# Patient Record
Sex: Female | Born: 1965
Health system: Southern US, Community
[De-identification: ages and names within clinical notes are randomized; demographics above are authoritative.]

## PROBLEM LIST (undated history)

## (undated) DIAGNOSIS — Z91013 Allergy to seafood: Secondary | ICD-10-CM

## (undated) DIAGNOSIS — K859 Acute pancreatitis without necrosis or infection, unspecified: Secondary | ICD-10-CM

## (undated) DIAGNOSIS — IMO0001 Reserved for inherently not codable concepts without codable children: Secondary | ICD-10-CM

## (undated) DIAGNOSIS — N879 Dysplasia of cervix uteri, unspecified: Secondary | ICD-10-CM

## (undated) DIAGNOSIS — G43109 Migraine with aura, not intractable, without status migrainosus: Secondary | ICD-10-CM

## (undated) DIAGNOSIS — R002 Palpitations: Secondary | ICD-10-CM

## (undated) DIAGNOSIS — Z8489 Family history of other specified conditions: Secondary | ICD-10-CM

## (undated) DIAGNOSIS — M839 Adult osteomalacia, unspecified: Secondary | ICD-10-CM

## (undated) DIAGNOSIS — C801 Malignant (primary) neoplasm, unspecified: Secondary | ICD-10-CM

## (undated) DIAGNOSIS — J309 Allergic rhinitis, unspecified: Secondary | ICD-10-CM

## (undated) HISTORY — DX: Palpitations: R00.2

## (undated) HISTORY — DX: Acute pancreatitis without necrosis or infection, unspecified: K85.90

## (undated) HISTORY — DX: Dysplasia of cervix uteri, unspecified: N87.9

## (undated) HISTORY — DX: Allergy to seafood: Z91.013

## (undated) HISTORY — DX: Allergic rhinitis, unspecified: J30.9

## (undated) HISTORY — DX: Migraine with aura, not intractable, without status migrainosus: G43.109

## (undated) HISTORY — PX: FOOT SURGERY: SHX648

## (undated) HISTORY — DX: Malignant (primary) neoplasm, unspecified: C80.1

## (undated) HISTORY — DX: Adult osteomalacia, unspecified: M83.9

## (undated) HISTORY — DX: Reserved for inherently not codable concepts without codable children: IMO0001

## (undated) HISTORY — PX: OTHER SURGICAL HISTORY: SHX169

---

## 1999-03-29 ENCOUNTER — Other Ambulatory Visit: Admission: RE | Admit: 1999-03-29 | Discharge: 1999-03-29 | Payer: Self-pay | Admitting: Gynecology

## 1999-04-04 ENCOUNTER — Encounter: Admission: RE | Admit: 1999-04-04 | Discharge: 1999-07-03 | Payer: Self-pay | Admitting: Gynecology

## 1999-08-09 ENCOUNTER — Encounter: Admission: RE | Admit: 1999-08-09 | Discharge: 1999-11-07 | Payer: Self-pay | Admitting: Gynecology

## 1999-08-30 ENCOUNTER — Inpatient Hospital Stay (HOSPITAL_COMMUNITY): Admission: AD | Admit: 1999-08-30 | Discharge: 1999-09-01 | Payer: Self-pay | Admitting: Gynecology

## 1999-08-31 ENCOUNTER — Encounter: Payer: Self-pay | Admitting: Obstetrics and Gynecology

## 1999-10-24 ENCOUNTER — Inpatient Hospital Stay (HOSPITAL_COMMUNITY): Admission: AD | Admit: 1999-10-24 | Discharge: 1999-10-26 | Payer: Self-pay | Admitting: Gynecology

## 1999-12-13 ENCOUNTER — Other Ambulatory Visit: Admission: RE | Admit: 1999-12-13 | Discharge: 1999-12-13 | Payer: Self-pay | Admitting: Gynecology

## 2000-07-22 ENCOUNTER — Ambulatory Visit (HOSPITAL_BASED_OUTPATIENT_CLINIC_OR_DEPARTMENT_OTHER): Admission: RE | Admit: 2000-07-22 | Discharge: 2000-07-22 | Payer: Self-pay | Admitting: Orthopedic Surgery

## 2000-07-22 ENCOUNTER — Encounter (INDEPENDENT_AMBULATORY_CARE_PROVIDER_SITE_OTHER): Payer: Self-pay | Admitting: *Deleted

## 2000-12-30 ENCOUNTER — Other Ambulatory Visit: Admission: RE | Admit: 2000-12-30 | Discharge: 2000-12-30 | Payer: Self-pay | Admitting: Gynecology

## 2001-11-03 ENCOUNTER — Encounter: Payer: Self-pay | Admitting: Family Medicine

## 2001-11-03 ENCOUNTER — Encounter: Admission: RE | Admit: 2001-11-03 | Discharge: 2001-11-03 | Payer: Self-pay | Admitting: Family Medicine

## 2002-01-12 ENCOUNTER — Other Ambulatory Visit: Admission: RE | Admit: 2002-01-12 | Discharge: 2002-01-12 | Payer: Self-pay | Admitting: Gynecology

## 2002-07-07 ENCOUNTER — Encounter: Admission: RE | Admit: 2002-07-07 | Discharge: 2002-07-07 | Payer: Self-pay | Admitting: Family Medicine

## 2002-07-07 ENCOUNTER — Encounter: Payer: Self-pay | Admitting: Family Medicine

## 2002-11-12 ENCOUNTER — Encounter: Admission: RE | Admit: 2002-11-12 | Discharge: 2002-11-12 | Payer: Self-pay | Admitting: Family Medicine

## 2002-11-12 ENCOUNTER — Encounter: Payer: Self-pay | Admitting: Family Medicine

## 2003-02-03 ENCOUNTER — Other Ambulatory Visit: Admission: RE | Admit: 2003-02-03 | Discharge: 2003-02-03 | Payer: Self-pay | Admitting: Gynecology

## 2004-02-16 ENCOUNTER — Other Ambulatory Visit: Admission: RE | Admit: 2004-02-16 | Discharge: 2004-02-16 | Payer: Self-pay | Admitting: Gynecology

## 2005-02-27 ENCOUNTER — Other Ambulatory Visit: Admission: RE | Admit: 2005-02-27 | Discharge: 2005-02-27 | Payer: Self-pay | Admitting: Gynecology

## 2005-03-29 ENCOUNTER — Encounter: Admission: RE | Admit: 2005-03-29 | Discharge: 2005-03-29 | Payer: Self-pay | Admitting: Gynecology

## 2005-08-26 ENCOUNTER — Other Ambulatory Visit: Admission: RE | Admit: 2005-08-26 | Discharge: 2005-08-26 | Payer: Self-pay | Admitting: Gynecology

## 2005-09-23 DIAGNOSIS — N879 Dysplasia of cervix uteri, unspecified: Secondary | ICD-10-CM

## 2005-09-23 HISTORY — DX: Dysplasia of cervix uteri, unspecified: N87.9

## 2006-02-28 ENCOUNTER — Other Ambulatory Visit: Admission: RE | Admit: 2006-02-28 | Discharge: 2006-02-28 | Payer: Self-pay | Admitting: Gynecology

## 2006-07-07 ENCOUNTER — Ambulatory Visit (HOSPITAL_COMMUNITY): Admission: RE | Admit: 2006-07-07 | Discharge: 2006-07-07 | Payer: Self-pay | Admitting: Family Medicine

## 2006-07-18 ENCOUNTER — Ambulatory Visit (HOSPITAL_COMMUNITY): Admission: RE | Admit: 2006-07-18 | Discharge: 2006-07-18 | Payer: Self-pay | Admitting: Family Medicine

## 2007-03-02 ENCOUNTER — Other Ambulatory Visit: Admission: RE | Admit: 2007-03-02 | Discharge: 2007-03-02 | Payer: Self-pay | Admitting: Gynecology

## 2007-10-02 ENCOUNTER — Other Ambulatory Visit: Admission: RE | Admit: 2007-10-02 | Discharge: 2007-10-02 | Payer: Self-pay | Admitting: Gynecology

## 2007-10-09 ENCOUNTER — Ambulatory Visit (HOSPITAL_COMMUNITY): Admission: RE | Admit: 2007-10-09 | Discharge: 2007-10-09 | Payer: Self-pay | Admitting: Gynecology

## 2007-10-27 ENCOUNTER — Emergency Department (HOSPITAL_COMMUNITY): Admission: EM | Admit: 2007-10-27 | Discharge: 2007-10-27 | Payer: Self-pay | Admitting: Emergency Medicine

## 2008-03-29 ENCOUNTER — Other Ambulatory Visit: Admission: RE | Admit: 2008-03-29 | Discharge: 2008-03-29 | Payer: Self-pay | Admitting: Gynecology

## 2008-11-07 ENCOUNTER — Encounter
Admission: RE | Admit: 2008-11-07 | Discharge: 2008-11-24 | Payer: Self-pay | Admitting: Physical Medicine & Rehabilitation

## 2008-11-08 ENCOUNTER — Ambulatory Visit: Payer: Self-pay | Admitting: Physical Medicine & Rehabilitation

## 2008-11-18 ENCOUNTER — Encounter: Admission: RE | Admit: 2008-11-18 | Discharge: 2008-11-18 | Payer: Self-pay | Admitting: Gynecology

## 2008-11-24 ENCOUNTER — Ambulatory Visit: Payer: Self-pay | Admitting: Physical Medicine & Rehabilitation

## 2009-02-14 ENCOUNTER — Encounter
Admission: RE | Admit: 2009-02-14 | Discharge: 2009-02-16 | Payer: Self-pay | Admitting: Physical Medicine & Rehabilitation

## 2009-02-16 ENCOUNTER — Ambulatory Visit: Payer: Self-pay | Admitting: Physical Medicine & Rehabilitation

## 2009-03-30 ENCOUNTER — Ambulatory Visit: Payer: Self-pay | Admitting: Gynecology

## 2009-03-30 ENCOUNTER — Encounter: Payer: Self-pay | Admitting: Gynecology

## 2009-03-30 ENCOUNTER — Other Ambulatory Visit: Admission: RE | Admit: 2009-03-30 | Discharge: 2009-03-30 | Payer: Self-pay | Admitting: Gynecology

## 2009-05-25 ENCOUNTER — Encounter
Admission: RE | Admit: 2009-05-25 | Discharge: 2009-05-30 | Payer: Self-pay | Admitting: Physical Medicine & Rehabilitation

## 2009-05-30 ENCOUNTER — Ambulatory Visit: Payer: Self-pay | Admitting: Physical Medicine & Rehabilitation

## 2009-06-16 ENCOUNTER — Encounter (INDEPENDENT_AMBULATORY_CARE_PROVIDER_SITE_OTHER): Payer: Self-pay | Admitting: Orthopedic Surgery

## 2009-06-16 ENCOUNTER — Ambulatory Visit (HOSPITAL_BASED_OUTPATIENT_CLINIC_OR_DEPARTMENT_OTHER): Admission: RE | Admit: 2009-06-16 | Discharge: 2009-06-16 | Payer: Self-pay | Admitting: Orthopedic Surgery

## 2009-06-19 ENCOUNTER — Ambulatory Visit (HOSPITAL_COMMUNITY): Admission: RE | Admit: 2009-06-19 | Discharge: 2009-06-19 | Payer: Self-pay | Admitting: Family Medicine

## 2009-11-17 ENCOUNTER — Encounter
Admission: RE | Admit: 2009-11-17 | Discharge: 2009-11-21 | Payer: Self-pay | Admitting: Physical Medicine & Rehabilitation

## 2009-11-20 ENCOUNTER — Ambulatory Visit (HOSPITAL_COMMUNITY): Admission: RE | Admit: 2009-11-20 | Discharge: 2009-11-20 | Payer: Self-pay | Admitting: Gynecology

## 2009-11-21 ENCOUNTER — Ambulatory Visit: Payer: Self-pay | Admitting: Physical Medicine & Rehabilitation

## 2010-04-03 ENCOUNTER — Other Ambulatory Visit: Admission: RE | Admit: 2010-04-03 | Discharge: 2010-04-03 | Payer: Self-pay | Admitting: Gynecology

## 2010-04-03 ENCOUNTER — Ambulatory Visit: Payer: Self-pay | Admitting: Gynecology

## 2010-04-09 ENCOUNTER — Ambulatory Visit: Payer: Self-pay | Admitting: Gynecology

## 2010-10-14 ENCOUNTER — Encounter: Payer: Self-pay | Admitting: Family Medicine

## 2010-10-18 ENCOUNTER — Ambulatory Visit
Admission: RE | Admit: 2010-10-18 | Discharge: 2010-10-18 | Payer: Self-pay | Source: Home / Self Care | Attending: Gynecology | Admitting: Gynecology

## 2010-11-29 ENCOUNTER — Other Ambulatory Visit: Payer: Self-pay | Admitting: Gynecology

## 2010-11-29 DIAGNOSIS — Z1231 Encounter for screening mammogram for malignant neoplasm of breast: Secondary | ICD-10-CM

## 2010-12-12 ENCOUNTER — Ambulatory Visit (HOSPITAL_COMMUNITY)
Admission: RE | Admit: 2010-12-12 | Discharge: 2010-12-12 | Disposition: A | Discharge status: Home / Self Care | Payer: Managed Care, Other (non HMO) | Source: Ambulatory Visit | Attending: Gynecology | Admitting: Gynecology

## 2010-12-12 DIAGNOSIS — Z1231 Encounter for screening mammogram for malignant neoplasm of breast: Secondary | ICD-10-CM | POA: Insufficient documentation

## 2011-02-05 NOTE — Consult Note (Signed)
CONSULT REQUESTED:  Tinnie Gens A. Petrinitz, DPM   REASON FOR CONSULTATION:  Consult requested for the evaluation of left  foot pain.   HISTORY:  A 45 year old female who was walking down steps on June 03, 2008, when she inverted her left ankle and had immediate onset of  pain.  She had what sounds like a left fifth metatarsal spiral fracture,  was seen by Orthopedics, placed in a Cam Walker boot for 6-8 weeks and  then a cast shoe for 2 more months.  She still has some swelling when  she is up on it for a long time.  Her main question is why she continues  to have some burning pain as well as constant aching, which is on the  lateral border of her left foot extending into her little toe.  Her pain  is worse toward the end of the day when she has been up on a lot.  Pain  is worse with walking, standing.  She denies any weakness in lower  extremity or any back pain.  She is employed 40 hours a week and has 4  children.   REVIEW OF SYSTEMS:  Positive for numbness and trouble walking.  She has  also seen Podiatry for this.   PAST MEDICAL HISTORY:  Significant for melanoma.   SOCIAL HISTORY:  She is married.  No alcohol or drug abuse.   FAMILY HISTORY:  Unremarkable.   PHYSICAL EXAMINATION:  VITAL SIGNS:  Blood pressure is 104/56, pulse 66,  respirations 18, O2 sat 100% on room air.  GENERAL:  No acute stress.  Orientation x3.  Affect alert.  Gait is  normal.  EXTREMITIES:  She is able heel walk and toe walk.  Extremities show no  evidence edema.  She has some mild tenderness to palpation but only with  more firm pressure on the fifth metatarsal only.  She has no signs of  vasomotor changes.  No nailbed changes.  Her pulses are normal in the  dorsalis pedis and posterior tibialis.  She has mildly reduced sensation  to pinprick over the lateral border of the left foot as well as left  toe.  She has also some mild reduction of sensation on the dorsum of the  foot on the left side  versus the right side.  She has no signs of  intrinsic atrophy.  She has pain with passive inversion of the left  ankle.   IMPRESSION:  Inversion injury with left ankle with paresthesias and  dysesthesias.  I suspect that she has had a stretch injury of a sural  nerve and possibly a superficial peroneal, does not appear to be in the  deep peroneal.  We will need to check EMG and CV to further assess.  If  the nerve conduction studies are normal, we would look into a  tendinopathy or strain as cause for continuing discomfort.   We discussed medication management including anticonvulsants and topical  agents.  She really feels like she could be with pain.  She really wants  to just see what it is and what else can be done for it.   I discussed her treatment plan and she is in agreement.      Erick Colace, M.D.  Electronically Signed     AEK/MedQ  D:11/08/2008 17:44:28  T:11/09/2008 02:11:04  Job #:  [id number]   cc:   Tinnie Gens A. Petrinitz, D.P.M.  Fax: [phone]   Marlowe Kays, M.D.  Fax: [phone]

## 2011-02-05 NOTE — Assessment & Plan Note (Signed)
[name] is a 45 year old female walking down steps on June 03, 2008.  She injured her left ankle with immediate onset of pain.  Fifth metatarsal spiral fracture placed in a cam walker boot 6-8 weeks,  cast shoe for 2 more months.  She continues to have some pain and  aching, lateral border of the foot and to the low toe.  She had an  EMG/NCV, which showed a low amplitude on the left sural compared to  right sural, approximately 50% decreased, even though it fell within the  lower limit of normal.  No peroneal motor effect and no superficial  peroneal effect abnormalities.   She has been doing relatively well on a Lidoderm. 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  She did not fill the Neurontin, really did not want  anything back that could cloud her mind.  She feels that she needs a  broader coverage area for her Lidoderm.  Her average pain is of 7, but  only 4 right now, interferes with activity at a moderate level.  Improves with ice and TENS.  Relief from meds is good.   PHYSICAL EXAMINATION:  VITAL SIGNS:  Her blood pressure is 117/72, pulse  79, respirations 80, and O2 sat 98% on room air.  Her Oswestry score is  26%.  GENERAL:  Well-developed and well-nourished female in no acute stress.  Orientation x3.  Affect is bright and alert.  Gait is normal.  EXTREMITIES:  She is able toe walk and heel walk.  She has no tenderness  to palpation over the foot.  Good pulses.  No skin breakdown.  Good  strength in the foot evertors and invertors, ankle dorsiflexors, and  plantar flexors.  She has mild deficits to pinprick in the lateral  border of the left foot compared to the right foot, not extending into  little toe.  No significant difference on the plantar surface of the  foot.  No deficits in the first dorsal web space.   IMPRESSION:  Sterile neuropathy, due to inversion injury overall except  recovery over  time.  She is still in the time frame where spontaneous  recovery may occur.   PLAN:  1. We will increase her Lidoderm to 2 patches to provide adequate      coverage.  2. I will see her back at the 1-year mark in September 2010 and      consider repeat EMG/NCV, if no significant improvement clinically.      Erick Colace, M.D.  Electronically Signed     AEK/MedQ  D:  02/16/2009 09:50:36  T:  02/16/2009 23:38:59  Job #:  [id number]   cc:   Tinnie Gens A. Petrinitz, D.P.M.  Fax: [phone]

## 2011-02-08 NOTE — Op Note (Signed)
[facility]  Patient:    Laurie Terry, Laurie Terry                  MRN: [id number] Proc. Date: 07/22/00 Adm. Date:  [id number] Attending:  Ronne Binning CC:         Nicki Reaper, M.D.   Operative Report  PREOPERATIVE DIAGNOSIS:  Pigmented area, left index finger.  POSTOPERATIVE DIAGNOSIS:  Pigmented area, left index finger.  OPERATION PERFORMED:  Biopsy nail matrix, left index finger.  SURGEON:  Nicki Reaper, M.D.  ASSISTANTCarolyne Fiscal, RN.  ANESTHESIA:  Metacarpal block.  INDICATIONS FOR PROCEDURE:  The patient is a 45 year old female who was seen with a pigmented area in her left index finger which has not grown out. It is the full length of the nail plate. She has a prior history of having had a similar lesion on her toe which was biopsied and found to be premelanoma.  She is extremely concerned that this may be a similar process.  DESCRIPTION OF PROCEDURE:  The patient was brought to the operating room where a metacarpal block was given with 1% Xylocaine without epinephrine.  She was prepped and draped using Betadine scrub and solution with the left arm free. A Penrose drain was used for a tourniquet control at the base of the finger. The nail plate was removed.  Minimal pigmentation was noted.  An area of the pigmentation in the nail plate was then replaced onto the nail matrix and a biopsy taken longitudinally of the entire nail matrix.  The nail fold was thoroughly inspected.  No significant pigmented areas were identified.  The wound was then closed with interrupted 6-0 chromic sutures.  The nail plate was reapproximated.  The specimen was sent to pathology.  A sterile compressive dressing was applied. The patient tolerated the procedure well adn was discharged home to return to the [facility] [place] in one week on Advil. DD:  07/22/00 TD:  07/22/00 Job: [id number]

## 2011-02-08 NOTE — Assessment & Plan Note (Signed)
Date of last visit Feb 16, 2009.   Laurie Terry returns today.  She is a 45 year old female, who had a  left fifth metatarsal spiral fracture, placed in a cam walker boot,  continues to have burning pain.  She had an EMG/NCV demonstrating  reduced amplitude of the sural response on the left side when compared  to the right side.  She had a greater than 50% decrement.  Remainder of  nerve testing was normal.   She has had no other new medical problems.  She remains functional with  Lidoderm patches, used during the day; however, when she has increased  activity such as when she is traveling, she takes some ibuprofen and ice  for her foot as well.  Her relief from meds is fair to good.   Her blood pressure is 112/71, pulse 75, respirations 18, O2 sat 97% on  room air.  General, no acute stress.  Orientation x3.  Affect alert.  Gait is normal.  She has mild reduction of sensation to light touch as  well as pinprick in the left lateral foot including the left little toe  and to lesser extent the third and fourth toes.  No sensory deficits on  the dorsum of the foot.  She has normal strength in the lower  extremities.  Normal deep tendon reflexes in the lower extremities.  No  evidence of intrinsic atrophy.   IMPRESSION:  Sural neuropathy, chronic.  She is about 1 year post  injury.  I explained that she may have some spontaneous recovery over  the next 6 months and I will see her then should she continue to have  similar symptoms as she does today, we will recheck sural nerve  conduction to see if there is any electrodiagnostic evidence of  improvement.  Also, consider addition of oral agent such as Neurontin on  the days when she has increased activity, although she is concerned that  sedation side effects may interfere with her job.      Erick Colace, M.D.  Electronically Signed     AEK/MedQ  D:  05/30/2009 09:05:57  T:  05/30/2009 22:51:11  Job #:  [id number]   cc:    Tinnie Gens A. Petrinitz, D.P.M.  Fax: [phone]

## 2011-02-08 NOTE — Discharge Summary (Signed)
[facility]  Patient:    Laurie Terry                   MRN: [id number] Adm. Date:  [id number] Disc. Date: [id number] Attending:  Merrily Pew                           Discharge Summary  DISCHARGE DIAGNOSES:          1. Normal spontaneous vaginal delivery at 39 weeks of                                  female infant.                               2. History of precipitous labor and deliveries.  HISTORY OF PRESENT ILLNESS:   Patient is a 45 year old gravida 4, para 3-0-0-0,  with an EDC of October 29, 1999.  She has a history of precipitous labor and deliveries.  LABORATORY DATA:              She is A-positive.  Antibody screen was negative.  She is not immune to rubella.  MSAFP was within normal limits and she was positive for group B strep.  HOSPITAL COURSE AND TREATMENT:  Patient was admitted at 39 weeks in advanced labor. She delivered an Apgar 8/9 female infant weighing 8 pounds, with a second degree midline laceration.  The placenta was spontaneous and intact.  Postpartum course was uncomplicated.  She remained afebrile and had no difficulty voiding.  She was given rubella vaccine prior to discharge and was discharged on her second postpartum day.  DISPOSITION AND FOLLOWUP:     She is to follow up in the office in four to six weeks, continue with prenatal vitamins and Motrin p.r.n. as needed for pain. DD:  12/03/99 TD:  12/03/99 Job: [id number]

## 2011-04-09 ENCOUNTER — Other Ambulatory Visit (HOSPITAL_COMMUNITY)
Admission: RE | Admit: 2011-04-09 | Discharge: 2011-04-09 | Disposition: A | Discharge status: Home / Self Care | Payer: 59 | Source: Ambulatory Visit | Attending: Gynecology | Admitting: Gynecology

## 2011-04-09 ENCOUNTER — Other Ambulatory Visit: Payer: Self-pay | Admitting: Gynecology

## 2011-04-09 ENCOUNTER — Encounter (INDEPENDENT_AMBULATORY_CARE_PROVIDER_SITE_OTHER): Payer: 59 | Admitting: Gynecology

## 2011-04-09 DIAGNOSIS — Z01419 Encounter for gynecological examination (general) (routine) without abnormal findings: Secondary | ICD-10-CM

## 2011-04-09 DIAGNOSIS — Z833 Family history of diabetes mellitus: Secondary | ICD-10-CM

## 2011-04-09 DIAGNOSIS — Z13220 Encounter for screening for lipoid disorders: Secondary | ICD-10-CM

## 2011-04-09 DIAGNOSIS — Z124 Encounter for screening for malignant neoplasm of cervix: Secondary | ICD-10-CM | POA: Insufficient documentation

## 2011-06-14 LAB — I-STAT 8, (EC8 V) (CONVERTED LAB)
BUN: 10
Bicarbonate: 23.6
Chloride: 106
HCT: 44
Hemoglobin: 15
Sodium: 137
pCO2, Ven: 33.7 — ABNORMAL LOW

## 2011-06-14 LAB — POCT CARDIAC MARKERS
CKMB, poc: <1 — ABNORMAL LOW
Operator id: 285491

## 2011-06-14 LAB — POCT I-STAT CREATININE: Creatinine, Ser: 0.8

## 2011-06-14 LAB — B-NATRIURETIC PEPTIDE (CONVERTED LAB): Pro B Natriuretic peptide (BNP): 33

## 2011-11-18 ENCOUNTER — Other Ambulatory Visit: Payer: Self-pay | Admitting: Gynecology

## 2011-11-18 DIAGNOSIS — Z1231 Encounter for screening mammogram for malignant neoplasm of breast: Secondary | ICD-10-CM

## 2011-12-13 ENCOUNTER — Ambulatory Visit (HOSPITAL_COMMUNITY)
Admission: RE | Admit: 2011-12-13 | Discharge: 2011-12-13 | Disposition: A | Discharge status: Home / Self Care | Payer: 59 | Source: Ambulatory Visit | Attending: Gynecology | Admitting: Gynecology

## 2011-12-13 DIAGNOSIS — Z1231 Encounter for screening mammogram for malignant neoplasm of breast: Secondary | ICD-10-CM | POA: Insufficient documentation

## 2011-12-18 ENCOUNTER — Other Ambulatory Visit: Payer: Self-pay | Admitting: *Deleted

## 2011-12-18 DIAGNOSIS — N63 Unspecified lump in unspecified breast: Secondary | ICD-10-CM

## 2011-12-20 ENCOUNTER — Other Ambulatory Visit: Payer: Self-pay | Admitting: Gynecology

## 2011-12-20 ENCOUNTER — Ambulatory Visit
Admission: RE | Admit: 2011-12-20 | Discharge: 2011-12-20 | Disposition: A | Discharge status: Home / Self Care | Payer: 59 | Source: Ambulatory Visit | Attending: Gynecology | Admitting: Gynecology

## 2011-12-20 DIAGNOSIS — N63 Unspecified lump in unspecified breast: Secondary | ICD-10-CM

## 2012-04-13 ENCOUNTER — Ambulatory Visit (INDEPENDENT_AMBULATORY_CARE_PROVIDER_SITE_OTHER): Payer: 59 | Admitting: Gynecology

## 2012-04-13 ENCOUNTER — Encounter: Payer: Self-pay | Admitting: Gynecology

## 2012-04-13 VITALS — BP 116/74 | Ht 65.0 in | Wt 156.0 lb

## 2012-04-13 DIAGNOSIS — C801 Malignant (primary) neoplasm, unspecified: Secondary | ICD-10-CM | POA: Insufficient documentation

## 2012-04-13 DIAGNOSIS — Z01419 Encounter for gynecological examination (general) (routine) without abnormal findings: Secondary | ICD-10-CM

## 2012-04-13 DIAGNOSIS — M8580 Other specified disorders of bone density and structure, unspecified site: Secondary | ICD-10-CM | POA: Insufficient documentation

## 2012-04-13 NOTE — Progress Notes (Signed)
Laurie Terry Settle 12-05-1965 [id number]        46 y.o.  [id number] for annual exam.  Doing well without complaints.  Past medical history,surgical history, medications, allergies, family history and social history were all reviewed and documented in the EPIC chart. ROS:  Was performed and pertinent positives and negatives are included in the history.  Exam: Kim assistant Filed Vitals:   04/13/12 1156  BP: 116/74  Height: 5\' 5"  (1.651 m)  Weight: 156 lb (70.761 kg)   General appearance  Normal Skin grossly normal Head/Neck normal with no cervical or supraclavicular adenopathy thyroid normal Lungs  clear Cardiac RR, without RMG Abdominal  soft, nontender, without masses, organomegaly or hernia Breasts  examined lying and sitting without masses, retractions, discharge or axillary adenopathy. Pelvic  Ext/BUS/vagina  normal   Cervix  normal IUD string visualized  Uterus  anteverted, normal size, shape and contour, midline and mobile nontender   Adnexa  Without masses or tenderness    Anus and perineum  normal   Rectovaginal  normal sphincter tone without palpated masses or tenderness.    Assessment/Plan:  46 y.o. [id number] female for annual exam, amenorrheic with IUD.   1. Mirena IUD. Patient is at the 5 year mark now and needs to have it replaced. Patient knows this and will schedule removal and replacement. Patient understands pregnancy risk if she goes past the time of replacement.  She is amenorrheic with her IUD as she always has been. 2. Patient had mammography March 2013. She'll continue with annual mammography. SBE monthly reviewed. 3. Pap smear. Last Pap smear 2012. No Pap smear done today. Patient has history of low-grade SIL 2007 no treatment rendered and all subsequent Pap smears have been normal. We'll plan less frequent screening every 3-5 years per current screening guidelines. 4. Health maintenance. No blood work was done today. She had a normal lipid profile, glucose and  CBC last year 2012. 5.     Dara Lords MD, 12:46 PM 04/13/2012

## 2012-04-13 NOTE — Patient Instructions (Signed)
Follow up to have Mirena IUD replaced. Otherwise follow up in one year for annual gynecologic exam.

## 2012-04-14 ENCOUNTER — Other Ambulatory Visit: Payer: Self-pay | Admitting: Gynecology

## 2012-04-14 ENCOUNTER — Telehealth: Payer: Self-pay | Admitting: Gynecology

## 2012-04-14 DIAGNOSIS — Z3049 Encounter for surveillance of other contraceptives: Secondary | ICD-10-CM

## 2012-04-14 LAB — URINALYSIS W MICROSCOPIC + REFLEX CULTURE
Bacteria, UA: NONE SEEN
Crystals: NONE SEEN
Glucose, UA: NEGATIVE mg/dL
Ketones, ur: NEGATIVE mg/dL
Leukocytes, UA: NEGATIVE
Nitrite: NEGATIVE
Specific Gravity, Urine: 1.01 (ref 1.005–1.030)
Urobilinogen, UA: 0.2 mg/dL (ref 0.0–1.0)
pH: 7 (ref 5.0–8.0)

## 2012-04-14 MED ORDER — LEVONORGESTREL 20 MCG/24HR IU IUD: INTRAUTERINE_SYSTEM | Freq: Once | INTRAUTERINE | Status: DC

## 2012-04-14 NOTE — Telephone Encounter (Signed)
Patient informed that Mirena IUD, removal and insertion covered with a $25 copayment. She said she will call back to schedule appointment as she was on a call at the time I spoke with her.

## 2012-05-27 ENCOUNTER — Ambulatory Visit: Payer: 59 | Admitting: Gynecology

## 2012-06-03 ENCOUNTER — Ambulatory Visit (INDEPENDENT_AMBULATORY_CARE_PROVIDER_SITE_OTHER): Payer: 59 | Admitting: Gynecology

## 2012-06-03 ENCOUNTER — Encounter: Payer: Self-pay | Admitting: Gynecology

## 2012-06-03 VITALS — BP 104/68

## 2012-06-03 DIAGNOSIS — Z30433 Encounter for removal and reinsertion of intrauterine contraceptive device: Secondary | ICD-10-CM

## 2012-06-03 NOTE — Progress Notes (Signed)
Patient presents for removal and replacement of Mirena IUD. She has read through the booklet, has no contraindications and signed the consent form.  I reviewed the insertional process with her as well as the risks to include infection either immediate or long-term, uterine perforation or migration requiring surgery to remove, other complications such as pain, hormonal side effects and possibilities of failure with subsequent pregnancy.   Exam with [facility] assistant Pelvic: External BUS vagina normal. Cervix normal IUD string visualized. Uterus anteverted normal size shape contour midline mobile nontender. Adnexa without masses or tenderness.  Procedure: The cervix was cleansed with Hibiclens, the IUD string was grasped with a [place] forcep and the old Mirena IUD was removed shown to the patient and discarded.  The anterior lip of the cervix was grasped with a single-tooth tenaculum, the uterus was sounded and a Mirena IUD was placed according to manufacturer's recommendations without difficulty. The strings were trimmed. The patient tolerated well and will follow up in one month for a postinsertional check.  Lot number: [id number]

## 2012-06-03 NOTE — Patient Instructions (Signed)
Intrauterine Device Insertion Most often, an intrauterine device (IUD) is inserted into the uterus to prevent pregnancy. There are 2 types of IUDs available:  Copper IUD. This type of IUD creates an environment that is not favorable to sperm survival. The mechanism of action of the copper IUD is not known for certain. It can stay in place for 10 years.   Hormone IUD. This type of IUD contains the hormone progestin (synthetic progesterone). The progestin thickens the cervical mucus and prevents sperm from entering the uterus, and it also thins the uterine lining. There is no evidence that the hormone IUD prevents implantation. The hormone IUD can stay in place for up to 5 years.  An IUD is the most cost-effective birth control if left in place for the full duration. It may be removed at any time. LET YOUR CAREGIVER KNOW ABOUT:  Sensitivity to metals.   Medicines taken including herbs, eyedrops, over-the-counter medicines, and creams.   Use of steroids (by mouth or creams).   Previous problems with anesthetics or numbing medicine.   Previous gynecological surgery.   History of blood clots or clotting disorders.   Possibility of pregnancy.   Menstrual irregularities.   Concerns regarding unusual vaginal discharge or odors.   Previous experience with an IUD.   Other health problems.  RISKS AND COMPLICATIONS  Accidental puncture (perforation) of the uterus.   Accidental placement of the IUD either in the muscle layer of the uterus (myometrium) or outside the uterus. If this happen, the IUD can be found essentially floating around the bowels. When this happens, the IUD must be taken out surgically.   The IUD may fall out of the uterus (expulsion). This is more common in women who have recently had a child.    Pregnancy in the fallopian tube (ectopic).  BEFORE THE PROCEDURE  Schedule the IUD insertion for when you will have your menstrual period or right after, to make sure you  are not pregnant. Placement of the IUD is better tolerated shortly after a menstrual cycle.   You may need to take tests or be examined to make sure you are not pregnant.   You may be required to take a pregnancy test.   You may be required to get checked for sexually transmitted infections (STIs) prior to placement. Placing an IUD in someone who has an infection can make an infection worse.   You may be given a pain reliever to take 1 or 2 hours before the procedure.   An exam will be performed to determine the size and position of your uterus.   Ask your caregiver about changing or stopping your regular medicines.  PROCEDURE   A tool (speculum) is placed in the vagina. This allows your caregiver to see the lower part of the uterus (cervix).   The cervix is prepped with a medicine that lowers the risk of infection.   You may be given a medicine to numb each side of the cervix (intracervical or paracervical block). This is used to block and control any discomfort with insertion.   A tool (uterine sound) is inserted into the uterus to determine the length of the uterine cavity and the direction the uterus may be tilted.   A slim instrument (IUD inserter) is inserted through the cervical canal and into your uterus.   The IUD is placed in the uterine cavity and the insertion device is removed.   The nylon string that is attached to the IUD, and used for   eventual IUD removal, is trimmed. It is trimmed so that it lays high in the vagina, just outside the cervix.  AFTER THE PROCEDURE  You may have bleeding after the procedure. This is normal. It varies from light spotting for a few days to menstrual-like bleeding.   You may have mild cramping.   Practice checking the string coming out of the cervix to make sure the IUD remains in the uterus. If you cannot feel the string, you should schedule a "string check" with your caregiver.   If you had a hormone IUD inserted, expect that your  period may be lighter or nonexistent within a year's time (though this is not always the case). There may be delayed fertility with the hormone IUD as a result of its progesterone effect. When you are ready to become pregnant, it is suggested to have the IUD removed up to 1 year in advance.   Yearly exams are advised.  Document Released: 05/08/2011 Document Revised: 08/29/2011 Document Reviewed: 05/08/2011 Exit[employer] Patient Information 2012 [employer]. 

## 2012-06-04 ENCOUNTER — Other Ambulatory Visit: Payer: Self-pay | Admitting: Gynecology

## 2012-06-04 DIAGNOSIS — Z3049 Encounter for surveillance of other contraceptives: Secondary | ICD-10-CM

## 2012-07-01 ENCOUNTER — Ambulatory Visit (INDEPENDENT_AMBULATORY_CARE_PROVIDER_SITE_OTHER): Payer: 59 | Admitting: Gynecology

## 2012-07-01 ENCOUNTER — Encounter: Payer: Self-pay | Admitting: Gynecology

## 2012-07-01 DIAGNOSIS — Z30431 Encounter for routine checking of intrauterine contraceptive device: Secondary | ICD-10-CM

## 2012-07-01 NOTE — Patient Instructions (Signed)
Follow up in July 2014 for annual exam

## 2012-07-01 NOTE — Progress Notes (Signed)
Patient presents for IUD insertion will follow up exam. Doing well without complaints.  Exam with [employer] assistant External BUS vagina normal. Cervix normal with IUD string visualized. Uterus normal size midline mobile nontender. Adnexa without masses or tenderness.  Assessment and plan: Normal postinsertional IUD check. Follow up July 2014 for annual exam, sooner as needed.

## 2012-12-07 ENCOUNTER — Other Ambulatory Visit: Payer: Self-pay | Admitting: Gynecology

## 2012-12-16 ENCOUNTER — Ambulatory Visit (HOSPITAL_COMMUNITY)
Admission: RE | Admit: 2012-12-16 | Discharge: 2012-12-16 | Disposition: A | Discharge status: Home / Self Care | Payer: 59 | Source: Ambulatory Visit | Attending: Gynecology | Admitting: Gynecology

## 2012-12-16 DIAGNOSIS — Z1231 Encounter for screening mammogram for malignant neoplasm of breast: Secondary | ICD-10-CM | POA: Insufficient documentation

## 2013-04-14 ENCOUNTER — Ambulatory Visit (INDEPENDENT_AMBULATORY_CARE_PROVIDER_SITE_OTHER): Payer: 59 | Admitting: Gynecology

## 2013-04-14 ENCOUNTER — Encounter: Payer: Self-pay | Admitting: Gynecology

## 2013-04-14 VITALS — BP 112/70 | Ht 65.0 in | Wt 145.0 lb

## 2013-04-14 DIAGNOSIS — Z01419 Encounter for gynecological examination (general) (routine) without abnormal findings: Secondary | ICD-10-CM

## 2013-04-14 DIAGNOSIS — Z13220 Encounter for screening for lipoid disorders: Secondary | ICD-10-CM

## 2013-04-14 DIAGNOSIS — Z30431 Encounter for routine checking of intrauterine contraceptive device: Secondary | ICD-10-CM

## 2013-04-14 LAB — COMPREHENSIVE METABOLIC PANEL
AST: 10 U/L (ref 0–37)
Alkaline Phosphatase: 45 U/L (ref 39–117)
BUN: 12 mg/dL (ref 6–23)
Glucose, Bld: 80 mg/dL (ref 70–99)
Potassium: 4.4 mEq/L (ref 3.5–5.3)
Sodium: 140 mEq/L (ref 135–145)
Total Bilirubin: 0.5 mg/dL (ref 0.3–1.2)
Total Protein: 6.1 g/dL (ref 6.0–8.3)

## 2013-04-14 LAB — CBC WITH DIFFERENTIAL/PLATELET
Basophils Absolute: 0.1 10*3/uL (ref 0.0–0.1)
Basophils Relative: 1 % (ref 0–1)
Eosinophils Absolute: 0.4 10*3/uL (ref 0.0–0.7)
Hemoglobin: 13.5 g/dL (ref 12.0–15.0)
MCH: 29.5 pg (ref 26.0–34.0)
MCHC: 33.3 g/dL (ref 30.0–36.0)
Monocytes Relative: 10 % (ref 3–12)
Neutro Abs: 3.8 10*3/uL (ref 1.7–7.7)
Neutrophils Relative %: 58 % (ref 43–77)
RDW: 14.4 % (ref 11.5–15.5)

## 2013-04-14 LAB — LIPID PANEL
HDL: 41 mg/dL (ref >39)
LDL Cholesterol: 87 mg/dL (ref 0–99)
Triglycerides: 95 mg/dL (ref <150)
VLDL: 19 mg/dL (ref 0–40)

## 2013-04-14 NOTE — Patient Instructions (Signed)
Followup for a vitamin D level. If low then you need to be on 09-1998 units daily. Followup in one year for annual exam.

## 2013-04-14 NOTE — Progress Notes (Signed)
Laurie Terry [birth year] [id number]        47 y.o.  [id number] for annual exam.  Doing well without complaints.  Past medical history,surgical history, medications, allergies, family history and social history were all reviewed and documented in the EPIC chart.  ROS:  Performed and pertinent positives and negatives are included in the history, assessment and plan .  Exam: Kim assistant Filed Vitals:   04/14/13 0854  BP: 112/70  Height: 5\' 5"  (1.651 m)  Weight: 145 lb (65.772 kg)   General appearance  Normal Skin grossly normal Head/Neck normal with no cervical or supraclavicular adenopathy thyroid normal Lungs  clear Cardiac RR, without RMG Abdominal  soft, nontender, without masses, organomegaly or hernia Breasts  examined lying and sitting without masses, retractions, discharge or axillary adenopathy. Pelvic  Ext/BUS/vagina  normal   Cervix  normal with IUD string visualized with the colposcope and endocervical speculum within cervical canal  Uterus  anteverted, normal size, shape and contour, midline and mobile nontender   Adnexa  Without masses or tenderness    Anus and perineum  normal   Rectovaginal  normal sphincter tone without palpated masses or tenderness.    Assessment/Plan:  47 y.o. [name] female for annual exam.   1. Mirena IUD 05/2012. String visualized within the endocervical canal. Amenorrheic, doing well. 2. Was diagnosed with osteopenia by other M.D. after DEXA done because of amenorrhea with IUD. T score showed -1 to -2 range but Z score normal consistent with normal bone mass for age. No further evaluation or followup needed. 3. History of vitamin D deficiency. Check vitamin D today. Patient does admit to not taking vitamin D routinely I recommended one to 2000 units daily supplement if her vitamin D level is low. She followup for these results. 4. Mammography 11/2012. Continue with annual mammography. SBE monthly reviewed. 5. Pap smear 2012. No Pap smear  done today. History of LGSIL 2007 expectantly followed with normal Pap smears afterwards. Plan repeat Pap smear next year a 3 year interval. 6. Health maintenance. Baseline CBC comprehensive metabolic panel lipid profile urinalysis ordered along with vitamin D. Followup one year, sooner as needed.  Note: This document was prepared with digital dictation and possible smart phrase technology. Any transcriptional errors that result from this process are unintentional.   Dara Lords MD, 9:17 AM 04/14/2013

## 2013-04-15 LAB — URINALYSIS W MICROSCOPIC + REFLEX CULTURE
Bacteria, UA: NONE SEEN
Hgb urine dipstick: NEGATIVE
Ketones, ur: NEGATIVE mg/dL
Nitrite: NEGATIVE
Urobilinogen, UA: 0.2 mg/dL (ref 0.0–1.0)

## 2013-04-15 LAB — VITAMIN D 25 HYDROXY (VIT D DEFICIENCY, FRACTURES): Vit D, 25-Hydroxy: 28 ng/mL — ABNORMAL LOW (ref 30–89)

## 2013-04-16 LAB — URINE CULTURE: Colony Count: >=100000

## 2013-07-29 ENCOUNTER — Other Ambulatory Visit: Payer: Self-pay

## 2013-10-17 ENCOUNTER — Encounter: Payer: Self-pay | Admitting: Cardiology

## 2013-10-17 DIAGNOSIS — R002 Palpitations: Secondary | ICD-10-CM | POA: Insufficient documentation

## 2013-10-25 ENCOUNTER — Encounter: Payer: Self-pay | Admitting: *Deleted

## 2013-10-25 ENCOUNTER — Encounter (INDEPENDENT_AMBULATORY_CARE_PROVIDER_SITE_OTHER): Payer: 59

## 2013-10-25 ENCOUNTER — Encounter: Payer: Self-pay | Admitting: Cardiology

## 2013-10-25 ENCOUNTER — Ambulatory Visit (INDEPENDENT_AMBULATORY_CARE_PROVIDER_SITE_OTHER): Payer: 59 | Admitting: Cardiology

## 2013-10-25 VITALS — BP 116/67 | HR 73 | Ht 65.0 in | Wt 133.0 lb

## 2013-10-25 DIAGNOSIS — R002 Palpitations: Secondary | ICD-10-CM

## 2013-10-25 NOTE — Patient Instructions (Addendum)
Your physician recommends that you continue on your current medications as directed. Please refer to the Current Medication list given to you today.  Your physician has recommended that you wear a 24 holter monitor. Holter monitors are medical devices that record the heart's electrical activity. Doctors most often use these monitors to diagnose arrhythmias. Arrhythmias are problems with the speed or rhythm of the heartbeat. The monitor is a small, portable device. You can wear one while you do your normal daily activities. This is usually used to diagnose what is causing palpitations/syncope (passing out).  Your physician recommends that you follow-up as needed.

## 2013-10-25 NOTE — Progress Notes (Signed)
Patient ID: Laurie Terry, female   DOB: [birth year], 48 y.o.   MRN: [id number] EVO 24 hour holter monitor applied to patient.

## 2013-10-25 NOTE — Progress Notes (Signed)
[facility]. [address] [place],   [address] Phone: [phone] Fax:  [phone]  Date:  10/25/2013   ID:  Laurie Terry, DOB [birth year], MRN [id number]  PCP:  [name], MD   History of Present Illness: Laurie Terry is a 48 y.o. female here for the evaluation of palpitations progress to Dr. Addison Lank. In the past she's had an echocardiogram in her 20's which showed normal function as well as normal thyroid function. She had been experiencing what she describes as "hard "palpitations which seemed to improve with metoprolol 25 mg extended release. Now, mild palpitations occur during. She is actually taking half of the metoprolol tablet. Several years ago diagnosed with PAC's. Over past 8 months, some palps stop her in her tracks. Few episodes feel like they shake her whole body. ?6 seconds. Once had a real bad day of palps and reduced/no caffeine, no Sudafed. No snores. Can feel a little dizzy sometimes.  Father had few strokes in his 77's. Notices more during day when active. Had anxiety in the past. No early family history of heart disease. Former smoker. Hemoglobin 13.3, creatinine 0.6, potassium 4.0.   Wt Readings from Last 3 Encounters:  10/25/13 133 lb (60.328 kg)  04/14/13 145 lb (65.772 kg)  04/13/12 156 lb (70.761 kg)     Past Medical History  Diagnosis Date  . Cervical dysplasia 2007    low-grade SIL, normal Pap smears since then  . IUD     inserted 03/2002.04/06/2007, 06/03/2012  . Cancer     Melanoma  . Osteopenia   . Osteomalacia   . Seafood allergy   . Intermittent palpitations   . Ocular migraine   . Allergic rhinitis   . Anxiety   . Panic attack   . Gestational diabetes     Past Surgical History  Procedure Laterality Date  . Foot surgery      toenail  . Removal of melanoma    . Colposcopy    . Intrauterine device insertion  06/03/2012    Mirena    Current Outpatient Prescriptions  Medication Sig Dispense Refill  .  Adapalene 0.3 % gel As needed      . ALREX 0.2 % SUSP As needed      . fluticasone (FLONASE) 50 MCG/ACT nasal spray As needed      . levonorgestrel (MIRENA) 20 MCG/24HR IUD 1 each by Intrauterine route once. Inserted 04/06/07.       Marland Kitchen metoprolol succinate (TOPROL-XL) 25 MG 24 hr tablet 1 tab daily       Current Facility-Administered Medications  Medication Dose Route Frequency Provider Last Rate Last Dose  . levonorgestrel (MIRENA) 20 MCG/24HR IUD   Intrauterine Once [name], MD        Allergies:    Allergies  Allergen Reactions  . Ampicillin   . Iodine   . Levofloxacin   . Other Hives and Swelling    Seafood  . Septra [Bactrim]     Social History:  The patient  reports that she has quit smoking. She does not have any smokeless tobacco history on file. She reports that she drinks alcohol. She reports that she does not use illicit drugs.   Family History  Problem Relation Age of Onset  . Breast cancer Sister 76  . Arthritis Sister     Psoriatic  . Stroke Father   . Autoimmune disease Father   . CVA Father   . Breast cancer  Maternal Grandmother     Age 42  . Tremor Mother     ROS:  Please see the history of present illness.   Denies any syncope, stroke like symptoms, orthopnea, PND, rash, joint abnormalities, chest pain.   All other systems reviewed and negative.   PHYSICAL EXAM: VS:  BP 116/67  Pulse 73  Ht 5\' 5"  (1.651 m)  Wt 133 lb (60.328 kg)  BMI 22.13 kg/m2 Well nourished, well developed, in no acute distress HEENT: normal, [place]/AT, EOMI Neck: no JVD, normal carotid upstroke, no bruit Cardiac:  normal S1, S2; RRR; no murmur Lungs:  clear to auscultation bilaterally, no wheezing, rhonchi or rales Abd: soft, nontender, no hepatomegaly, no bruits Ext: no edema, 2+ distal pulses Skin: warm and dry GU: deferred Neuro: no focal abnormalities noted, AAO x 3  EKG:  Sinus rhythm rate 73 with no other abnormalities. Mildly reduced voltage on EKG.       ASSESSMENT AND PLAN:  Palpitations-improved in the past with metoprolol. No high-risk symptoms such as syncope. Her father had atrial fibrillation undiagnosed and had a stroke in his 32s. I will check a 48 hour holter monitor to ensure that she does not have any evidence of atrial fibrillation. In the meantime, continue with metoprolol. Electrolytes normal. Thyroid normal. No murmur.   Signed, Candee Furbish, MD [facility]  10/25/2013 8:48 AM

## 2013-11-12 ENCOUNTER — Telehealth: Payer: Self-pay | Admitting: Cardiology

## 2013-11-12 NOTE — Telephone Encounter (Signed)
New message     Want monitor results 

## 2013-11-15 ENCOUNTER — Telehealth: Payer: Self-pay | Admitting: Cardiology

## 2013-11-15 NOTE — Telephone Encounter (Signed)
Monitor Results 

## 2013-11-15 NOTE — Telephone Encounter (Signed)
Left message on patients voicemail to call the office.

## 2013-11-17 NOTE — Telephone Encounter (Signed)
Follow up  ° ° ° °Returning call back to nurse  °

## 2013-11-19 ENCOUNTER — Other Ambulatory Visit: Payer: Self-pay | Admitting: Gynecology

## 2013-11-19 DIAGNOSIS — Z1231 Encounter for screening mammogram for malignant neoplasm of breast: Secondary | ICD-10-CM

## 2013-11-19 NOTE — Telephone Encounter (Signed)
Spoke with patient advised that there was no signs of Atrial Fib. With 0 pauses. Occassional PVC's and PAC's. Continue metoprolol. Patient verbalized understanding.

## 2013-12-17 ENCOUNTER — Ambulatory Visit (HOSPITAL_COMMUNITY)
Admission: RE | Admit: 2013-12-17 | Discharge: 2013-12-17 | Disposition: A | Discharge status: Home / Self Care | Payer: 59 | Source: Ambulatory Visit | Attending: Gynecology | Admitting: Gynecology

## 2013-12-17 DIAGNOSIS — Z1231 Encounter for screening mammogram for malignant neoplasm of breast: Secondary | ICD-10-CM | POA: Insufficient documentation

## 2014-04-28 ENCOUNTER — Ambulatory Visit (INDEPENDENT_AMBULATORY_CARE_PROVIDER_SITE_OTHER): Payer: 59 | Admitting: Gynecology

## 2014-04-28 ENCOUNTER — Encounter: Payer: Self-pay | Admitting: Gynecology

## 2014-04-28 ENCOUNTER — Other Ambulatory Visit (HOSPITAL_COMMUNITY)
Admission: RE | Admit: 2014-04-28 | Discharge: 2014-04-28 | Disposition: A | Discharge status: Home / Self Care | Payer: 59 | Source: Ambulatory Visit | Attending: Gynecology | Admitting: Gynecology

## 2014-04-28 VITALS — BP 116/74 | Ht 65.0 in | Wt 138.0 lb

## 2014-04-28 DIAGNOSIS — E559 Vitamin D deficiency, unspecified: Secondary | ICD-10-CM

## 2014-04-28 DIAGNOSIS — Z30431 Encounter for routine checking of intrauterine contraceptive device: Secondary | ICD-10-CM

## 2014-04-28 DIAGNOSIS — Z01419 Encounter for gynecological examination (general) (routine) without abnormal findings: Secondary | ICD-10-CM | POA: Insufficient documentation

## 2014-04-28 DIAGNOSIS — Z1151 Encounter for screening for human papillomavirus (HPV): Secondary | ICD-10-CM | POA: Diagnosis present

## 2014-04-28 LAB — CBC WITH DIFFERENTIAL/PLATELET
Basophils Absolute: 0.1 10*3/uL (ref 0.0–0.1)
Basophils Relative: 1 % (ref 0–1)
EOS PCT: 6 % — AB (ref 0–5)
Eosinophils Absolute: 0.4 10*3/uL (ref 0.0–0.7)
HEMATOCRIT: 38.5 % (ref 36.0–46.0)
HEMOGLOBIN: 13.1 g/dL (ref 12.0–15.0)
LYMPHS ABS: 1.6 10*3/uL (ref 0.7–4.0)
LYMPHS PCT: 22 % (ref 12–46)
MCH: 29.6 pg (ref 26.0–34.0)
MCHC: 34 g/dL (ref 30.0–36.0)
MCV: 86.9 fL (ref 78.0–100.0)
MONO ABS: 0.7 10*3/uL (ref 0.1–1.0)
Monocytes Relative: 10 % (ref 3–12)
NEUTROS ABS: 4.4 10*3/uL (ref 1.7–7.7)
Neutrophils Relative %: 61 % (ref 43–77)
Platelets: 280 10*3/uL (ref 150–400)
RBC: 4.43 MIL/uL (ref 3.87–5.11)
RDW: 13.8 % (ref 11.5–15.5)
WBC: 7.2 10*3/uL (ref 4.0–10.5)

## 2014-04-28 LAB — COMPREHENSIVE METABOLIC PANEL
ALBUMIN: 3.9 g/dL (ref 3.5–5.2)
ALT: 11 U/L (ref 0–35)
AST: 10 U/L (ref 0–37)
Alkaline Phosphatase: 38 U/L — ABNORMAL LOW (ref 39–117)
BUN: 13 mg/dL (ref 6–23)
CALCIUM: 9.6 mg/dL (ref 8.4–10.5)
CHLORIDE: 101 meq/L (ref 96–112)
CO2: 31 meq/L (ref 19–32)
CREATININE: 0.64 mg/dL (ref 0.50–1.10)
Glucose, Bld: 72 mg/dL (ref 70–99)
POTASSIUM: 4 meq/L (ref 3.5–5.3)
Sodium: 139 mEq/L (ref 135–145)
Total Bilirubin: 0.5 mg/dL (ref 0.2–1.2)
Total Protein: 6.4 g/dL (ref 6.0–8.3)

## 2014-04-28 LAB — LIPID PANEL
CHOL/HDL RATIO: 2.5 ratio
CHOLESTEROL: 147 mg/dL (ref 0–200)
HDL: 59 mg/dL (ref >39)
LDL Cholesterol: 71 mg/dL (ref 0–99)
Triglycerides: 85 mg/dL (ref <150)
VLDL: 17 mg/dL (ref 0–40)

## 2014-04-28 NOTE — Progress Notes (Signed)
Laurie Terry January 05, 1966 [id number]        48 y.o.  [id number] for annual exam.  Several issues noted below.  Past medical history,surgical history, problem list, medications, allergies, family history and social history were all reviewed and documented as reviewed in the EPIC chart.  ROS:  12 system ROS performed with pertinent positives and negatives included in the history, assessment and plan.   Additional significant findings :  None   Exam: Kim Counsellor Vitals:   04/28/14 0825  BP: 116/74  Height: 5\' 5"  (1.651 m)  Weight: 138 lb (62.596 kg)   General appearance:  Normal affect, orientation and appearance. Skin: Grossly normal HEENT: Without gross lesions.  No cervical or supraclavicular adenopathy. Thyroid normal.  Lungs:  Clear without wheezing, rales or rhonchi Cardiac: RR, without RMG Abdominal:  Soft, nontender, without masses, guarding, rebound, organomegaly or hernia Breasts:  Examined lying and sitting without masses, retractions, discharge or axillary adenopathy. Pelvic:  Ext/BUS/vagina normal  Cervix normal. IUD string visualized. Pap/HPV  Uterus anteverted, normal size, shape and contour, midline and mobile nontender   Adnexa  Without masses or tenderness    Anus and perineum  Normal   Rectovaginal  Normal sphincter tone without palpated masses or tenderness.    Assessment/Plan:  48 y.o. [id number] female for annual exam without menses, Mirena IUD.   1. Mirena IUD 2013. Doing well without menses. IUD strings visualized. Continue to monitor. 2. DEXA 2010 reportedly showed osteopenia with T score in the -1 to -2 range. Her Z score was normal consistent with normal bone mass for age. Will follow and plan baseline DEXA into the menopause. Is known to have marginal vitamin D levels. Check vitamin D level today. Vitamin D supplementation again encouraged. 3. Pap smear 2012. Pap/HPV today. History of LGSIL 2007 with normal Pap smears since. Plan repeat Pap smear  in 3-5 your interval assuming normal today per current screening guidelines. 4. Mammography 11/2013. Continue with annual mammography. SBE monthly reviewed. 5. Health maintenance. Baseline CBC comprehensive metabolic panel lipid profile urinalysis ordered. Followup in one year, sooner as needed.   Note: This document was prepared with digital dictation and possible smart phrase technology. Any transcriptional errors that result from this process are unintentional.   [name] MD, 8:55 AM 04/28/2014     np

## 2014-04-28 NOTE — Patient Instructions (Signed)
Followup in one year for annual exam, sooner if any issues.  You may obtain a copy of any labs that were done today by logging onto MyChart as outlined in the instructions provided with your AVS (after visit summary). The office will not call with normal lab results but certainly if there are any significant abnormalities then we will contact you.   Health Maintenance, Female A healthy lifestyle and preventative care can promote health and wellness.  Maintain regular health, dental, and eye exams.  Eat a healthy diet. Foods like vegetables, fruits, whole grains, low-fat dairy products, and lean protein foods contain the nutrients you need without too many calories. Decrease your intake of foods high in solid fats, added sugars, and salt. Get information about a proper diet from your caregiver, if necessary.  Regular physical exercise is one of the most important things you can do for your health. Most adults should get at least 150 minutes of moderate-intensity exercise (any activity that increases your heart rate and causes you to sweat) each week. In addition, most adults need muscle-strengthening exercises on 2 or more days a week.   Maintain a healthy weight. The body mass index (BMI) is a screening tool to identify possible weight problems. It provides an estimate of body fat based on height and weight. Your caregiver can help determine your BMI, and can help you achieve or maintain a healthy weight. For adults 20 years and older:  A BMI below 18.5 is considered underweight.  A BMI of 18.5 to 24.9 is normal.  A BMI of 25 to 29.9 is considered overweight.  A BMI of 30 and above is considered obese.  Maintain normal blood lipids and cholesterol by exercising and minimizing your intake of saturated fat. Eat a balanced diet with plenty of fruits and vegetables. Blood tests for lipids and cholesterol should begin at age 20 and be repeated every 5 years. If your lipid or cholesterol levels are  high, you are over 50, or you are a high risk for heart disease, you may need your cholesterol levels checked more frequently.Ongoing high lipid and cholesterol levels should be treated with medicines if diet and exercise are not effective.  If you smoke, find out from your caregiver how to quit. If you do not use tobacco, do not start.  Lung cancer screening is recommended for adults aged 55 80 years who are at high risk for developing lung cancer because of a history of smoking. Yearly low-dose computed tomography (CT) is recommended for people who have at least a 30-pack-year history of smoking and are a current smoker or have quit within the past 15 years. A pack year of smoking is smoking an average of 1 pack of cigarettes a day for 1 year (for example: 1 pack a day for 30 years or 2 packs a day for 15 years). Yearly screening should continue until the smoker has stopped smoking for at least 15 years. Yearly screening should also be stopped for people who develop a health problem that would prevent them from having lung cancer treatment.  If you are pregnant, do not drink alcohol. If you are breastfeeding, be very cautious about drinking alcohol. If you are not pregnant and choose to drink alcohol, do not exceed 1 drink per day. One drink is considered to be 12 ounces (355 mL) of beer, 5 ounces (148 mL) of wine, or 1.5 ounces (44 mL) of liquor.  Avoid use of street drugs. Do not share needles with   anyone. Ask for help if you need support or instructions about stopping the use of drugs.  High blood pressure causes heart disease and increases the risk of stroke. Blood pressure should be checked at least every 1 to 2 years. Ongoing high blood pressure should be treated with medicines, if weight loss and exercise are not effective.  If you are 55 to 49 years old, ask your caregiver if you should take aspirin to prevent strokes.  Diabetes screening involves taking a blood sample to check your fasting  blood sugar level. This should be done once every 3 years, after age 45, if you are within normal weight and without risk factors for diabetes. Testing should be considered at a younger age or be carried out more frequently if you are overweight and have at least 1 risk factor for diabetes.  Breast cancer screening is essential preventative care for women. You should practice "breast self-awareness." This means understanding the normal appearance and feel of your breasts and may include breast self-examination. Any changes detected, no matter how small, should be reported to a caregiver. Women in their 20s and 30s should have a clinical breast exam (CBE) by a caregiver as part of a regular health exam every 1 to 3 years. After age 40, women should have a CBE every year. Starting at age 40, women should consider having a mammogram (breast X-ray) every year. Women who have a family history of breast cancer should talk to their caregiver about genetic screening. Women at a high risk of breast cancer should talk to their caregiver about having an MRI and a mammogram every year.  Breast cancer gene (BRCA)-related cancer risk assessment is recommended for women who have family members with BRCA-related cancers. BRCA-related cancers include breast, ovarian, tubal, and peritoneal cancers. Having family members with these cancers may be associated with an increased risk for harmful changes (mutations) in the breast cancer genes BRCA1 and BRCA2. Results of the assessment will determine the need for genetic counseling and BRCA1 and BRCA2 testing.  The Pap test is a screening test for cervical cancer. Women should have a Pap test starting at age 21. Between ages 21 and 29, Pap tests should be repeated every 2 years. Beginning at age 30, you should have a Pap test every 3 years as long as the past 3 Pap tests have been normal. If you had a hysterectomy for a problem that was not cancer or a condition that could lead to  cancer, then you no longer need Pap tests. If you are between ages 65 and 70, and you have had normal Pap tests going back 10 years, you no longer need Pap tests. If you have had past treatment for cervical cancer or a condition that could lead to cancer, you need Pap tests and screening for cancer for at least 20 years after your treatment. If Pap tests have been discontinued, risk factors (such as a new sexual partner) need to be reassessed to determine if screening should be resumed. Some women have medical problems that increase the chance of getting cervical cancer. In these cases, your caregiver may recommend more frequent screening and Pap tests.  The human papillomavirus (HPV) test is an additional test that may be used for cervical cancer screening. The HPV test looks for the virus that can cause the cell changes on the cervix. The cells collected during the Pap test can be tested for HPV. The HPV test could be used to screen women aged 30   years and older, and should be used in women of any age who have unclear Pap test results. After the age of 30, women should have HPV testing at the same frequency as a Pap test.  Colorectal cancer can be detected and often prevented. Most routine colorectal cancer screening begins at the age of 50 and continues through age 75. However, your caregiver may recommend screening at an earlier age if you have risk factors for colon cancer. On a yearly basis, your caregiver may provide home test kits to check for hidden blood in the stool. Use of a small camera at the end of a tube, to directly examine the colon (sigmoidoscopy or colonoscopy), can detect the earliest forms of colorectal cancer. Talk to your caregiver about this at age 50, when routine screening begins. Direct examination of the colon should be repeated every 5 to 10 years through age 75, unless early forms of pre-cancerous polyps or small growths are found.  Hepatitis C blood testing is recommended for  all people born from 1945 through 1965 and any individual with known risks for hepatitis C.  Practice safe sex. Use condoms and avoid high-risk sexual practices to reduce the spread of sexually transmitted infections (STIs). Sexually active women aged 25 and younger should be checked for Chlamydia, which is a common sexually transmitted infection. Older women with new or multiple partners should also be tested for Chlamydia. Testing for other STIs is recommended if you are sexually active and at increased risk.  Osteoporosis is a disease in which the bones lose minerals and strength with aging. This can result in serious bone fractures. The risk of osteoporosis can be identified using a bone density scan. Women ages 65 and over and women at risk for fractures or osteoporosis should discuss screening with their caregivers. Ask your caregiver whether you should be taking a calcium supplement or vitamin D to reduce the rate of osteoporosis.  Menopause can be associated with physical symptoms and risks. Hormone replacement therapy is available to decrease symptoms and risks. You should talk to your caregiver about whether hormone replacement therapy is right for you.  Use sunscreen. Apply sunscreen liberally and repeatedly throughout the day. You should seek shade when your shadow is shorter than you. Protect yourself by wearing long sleeves, pants, a wide-brimmed hat, and sunglasses year round, whenever you are outdoors.  Notify your caregiver of new moles or changes in moles, especially if there is a change in shape or color. Also notify your caregiver if a mole is larger than the size of a pencil eraser.  Stay current with your immunizations. Document Released: 03/25/2011 Document Revised: 01/04/2013 Document Reviewed: 03/25/2011 [employer] Patient Information 2014 [employer].   

## 2014-04-28 NOTE — Addendum Note (Signed)
Addended by: Nelva Nay on: 04/28/2014 09:02 AM   Modules accepted: Orders

## 2014-04-29 LAB — URINALYSIS W MICROSCOPIC + REFLEX CULTURE
BILIRUBIN URINE: NEGATIVE
Bacteria, UA: NONE SEEN
CASTS: NONE SEEN
CRYSTALS: NONE SEEN
GLUCOSE, UA: NEGATIVE mg/dL
Hgb urine dipstick: NEGATIVE
Ketones, ur: NEGATIVE mg/dL
LEUKOCYTES UA: NEGATIVE
Nitrite: NEGATIVE
PH: 6.5 (ref 5.0–8.0)
PROTEIN: NEGATIVE mg/dL
SQUAMOUS EPITHELIAL / LPF: NONE SEEN
Specific Gravity, Urine: 1.007 (ref 1.005–1.030)
Urobilinogen, UA: 0.2 mg/dL (ref 0.0–1.0)

## 2014-05-02 LAB — CYTOLOGY - PAP

## 2014-07-25 ENCOUNTER — Encounter: Payer: Self-pay | Admitting: Gynecology

## 2014-11-14 ENCOUNTER — Other Ambulatory Visit: Payer: Self-pay | Admitting: Gynecology

## 2014-11-14 DIAGNOSIS — Z1231 Encounter for screening mammogram for malignant neoplasm of breast: Secondary | ICD-10-CM

## 2014-11-16 ENCOUNTER — Telehealth: Payer: Self-pay | Admitting: *Deleted

## 2014-11-16 NOTE — Telephone Encounter (Signed)
3-D mammography claims less recalls for shadows and possibly an earlier pickup rate for early cancers.  I think given her sister's history it would be prudent to do the 3-D. Usually it cost $50 more to do this.

## 2014-11-16 NOTE — Telephone Encounter (Signed)
Left the below on pt voicemail, per DPR access okay to leave message.

## 2014-11-16 NOTE — Telephone Encounter (Signed)
Pt is due to have yearly mammogram asked if 3D mammogram should be scheduled? Pt said her past breast imaging results have been fine, no breast problems. Sister has history of breast cancer, called her insurance company and they said 3D is not covered. Pt would like your recommendation if screening or 3D should be scheduled? Please advise

## 2014-12-28 ENCOUNTER — Other Ambulatory Visit: Payer: Self-pay | Admitting: Gynecology

## 2014-12-28 ENCOUNTER — Ambulatory Visit (HOSPITAL_COMMUNITY)
Admission: RE | Admit: 2014-12-28 | Discharge: 2014-12-28 | Disposition: A | Discharge status: Home / Self Care | Payer: 59 | Source: Ambulatory Visit | Attending: Gynecology | Admitting: Gynecology

## 2014-12-28 DIAGNOSIS — Z1231 Encounter for screening mammogram for malignant neoplasm of breast: Secondary | ICD-10-CM | POA: Diagnosis present

## 2015-03-29 ENCOUNTER — Other Ambulatory Visit: Payer: Self-pay | Admitting: Family Medicine

## 2015-03-29 ENCOUNTER — Ambulatory Visit
Admission: RE | Admit: 2015-03-29 | Discharge: 2015-03-29 | Disposition: A | Discharge status: Home / Self Care | Payer: 59 | Source: Ambulatory Visit | Attending: Family Medicine | Admitting: Family Medicine

## 2015-03-29 DIAGNOSIS — R059 Cough, unspecified: Secondary | ICD-10-CM

## 2015-03-29 DIAGNOSIS — R05 Cough: Secondary | ICD-10-CM

## 2015-03-30 ENCOUNTER — Encounter: Payer: Self-pay | Admitting: Women's Health

## 2015-03-30 ENCOUNTER — Ambulatory Visit (INDEPENDENT_AMBULATORY_CARE_PROVIDER_SITE_OTHER): Payer: 59 | Admitting: Women's Health

## 2015-03-30 ENCOUNTER — Ambulatory Visit: Payer: Self-pay | Admitting: Gynecology

## 2015-03-30 VITALS — BP 124/80 | Ht 65.0 in | Wt 147.0 lb

## 2015-03-30 DIAGNOSIS — R350 Frequency of micturition: Secondary | ICD-10-CM

## 2015-03-30 DIAGNOSIS — N898 Other specified noninflammatory disorders of vagina: Secondary | ICD-10-CM | POA: Diagnosis not present

## 2015-03-30 DIAGNOSIS — N3000 Acute cystitis without hematuria: Secondary | ICD-10-CM

## 2015-03-30 LAB — URINALYSIS W MICROSCOPIC + REFLEX CULTURE
Bilirubin Urine: NEGATIVE
Casts: NONE SEEN
Crystals: NONE SEEN
GLUCOSE, UA: NEGATIVE mg/dL
Ketones, ur: NEGATIVE mg/dL
NITRITE: NEGATIVE
PROTEIN: NEGATIVE mg/dL
Specific Gravity, Urine: <1.005 — ABNORMAL LOW (ref 1.005–1.030)
UROBILINOGEN UA: 0.2 mg/dL (ref 0.0–1.0)
pH: 5 (ref 5.0–8.0)

## 2015-03-30 LAB — WET PREP FOR TRICH, YEAST, CLUE
CLUE CELLS WET PREP: NONE SEEN
Trich, Wet Prep: NONE SEEN
Yeast Wet Prep HPF POC: NONE SEEN

## 2015-03-30 MED ORDER — NITROFURANTOIN MONOHYD MACRO 100 MG PO CAPS: 100.0000 mg | ORAL_CAPSULE | Freq: Two times a day (BID) | ORAL | Status: AC

## 2015-03-30 NOTE — Patient Instructions (Signed)

## 2015-03-30 NOTE — Progress Notes (Signed)
Patient ID: Laurie Terry, female   DOB: 04-30-1966, 49 y.o.   MRN: [id number] Presents with complaint of increased urinary frequency with urgency and slight vaginal burning. Denies pain at end of stream or fever. No visible discharge, itching or odor. Mild low abdominal discomfort. Mirena IUD, amenorrheic  Exam: Appears well. Abdomen soft no rebound or radiation. External genitalia within normal limits, speculum exam scant discharge, no visible erythema or odor noted. Wet prep negative. Bimanual no CMT or adnexal tenderness. Mild suprapubic tenderness. UA: Small blood, small leukocytes, 21-50 WBCs, 3-6 RBCs.  UTI  Plan: Macrobid 100 by mouth twice a day for 7 days call if no relief of symptoms. UTI prevention discussed. Urine culture pending.

## 2015-03-31 ENCOUNTER — Encounter: Payer: Self-pay | Admitting: Women's Health

## 2015-03-31 LAB — URINE CULTURE
COLONY COUNT: NO GROWTH
Organism ID, Bacteria: NO GROWTH

## 2015-06-01 ENCOUNTER — Encounter: Payer: Self-pay | Admitting: Gynecology

## 2015-06-01 ENCOUNTER — Ambulatory Visit (INDEPENDENT_AMBULATORY_CARE_PROVIDER_SITE_OTHER): Payer: 59 | Admitting: Gynecology

## 2015-06-01 VITALS — BP 118/80 | Ht 65.0 in | Wt 145.0 lb

## 2015-06-01 DIAGNOSIS — Z30431 Encounter for routine checking of intrauterine contraceptive device: Secondary | ICD-10-CM

## 2015-06-01 DIAGNOSIS — Z01419 Encounter for gynecological examination (general) (routine) without abnormal findings: Secondary | ICD-10-CM

## 2015-06-01 NOTE — Patient Instructions (Signed)

## 2015-06-01 NOTE — Progress Notes (Signed)
Laurie Terry [birth year] [id number]        49 y.o.  [id number] for annual exam.  Doing well without complaints  Past medical history,surgical history, problem list, medications, allergies, family history and social history were all reviewed and documented as reviewed in the EPIC chart.  ROS:  Performed with pertinent positives and negatives included in the history, assessment and plan.   Additional significant findings :  none   Exam: [name] Vitals:   06/01/15 0837  BP: 118/80  Height: 5\' 5"  (1.651 m)  Weight: 145 lb (65.772 kg)   General appearance:  Normal affect, orientation and appearance. Skin: Grossly normal HEENT: Without gross lesions.  No cervical or supraclavicular adenopathy. Thyroid normal.  Lungs:  Clear without wheezing, rales or rhonchi Cardiac: RR, without RMG Abdominal:  Soft, nontender, without masses, guarding, rebound, organomegaly or hernia Breasts:  Examined lying and sitting without masses, retractions, discharge or axillary adenopathy. Pelvic:  Ext/BUS/vagina normal  Cervix normal with IUD string visualized  Uterus anteverted, normal size, shape and contour, midline and mobile nontender   Adnexa  Without masses or tenderness    Anus and perineum  Normal   Rectovaginal  Normal sphincter tone without palpated masses or tenderness.    Assessment/Plan:  49 y.o. [id number] female for annual exam without regular menses, Mirena IUD.   1. Mirena IUD 05/2012. Doing well with occasional spotting. IUD strings visualized.  No symptoms to suggest menopause. 2. Pap smear/HPV negative 2015. No Pap smear done today. History of LGSIL 2007 with normal Pap smears since then. Plan repeat Pap smear in 3-5 year interval. 3. Mammography 12/2014. Continue with annual mammography. SBE monthly reviewed. 4. DEXA 2010. T score was in the minus 1-2 range but Z score was normal which was consistent with normal for her age. Had history of low vitamin D and is  supplementing. Reports recent vitamin D checked at her primary 60 which was normal. Plan repeat DEXA further into the menopause. 5. Health maintenance. No routine lab work done as she reports is done at her primary physician's office. Follow up in one year, sooner as needed.   [name] MD, 9:05 AM 06/01/2015

## 2015-11-22 ENCOUNTER — Other Ambulatory Visit: Payer: Self-pay

## 2015-11-22 DIAGNOSIS — Z1231 Encounter for screening mammogram for malignant neoplasm of breast: Secondary | ICD-10-CM

## 2015-11-24 ENCOUNTER — Ambulatory Visit (INDEPENDENT_AMBULATORY_CARE_PROVIDER_SITE_OTHER): Payer: 59 | Admitting: Internal Medicine

## 2015-11-24 DIAGNOSIS — Z7189 Other specified counseling: Secondary | ICD-10-CM

## 2015-11-24 DIAGNOSIS — Z789 Other specified health status: Secondary | ICD-10-CM

## 2015-11-24 DIAGNOSIS — Z23 Encounter for immunization: Secondary | ICD-10-CM | POA: Diagnosis not present

## 2015-11-24 DIAGNOSIS — Z7184 Encounter for health counseling related to travel: Secondary | ICD-10-CM

## 2015-11-24 MED ORDER — AZITHROMYCIN 500 MG PO TABS: 1000.0000 mg | ORAL_TABLET | Freq: Once | ORAL | Status: DC

## 2015-11-24 NOTE — Patient Instructions (Signed)
[facility]                [address], [place]                   [place], [place] [address]                      Phone: [phone]                        Fax: [phone]   Planned departure date: March 2017          Planned return date: 4 days Countries of travel: [place]    Guidelines for the Prevention & Treatment of Traveler's Diarrhea  Prevention: "Boil it, Peel it, Lacinda Axon it, or Forget it"   the fewer chances -> lower risk: try to stick to food & water precautions as much as possible"   If it's "piping hot"; it is probably okay, if not, it may not be   Treatment   1) You should always take care to drink lots of fluids in order to avoid dehydration   2) You should bring medications with you in case you come down with a case of diarrhea   3) OTC = bring pepto-bismol - can take with initial abdominal symptoms;                    Imodium - can help slow down your intestinal tract, can help relief cramps                    and diarrhea, can take if no bloody diarrhea  Use azithromycin if needed for traveler's diarrhea  Guidelines for the Prevention of Malaria  Avoidance:  -fewer mosquito bites = lower risk. Mosquitos can bite at night as well as daytime  -cover up (long sleeve clothing), mosquito nets, screens  -Insect repellent for your skin ( DEET containing lotion > 20%): for clothes ( permethrin spray)    Immunizations received today: Hepatitis A series, Td and Typhoid (parenteral)  Future immunizations, if indicated Hepatitis A series around September 2017   Prior to travel:  1) Be sure to pick up appropriate prescriptions, including medicine you take daily. Do not expect to be able to fill your prescriptions abroad.  2) Strongly consider obtaining traveler's insurance, including emergency evacuation insurance. Most plans in the [place] do not cover participants abroad. (see below for resources)  3) Register at the  appropriate [place] embassy or consulate with travel dates so they are aware of your presence in-country and for helpful advice during travel using the [employer] (STEP, GreenNylon.com.cy).  4) Leave contact information with a relative or friend.  5) Keep a Research officer, political party, credit cards in case they become lost or stolen  6) Inform your credit card company that you will be travelling abroad   During travel:  1) If you become ill and need medical advice, the [place] [employer]	[place] of the country you are traveling in general provides a list of [place] speaking doctors.  We are also available on MyChart for remote consultation if you register prior to travel. 2) Avoid motorcycles or scooters when at all possible. Traffic laws in many countries are lax and accidents occur frequently.  3) Do not take any unnecessary risks that you wouldn't do at home.   Resources:  -Country specific information:  BlindResource.ca or GreenNylon.com.cy  -Travel Supplies (DEET, mosquito nets): [employer], [employer] store, [employer], [place] insurance options: [place].com; http://clayton-rivera.info/; travelguard.com or [employer], gninsurance.com or info@gninsurance .com, H1235423.   Post Travel:  If you return from your trip ill, call your primary care doctor or our travel clinic @ [phone].   Enjoy your trip and know that with proper pre-travel preparation, most people have an enjoyable and uninterrupted trip!

## 2015-11-24 NOTE — Progress Notes (Signed)
Subjective:   Laurie Terry is a 50 y.o. female who presents to the Infectious Disease clinic for travel consultation. Planned departure date: 3 weeks          Planned return date: 4 days Countries of travel: [place]  Areas in country: urban   Accommodations: private home Purpose of travel: vacation Prior travel out of [place]: yes     Objective:   Medications: reviewed    Assessment:    No contraindications to travel. none     Plan:    Issues discussed: environmental concerns, freshwater swimming, future shots, insect-borne illnesses, Japanese encephalitis, malaria, MVA safety, rabies, safe food/water, traveler's diarrhea, website/handouts for more information, what to do if ill upon return, what to do if ill while there and Yellow Fever. Immunizations recommended: Hepatitis A series, Td and Typhoid (parenteral). Malaria prophylaxis: not indicated Traveler's diarrhea prophylaxis: azithromycin. Total duration of visit: 1 Hour. Total time spent on education, counseling, coordination of care: 30 Minutes.

## 2016-01-25 ENCOUNTER — Ambulatory Visit: Payer: 59

## 2016-02-20 ENCOUNTER — Ambulatory Visit
Admission: RE | Admit: 2016-02-20 | Discharge: 2016-02-20 | Disposition: A | Discharge status: Home / Self Care | Payer: 59 | Source: Ambulatory Visit

## 2016-02-20 DIAGNOSIS — Z1231 Encounter for screening mammogram for malignant neoplasm of breast: Secondary | ICD-10-CM

## 2016-03-29 ENCOUNTER — Other Ambulatory Visit: Payer: Self-pay | Admitting: Gastroenterology

## 2016-04-06 ENCOUNTER — Emergency Department (HOSPITAL_COMMUNITY): Payer: 59

## 2016-04-06 ENCOUNTER — Inpatient Hospital Stay (HOSPITAL_COMMUNITY)
Admission: EM | Admit: 2016-04-06 | Discharge: 2016-04-11 | DRG: 417 | Disposition: A | Discharge status: Home / Self Care | Payer: 59 | Attending: Internal Medicine | Admitting: Internal Medicine

## 2016-04-06 ENCOUNTER — Encounter (HOSPITAL_COMMUNITY): Payer: Self-pay | Admitting: Emergency Medicine

## 2016-04-06 DIAGNOSIS — R7989 Other specified abnormal findings of blood chemistry: Secondary | ICD-10-CM

## 2016-04-06 DIAGNOSIS — K8063 Calculus of gallbladder and bile duct with acute cholecystitis with obstruction: Principal | ICD-10-CM | POA: Diagnosis present

## 2016-04-06 DIAGNOSIS — Z9049 Acquired absence of other specified parts of digestive tract: Secondary | ICD-10-CM | POA: Insufficient documentation

## 2016-04-06 DIAGNOSIS — Z91013 Allergy to seafood: Secondary | ICD-10-CM

## 2016-04-06 DIAGNOSIS — Z79899 Other long term (current) drug therapy: Secondary | ICD-10-CM

## 2016-04-06 DIAGNOSIS — Z85820 Personal history of malignant melanoma of skin: Secondary | ICD-10-CM

## 2016-04-06 DIAGNOSIS — K851 Biliary acute pancreatitis without necrosis or infection: Secondary | ICD-10-CM | POA: Diagnosis present

## 2016-04-06 DIAGNOSIS — Z87891 Personal history of nicotine dependence: Secondary | ICD-10-CM

## 2016-04-06 DIAGNOSIS — Z91048 Other nonmedicinal substance allergy status: Secondary | ICD-10-CM

## 2016-04-06 DIAGNOSIS — Z87410 Personal history of cervical dysplasia: Secondary | ICD-10-CM

## 2016-04-06 DIAGNOSIS — Z881 Allergy status to other antibiotic agents status: Secondary | ICD-10-CM

## 2016-04-06 DIAGNOSIS — Z91041 Radiographic dye allergy status: Secondary | ICD-10-CM

## 2016-04-06 DIAGNOSIS — R945 Abnormal results of liver function studies: Secondary | ICD-10-CM

## 2016-04-06 DIAGNOSIS — K8020 Calculus of gallbladder without cholecystitis without obstruction: Secondary | ICD-10-CM | POA: Diagnosis present

## 2016-04-06 DIAGNOSIS — I959 Hypotension, unspecified: Secondary | ICD-10-CM | POA: Diagnosis not present

## 2016-04-06 DIAGNOSIS — I471 Supraventricular tachycardia: Secondary | ICD-10-CM | POA: Diagnosis not present

## 2016-04-06 DIAGNOSIS — J309 Allergic rhinitis, unspecified: Secondary | ICD-10-CM | POA: Diagnosis present

## 2016-04-06 DIAGNOSIS — Z888 Allergy status to other drugs, medicaments and biological substances status: Secondary | ICD-10-CM

## 2016-04-06 DIAGNOSIS — K831 Obstruction of bile duct: Secondary | ICD-10-CM | POA: Diagnosis present

## 2016-04-06 DIAGNOSIS — K859 Acute pancreatitis without necrosis or infection, unspecified: Secondary | ICD-10-CM

## 2016-04-06 LAB — COMPREHENSIVE METABOLIC PANEL
ALT: 59 U/L — AB (ref 14–54)
AST: 148 U/L — AB (ref 15–41)
Albumin: 3.5 g/dL (ref 3.5–5.0)
Alkaline Phosphatase: 55 U/L (ref 38–126)
Anion gap: 8 (ref 5–15)
BUN: 12 mg/dL (ref 6–20)
CHLORIDE: 105 mmol/L (ref 101–111)
CO2: 25 mmol/L (ref 22–32)
CREATININE: 0.71 mg/dL (ref 0.44–1.00)
Calcium: 9 mg/dL (ref 8.9–10.3)
GFR calc Af Amer: >60 mL/min (ref >60)
GFR calc non Af Amer: >60 mL/min (ref >60)
GLUCOSE: 109 mg/dL — AB (ref 65–99)
Potassium: 3.8 mmol/L (ref 3.5–5.1)
SODIUM: 138 mmol/L (ref 135–145)
Total Bilirubin: 0.6 mg/dL (ref 0.3–1.2)
Total Protein: 6.2 g/dL — ABNORMAL LOW (ref 6.5–8.1)

## 2016-04-06 LAB — CBC
HCT: 38.4 % (ref 36.0–46.0)
HEMOGLOBIN: 12.7 g/dL (ref 12.0–15.0)
MCH: 29.8 pg (ref 26.0–34.0)
MCHC: 33.1 g/dL (ref 30.0–36.0)
MCV: 90.1 fL (ref 78.0–100.0)
PLATELETS: 226 10*3/uL (ref 150–400)
RBC: 4.26 MIL/uL (ref 3.87–5.11)
RDW: 13.7 % (ref 11.5–15.5)
WBC: 8.6 10*3/uL (ref 4.0–10.5)

## 2016-04-06 LAB — TROPONIN I: Troponin I: <0.03 ng/mL (ref <0.03)

## 2016-04-06 NOTE — ED Provider Notes (Addendum)
CSN: [id number]     Arrival date & time 04/06/16  2106 History   First MD Initiated Contact with Patient 04/06/16 2115     Chief Complaint  Patient presents with  . Chest Pain     (Consider location/radiation/quality/duration/timing/severity/associated sxs/prior Treatment) Patient is a 50 y.o. female presenting with chest pain. The history is provided by the patient and a relative.  Chest Pain Associated symptoms: nausea   Associated symptoms: no abdominal pain, no back pain, no cough, no fever, no headache, no shortness of breath and not vomiting   Patient c/o pain to mid back onset last pm, and again today. Occurred at rest. No relation to activity or exertion. Was like 'a pinch on the inside'.  Also felt pain in epigastric area.  Pain mod-severe. Nausea. No vomiting. No sob. Felt sweaty. No fevers. No pleuritic pain. No leg pain or swelling. No fam hx premature cad. +family hx gallstones/mother.       Past Medical History  Diagnosis Date  . Cervical dysplasia 2007    low-grade SIL, normal Pap smears since then  . IUD     inserted 03/2002.04/06/2007, 06/03/2012  . Cancer ([place])     Melanoma  . Osteomalacia   . Seafood allergy   . Intermittent palpitations   . Ocular migraine   . Allergic rhinitis    Past Surgical History  Procedure Laterality Date  . Foot surgery      toenail  . Removal of melanoma    . Intrauterine device insertion  06/03/2012    Mirena   Family History  Problem Relation Age of Onset  . Breast cancer Sister 37  . Arthritis Sister     Psoriatic  . Thyroid disease Sister   . Stroke Father   . Autoimmune disease Father   . CVA Father   . Breast cancer Maternal Grandmother     Age 51  . Tremor Mother   . Thyroid disease Daughter     Hypo   Social History  Substance Use Topics  . Smoking status: Former Research scientist (life sciences)  . Smokeless tobacco: None  . Alcohol Use: 0.0 oz/week    0 Standard drinks or equivalent per week     Comment: Rare   OB History     Gravida Para Term Preterm AB TAB SAB Ectopic Multiple Living   4 4 4       4      Review of Systems  Constitutional: Negative for fever.  HENT: Negative for sore throat.   Eyes: Negative for redness.  Respiratory: Negative for cough and shortness of breath.   Cardiovascular: Positive for chest pain.  Gastrointestinal: Positive for nausea. Negative for vomiting and abdominal pain.  Genitourinary: Negative for dysuria and flank pain.  Musculoskeletal: Negative for back pain and neck pain.  Skin: Negative for rash.  Neurological: Negative for headaches.  Hematological: Does not bruise/bleed easily.  Psychiatric/Behavioral: Negative for confusion.      Allergies  Iodine; Other; Ampicillin; Levofloxacin; and Septra  Home Medications   Prior to Admission medications   Medication Sig Start Date End Date Taking? Authorizing Provider  levonorgestrel (MIRENA) 20 MCG/24HR IUD 1 each by Intrauterine route once. Inserted 04/06/07.    Yes Historical Provider, MD  azithromycin (ZITHROMAX) 500 MG tablet Take 2 tablets (1,000 mg total) by mouth once. Take 2 tabs once for Traveler's diarrhea Patient not taking: Reported on 04/06/2016 11/24/15   Thayer Headings, MD   BP 105/51 mmHg  Pulse 73  Temp(Src)  98.5 F (36.9 C) (Oral)  Resp 16  Ht 5\' 6"  (1.676 m)  Wt 67.132 kg  BMI 23.90 kg/m2  SpO2 99% Physical Exam  Constitutional: She appears well-developed and well-nourished. No distress.  HENT:  Mouth/Throat: Oropharynx is clear and moist.  Eyes: Conjunctivae are normal. No scleral icterus.  Neck: Neck supple. No tracheal deviation present.  Cardiovascular: Normal rate, regular rhythm, normal heart sounds and intact distal pulses.  Exam reveals no gallop and no friction rub.   No murmur heard. Pulmonary/Chest: Effort normal and breath sounds normal. No respiratory distress. She exhibits no tenderness.  Abdominal: Soft. Normal appearance and bowel sounds are normal. She exhibits no distension.  There is no tenderness.  Genitourinary:  No cva tenderness  Musculoskeletal: She exhibits no edema or tenderness.  TL spine non tender, aligned. No focal sts or tenderness noted on back exam.   Neurological: She is alert.  Skin: Skin is warm and dry. No rash noted. She is not diaphoretic.  Psychiatric: She has a normal mood and affect.  Nursing note and vitals reviewed.   ED Course  Procedures (including critical care time) Labs Review   Results for orders placed or performed during the hospital encounter of 04/06/16  CBC  Result Value Ref Range   WBC 8.6 4.0 - 10.5 K/uL   RBC 4.26 3.87 - 5.11 MIL/uL   Hemoglobin 12.7 12.0 - 15.0 g/dL   HCT 38.4 36.0 - 46.0 %   MCV 90.1 78.0 - 100.0 fL   MCH 29.8 26.0 - 34.0 pg   MCHC 33.1 30.0 - 36.0 g/dL   RDW 13.7 11.5 - 15.5 %   Platelets 226 150 - 400 K/uL  Comprehensive metabolic panel  Result Value Ref Range   Sodium 138 135 - 145 mmol/L   Potassium 3.8 3.5 - 5.1 mmol/L   Chloride 105 101 - 111 mmol/L   CO2 25 22 - 32 mmol/L   Glucose, Bld 109 (H) 65 - 99 mg/dL   BUN 12 6 - 20 mg/dL   Creatinine, Ser 0.71 0.44 - 1.00 mg/dL   Calcium 9.0 8.9 - 10.3 mg/dL   Total Protein 6.2 (L) 6.5 - 8.1 g/dL   Albumin 3.5 3.5 - 5.0 g/dL   AST 148 (H) 15 - 41 U/L   ALT 59 (H) 14 - 54 U/L   Alkaline Phosphatase 55 38 - 126 U/L   Total Bilirubin 0.6 0.3 - 1.2 mg/dL   GFR calc non Af Amer >60 >60 mL/min   GFR calc Af Amer >60 >60 mL/min   Anion gap 8 5 - 15  Troponin I  Result Value Ref Range   Troponin I <0.03 <0.03 ng/mL   Dg Chest 2 View  04/06/2016  CLINICAL DATA:  50 year old female with chest pain EXAM: CHEST  2 VIEW COMPARISON:  Chest radiograph dated 03/29/2015 FINDINGS: The heart size and mediastinal contours are within normal limits. Both lungs are clear. The visualized skeletal structures are unremarkable. IMPRESSION: No active cardiopulmonary disease. Electronically Signed   By: Anner Crete M.D.   On: 04/06/2016 22:16   US  Abdomen Complete  04/06/2016  CLINICAL DATA:  Right back pain and epigastric pain with nausea for 8 months. History of melanoma. EXAM: ABDOMEN ULTRASOUND COMPLETE COMPARISON:  None. FINDINGS: Gallbladder: Several stones in the gallbladder, largest measuring 1.9 cm diameter. Small amount of sludge layering in the gallbladder. No gallbladder wall thickening. Murphy's sign is negative. Common bile duct: Diameter: 8 mm.  Upper limits  of normal range. Liver: No focal lesion identified. Within normal limits in parenchymal echogenicity. IVC: No abnormality visualized. Pancreas: Visualized portion unremarkable. Spleen: Size and appearance within normal limits. Right Kidney: Length: 10.9 cm. Echogenicity within normal limits. No mass or hydronephrosis visualized. Left Kidney: Length: 10.8 cm. Echogenicity within normal limits. No mass or hydronephrosis visualized. Abdominal aorta: No aneurysm visualized. Other findings: None. IMPRESSION: Cholelithiasis and gallbladder sludge. No additional changes to suggest cholecystitis. Extrahepatic bile duct measures 8 mm diameter, representing upper limits of normal range. Electronically Signed   By: Lucienne Capers M.D.   On: 04/06/2016 23:35       I have personally reviewed and evaluated these images and lab results as part of my medical decision-making.   EKG Interpretation   Date/Time:  Saturday April 06 2016 21:16:27 EDT Ventricular Rate:  67 PR Interval:    QRS Duration: 98 QT Interval:  387 QTC Calculation: 409 R Axis:   75 Text Interpretation:  Sinus rhythm Nonspecific T wave abnormality  Confirmed by Ashok Cordia  MD, Lennette Bihari ([id number]) on 04/06/2016 9:25:19 PM      MDM   Iv ns. Labs. Imaging studies.  Reviewed nursing notes and prior charts for additional history.   Patient declines any pain meds or other meds currently.  Called lab re delay in lipase - they indicate is high - still diluting.   Recheck pt, mild epigastric tenderness. No peritoneal signs.    Patient declines pain med.   Discussed labs, u/s w pt.  Patient indicates in retrospect now - probably 5-6 prior episodes of similar pain, but not as severe.  hospitalists paged for admission.    Given elev lfts, lipase, will also consult gi.  Patient will also need gen surgery eval/consult/referral, although feel can be done in AM.   0002 discussed w Dr Roel Cluck - will admit. Agrees w gi consult.  Gi consulted - call back pending.      Lajean Saver, MD 04/07/16 [phone]

## 2016-04-06 NOTE — ED Notes (Signed)
Pt arrives from [facility] with sudden onset mid sternal chest discomfort, diaphoresis, "woozy." Back pain onset yesterday. Took tums which was effective for pain. 324MG  ASA on board. 2/10 pain. 20G L hand

## 2016-04-07 ENCOUNTER — Encounter (HOSPITAL_COMMUNITY): Payer: Self-pay | Admitting: Internal Medicine

## 2016-04-07 DIAGNOSIS — Z91041 Radiographic dye allergy status: Secondary | ICD-10-CM | POA: Diagnosis not present

## 2016-04-07 DIAGNOSIS — I959 Hypotension, unspecified: Secondary | ICD-10-CM | POA: Diagnosis not present

## 2016-04-07 DIAGNOSIS — K8021 Calculus of gallbladder without cholecystitis with obstruction: Secondary | ICD-10-CM

## 2016-04-07 DIAGNOSIS — Z881 Allergy status to other antibiotic agents status: Secondary | ICD-10-CM | POA: Diagnosis not present

## 2016-04-07 DIAGNOSIS — K859 Acute pancreatitis without necrosis or infection, unspecified: Secondary | ICD-10-CM

## 2016-04-07 DIAGNOSIS — Z91013 Allergy to seafood: Secondary | ICD-10-CM | POA: Diagnosis not present

## 2016-04-07 DIAGNOSIS — Z91048 Other nonmedicinal substance allergy status: Secondary | ICD-10-CM | POA: Diagnosis not present

## 2016-04-07 DIAGNOSIS — R945 Abnormal results of liver function studies: Secondary | ICD-10-CM

## 2016-04-07 DIAGNOSIS — K851 Biliary acute pancreatitis without necrosis or infection: Secondary | ICD-10-CM | POA: Diagnosis present

## 2016-04-07 DIAGNOSIS — R7989 Other specified abnormal findings of blood chemistry: Secondary | ICD-10-CM

## 2016-04-07 DIAGNOSIS — Z87410 Personal history of cervical dysplasia: Secondary | ICD-10-CM | POA: Diagnosis not present

## 2016-04-07 DIAGNOSIS — J309 Allergic rhinitis, unspecified: Secondary | ICD-10-CM | POA: Diagnosis present

## 2016-04-07 DIAGNOSIS — K8020 Calculus of gallbladder without cholecystitis without obstruction: Secondary | ICD-10-CM | POA: Diagnosis present

## 2016-04-07 DIAGNOSIS — K8063 Calculus of gallbladder and bile duct with acute cholecystitis with obstruction: Secondary | ICD-10-CM | POA: Diagnosis present

## 2016-04-07 DIAGNOSIS — K831 Obstruction of bile duct: Secondary | ICD-10-CM | POA: Diagnosis present

## 2016-04-07 DIAGNOSIS — K8000 Calculus of gallbladder with acute cholecystitis without obstruction: Secondary | ICD-10-CM | POA: Diagnosis not present

## 2016-04-07 DIAGNOSIS — Z85820 Personal history of malignant melanoma of skin: Secondary | ICD-10-CM | POA: Diagnosis not present

## 2016-04-07 DIAGNOSIS — I4892 Unspecified atrial flutter: Secondary | ICD-10-CM | POA: Diagnosis not present

## 2016-04-07 DIAGNOSIS — Z888 Allergy status to other drugs, medicaments and biological substances status: Secondary | ICD-10-CM | POA: Diagnosis not present

## 2016-04-07 DIAGNOSIS — Z79899 Other long term (current) drug therapy: Secondary | ICD-10-CM | POA: Diagnosis not present

## 2016-04-07 DIAGNOSIS — I471 Supraventricular tachycardia: Secondary | ICD-10-CM | POA: Diagnosis not present

## 2016-04-07 DIAGNOSIS — Z87891 Personal history of nicotine dependence: Secondary | ICD-10-CM | POA: Diagnosis not present

## 2016-04-07 LAB — COMPREHENSIVE METABOLIC PANEL
ALT: 230 U/L — ABNORMAL HIGH (ref 14–54)
AST: 427 U/L — ABNORMAL HIGH (ref 15–41)
Albumin: 3.3 g/dL — ABNORMAL LOW (ref 3.5–5.0)
Alkaline Phosphatase: 58 U/L (ref 38–126)
Anion gap: 7 (ref 5–15)
BUN: 10 mg/dL (ref 6–20)
CO2: 24 mmol/L (ref 22–32)
Calcium: 8.1 mg/dL — ABNORMAL LOW (ref 8.9–10.3)
Chloride: 111 mmol/L (ref 101–111)
Creatinine, Ser: 0.58 mg/dL (ref 0.44–1.00)
GFR calc Af Amer: >60 mL/min (ref >60)
GFR calc non Af Amer: >60 mL/min (ref >60)
Glucose, Bld: 108 mg/dL — ABNORMAL HIGH (ref 65–99)
Potassium: 3.4 mmol/L — ABNORMAL LOW (ref 3.5–5.1)
Sodium: 142 mmol/L (ref 135–145)
Total Bilirubin: 1.4 mg/dL — ABNORMAL HIGH (ref 0.3–1.2)
Total Protein: 5.3 g/dL — ABNORMAL LOW (ref 6.5–8.1)

## 2016-04-07 LAB — CBC
HEMATOCRIT: 37.2 % (ref 36.0–46.0)
HEMOGLOBIN: 12.1 g/dL (ref 12.0–15.0)
MCH: 29.2 pg (ref 26.0–34.0)
MCHC: 32.5 g/dL (ref 30.0–36.0)
MCV: 89.9 fL (ref 78.0–100.0)
Platelets: 215 10*3/uL (ref 150–400)
RBC: 4.14 MIL/uL (ref 3.87–5.11)
RDW: 13.8 % (ref 11.5–15.5)
WBC: 9.4 10*3/uL (ref 4.0–10.5)

## 2016-04-07 LAB — LIPID PANEL
CHOLESTEROL: 147 mg/dL (ref 0–200)
HDL: 48 mg/dL (ref >40)
LDL CALC: 88 mg/dL (ref 0–99)
TRIGLYCERIDES: 57 mg/dL (ref <150)
Total CHOL/HDL Ratio: 3.1 RATIO
VLDL: 11 mg/dL (ref 0–40)

## 2016-04-07 LAB — MAGNESIUM: MAGNESIUM: 2 mg/dL (ref 1.7–2.4)

## 2016-04-07 LAB — PHOSPHORUS: Phosphorus: 3.4 mg/dL (ref 2.5–4.6)

## 2016-04-07 LAB — TSH: TSH: 0.735 u[IU]/mL (ref 0.350–4.500)

## 2016-04-07 LAB — TROPONIN I
Troponin I: <0.03 ng/mL (ref <0.03)
Troponin I: <0.03 ng/mL (ref <0.03)

## 2016-04-07 LAB — LIPASE, BLOOD
LIPASE: 5793 U/L — AB (ref 11–51)
Lipase: 1341 U/L — ABNORMAL HIGH (ref 11–51)

## 2016-04-07 MED ORDER — SODIUM CHLORIDE 0.9 % IV SOLN
INTRAVENOUS | Status: AC
  Administered 2016-04-07: 02:00:00 via INTRAVENOUS

## 2016-04-07 MED ORDER — SODIUM CHLORIDE 0.9 % IV SOLN
INTRAVENOUS | Status: AC
  Administered 2016-04-07: 18:00:00 via INTRAVENOUS

## 2016-04-07 MED ORDER — CETYLPYRIDINIUM CHLORIDE 0.05 % MT LIQD: 7.0000 mL | Freq: Two times a day (BID) | OROMUCOSAL | Status: DC

## 2016-04-07 MED ORDER — SODIUM CHLORIDE 0.9 % IV BOLUS (SEPSIS)
1000.0000 mL | Freq: Once | INTRAVENOUS | Status: AC
  Administered 2016-04-07: 1000 mL via INTRAVENOUS

## 2016-04-07 MED ORDER — HYDROCODONE-ACETAMINOPHEN 5-325 MG PO TABS: 1.0000 | ORAL_TABLET | ORAL | Status: DC | PRN

## 2016-04-07 MED ORDER — ONDANSETRON HCL 4 MG/2ML IJ SOLN: 4.0000 mg | Freq: Four times a day (QID) | INTRAMUSCULAR | Status: DC | PRN

## 2016-04-07 MED ORDER — SODIUM CHLORIDE 0.9% FLUSH
3.0000 mL | Freq: Two times a day (BID) | INTRAVENOUS | Status: DC
  Administered 2016-04-08: 3 mL via INTRAVENOUS

## 2016-04-07 MED ORDER — MORPHINE SULFATE (PF) 2 MG/ML IV SOLN: 2.0000 mg | INTRAVENOUS | Status: DC | PRN

## 2016-04-07 MED ORDER — ACETAMINOPHEN 325 MG PO TABS
650.0000 mg | ORAL_TABLET | Freq: Four times a day (QID) | ORAL | Status: DC | PRN
  Administered 2016-04-09 – 2016-04-11 (×7): 650 mg via ORAL
  Filled 2016-04-07 (×8): qty 2

## 2016-04-07 MED ORDER — ONDANSETRON HCL 4 MG PO TABS: 4.0000 mg | ORAL_TABLET | Freq: Four times a day (QID) | ORAL | Status: DC | PRN

## 2016-04-07 MED ORDER — ACETAMINOPHEN 650 MG RE SUPP: 650.0000 mg | Freq: Four times a day (QID) | RECTAL | Status: DC | PRN

## 2016-04-07 MED ORDER — CHLORHEXIDINE GLUCONATE 0.12 % MT SOLN
15.0000 mL | Freq: Two times a day (BID) | OROMUCOSAL | Status: DC
  Filled 2016-04-07: qty 15

## 2016-04-07 NOTE — H&P (Signed)
Laurie Terry [id number] DOB: 1966/03/02 DOA: 04/06/2016     PCP: Antony Blackbird, MD   Outpatient Specialists: Emmit Alexanders, cardiology [facility]  Patient coming from:  home Lives   With family    Chief Complaint: epigastric pain  HPI: Laurie Terry is a 50 y.o. female with medical history significant of history of melanoma 15 years ago in remission,    Presented with upper abdominal/chest pain said onset of radiating back associated diaphoresis and lightheaded. She reports [place] food for dinner. She have had some recurrent pain fo r the past few days. First episode woke her up at 2 in AM pain have subsided. Today pain was severe. She felt like she needed to be very still. Pain felt drilling to the back.  She took Tums patient's first helped somewhat patient called EMS was given aspirin 324 mg This is associated nausea no fever cough no shortness of breath on this is nonexertional pain was severe    IN ER: Afebrile heart rate 75 respirations 18 blood pressure 102/47 WBC 8.6 hemoglobin 12.7 sodium 138 potassium 3.8 creatinine 0.71 troponin less than 0.03 glucose 109 chest  CXR unremarkable Ultrasound showed cholelithiasis and gallbladder sludge but no cholecystitis extrahepatic bile duct measuring 8 mm which is upper limits of normal  Hospitalist was called for admission for gallbladder pancreatitis  Review of Systems:    Pertinent positives include: abdominal pain, nausea, back pain.   Constitutional:  No weight loss, night sweats, Fevers, chills, fatigue, weight loss  HEENT:  No headaches, Difficulty swallowing,Tooth/dental problems,Sore throat,  No sneezing, itching, ear ache, nasal congestion, post nasal drip,  Cardio-vascular:  No chest pain, Orthopnea, PND, anasarca, dizziness, palpitations.no Bilateral lower extremity swelling  GI:  No heartburn, indigestion, vomiting, diarrhea, change in bowel habits, loss of appetite, melena, blood in stool, hematemesis Resp:   no shortness of breath at rest. No dyspnea on exertion, No excess mucus, no productive cough, No non-productive cough, No coughing up of blood.No change in color of mucus.No wheezing. Skin:  no rash or lesions. No jaundice GU:  no dysuria, change in color of urine, no urgency or frequency. No straining to urinate.  No flank pain.  Musculoskeletal:  No joint pain or no joint swelling. No decreased range of motion. No  Psych:  No change in mood or affect. No depression or anxiety. No memory loss.  Neuro: no localizing neurological complaints, no tingling, no weakness, no double vision, no gait abnormality, no slurred speech, no confusion  As per HPI otherwise 10 point review of systems negative.   Past Medical History: Past Medical History  Diagnosis Date  . Cervical dysplasia 2007    low-grade SIL, normal Pap smears since then  . IUD     inserted 03/2002.04/06/2007, 06/03/2012  . Cancer ([place])     Melanoma  . Osteomalacia   . Seafood allergy   . Intermittent palpitations   . Ocular migraine   . Allergic rhinitis    Past Surgical History  Procedure Laterality Date  . Foot surgery      toenail  . Removal of melanoma    . Intrauterine device insertion  06/03/2012    Mirena     Social History:  Ambulatory   Independently     reports that she has quit smoking. She does not have any smokeless tobacco history on file. She reports that she drinks alcohol. She reports that she does not use illicit drugs.  Allergies:   Allergies  Allergen  Reactions  . Iodine Swelling  . Other Hives and Swelling    Seafood  . Ampicillin   . Levofloxacin   . Septra [Bactrim]       Family History:    Family History  Problem Relation Age of Onset  . Breast cancer Sister 21  . Arthritis Sister     Psoriatic  . Thyroid disease Sister   . Stroke Father   . Autoimmune disease Father   . CVA Father   . Breast cancer Maternal Grandmother     Age 63  . Tremor Mother   . Thyroid disease  Daughter     Hypo    Medications: Prior to Admission medications   Medication Sig Start Date End Date Taking? Authorizing Provider  levonorgestrel (MIRENA) 20 MCG/24HR IUD 1 each by Intrauterine route once. Inserted 04/06/07.    Yes Historical Provider, MD  azithromycin (ZITHROMAX) 500 MG tablet Take 2 tablets (1,000 mg total) by mouth once. Take 2 tabs once for Traveler's diarrhea Patient not taking: Reported on 04/06/2016 11/24/15   Thayer Headings, MD    Physical Exam: Patient Vitals for the past 24 hrs:  BP Temp Temp src Pulse Resp SpO2 Height Weight  04/06/16 2130 (!) 102/47 mmHg - - 75 18 99 % - -  04/06/16 2115 (!) 105/51 mmHg 98.5 F (36.9 C) Oral 73 16 99 % 5\' 6"  (1.676 m) 67.132 kg (148 lb)  04/06/16 2106 - - - - - 98 % - -    1. General:  in No Acute distress 2. Psychological: Alert and  Oriented 3. Head/ENT:     Dry Mucous Membranes                          Head Non traumatic, neck supple                            normal Dentition 4. SKIN:    decreased Skin turgor,  Skin clean Dry and intact no rash 5. Heart: Regular rate and rhythm no Murmur, Rub or gallop 6. Lungs: Clear to auscultation bilaterally, no wheezes or crackles   7. Abdomen: Soft, epigastric tenderness, Non distended 8. Lower extremities: no clubbing, cyanosis, or edema 9. Neurologically Grossly intact, moving all 4 extremities equally 10. MSK: Normal range of motion   body mass index is 23.9 kg/(m^2).  Labs on Admission:   Labs on Admission: I have personally reviewed following labs and imaging studies  CBC:  Recent Labs Lab 04/06/16 2148  WBC 8.6  HGB 12.7  HCT 38.4  MCV 90.1  PLT [id number]   Basic Metabolic Panel:  Recent Labs Lab 04/06/16 2148  NA 138  K 3.8  CL 105  CO2 25  GLUCOSE 109*  BUN 12  CREATININE 0.71  CALCIUM 9.0   GFR: Estimated Creatinine Clearance: 77.9 mL/min (by C-G formula based on Cr of 0.71). Liver Function Tests:  Recent Labs Lab 04/06/16 2148  AST 148*    ALT 59*  ALKPHOS 55  BILITOT 0.6  PROT 6.2*  ALBUMIN 3.5   No results for input(s): LIPASE, AMYLASE in the last 168 hours. No results for input(s): AMMONIA in the last 168 hours. Coagulation Profile: No results for input(s): INR, PROTIME in the last 168 hours. Cardiac Enzymes:  Recent Labs Lab 04/06/16 2148  TROPONINI <0.03   BNP (last 3 results) No results for input(s): PROBNP in the last  8760 hours. HbA1C: No results for input(s): HGBA1C in the last 72 hours. CBG: No results for input(s): GLUCAP in the last 168 hours. Lipid Profile: No results for input(s): CHOL, HDL, LDLCALC, TRIG, CHOLHDL, LDLDIRECT in the last 72 hours. Thyroid Function Tests: No results for input(s): TSH, T4TOTAL, FREET4, T3FREE, THYROIDAB in the last 72 hours. Anemia Panel: No results for input(s): VITAMINB12, FOLATE, FERRITIN, TIBC, IRON, RETICCTPCT in the last 72 hours. Urine analysis:    Component Value Date/Time   COLORURINE YELLOW 03/30/2015 0852   APPEARANCEUR CLOUDY* 03/30/2015 0852   LABSPEC <1.005* 03/30/2015 0852   PHURINE 5.0 03/30/2015 0852   GLUCOSEU NEG 03/30/2015 0852   HGBUR SMALL* 03/30/2015 0852   BILIRUBINUR NEG 03/30/2015 0852   KETONESUR NEG 03/30/2015 0852   PROTEINUR NEG 03/30/2015 0852   UROBILINOGEN 0.2 03/30/2015 0852   NITRITE NEG 03/30/2015 0852   LEUKOCYTESUR SMALL* 03/30/2015 0852   Sepsis Labs: @LABRCNTIP (procalcitonin:4,lacticidven:4) )No results found for this or any previous visit (from the past 240 hour(s)).     UA not obtained  No results found for: HGBA1C  Estimated Creatinine Clearance: 77.9 mL/min (by C-G formula based on Cr of 0.71).  BNP (last 3 results) No results for input(s): PROBNP in the last 8760 hours.   ECG REPORT  Independently reviewed Rate:67  Rhythm: Sinus rhythm ST&T Change: No acute ischemic changes   QTC 407  Filed Weights   04/06/16 2115  Weight: 67.132 kg (148 lb)     Cultures: No results found for: SDES,  SPECREQUEST, CULT, REPTSTATUS   Radiological Exams on Admission: Dg Chest 2 View  04/06/2016  CLINICAL DATA:  50 year old female with chest pain EXAM: CHEST  2 VIEW COMPARISON:  Chest radiograph dated 03/29/2015 FINDINGS: The heart size and mediastinal contours are within normal limits. Both lungs are clear. The visualized skeletal structures are unremarkable. IMPRESSION: No active cardiopulmonary disease. Electronically Signed   By: Anner Crete M.D.   On: 04/06/2016 22:16   US Abdomen Complete  04/06/2016  CLINICAL DATA:  Right back pain and epigastric pain with nausea for 8 months. History of melanoma. EXAM: ABDOMEN ULTRASOUND COMPLETE COMPARISON:  None. FINDINGS: Gallbladder: Several stones in the gallbladder, largest measuring 1.9 cm diameter. Small amount of sludge layering in the gallbladder. No gallbladder wall thickening. Murphy's sign is negative. Common bile duct: Diameter: 8 mm.  Upper limits of normal range. Liver: No focal lesion identified. Within normal limits in parenchymal echogenicity. IVC: No abnormality visualized. Pancreas: Visualized portion unremarkable. Spleen: Size and appearance within normal limits. Right Kidney: Length: 10.9 cm. Echogenicity within normal limits. No mass or hydronephrosis visualized. Left Kidney: Length: 10.8 cm. Echogenicity within normal limits. No mass or hydronephrosis visualized. Abdominal aorta: No aneurysm visualized. Other findings: None. IMPRESSION: Cholelithiasis and gallbladder sludge. No additional changes to suggest cholecystitis. Extrahepatic bile duct measures 8 mm diameter, representing upper limits of normal range. Electronically Signed   By: Lucienne Capers M.D.   On: 04/06/2016 23:35    Chart has been reviewed    Assessment/Plan  50 y.o. female with medical history significant of history of melanoma, here with what appears to be   gallstone pancreatitis  Present on Admission:  . Elevated LFTs - likely secondary to biliary colic  versus gall stone pancreatitis continued to trend  . Gallstone pancreatitis/ Pancreatitis due to biliary obstruction - appreciated GI consult the patient in the morning. For now we'll admitted for rehydration. Follow LFTs Given that abdominal pain had subsided to hold off on  further imaging for now. Obtain lipid panel  . Cholelithiasis patient will likely benefit from an elective cholecystectomy. Please consult surgery to schedule    Other plan as per orders.  DVT prophylaxis:  SCD      Code Status:  FULL CODE  as per patient    Family Communication:   Family not  at  Bedside    Disposition Plan:     To home once workup is complete and patient is stable   Consults called: GI  DR. Penelope Coop with [facility]  Admission status:    inpatient        Level of care     tele            I have spent a total of 57 min on this admission   Shanicqua Coldren 04/07/2016, 1:13 AM    [facility]  Pager [phone]   after 2 AM please page floor coverage PA If 7AM-7PM, please contact the day team taking care of the patient  [email or web address]  Password [email or web address]

## 2016-04-07 NOTE — Progress Notes (Signed)
Patient arrived on unit via stretcher. Patient alert and oriented x4. Patient oriented to unit,staff and room. Patient placed on telemetry, [place] notified. Patient denies pain. Skin assessment completed with Brett Fairy D., RN, no skin issue noted. Patient's IV clean, dry and infusing. Safety Fall Prevention Plan discussed with patient. Orders have been reviewed and implemented. Will continue to monitor the patient. Call light has been placed within reach.  Nena Polio BSN, RN  Phone Number: [phone]

## 2016-04-07 NOTE — Progress Notes (Addendum)
[facility] Brief Note  I have reviewed Dr. Rueben Bash note and Dr. Estell Harpin note with GI.   I have evaluated Ms. Kalmbach twice.  She is doing well.  Has limited pain in the abdomen at this time.  She is thirsty and would like a trial of clear liquids tonight.    Updated plan  Acute gallstone pancreatitis - Lipase very elevated - Renal function stable - AST/ALT 400/230 with a normal alk phos, trend with AML - Consult Gen Surgery in the morning - TG low (she is a vegetarian, so this is not surprising) - WBC normal - Continue IVF with NS at 150cc/hr overnight, reassess in the morning - TnI X 3 negative  Mild low potassium - Replete orally with KCL 87mq  Low BP - She is normal weight, likely has low baseline pressure - Recently evaluated and mentating well.  Evaluate for any change in mental status  She has limited PMH.  No further issues noted today.   MGilles Chiquito MD 5:39 PM

## 2016-04-07 NOTE — ED Notes (Signed)
Hospitalist MD at bedside. 

## 2016-04-07 NOTE — Consult Note (Signed)
Subjective:   HPI  The patient is a 50 year old female who was admitted to the hospital with abdominal pain. Her pain was located in the epigastrium with radiation to the right shoulder blade area. She states that she has been having similar attacks of this pain although not as bad for several months. In the emergency room she had an abdominal ultrasound which showed gallstones. Her CBD was 8 mm. Her lipase was 1341 yesterday but today has jumped up to 5793. Her liver enzymes are also somewhat elevated. Denies excessive alcohol. She feels much better this morning. Not really having any abdominal pain at this time.  Review of Systems No chest pain or shortness of breath  Past Medical History  Diagnosis Date  . Cervical dysplasia 2007    low-grade SIL, normal Pap smears since then  . IUD     inserted 03/2002.04/06/2007, 06/03/2012  . Cancer ([place])     Melanoma  . Osteomalacia   . Seafood allergy   . Intermittent palpitations   . Ocular migraine   . Allergic rhinitis    Past Surgical History  Procedure Laterality Date  . Foot surgery      toenail  . Removal of melanoma    . Intrauterine device insertion  06/03/2012    Mirena   Social History   Social History  . Marital Status: Married    Spouse Name: N/A  . Number of Children: N/A  . Years of Education: N/A   Occupational History  . Not on file.   Social History Main Topics  . Smoking status: Former Research scientist (life sciences)  . Smokeless tobacco: Not on file  . Alcohol Use: 0.0 oz/week    0 Standard drinks or equivalent per week     Comment: Rare  . Drug Use: No  . Sexual Activity: Yes    Birth Control/ Protection: IUD     Comment: Mirena 06/03/2012, declined insurance questions   Other Topics Concern  . Not on file   Social History Narrative   family history includes Arthritis in her sister; Autoimmune disease in her father; Breast cancer in her maternal grandmother; Breast cancer (age of onset: 81) in her sister; CVA in her father;  Stroke in her father; Thyroid disease in her daughter and sister; Tremor in her mother.  Current facility-administered medications:  .  0.9 %  sodium chloride infusion, , Intravenous, Continuous, Toy Baker, MD, Last Rate: 150 mL/hr at 04/07/16 0150 .  acetaminophen (TYLENOL) tablet 650 mg, 650 mg, Oral, Q6H PRN **OR** acetaminophen (TYLENOL) suppository 650 mg, 650 mg, Rectal, Q6H PRN, Toy Baker, MD .  antiseptic oral rinse (CPC / CETYLPYRIDINIUM CHLORIDE 0.05%) solution 7 mL, 7 mL, Mouth Rinse, q12n4p, Toy Baker, MD .  chlorhexidine (PERIDEX) 0.12 % solution 15 mL, 15 mL, Mouth Rinse, BID, Toy Baker, MD .  HYDROcodone-acetaminophen (NORCO/VICODIN) 5-325 MG per tablet 1-2 tablet, 1-2 tablet, Oral, Q4H PRN, Toy Baker, MD .  morphine 2 MG/ML injection 2 mg, 2 mg, Intravenous, Q4H PRN, Toy Baker, MD .  ondansetron (ZOFRAN) tablet 4 mg, 4 mg, Oral, Q6H PRN **OR** ondansetron (ZOFRAN) injection 4 mg, 4 mg, Intravenous, Q6H PRN, Toy Baker, MD .  sodium chloride flush (NS) 0.9 % injection 3 mL, 3 mL, Intravenous, Q12H, Toy Baker, MD, 3 mL at 04/07/16 0130 Allergies  Allergen Reactions  . Iodine Swelling  . Other Hives and Swelling    Seafood  . Ampicillin   . Levofloxacin   . Septra [Bactrim]  Objective:     BP 90/54 mmHg  Pulse 51  Temp(Src) 98.3 F (36.8 C) (Oral)  Resp 17  Ht 5\' 6"  (1.676 m)  Wt 65.772 kg (145 lb)  BMI 23.41 kg/m2  SpO2 97%  She is in no distress  Nonicteric  Heart regular rhythm no murmurs  Lungs clear  Abdomen: Bowel sounds present, soft, mild tenderness in the epigastrium but no rebound or guarding  Laboratory No components found for: D1    Assessment:     Gallstone pancreatitis        Plan:     Conservative medical management at this time for her gallstone pancreatitis. Nothing by mouth for now. IV fluids. She states that she is allergic to contrast material and  therefore CT scan has not been done. I would recheck her lipase and LFTs tomorrow. She may need an MRCP. If she has a CBD stone she will need an ERCP but will need to be premedicated for contrast allergy. She will need a surgical consult. Further decisions will be made on a daily basis.

## 2016-04-07 NOTE — ED Provider Notes (Signed)
I spoke with Dr. Penelope Coop with [name] who will put pt on the consult list for the am.  Malvin Johns, MD 04/07/16 [phone]

## 2016-04-08 ENCOUNTER — Inpatient Hospital Stay (HOSPITAL_COMMUNITY): Payer: 59

## 2016-04-08 DIAGNOSIS — Z9049 Acquired absence of other specified parts of digestive tract: Secondary | ICD-10-CM | POA: Insufficient documentation

## 2016-04-08 DIAGNOSIS — K8020 Calculus of gallbladder without cholecystitis without obstruction: Secondary | ICD-10-CM

## 2016-04-08 LAB — CBC
HCT: 36.8 % (ref 36.0–46.0)
Hemoglobin: 11.5 g/dL — ABNORMAL LOW (ref 12.0–15.0)
MCH: 28.5 pg (ref 26.0–34.0)
MCHC: 31.3 g/dL (ref 30.0–36.0)
MCV: 91.3 fL (ref 78.0–100.0)
PLATELETS: 167 10*3/uL (ref 150–400)
RBC: 4.03 MIL/uL (ref 3.87–5.11)
RDW: 14.3 % (ref 11.5–15.5)
WBC: 5.6 10*3/uL (ref 4.0–10.5)

## 2016-04-08 LAB — COMPREHENSIVE METABOLIC PANEL
ALBUMIN: 2.9 g/dL — AB (ref 3.5–5.0)
ALT: 174 U/L — AB (ref 14–54)
AST: 98 U/L — AB (ref 15–41)
Alkaline Phosphatase: 84 U/L (ref 38–126)
Anion gap: 3 — ABNORMAL LOW (ref 5–15)
BUN: 7 mg/dL (ref 6–20)
CHLORIDE: 114 mmol/L — AB (ref 101–111)
CO2: 22 mmol/L (ref 22–32)
CREATININE: 0.58 mg/dL (ref 0.44–1.00)
Calcium: 7.8 mg/dL — ABNORMAL LOW (ref 8.9–10.3)
GFR calc Af Amer: >60 mL/min (ref >60)
GFR calc non Af Amer: >60 mL/min (ref >60)
GLUCOSE: 80 mg/dL (ref 65–99)
Potassium: 3.6 mmol/L (ref 3.5–5.1)
SODIUM: 139 mmol/L (ref 135–145)
Total Bilirubin: 1.7 mg/dL — ABNORMAL HIGH (ref 0.3–1.2)
Total Protein: 4.8 g/dL — ABNORMAL LOW (ref 6.5–8.1)

## 2016-04-08 LAB — LIPASE, BLOOD: LIPASE: 363 U/L — AB (ref 11–51)

## 2016-04-08 LAB — HEMOGLOBIN A1C
Hgb A1c MFr Bld: 5 % (ref 4.8–5.6)
MEAN PLASMA GLUCOSE: 97 mg/dL

## 2016-04-08 MED ORDER — POTASSIUM CHLORIDE IN NACL 20-0.9 MEQ/L-% IV SOLN
INTRAVENOUS | Status: DC
  Administered 2016-04-08 – 2016-04-09 (×3): via INTRAVENOUS
  Filled 2016-04-08 (×7): qty 1000

## 2016-04-08 MED ORDER — ENOXAPARIN SODIUM 40 MG/0.4ML ~~LOC~~ SOLN: 40.0000 mg | SUBCUTANEOUS | Status: DC

## 2016-04-08 NOTE — Progress Notes (Signed)
Laurie Terry 12:01 PM  Subjective: Patient seen  And examined and discussed with her mother and my partner Dr. Penelope Coop as well and her hospital computer chart was reviewed and she has had back pain radiating to her midepigastric area for some time periodically but she asymptomatic and we discussed her dye allergy but due to her business phone call we could not fully discuss ERCP  Objective: vital signs stable afebrile abdomen is soft nontender good sounds white count and lipase significantly decreased transaminases decreased Assessment: gallstone pancreatitis improved  Plan: agree with MRCP and proceed with either ERCP or laparoscopic cholecystectomy with IOC and would use nonionic dye and pretreat with Benadryl and prednsone and please call us sooner if any question or problem otherwise clear liquids okay for now  [facility] E  Pager [phone] After 5PM or if no answer call [phone]

## 2016-04-08 NOTE — Progress Notes (Signed)
[facility] PROGRESS NOTE  Laurie Terry [id number] DOB: 22-Jun-1966 DOA: 04/06/2016   PCP: Antony Blackbird, MD     Assessment/Plan: Active Problems:   Elevated LFTs   Gallstone pancreatitis   Cholelithiasis   Pancreatitis due to biliary obstruction   Cholelithiasis without cholecystitis   50 year old femalemedical history significant of history of melanoma 15 years ago in remission  who was admitted to the hospital with abdominal pain. Her pain was located in the epigastrium with radiation to the right shoulder blade area. She states that she has been having similar attacks of this pain although not as bad for several months. In the emergency room she had an abdominal ultrasound which showed gallstones. Her CBD was 8 mm. Her lipase was 1341 yesterday but today has jumped up to 5793. Her liver enzymes are also somewhat elevat   Assessment and plan Acute gallstone pancreatitis-ultrasound shows cholelithiasis and gallbladder sludge, common bile duct is 8 mm Appreciate gastroenterology consultation, bilirubin and alkaline phosphatase trending up, AST/ALT improving MRCP ordered,npo We will obtain surgical consultation as well - TG low (she is a vegetarian, so this is not surprising) - WBC normal , lipase has improved from 5793> 363  - Continue IVF with NS at 150cc/hr overnight, reassess in the morning - TnI X 3 negative  Mild low potassium - Replete orally with KCL 34meq  Hypotension - She is normal weight, likely has low baseline pressure - Recently evaluated and mentating well. Evaluate for any change in mental status    DVT prophylaxsis  Lovenox  Code Status:  Full code     Family Communication: Discussed in detail with the patient/mother, all imaging results, lab results explained to the patient   Disposition Plan:  MRCP, GI to make further recommendations, surgery consult      Consultants:  GI/general  surgery    Procedures:  MRCP    Antibiotics: Anti-infectives    None         HPI/Subjective: Patient denies any significant amount of abdominal pain this morning  Objective: Filed Vitals:   04/07/16 1622 04/07/16 2010 04/08/16 0053 04/08/16 0435  BP: 99/57 94/56 94/51  92/50  Pulse: 49 52  45  Temp: 98.5 F (36.9 C)  98.3 F (36.8 C) 98.4 F (36.9 C)  TempSrc: Oral  Oral Oral  Resp: 17 16 16 16   Height:      Weight:      SpO2: 99% 100% 96% 97%    Intake/Output Summary (Last 24 hours) at 04/08/16 0830 Last data filed at 04/08/16 0600  Gross per 24 hour  Intake 1027.5 ml  Output      0 ml  Net 1027.5 ml    Exam:  Examination:  General exam: Appears calm and comfortable  Respiratory system: Clear to auscultation. Respiratory effort normal. Cardiovascular system: S1 & S2 heard, RRR. No JVD, murmurs, rubs, gallops or clicks. No pedal edema. Gastrointestinal system: Abdomen is nondistended, soft and nontender. No organomegaly or masses felt. Normal bowel sounds heard. Central nervous system: Alert and oriented. No focal neurological deficits. Extremities: Symmetric 5 x 5 power. Skin: No rashes, lesions or ulcers Psychiatry: Judgement and insight appear normal. Mood & affect appropriate.     Data Reviewed: I have personally reviewed following labs and imaging studies  Micro Results No results found for this or any previous visit (from the past 240 hour(s)).  Radiology Reports Dg Chest 2 View  04/06/2016  CLINICAL DATA:  50 year old female with  chest pain EXAM: CHEST  2 VIEW COMPARISON:  Chest radiograph dated 03/29/2015 FINDINGS: The heart size and mediastinal contours are within normal limits. Both lungs are clear. The visualized skeletal structures are unremarkable. IMPRESSION: No active cardiopulmonary disease. Electronically Signed   By: Anner Crete M.D.   On: 04/06/2016 22:16   US Abdomen Complete  04/06/2016  CLINICAL DATA:  Right back pain  and epigastric pain with nausea for 8 months. History of melanoma. EXAM: ABDOMEN ULTRASOUND COMPLETE COMPARISON:  None. FINDINGS: Gallbladder: Several stones in the gallbladder, largest measuring 1.9 cm diameter. Small amount of sludge layering in the gallbladder. No gallbladder wall thickening. Murphy's sign is negative. Common bile duct: Diameter: 8 mm.  Upper limits of normal range. Liver: No focal lesion identified. Within normal limits in parenchymal echogenicity. IVC: No abnormality visualized. Pancreas: Visualized portion unremarkable. Spleen: Size and appearance within normal limits. Right Kidney: Length: 10.9 cm. Echogenicity within normal limits. No mass or hydronephrosis visualized. Left Kidney: Length: 10.8 cm. Echogenicity within normal limits. No mass or hydronephrosis visualized. Abdominal aorta: No aneurysm visualized. Other findings: None. IMPRESSION: Cholelithiasis and gallbladder sludge. No additional changes to suggest cholecystitis. Extrahepatic bile duct measures 8 mm diameter, representing upper limits of normal range. Electronically Signed   By: Lucienne Capers M.D.   On: 04/06/2016 23:35     CBC  Recent Labs Lab 04/06/16 2148 04/07/16 0331 04/08/16 0521  WBC 8.6 9.4 5.6  HGB 12.7 12.1 11.5*  HCT 38.4 37.2 36.8  PLT 226 215 167  MCV 90.1 89.9 91.3  MCH 29.8 29.2 28.5  MCHC 33.1 32.5 31.3  RDW 13.7 13.8 14.3    Chemistries   Recent Labs Lab 04/06/16 2148 04/07/16 0331 04/08/16 0521  NA 138 142 139  K 3.8 3.4* 3.6  CL 105 111 114*  CO2 25 24 22   GLUCOSE 109* 108* 80  BUN 12 10 7   CREATININE 0.71 0.58 0.58  CALCIUM 9.0 8.1* 7.8*  MG  --  2.0  --   AST 148* 427* 98*  ALT 59* 230* 174*  ALKPHOS 55 58 84  BILITOT 0.6 1.4* 1.7*   ------------------------------------------------------------------------------------------------------------------ estimated creatinine clearance is 77.9 mL/min (by C-G formula based on Cr of  0.58). ------------------------------------------------------------------------------------------------------------------  Recent Labs  04/07/16 0331  HGBA1C 5.0   ------------------------------------------------------------------------------------------------------------------  Recent Labs  04/07/16 0331  CHOL 147  HDL 48  LDLCALC 88  TRIG 57  CHOLHDL 3.1   ------------------------------------------------------------------------------------------------------------------  Recent Labs  04/07/16 0318  TSH 0.735   ------------------------------------------------------------------------------------------------------------------ No results for input(s): VITAMINB12, FOLATE, FERRITIN, TIBC, IRON, RETICCTPCT in the last 72 hours.  Coagulation profile No results for input(s): INR, PROTIME in the last 168 hours.  No results for input(s): DDIMER in the last 72 hours.  Cardiac Enzymes  Recent Labs Lab 04/07/16 0331 04/07/16 1030 04/07/16 1511  TROPONINI <0.03 <0.03 <0.03   ------------------------------------------------------------------------------------------------------------------ Invalid input(s): POCBNP   CBG: No results for input(s): GLUCAP in the last 168 hours.     Studies: Dg Chest 2 View  04/06/2016  CLINICAL DATA:  50 year old female with chest pain EXAM: CHEST  2 VIEW COMPARISON:  Chest radiograph dated 03/29/2015 FINDINGS: The heart size and mediastinal contours are within normal limits. Both lungs are clear. The visualized skeletal structures are unremarkable. IMPRESSION: No active cardiopulmonary disease. Electronically Signed   By: Anner Crete M.D.   On: 04/06/2016 22:16   US Abdomen Complete  04/06/2016  CLINICAL DATA:  Right back pain and epigastric pain with nausea for  8 months. History of melanoma. EXAM: ABDOMEN ULTRASOUND COMPLETE COMPARISON:  None. FINDINGS: Gallbladder: Several stones in the gallbladder, largest measuring 1.9 cm diameter.  Small amount of sludge layering in the gallbladder. No gallbladder wall thickening. Murphy's sign is negative. Common bile duct: Diameter: 8 mm.  Upper limits of normal range. Liver: No focal lesion identified. Within normal limits in parenchymal echogenicity. IVC: No abnormality visualized. Pancreas: Visualized portion unremarkable. Spleen: Size and appearance within normal limits. Right Kidney: Length: 10.9 cm. Echogenicity within normal limits. No mass or hydronephrosis visualized. Left Kidney: Length: 10.8 cm. Echogenicity within normal limits. No mass or hydronephrosis visualized. Abdominal aorta: No aneurysm visualized. Other findings: None. IMPRESSION: Cholelithiasis and gallbladder sludge. No additional changes to suggest cholecystitis. Extrahepatic bile duct measures 8 mm diameter, representing upper limits of normal range. Electronically Signed   By: Lucienne Capers M.D.   On: 04/06/2016 23:35      Lab Results  Component Value Date   HGBA1C 5.0 04/07/2016   Lab Results  Component Value Date   LDLCALC 88 04/07/2016   CREATININE 0.58 04/08/2016       Scheduled Meds: . sodium chloride flush  3 mL Intravenous Q12H   Continuous Infusions:    LOS: 1 day    Time spent: >30 MINS    [facility] Pager [phone]. If 7PM-7AM, please contact night-coverage at [email or web address], password [facility] 04/08/2016, 8:30 AM  LOS: 1 day

## 2016-04-08 NOTE — Consult Note (Signed)
[facility] Surgery Consult Note  Akaila Rambo [facility] 10-28-1965  [id number].    Requesting MD: Dr. Gwenlyn Fudge Chief Complaint/Reason for Consult: gallstone pancreatitis  HPI:  50 y/o female with a PMH of melanoma 15 years ago admitted on 04/07/16 with epigastric abdominal pain that radiated to her right shoulder. She has experienced similar, but less severe episodes of pain in the past. RUQ U/S revealed gallstones and an 8 mm CBD. Labs on admission as follows: Lipase 1341, AST 148, ALT 59, T.bili 0.6.  Today she denies abdominal pain. Tolerated clear liquids today/yesterday. + flatus. Urinating without symptoms. No BM since admission. Ambulating. Denies EtOH abuse. Denies complications under general anesthesia. Takes ASA 81 mg occasionally, no other blood thinning medications.   ROS: All systems reviewed and otherwise negative except for as above  Family History  Problem Relation Age of Onset  . Breast cancer Sister 49  . Arthritis Sister     Psoriatic  . Thyroid disease Sister   . Stroke Father   . Autoimmune disease Father   . CVA Father   . Breast cancer Maternal Grandmother     Age 75  . Tremor Mother   . Thyroid disease Daughter     Hypo    Past Medical History  Diagnosis Date  . Cervical dysplasia 2007    low-grade SIL, normal Pap smears since then  . IUD     inserted 03/2002.04/06/2007, 06/03/2012  . Cancer ([place])     Melanoma  . Osteomalacia   . Seafood allergy   . Intermittent palpitations   . Ocular migraine   . Allergic rhinitis     Past Surgical History  Procedure Laterality Date  . Foot surgery      toenail  . Removal of melanoma    . Intrauterine device insertion  06/03/2012    Mirena    Social History:  reports that she has quit smoking. She does not have any smokeless tobacco history on file. She reports that she drinks alcohol. She reports that she does not use illicit drugs.  Allergies:  Allergies  Allergen Reactions  . Iodine Swelling   . Other Hives and Swelling    Seafood  . Ampicillin   . Levofloxacin   . Septra [Bactrim]     Medications Prior to Admission  Medication Sig Dispense Refill  . levonorgestrel (MIRENA) 20 MCG/24HR IUD 1 each by Intrauterine route once. Inserted 04/06/07.     Marland Kitchen azithromycin (ZITHROMAX) 500 MG tablet Take 2 tablets (1,000 mg total) by mouth once. Take 2 tabs once for Traveler's diarrhea (Patient not taking: Reported on 04/06/2016) 2 tablet 0    Blood pressure 96/57, pulse 51, temperature 98.5 F (36.9 C), temperature source Oral, resp. rate 16, height _0  (1.676 m), weight 65.772 kg (145 lb), SpO2 98 %. Physical Exam: General: pleasant, WD/WN white female who is laying in bed in NAD HEENT: head is normocephalic, atraumatic.  Sclera are noninjected.   Ears and nose without any masses or lesions.  Mouth is pink and moist Heart: regular, rate, and rhythm.  No obvious murmurs, gallops, or rubs noted.  Palpable pedal pulses bilaterally Lungs: CTAB, no wheezes, rhonchi, or rales noted.  Respiratory effort nonlabored Abd: soft, mild epigastric tenderness, ND, +BS, no masses, hernias, or organomegaly MS: all 4 extremities are symmetrical with no cyanosis, clubbing, or edema. Skin: warm and dry with no masses, lesions, or rashes Psych: A&Ox3 with an appropriate affect. Neuro: normal speech  Results for orders placed  or performed during the hospital encounter of 04/06/16 (from the past 48 hour(s))  CBC     Status: None   Collection Time: 04/06/16  9:48 PM  Result Value Ref Range   WBC 8.6 4.0 - 10.5 K/uL   RBC 4.26 3.87 - 5.11 MIL/uL   Hemoglobin 12.7 12.0 - 15.0 g/dL   HCT 38.4 36.0 - 46.0 %   MCV 90.1 78.0 - 100.0 fL   MCH 29.8 26.0 - 34.0 pg   MCHC 33.1 30.0 - 36.0 g/dL   RDW 13.7 11.5 - 15.5 %   Platelets 226 150 - 400 K/uL  Comprehensive metabolic panel     Status: Abnormal   Collection Time: 04/06/16  9:48 PM  Result Value Ref Range   Sodium 138 135 - 145 mmol/L   Potassium 3.8  3.5 - 5.1 mmol/L   Chloride 105 101 - 111 mmol/L   CO2 25 22 - 32 mmol/L   Glucose, Bld 109 (H) 65 - 99 mg/dL   BUN 12 6 - 20 mg/dL   Creatinine, Ser 0.71 0.44 - 1.00 mg/dL   Calcium 9.0 8.9 - 10.3 mg/dL   Total Protein 6.2 (L) 6.5 - 8.1 g/dL   Albumin 3.5 3.5 - 5.0 g/dL   AST 148 (H) 15 - 41 U/L   ALT 59 (H) 14 - 54 U/L   Alkaline Phosphatase 55 38 - 126 U/L   Total Bilirubin 0.6 0.3 - 1.2 mg/dL   GFR calc non Af Amer >60 >60 mL/min   GFR calc Af Amer >60 >60 mL/min    Comment: (NOTE) The eGFR has been calculated using the CKD EPI equation. This calculation has not been validated in all clinical situations. eGFR's persistently <60 mL/min signify possible Chronic Kidney Disease.    Anion gap 8 5 - 15  Lipase, blood     Status: Abnormal   Collection Time: 04/06/16  9:48 PM  Result Value Ref Range   Lipase 1341 (H) 11 - 51 U/L    Comment: RESULTS CONFIRMED BY MANUAL DILUTION  Troponin I     Status: None   Collection Time: 04/06/16  9:48 PM  Result Value Ref Range   Troponin I <0.03 <0.03 ng/mL  TSH     Status: None   Collection Time: 04/07/16  3:18 AM  Result Value Ref Range   TSH 0.735 0.350 - 4.500 uIU/mL  Lipase, blood     Status: Abnormal   Collection Time: 04/07/16  3:31 AM  Result Value Ref Range   Lipase 5793 (H) 11 - 51 U/L    Comment: RESULTS CONFIRMED BY MANUAL DILUTION  Magnesium     Status: None   Collection Time: 04/07/16  3:31 AM  Result Value Ref Range   Magnesium 2.0 1.7 - 2.4 mg/dL  Phosphorus     Status: None   Collection Time: 04/07/16  3:31 AM  Result Value Ref Range   Phosphorus 3.4 2.5 - 4.6 mg/dL  Comprehensive metabolic panel     Status: Abnormal   Collection Time: 04/07/16  3:31 AM  Result Value Ref Range   Sodium 142 135 - 145 mmol/L   Potassium 3.4 (L) 3.5 - 5.1 mmol/L   Chloride 111 101 - 111 mmol/L   CO2 24 22 - 32 mmol/L   Glucose, Bld 108 (H) 65 - 99 mg/dL   BUN 10 6 - 20 mg/dL   Creatinine, Ser 0.58 0.44 - 1.00 mg/dL   Calcium  8.1 (L) 8.9 -  10.3 mg/dL   Total Protein 5.3 (L) 6.5 - 8.1 g/dL   Albumin 3.3 (L) 3.5 - 5.0 g/dL   AST 427 (H) 15 - 41 U/L   ALT 230 (H) 14 - 54 U/L   Alkaline Phosphatase 58 38 - 126 U/L   Total Bilirubin 1.4 (H) 0.3 - 1.2 mg/dL   GFR calc non Af Amer >60 >60 mL/min   GFR calc Af Amer >60 >60 mL/min    Comment: (NOTE) The eGFR has been calculated using the CKD EPI equation. This calculation has not been validated in all clinical situations. eGFR's persistently <60 mL/min signify possible Chronic Kidney Disease.    Anion gap 7 5 - 15  CBC     Status: None   Collection Time: 04/07/16  3:31 AM  Result Value Ref Range   WBC 9.4 4.0 - 10.5 K/uL   RBC 4.14 3.87 - 5.11 MIL/uL   Hemoglobin 12.1 12.0 - 15.0 g/dL   HCT 37.2 36.0 - 46.0 %   MCV 89.9 78.0 - 100.0 fL   MCH 29.2 26.0 - 34.0 pg   MCHC 32.5 30.0 - 36.0 g/dL   RDW 13.8 11.5 - 15.5 %   Platelets 215 150 - 400 K/uL  Troponin I     Status: None   Collection Time: 04/07/16  3:31 AM  Result Value Ref Range   Troponin I <0.03 <0.03 ng/mL  Hemoglobin A1c     Status: None   Collection Time: 04/07/16  3:31 AM  Result Value Ref Range   Hgb A1c MFr Bld 5.0 4.8 - 5.6 %    Comment: (NOTE)         Pre-diabetes: 5.7 - 6.4         Diabetes: >6.4         Glycemic control for adults with diabetes: <7.0    Mean Plasma Glucose 97 mg/dL    Comment: (NOTE) Performed At: [facility] [place], [place] [id number] Lindon Romp MD [id number]   Lipid panel     Status: None   Collection Time: 04/07/16  3:31 AM  Result Value Ref Range   Cholesterol 147 0 - 200 mg/dL   Triglycerides 57 <150 mg/dL   HDL 48 >40 mg/dL   Total CHOL/HDL Ratio 3.1 RATIO   VLDL 11 0 - 40 mg/dL   LDL Cholesterol 88 0 - 99 mg/dL    Comment:        Total Cholesterol/HDL:CHD Risk Coronary Heart Disease Risk Table                     Men   Women  1/2 Average Risk   3.4   3.3  Average Risk       5.0   4.4  2 X Average Risk   9.6    7.1  3 X Average Risk  23.4   11.0        Use the calculated Patient Ratio above and the CHD Risk Table to determine the patient's CHD Risk.        ATP III CLASSIFICATION (LDL):  <100     mg/dL   Optimal  100-129  mg/dL   Near or Above                    Optimal  130-159  mg/dL   Borderline  160-189  mg/dL   High  >190     mg/dL   Very High  Troponin I     Status: None   Collection Time: 04/07/16 10:30 AM  Result Value Ref Range   Troponin I <0.03 <0.03 ng/mL  Troponin I     Status: None   Collection Time: 04/07/16  3:11 PM  Result Value Ref Range   Troponin I <0.03 <0.03 ng/mL  Comprehensive metabolic panel     Status: Abnormal   Collection Time: 04/08/16  5:21 AM  Result Value Ref Range   Sodium 139 135 - 145 mmol/L   Potassium 3.6 3.5 - 5.1 mmol/L   Chloride 114 (H) 101 - 111 mmol/L   CO2 22 22 - 32 mmol/L   Glucose, Bld 80 65 - 99 mg/dL   BUN 7 6 - 20 mg/dL   Creatinine, Ser 0.58 0.44 - 1.00 mg/dL   Calcium 7.8 (L) 8.9 - 10.3 mg/dL   Total Protein 4.8 (L) 6.5 - 8.1 g/dL   Albumin 2.9 (L) 3.5 - 5.0 g/dL   AST 98 (H) 15 - 41 U/L   ALT 174 (H) 14 - 54 U/L   Alkaline Phosphatase 84 38 - 126 U/L   Total Bilirubin 1.7 (H) 0.3 - 1.2 mg/dL   GFR calc non Af Amer >60 >60 mL/min   GFR calc Af Amer >60 >60 mL/min    Comment: (NOTE) The eGFR has been calculated using the CKD EPI equation. This calculation has not been validated in all clinical situations. eGFR's persistently <60 mL/min signify possible Chronic Kidney Disease.    Anion gap 3 (L) 5 - 15  CBC     Status: Abnormal   Collection Time: 04/08/16  5:21 AM  Result Value Ref Range   WBC 5.6 4.0 - 10.5 K/uL   RBC 4.03 3.87 - 5.11 MIL/uL   Hemoglobin 11.5 (L) 12.0 - 15.0 g/dL   HCT 36.8 36.0 - 46.0 %   MCV 91.3 78.0 - 100.0 fL   MCH 28.5 26.0 - 34.0 pg   MCHC 31.3 30.0 - 36.0 g/dL   RDW 14.3 11.5 - 15.5 %   Platelets 167 150 - 400 K/uL  Lipase, blood     Status: Abnormal   Collection Time: 04/08/16  5:21 AM   Result Value Ref Range   Lipase 363 (H) 11 - 51 U/L   Dg Chest 2 View  04/06/2016  CLINICAL DATA:  50 year old female with chest pain EXAM: CHEST  2 VIEW COMPARISON:  Chest radiograph dated 03/29/2015 FINDINGS: The heart size and mediastinal contours are within normal limits. Both lungs are clear. The visualized skeletal structures are unremarkable. IMPRESSION: No active cardiopulmonary disease. Electronically Signed   By: Anner Crete M.D.   On: 04/06/2016 22:16   US Abdomen Complete  04/06/2016  CLINICAL DATA:  Right back pain and epigastric pain with nausea for 8 months. History of melanoma. EXAM: ABDOMEN ULTRASOUND COMPLETE COMPARISON:  None. FINDINGS: Gallbladder: Several stones in the gallbladder, largest measuring 1.9 cm diameter. Small amount of sludge layering in the gallbladder. No gallbladder wall thickening. Murphy's sign is negative. Common bile duct: Diameter: 8 mm.  Upper limits of normal range. Liver: No focal lesion identified. Within normal limits in parenchymal echogenicity. IVC: No abnormality visualized. Pancreas: Visualized portion unremarkable. Spleen: Size and appearance within normal limits. Right Kidney: Length: 10.9 cm. Echogenicity within normal limits. No mass or hydronephrosis visualized. Left Kidney: Length: 10.8 cm. Echogenicity within normal limits. No mass or hydronephrosis visualized. Abdominal aorta: No aneurysm visualized. Other findings: None. IMPRESSION: Cholelithiasis and gallbladder sludge. No  additional changes to suggest cholecystitis. Extrahepatic bile duct measures 8 mm diameter, representing upper limits of normal range. Electronically Signed   By: Lucienne Capers M.D.   On: 04/06/2016 23:35   Assessment/Plan Gallstone pancreatitis Cholelithiasis  - GI ordered an MRCP today, possible ERCP for CBD stone  - ALT 174,   AST 98,   total bili 1.7,   alk phos 84,   Lipase 363  FEN: IVF @ 100 cc/hr, NPO ID: none  DVT Proph: Lovenox Plan: follow along for  laparoscopic cholecystectomy to follow MRCP/ERCP with removal of CBDstone.   Jill Alexanders, [facility] Surgery 04/08/2016, 11:41 AM Pager: [phone] Consults: [phone] Mon-Fri 7:00 am-4:30 pm Sat-Sun 7:00 am-11:30 am

## 2016-04-09 ENCOUNTER — Inpatient Hospital Stay (HOSPITAL_COMMUNITY): Payer: 59 | Admitting: Anesthesiology

## 2016-04-09 ENCOUNTER — Encounter (HOSPITAL_COMMUNITY)
Admission: EM | Disposition: A | Discharge status: Home / Self Care | Payer: Self-pay | Source: Home / Self Care | Attending: Internal Medicine

## 2016-04-09 ENCOUNTER — Encounter (HOSPITAL_COMMUNITY): Payer: Self-pay | Admitting: Certified Registered Nurse Anesthetist

## 2016-04-09 DIAGNOSIS — R7989 Other specified abnormal findings of blood chemistry: Secondary | ICD-10-CM | POA: Insufficient documentation

## 2016-04-09 DIAGNOSIS — R945 Abnormal results of liver function studies: Secondary | ICD-10-CM

## 2016-04-09 DIAGNOSIS — K8000 Calculus of gallbladder with acute cholecystitis without obstruction: Secondary | ICD-10-CM

## 2016-04-09 HISTORY — PX: CHOLECYSTECTOMY: SHX55

## 2016-04-09 LAB — CBC
HEMATOCRIT: 35.5 % — AB (ref 36.0–46.0)
HEMOGLOBIN: 11.3 g/dL — AB (ref 12.0–15.0)
MCH: 28.9 pg (ref 26.0–34.0)
MCHC: 31.8 g/dL (ref 30.0–36.0)
MCV: 90.8 fL (ref 78.0–100.0)
Platelets: 174 10*3/uL (ref 150–400)
RBC: 3.91 MIL/uL (ref 3.87–5.11)
RDW: 14 % (ref 11.5–15.5)
WBC: 6.2 10*3/uL (ref 4.0–10.5)

## 2016-04-09 LAB — COMPREHENSIVE METABOLIC PANEL
ALBUMIN: 2.9 g/dL — AB (ref 3.5–5.0)
ALK PHOS: 84 U/L (ref 38–126)
ALT: 130 U/L — ABNORMAL HIGH (ref 14–54)
ANION GAP: 6 (ref 5–15)
AST: 42 U/L — ABNORMAL HIGH (ref 15–41)
BUN: <5 mg/dL — ABNORMAL LOW (ref 6–20)
CALCIUM: 8 mg/dL — AB (ref 8.9–10.3)
CHLORIDE: 112 mmol/L — AB (ref 101–111)
CO2: 22 mmol/L (ref 22–32)
Creatinine, Ser: 0.62 mg/dL (ref 0.44–1.00)
GFR calc non Af Amer: >60 mL/min (ref >60)
GLUCOSE: 75 mg/dL (ref 65–99)
POTASSIUM: 3.9 mmol/L (ref 3.5–5.1)
SODIUM: 140 mmol/L (ref 135–145)
Total Bilirubin: 1.1 mg/dL (ref 0.3–1.2)
Total Protein: 5 g/dL — ABNORMAL LOW (ref 6.5–8.1)

## 2016-04-09 LAB — LIPASE, BLOOD: LIPASE: 65 U/L — AB (ref 11–51)

## 2016-04-09 SURGERY — LAPAROSCOPIC CHOLECYSTECTOMY
Anesthesia: General

## 2016-04-09 SURGERY — LAPAROSCOPIC CHOLECYSTECTOMY WITH INTRAOPERATIVE CHOLANGIOGRAM
Anesthesia: General | Site: Abdomen

## 2016-04-09 MED ORDER — EPHEDRINE 5 MG/ML INJ
INTRAVENOUS | Status: AC
  Filled 2016-04-09: qty 20

## 2016-04-09 MED ORDER — GLYCOPYRROLATE 0.2 MG/ML IV SOSY
PREFILLED_SYRINGE | INTRAVENOUS | Status: AC
  Filled 2016-04-09: qty 3

## 2016-04-09 MED ORDER — MIDAZOLAM HCL 2 MG/2ML IJ SOLN
INTRAMUSCULAR | Status: AC
  Filled 2016-04-09: qty 2

## 2016-04-09 MED ORDER — FENTANYL CITRATE (PF) 250 MCG/5ML IJ SOLN
INTRAMUSCULAR | Status: AC
  Filled 2016-04-09: qty 5

## 2016-04-09 MED ORDER — PHENYLEPHRINE 40 MCG/ML (10ML) SYRINGE FOR IV PUSH (FOR BLOOD PRESSURE SUPPORT)
PREFILLED_SYRINGE | INTRAVENOUS | Status: AC
  Filled 2016-04-09: qty 10

## 2016-04-09 MED ORDER — OXYCODONE HCL 5 MG/5ML PO SOLN: 5.0000 mg | Freq: Once | ORAL | Status: DC | PRN

## 2016-04-09 MED ORDER — SUGAMMADEX SODIUM 200 MG/2ML IV SOLN
INTRAVENOUS | Status: DC | PRN
  Administered 2016-04-09 (×2): 50 mg via INTRAVENOUS

## 2016-04-09 MED ORDER — DEXMEDETOMIDINE HCL IN NACL 200 MCG/50ML IV SOLN
INTRAVENOUS | Status: AC
  Filled 2016-04-09: qty 50

## 2016-04-09 MED ORDER — LIDOCAINE 2% (20 MG/ML) 5 ML SYRINGE
INTRAMUSCULAR | Status: DC | PRN
  Administered 2016-04-09: 60 mg via INTRAVENOUS

## 2016-04-09 MED ORDER — 0.9 % SODIUM CHLORIDE (POUR BTL) OPTIME
TOPICAL | Status: DC | PRN
Start: 2016-04-09 — End: 2016-04-09
  Administered 2016-04-09: 1000 mL

## 2016-04-09 MED ORDER — SODIUM CHLORIDE 0.9 % IV SOLN
INTRAVENOUS | Status: DC
  Administered 2016-04-09 – 2016-04-10 (×2): via INTRAVENOUS

## 2016-04-09 MED ORDER — DEXAMETHASONE SODIUM PHOSPHATE 10 MG/ML IJ SOLN
INTRAMUSCULAR | Status: DC | PRN
  Administered 2016-04-09: 5 mg via INTRAVENOUS

## 2016-04-09 MED ORDER — SUGAMMADEX SODIUM 200 MG/2ML IV SOLN
INTRAVENOUS | Status: AC
  Filled 2016-04-09: qty 2

## 2016-04-09 MED ORDER — ONDANSETRON HCL 4 MG/2ML IJ SOLN
INTRAMUSCULAR | Status: DC | PRN
  Administered 2016-04-09: 4 mg via INTRAVENOUS

## 2016-04-09 MED ORDER — MIDAZOLAM HCL 5 MG/5ML IJ SOLN
INTRAMUSCULAR | Status: DC | PRN
  Administered 2016-04-09 (×2): 1 mg via INTRAVENOUS

## 2016-04-09 MED ORDER — LIDOCAINE 2% (20 MG/ML) 5 ML SYRINGE
INTRAMUSCULAR | Status: AC
  Filled 2016-04-09: qty 15

## 2016-04-09 MED ORDER — CLINDAMYCIN PHOSPHATE 600 MG/50ML IV SOLN
600.0000 mg | Freq: Once | INTRAVENOUS | Status: AC
  Administered 2016-04-09: 600 mg via INTRAVENOUS
  Filled 2016-04-09: qty 50

## 2016-04-09 MED ORDER — BUPIVACAINE-EPINEPHRINE 0.25% -1:200000 IJ SOLN
INTRAMUSCULAR | Status: DC | PRN
  Administered 2016-04-09: 20 mL

## 2016-04-09 MED ORDER — OXYCODONE HCL 5 MG PO TABS: 5.0000 mg | ORAL_TABLET | Freq: Once | ORAL | Status: DC | PRN

## 2016-04-09 MED ORDER — KETOROLAC TROMETHAMINE 15 MG/ML IJ SOLN
INTRAMUSCULAR | Status: AC
  Filled 2016-04-09: qty 1

## 2016-04-09 MED ORDER — SUCCINYLCHOLINE CHLORIDE 200 MG/10ML IV SOSY
PREFILLED_SYRINGE | INTRAVENOUS | Status: AC
  Filled 2016-04-09: qty 10

## 2016-04-09 MED ORDER — BUPIVACAINE-EPINEPHRINE (PF) 0.25% -1:200000 IJ SOLN
INTRAMUSCULAR | Status: AC
  Filled 2016-04-09: qty 30

## 2016-04-09 MED ORDER — ROCURONIUM BROMIDE 10 MG/ML (PF) SYRINGE
PREFILLED_SYRINGE | INTRAVENOUS | Status: DC | PRN
  Administered 2016-04-09: 50 mg via INTRAVENOUS

## 2016-04-09 MED ORDER — KETOROLAC TROMETHAMINE 15 MG/ML IJ SOLN
15.0000 mg | Freq: Three times a day (TID) | INTRAMUSCULAR | Status: DC | PRN
  Administered 2016-04-09: 15 mg via INTRAVENOUS
  Filled 2016-04-09: qty 1

## 2016-04-09 MED ORDER — IOPAMIDOL (ISOVUE-300) INJECTION 61%
INTRAVENOUS | Status: AC
  Filled 2016-04-09: qty 50

## 2016-04-09 MED ORDER — FENTANYL CITRATE (PF) 100 MCG/2ML IJ SOLN
INTRAMUSCULAR | Status: DC | PRN
  Administered 2016-04-09 (×7): 50 ug via INTRAVENOUS
  Administered 2016-04-09: 100 ug via INTRAVENOUS
  Administered 2016-04-09: 50 ug via INTRAVENOUS

## 2016-04-09 MED ORDER — HYDROMORPHONE HCL 1 MG/ML IJ SOLN
INTRAMUSCULAR | Status: AC
  Filled 2016-04-09: qty 1

## 2016-04-09 MED ORDER — EPHEDRINE SULFATE-NACL 50-0.9 MG/10ML-% IV SOSY
PREFILLED_SYRINGE | INTRAVENOUS | Status: DC | PRN
  Administered 2016-04-09 (×2): 10 mg via INTRAVENOUS

## 2016-04-09 MED ORDER — NEOSTIGMINE METHYLSULFATE 5 MG/5ML IV SOSY
PREFILLED_SYRINGE | INTRAVENOUS | Status: AC
  Filled 2016-04-09: qty 5

## 2016-04-09 MED ORDER — PROPOFOL 10 MG/ML IV BOLUS
INTRAVENOUS | Status: DC | PRN
  Administered 2016-04-09: 150 mg via INTRAVENOUS
  Administered 2016-04-09: 20 mg via INTRAVENOUS

## 2016-04-09 MED ORDER — GLYCOPYRROLATE 0.2 MG/ML IV SOSY
PREFILLED_SYRINGE | INTRAVENOUS | Status: DC | PRN
  Administered 2016-04-09: 0.4 mg via INTRAVENOUS

## 2016-04-09 MED ORDER — NEOSTIGMINE METHYLSULFATE 5 MG/5ML IV SOSY
PREFILLED_SYRINGE | INTRAVENOUS | Status: DC | PRN
  Administered 2016-04-09: 3 mg via INTRAVENOUS

## 2016-04-09 MED ORDER — PROPOFOL 10 MG/ML IV BOLUS
INTRAVENOUS | Status: AC
  Filled 2016-04-09: qty 20

## 2016-04-09 MED ORDER — LACTATED RINGERS IV SOLN
INTRAVENOUS | Status: DC
Start: 2016-04-09 — End: 2016-04-09
  Administered 2016-04-09 (×2): via INTRAVENOUS

## 2016-04-09 MED ORDER — ENOXAPARIN SODIUM 40 MG/0.4ML ~~LOC~~ SOLN
40.0000 mg | SUBCUTANEOUS | Status: DC
  Administered 2016-04-10 – 2016-04-11 (×2): 40 mg via SUBCUTANEOUS
  Filled 2016-04-09 (×2): qty 0.4

## 2016-04-09 MED ORDER — SODIUM CHLORIDE 0.9 % IR SOLN
Status: DC | PRN
  Administered 2016-04-09: 1000 mL

## 2016-04-09 MED ORDER — ONDANSETRON HCL 4 MG/2ML IJ SOLN
INTRAMUSCULAR | Status: AC
  Filled 2016-04-09: qty 4

## 2016-04-09 MED ORDER — HYDROMORPHONE HCL 1 MG/ML IJ SOLN
0.2500 mg | INTRAMUSCULAR | Status: DC | PRN
Start: 2016-04-09 — End: 2016-04-09
  Administered 2016-04-09 (×2): 0.5 mg via INTRAVENOUS

## 2016-04-09 MED ORDER — ROCURONIUM BROMIDE 50 MG/5ML IV SOLN
INTRAVENOUS | Status: AC
  Filled 2016-04-09: qty 2

## 2016-04-09 MED ORDER — ONDANSETRON HCL 4 MG/2ML IJ SOLN: 4.0000 mg | Freq: Four times a day (QID) | INTRAMUSCULAR | Status: DC | PRN

## 2016-04-09 SURGICAL SUPPLY — 44 items
APPLIER CLIP 5 13 M/L LIGAMAX5 (MISCELLANEOUS) ×3
APR CLP MED LRG 5 ANG JAW (MISCELLANEOUS) ×1
BAG SPEC RTRVL 10 TROC 200 (ENDOMECHANICALS) ×1
BLADE SURG ROTATE 9660 (MISCELLANEOUS) IMPLANT
CANISTER SUCTION 2500CC (MISCELLANEOUS) ×3 IMPLANT
CHLORAPREP W/TINT 26ML (MISCELLANEOUS) ×3 IMPLANT
CLIP APPLIE 5 13 M/L LIGAMAX5 (MISCELLANEOUS) ×1 IMPLANT
CLOSURE WOUND 1/2 X4 (GAUZE/BANDAGES/DRESSINGS) ×1
COVER MAYO STAND STRL (DRAPES) ×3 IMPLANT
COVER SURGICAL LIGHT HANDLE (MISCELLANEOUS) ×3 IMPLANT
DEVICE TROCAR PUNCTURE CLOSURE (ENDOMECHANICALS) ×3 IMPLANT
DRAPE C-ARM 42X72 X-RAY (DRAPES) ×3 IMPLANT
ELECT REM PT RETURN 9FT ADLT (ELECTROSURGICAL) ×3
ELECTRODE REM PT RTRN 9FT ADLT (ELECTROSURGICAL) ×1 IMPLANT
GLOVE BIO SURGEON STRL SZ7 (GLOVE) ×3 IMPLANT
GLOVE BIOGEL PI IND STRL 7.5 (GLOVE) ×1 IMPLANT
GLOVE BIOGEL PI INDICATOR 7.5 (GLOVE) ×2
GOWN STRL REUS W/ TWL LRG LVL3 (GOWN DISPOSABLE) ×3 IMPLANT
GOWN STRL REUS W/TWL LRG LVL3 (GOWN DISPOSABLE) ×9
KIT BASIN OR (CUSTOM PROCEDURE TRAY) ×3 IMPLANT
KIT ROOM TURNOVER OR (KITS) ×3 IMPLANT
L-HOOK LAP DISP 36CM (ELECTROSURGICAL) ×3
LHOOK LAP DISP 36CM (ELECTROSURGICAL) IMPLANT
LIQUID BAND (GAUZE/BANDAGES/DRESSINGS) ×3 IMPLANT
NS IRRIG 1000ML POUR BTL (IV SOLUTION) ×3 IMPLANT
PAD ARMBOARD 7.5X6 YLW CONV (MISCELLANEOUS) ×3 IMPLANT
POUCH RETRIEVAL ECOSAC 10 (ENDOMECHANICALS) ×1 IMPLANT
POUCH RETRIEVAL ECOSAC 10MM (ENDOMECHANICALS) ×2
SCISSORS LAP 5X35 DISP (ENDOMECHANICALS) ×3 IMPLANT
SET CHOLANGIOGRAPH 5 50 .035 (SET/KITS/TRAYS/PACK) ×3 IMPLANT
SET IRRIG TUBING LAPAROSCOPIC (IRRIGATION / IRRIGATOR) ×3 IMPLANT
SLEEVE ENDOPATH XCEL 5M (ENDOMECHANICALS) ×8 IMPLANT
SPECIMEN JAR SMALL (MISCELLANEOUS) ×3 IMPLANT
STRIP CLOSURE SKIN 1/2X4 (GAUZE/BANDAGES/DRESSINGS) ×2 IMPLANT
SUT ETHILON 2 0 FS 18 (SUTURE) ×2 IMPLANT
SUT MNCRL AB 4-0 PS2 18 (SUTURE) ×3 IMPLANT
SUT VICRYL 0 UR6 27IN ABS (SUTURE) ×3 IMPLANT
TAPE STRIPS DRAPE STRL (GAUZE/BANDAGES/DRESSINGS) ×2 IMPLANT
TOWEL OR 17X24 6PK STRL BLUE (TOWEL DISPOSABLE) ×3 IMPLANT
TOWEL OR 17X26 10 PK STRL BLUE (TOWEL DISPOSABLE) ×3 IMPLANT
TRAY LAPAROSCOPIC MC (CUSTOM PROCEDURE TRAY) ×3 IMPLANT
TROCAR XCEL BLUNT TIP 100MML (ENDOMECHANICALS) ×3 IMPLANT
TROCAR XCEL NON-BLD 5MMX100MML (ENDOMECHANICALS) ×3 IMPLANT
TUBING INSUFFLATION (TUBING) ×3 IMPLANT

## 2016-04-09 NOTE — Anesthesia Procedure Notes (Signed)
Procedure Name: Intubation Date/Time: 04/09/2016 4:06 PM Performed by: Merdis Delay Pre-anesthesia Checklist: Patient identified, Suction available, Patient being monitored, Timeout performed and Emergency Drugs available Patient Re-evaluated:Patient Re-evaluated prior to inductionOxygen Delivery Method: Circle system utilized Preoxygenation: Pre-oxygenation with 100% oxygen Intubation Type: IV induction Ventilation: Mask ventilation without difficulty Laryngoscope Size: Mac and 3 Grade View: Grade III Tube type: Oral Tube size: 7.5 mm Number of attempts: 1 Placement Confirmation: ETT inserted through vocal cords under direct vision,  positive ETCO2,  breath sounds checked- equal and bilateral and CO2 detector Secured at: 22 cm Tube secured with: Tape Dental Injury: Teeth and Oropharynx as per pre-operative assessment  Difficulty Due To: Difficulty was unanticipated Comments: DL x1 with MAC 3 by CRNA- epiglottis view only. DL again with MAC 3 by Hoderierne- arytenoids barely visible. Would recommend glidescope next intubation.

## 2016-04-09 NOTE — Progress Notes (Signed)
[place]: Pt has JP drain to right abdomen post procedure. JP drained, 90 mls of sanguineous fluid output noted. JP drain activated again. Pt reports discomfort, Tylenol given per pt request. Right abdomen site small leakage. Stitches are intact. Gauze applied. No other complaints per pt at this time. Will continue to monitor pt. Rosana Fret RN

## 2016-04-09 NOTE — Anesthesia Preprocedure Evaluation (Signed)
Anesthesia Evaluation  Patient identified by MRN, date of birth, ID band Patient awake    Reviewed: Allergy & Precautions, H&P , NPO status , Patient's Chart, lab work & pertinent test results  Airway Mallampati: II   Neck ROM: full    Dental   Pulmonary former smoker,    breath sounds clear to auscultation       Cardiovascular negative cardio ROS   Rhythm:regular Rate:Normal     Neuro/Psych  Headaches,    GI/Hepatic   Endo/Other    Renal/GU      Musculoskeletal   Abdominal   Peds  Hematology   Anesthesia Other Findings   Reproductive/Obstetrics                             Anesthesia Physical Anesthesia Plan  ASA: II  Anesthesia Plan: General   Post-op Pain Management:    Induction: Intravenous  Airway Management Planned: Oral ETT  Additional Equipment:   Intra-op Plan:   Post-operative Plan: Extubation in OR  Informed Consent: I have reviewed the patients History and Physical, chart, labs and discussed the procedure including the risks, benefits and alternatives for the proposed anesthesia with the patient or authorized representative who has indicated his/her understanding and acceptance.     Plan Discussed with: CRNA, Anesthesiologist and Surgeon  Anesthesia Plan Comments:         Anesthesia Quick Evaluation

## 2016-04-09 NOTE — Transfer of Care (Signed)
Immediate Anesthesia Transfer of Care Note  Patient: Laurie Terry  Procedure(s) Performed: Procedure(s): LAPAROSCOPIC CHOLECYSTECTOMY  (N/A)  Patient Location: PACU  Anesthesia Type:General  Level of Consciousness: awake, alert  and oriented  Airway & Oxygen Therapy: Patient Spontanous Breathing and Patient connected to nasal cannula oxygen  Post-op Assessment: Report given to RN and Post -op Vital signs reviewed and stable  Post vital signs: Reviewed and stable  Last Vitals:  Filed Vitals:   04/09/16 0833 04/09/16 1715  BP: 96/54   Pulse: 49   Temp: 37 C 36.4 C  Resp: 16     Last Pain:  Filed Vitals:   04/09/16 1720  PainSc: Asleep         Complications: No apparent anesthesia complications

## 2016-04-09 NOTE — Progress Notes (Signed)
Laurie Terry 8:47 AM  Subjective: Patient doing well getting ready for surgery and brushing her teeth and no new complaints and we rediscussed her gallstone pancreatitis and her MR CP and her case was discussed with her surgeon as well  Objective: Vital signs stable afebrile no acute distress patient not examined today looks well labs improved  Assessment: Gallstone pancreatitis  Plan: I have recommended that she have and Intra-Op cholangiogram with premedication just to be sure no residual CBD stones present and will be on standby to help postop if needed  [facility]  Pager [phone] After 5PM or if no answer call [phone]

## 2016-04-09 NOTE — Op Note (Signed)
Preoperative diagnosis: gallstone pancreatitis, biliary colic Postoperative diagnosis: same as above Procedure: laparoscopic cholecystectomy Surgeon: Dr Serita Grammes Anesthesia: general EBL: minimal Drains none Specimen gb and contents to pathology Complications: none Sponge count correct at completion Disposition to recovery stable  Indications: This is a 82 yof who has resolved gallstone pancreatitis.  She has also had biliary colic for a number of months.  Her pancreatitis has resolved and we discussed a laparoscopic cholecystectomy.  Procedure: After informed consent was obtained the patient was taken to the operating room. She was given antibiotics. Sequential compression devices were on her legs. She was placed under general anesthesia without complication. Her abdomen was prepped and draped in the standard sterile surgical fashion. A surgical timeout was then performed.  I infiltrated marcaine below the umbilicus.  I made a vertical incision and grasped the fascia. I then incised the fascia and entered the peritoneum bluntly.  I placed a 0 vicryl pursestring suture and inserted a hasson trocar.  I then insufflated the abdomen to 15 mm Hg pressure.I then placed 3 additional 5 mm trocars in the epigastrium and the right upper quadrant.  I was able to grasp the gallbladder and retract it cephalad and lateral.she had some bile staining on the gallbladder and in right upper quadrant.  She had a lot of inflammation in the triangle and clearly had acute cholecystitis as well.   I then obtained the critical view of safety. I clipped and divided the cystic artery but did so with two sets of clips treating the anterior and posterior branches of the artery. .I treated the cystic duct in a similar fashion. The duct was diminutive and friable. I was able to visualize the cbd as well.  I did not do a cholangiogram due to CVS as well as the diminutive duct. I then removed the gallbladder from the  liver bed and placed it in a bag.  I then obtained hemostasis and irrigated. I did place a 19 Fr Blake drain in the right upper quadrant in case she needs ercp postop for retained stones.  I removed the hasson trocar and tied the pursestring down. I placed an additional 0 vicryl stitch at the umbilical incision with the endoclose device.  I then desufflated the abdomen and removed all my remaining trocars. I then closed these with 4-0 Monocryl and Dermabond. She tolerated this well was extubated and transferred to the recovery room in stable condition

## 2016-04-09 NOTE — Progress Notes (Signed)
[facility] PROGRESS NOTE  Laurie Terry [id number] DOB: 17-Sep-1966 DOA: 04/06/2016   PCP: Antony Blackbird, MD     Assessment/Plan: Active Problems:   Elevated LFTs   Gallstone pancreatitis   Cholelithiasis   Pancreatitis due to biliary obstruction   Cholelithiasis without cholecystitis   Elevated liver function tests   50 year old femalemedical history significant of history of melanoma 15 years ago in remission  who was admitted to the hospital with abdominal pain. Her pain was located in the epigastrium with radiation to the right shoulder blade area. She states that she has been having similar attacks of this pain although not as bad for several months. In the emergency room she had an abdominal ultrasound which showed gallstones. Her CBD was 8 mm. Her lipase was 1341 on admission, yesterday but today has jumped up to 5793. Now   64   Assessment and plan Acute gallstone pancreatitis-ultrasound shows cholelithiasis and gallbladder sludge, common bile duct is 8 mm Appreciate gastroenterology consultation,  AST/ALT improving MRCP  distal arch show any evidence of biliary obstruction or choledocholithiasis.3. Cholelithiasis with gallbladder wall thickening. Surgery consulted and they recommend laparoscopic cholecystectomy today, GI recommends Intra-Op cholangiogram with premedication  - TG low (she is a vegetarian, so this is not surprising) - WBC normal , lipase has improved from 5793> 363 >65 - Continue IVF with NS at 150cc/hr overnight, reassess in the morning - TnI X 3 negative  Mild low potassium - Replete orally with KCL 107meq  Hypotension - She is normal weight, likely has low baseline pressure - Recently evaluated and mentating well. Evaluate for any change in mental status    DVT prophylaxsis  Lovenox  Code Status:  Full code     Family Communication: Discussed in detail with the patient/mother, all imaging results, lab results explained to the  patient   Disposition Plan:  Laparoscopic cholecystectomy today      Consultants:  GI/general surgery    Procedures:  MRCP    Antibiotics: Anti-infectives    Start     Dose/Rate Route Frequency Ordered Stop   04/09/16 0800  clindamycin (CLEOCIN) IVPB 600 mg     600 mg 100 mL/hr over 30 Minutes Intravenous  Once 04/09/16 0755           HPI/Subjective: Patient denies any significant Abdominal pain, no nausea no vomiting,NPO  Objective: Filed Vitals:   04/08/16 1735 04/08/16 2128 04/08/16 2136 04/09/16 0611  BP: 103/50 86/37 87/47  91/46  Pulse: 45 54  56  Temp: 98.4 F (36.9 C) 98.1 F (36.7 C)  97.6 F (36.4 C)  TempSrc: Oral Oral  Oral  Resp: 16 18  16   Height:      Weight:    67.2 kg (148 lb 2.4 oz)  SpO2: 100% 100%  97%    Intake/Output Summary (Last 24 hours) at 04/09/16 0807 Last data filed at 04/09/16 0615  Gross per 24 hour  Intake 2807.92 ml  Output      0 ml  Net 2807.92 ml    Exam:  Examination:  General exam: Appears calm and comfortable  Respiratory system: Clear to auscultation. Respiratory effort normal. Cardiovascular system: S1 & S2 heard, RRR. No JVD, murmurs, rubs, gallops or clicks. No pedal edema. Gastrointestinal system: Abdomen is nondistended, soft and nontender. No organomegaly or masses felt. Normal bowel sounds heard. Central nervous system: Alert and oriented. No focal neurological deficits. Extremities: Symmetric 5 x 5 power. Skin: No rashes, lesions  or ulcers Psychiatry: Judgement and insight appear normal. Mood & affect appropriate.     Data Reviewed: I have personally reviewed following labs and imaging studies  Micro Results No results found for this or any previous visit (from the past 240 hour(s)).  Radiology Reports Dg Chest 2 View  04/06/2016  CLINICAL DATA:  50 year old female with chest pain EXAM: CHEST  2 VIEW COMPARISON:  Chest radiograph dated 03/29/2015 FINDINGS: The heart size and mediastinal  contours are within normal limits. Both lungs are clear. The visualized skeletal structures are unremarkable. IMPRESSION: No active cardiopulmonary disease. Electronically Signed   By: Anner Crete M.D.   On: 04/06/2016 22:16   US Abdomen Complete  04/06/2016  CLINICAL DATA:  Right back pain and epigastric pain with nausea for 8 months. History of melanoma. EXAM: ABDOMEN ULTRASOUND COMPLETE COMPARISON:  None. FINDINGS: Gallbladder: Several stones in the gallbladder, largest measuring 1.9 cm diameter. Small amount of sludge layering in the gallbladder. No gallbladder wall thickening. Murphy's sign is negative. Common bile duct: Diameter: 8 mm.  Upper limits of normal range. Liver: No focal lesion identified. Within normal limits in parenchymal echogenicity. IVC: No abnormality visualized. Pancreas: Visualized portion unremarkable. Spleen: Size and appearance within normal limits. Right Kidney: Length: 10.9 cm. Echogenicity within normal limits. No mass or hydronephrosis visualized. Left Kidney: Length: 10.8 cm. Echogenicity within normal limits. No mass or hydronephrosis visualized. Abdominal aorta: No aneurysm visualized. Other findings: None. IMPRESSION: Cholelithiasis and gallbladder sludge. No additional changes to suggest cholecystitis. Extrahepatic bile duct measures 8 mm diameter, representing upper limits of normal range. Electronically Signed   By: Lucienne Capers M.D.   On: 04/06/2016 23:35   Mr Abdomen Mrcp Wo Cm  04/08/2016  CLINICAL DATA:  Gallstone pancreatitis. Past medical history of melanoma 15 years ago. Epigastric abdominal pain. Elevated lipase and liver function tests. EXAM: MRI ABDOMEN WITHOUT CONTRAST  (INCLUDING MRCP) TECHNIQUE: Multiplanar multisequence MR imaging of the abdomen was performed. Heavily T2-weighted images of the biliary and pancreatic ducts were obtained, and three-dimensional MRCP images were rendered by post processing. COMPARISON:  04/06/2016 abdominal ultrasound.  FINDINGS: Mild degradation secondary to motion and less so lack of IV contrast. Lower chest: Trace right larger than left pleural effusions. Normal heart size. Hepatobiliary: No focal liver lesion. No intrahepatic ductal dilatation. Gallstones including at 16 mm. Moderate-to-marked gallbladder wall thickening, including at up to 12 mm on image 22/series 4. No common duct dilatation.  No choledocholithiasis. Pancreas: No pancreatic duct dilatation or dominant mass. There is nonspecific edema within the anterior pararenal space, including adjacent the tail on image 19/series 8. No peripancreatic fluid collection. Spleen: Normal in size, without focal abnormality. Adrenals/Urinary Tract: Normal adrenal glands. Normal kidneys, without hydronephrosis. Stomach/Bowel: Normal stomach and abdominal bowel loops. Vascular/Lymphatic: Normal caliber of the aorta and branch vessels. No retroperitoneal or retrocrural adenopathy. Other: Small volume perihepatic and perisplenic ascites. Musculoskeletal: No acute osseous abnormality. IMPRESSION: 1. Mild degradation secondary to motion and lack of IV contrast. 2. No evidence of biliary duct dilatation or choledocholithiasis. 3. Cholelithiasis with gallbladder wall thickening. This wall thickening is new since 04/06/2016 ultrasound. If acute cholecystitis is a concern, consider repeat ultrasound or nuclear medicine hepatobiliary study. 4. No specific findings of pancreatitis or its complications. There is minimal ascites with nonspecific edema throughout the anterior pararenal space. 5. Small bilateral pleural effusions. Electronically Signed   By: Abigail Miyamoto M.D.   On: 04/08/2016 15:46     CBC  Recent Labs Lab 04/06/16 2148  04/07/16 0331 04/08/16 0521 04/09/16 0505  WBC 8.6 9.4 5.6 6.2  HGB 12.7 12.1 11.5* 11.3*  HCT 38.4 37.2 36.8 35.5*  PLT 226 215 167 174  MCV 90.1 89.9 91.3 90.8  MCH 29.8 29.2 28.5 28.9  MCHC 33.1 32.5 31.3 31.8  RDW 13.7 13.8 14.3 14.0     Chemistries   Recent Labs Lab 04/06/16 2148 04/07/16 0331 04/08/16 0521 04/09/16 0505  NA 138 142 139 140  K 3.8 3.4* 3.6 3.9  CL 105 111 114* 112*  CO2 25 24 22 22   GLUCOSE 109* 108* 80 75  BUN 12 10 7  <5*  CREATININE 0.71 0.58 0.58 0.62  CALCIUM 9.0 8.1* 7.8* 8.0*  MG  --  2.0  --   --   AST 148* 427* 98* 42*  ALT 59* 230* 174* 130*  ALKPHOS 55 58 84 84  BILITOT 0.6 1.4* 1.7* 1.1   ------------------------------------------------------------------------------------------------------------------ estimated creatinine clearance is 77.9 mL/min (by C-G formula based on Cr of 0.62). ------------------------------------------------------------------------------------------------------------------  Recent Labs  04/07/16 0331  HGBA1C 5.0   ------------------------------------------------------------------------------------------------------------------  Recent Labs  04/07/16 0331  CHOL 147  HDL 48  LDLCALC 88  TRIG 57  CHOLHDL 3.1   ------------------------------------------------------------------------------------------------------------------  Recent Labs  04/07/16 0318  TSH 0.735   ------------------------------------------------------------------------------------------------------------------ No results for input(s): VITAMINB12, FOLATE, FERRITIN, TIBC, IRON, RETICCTPCT in the last 72 hours.  Coagulation profile No results for input(s): INR, PROTIME in the last 168 hours.  No results for input(s): DDIMER in the last 72 hours.  Cardiac Enzymes  Recent Labs Lab 04/07/16 0331 04/07/16 1030 04/07/16 1511  TROPONINI <0.03 <0.03 <0.03   ------------------------------------------------------------------------------------------------------------------ Invalid input(s): POCBNP   CBG: No results for input(s): GLUCAP in the last 168 hours.     Studies: Mr Abdomen Mrcp Wo Cm  04/08/2016  CLINICAL DATA:  Gallstone pancreatitis. Past medical history  of melanoma 15 years ago. Epigastric abdominal pain. Elevated lipase and liver function tests. EXAM: MRI ABDOMEN WITHOUT CONTRAST  (INCLUDING MRCP) TECHNIQUE: Multiplanar multisequence MR imaging of the abdomen was performed. Heavily T2-weighted images of the biliary and pancreatic ducts were obtained, and three-dimensional MRCP images were rendered by post processing. COMPARISON:  04/06/2016 abdominal ultrasound. FINDINGS: Mild degradation secondary to motion and less so lack of IV contrast. Lower chest: Trace right larger than left pleural effusions. Normal heart size. Hepatobiliary: No focal liver lesion. No intrahepatic ductal dilatation. Gallstones including at 16 mm. Moderate-to-marked gallbladder wall thickening, including at up to 12 mm on image 22/series 4. No common duct dilatation.  No choledocholithiasis. Pancreas: No pancreatic duct dilatation or dominant mass. There is nonspecific edema within the anterior pararenal space, including adjacent the tail on image 19/series 8. No peripancreatic fluid collection. Spleen: Normal in size, without focal abnormality. Adrenals/Urinary Tract: Normal adrenal glands. Normal kidneys, without hydronephrosis. Stomach/Bowel: Normal stomach and abdominal bowel loops. Vascular/Lymphatic: Normal caliber of the aorta and branch vessels. No retroperitoneal or retrocrural adenopathy. Other: Small volume perihepatic and perisplenic ascites. Musculoskeletal: No acute osseous abnormality. IMPRESSION: 1. Mild degradation secondary to motion and lack of IV contrast. 2. No evidence of biliary duct dilatation or choledocholithiasis. 3. Cholelithiasis with gallbladder wall thickening. This wall thickening is new since 04/06/2016 ultrasound. If acute cholecystitis is a concern, consider repeat ultrasound or nuclear medicine hepatobiliary study. 4. No specific findings of pancreatitis or its complications. There is minimal ascites with nonspecific edema throughout the anterior  pararenal space. 5. Small bilateral pleural effusions. Electronically Signed   By: Abigail Miyamoto  M.D.   On: 04/08/2016 15:46      Lab Results  Component Value Date   HGBA1C 5.0 04/07/2016   Lab Results  Component Value Date   LDLCALC 88 04/07/2016   CREATININE 0.62 04/09/2016       Scheduled Meds: . clindamycin (CLEOCIN) IV  600 mg Intravenous Once  . enoxaparin (LOVENOX) injection  40 mg Subcutaneous Q24H  . sodium chloride flush  3 mL Intravenous Q12H   Continuous Infusions: . 0.9 % NaCl with KCl 20 mEq / L 125 mL/hr at 04/08/16 2340     LOS: 2 days    Time spent: >30 MINS    [facility] Pager [phone]. If 7PM-7AM, please contact night-coverage at [email or web address], password [facility] 04/09/2016, 8:07 AM  LOS: 2 days

## 2016-04-09 NOTE — Progress Notes (Signed)
Subjective: No abd pain, no complaints  Objective: Vital signs in last 24 hours: Temp:  [97.6 F (36.4 C)-98.5 F (36.9 C)] 97.6 F (36.4 C) (07/18 0611) Pulse Rate:  [45-56] 56 (07/18 0611) Resp:  [16-18] 16 (07/18 0611) BP: (86-103)/(37-57) 91/46 mmHg (07/18 0611) SpO2:  [97 %-100 %] 97 % (07/18 0611) Weight:  [67.2 kg (148 lb 2.4 oz)] 67.2 kg (148 lb 2.4 oz) (07/18 0611) Last BM Date: 04/08/16  Intake/Output from previous day: 07/17 0701 - 07/18 0700 In: 2807.9 [P.O.:360; I.V.:2447.9] Out: 0  Intake/Output this shift:    GI: soft nt/nd  Lab Results:   Recent Labs  04/08/16 0521 04/09/16 0505  WBC 5.6 6.2  HGB 11.5* 11.3*  HCT 36.8 35.5*  PLT 167 174   BMET  Recent Labs  04/08/16 0521 04/09/16 0505  NA 139 140  K 3.6 3.9  CL 114* 112*  CO2 22 22  GLUCOSE 80 75  BUN 7 <5*  CREATININE 0.58 0.62  CALCIUM 7.8* 8.0*   PT/INR No results for input(s): LABPROT, INR in the last 72 hours. ABG No results for input(s): PHART, HCO3 in the last 72 hours.  Invalid input(s): PCO2, PO2  Studies/Results: Mr Abdomen Mrcp Wo Cm  04/08/2016  CLINICAL DATA:  Gallstone pancreatitis. Past medical history of melanoma 15 years ago. Epigastric abdominal pain. Elevated lipase and liver function tests. EXAM: MRI ABDOMEN WITHOUT CONTRAST  (INCLUDING MRCP) TECHNIQUE: Multiplanar multisequence MR imaging of the abdomen was performed. Heavily T2-weighted images of the biliary and pancreatic ducts were obtained, and three-dimensional MRCP images were rendered by post processing. COMPARISON:  04/06/2016 abdominal ultrasound. FINDINGS: Mild degradation secondary to motion and less so lack of IV contrast. Lower chest: Trace right larger than left pleural effusions. Normal heart size. Hepatobiliary: No focal liver lesion. No intrahepatic ductal dilatation. Gallstones including at 16 mm. Moderate-to-marked gallbladder wall thickening, including at up to 12 mm on image 22/series 4. No  common duct dilatation.  No choledocholithiasis. Pancreas: No pancreatic duct dilatation or dominant mass. There is nonspecific edema within the anterior pararenal space, including adjacent the tail on image 19/series 8. No peripancreatic fluid collection. Spleen: Normal in size, without focal abnormality. Adrenals/Urinary Tract: Normal adrenal glands. Normal kidneys, without hydronephrosis. Stomach/Bowel: Normal stomach and abdominal bowel loops. Vascular/Lymphatic: Normal caliber of the aorta and branch vessels. No retroperitoneal or retrocrural adenopathy. Other: Small volume perihepatic and perisplenic ascites. Musculoskeletal: No acute osseous abnormality. IMPRESSION: 1. Mild degradation secondary to motion and lack of IV contrast. 2. No evidence of biliary duct dilatation or choledocholithiasis. 3. Cholelithiasis with gallbladder wall thickening. This wall thickening is new since 04/06/2016 ultrasound. If acute cholecystitis is a concern, consider repeat ultrasound or nuclear medicine hepatobiliary study. 4. No specific findings of pancreatitis or its complications. There is minimal ascites with nonspecific edema throughout the anterior pararenal space. 5. Small bilateral pleural effusions. Electronically Signed   By: Abigail Miyamoto M.D.   On: 04/08/2016 15:46    Anti-infectives: Anti-infectives    Start     Dose/Rate Route Frequency Ordered Stop   04/09/16 0800  clindamycin (CLEOCIN) IVPB 600 mg     600 mg 100 mL/hr over 30 Minutes Intravenous  Once 04/09/16 [id number]        Assessment/Plan: Resolved pancreatitis Biliary colic  Plan for lap chole today (without cholangiogram). I discussed the procedure in detail.    We discussed the risks and benefits of a laparoscopic cholecystectomy and possible cholangiogram including, but not limited to bleeding,  infection, injury to surrounding structures such as the intestine or liver, bile leak, retained gallstones, need to convert to an open procedure,  prolonged diarrhea, blood clots such as  DVT, common bile duct injury, anesthesia risks, and possible need for additional procedures.  The likelihood of improvement in symptoms and return to the patient's normal status is good. We discussed the typical post-operative recovery course.   [facility] 04/09/2016

## 2016-04-10 ENCOUNTER — Encounter (HOSPITAL_COMMUNITY): Payer: Self-pay | Admitting: General Surgery

## 2016-04-10 LAB — COMPREHENSIVE METABOLIC PANEL
ALT: 101 U/L — AB (ref 14–54)
ANION GAP: 8 (ref 5–15)
AST: 27 U/L (ref 15–41)
Albumin: 3.1 g/dL — ABNORMAL LOW (ref 3.5–5.0)
Alkaline Phosphatase: 84 U/L (ref 38–126)
BUN: <5 mg/dL — ABNORMAL LOW (ref 6–20)
CHLORIDE: 106 mmol/L (ref 101–111)
CO2: 23 mmol/L (ref 22–32)
Calcium: 8.3 mg/dL — ABNORMAL LOW (ref 8.9–10.3)
Creatinine, Ser: 0.54 mg/dL (ref 0.44–1.00)
GFR calc non Af Amer: >60 mL/min (ref >60)
Glucose, Bld: 104 mg/dL — ABNORMAL HIGH (ref 65–99)
POTASSIUM: 4 mmol/L (ref 3.5–5.1)
SODIUM: 137 mmol/L (ref 135–145)
Total Bilirubin: 1.2 mg/dL (ref 0.3–1.2)
Total Protein: 5.2 g/dL — ABNORMAL LOW (ref 6.5–8.1)

## 2016-04-10 LAB — CBC
HEMATOCRIT: 35.3 % — AB (ref 36.0–46.0)
HEMOGLOBIN: 11.6 g/dL — AB (ref 12.0–15.0)
MCH: 29.4 pg (ref 26.0–34.0)
MCHC: 32.9 g/dL (ref 30.0–36.0)
MCV: 89.4 fL (ref 78.0–100.0)
Platelets: 206 10*3/uL (ref 150–400)
RBC: 3.95 MIL/uL (ref 3.87–5.11)
RDW: 13.8 % (ref 11.5–15.5)
WBC: 9 10*3/uL (ref 4.0–10.5)

## 2016-04-10 LAB — TSH: TSH: 0.457 u[IU]/mL (ref 0.350–4.500)

## 2016-04-10 LAB — T4, FREE: Free T4: 0.97 ng/dL (ref 0.61–1.12)

## 2016-04-10 MED ORDER — PHENOL 1.4 % MT LIQD: 1.0000 | OROMUCOSAL | Status: DC | PRN

## 2016-04-10 NOTE — Progress Notes (Addendum)
Laurie Terry 11:22 AM  Subjective: Patient walking in the hall looks well surgical findings noted no new complaints  Objective: Vital signs stable afebrile patient looks well drain with moderate fluid labs improved not examined today in the hallway  Assessment: Status post laparoscopic cholecystectomy for gallstone pancreatitis  Plan: Please let me know if I could be of any further assistance with this hospital stay and happy to see back when necessary otherwise we'll arrange recheck outpatient labs just to make sure all levels back to normal and we discussed the rare risk of residual CBD stones in the face of negative MRCP  [facility]  Pager [phone] After 5PM or if no answer call [phone]

## 2016-04-10 NOTE — Progress Notes (Signed)
[facility] Progress Note  1 Day Post-Op  Subjective: Reports abdominal soreness, only taking PO tylenol for pain. Reports shaking chills overnight but denies fevers or night sweats. Reports a lot of output from her JP drain which is concerning to her. Denies green drainage from site. Tolerating PO. Urinating. No flatus or BM since surgery. Also reports palpitations overnight.  Objective: Vital signs in last 24 hours: Temp:  [97.3 F (36.3 C)-98.5 F (36.9 C)] 98.3 F (36.8 C) (07/19 0435) Pulse Rate:  [51-65] 51 (07/19 0435) Resp:  [8-15] 15 (07/19 0435) BP: (90-113)/(40-80) 90/40 mmHg (07/19 0435) SpO2:  [93 %-99 %] 96 % (07/19 0435) Last BM Date: 04/08/16  Intake/Output from previous day: 07/18 0701 - 07/19 0700 In: 2052.1 [I.V.:2002.1; IV Piggyback:50] Out: 235 [Drains:220; Blood:15] Intake/Output this shift:    PE: Gen:  Alert, NAD, pleasant Card:  RRR, no M/G/R heard Pulm:  CTA, no W/R/R Abd: Soft, appropriately tender, mildly distended, +BS, steri strips over laparoscopic port sites without drainage. JP drain in RUQ with serosanguinous output, no bilious drainage appreciated.  Lab Results:   Recent Labs  04/09/16 0505 04/10/16 0555  WBC 6.2 9.0  HGB 11.3* 11.6*  HCT 35.5* 35.3*  PLT 174 206   BMET  Recent Labs  04/09/16 0505 04/10/16 0555  NA 140 137  K 3.9 4.0  CL 112* 106  CO2 22 23  GLUCOSE 75 104*  BUN <5* <5*  CREATININE 0.62 0.54  CALCIUM 8.0* 8.3*   PT/INR No results for input(s): LABPROT, INR in the last 72 hours. CMP     Component Value Date/Time   NA 137 04/10/2016 0555   K 4.0 04/10/2016 0555   CL 106 04/10/2016 0555   CO2 23 04/10/2016 0555   GLUCOSE 104* 04/10/2016 0555   BUN <5* 04/10/2016 0555   CREATININE 0.54 04/10/2016 0555   CREATININE 0.64 04/28/2014 0900   CALCIUM 8.3* 04/10/2016 0555   PROT 5.2* 04/10/2016 0555   ALBUMIN 3.1* 04/10/2016 0555   AST 27 04/10/2016 0555   ALT 101* 04/10/2016 0555    ALKPHOS 84 04/10/2016 0555   BILITOT 1.2 04/10/2016 0555   GFRNONAA >60 04/10/2016 0555   GFRAA >60 04/10/2016 0555   Lipase     Component Value Date/Time   LIPASE 65* 04/09/2016 0505       Studies/Results: Mr Abdomen Mrcp Wo Cm  04/08/2016  CLINICAL DATA:  Gallstone pancreatitis. Past medical history of melanoma 15 years ago. Epigastric abdominal pain. Elevated lipase and liver function tests. EXAM: MRI ABDOMEN WITHOUT CONTRAST  (INCLUDING MRCP) TECHNIQUE: Multiplanar multisequence MR imaging of the abdomen was performed. Heavily T2-weighted images of the biliary and pancreatic ducts were obtained, and three-dimensional MRCP images were rendered by post processing. COMPARISON:  04/06/2016 abdominal ultrasound. FINDINGS: Mild degradation secondary to motion and less so lack of IV contrast. Lower chest: Trace right larger than left pleural effusions. Normal heart size. Hepatobiliary: No focal liver lesion. No intrahepatic ductal dilatation. Gallstones including at 16 mm. Moderate-to-marked gallbladder wall thickening, including at up to 12 mm on image 22/series 4. No common duct dilatation.  No choledocholithiasis. Pancreas: No pancreatic duct dilatation or dominant mass. There is nonspecific edema within the anterior pararenal space, including adjacent the tail on image 19/series 8. No peripancreatic fluid collection. Spleen: Normal in size, without focal abnormality. Adrenals/Urinary Tract: Normal adrenal glands. Normal kidneys, without hydronephrosis. Stomach/Bowel: Normal stomach and abdominal bowel loops. Vascular/Lymphatic: Normal caliber of the aorta and branch vessels. No retroperitoneal  or retrocrural adenopathy. Other: Small volume perihepatic and perisplenic ascites. Musculoskeletal: No acute osseous abnormality. IMPRESSION: 1. Mild degradation secondary to motion and lack of IV contrast. 2. No evidence of biliary duct dilatation or choledocholithiasis. 3. Cholelithiasis with gallbladder  wall thickening. This wall thickening is new since 04/06/2016 ultrasound. If acute cholecystitis is a concern, consider repeat ultrasound or nuclear medicine hepatobiliary study. 4. No specific findings of pancreatitis or its complications. There is minimal ascites with nonspecific edema throughout the anterior pararenal space. 5. Small bilateral pleural effusions. Electronically Signed   By: Abigail Miyamoto M.D.   On: 04/08/2016 15:46    Anti-infectives: Anti-infectives    Start     Dose/Rate Route Frequency Ordered Stop   04/09/16 0800  clindamycin (CLEOCIN) IVPB 600 mg     600 mg 100 mL/hr over 30 Minutes Intravenous  Once 04/09/16 0755 04/09/16 1140     Assessment/Plan Gallstone pancreatitis Cholelithiasis  POD#1 laparoscopic cholecystectomy - 04/09/16 Dr. Serita Grammes - LFTs trending down: AST 27      ALT 101  Plan: from a surgical standpoint, the patient is ready for discharge. Her JP drain will remain in place until she sees Dr. Donne Hazel on Friday 7/21. She can continue tylenol for pain and will follow up at our office in 2 weeks. I will place her follow-up instructions in her discharge paperwork.     LOS: 3 days    [facility] 04/10/2016, 9:37 AM Pager: [phone] Consults: [phone] Mon-Fri 7:00 am-4:30 pm Sat-Sun 7:00 am-11:30 am

## 2016-04-10 NOTE — Progress Notes (Signed)
[facility] PROGRESS NOTE  Laurie Terry [id number] DOB: 1966-03-08 DOA: 04/06/2016   PCP: Antony Blackbird, MD     Assessment/Plan: Active Problems:   Elevated LFTs   Gallstone pancreatitis   Cholelithiasis   Pancreatitis due to biliary obstruction   Cholelithiasis without cholecystitis   Elevated liver function tests   50 year old female medical history significant of history of melanoma 15 years ago in remission  who was admitted to the hospital with abdominal pain. Her pain was located in the epigastrium with radiation to the right shoulder blade area. She states that she has been having similar attacks of this pain although not as bad for several months. In the emergency room she had an abdominal ultrasound which showed gallstones. Her CBD was 8 mm. Her lipase was 1341 on admission, yesterday but today has jumped up to 5793. Now   41. Patient status post cholecystectomy on 7/18   Assessment and plan Acute gallstone pancreatitis-ultrasound shows cholelithiasis and gallbladder sludge, common bile duct is 8 mm MRCP  distal arch show any evidence of biliary obstruction or choledocholithiasis.3. Cholelithiasis with gallbladder wall thickening. Surgery consulted POD#1 laparoscopic cholecystectomy - 04/09/16 Dr. Serita Grammes, JP drain in place. Patient was told this will be removed on Friday. Patient not comfortable going home today because she still has some pain, nausea, has not been advanced to a regular diet - TG low (she is a vegetarian, so this is not surprising) - WBC normal , lipase has improved from 5793> 363 >65 - Continue IVF with NS at 150cc/hr overnight, reassess in the morning - TnI X 3 negative Advance patient to a soft diet   SVT Will check TSH and free T4 and check 2-D echo to rule out wall motion abnormalities Troponins have been negative this admission  Mild low potassium - Replete orally with KCL 6meq  Hypotension - She is normal weight,  likely has low baseline pressure - Recently evaluated and mentating well. Evaluate for any change in mental status    DVT prophylaxsis  Lovenox  Code Status:  Full code     Family Communication: Discussed in detail with the patient/mother, all imaging results, lab results explained to the patient   Disposition Plan:  Anticipate discharge tomorrow      Consultants:  GI/general surgery    Procedures:  MRCP    Antibiotics: Anti-infectives    Start     Dose/Rate Route Frequency Ordered Stop   04/09/16 0800  clindamycin (CLEOCIN) IVPB 600 mg     600 mg 100 mL/hr over 30 Minutes Intravenous  Once 04/09/16 0755 04/09/16 1140         HPI/Subjective: Has not had a BM, afebrile,  Objective: Filed Vitals:   04/09/16 1836 04/09/16 2109 04/10/16 0317 04/10/16 0435  BP: 104/59 100/57  90/40  Pulse: 65 64  51  Temp: 97.8 F (36.6 C) 98 F (36.7 C) 98.5 F (36.9 C) 98.3 F (36.8 C)  TempSrc: Oral Oral Oral Oral  Resp: 15 15  15   Height:      Weight:      SpO2: 93% 97%  96%    Intake/Output Summary (Last 24 hours) at 04/10/16 1040 Last data filed at 04/10/16 0344  Gross per 24 hour  Intake 2052.08 ml  Output    235 ml  Net 1817.08 ml    Exam:  Examination:  General exam: Appears calm and comfortable  Respiratory system: Clear to auscultation. Respiratory effort normal. Cardiovascular  system: S1 & S2 heard, RRR. No JVD, murmurs, rubs, gallops or clicks. No pedal edema. Gastrointestinal system: Abdomen is nondistended, soft and nontender. No organomegaly or masses felt. Normal bowel sounds heard. Central nervous system: Alert and oriented. No focal neurological deficits. Extremities: Symmetric 5 x 5 power. Skin: No rashes, lesions or ulcers Psychiatry: Judgement and insight appear normal. Mood & affect appropriate.     Data Reviewed: I have personally reviewed following labs and imaging studies  Micro Results No results found for this or any  previous visit (from the past 240 hour(s)).  Radiology Reports Dg Chest 2 View  04/06/2016  CLINICAL DATA:  50 year old female with chest pain EXAM: CHEST  2 VIEW COMPARISON:  Chest radiograph dated 03/29/2015 FINDINGS: The heart size and mediastinal contours are within normal limits. Both lungs are clear. The visualized skeletal structures are unremarkable. IMPRESSION: No active cardiopulmonary disease. Electronically Signed   By: Anner Crete M.D.   On: 04/06/2016 22:16   US Abdomen Complete  04/06/2016  CLINICAL DATA:  Right back pain and epigastric pain with nausea for 8 months. History of melanoma. EXAM: ABDOMEN ULTRASOUND COMPLETE COMPARISON:  None. FINDINGS: Gallbladder: Several stones in the gallbladder, largest measuring 1.9 cm diameter. Small amount of sludge layering in the gallbladder. No gallbladder wall thickening. Murphy's sign is negative. Common bile duct: Diameter: 8 mm.  Upper limits of normal range. Liver: No focal lesion identified. Within normal limits in parenchymal echogenicity. IVC: No abnormality visualized. Pancreas: Visualized portion unremarkable. Spleen: Size and appearance within normal limits. Right Kidney: Length: 10.9 cm. Echogenicity within normal limits. No mass or hydronephrosis visualized. Left Kidney: Length: 10.8 cm. Echogenicity within normal limits. No mass or hydronephrosis visualized. Abdominal aorta: No aneurysm visualized. Other findings: None. IMPRESSION: Cholelithiasis and gallbladder sludge. No additional changes to suggest cholecystitis. Extrahepatic bile duct measures 8 mm diameter, representing upper limits of normal range. Electronically Signed   By: Lucienne Capers M.D.   On: 04/06/2016 23:35   Mr Abdomen Mrcp Wo Cm  04/08/2016  CLINICAL DATA:  Gallstone pancreatitis. Past medical history of melanoma 15 years ago. Epigastric abdominal pain. Elevated lipase and liver function tests. EXAM: MRI ABDOMEN WITHOUT CONTRAST  (INCLUDING MRCP) TECHNIQUE:  Multiplanar multisequence MR imaging of the abdomen was performed. Heavily T2-weighted images of the biliary and pancreatic ducts were obtained, and three-dimensional MRCP images were rendered by post processing. COMPARISON:  04/06/2016 abdominal ultrasound. FINDINGS: Mild degradation secondary to motion and less so lack of IV contrast. Lower chest: Trace right larger than left pleural effusions. Normal heart size. Hepatobiliary: No focal liver lesion. No intrahepatic ductal dilatation. Gallstones including at 16 mm. Moderate-to-marked gallbladder wall thickening, including at up to 12 mm on image 22/series 4. No common duct dilatation.  No choledocholithiasis. Pancreas: No pancreatic duct dilatation or dominant mass. There is nonspecific edema within the anterior pararenal space, including adjacent the tail on image 19/series 8. No peripancreatic fluid collection. Spleen: Normal in size, without focal abnormality. Adrenals/Urinary Tract: Normal adrenal glands. Normal kidneys, without hydronephrosis. Stomach/Bowel: Normal stomach and abdominal bowel loops. Vascular/Lymphatic: Normal caliber of the aorta and branch vessels. No retroperitoneal or retrocrural adenopathy. Other: Small volume perihepatic and perisplenic ascites. Musculoskeletal: No acute osseous abnormality. IMPRESSION: 1. Mild degradation secondary to motion and lack of IV contrast. 2. No evidence of biliary duct dilatation or choledocholithiasis. 3. Cholelithiasis with gallbladder wall thickening. This wall thickening is new since 04/06/2016 ultrasound. If acute cholecystitis is a concern, consider repeat ultrasound or nuclear medicine  hepatobiliary study. 4. No specific findings of pancreatitis or its complications. There is minimal ascites with nonspecific edema throughout the anterior pararenal space. 5. Small bilateral pleural effusions. Electronically Signed   By: Abigail Miyamoto M.D.   On: 04/08/2016 15:46     CBC  Recent Labs Lab  04/06/16 2148 04/07/16 0331 04/08/16 0521 04/09/16 0505 04/10/16 0555  WBC 8.6 9.4 5.6 6.2 9.0  HGB 12.7 12.1 11.5* 11.3* 11.6*  HCT 38.4 37.2 36.8 35.5* 35.3*  PLT 226 215 167 174 206  MCV 90.1 89.9 91.3 90.8 89.4  MCH 29.8 29.2 28.5 28.9 29.4  MCHC 33.1 32.5 31.3 31.8 32.9  RDW 13.7 13.8 14.3 14.0 13.8    Chemistries   Recent Labs Lab 04/06/16 2148 04/07/16 0331 04/08/16 0521 04/09/16 0505 04/10/16 0555  NA 138 142 139 140 137  K 3.8 3.4* 3.6 3.9 4.0  CL 105 111 114* 112* 106  CO2 25 24 22 22 23   GLUCOSE 109* 108* 80 75 104*  BUN 12 10 7  <5* <5*  CREATININE 0.71 0.58 0.58 0.62 0.54  CALCIUM 9.0 8.1* 7.8* 8.0* 8.3*  MG  --  2.0  --   --   --   AST 148* 427* 98* 42* 27  ALT 59* 230* 174* 130* 101*  ALKPHOS 55 58 84 84 84  BILITOT 0.6 1.4* 1.7* 1.1 1.2   ------------------------------------------------------------------------------------------------------------------ estimated creatinine clearance is 77.9 mL/min (by C-G formula based on Cr of 0.54). ------------------------------------------------------------------------------------------------------------------ No results for input(s): HGBA1C in the last 72 hours. ------------------------------------------------------------------------------------------------------------------ No results for input(s): CHOL, HDL, LDLCALC, TRIG, CHOLHDL, LDLDIRECT in the last 72 hours. ------------------------------------------------------------------------------------------------------------------ No results for input(s): TSH, T4TOTAL, T3FREE, THYROIDAB in the last 72 hours.  Invalid input(s): FREET3 ------------------------------------------------------------------------------------------------------------------ No results for input(s): VITAMINB12, FOLATE, FERRITIN, TIBC, IRON, RETICCTPCT in the last 72 hours.  Coagulation profile No results for input(s): INR, PROTIME in the last 168 hours.  No results for input(s): DDIMER in  the last 72 hours.  Cardiac Enzymes  Recent Labs Lab 04/07/16 0331 04/07/16 1030 04/07/16 1511  TROPONINI <0.03 <0.03 <0.03   ------------------------------------------------------------------------------------------------------------------ Invalid input(s): POCBNP   CBG: No results for input(s): GLUCAP in the last 168 hours.     Studies: Mr Abdomen Mrcp Wo Cm  04/08/2016  CLINICAL DATA:  Gallstone pancreatitis. Past medical history of melanoma 15 years ago. Epigastric abdominal pain. Elevated lipase and liver function tests. EXAM: MRI ABDOMEN WITHOUT CONTRAST  (INCLUDING MRCP) TECHNIQUE: Multiplanar multisequence MR imaging of the abdomen was performed. Heavily T2-weighted images of the biliary and pancreatic ducts were obtained, and three-dimensional MRCP images were rendered by post processing. COMPARISON:  04/06/2016 abdominal ultrasound. FINDINGS: Mild degradation secondary to motion and less so lack of IV contrast. Lower chest: Trace right larger than left pleural effusions. Normal heart size. Hepatobiliary: No focal liver lesion. No intrahepatic ductal dilatation. Gallstones including at 16 mm. Moderate-to-marked gallbladder wall thickening, including at up to 12 mm on image 22/series 4. No common duct dilatation.  No choledocholithiasis. Pancreas: No pancreatic duct dilatation or dominant mass. There is nonspecific edema within the anterior pararenal space, including adjacent the tail on image 19/series 8. No peripancreatic fluid collection. Spleen: Normal in size, without focal abnormality. Adrenals/Urinary Tract: Normal adrenal glands. Normal kidneys, without hydronephrosis. Stomach/Bowel: Normal stomach and abdominal bowel loops. Vascular/Lymphatic: Normal caliber of the aorta and branch vessels. No retroperitoneal or retrocrural adenopathy. Other: Small volume perihepatic and perisplenic ascites. Musculoskeletal: No acute osseous abnormality. IMPRESSION: 1. Mild degradation  secondary to  motion and lack of IV contrast. 2. No evidence of biliary duct dilatation or choledocholithiasis. 3. Cholelithiasis with gallbladder wall thickening. This wall thickening is new since 04/06/2016 ultrasound. If acute cholecystitis is a concern, consider repeat ultrasound or nuclear medicine hepatobiliary study. 4. No specific findings of pancreatitis or its complications. There is minimal ascites with nonspecific edema throughout the anterior pararenal space. 5. Small bilateral pleural effusions. Electronically Signed   By: Abigail Miyamoto M.D.   On: 04/08/2016 15:46      Lab Results  Component Value Date   HGBA1C 5.0 04/07/2016   Lab Results  Component Value Date   LDLCALC 88 04/07/2016   CREATININE 0.54 04/10/2016       Scheduled Meds: . enoxaparin (LOVENOX) injection  40 mg Subcutaneous Q24H  . sodium chloride flush  3 mL Intravenous Q12H   Continuous Infusions: . sodium chloride 75 mL/hr at 04/09/16 2101     LOS: 3 days    Time spent: >30 MINS    [facility] Pager [phone]. If 7PM-7AM, please contact night-coverage at [email or web address], password [facility] 04/10/2016, 10:40 AM  LOS: 3 days

## 2016-04-11 ENCOUNTER — Inpatient Hospital Stay (HOSPITAL_COMMUNITY): Payer: 59

## 2016-04-11 ENCOUNTER — Encounter (HOSPITAL_COMMUNITY): Payer: Self-pay | Admitting: Emergency Medicine

## 2016-04-11 ENCOUNTER — Other Ambulatory Visit (HOSPITAL_COMMUNITY): Payer: 59

## 2016-04-11 DIAGNOSIS — K9189 Other postprocedural complications and disorders of digestive system: Secondary | ICD-10-CM | POA: Diagnosis not present

## 2016-04-11 DIAGNOSIS — Z539 Procedure and treatment not carried out, unspecified reason: Secondary | ICD-10-CM | POA: Diagnosis present

## 2016-04-11 DIAGNOSIS — Z87410 Personal history of cervical dysplasia: Secondary | ICD-10-CM

## 2016-04-11 DIAGNOSIS — Y832 Surgical operation with anastomosis, bypass or graft as the cause of abnormal reaction of the patient, or of later complication, without mention of misadventure at the time of the procedure: Secondary | ICD-10-CM | POA: Diagnosis present

## 2016-04-11 DIAGNOSIS — Z87891 Personal history of nicotine dependence: Secondary | ICD-10-CM

## 2016-04-11 DIAGNOSIS — I4892 Unspecified atrial flutter: Secondary | ICD-10-CM

## 2016-04-11 DIAGNOSIS — K5900 Constipation, unspecified: Secondary | ICD-10-CM | POA: Diagnosis present

## 2016-04-11 DIAGNOSIS — R109 Unspecified abdominal pain: Secondary | ICD-10-CM | POA: Diagnosis not present

## 2016-04-11 DIAGNOSIS — Z85820 Personal history of malignant melanoma of skin: Secondary | ICD-10-CM

## 2016-04-11 DIAGNOSIS — E876 Hypokalemia: Secondary | ICD-10-CM | POA: Diagnosis present

## 2016-04-11 DIAGNOSIS — I471 Supraventricular tachycardia: Secondary | ICD-10-CM | POA: Diagnosis present

## 2016-04-11 DIAGNOSIS — D649 Anemia, unspecified: Secondary | ICD-10-CM | POA: Diagnosis not present

## 2016-04-11 DIAGNOSIS — E860 Dehydration: Secondary | ICD-10-CM | POA: Diagnosis present

## 2016-04-11 HISTORY — PX: ERCP: SHX60

## 2016-04-11 LAB — COMPREHENSIVE METABOLIC PANEL
ALBUMIN: 3.4 g/dL — AB (ref 3.5–5.0)
ALK PHOS: 76 U/L (ref 38–126)
ALT: 69 U/L — ABNORMAL HIGH (ref 14–54)
ANION GAP: 5 (ref 5–15)
AST: 19 U/L (ref 15–41)
BILIRUBIN TOTAL: 0.5 mg/dL (ref 0.3–1.2)
CALCIUM: 8.8 mg/dL — AB (ref 8.9–10.3)
CO2: 24 mmol/L (ref 22–32)
Chloride: 108 mmol/L (ref 101–111)
Creatinine, Ser: 0.65 mg/dL (ref 0.44–1.00)
GFR calc Af Amer: >60 mL/min (ref >60)
GLUCOSE: 113 mg/dL — AB (ref 65–99)
Potassium: 3.1 mmol/L — ABNORMAL LOW (ref 3.5–5.1)
Sodium: 137 mmol/L (ref 135–145)
TOTAL PROTEIN: 5.7 g/dL — AB (ref 6.5–8.1)

## 2016-04-11 LAB — URINALYSIS, ROUTINE W REFLEX MICROSCOPIC
BILIRUBIN URINE: NEGATIVE
Glucose, UA: NEGATIVE mg/dL
KETONES UR: 15 mg/dL — AB
Leukocytes, UA: NEGATIVE
NITRITE: NEGATIVE
PROTEIN: NEGATIVE mg/dL
Specific Gravity, Urine: 1.013 (ref 1.005–1.030)
pH: 5.5 (ref 5.0–8.0)

## 2016-04-11 LAB — ECHOCARDIOGRAM COMPLETE
HEIGHTINCHES: 66 in
Weight: 2368 oz

## 2016-04-11 LAB — LIPASE, BLOOD: Lipase: 33 U/L (ref 11–51)

## 2016-04-11 LAB — CBC
HCT: 36.9 % (ref 36.0–46.0)
Hemoglobin: 12.1 g/dL (ref 12.0–15.0)
MCH: 29 pg (ref 26.0–34.0)
MCHC: 32.8 g/dL (ref 30.0–36.0)
MCV: 88.5 fL (ref 78.0–100.0)
Platelets: 249 10*3/uL (ref 150–400)
RBC: 4.17 MIL/uL (ref 3.87–5.11)
RDW: 13.9 % (ref 11.5–15.5)
WBC: 7.9 10*3/uL (ref 4.0–10.5)

## 2016-04-11 LAB — URINE MICROSCOPIC-ADD ON

## 2016-04-11 MED ORDER — HYDROCODONE-ACETAMINOPHEN 10-500 MG PO TABS: 1.0000 | ORAL_TABLET | Freq: Four times a day (QID) | ORAL | Status: DC | PRN

## 2016-04-11 NOTE — ED Notes (Signed)
The patient advised she had pancreatitis,had a cholecystectomy tuesday and was discharged today.  She is back today because she started having severe abdominal pain after eating and it woke her out of her sleep.  She rates her pain 5/10.  The patient is taking tylenol for pain.  She doe still has a JP drain that has serosanguineous fluid.  Her incisions look good.  Cannot visual drain site but gauze is clean.

## 2016-04-11 NOTE — Discharge Summary (Signed)
Physician Discharge Summary  FARA WORTHY MRN: [id number] DOB/AGE: Apr 26, 1966 50 y.o.  PCP: Antony Blackbird, MD   Admit date: 04/06/2016 Discharge date: 04/11/2016  Discharge Diagnoses:     Active Problems:   Elevated LFTs   Gallstone pancreatitis   Cholelithiasis   Pancreatitis due to biliary obstruction   Cholelithiasis without cholecystitis   Elevated liver function tests    Follow-up recommendations Follow-up with PCP in 3-5 days , including all  additional recommended appointments as below Follow-up CBC, CMP in 3-5 days Patient is a Dr. Donne Hazel on 7/21-Her JP drain will remain in place until she sees Dr. Donne Hazel       Current Discharge Medication List    CONTINUE these medications which have NOT CHANGED   Details  levonorgestrel (MIRENA) 20 MCG/24HR IUD 1 each by Intrauterine route once. Inserted 04/06/07.       STOP taking these medications     azithromycin (ZITHROMAX) 500 MG tablet          Discharge Condition: Stable   Discharge Instructions Get Medicines reviewed and adjusted: Please take all your medications with you for your next visit with your Primary MD  Please request your Primary MD to go over all hospital tests and procedure/radiological results at the follow up, please ask your Primary MD to get all Hospital records sent to his/her office.  If you experience worsening of your admission symptoms, develop shortness of breath, life threatening emergency, suicidal or homicidal thoughts you must seek medical attention immediately by calling 911 or calling your MD immediately if symptoms less severe.  You must read complete instructions/literature along with all the possible adverse reactions/side effects for all the Medicines you take and that have been prescribed to you. Take any new Medicines after you have completely understood and accpet all the possible adverse reactions/side effects.   Do not drive when taking Pain medications.   Do  not take more than prescribed Pain, Sleep and Anxiety Medications  Special Instructions: If you have smoked or chewed Tobacco in the last 2 yrs please stop smoking, stop any regular Alcohol and or any Recreational drug use.  Wear Seat belts while driving.  Please note  You were cared for by a hospitalist during your hospital stay. Once you are discharged, your primary care physician will handle any further medical issues. Please note that NO REFILLS for any discharge medications will be authorized once you are discharged, as it is imperative that you return to your primary care physician (or establish a relationship with a primary care physician if you do not have one) for your aftercare needs so that they can reassess your need for medications and monitor your lab values.  Discharge Instructions    Diet - low sodium heart healthy    Complete by:  As directed      Increase activity slowly    Complete by:  As directed             Allergies  Allergen Reactions  . Iodine Swelling  . Other Hives and Swelling    Seafood  . Ampicillin   . Levofloxacin   . Septra [Bactrim]       Disposition:    Consults:  GI Gen. surgery     Significant Diagnostic Studies:  Dg Chest 2 View  04/06/2016  CLINICAL DATA:  50 year old female with chest pain EXAM: CHEST  2 VIEW COMPARISON:  Chest radiograph dated 03/29/2015 FINDINGS: The heart size and mediastinal contours are within normal limits.  Both lungs are clear. The visualized skeletal structures are unremarkable. IMPRESSION: No active cardiopulmonary disease. Electronically Signed   By: Anner Crete M.D.   On: 04/06/2016 22:16   US Abdomen Complete  04/06/2016  CLINICAL DATA:  Right back pain and epigastric pain with nausea for 8 months. History of melanoma. EXAM: ABDOMEN ULTRASOUND COMPLETE COMPARISON:  None. FINDINGS: Gallbladder: Several stones in the gallbladder, largest measuring 1.9 cm diameter. Small amount of sludge layering  in the gallbladder. No gallbladder wall thickening. Murphy's sign is negative. Common bile duct: Diameter: 8 mm.  Upper limits of normal range. Liver: No focal lesion identified. Within normal limits in parenchymal echogenicity. IVC: No abnormality visualized. Pancreas: Visualized portion unremarkable. Spleen: Size and appearance within normal limits. Right Kidney: Length: 10.9 cm. Echogenicity within normal limits. No mass or hydronephrosis visualized. Left Kidney: Length: 10.8 cm. Echogenicity within normal limits. No mass or hydronephrosis visualized. Abdominal aorta: No aneurysm visualized. Other findings: None. IMPRESSION: Cholelithiasis and gallbladder sludge. No additional changes to suggest cholecystitis. Extrahepatic bile duct measures 8 mm diameter, representing upper limits of normal range. Electronically Signed   By: Lucienne Capers M.D.   On: 04/06/2016 23:35   Mr Abdomen Mrcp Wo Cm  04/08/2016  CLINICAL DATA:  Gallstone pancreatitis. Past medical history of melanoma 15 years ago. Epigastric abdominal pain. Elevated lipase and liver function tests. EXAM: MRI ABDOMEN WITHOUT CONTRAST  (INCLUDING MRCP) TECHNIQUE: Multiplanar multisequence MR imaging of the abdomen was performed. Heavily T2-weighted images of the biliary and pancreatic ducts were obtained, and three-dimensional MRCP images were rendered by post processing. COMPARISON:  04/06/2016 abdominal ultrasound. FINDINGS: Mild degradation secondary to motion and less so lack of IV contrast. Lower chest: Trace right larger than left pleural effusions. Normal heart size. Hepatobiliary: No focal liver lesion. No intrahepatic ductal dilatation. Gallstones including at 16 mm. Moderate-to-marked gallbladder wall thickening, including at up to 12 mm on image 22/series 4. No common duct dilatation.  No choledocholithiasis. Pancreas: No pancreatic duct dilatation or dominant mass. There is nonspecific edema within the anterior pararenal space, including  adjacent the tail on image 19/series 8. No peripancreatic fluid collection. Spleen: Normal in size, without focal abnormality. Adrenals/Urinary Tract: Normal adrenal glands. Normal kidneys, without hydronephrosis. Stomach/Bowel: Normal stomach and abdominal bowel loops. Vascular/Lymphatic: Normal caliber of the aorta and branch vessels. No retroperitoneal or retrocrural adenopathy. Other: Small volume perihepatic and perisplenic ascites. Musculoskeletal: No acute osseous abnormality. IMPRESSION: 1. Mild degradation secondary to motion and lack of IV contrast. 2. No evidence of biliary duct dilatation or choledocholithiasis. 3. Cholelithiasis with gallbladder wall thickening. This wall thickening is new since 04/06/2016 ultrasound. If acute cholecystitis is a concern, consider repeat ultrasound or nuclear medicine hepatobiliary study. 4. No specific findings of pancreatitis or its complications. There is minimal ascites with nonspecific edema throughout the anterior pararenal space. 5. Small bilateral pleural effusions. Electronically Signed   By: Abigail Miyamoto M.D.   On: 04/08/2016 15:46    2-D echo-pending   Filed Weights   04/07/16 0120 04/09/16 0611 04/10/16 2126  Weight: 65.772 kg (145 lb) 67.2 kg (148 lb 2.4 oz) 67.132 kg (148 lb)     Microbiology: No results found for this or any previous visit (from the past 240 hour(s)).     Blood Culture No results found for: SDES, [place], CULT, REPTSTATUS    Labs: Results for orders placed or performed during the hospital encounter of 04/06/16 (from the past 48 hour(s))  Comprehensive metabolic panel  Status: Abnormal   Collection Time: 04/10/16  5:55 AM  Result Value Ref Range   Sodium 137 135 - 145 mmol/L   Potassium 4.0 3.5 - 5.1 mmol/L   Chloride 106 101 - 111 mmol/L   CO2 23 22 - 32 mmol/L   Glucose, Bld 104 (H) 65 - 99 mg/dL   BUN <5 (L) 6 - 20 mg/dL   Creatinine, Ser 0.54 0.44 - 1.00 mg/dL   Calcium 8.3 (L) 8.9 - 10.3 mg/dL    Total Protein 5.2 (L) 6.5 - 8.1 g/dL   Albumin 3.1 (L) 3.5 - 5.0 g/dL   AST 27 15 - 41 U/L   ALT 101 (H) 14 - 54 U/L   Alkaline Phosphatase 84 38 - 126 U/L   Total Bilirubin 1.2 0.3 - 1.2 mg/dL   GFR calc non Af Amer >60 >60 mL/min   GFR calc Af Amer >60 >60 mL/min    Comment: (NOTE) The eGFR has been calculated using the CKD EPI equation. This calculation has not been validated in all clinical situations. eGFR's persistently <60 mL/min signify possible Chronic Kidney Disease.    Anion gap 8 5 - 15  CBC     Status: Abnormal   Collection Time: 04/10/16  5:55 AM  Result Value Ref Range   WBC 9.0 4.0 - 10.5 K/uL   RBC 3.95 3.87 - 5.11 MIL/uL   Hemoglobin 11.6 (L) 12.0 - 15.0 g/dL   HCT 35.3 (L) 36.0 - 46.0 %   MCV 89.4 78.0 - 100.0 fL   MCH 29.4 26.0 - 34.0 pg   MCHC 32.9 30.0 - 36.0 g/dL   RDW 13.8 11.5 - 15.5 %   Platelets 206 150 - 400 K/uL  TSH     Status: None   Collection Time: 04/10/16 11:04 AM  Result Value Ref Range   TSH 0.457 0.350 - 4.500 uIU/mL  T4, free     Status: None   Collection Time: 04/10/16 11:04 AM  Result Value Ref Range   Free T4 0.97 0.61 - 1.12 ng/dL    Comment: (NOTE) Biotin ingestion may interfere with free T4 tests. If the results are inconsistent with the TSH level, previous test results, or the clinical presentation, then consider biotin interference. If needed, order repeat testing after stopping biotin.      Lipid Panel     Component Value Date/Time   CHOL 147 04/07/2016 0331   TRIG 57 04/07/2016 0331   HDL 48 04/07/2016 0331   CHOLHDL 3.1 04/07/2016 0331   VLDL 11 04/07/2016 0331   LDLCALC 88 04/07/2016 0331     Lab Results  Component Value Date   HGBA1C 5.0 04/07/2016     Lab Results  Component Value Date   LDLCALC 88 04/07/2016   CREATININE 0.54 04/10/2016     HPI :  50 y/o female with a PMH of melanoma 15 years ago admitted on 04/07/16 with epigastric abdominal pain that radiated to her right shoulder. She has  experienced similar, but less severe episodes of pain in the past. RUQ U/S revealed gallstones and an 8 mm CBD. Labs on admission as follows: Lipase 1341, AST 148, ALT 59, T.bili 0.6. In the emergency room she had an abdominal ultrasound which showed gallstones. Her CBD was 8 mm. Her lipase was 1341 on admission, yesterday but today has jumped up to 5793. Now 77. Patient status post cholecystectomy on 7/18  HOSPITAL COURSE:   Acute gallstone pancreatitis-ultrasound shows cholelithiasis and gallbladder sludge, common  bile duct is 8 mm MRCP distal arch show any evidence of biliary obstruction or choledocholithiasis.3. Cholelithiasis with gallbladder wall thickening. Surgery consulted POD# 2 laparoscopic cholecystectomy - 04/09/16 Dr. Serita Grammes, JP drain in place. Patient was told this will be removed on Friday. Patient not comfortable going home today because she still has some pain, nausea, has not been advanced to a regular diet - TG low   - WBC normal , lipase has improved from 5793> 363 >65 - Continue IVF with NS at 150cc/hr overnight, reassess in the morning - TnI X 3 negative Advance patient to a soft diet   SVT TSH and free T4 are normal, 2-D echo to rule out wall motion abnormalities, valvular issues, pending, Troponins have been negative this admission Will complete echo and discharge, requests PCP to follow-up on the results of the 2-D echo  Mild low potassium - Replete orally with KCL 28mq  Hypotension - She is normal weight, likely has low baseline pressure - Recently evaluated and mentating well. Evaluate for any change in mental status  Discharge Exam:    Blood pressure 97/46, pulse 60, temperature 98.8 F (37.1 C), temperature source Oral, resp. rate 19, height 5' 6" (1.676 m), weight 67.132 kg (148 lb), SpO2 96 %.  General exam: Appears calm and comfortable  Respiratory system: Clear to auscultation. Respiratory effort normal. Cardiovascular system: S1 & S2  heard, RRR. No JVD, murmurs, rubs, gallops or clicks. No pedal edema. Gastrointestinal system: Abdomen is nondistended, soft and nontender. No organomegaly or masses felt. Normal bowel sounds heard. Central nervous system: Alert and oriented. No focal neurological deficits. Extremities: Symmetric 5 x 5 power. Skin: No rashes, lesions or ulcers Psychiatry: Judgement and insight appear normal. Mood & affect appropriate.     Follow-up Information    [facility] Go in 2 days.   Specialty:  General Surgery   Why:  For post-operative follow up.   Contact information:   [address] [place] [phone]      Follow up with FULP, CAMMIE, MD. Schedule an appointment as soon as possible for a visit in 3 days.   Specialty:  Family Medicine   Why:  Hospital follow-up   Contact information:   [id number] [id number]      Follow up with WRolm Bookbinder MD. Schedule an appointment as soon as possible for a visit on 04/12/2016.   Specialty:  General Surgery   Contact information:   [address] [place] [phone]      Signed: AReyne Dumas7/20/2017, 8:12 AM        Time spent >45 mins

## 2016-04-11 NOTE — Discharge Instructions (Signed)
Your appointment with Dr. Donne Hazel is this Friday 04/12/16, our office will call you and give you your appointment time. Please arrive at least 30 min before your appointment to complete your check in paperwork.  If you are unable to arrive 30 min prior to your appointment time we may have to cancel or reschedule you.  LAPAROSCOPIC SURGERY: POST OP INSTRUCTIONS  1. DIET: Follow a light bland diet the first 24 hours after arrival home, such as soup, liquids, crackers, etc. Be sure to include lots of fluids daily. Avoid fast food or heavy meals as your are more likely to get nauseated. Eat a low fat the next few days after surgery.  2. Take your usually prescribed home medications unless otherwise directed. 3. PAIN CONTROL:  1. Pain is best controlled by a usual combination of three different methods TOGETHER:  1. Ice/Heat 2. Over the counter pain medication 3. Prescription pain medication 2. Most patients will experience some swelling and bruising around the incisions. Ice packs or heating pads (30-60 minutes up to 6 times a day) will help. Use ice for the first few days to help decrease swelling and bruising, then switch to heat to help relax tight/sore spots and speed recovery. Some people prefer to use ice alone, heat alone, alternating between ice & heat. Experiment to what works for you. Swelling and bruising can take several weeks to resolve.  3. It is helpful to take an over-the-counter pain medication regularly for the first few weeks. Choose one of the following that works best for you:  1. Naproxen (Aleve, etc) Two 220mg  tabs twice a day 2. Ibuprofen (Advil, etc) Three 200mg  tabs four times a day (every meal & bedtime) 3. Acetaminophen (Tylenol, etc) 500-650mg  four times a day (every meal & bedtime) 4. A prescription for pain medication (such as oxycodone, hydrocodone, etc) should be given to you upon discharge. Take your pain medication as prescribed.  1. If you are having problems/concerns  with the prescription medicine (does not control pain, nausea, vomiting, rash, itching, etc), please call us [phone] to see if we need to switch you to a different pain medicine that will work better for you and/or control your side effect better. 2. If you need a refill on your pain medication, please contact your pharmacy. They will contact our office to request authorization. Prescriptions will not be filled after 5 pm or on week-ends. 4. Avoid getting constipated. Between the surgery and the pain medications, it is common to experience some constipation. Increasing fluid intake and taking a fiber supplement (such as Metamucil, Citrucel, FiberCon, MiraLax, etc) 1-2 times a day regularly will usually help prevent this problem from occurring. A mild laxative (prune juice, Milk of Magnesia, MiraLax, etc) should be taken according to package directions if there are no bowel movements after 48 hours.  5. Watch out for diarrhea. If you have many loose bowel movements, simplify your diet to bland foods & liquids for a few days. Stop any stool softeners and decrease your fiber supplement. Switching to mild anti-diarrheal medications (Kayopectate, Pepto Bismol) can help. If this worsens or does not improve, please call us. 6. Wash / shower every day. You may shower over the dressings as they are waterproof. Continue to shower over incision(s) after the dressing is off. 7. Remove your waterproof bandages 5 days after surgery. You may leave the incision open to air. You may replace a dressing/Band-Aid to cover the incision for comfort if you wish.  8. ACTIVITIES as  tolerated:  1. You may resume regular (light) daily activities beginning the next day--such as daily self-care, walking, climbing stairs--gradually increasing activities as tolerated. If you can walk 30 minutes without difficulty, it is safe to try more intense activity such as jogging, treadmill, bicycling, low-impact aerobics, swimming,  etc. 2. Save the most intensive and strenuous activity for last such as sit-ups, heavy lifting, contact sports, etc Refrain from any heavy lifting or straining until you are off narcotics for pain control.  3. DO NOT PUSH THROUGH PAIN. Let pain be your guide: If it hurts to do something, don't do it. Pain is your body warning you to avoid that activity for another week until the pain goes down. 4. You may drive when you are no longer taking prescription pain medication, you can comfortably wear a seatbelt, and you can safely maneuver your car and apply brakes. 5. You may have sexual intercourse when it is comfortable.  9. FOLLOW UP in our office  1. Please call [facility] at [phone] to set up an appointment to see your surgeon in the office for a follow-up appointment approximately 2-3 weeks after your surgery. 2. Make sure that you call for this appointment the day you arrive home to insure a convenient appointment time.      10. IF YOU HAVE DISABILITY OR FAMILY LEAVE FORMS, BRING THEM TO THE               OFFICE FOR PROCESSING.   WHEN TO CALL us [phone]:  1. Poor pain control 2. Reactions / problems with new medications (rash/itching, nausea, etc)  3. Fever over 101.5 F (38.5 C) 4. Inability to urinate 5. Nausea and/or vomiting 6. Worsening swelling or bruising 7. Continued bleeding from incision. 8. Increased pain, redness, or drainage from the incision  The clinic staff is available to answer your questions during regular business hours (8:30am-5pm). Please dont hesitate to call and ask to speak to one of our nurses for clinical concerns.  If you have a medical emergency, go to the nearest emergency room or call 911.  A surgeon from [facility] is always on call at the [facility], [place], [place], [place], [place] [address] ?  MAIN: [phone] ? TOLL FREE: [phone] ?  FAX [phone]   [email or web address]

## 2016-04-11 NOTE — Progress Notes (Signed)
  Echocardiogram 2D Echocardiogram has been performed.  Darlina Sicilian M 04/11/2016, 10:00 AM

## 2016-04-11 NOTE — Progress Notes (Signed)
[facility] Progress Note  2 Days Post-Op  Subjective: Abdominal soreness and some nausea with eating, both of which are improving. Tolerating PO. +flatus. No BM  Objective: Vital signs in last 24 hours: Temp:  [97.5 F (36.4 C)-98.9 F (37.2 C)] 98.8 F (37.1 C) (07/20 0459) Pulse Rate:  [60-64] 60 (07/20 0459) Resp:  [16-19] 19 (07/20 0459) BP: (95-105)/(46-49) 97/46 mmHg (07/20 0459) SpO2:  [96 %-98 %] 96 % (07/20 0459) Weight:  [67.132 kg (148 lb)] 67.132 kg (148 lb) (07/19 2126) Last BM Date: 04/08/16  Intake/Output from previous day: 07/19 0701 - 07/20 0700 In: 2325 [P.O.:600; I.V.:1725] Out: 439 [Urine:304; Drains:135] Intake/Output this shift:    PE: Gen:  Alert, NAD, pleasant Card:  RRR, no M/G/R heard Pulm:  CTA, no W/R/R Abd: Soft, appropriately tender,ND, +BS, RUQ in place with serosanguinous drainage.   Lab Results:   Recent Labs  04/09/16 0505 04/10/16 0555  WBC 6.2 9.0  HGB 11.3* 11.6*  HCT 35.5* 35.3*  PLT 174 206   BMET  Recent Labs  04/09/16 0505 04/10/16 0555  NA 140 137  K 3.9 4.0  CL 112* 106  CO2 22 23  GLUCOSE 75 104*  BUN <5* <5*  CREATININE 0.62 0.54  CALCIUM 8.0* 8.3*   PT/INR No results for input(s): LABPROT, INR in the last 72 hours. CMP     Component Value Date/Time   NA 137 04/10/2016 0555   K 4.0 04/10/2016 0555   CL 106 04/10/2016 0555   CO2 23 04/10/2016 0555   GLUCOSE 104* 04/10/2016 0555   BUN <5* 04/10/2016 0555   CREATININE 0.54 04/10/2016 0555   CREATININE 0.64 04/28/2014 0900   CALCIUM 8.3* 04/10/2016 0555   PROT 5.2* 04/10/2016 0555   ALBUMIN 3.1* 04/10/2016 0555   AST 27 04/10/2016 0555   ALT 101* 04/10/2016 0555   ALKPHOS 84 04/10/2016 0555   BILITOT 1.2 04/10/2016 0555   GFRNONAA >60 04/10/2016 0555   GFRAA >60 04/10/2016 0555   Lipase     Component Value Date/Time   LIPASE 65* 04/09/2016 0505   Studies/Results: No results found.  Anti-infectives: Anti-infectives    Start     Dose/Rate Route Frequency Ordered Stop   04/09/16 0800  clindamycin (CLEOCIN) IVPB 600 mg     600 mg 100 mL/hr over 30 Minutes Intravenous  Once 04/09/16 0755 04/09/16 1140     Assessment/Plan Gallstone pancreatitis Cholelithiasis  POD#2 laparoscopic cholecystectomy - 04/09/16 Dr. Serita Grammes  Plan: from a surgical standpoint, the patient is ready for discharge. Her JP drain will remain in place until she sees Dr. Donne Hazel on Friday (tomorrow) 7/21. She can continue tylenol for pain and will follow up at our office again in 2 weeks. I will place her follow-up instructions in her discharge paperwork. Lifting and bathing restrictions discussed with the patient.   LOS: 4 days    Jill Alexanders , [facility] 04/11/2016, 9:20 AM Pager: [phone] Consults: [phone] Mon-Fri 7:00 am-4:30 pm Sat-Sun 7:00 am-11:30 am

## 2016-04-11 NOTE — Progress Notes (Signed)
Patient discharged to home. Patients mother here to take patient home via car. IV removed. Telemetry discontinued. Dishcharge instructions reviewed. Follow-up appts made and reviewed. Script for pain with patient. All patients belongings in tow. Patient left floor in stable condition.   Sheliah Plane RN

## 2016-04-12 ENCOUNTER — Inpatient Hospital Stay (HOSPITAL_COMMUNITY)
Admission: EM | Admit: 2016-04-12 | Discharge: 2016-04-18 | DRG: 394 | Disposition: A | Discharge status: Home / Self Care | Payer: 59 | Attending: Internal Medicine | Admitting: Internal Medicine

## 2016-04-12 ENCOUNTER — Observation Stay (HOSPITAL_COMMUNITY): Payer: 59 | Admitting: Anesthesiology

## 2016-04-12 ENCOUNTER — Emergency Department (HOSPITAL_COMMUNITY): Payer: 59

## 2016-04-12 ENCOUNTER — Encounter (HOSPITAL_COMMUNITY)
Admission: EM | Disposition: A | Discharge status: Home / Self Care | Payer: Self-pay | Source: Home / Self Care | Attending: Internal Medicine

## 2016-04-12 ENCOUNTER — Encounter (HOSPITAL_COMMUNITY): Payer: Self-pay | Admitting: *Deleted

## 2016-04-12 ENCOUNTER — Observation Stay (HOSPITAL_COMMUNITY): Payer: 59

## 2016-04-12 DIAGNOSIS — Z9049 Acquired absence of other specified parts of digestive tract: Secondary | ICD-10-CM

## 2016-04-12 DIAGNOSIS — K929 Disease of digestive system, unspecified: Secondary | ICD-10-CM | POA: Diagnosis not present

## 2016-04-12 DIAGNOSIS — K839 Disease of biliary tract, unspecified: Secondary | ICD-10-CM

## 2016-04-12 DIAGNOSIS — E860 Dehydration: Secondary | ICD-10-CM | POA: Diagnosis not present

## 2016-04-12 DIAGNOSIS — R140 Abdominal distension (gaseous): Secondary | ICD-10-CM

## 2016-04-12 DIAGNOSIS — I471 Supraventricular tachycardia, unspecified: Secondary | ICD-10-CM | POA: Diagnosis present

## 2016-04-12 DIAGNOSIS — K651 Peritoneal abscess: Secondary | ICD-10-CM

## 2016-04-12 DIAGNOSIS — G8918 Other acute postprocedural pain: Secondary | ICD-10-CM

## 2016-04-12 DIAGNOSIS — K838 Other specified diseases of biliary tract: Secondary | ICD-10-CM | POA: Diagnosis present

## 2016-04-12 DIAGNOSIS — K9189 Other postprocedural complications and disorders of digestive system: Secondary | ICD-10-CM

## 2016-04-12 DIAGNOSIS — K851 Biliary acute pancreatitis without necrosis or infection: Secondary | ICD-10-CM | POA: Diagnosis present

## 2016-04-12 DIAGNOSIS — E876 Hypokalemia: Secondary | ICD-10-CM | POA: Diagnosis present

## 2016-04-12 HISTORY — PX: ERCP: SHX5425

## 2016-04-12 HISTORY — DX: Family history of other specified conditions: Z84.89

## 2016-04-12 LAB — CBC WITH DIFFERENTIAL/PLATELET
Basophils Absolute: 0 10*3/uL (ref 0.0–0.1)
Basophils Relative: 0 %
EOS ABS: 0.1 10*3/uL (ref 0.0–0.7)
Eosinophils Relative: 1 %
HEMATOCRIT: 37.8 % (ref 36.0–46.0)
HEMOGLOBIN: 12.3 g/dL (ref 12.0–15.0)
LYMPHS ABS: 0.6 10*3/uL — AB (ref 0.7–4.0)
LYMPHS PCT: 7 %
MCH: 29.2 pg (ref 26.0–34.0)
MCHC: 32.5 g/dL (ref 30.0–36.0)
MCV: 89.8 fL (ref 78.0–100.0)
Monocytes Absolute: 0.2 10*3/uL (ref 0.1–1.0)
Monocytes Relative: 2 %
NEUTROS ABS: 7.1 10*3/uL (ref 1.7–7.7)
NEUTROS PCT: 90 %
Platelets: 228 10*3/uL (ref 150–400)
RBC: 4.21 MIL/uL (ref 3.87–5.11)
RDW: 13.9 % (ref 11.5–15.5)
WBC: 8 10*3/uL (ref 4.0–10.5)

## 2016-04-12 LAB — MAGNESIUM: MAGNESIUM: 1.8 mg/dL (ref 1.7–2.4)

## 2016-04-12 LAB — PHOSPHORUS: Phosphorus: 3.2 mg/dL (ref 2.5–4.6)

## 2016-04-12 LAB — TROPONIN I: Troponin I: <0.03 ng/mL (ref <0.03)

## 2016-04-12 SURGERY — ERCP, WITH INTERVENTION IF INDICATED
Anesthesia: General

## 2016-04-12 MED ORDER — PHENOL 1.4 % MT LIQD: 1.0000 | OROMUCOSAL | Status: DC | PRN

## 2016-04-12 MED ORDER — PROMETHAZINE HCL 25 MG/ML IJ SOLN: 6.2500 mg | INTRAMUSCULAR | Status: DC | PRN

## 2016-04-12 MED ORDER — ONDANSETRON HCL 4 MG/2ML IJ SOLN
4.0000 mg | Freq: Three times a day (TID) | INTRAMUSCULAR | Status: DC | PRN
  Administered 2016-04-12: 4 mg via INTRAVENOUS
  Filled 2016-04-12: qty 2

## 2016-04-12 MED ORDER — ONDANSETRON HCL 4 MG/2ML IJ SOLN: 4.0000 mg | Freq: Four times a day (QID) | INTRAMUSCULAR | Status: DC | PRN

## 2016-04-12 MED ORDER — KETOROLAC TROMETHAMINE 15 MG/ML IJ SOLN
15.0000 mg | Freq: Four times a day (QID) | INTRAMUSCULAR | Status: DC | PRN
  Administered 2016-04-12: 15 mg via INTRAVENOUS
  Filled 2016-04-12: qty 1

## 2016-04-12 MED ORDER — EPHEDRINE SULFATE 50 MG/ML IJ SOLN
INTRAMUSCULAR | Status: DC | PRN
  Administered 2016-04-12: 10 mg via INTRAVENOUS

## 2016-04-12 MED ORDER — IOPAMIDOL (ISOVUE-300) INJECTION 61%
INTRAVENOUS | Status: DC | PRN
Start: 2016-04-12 — End: 2016-04-12
  Administered 2016-04-12: 25 mL via INTRAVENOUS

## 2016-04-12 MED ORDER — ONDANSETRON HCL 4 MG/2ML IJ SOLN
4.0000 mg | Freq: Four times a day (QID) | INTRAMUSCULAR | Status: DC | PRN
  Administered 2016-04-12 – 2016-04-15 (×3): 4 mg via INTRAVENOUS
  Filled 2016-04-12 (×2): qty 2

## 2016-04-12 MED ORDER — FENTANYL CITRATE (PF) 100 MCG/2ML IJ SOLN: 25.0000 ug | INTRAMUSCULAR | Status: DC | PRN

## 2016-04-12 MED ORDER — MIDAZOLAM HCL 2 MG/2ML IJ SOLN: 0.5000 mg | Freq: Once | INTRAMUSCULAR | Status: DC | PRN

## 2016-04-12 MED ORDER — PROMETHAZINE HCL 25 MG/ML IJ SOLN
12.5000 mg | Freq: Four times a day (QID) | INTRAMUSCULAR | Status: DC | PRN
  Administered 2016-04-15: 12.5 mg via INTRAVENOUS
  Filled 2016-04-12 (×2): qty 1

## 2016-04-12 MED ORDER — METHYLPREDNISOLONE SODIUM SUCC 125 MG IJ SOLR
60.0000 mg | Freq: Once | INTRAMUSCULAR | Status: AC
  Administered 2016-04-12: 60 mg via INTRAVENOUS

## 2016-04-12 MED ORDER — SUCCINYLCHOLINE CHLORIDE 20 MG/ML IJ SOLN
INTRAMUSCULAR | Status: DC | PRN
  Administered 2016-04-12: 100 mg via INTRAVENOUS

## 2016-04-12 MED ORDER — MEPERIDINE HCL 100 MG/ML IJ SOLN: 6.2500 mg | INTRAMUSCULAR | Status: DC | PRN

## 2016-04-12 MED ORDER — ACETAMINOPHEN 325 MG PO TABS
650.0000 mg | ORAL_TABLET | ORAL | Status: DC | PRN
  Administered 2016-04-13 – 2016-04-18 (×12): 650 mg via ORAL
  Filled 2016-04-12 (×11): qty 2

## 2016-04-12 MED ORDER — DIPHENHYDRAMINE HCL 50 MG/ML IJ SOLN
50.0000 mg | Freq: Once | INTRAMUSCULAR | Status: AC
  Administered 2016-04-12: 50 mg via INTRAVENOUS

## 2016-04-12 MED ORDER — MENTHOL 3 MG MT LOZG: 1.0000 | LOZENGE | OROMUCOSAL | Status: DC | PRN

## 2016-04-12 MED ORDER — BARIUM SULFATE 2.1 % PO SUSP
ORAL | Status: AC
  Filled 2016-04-12: qty 1

## 2016-04-12 MED ORDER — KETOROLAC TROMETHAMINE 30 MG/ML IJ SOLN
30.0000 mg | Freq: Once | INTRAMUSCULAR | Status: AC
  Administered 2016-04-12: 30 mg via INTRAVENOUS
  Filled 2016-04-12: qty 1

## 2016-04-12 MED ORDER — METHYLPREDNISOLONE SODIUM SUCC 125 MG IJ SOLR
INTRAMUSCULAR | Status: AC
  Filled 2016-04-12: qty 2

## 2016-04-12 MED ORDER — PANTOPRAZOLE SODIUM 40 MG PO TBEC
40.0000 mg | DELAYED_RELEASE_TABLET | Freq: Once | ORAL | Status: AC
  Administered 2016-04-12: 40 mg via ORAL
  Filled 2016-04-12: qty 1

## 2016-04-12 MED ORDER — LACTATED RINGERS IV SOLN
INTRAVENOUS | Status: DC
  Administered 2016-04-12 (×2): via INTRAVENOUS

## 2016-04-12 MED ORDER — KCL IN DEXTROSE-NACL 20-5-0.9 MEQ/L-%-% IV SOLN
INTRAVENOUS | Status: DC
  Administered 2016-04-12 – 2016-04-14 (×4): via INTRAVENOUS
  Filled 2016-04-12 (×15): qty 1000

## 2016-04-12 MED ORDER — LIDOCAINE HCL (CARDIAC) 20 MG/ML IV SOLN
INTRAVENOUS | Status: DC | PRN
  Administered 2016-04-12: 100 mg via INTRATRACHEAL
  Administered 2016-04-12: 100 mg via INTRAVENOUS

## 2016-04-12 MED ORDER — GI COCKTAIL ~~LOC~~
30.0000 mL | Freq: Once | ORAL | Status: AC
  Administered 2016-04-12: 30 mL via ORAL
  Filled 2016-04-12: qty 30

## 2016-04-12 MED ORDER — ENOXAPARIN SODIUM 40 MG/0.4ML ~~LOC~~ SOLN: 40.0000 mg | SUBCUTANEOUS | Status: DC

## 2016-04-12 MED ORDER — DIPHENHYDRAMINE HCL 50 MG/ML IJ SOLN
INTRAMUSCULAR | Status: AC
  Filled 2016-04-12: qty 1

## 2016-04-12 MED ORDER — MORPHINE SULFATE (PF) 2 MG/ML IV SOLN
2.0000 mg | INTRAVENOUS | Status: DC | PRN
  Administered 2016-04-12 – 2016-04-16 (×8): 2 mg via INTRAVENOUS
  Filled 2016-04-12 (×8): qty 1

## 2016-04-12 MED ORDER — IOPAMIDOL (ISOVUE-300) INJECTION 61%
INTRAVENOUS | Status: AC
  Filled 2016-04-12: qty 50

## 2016-04-12 MED ORDER — PROPOFOL 10 MG/ML IV BOLUS
INTRAVENOUS | Status: DC | PRN
  Administered 2016-04-12: 150 mg via INTRAVENOUS

## 2016-04-12 MED ORDER — MIDAZOLAM HCL 5 MG/5ML IJ SOLN
INTRAMUSCULAR | Status: DC | PRN
  Administered 2016-04-12: 2 mg via INTRAVENOUS

## 2016-04-12 MED ORDER — FAMOTIDINE IN NACL 20-0.9 MG/50ML-% IV SOLN
20.0000 mg | Freq: Two times a day (BID) | INTRAVENOUS | Status: DC
  Administered 2016-04-12 – 2016-04-16 (×8): 20 mg via INTRAVENOUS
  Filled 2016-04-12 (×12): qty 50

## 2016-04-12 MED ORDER — SODIUM CHLORIDE 0.9 % IV SOLN: INTRAVENOUS | Status: DC

## 2016-04-12 MED ORDER — SODIUM CHLORIDE 0.9 % IV BOLUS (SEPSIS)
1000.0000 mL | Freq: Once | INTRAVENOUS | Status: AC
  Administered 2016-04-12: 1000 mL via INTRAVENOUS

## 2016-04-12 MED ORDER — ONDANSETRON HCL 4 MG/2ML IJ SOLN
4.0000 mg | Freq: Once | INTRAMUSCULAR | Status: AC
  Administered 2016-04-12: 4 mg via INTRAVENOUS
  Filled 2016-04-12: qty 2

## 2016-04-12 NOTE — Progress Notes (Signed)
Rosamae Mccarten Hillmann 10:02 AM  Subjective: Patient familiar to me from recent hospital stay and case discussed with my partner Dr. Michail Sermon who discussed the case with the patient clear physician in the surgeon last night  Objective: Vital signs stable afebrile no acute distress exam please see preassessment evaluation bile draining in drain labs repeat CT and ultrasound reviewed  Assessment: Almost certainly a bile leak  Plan: The risks benefits methods and success rate of ERCP with minimal dye injected and stent placement and sphincterotomy discussed with both the patient and her husband and will proceed this morning with further workup and plans pending those findings and we answered all of their questions  [facility]  Pager [phone] After 5PM or if no answer call [phone]

## 2016-04-12 NOTE — H&P (Signed)
History and Physical    Laurie Terry [id number] DOB: 1965/10/29 DOA: 04/12/2016   PCP: Antony Blackbird, MD   Patient coming from/Resides with: Private residence/lives with spouse  Chief Complaint: Acute post prandial abdominal pain with nausea  HPI: Laurie Terry is a 51 y.o. female with medical history significant for remote melanoma 15 years ago. Patient was recently discharged yesterday 7/20 after an admission for biliary pancreatitis. She underwent laparoscopic cholecystectomy, JP drain was left in place, lipase had normalized and patient was tolerating soft diet therefore was discharged home. After arriving home patient began developing increasing epigastric abdominal pain after eating associated with significant nausea. After eating her second meal the pain became continuous so patient presented to the ER for evaluation. She has had chills but no fever. She also noticed that the drainage from her JP drain had changed in color to a dark green and had increased in volume. The patient has been evaluated by general surgery in the ER and a hepatobiliary scan is planned due to concerns of possible postoperative bile leak.  ED Course:  Vital signs: 98.6-101/65-68-16-room air saturations 96% CT abdomen and pelvis without contrast: Demonstrated appropriate placement of surgical drain in the subhepatic space, no biliary distention, no visible choledocholithiasis therefore no unexpected finding after recent cholecystectomy; trace pleural effusions with mild basilar atelectasis Limited right upper quadrant abdominal ultrasound: Status post cholecystectomy without fluid collection in the gallbladder fossa, common bile duct diameter 2 mm, normal parenchymal echogenicity Lab data: Sodium 137, potassium 3.1, BUN less than 5, creatinine 0.65, glucose 113, albumin 3.4, total protein 5.7, white count 7900 differential not obtained, hemoglobin 12.1, platelets 249,000, urinalysis unremarkable except  for a few bacteria, trace hemoglobin, 15 ketones, 6-30 squamous epithelials, 0-5 wbc's Medications and treatments: Zofran 4 mg IV 1, Toradol 30 mg IV 1, GI cocktail 30 mL 1, Protonix 40 mg by mouth 1, normal saline bolus 1 L  Review of Systems:  In addition to the HPI above,  No Fever, myalgias or other constitutional symptoms No Headache, changes with Vision or hearing, new weakness, tingling, numbness in any extremity, No problems swallowing food or Liquids, indigestion/reflux No Chest pain, Cough or Shortness of Breath, palpitations, orthopnea or DOE-reports subtle swelling left foot No masses, melena or hematochezia, no dark tarry stools No dysuria, hematuria or flank pain No new skin rashes, lesions, masses or bruises, No new joints pains-aches No recent weight gain or loss No polyuria, polydypsia or polyphagia,   Past Medical History  Diagnosis Date  . Cervical dysplasia 2007    low-grade SIL, normal Pap smears since then  . IUD     inserted 03/2002.04/06/2007, 06/03/2012  . Cancer ([place])     Melanoma  . Osteomalacia   . Seafood allergy   . Intermittent palpitations   . Ocular migraine   . Allergic rhinitis     Past Surgical History  Procedure Laterality Date  . Foot surgery      toenail  . Removal of melanoma    . Intrauterine device insertion  06/03/2012    Mirena  . Cholecystectomy N/A 04/09/2016    Procedure: LAPAROSCOPIC CHOLECYSTECTOMY ;  Surgeon: Rolm Bookbinder, MD;  Location: [facility] OR;  Service: General;  Laterality: N/A;    Social History   Social History  . Marital Status: Married    Spouse Name: N/A  . Number of Children: N/A  . Years of Education: N/A   Occupational History  . Not on file.   Social History Main  Topics  . Smoking status: Former Research scientist (life sciences)  . Smokeless tobacco: Not on file  . Alcohol Use: 0.0 oz/week    0 Standard drinks or equivalent per week     Comment: Rare  . Drug Use: No  . Sexual Activity: Yes    Birth Control/  Protection: IUD     Comment: Mirena 06/03/2012, declined insurance questions   Other Topics Concern  . Not on file   Social History Narrative    Mobility: Without assistive devices Work history: Not obtained   Allergies  Allergen Reactions  . Iodine Shortness Of Breath and Swelling  . Other Hives and Swelling    Seafood  . Ampicillin Other (See Comments)    unknown  . Levofloxacin Hives  . Septra [Bactrim] Hives    Family History  Problem Relation Age of Onset  . Breast cancer Sister 10  . Arthritis Sister     Psoriatic  . Thyroid disease Sister   . Stroke Father   . Autoimmune disease Father   . CVA Father   . Breast cancer Maternal Grandmother     Age 52  . Tremor Mother   . Thyroid disease Daughter     Hypo     Prior to Admission medications   Medication Sig Start Date End Date Taking? Authorizing Provider  HYDROcodone-acetaminophen (LORTAB 10) 10-500 MG tablet Take 1 tablet by mouth every 6 (six) hours as needed for pain. Patient not taking: Reported on 04/12/2016 04/11/16   Reyne Dumas, MD    Physical Exam: Filed Vitals:   04/12/16 0545 04/12/16 0600 04/12/16 0615 04/12/16 0630  BP: 95/57 102/63 95/59 97/55   Pulse: 58 63 57 61  Temp:      TempSrc:      Resp: 15 14 13 13   SpO2: 97% 98% 97% 96%      Constitutional: NAD, calm, comfortable Eyes: PERRL, lids and conjunctivae normal ENMT: Mucous membranes are moist. Posterior pharynx clear of any exudate or lesions.Normal dentition.  Neck: normal, supple, no masses, no thyromegaly Respiratory: clear to auscultation bilaterally, no wheezing, no crackles. Normal respiratory effort. No accessory muscle use.  Cardiovascular: Regular rate and rhythm, no murmurs / rubs / gallops. No extremity edema. 2+ pedal pulses. No carotid bruits.  Abdomen: Minimally tender over epigastrium and right upper quadrant, no masses palpated. JP drain almost half full with dark bilious returns-patient reports has been emptied  once since arriving to the ER, No hepatosplenomegaly. Bowel sounds positive.  Musculoskeletal: no clubbing / cyanosis. No joint deformity upper and lower extremities. Good ROM, no contractures. Normal muscle tone.  Skin: no rashes, lesions, ulcers. No induration Neurologic: CN 2-12 grossly intact. Sensation intact, DTR normal. Strength 5/5 x all 4 extremities.  Psychiatric: Normal judgment and insight. Alert and oriented x 3. Normal mood.    Labs on Admission: I have personally reviewed following labs and imaging studies  CBC:  Recent Labs Lab 04/07/16 0331 04/08/16 0521 04/09/16 0505 04/10/16 0555 04/11/16 2241  WBC 9.4 5.6 6.2 9.0 7.9  HGB 12.1 11.5* 11.3* 11.6* 12.1  HCT 37.2 36.8 35.5* 35.3* 36.9  MCV 89.9 91.3 90.8 89.4 88.5  PLT 215 167 174 206 0000000   Basic Metabolic Panel:  Recent Labs Lab 04/07/16 0331 04/08/16 0521 04/09/16 0505 04/10/16 0555 04/11/16 2241  NA 142 139 140 137 137  K 3.4* 3.6 3.9 4.0 3.1*  CL 111 114* 112* 106 108  CO2 24 22 22 23 24   GLUCOSE 108* 80 75 104* 113*  BUN 10 7 <5* <5* <5*  CREATININE 0.58 0.58 0.62 0.54 0.65  CALCIUM 8.1* 7.8* 8.0* 8.3* 8.8*  MG 2.0  --   --   --   --   PHOS 3.4  --   --   --   --    GFR: Estimated Creatinine Clearance: 77.9 mL/min (by C-G formula based on Cr of 0.65). Liver Function Tests:  Recent Labs Lab 04/07/16 0331 04/08/16 0521 04/09/16 0505 04/10/16 0555 04/11/16 2241  AST 427* 98* 42* 27 19  ALT 230* 174* 130* 101* 69*  ALKPHOS 58 84 84 84 76  BILITOT 1.4* 1.7* 1.1 1.2 0.5  PROT 5.3* 4.8* 5.0* 5.2* 5.7*  ALBUMIN 3.3* 2.9* 2.9* 3.1* 3.4*    Recent Labs Lab 04/06/16 2148 04/07/16 0331 04/08/16 0521 04/09/16 0505 04/11/16 2241  LIPASE 1341* 5793* 363* 65* 33   No results for input(s): AMMONIA in the last 168 hours. Coagulation Profile: No results for input(s): INR, PROTIME in the last 168 hours. Cardiac Enzymes:  Recent Labs Lab 04/06/16 2148 04/07/16 0331 04/07/16 1030  04/07/16 1511 04/12/16 0458  TROPONINI <0.03 <0.03 <0.03 <0.03 <0.03   BNP (last 3 results) No results for input(s): PROBNP in the last 8760 hours. HbA1C: No results for input(s): HGBA1C in the last 72 hours. CBG: No results for input(s): GLUCAP in the last 168 hours. Lipid Profile: No results for input(s): CHOL, HDL, LDLCALC, TRIG, CHOLHDL, LDLDIRECT in the last 72 hours. Thyroid Function Tests:  Recent Labs  04/10/16 1104  TSH 0.457  FREET4 0.97   Anemia Panel: No results for input(s): VITAMINB12, FOLATE, FERRITIN, TIBC, IRON, RETICCTPCT in the last 72 hours. Urine analysis:    Component Value Date/Time   COLORURINE YELLOW 04/11/2016 2307   APPEARANCEUR CLEAR 04/11/2016 2307   LABSPEC 1.013 04/11/2016 2307   PHURINE 5.5 04/11/2016 2307   GLUCOSEU NEGATIVE 04/11/2016 2307   HGBUR TRACE* 04/11/2016 2307   BILIRUBINUR NEGATIVE 04/11/2016 2307   KETONESUR 15* 04/11/2016 2307   PROTEINUR NEGATIVE 04/11/2016 2307   UROBILINOGEN 0.2 03/30/2015 0852   NITRITE NEGATIVE 04/11/2016 2307   LEUKOCYTESUR NEGATIVE 04/11/2016 2307   Sepsis Labs: @LABRCNTIP (procalcitonin:4,lacticidven:4) )No results found for this or any previous visit (from the past 240 hour(s)).   Radiological Exams on Admission: Ct Abdomen Pelvis Wo Contrast  04/12/2016  CLINICAL DATA:  Right-sided abdominal pain.  Recent cholecystectomy. EXAM: CT ABDOMEN AND PELVIS WITHOUT CONTRAST TECHNIQUE: Multidetector CT imaging of the abdomen and pelvis was performed following the standard protocol without IV contrast. COMPARISON:  04/08/2016 MRI FINDINGS: Lower chest and abdominal wall: Changes of recent laparoscopic surgery. No signs of abscess. Trace pleural effusions, greater on the right, with minimal atelectasis. Hepatobiliary: Recent cholecystectomy with expected fluid and stranding in the operative bed. There is a surgical drain in the subhepatic space. No concerning ascites. No visible choledocholithiasis. No biliary  distention. Pancreas: Unremarkable. Spleen: Unremarkable. Adrenals/Urinary Tract: Negative adrenals. No hydronephrosis or stone. Unremarkable bladder. Stomach/Bowel:  No obstruction. No appendicitis. Reproductive:IUD in place. Vascular/Lymphatic: No acute vascular abnormality. No mass or adenopathy. Other: Trace pelvic fluid. No perihepatic ascites. Small volume residual pneumoperitoneum. Musculoskeletal: Expansile sacral Tarlov cysts on the left at S2, incidental in this setting. IMPRESSION: 1. No unexpected finding after recent cholecystectomy. 2. Trace pleural effusions and mild basilar atelectasis. Electronically Signed   By: Monte Fantasia M.D.   On: 04/12/2016 04:00   US Abdomen Limited Ruq  04/12/2016  CLINICAL DATA:  Postoperative pain. Diagnosed with cholelithiasis and pancreatitis 4  days ago, now with severe abdominal pain. EXAM: US ABDOMEN LIMITED - RIGHT UPPER QUADRANT COMPARISON:  CT abdomen and pelvis April 12, 2016 at 0345 hours FINDINGS: Gallbladder: Status post cholecystectomy without fluid collection in the gallbladder fossa. Common bile duct: Diameter: 2 mm Liver: No focal lesion identified. Within normal limits in parenchymal echogenicity. Hepatopetal portal vein. Small RIGHT pleural effusion as seen on today CT. IMPRESSION: Status post cholecystectomy.  No acute RIGHT upper quadrant process. Electronically Signed   By: Elon Alas M.D.   On: 04/12/2016 04:53    EKG: (Independently reviewed) sinus rhythm with bradycardic rate of 53 bpm, QTC 402 ms, no ischemic changes and unchanged from previous EKG  Assessment/Plan Principal Problem:   Postoperative bile leak/ S/P laparoscopic cholecystectomy -Patient presents with acute abdominal pain similar to her preoperative pain that began after arriving home associated with eating with concurrent change in volume and appearance of JP effluent consistent with likely bile leak -During operative procedure it was documented patient clearly  had signs consistent with acute cholecystitis with significant inflammation of the biliary triangle-it was also documented that the cystic duct was diminutive and friable  -Evaluated by surgeon- HIDA scan pending -GI consultation pending -If bile leak confirmed likely will require placement of stent -No fever or leukocytosis therefore no indication for empiric antibiotics at this juncture and no need to culture JP effluent -NPO with IV fluids -IV Toradol prn -IV Zofran prn -Repeat labs in a.m.  Active Problems:   History of Gallstone pancreatitis -Lipase today is normal at 33; LFTs are also within normal limits -CT scan as well as right upper quadrant ultrasound confirmed normal common bile duct size 2 mm therefore no indication to suspect retained stone as etiology to patient's abdominal pain (IOC was not accomplished during operative procedure-see surgeon's note) -Continue to follow labs and symptoms -Formal GI consultation pending    Acute hypokalemia -Utilize potassium containing maintenance IV fluids -Check magnesium and phosphorus -Follow labs -When allowed POs if potassium remains low prefer oral repletion    Paroxysmal SVT (supraventricular tachycardia)  -History of same previous admission currently maintaining sinus rhythm is bradycardic rates -TSH normal last admission -Try to keep potassium greater than or equal to 4.0    Dehydration, mild -Received normal saline bolus 1 L in the ER -Continue IV fluids at 75 mL per hour until tolerating diet      DVT prophylaxis: SCDs until cleared by GI and surgery to begin pharmacological DVT prophylaxis Code Status: Full Code Family Communication: No family at bedside at time of admission Disposition Plan: Anticipate discharge back to preadmission however, once medically stable Consults called: Surgery/[place]; Gastroenterology/[facility] on call physician Admission status: Observation/surgical floor     Raydel Hosick L.  ANP-BC [facility] Pager [phone]   If 7PM-7AM, please contact night-coverage [email or web address] Password [facility]  04/12/2016, 7:46 AM

## 2016-04-12 NOTE — Progress Notes (Signed)
Pt admitted to 6N15 via stretcher from ED.  Pt AAO X 4.  Pt on RA. Pt has 22G to Rt FA SL.  Pt has abd lap sites X 2 with steri-strips.  Pt has JP to RLQ with dry dressing draining dark brown drainage.  Pt c/o headache.  Will check pt's orders to see what she can get for headache.  Pt has belongings to bedside.  Will continue to monitor.

## 2016-04-12 NOTE — Progress Notes (Signed)
Patient ID: Laurie Terry, female   DOB: 02-13-1966, 50 y.o.   MRN: [id number] Sphincterotomy done with ercp, no stent, discussed with Dr Watt Climes, will monitor drain output and hopefully wont need another procedure.  Has cbc pending right now

## 2016-04-12 NOTE — Anesthesia Postprocedure Evaluation (Signed)
Anesthesia Post Note  Patient: Laurie Terry  Procedure(s) Performed: Procedure(s) (LRB): ENDOSCOPIC RETROGRADE CHOLANGIOPANCREATOGRAPHY (ERCP) (N/A)  Patient location during evaluation: Endoscopy Anesthesia Type: General Level of consciousness: awake and alert, oriented and patient cooperative Pain management: pain level controlled Vital Signs Assessment: post-procedure vital signs reviewed and stable Respiratory status: spontaneous breathing, nonlabored ventilation and respiratory function stable Cardiovascular status: blood pressure returned to baseline and stable Postop Assessment: no signs of nausea or vomiting Anesthetic complications: no    Last Vitals:  Filed Vitals:   04/12/16 1330 04/12/16 1340  BP: 112/64 114/59  Pulse: 53 56  Temp:    Resp: 11 9    Last Pain:  Filed Vitals:   04/12/16 1343  PainSc: 4                  Khristie Sak,E. Roxy Mastandrea

## 2016-04-12 NOTE — Progress Notes (Signed)
PROCEDURE NOTE  04/12/2016  12:37 PM  PATIENT:  Laurie Terry  50 y.o. female  PRE-OPERATIVE DIAGNOSIS:  Bile leak  POST-OPERATIVE DIAGNOSIS: Same  PROCEDURE: ERCP with pancreatic stent and failed CBD stent after needle knife and conventional sphincterotomy  SURGEON:  Surgeon(s): Clarene Essex, MD  ASSESSMENT/FINDINGS:  The patient had a normal ampulla but tiny ostia and unfortunately in trying to cannulate multiple times the wire went towards the pancreas a few times and we elected to place a [place] 3 cm single pigtail stent into the pancreas but we were unable to cannulate with both the regular sphincterotome and the smaller sphincterotome and we proceeded with a needle knife sphincterotomy in the customary fashion until we saw a little bit of bile and we were able to switch to the smaller sphincterotome and obtained deep selective cannulation which was confirmed by minimal injection initially the wire was advanced towards the cystic duct but we were able to reposition the sphincterotome and we enlarged the sphincterotomy a little and removed the sphincterotome in an effort to try to place 8.5 cm because she had a small duct but were unsuccessful in advancing up the bile duct and unfortunately in our attempts we lost access so the stent and introducer was removed and when we readvanced the sphincterotome and tried to recannulate we went into a false channel we were unable to advance the sphincterotome or the wire and we did inject a little bit of dye to confirm we were not in the duct and in removing the sphincterotome and the wire we stirred up a little bit of bleeding which did stop on its own with washing but after our prolonged effort and creating a false channel and proceeding with a sphincterotomy we elected to stop the procedure at this point and the patient tolerated the procedure well there was some minimal bleeding as above and a false channel was created but no other obvious  complication PLAN OF CARE: Customary post-ERCP observation and watch her drain output for any signs of decrease and if doing well and still needs a stent early next week would be happy to proceed then and will last my partner Dr. Paulita Fujita to proceed if needed urgently this weekend otherwise could proceed with a HIDA scan if a question of ongoing leaking and would cover with antibiotics if signs of infection but be aware of her allergies which is why we did not premedicate her not wanting to use clindamycin or gentamicin but possibly you could call ID for their assistance or pharmacology and will repeat a CBC later today just to be sure and we will allow sips of clear liquids in a few hours if doing okay

## 2016-04-12 NOTE — ED Notes (Signed)
Pt having concern about her pain, she states she is having exactly same pain as before she had surgery, pt don't believe it has nothing to be with her surgery, MD to be notified.

## 2016-04-12 NOTE — Anesthesia Preprocedure Evaluation (Addendum)
Anesthesia Evaluation  Patient identified by MRN, date of birth, ID band Patient awake    Reviewed: Allergy & Precautions, NPO status , Patient's Chart, lab work & pertinent test results  History of Anesthesia Complications Negative for: history of anesthetic complications  Airway Mallampati: III  TM Distance: >3 FB Neck ROM: Full    Dental  (+) Dental Advisory Given   Pulmonary neg pulmonary ROS, former smoker,    breath sounds clear to auscultation       Cardiovascular + dysrhythmias (not documented, no symptoms other than can feel palpitations) Supra Ventricular Tachycardia  Rhythm:Regular Rate:Normal  04/11/16 ECHO: EF 55-60%, valves OK   Neuro/Psych    GI/Hepatic Neg liver ROS, H/o pancreatitis   Endo/Other  negative endocrine ROS  Renal/GU negative Renal ROS     Musculoskeletal   Abdominal   Peds  Hematology negative hematology ROS (+)   Anesthesia Other Findings melanoma  Reproductive/Obstetrics IUD                           Anesthesia Physical Anesthesia Plan  ASA: II  Anesthesia Plan: General   Post-op Pain Management:    Induction: Intravenous  Airway Management Planned: Oral ETT and Video Laryngoscope Planned  Additional Equipment:   Intra-op Plan:   Post-operative Plan: Extubation in OR  Informed Consent: I have reviewed the patients History and Physical, chart, labs and discussed the procedure including the risks, benefits and alternatives for the proposed anesthesia with the patient or authorized representative who has indicated his/her understanding and acceptance.   Dental advisory given  Plan Discussed with: CRNA and Surgeon  Anesthesia Plan Comments: (Plan routine monitors, GETA )        Anesthesia Quick Evaluation

## 2016-04-12 NOTE — Progress Notes (Signed)
Received signout from Dr. Leonides Schanz [name] is a 50 year old female who is was hospitalized 7/15- 7/21 for gallstone pancreatitis. She underwent laparoscopic cholecystectomy on 7/18 by Dr. Donne Hazel. Patient had been discharged home yesterday, but still noted some pain reluctant to take pain medication due to multiple allergies. Last night had acute onset of sharp pain in her abdomen. Initial workup including lipase, LFTs, and ultrasound in no acute cause for patient's symptoms. General surgery and GI consulted and will see the patient. Admit for observation to a MedSurg bed for IV fluids and bowel rest.

## 2016-04-12 NOTE — ED Provider Notes (Signed)
TIME SEEN: 2:04 AM  CHIEF COMPLAINT: Abdominal Pain  HPI: Laurie Terry is a 50 y.o. female who presents to the Emergency Department complaining of sudden onset, constant, severe, abdominal pain beginning last night. Pt reports that she was recently in the hospital for several days for a cholecystectomy and gallstone pancreatitis; admitted 7/16, laparoscopic cholecystectomy on 7/18 by Dr. Donne Hazel, discharged yesterday 7/20. Pt notes intermittent abdominal pain s/p eating throughout the day yesterday, but states the pain became constant and severe s/p eating late last night. She states when her abdominal pain presents it feels like she cannot "catch her breath." She also reports intermittent nausea, chills over the last couple of days. Pt reports that she has a JP drain in place and states her output has been darker tonight. She notes an irregular heartbeat tonight; she states she had an irregular heartbeat several days ago while in the hospital and was told it was due to low potassium. Pt states her last bowel movement was prior to her cholecystectomy. She states she has been passing gas normally. She states that she has been taking tylenol and motrin since her cholecystectomy and notes that she has allergies to various pain medications. Pt also reports that she is allergic to iodine and contrast media. She denies h/o PE or DVT. States she did have some swelling in her left foot today which has resolved. No current calf tenderness. She further denies chest pain, vomiting, diarrhea, bloody stool, melena, SOB, or any other associated symptoms.  PCP - [facility]   ROS: See HPI Constitutional: chills, no fever  Eyes: no drainage  ENT: no runny nose   Cardiovascular:  no chest pain  Resp: no SOB GI: abdominal pain, nausea, no vomiting GU: no dysuria Integumentary: no rash  Allergy: no hives  Musculoskeletal: no leg swelling  Neurological: tremors, no slurred speech ROS otherwise  negative  PAST MEDICAL HISTORY/PAST SURGICAL HISTORY:  Past Medical History  Diagnosis Date  . Cervical dysplasia 2007    low-grade SIL, normal Pap smears since then  . IUD     inserted 03/2002.04/06/2007, 06/03/2012  . Cancer ([place])     Melanoma  . Osteomalacia   . Seafood allergy   . Intermittent palpitations   . Ocular migraine   . Allergic rhinitis     MEDICATIONS:  Prior to Admission medications   Medication Sig Start Date End Date Taking? Authorizing Provider  HYDROcodone-acetaminophen (LORTAB 10) 10-500 MG tablet Take 1 tablet by mouth every 6 (six) hours as needed for pain. Patient not taking: Reported on 04/12/2016 04/11/16   Reyne Dumas, MD    ALLERGIES:  Allergies  Allergen Reactions  . Iodine Shortness Of Breath and Swelling  . Other Hives and Swelling    Seafood  . Ampicillin Other (See Comments)    unknown  . Levofloxacin Hives  . Septra [Bactrim] Hives    SOCIAL HISTORY:  Social History  Substance Use Topics  . Smoking status: Former Research scientist (life sciences)  . Smokeless tobacco: Not on file  . Alcohol Use: 0.0 oz/week    0 Standard drinks or equivalent per week     Comment: Rare    FAMILY HISTORY: Family History  Problem Relation Age of Onset  . Breast cancer Sister 71  . Arthritis Sister     Psoriatic  . Thyroid disease Sister   . Stroke Father   . Autoimmune disease Father   . CVA Father   . Breast cancer Maternal Grandmother  Age 20  . Tremor Mother   . Thyroid disease Daughter     Hypo    EXAM: BP 116/72 mmHg  Pulse 50  Temp(Src) 98.6 F (37 C) (Oral)  Resp 16  SpO2 99% CONSTITUTIONAL: Alert and oriented and responds appropriately to questions. Well-appearing; well-nourished HEAD: Normocephalic EYES: Conjunctivae clear, PERRL ENT: normal nose; no rhinorrhea; moist mucous membranes NECK: Supple, no meningismus, no LAD  CARD: RRR; S1 and S2 appreciated; no murmurs, no clicks, no rubs, no gallops RESP: Normal chest excursion without splinting  or tachypnea; breath sounds clear and equal bilaterally; no wheezes, no rhonchi, no rales, no hypoxia or respiratory distress, speaking full sentences ABD/GI: Normal bowel sounds; non-distended; soft, tender to palpation throughout epigastric region, right upper and right mid abdomen; voluntary guarding, no rebound, no peritoneal signs, JP drain in right-mid abdomen with serosanguineous drainage in the bulb, no purulent drainage BACK:  The back appears normal and is non-tender to palpation, there is no CVA tenderness EXT: Normal ROM in all joints; non-tender to palpation; no edema; normal capillary refill; no cyanosis, no calf tenderness or swelling    SKIN: Normal color for age and race; warm; no rash NEURO: Moves all extremities equally, sensation to light touch intact diffusely, cranial nerves II through XII intact PSYCH: The patient's mood and manner are appropriate. Grooming and personal hygiene are appropriate.  MEDICAL DECISION MAKING: Patient here with upper abdominal pain worse after eating after a left scalp cholecystectomy and gallstone pancreatitis. Called her gastroenterologist on call Dr. Michail Sermon who instructed her to come to the emergency department. Patient is tender throughout her right abdomen. Seems to be more tender than I would expect postoperatively but she is only taking Tylenol and Motrin at home. States she is worried about taking narcotics for pain. She is requesting Toradol in the emergency department. We'll also give Zofran. Labs show no leukocytosis. Normal lipase. Her ALT is still mildly elevated but this has improved. We'll start with a CT scan of her abdomen and pelvis. She states she thinks she has an allergy with an anaphylactoid reaction to both oral and IV contrast. This will need to be a noncontrast CT scan.  ED PROGRESS: 4:05 AM Patient's CT scan shows no unexpected findings after recent cholecystectomy. There is expected fluid and stranding in operative bed and a  surgical drain in the subhepatic space. No ascites. No visible choledocholithiasis or biliary distention. Pancreas appears unremarkable. No signs of appendicitis. Given she is still having significant pain, will obtain the right upper quadrant ultrasound which may be more sensitive for evaluating for choledocholithiasis, biliary distention.  Patient reports her pain had improved after Toradol but started again with movement during CT scan. She reports she is concerned because her pain seems to be more epigastric. Will add on EKG, troponin. We'll give Protonix, GI cocktail and reassess. No history of previous endoscopy, peptic ulcer. No sign of perforation on CT scan.  6:00 AM  Pt's ultrasound confirms no choledocholithiasis or biliary dilatation. Common bile duct is 2 mm.  EKG shows no ischemic abnormality.  Troponin is negative. Patient still having pain and reports she is "incapacitated" by it. She still refuses to take any narcotic pain medication. She does not for comfortable at this time going home. Discussed with her that I would talk to GI on call and possibly the hospitalist. She states she feels that this is still her pancreatitis and thinks she may need admission for bowel rest, IV hydration.  6:15 AM  D/w Dr. Michail Sermon with gastroenterology. He feels it would be reasonable to admit patient for bowel rest and continued hydration and pain control. He recommends medicine admission. Also recommends that I consult general surgery as she was scheduled to have her drain removed today. GI will see patient in consult. Appreciate their help.   6:30 AM  D/w Dr. Hulen Skains with surgery. Surgery team will see the patient in consult today non emergently.   6:35 AM  Discussed patient's case with hospitalist, Dr. Tamala Julian.  Recommend admission to medical, observation bed.  I will place holding orders per their request. Patient and family (if present) updated with plan. Care transferred to hospitalist service.  Patient  will be seen by oncoming daytime hospitalist team which I feels appropriate as she is stable.  I reviewed all nursing notes, vitals, pertinent old records, EKGs, labs, imaging (as available).   EKG Interpretation  Date/Time:  Friday April 12 2016 04:55:09 EDT Ventricular Rate:  53 PR Interval:    QRS Duration: 96 QT Interval:  428 QTC Calculation: 402 R Axis:   74 Text Interpretation:  Sinus rhythm Low voltage, precordial leads No significant change since last tracing Confirmed by Mariena Meares,  DO, Broly Hatfield [id number]) on 04/12/2016 5:01:20 AM        I personally performed the services described in this documentation, which was scribed in my presence. The recorded information has been reviewed and is accurate.   [place], DO 04/12/16 [phone]

## 2016-04-12 NOTE — Transfer of Care (Signed)
Immediate Anesthesia Transfer of Care Note  Patient: Laurie Terry  Procedure(s) Performed: Procedure(s): ENDOSCOPIC RETROGRADE CHOLANGIOPANCREATOGRAPHY (ERCP) (N/A)  Patient Location: Endoscopy Unit  Anesthesia Type:General  Level of Consciousness: awake and patient cooperative  Airway & Oxygen Therapy: Patient Spontanous Breathing  Post-op Assessment: Report given to RN and Post -op Vital signs reviewed and stable  Post vital signs: Reviewed and stable  Last Vitals:  Filed Vitals:   04/12/16 0800 04/12/16 0938  BP: 108/56 109/59  Pulse: 55 63  Temp: 36.8 C   Resp: 14 11    Last Pain:  Filed Vitals:   04/12/16 0950  PainSc: 4       Patients Stated Pain Goal: 4 ([id number])  Complications: No apparent anesthesia complications

## 2016-04-12 NOTE — Anesthesia Procedure Notes (Signed)
Procedure Name: Intubation Date/Time: 04/12/2016 10:48 AM Performed by: Lance Coon Pre-anesthesia Checklist: Patient identified, Emergency Drugs available, Suction available, Timeout performed and Patient being monitored Patient Re-evaluated:Patient Re-evaluated prior to inductionOxygen Delivery Method: Circle system utilized Preoxygenation: Pre-oxygenation with 100% oxygen Intubation Type: IV induction Ventilation: Mask ventilation without difficulty Laryngoscope Size: McGraph and 3 Grade View: Grade I Tube type: Oral Tube size: 7.0 mm Number of attempts: 1 Airway Equipment and Method: Stylet and LTA kit utilized Placement Confirmation: ETT inserted through vocal cords under direct vision,  positive ETCO2 and breath sounds checked- equal and bilateral Secured at: 22 cm Tube secured with: Tape Dental Injury: Teeth and Oropharynx as per pre-operative assessment

## 2016-04-12 NOTE — Progress Notes (Signed)
Patient ID: Laurie Terry, female   DOB: [birth year], 50 y.o.   MRN: [id number] 50 yof s/p lap chole for gsp with very inflamed gb who was sent home yesterday. I left a drain and this was serous yesterday. Overnight she had new pain and drain character changed.  lfts are better, lipase nl, ruq [place] without fluid, drain is bilious in nature however.  Discussed with patient likely bile leak. Will get hida scan today and may very well need ercp to stent if leak if present as suspected

## 2016-04-12 NOTE — ED Notes (Signed)
Patient transported to Ultrasound 

## 2016-04-13 ENCOUNTER — Encounter (HOSPITAL_COMMUNITY): Payer: Self-pay | Admitting: Gastroenterology

## 2016-04-13 DIAGNOSIS — D649 Anemia, unspecified: Secondary | ICD-10-CM | POA: Diagnosis not present

## 2016-04-13 DIAGNOSIS — Z85820 Personal history of malignant melanoma of skin: Secondary | ICD-10-CM | POA: Diagnosis not present

## 2016-04-13 DIAGNOSIS — Z539 Procedure and treatment not carried out, unspecified reason: Secondary | ICD-10-CM | POA: Diagnosis present

## 2016-04-13 DIAGNOSIS — Z87410 Personal history of cervical dysplasia: Secondary | ICD-10-CM | POA: Diagnosis not present

## 2016-04-13 DIAGNOSIS — R109 Unspecified abdominal pain: Secondary | ICD-10-CM | POA: Diagnosis present

## 2016-04-13 DIAGNOSIS — K838 Other specified diseases of biliary tract: Secondary | ICD-10-CM | POA: Diagnosis not present

## 2016-04-13 DIAGNOSIS — E876 Hypokalemia: Secondary | ICD-10-CM | POA: Diagnosis not present

## 2016-04-13 DIAGNOSIS — K851 Biliary acute pancreatitis without necrosis or infection: Secondary | ICD-10-CM | POA: Diagnosis not present

## 2016-04-13 DIAGNOSIS — K929 Disease of digestive system, unspecified: Secondary | ICD-10-CM | POA: Diagnosis not present

## 2016-04-13 DIAGNOSIS — K5900 Constipation, unspecified: Secondary | ICD-10-CM | POA: Diagnosis present

## 2016-04-13 DIAGNOSIS — E860 Dehydration: Secondary | ICD-10-CM | POA: Diagnosis present

## 2016-04-13 DIAGNOSIS — I471 Supraventricular tachycardia: Secondary | ICD-10-CM | POA: Diagnosis not present

## 2016-04-13 DIAGNOSIS — K9189 Other postprocedural complications and disorders of digestive system: Secondary | ICD-10-CM | POA: Diagnosis present

## 2016-04-13 DIAGNOSIS — Z87891 Personal history of nicotine dependence: Secondary | ICD-10-CM | POA: Diagnosis not present

## 2016-04-13 DIAGNOSIS — Y832 Surgical operation with anastomosis, bypass or graft as the cause of abnormal reaction of the patient, or of later complication, without mention of misadventure at the time of the procedure: Secondary | ICD-10-CM | POA: Diagnosis present

## 2016-04-13 LAB — MRSA PCR SCREENING: MRSA BY PCR: NEGATIVE

## 2016-04-13 LAB — URINALYSIS, ROUTINE W REFLEX MICROSCOPIC
Bilirubin Urine: NEGATIVE
Glucose, UA: NEGATIVE mg/dL
Hgb urine dipstick: NEGATIVE
KETONES UR: 15 mg/dL — AB
LEUKOCYTES UA: NEGATIVE
NITRITE: NEGATIVE
PROTEIN: NEGATIVE mg/dL
Specific Gravity, Urine: 1.01 (ref 1.005–1.030)
pH: 7.5 (ref 5.0–8.0)

## 2016-04-13 LAB — CBC
HCT: 33.5 % — ABNORMAL LOW (ref 36.0–46.0)
HEMOGLOBIN: 11.1 g/dL — AB (ref 12.0–15.0)
MCH: 29.5 pg (ref 26.0–34.0)
MCHC: 33.1 g/dL (ref 30.0–36.0)
MCV: 89.1 fL (ref 78.0–100.0)
PLATELETS: 224 10*3/uL (ref 150–400)
RBC: 3.76 MIL/uL — AB (ref 3.87–5.11)
RDW: 14 % (ref 11.5–15.5)
WBC: 9.2 10*3/uL (ref 4.0–10.5)

## 2016-04-13 LAB — COMPREHENSIVE METABOLIC PANEL
ALK PHOS: 63 U/L (ref 38–126)
ALT: 57 U/L — ABNORMAL HIGH (ref 14–54)
ANION GAP: 6 (ref 5–15)
AST: 36 U/L (ref 15–41)
Albumin: 2.8 g/dL — ABNORMAL LOW (ref 3.5–5.0)
BUN: <5 mg/dL — ABNORMAL LOW (ref 6–20)
CALCIUM: 8.2 mg/dL — AB (ref 8.9–10.3)
CO2: 27 mmol/L (ref 22–32)
CREATININE: 0.63 mg/dL (ref 0.44–1.00)
Chloride: 109 mmol/L (ref 101–111)
Glucose, Bld: 97 mg/dL (ref 65–99)
Potassium: 3.1 mmol/L — ABNORMAL LOW (ref 3.5–5.1)
SODIUM: 142 mmol/L (ref 135–145)
TOTAL PROTEIN: 4.9 g/dL — AB (ref 6.5–8.1)
Total Bilirubin: 0.7 mg/dL (ref 0.3–1.2)

## 2016-04-13 LAB — LIPASE, BLOOD: LIPASE: 45 U/L (ref 11–51)

## 2016-04-13 MED ORDER — POTASSIUM CHLORIDE 10 MEQ/100ML IV SOLN
10.0000 meq | INTRAVENOUS | Status: AC
  Administered 2016-04-13 (×4): 10 meq via INTRAVENOUS
  Filled 2016-04-13 (×4): qty 100

## 2016-04-13 MED ORDER — SODIUM CHLORIDE 0.9 % IV SOLN
500.0000 mg | Freq: Four times a day (QID) | INTRAVENOUS | Status: DC
  Administered 2016-04-13 – 2016-04-17 (×16): 500 mg via INTRAVENOUS
  Filled 2016-04-13 (×19): qty 500

## 2016-04-13 MED ORDER — SIMETHICONE 80 MG PO CHEW
80.0000 mg | CHEWABLE_TABLET | Freq: Four times a day (QID) | ORAL | Status: DC | PRN
  Administered 2016-04-13 – 2016-04-14 (×5): 80 mg via ORAL
  Filled 2016-04-13 (×5): qty 1

## 2016-04-13 MED ORDER — ENOXAPARIN SODIUM 40 MG/0.4ML ~~LOC~~ SOLN
40.0000 mg | SUBCUTANEOUS | Status: DC
  Administered 2016-04-13 – 2016-04-15 (×3): 40 mg via SUBCUTANEOUS
  Filled 2016-04-13 (×4): qty 0.4

## 2016-04-13 MED ORDER — TRAMADOL HCL 50 MG PO TABS
100.0000 mg | ORAL_TABLET | Freq: Two times a day (BID) | ORAL | Status: DC | PRN
  Administered 2016-04-13 – 2016-04-17 (×9): 100 mg via ORAL
  Filled 2016-04-13 (×8): qty 2

## 2016-04-13 NOTE — Progress Notes (Signed)
[facility]:  [phone] General Surgery Progress Note    POD -  1 Day Post-Op  Assessment/Plan: 1.  Lap chole - 04/09/2016 Donne Hazel  For gall stone pancreatitis  Has JP in place - 505 cc recorded yesterday  Still with RUQ pain  On clear liquids - but not ready to advance  2.  ENDOSCOPIC RETROGRADE CHOLANGIOPANCREATOGRAPHY (ERCP) - 04/12/2016 - [facility]  Did sphincterotomy, but could not place stent  3.  Hypokalemic  K+ - 3.1 - 04/13/2016  Getting runs of K+  (she could take po K+) 4.  DVT prophylaxis - to start lovenox   Principal Problem:   Postoperative bile leak Active Problems:   History of Gallstone pancreatitis   S/P laparoscopic cholecystectomy   Acute hypokalemia   Dehydration, mild   Paroxysmal SVT (supraventricular tachycardia) (HCC)   Bile leak, postoperative  Subjective:  Sore RUQ.  Does not have much of an appetite.  She said she felt better before she started having procedures.  Mother, [name] Patient, and son, Merrilee Seashore, in room.  Objective:   Filed Vitals:   04/12/16 2212 04/13/16 0537  BP: 99/60 101/59  Pulse: 69 72  Temp: 98.7 F (37.1 C) 98.6 F (37 C)  Resp: 12 12     Intake/Output from previous day:  07/21 0701 - 07/22 0700 In: 2171.3 [P.O.:240; I.V.:1881.3; IV Piggyback:50] Out: 2825 [Urine:2300; Drains:505; Blood:20]  Intake/Output this shift:      Physical Exam:   General: WN WF who is alert and oriented.    HEENT: Normal. Pupils equal. .   Lungs: Clear.   Abdomen: Sore upper abdomen.  Decreased BS.  No peritoneal signs.   Wound: Drain in RUQ - 505 cc recorded last 24 hours.   Lab Results:    Recent Labs  04/12/16 1429 04/13/16 0326  WBC 8.0 9.2  HGB 12.3 11.1*  HCT 37.8 33.5*  PLT 228 224    BMET   Recent Labs  04/11/16 2241 04/13/16 0326  NA 137 142  K 3.1* 3.1*  CL 108 109  CO2 24 27  GLUCOSE 113* 97  BUN <5* <5*  CREATININE 0.65 0.63  CALCIUM 8.8* 8.2*    PT/INR  No results for input(s):  LABPROT, INR in the last 72 hours.  ABG  No results for input(s): PHART, HCO3 in the last 72 hours.  Invalid input(s): PCO2, PO2   Studies/Results:  Ct Abdomen Pelvis Wo Contrast  04/12/2016  CLINICAL DATA:  Right-sided abdominal pain.  Recent cholecystectomy. EXAM: CT ABDOMEN AND PELVIS WITHOUT CONTRAST TECHNIQUE: Multidetector CT imaging of the abdomen and pelvis was performed following the standard protocol without IV contrast. COMPARISON:  04/08/2016 MRI FINDINGS: Lower chest and abdominal wall: Changes of recent laparoscopic surgery. No signs of abscess. Trace pleural effusions, greater on the right, with minimal atelectasis. Hepatobiliary: Recent cholecystectomy with expected fluid and stranding in the operative bed. There is a surgical drain in the subhepatic space. No concerning ascites. No visible choledocholithiasis. No biliary distention. Pancreas: Unremarkable. Spleen: Unremarkable. Adrenals/Urinary Tract: Negative adrenals. No hydronephrosis or stone. Unremarkable bladder. Stomach/Bowel:  No obstruction. No appendicitis. Reproductive:IUD in place. Vascular/Lymphatic: No acute vascular abnormality. No mass or adenopathy. Other: Trace pelvic fluid. No perihepatic ascites. Small volume residual pneumoperitoneum. Musculoskeletal: Expansile sacral Tarlov cysts on the left at S2, incidental in this setting. IMPRESSION: 1. No unexpected finding after recent cholecystectomy. 2. Trace pleural effusions and mild basilar atelectasis. Electronically Signed   By: Monte Fantasia M.D.   On:  04/12/2016 04:00   Dg Ercp Biliary & Pancreatic Ducts  04/12/2016  CLINICAL DATA:  50 year old female undergoing ERCP. History of recent cholecystectomy. EXAM: ERCP TECHNIQUE: Multiple spot images obtained with the fluoroscopic device and submitted for interpretation post-procedure. FLUOROSCOPY TIME:  If the device does not provide the exposure index: Fluoroscopy Time:  9 minutes 40 seconds reported COMPARISON:  CT  abdomen/pelvis 04/12/2016; abdominal ultrasound 04/12/2016 FINDINGS: Two intraoperative spot images demonstrate a flexible endoscope in the descending duodenum with cannulation of the common bile duct. Partial opacification of the biliary tree is also noted. A short pigtail drain is present in the pancreatic duct. IMPRESSION: ERCP and pancreatic duct placement. These images were submitted for radiologic interpretation only. Please see the procedural report for the amount of contrast and the fluoroscopy time utilized. Electronically Signed   By: Jacqulynn Cadet M.D.   On: 04/12/2016 13:42   US Abdomen Limited Ruq  04/12/2016  CLINICAL DATA:  Postoperative pain. Diagnosed with cholelithiasis and pancreatitis 4 days ago, now with severe abdominal pain. EXAM: US ABDOMEN LIMITED - RIGHT UPPER QUADRANT COMPARISON:  CT abdomen and pelvis April 12, 2016 at 0345 hours FINDINGS: Gallbladder: Status post cholecystectomy without fluid collection in the gallbladder fossa. Common bile duct: Diameter: 2 mm Liver: No focal lesion identified. Within normal limits in parenchymal echogenicity. Hepatopetal portal vein. Small RIGHT pleural effusion as seen on today CT. IMPRESSION: Status post cholecystectomy.  No acute RIGHT upper quadrant process. Electronically Signed   By: Elon Alas M.D.   On: 04/12/2016 04:53     Anti-infectives:   Anti-infectives    None      [name] Overall, MD, FACS Pager: [facility] Office: [phone] 04/13/2016

## 2016-04-13 NOTE — Progress Notes (Signed)
Subjective: Right-sided abdominal discomfort, about same now cf pre-procedure. Feels bloated.  Objective: Vital signs in last 24 hours: Temp:  [97.6 F (36.4 C)-98.7 F (37.1 C)] 98.6 F (37 C) (07/22 0537) Pulse Rate:  [53-86] 72 (07/22 0537) Resp:  [9-12] 12 (07/22 0537) BP: (99-128)/(46-102) 101/59 mmHg (07/22 0537) SpO2:  [96 %-99 %] 97 % (07/22 0537) Weight change:  Last BM Date: 04/08/16  PE: GEN:  Somewhat uncomfortable-appearing but non-toxic ABD:  Right-sided tenderness, hypoactive but present bowel sounds; JP drain ~ 500 cc yesterday and about 150 cc today.  Lab Results: CBC    Component Value Date/Time   WBC 9.2 04/13/2016 0326   RBC 3.76* 04/13/2016 0326   HGB 11.1* 04/13/2016 0326   HCT 33.5* 04/13/2016 0326   PLT 224 04/13/2016 0326   MCV 89.1 04/13/2016 0326   MCH 29.5 04/13/2016 0326   MCHC 33.1 04/13/2016 0326   RDW 14.0 04/13/2016 0326   LYMPHSABS 0.6* 04/12/2016 1429   MONOABS 0.2 04/12/2016 1429   EOSABS 0.1 04/12/2016 1429   BASOSABS 0.0 04/12/2016 1429   CMP     Component Value Date/Time   NA 142 04/13/2016 0326   K 3.1* 04/13/2016 0326   CL 109 04/13/2016 0326   CO2 27 04/13/2016 0326   GLUCOSE 97 04/13/2016 0326   BUN <5* 04/13/2016 0326   CREATININE 0.63 04/13/2016 0326   CREATININE 0.64 04/28/2014 0900   CALCIUM 8.2* 04/13/2016 0326   PROT 4.9* 04/13/2016 0326   ALBUMIN 2.8* 04/13/2016 0326   AST 36 04/13/2016 0326   ALT 57* 04/13/2016 0326   ALKPHOS 63 04/13/2016 0326   BILITOT 0.7 04/13/2016 0326   GFRNONAA >60 04/13/2016 0326   GFRAA >60 04/13/2016 0326   Assessment:  1.  Gallstone pancreatitis. 2.  Post-operative (cholecystectomy) bile leak. 3.  Abdominal pain, likely multifactorial.  Plan:  1.  Follow clinically. 2.  Follow JP drain output; if doesn't significantly decrease over the next few days, might have to consider repeat ERCP with retry biliary stent placement. 3.  Clear liquid diet only. 4.  Simethicone per  patient request for bloating. 5.  Case discussed with Dr. [name] Overall. 6.  [facility] will follow.   Landry Dyke 04/13/2016, 11:25 AM   Pager [phone] If no answer or after 5 PM call [phone]

## 2016-04-13 NOTE — Progress Notes (Signed)
RN reported temp 100.8, hemodynamically stable  Blood cultures ordered Started imipenem  DTat

## 2016-04-13 NOTE — Progress Notes (Signed)
PROGRESS NOTE  Laurie Terry [id number] DOB: 31-Jan-1966 DOA: 04/12/2016 PCP: Antony Blackbird, MD  Brief History:  50 year old female with a remote history of melanoma and cervical dysplasia presented with increasing abdominal pain associated with increased drainage and change of character of drainage from her JP drain.  The patient was recently hospitalized from 04/06/2016 through 04/11/2016 for biliary pancreatitis for which she had a laparoscopic cholecystectomy on 04/09/2016. She was discharged in stable condition after she tolerated a soft diet. Unfortunately, she began developing increasing epigastric pain after eating at home. She subsequently ate the second half of her meal with increasing pain. Because of the changes in her JP drainage and abdominal pain she presented to the emergency department. CT of the abdomen and pelvis on 04/12/2016 revealed no unexpected findings status post cholecystectomy. There was expected fluid in the operative bed, and the surgical drain was in the subhepatic space. GI and general surgery were consulted.  04/12/16 ERCP with sphincterotomy was performed by Dr. Watt Climes, but a stent was unable to be placed. The patient was admitted for continued observation post ERCP and concerns for biliary leak.  Previous records reviewed and summarized  Assessment/Plan: Postoperative biliary leak status post laparoscopic cholecystectomy -Appreciate GI and general surgery followup -control pain -monitor JP drainage--505cc in past 24 hours -continue IVF -presently afebrile without leukocytosis -04/12/2016 CT abdomen and pelvis no unexpected finding status post laparoscopic cholecystectomy. Expect fluid seen in the operative bed with a surgical drain in the subhepatic space -04/12/2016 ERCP with sphincterotomy --Dr. Watt Climes  Hypokalemia  -Replete  -Check magnesium   Paroxysmal supraventricular tachycardia  -Presently in sinus rhythm  -TSH 0.457, free T4 0.97    -04/11/2016 echo EF 55-60%, no WMA   Disposition Plan:   Home in 2-3 days  Family Communication:   Mother updated at bedside  Consultants:  GI--[facility]             General Surgery  Code Status:  FULL  DVT Prophylaxis:  SCDs   Procedures: As Listed in Progress Note Above  Antibiotics: None    Subjective: Patient complains of slight increase in abdominal pain with sitting up this morning. Mostly in the right upper quadrant and epigastric area that is sharp in nature. Denies any nausea, vomiting, diarrhea. No bowel movement in approximately one week but passing flatus. Denies any fevers, chills, chest pain, shortness breath, headache, neck pain.  Objective: Filed Vitals:   04/12/16 1330 04/12/16 1340 04/12/16 2212 04/13/16 0537  BP: 112/64 114/59 99/60 101/59  Pulse: 53 56 69 72  Temp:   98.7 F (37.1 C) 98.6 F (37 C)  TempSrc:   Oral Oral  Resp: 11 9 12 12   SpO2: 96% 97% 98% 97%    Intake/Output Summary (Last 24 hours) at 04/13/16 0731 Last data filed at 04/13/16 0539  Gross per 24 hour  Intake 2171.25 ml  Output   2825 ml  Net -653.75 ml   Weight change:  Exam:   General:  Pt is alert, follows commands appropriately, not in acute distress  HEENT: No icterus, No thrush, No neck mass, Green/AT  Cardiovascular: RRR, S1/S2, no rubs, no gallops  Respiratory: CTA bilaterally, no wheezing, no crackles, no rhonchi  Abdomen: Soft/+BS, RUQ and epigastric tenderness, non distended, no rebound  Extremities: No edema, No lymphangitis, No petechiae, No rashes, no synovitis   Data Reviewed: I have personally reviewed following labs and imaging studies Basic Metabolic Panel:  Recent  Labs Lab 04/07/16 0331 04/08/16 0521 04/09/16 0505 04/10/16 0555 04/11/16 2241 04/12/16 0458 04/13/16 0326  NA 142 139 140 137 137  --  142  K 3.4* 3.6 3.9 4.0 3.1*  --  3.1*  CL 111 114* 112* 106 108  --  109  CO2 24 22 22 23 24   --  27  GLUCOSE 108* 80 75 104* 113*  --  97   BUN 10 7 <5* <5* <5*  --  <5*  CREATININE 0.58 0.58 0.62 0.54 0.65  --  0.63  CALCIUM 8.1* 7.8* 8.0* 8.3* 8.8*  --  8.2*  MG 2.0  --   --   --   --  1.8  --   PHOS 3.4  --   --   --   --  3.2  --    Liver Function Tests:  Recent Labs Lab 04/08/16 0521 04/09/16 0505 04/10/16 0555 04/11/16 2241 04/13/16 0326  AST 98* 42* 27 19 36  ALT 174* 130* 101* 69* 57*  ALKPHOS 84 84 84 76 63  BILITOT 1.7* 1.1 1.2 0.5 0.7  PROT 4.8* 5.0* 5.2* 5.7* 4.9*  ALBUMIN 2.9* 2.9* 3.1* 3.4* 2.8*    Recent Labs Lab 04/07/16 0331 04/08/16 0521 04/09/16 0505 04/11/16 2241 04/13/16 0326  LIPASE 5793* 363* 65* 33 45   No results for input(s): AMMONIA in the last 168 hours. Coagulation Profile: No results for input(s): INR, PROTIME in the last 168 hours. CBC:  Recent Labs Lab 04/09/16 0505 04/10/16 0555 04/11/16 2241 04/12/16 1429 04/13/16 0326  WBC 6.2 9.0 7.9 8.0 9.2  NEUTROABS  --   --   --  7.1  --   HGB 11.3* 11.6* 12.1 12.3 11.1*  HCT 35.5* 35.3* 36.9 37.8 33.5*  MCV 90.8 89.4 88.5 89.8 89.1  PLT 174 206 249 228 224   Cardiac Enzymes:  Recent Labs Lab 04/06/16 2148 04/07/16 0331 04/07/16 1030 04/07/16 1511 04/12/16 0458  TROPONINI <0.03 <0.03 <0.03 <0.03 <0.03   BNP: Invalid input(s): POCBNP CBG: No results for input(s): GLUCAP in the last 168 hours. HbA1C: No results for input(s): HGBA1C in the last 72 hours. Urine analysis:    Component Value Date/Time   COLORURINE YELLOW 04/11/2016 2307   APPEARANCEUR CLEAR 04/11/2016 2307   LABSPEC 1.013 04/11/2016 2307   PHURINE 5.5 04/11/2016 2307   GLUCOSEU NEGATIVE 04/11/2016 2307   HGBUR TRACE* 04/11/2016 2307   BILIRUBINUR NEGATIVE 04/11/2016 2307   KETONESUR 15* 04/11/2016 2307   PROTEINUR NEGATIVE 04/11/2016 2307   UROBILINOGEN 0.2 03/30/2015 0852   NITRITE NEGATIVE 04/11/2016 2307   LEUKOCYTESUR NEGATIVE 04/11/2016 2307   Sepsis Labs: @LABRCNTIP (procalcitonin:4,lacticidven:4) )No results found for this or  any previous visit (from the past 240 hour(s)).   Scheduled Meds: . famotidine (PEPCID) IV  20 mg Intravenous Q12H   Continuous Infusions: . dextrose 5 % and 0.9 % NaCl with KCl 20 mEq/L 75 mL/hr at 04/12/16 1935  . lactated ringers Stopped (04/12/16 1231)    Procedures/Studies: Ct Abdomen Pelvis Wo Contrast  04/12/2016  CLINICAL DATA:  Right-sided abdominal pain.  Recent cholecystectomy. EXAM: CT ABDOMEN AND PELVIS WITHOUT CONTRAST TECHNIQUE: Multidetector CT imaging of the abdomen and pelvis was performed following the standard protocol without IV contrast. COMPARISON:  04/08/2016 MRI FINDINGS: Lower chest and abdominal wall: Changes of recent laparoscopic surgery. No signs of abscess. Trace pleural effusions, greater on the right, with minimal atelectasis. Hepatobiliary: Recent cholecystectomy with expected fluid and stranding in the operative bed. There is  a surgical drain in the subhepatic space. No concerning ascites. No visible choledocholithiasis. No biliary distention. Pancreas: Unremarkable. Spleen: Unremarkable. Adrenals/Urinary Tract: Negative adrenals. No hydronephrosis or stone. Unremarkable bladder. Stomach/Bowel:  No obstruction. No appendicitis. Reproductive:IUD in place. Vascular/Lymphatic: No acute vascular abnormality. No mass or adenopathy. Other: Trace pelvic fluid. No perihepatic ascites. Small volume residual pneumoperitoneum. Musculoskeletal: Expansile sacral Tarlov cysts on the left at S2, incidental in this setting. IMPRESSION: 1. No unexpected finding after recent cholecystectomy. 2. Trace pleural effusions and mild basilar atelectasis. Electronically Signed   By: Monte Fantasia M.D.   On: 04/12/2016 04:00   Dg Chest 2 View  04/06/2016  CLINICAL DATA:  50 year old female with chest pain EXAM: CHEST  2 VIEW COMPARISON:  Chest radiograph dated 03/29/2015 FINDINGS: The heart size and mediastinal contours are within normal limits. Both lungs are clear. The visualized skeletal  structures are unremarkable. IMPRESSION: No active cardiopulmonary disease. Electronically Signed   By: Anner Crete M.D.   On: 04/06/2016 22:16   US Abdomen Complete  04/06/2016  CLINICAL DATA:  Right back pain and epigastric pain with nausea for 8 months. History of melanoma. EXAM: ABDOMEN ULTRASOUND COMPLETE COMPARISON:  None. FINDINGS: Gallbladder: Several stones in the gallbladder, largest measuring 1.9 cm diameter. Small amount of sludge layering in the gallbladder. No gallbladder wall thickening. Murphy's sign is negative. Common bile duct: Diameter: 8 mm.  Upper limits of normal range. Liver: No focal lesion identified. Within normal limits in parenchymal echogenicity. IVC: No abnormality visualized. Pancreas: Visualized portion unremarkable. Spleen: Size and appearance within normal limits. Right Kidney: Length: 10.9 cm. Echogenicity within normal limits. No mass or hydronephrosis visualized. Left Kidney: Length: 10.8 cm. Echogenicity within normal limits. No mass or hydronephrosis visualized. Abdominal aorta: No aneurysm visualized. Other findings: None. IMPRESSION: Cholelithiasis and gallbladder sludge. No additional changes to suggest cholecystitis. Extrahepatic bile duct measures 8 mm diameter, representing upper limits of normal range. Electronically Signed   By: Lucienne Capers M.D.   On: 04/06/2016 23:35   Mr Abdomen Mrcp Wo Cm  04/08/2016  CLINICAL DATA:  Gallstone pancreatitis. Past medical history of melanoma 15 years ago. Epigastric abdominal pain. Elevated lipase and liver function tests. EXAM: MRI ABDOMEN WITHOUT CONTRAST  (INCLUDING MRCP) TECHNIQUE: Multiplanar multisequence MR imaging of the abdomen was performed. Heavily T2-weighted images of the biliary and pancreatic ducts were obtained, and three-dimensional MRCP images were rendered by post processing. COMPARISON:  04/06/2016 abdominal ultrasound. FINDINGS: Mild degradation secondary to motion and less so lack of IV contrast.  Lower chest: Trace right larger than left pleural effusions. Normal heart size. Hepatobiliary: No focal liver lesion. No intrahepatic ductal dilatation. Gallstones including at 16 mm. Moderate-to-marked gallbladder wall thickening, including at up to 12 mm on image 22/series 4. No common duct dilatation.  No choledocholithiasis. Pancreas: No pancreatic duct dilatation or dominant mass. There is nonspecific edema within the anterior pararenal space, including adjacent the tail on image 19/series 8. No peripancreatic fluid collection. Spleen: Normal in size, without focal abnormality. Adrenals/Urinary Tract: Normal adrenal glands. Normal kidneys, without hydronephrosis. Stomach/Bowel: Normal stomach and abdominal bowel loops. Vascular/Lymphatic: Normal caliber of the aorta and branch vessels. No retroperitoneal or retrocrural adenopathy. Other: Small volume perihepatic and perisplenic ascites. Musculoskeletal: No acute osseous abnormality. IMPRESSION: 1. Mild degradation secondary to motion and lack of IV contrast. 2. No evidence of biliary duct dilatation or choledocholithiasis. 3. Cholelithiasis with gallbladder wall thickening. This wall thickening is new since 04/06/2016 ultrasound. If acute cholecystitis is a concern, consider  repeat ultrasound or nuclear medicine hepatobiliary study. 4. No specific findings of pancreatitis or its complications. There is minimal ascites with nonspecific edema throughout the anterior pararenal space. 5. Small bilateral pleural effusions. Electronically Signed   By: Abigail Miyamoto M.D.   On: 04/08/2016 15:46   Dg Ercp Biliary & Pancreatic Ducts  04/12/2016  CLINICAL DATA:  50 year old female undergoing ERCP. History of recent cholecystectomy. EXAM: ERCP TECHNIQUE: Multiple spot images obtained with the fluoroscopic device and submitted for interpretation post-procedure. FLUOROSCOPY TIME:  If the device does not provide the exposure index: Fluoroscopy Time:  9 minutes 40 seconds  reported COMPARISON:  CT abdomen/pelvis 04/12/2016; abdominal ultrasound 04/12/2016 FINDINGS: Two intraoperative spot images demonstrate a flexible endoscope in the descending duodenum with cannulation of the common bile duct. Partial opacification of the biliary tree is also noted. A short pigtail drain is present in the pancreatic duct. IMPRESSION: ERCP and pancreatic duct placement. These images were submitted for radiologic interpretation only. Please see the procedural report for the amount of contrast and the fluoroscopy time utilized. Electronically Signed   By: Jacqulynn Cadet M.D.   On: 04/12/2016 13:42   US Abdomen Limited Ruq  04/12/2016  CLINICAL DATA:  Postoperative pain. Diagnosed with cholelithiasis and pancreatitis 4 days ago, now with severe abdominal pain. EXAM: US ABDOMEN LIMITED - RIGHT UPPER QUADRANT COMPARISON:  CT abdomen and pelvis April 12, 2016 at 0345 hours FINDINGS: Gallbladder: Status post cholecystectomy without fluid collection in the gallbladder fossa. Common bile duct: Diameter: 2 mm Liver: No focal lesion identified. Within normal limits in parenchymal echogenicity. Hepatopetal portal vein. Small RIGHT pleural effusion as seen on today CT. IMPRESSION: Status post cholecystectomy.  No acute RIGHT upper quadrant process. Electronically Signed   By: Elon Alas M.D.   On: 04/12/2016 04:53    Janziel Hockett, DO  [facility] Pager [phone]  If 7PM-7AM, please contact night-coverage [email or web address] Password [facility] 04/13/2016, 7:31 AM

## 2016-04-14 ENCOUNTER — Inpatient Hospital Stay (HOSPITAL_COMMUNITY): Payer: 59

## 2016-04-14 LAB — URINE CULTURE

## 2016-04-14 LAB — COMPREHENSIVE METABOLIC PANEL
ALBUMIN: 2.7 g/dL — AB (ref 3.5–5.0)
ALK PHOS: 70 U/L (ref 38–126)
ALT: 77 U/L — ABNORMAL HIGH (ref 14–54)
ANION GAP: 5 (ref 5–15)
AST: 53 U/L — ABNORMAL HIGH (ref 15–41)
BUN: <5 mg/dL — ABNORMAL LOW (ref 6–20)
CALCIUM: 8.1 mg/dL — AB (ref 8.9–10.3)
CO2: 26 mmol/L (ref 22–32)
Chloride: 105 mmol/L (ref 101–111)
Creatinine, Ser: 0.54 mg/dL (ref 0.44–1.00)
GFR calc non Af Amer: >60 mL/min (ref >60)
GLUCOSE: 116 mg/dL — AB (ref 65–99)
POTASSIUM: 3.4 mmol/L — AB (ref 3.5–5.1)
SODIUM: 136 mmol/L (ref 135–145)
TOTAL PROTEIN: 5.2 g/dL — AB (ref 6.5–8.1)
Total Bilirubin: 1.5 mg/dL — ABNORMAL HIGH (ref 0.3–1.2)

## 2016-04-14 LAB — MAGNESIUM: Magnesium: 1.8 mg/dL (ref 1.7–2.4)

## 2016-04-14 LAB — CBC
HEMATOCRIT: 36.6 % (ref 36.0–46.0)
HEMOGLOBIN: 11.6 g/dL — AB (ref 12.0–15.0)
MCH: 28.6 pg (ref 26.0–34.0)
MCHC: 31.7 g/dL (ref 30.0–36.0)
MCV: 90.4 fL (ref 78.0–100.0)
Platelets: 256 10*3/uL (ref 150–400)
RBC: 4.05 MIL/uL (ref 3.87–5.11)
RDW: 13.9 % (ref 11.5–15.5)
WBC: 11.5 10*3/uL — ABNORMAL HIGH (ref 4.0–10.5)

## 2016-04-14 MED ORDER — POTASSIUM CITRATE ER 10 MEQ (1080 MG) PO TBCR
10.0000 meq | EXTENDED_RELEASE_TABLET | Freq: Three times a day (TID) | ORAL | Status: DC
  Administered 2016-04-14 – 2016-04-18 (×11): 10 meq via ORAL
  Filled 2016-04-14 (×14): qty 1

## 2016-04-14 NOTE — Progress Notes (Signed)
Subjective: New type of right-sided abdominal pain yesterday and today, worse with movement. Fever last night 100.8 F.  Objective: Vital signs in last 24 hours: Temp:  [99.2 F (37.3 C)-100 F (37.8 C)] 99.2 F (37.3 C) (07/23 0457) Pulse Rate:  [84-85] 85 (07/23 0457) Resp:  [16-17] 17 (07/23 0457) BP: (108)/(47-62) 108/62 (07/23 0457) SpO2:  [95 %-97 %] 95 % (07/23 0457) Weight change:  Last BM Date: 04/08/16  PE: GEN:  NAD ABD:  Soft, RUQ drain in place (draining about 140 cc in last 24 hours, still bilious), mild right-sided tenderness without peritonitis  Lab Results: CBC    Component Value Date/Time   WBC 11.5 (H) 04/14/2016 0219   RBC 4.05 04/14/2016 0219   HGB 11.6 (L) 04/14/2016 0219   HCT 36.6 04/14/2016 0219   PLT 256 04/14/2016 0219   MCV 90.4 04/14/2016 0219   MCH 28.6 04/14/2016 0219   MCHC 31.7 04/14/2016 0219   RDW 13.9 04/14/2016 0219   LYMPHSABS 0.6 (L) 04/12/2016 1429   MONOABS 0.2 04/12/2016 1429   EOSABS 0.1 04/12/2016 1429   BASOSABS 0.0 04/12/2016 1429   CMP     Component Value Date/Time   NA 136 04/14/2016 0219   K 3.4 (L) 04/14/2016 0219   CL 105 04/14/2016 0219   CO2 26 04/14/2016 0219   GLUCOSE 116 (H) 04/14/2016 0219   BUN <5 (L) 04/14/2016 0219   CREATININE 0.54 04/14/2016 0219   CREATININE 0.64 04/28/2014 0900   CALCIUM 8.1 (L) 04/14/2016 0219   PROT 5.2 (L) 04/14/2016 0219   ALBUMIN 2.7 (L) 04/14/2016 0219   AST 53 (H) 04/14/2016 0219   ALT 77 (H) 04/14/2016 0219   ALKPHOS 70 04/14/2016 0219   BILITOT 1.5 (H) 04/14/2016 0219   GFRNONAA >60 04/14/2016 0219   GFRAA >60 04/14/2016 [id number]   Assessment:  1.  Gallstone pancreatitis. 2.  Post-operative (cholecystectomy) bile leak. 3.  Abdominal pain, likely multifactorial. 4.  Fever and slight leukocytosis, new over past 24 hours. 5.  Elevated LFTs, slight interval increase over the past 24 hours.  Plan:  1.  Continue clear liquids per surgical team. 2.  Continue  antibiotics. 3.  If patient doesn't turn the corner, might need to repeat CT scan early next week, might need retry ERCP. 4.  Case discussed with Dr. Lucia Gaskins of [facility].   Landry Dyke 04/14/2016, 12:28 PM   Pager [phone] If no answer or after 5 PM call [phone]

## 2016-04-14 NOTE — Progress Notes (Signed)
PROGRESS NOTE  Laurie Terry [id number] DOB: Feb 15, 1966 DOA: 04/12/2016 PCP: Antony Blackbird, MD Brief History:  49 year old female with a remote history of melanoma and cervical dysplasia presented with increasing abdominal pain associated with increased drainage and change of character of drainage from her JP drain.  The patient was recently hospitalized from 04/06/2016 through 04/11/2016 for biliary pancreatitis for which she had a laparoscopic cholecystectomy on 04/09/2016. She was discharged in stable condition after she tolerated a soft diet. Unfortunately, she began developing increasing epigastric pain after eating at home. She subsequently ate the second half of her meal with increasing pain. Because of the changes in her JP drainage and abdominal pain she presented to the emergency department. CT of the abdomen and pelvis on 04/12/2016 revealed no unexpected findings status post cholecystectomy. There was expected fluid in the operative bed, and the surgical drain was in the subhepatic space. GI and general surgery were consulted.  04/12/16 ERCP with sphincterotomy was performed by Dr. Watt Climes, but a stent was unable to be placed. The patient was admitted for continued observation post ERCP and concerns for biliary leak.  Previous records reviewed and summarized  Assessment/Plan: Postoperative biliary leak status post laparoscopic cholecystectomy -Appreciate GI and general surgery followup -control pain -monitor JP drainage--140cc in past 24 hours -continue IVF -04/12/2016 CT abdomen and pelvis no unexpected finding status post laparoscopic cholecystectomy. Expect fluid seen in the operative bed with a surgical drain in the subhepatic space -04/12/2016 ERCP with sphincterotomy --Dr. Watt Climes -04/14/16 overall pain is better, but has new "sharp" midline pain  Fever -UA unrevealing -continue imipenem D#2 -follow blood culture -hemodynamically stable  Hypokalemia  -Repleted   -Check magnesium--1.8  Paroxysmal supraventricular tachycardia  -Presently in sinus rhythm  -TSH 0.457, free T4 0.97  -04/11/2016 echo EF 55-60%, no WMA    Disposition Plan:   Home in 2-3 days  Family Communication:   Son updated at bedside  Consultants:  GI--[facility]/Outlaw                        General Surgery  Code Status:  FULL  DVT Prophylaxis:  SCDs   Procedures: As Listed in Progress Note Above  Antibiotics: Imipenem 04/13/16>>>   Subjective: Patient complains of a new sharp pain sensation in the midline that woke her up from sleep last night. Overall, she says that her pain is better than yesterday. Denies any fevers, chills, nausea, vomiting, diarrhea. No bowel movement of frequent flatus. Denies any headache, neck pain, chest pain, shortness breath.  Objective: Vitals:   04/13/16 1506 04/13/16 1950 04/13/16 2059 04/14/16 0457  BP:   (!) 108/47 108/62  Pulse:   84 85  Resp:   16 17  Temp: 100 F (37.8 C) 99.4 F (37.4 C) 99.4 F (37.4 C) 99.2 F (37.3 C)  TempSrc: Oral Oral Oral Oral  SpO2:   97% 95%    Intake/Output Summary (Last 24 hours) at 04/14/16 1239 Last data filed at 04/14/16 0500  Gross per 24 hour  Intake          2193.75 ml  Output             1060 ml  Net          1133.75 ml   Weight change:  Exam:   General:  Pt is alert, follows commands appropriately, not in acute distress  HEENT: No icterus, No thrush, No neck mass,  [place]/AT  Cardiovascular: RRR, S1/S2, no rubs, no gallops  Respiratory: CTA bilaterally, no wheezing, no crackles, no rhonchi  Abdomen: Soft/+BS, epigastric, RUQ pain without rebound non distended, no guarding  Extremities: No edema, No lymphangitis, No petechiae, No rashes, no synovitis   Data Reviewed: I have personally reviewed following labs and imaging studies Basic Metabolic Panel:  Recent Labs Lab 04/09/16 0505 04/10/16 0555 04/11/16 2241 04/12/16 0458 04/13/16 0326 04/14/16 0219  NA  140 137 137  --  142 136  K 3.9 4.0 3.1*  --  3.1* 3.4*  CL 112* 106 108  --  109 105  CO2 22 23 24   --  27 26  GLUCOSE 75 104* 113*  --  97 116*  BUN <5* <5* <5*  --  <5* <5*  CREATININE 0.62 0.54 0.65  --  0.63 0.54  CALCIUM 8.0* 8.3* 8.8*  --  8.2* 8.1*  MG  --   --   --  1.8  --  1.8  PHOS  --   --   --  3.2  --   --    Liver Function Tests:  Recent Labs Lab 04/09/16 0505 04/10/16 0555 04/11/16 2241 04/13/16 0326 04/14/16 0219  AST 42* 27 19 36 53*  ALT 130* 101* 69* 57* 77*  ALKPHOS 84 84 76 63 70  BILITOT 1.1 1.2 0.5 0.7 1.5*  PROT 5.0* 5.2* 5.7* 4.9* 5.2*  ALBUMIN 2.9* 3.1* 3.4* 2.8* 2.7*    Recent Labs Lab 04/08/16 0521 04/09/16 0505 04/11/16 2241 04/13/16 0326  LIPASE 363* 65* 33 45   No results for input(s): AMMONIA in the last 168 hours. Coagulation Profile: No results for input(s): INR, PROTIME in the last 168 hours. CBC:  Recent Labs Lab 04/10/16 0555 04/11/16 2241 04/12/16 1429 04/13/16 0326 04/14/16 0219  WBC 9.0 7.9 8.0 9.2 11.5*  NEUTROABS  --   --  7.1  --   --   HGB 11.6* 12.1 12.3 11.1* 11.6*  HCT 35.3* 36.9 37.8 33.5* 36.6  MCV 89.4 88.5 89.8 89.1 90.4  PLT 206 249 228 224 256   Cardiac Enzymes:  Recent Labs Lab 04/07/16 1511 04/12/16 0458  TROPONINI <0.03 <0.03   BNP: Invalid input(s): POCBNP CBG: No results for input(s): GLUCAP in the last 168 hours. HbA1C: No results for input(s): HGBA1C in the last 72 hours. Urine analysis:    Component Value Date/Time   COLORURINE YELLOW 04/13/2016 [place] 04/13/2016 1252   LABSPEC 1.010 04/13/2016 1252   PHURINE 7.5 04/13/2016 1252   GLUCOSEU NEGATIVE 04/13/2016 1252   HGBUR NEGATIVE 04/13/2016 1252   BILIRUBINUR NEGATIVE 04/13/2016 1252   KETONESUR 15 (A) 04/13/2016 1252   PROTEINUR NEGATIVE 04/13/2016 1252   UROBILINOGEN 0.2 03/30/2015 0852   NITRITE NEGATIVE 04/13/2016 1252   LEUKOCYTESUR NEGATIVE 04/13/2016 1252   Sepsis  Labs: @LABRCNTIP (procalcitonin:4,lacticidven:4) ) Recent Results (from the past 240 hour(s))  Urine culture     Status: Abnormal   Collection Time: 04/13/16 12:52 PM  Result Value Ref Range Status   Specimen Description URINE, CLEAN CATCH  Final   Special Requests NONE  Final   Culture MULTIPLE SPECIES PRESENT, SUGGEST RECOLLECTION (A)  Final   Report Status 04/14/2016 FINAL  Final  MRSA PCR Screening     Status: None   Collection Time: 04/13/16  1:15 PM  Result Value Ref Range Status   MRSA by PCR NEGATIVE NEGATIVE Final    Comment:  The GeneXpert MRSA Assay (FDA approved for NASAL specimens only), is one component of a comprehensive MRSA colonization surveillance program. It is not intended to diagnose MRSA infection nor to guide or monitor treatment for MRSA infections.      Scheduled Meds: . enoxaparin (LOVENOX) injection  40 mg Subcutaneous Q24H  . famotidine (PEPCID) IV  20 mg Intravenous Q12H  . imipenem-cilastatin  500 mg Intravenous Q6H  . potassium citrate  10 mEq Oral TID WC   Continuous Infusions: . dextrose 5 % and 0.9 % NaCl with KCl 20 mEq/L 75 mL/hr at 04/14/16 0616  . lactated ringers Stopped (04/12/16 1231)    Procedures/Studies: Ct Abdomen Pelvis Wo Contrast  Result Date: 04/12/2016 CLINICAL DATA:  Right-sided abdominal pain.  Recent cholecystectomy. EXAM: CT ABDOMEN AND PELVIS WITHOUT CONTRAST TECHNIQUE: Multidetector CT imaging of the abdomen and pelvis was performed following the standard protocol without IV contrast. COMPARISON:  04/08/2016 MRI FINDINGS: Lower chest and abdominal wall: Changes of recent laparoscopic surgery. No signs of abscess. Trace pleural effusions, greater on the right, with minimal atelectasis. Hepatobiliary: Recent cholecystectomy with expected fluid and stranding in the operative bed. There is a surgical drain in the subhepatic space. No concerning ascites. No visible choledocholithiasis. No biliary distention. Pancreas:  Unremarkable. Spleen: Unremarkable. Adrenals/Urinary Tract: Negative adrenals. No hydronephrosis or stone. Unremarkable bladder. Stomach/Bowel:  No obstruction. No appendicitis. Reproductive:IUD in place. Vascular/Lymphatic: No acute vascular abnormality. No mass or adenopathy. Other: Trace pelvic fluid. No perihepatic ascites. Small volume residual pneumoperitoneum. Musculoskeletal: Expansile sacral Tarlov cysts on the left at S2, incidental in this setting. IMPRESSION: 1. No unexpected finding after recent cholecystectomy. 2. Trace pleural effusions and mild basilar atelectasis. Electronically Signed   By: Monte Fantasia M.D.   On: 04/12/2016 04:00   Dg Chest 2 View  Result Date: 04/06/2016 CLINICAL DATA:  50 year old female with chest pain EXAM: CHEST  2 VIEW COMPARISON:  Chest radiograph dated 03/29/2015 FINDINGS: The heart size and mediastinal contours are within normal limits. Both lungs are clear. The visualized skeletal structures are unremarkable. IMPRESSION: No active cardiopulmonary disease. Electronically Signed   By: Anner Crete M.D.   On: 04/06/2016 22:16   US Abdomen Complete  Result Date: 04/06/2016 CLINICAL DATA:  Right back pain and epigastric pain with nausea for 8 months. History of melanoma. EXAM: ABDOMEN ULTRASOUND COMPLETE COMPARISON:  None. FINDINGS: Gallbladder: Several stones in the gallbladder, largest measuring 1.9 cm diameter. Small amount of sludge layering in the gallbladder. No gallbladder wall thickening. Murphy's sign is negative. Common bile duct: Diameter: 8 mm.  Upper limits of normal range. Liver: No focal lesion identified. Within normal limits in parenchymal echogenicity. IVC: No abnormality visualized. Pancreas: Visualized portion unremarkable. Spleen: Size and appearance within normal limits. Right Kidney: Length: 10.9 cm. Echogenicity within normal limits. No mass or hydronephrosis visualized. Left Kidney: Length: 10.8 cm. Echogenicity within normal limits. No  mass or hydronephrosis visualized. Abdominal aorta: No aneurysm visualized. Other findings: None. IMPRESSION: Cholelithiasis and gallbladder sludge. No additional changes to suggest cholecystitis. Extrahepatic bile duct measures 8 mm diameter, representing upper limits of normal range. Electronically Signed   By: Lucienne Capers M.D.   On: 04/06/2016 23:35   Mr Abdomen Mrcp Wo Cm  Result Date: 04/08/2016 CLINICAL DATA:  Gallstone pancreatitis. Past medical history of melanoma 15 years ago. Epigastric abdominal pain. Elevated lipase and liver function tests. EXAM: MRI ABDOMEN WITHOUT CONTRAST  (INCLUDING MRCP) TECHNIQUE: Multiplanar multisequence MR imaging of the abdomen was performed. Heavily T2-weighted images  of the biliary and pancreatic ducts were obtained, and three-dimensional MRCP images were rendered by post processing. COMPARISON:  04/06/2016 abdominal ultrasound. FINDINGS: Mild degradation secondary to motion and less so lack of IV contrast. Lower chest: Trace right larger than left pleural effusions. Normal heart size. Hepatobiliary: No focal liver lesion. No intrahepatic ductal dilatation. Gallstones including at 16 mm. Moderate-to-marked gallbladder wall thickening, including at up to 12 mm on image 22/series 4. No common duct dilatation.  No choledocholithiasis. Pancreas: No pancreatic duct dilatation or dominant mass. There is nonspecific edema within the anterior pararenal space, including adjacent the tail on image 19/series 8. No peripancreatic fluid collection. Spleen: Normal in size, without focal abnormality. Adrenals/Urinary Tract: Normal adrenal glands. Normal kidneys, without hydronephrosis. Stomach/Bowel: Normal stomach and abdominal bowel loops. Vascular/Lymphatic: Normal caliber of the aorta and branch vessels. No retroperitoneal or retrocrural adenopathy. Other: Small volume perihepatic and perisplenic ascites. Musculoskeletal: No acute osseous abnormality. IMPRESSION: 1. Mild  degradation secondary to motion and lack of IV contrast. 2. No evidence of biliary duct dilatation or choledocholithiasis. 3. Cholelithiasis with gallbladder wall thickening. This wall thickening is new since 04/06/2016 ultrasound. If acute cholecystitis is a concern, consider repeat ultrasound or nuclear medicine hepatobiliary study. 4. No specific findings of pancreatitis or its complications. There is minimal ascites with nonspecific edema throughout the anterior pararenal space. 5. Small bilateral pleural effusions. Electronically Signed   By: Abigail Miyamoto M.D.   On: 04/08/2016 15:46   Dg Ercp Biliary & Pancreatic Ducts  Result Date: 04/12/2016 CLINICAL DATA:  50 year old female undergoing ERCP. History of recent cholecystectomy. EXAM: ERCP TECHNIQUE: Multiple spot images obtained with the fluoroscopic device and submitted for interpretation post-procedure. FLUOROSCOPY TIME:  If the device does not provide the exposure index: Fluoroscopy Time:  9 minutes 40 seconds reported COMPARISON:  CT abdomen/pelvis 04/12/2016; abdominal ultrasound 04/12/2016 FINDINGS: Two intraoperative spot images demonstrate a flexible endoscope in the descending duodenum with cannulation of the common bile duct. Partial opacification of the biliary tree is also noted. A short pigtail drain is present in the pancreatic duct. IMPRESSION: ERCP and pancreatic duct placement. These images were submitted for radiologic interpretation only. Please see the procedural report for the amount of contrast and the fluoroscopy time utilized. Electronically Signed   By: Jacqulynn Cadet M.D.   On: 04/12/2016 13:42   US Abdomen Limited Ruq  Result Date: 04/12/2016 CLINICAL DATA:  Postoperative pain. Diagnosed with cholelithiasis and pancreatitis 4 days ago, now with severe abdominal pain. EXAM: US ABDOMEN LIMITED - RIGHT UPPER QUADRANT COMPARISON:  CT abdomen and pelvis April 12, 2016 at 0345 hours FINDINGS: Gallbladder: Status post  cholecystectomy without fluid collection in the gallbladder fossa. Common bile duct: Diameter: 2 mm Liver: No focal lesion identified. Within normal limits in parenchymal echogenicity. Hepatopetal portal vein. Small RIGHT pleural effusion as seen on today CT. IMPRESSION: Status post cholecystectomy.  No acute RIGHT upper quadrant process. Electronically Signed   By: Elon Alas M.D.   On: 04/12/2016 04:53    Lunden Mcleish, DO  [facility] Pager [phone]  If 7PM-7AM, please contact night-coverage [email or web address] Password [email or web address] 04/14/2016, 12:39 PM   LOS: 1 day

## 2016-04-14 NOTE — Progress Notes (Signed)
[name]:  [phone] General Surgery Progress Note   LOS: 1 day  POD -  2 Days Post-Op  Assessment/Plan: 1.  Lap chole - 04/09/2016 Laurie Terry  For gall stone pancreatitis  Has JP in place - 140 cc recorded last 24 hours, T Bili - 1.5  WBC - 11,600 - 04/14/2016  Primaxin - 7/22 >>>  Abdominal pain slightly worse  Will try to advance to full liquids (she is vegetarian)  2.  ENDOSCOPIC RETROGRADE CHOLANGIOPANCREATOGRAPHY (ERCP) - 04/12/2016 - [facility]  Did sphincterotomy, but could not place stent 3.  Hypokalemic  K+ - 3.4 - 04/14/2016  she could take po K+ 4.  DVT prophylaxis - to start lovenox   Principal Problem:   Postoperative bile leak Active Problems:   History of Gallstone pancreatitis   S/P laparoscopic cholecystectomy   Acute hypokalemia   Dehydration, mild   Paroxysmal SVT (supraventricular tachycardia) (HCC)   Bile leak, postoperative  Subjective:  Tender upper abdomen.  She says it hurts to take a deep breath.  Mother, Laurie Terry Patient, in room.  Objective:   Vitals:   04/13/16 2059 04/14/16 0457  BP: (!) 108/47 108/62  Pulse: 84 85  Resp: 16 17  Temp: 99.4 F (37.4 C) 99.2 F (37.3 C)     Intake/Output from previous day:  07/22 0701 - 07/23 0700 In: 2313.8 [P.O.:420; I.V.:1893.8] Out: 1540 [Urine:1400; Drains:140]  Intake/Output this shift:  No intake/output data recorded.   Physical Exam:   General: WN WF who is alert and oriented.    HEENT: Normal. Pupils equal. .   Lungs: Clear. IS only 750 cc.   Abdomen: Tender upper abdomen.  Decreased BS.     Wound: Drain in RUQ - 140 cc recorded last 24 hours.   Lab Results:     Recent Labs  04/13/16 0326 04/14/16 0219  WBC 9.2 11.5*  HGB 11.1* 11.6*  HCT 33.5* 36.6  PLT 224 256    BMET    Recent Labs  04/13/16 0326 04/14/16 0219  NA 142 136  K 3.1* 3.4*  CL 109 105  CO2 27 26  GLUCOSE 97 116*  BUN <5* <5*  CREATININE 0.63 0.54  CALCIUM 8.2* 8.1*    PT/INR  No  results for input(s): LABPROT, INR in the last 72 hours.  ABG  No results for input(s): PHART, HCO3 in the last 72 hours.  Invalid input(s): PCO2, PO2   Studies/Results:  Dg Ercp Biliary & Pancreatic Ducts  Result Date: 04/12/2016 CLINICAL DATA:  50 year old female undergoing ERCP. History of recent cholecystectomy. EXAM: ERCP TECHNIQUE: Multiple spot images obtained with the fluoroscopic device and submitted for interpretation post-procedure. FLUOROSCOPY TIME:  If the device does not provide the exposure index: Fluoroscopy Time:  9 minutes 40 seconds reported COMPARISON:  CT abdomen/pelvis 04/12/2016; abdominal ultrasound 04/12/2016 FINDINGS: Two intraoperative spot images demonstrate a flexible endoscope in the descending duodenum with cannulation of the common bile duct. Partial opacification of the biliary tree is also noted. A short pigtail drain is present in the pancreatic duct. IMPRESSION: ERCP and pancreatic duct placement. These images were submitted for radiologic interpretation only. Please see the procedural report for the amount of contrast and the fluoroscopy time utilized. Electronically Signed   By: Jacqulynn Cadet M.D.   On: 04/12/2016 13:42     Anti-infectives:   Anti-infectives    Start     Dose/Rate Route Frequency Ordered Stop   04/13/16 1315  imipenem-cilastatin (PRIMAXIN) 500 mg in sodium  chloride 0.9 % 100 mL IVPB     500 mg 200 mL/hr over 30 Minutes Intravenous Every 6 hours 04/13/16 1308        [name] Overall, MD, FACS Pager: [place] [facility] Office: [phone] 04/14/2016

## 2016-04-15 ENCOUNTER — Inpatient Hospital Stay (HOSPITAL_COMMUNITY): Payer: 59

## 2016-04-15 LAB — CBC WITH DIFFERENTIAL/PLATELET
BASOS PCT: 0 %
Basophils Absolute: 0 10*3/uL (ref 0.0–0.1)
EOS ABS: 0.3 10*3/uL (ref 0.0–0.7)
EOS PCT: 3 %
HCT: 35 % — ABNORMAL LOW (ref 36.0–46.0)
HEMOGLOBIN: 11.2 g/dL — AB (ref 12.0–15.0)
LYMPHS ABS: 0.9 10*3/uL (ref 0.7–4.0)
Lymphocytes Relative: 8 %
MCH: 28.7 pg (ref 26.0–34.0)
MCHC: 32 g/dL (ref 30.0–36.0)
MCV: 89.7 fL (ref 78.0–100.0)
MONOS PCT: 7 %
Monocytes Absolute: 0.9 10*3/uL (ref 0.1–1.0)
NEUTROS PCT: 82 %
Neutro Abs: 9.8 10*3/uL — ABNORMAL HIGH (ref 1.7–7.7)
PLATELETS: 249 10*3/uL (ref 150–400)
RBC: 3.9 MIL/uL (ref 3.87–5.11)
RDW: 13.9 % (ref 11.5–15.5)
WBC: 11.9 10*3/uL — ABNORMAL HIGH (ref 4.0–10.5)

## 2016-04-15 LAB — COMPREHENSIVE METABOLIC PANEL
ALBUMIN: 2.5 g/dL — AB (ref 3.5–5.0)
ALK PHOS: 72 U/L (ref 38–126)
ALT: 59 U/L — ABNORMAL HIGH (ref 14–54)
ANION GAP: 4 — AB (ref 5–15)
AST: 26 U/L (ref 15–41)
BUN: <5 mg/dL — ABNORMAL LOW (ref 6–20)
CALCIUM: 8 mg/dL — AB (ref 8.9–10.3)
CHLORIDE: 107 mmol/L (ref 101–111)
CO2: 27 mmol/L (ref 22–32)
Creatinine, Ser: 0.59 mg/dL (ref 0.44–1.00)
GFR calc non Af Amer: >60 mL/min (ref >60)
GLUCOSE: 116 mg/dL — AB (ref 65–99)
POTASSIUM: 3.5 mmol/L (ref 3.5–5.1)
SODIUM: 138 mmol/L (ref 135–145)
Total Bilirubin: 1 mg/dL (ref 0.3–1.2)
Total Protein: 5.1 g/dL — ABNORMAL LOW (ref 6.5–8.1)

## 2016-04-15 MED ORDER — DOCUSATE SODIUM 100 MG PO CAPS
100.0000 mg | ORAL_CAPSULE | Freq: Two times a day (BID) | ORAL | Status: DC
  Administered 2016-04-16 (×2): 100 mg via ORAL
  Filled 2016-04-15 (×4): qty 1

## 2016-04-15 MED ORDER — POLYETHYLENE GLYCOL 3350 17 G PO PACK
17.0000 g | PACK | Freq: Every day | ORAL | Status: DC | PRN
  Administered 2016-04-15: 17 g via ORAL
  Filled 2016-04-15: qty 1

## 2016-04-15 MED ORDER — SODIUM CHLORIDE 0.9 % IV SOLN
INTRAVENOUS | Status: DC
  Administered 2016-04-15: 15:00:00 via INTRAVENOUS
  Administered 2016-04-18: 1 mL via INTRAVENOUS

## 2016-04-15 MED ORDER — TECHNETIUM TC 99M MEBROFENIN IV KIT
5.2000 | PACK | Freq: Once | INTRAVENOUS | Status: AC | PRN
  Administered 2016-04-15: 5 via INTRAVENOUS

## 2016-04-15 MED ORDER — SENNA 8.6 MG PO TABS
2.0000 | ORAL_TABLET | Freq: Every day | ORAL | Status: DC
  Administered 2016-04-16: 17.2 mg via ORAL
  Filled 2016-04-15 (×2): qty 2

## 2016-04-15 MED ORDER — DOCUSATE SODIUM 100 MG PO CAPS
100.0000 mg | ORAL_CAPSULE | Freq: Every day | ORAL | Status: DC
  Administered 2016-04-15: 100 mg via ORAL
  Filled 2016-04-15: qty 1

## 2016-04-15 NOTE — Progress Notes (Signed)
Chief Complaint: Patient was seen in consultation today for  Chief Complaint  Patient presents with  . Post-op Problem    The patient advised she had pancreatitis,had a cholecystectomy tuesday and was discharged today.  She is back today because she started having severe abdominal pain after eating and it woke her out of her sleep.  She rates her pain 5/10.  The patient is taking tylenol for pain.  She doe still has a JP drain that has serosanguineous fluid.     at the request of Dr. Donnie Mesa  Referring Physician(s): Dr. Donnie Mesa  Supervising Physician: Markus Daft  Patient Status: Inpatient  History of Present Illness: Laurie Terry is a 50 y.o. female who underwent lap chole for gallbladder pancreatitis and biliary colic. She was readmitted with abd pain and output from her surgical drain highly suspicious for bile leak. She underwent ERCS with sphincterotomy but they were unable to place a stent. She has had continued output from her drain, though it is less bilious and seems to be trending down. It was recorded yesterday that she had 915m, but she does not feel there was nearly that much and may be recorded wrong. She had a HIDA scan today, which suggested evidence of a continued leak. IR was asked to evaluate for consideration of PTC and biliary drain. Chart, PMHx, imaging reviewed with Dr. HAnselm PancoastPt seen with Dr. HAnselm Pancoast Past Medical History:  Diagnosis Date  . Allergic rhinitis   . [place]    Melanoma  . Cervical dysplasia 2007   low-grade SIL, normal Pap smears since then  . Family history of adverse reaction to anesthesia    mom has h/o post op nausea/vomiting  . Intermittent palpitations   . IUD    inserted 03/2002.04/06/2007, 06/03/2012  . Ocular migraine   . Osteomalacia   . Seafood allergy     Past Surgical History:  Procedure Laterality Date  . CHOLECYSTECTOMY N/A 04/09/2016   Procedure: LAPAROSCOPIC CHOLECYSTECTOMY ;  Surgeon: MRolm Bookbinder MD;  [facility]  Service: General;  Laterality: N/A;  . ERCP  04/11/2016   ERCP with pancreatic stent and failed CBD stent after needle knife and conventional sphincterotomy  . ERCP N/A 04/12/2016   Procedure: ENDOSCOPIC RETROGRADE CHOLANGIOPANCREATOGRAPHY (ERCP);  Surgeon: MClarene Essex MD;  [facility];  Service: Endoscopy;  Laterality: N/A;  . FOOT SURGERY     toenail  . INTRAUTERINE DEVICE INSERTION  06/03/2012   Mirena  . removal of melanoma      Allergies: Iodine; Other; Ampicillin; Contrast media [iodinated diagnostic agents]; Levofloxacin; and Septra [bactrim]  Medications:  Current Facility-Administered Medications:  .  0.9 %  sodium chloride infusion, , Intravenous, Continuous, MDonnie Mesa MD, Last Rate: 100 mL/hr at 04/15/16 1449 .  acetaminophen (TYLENOL) tablet 650 mg, 650 mg, Oral, Q4H PRN, ASamella Parr NP, 650 mg at 04/15/16 1448 .  dextrose 5 % and 0.9 % NaCl with KCl 20 mEq/L infusion, , Intravenous, Continuous, ASamella Parr NP, Last Rate: 75 mL/hr at 04/14/16 2145 .  [START ON 04/16/2016] docusate sodium (COLACE) capsule 100 mg, 100 mg, Oral, BID, David Tat, MD .  enoxaparin (LOVENOX) injection 40 mg, 40 mg, Subcutaneous, Q24H, DAlphonsa Overall MD, 40 mg at 04/15/16 0931 .  famotidine (PEPCID) IVPB 20 mg premix, 20 mg, Intravenous, Q12H, ASamella Parr NP, 20 mg at 04/15/16 0931 .  imipenem-cilastatin (PRIMAXIN) 500 mg in sodium chloride 0.9 % 100 mL IVPB,  500 mg, Intravenous, Q6H, Orson Eva, MD, 500 mg at 04/15/16 1146 .  lactated ringers infusion, , Intravenous, Continuous, Clarene Essex, MD, Stopped at 04/12/16 1231 .  menthol-cetylpyridinium (CEPACOL) lozenge 3 mg, 1 lozenge, Oral, PRN, Lily Kocher, MD .  morphine 2 MG/ML injection 2 mg, 2 mg, Intravenous, Q3H PRN, Lily Kocher, MD, 2 mg at 04/15/16 0210 .  ondansetron (ZOFRAN) injection 4 mg, 4 mg, Intravenous, Q6H PRN, Lily Kocher, MD, 4 mg at 04/15/16 0834 .  phenol (CHLORASEPTIC)  mouth spray 1 spray, 1 spray, Mouth/Throat, PRN, Lily Kocher, MD .  polyethylene glycol (MIRALAX / GLYCOLAX) packet 17 g, 17 g, Oral, Daily PRN, Darci Current Simaan, PA-C, 17 g at 04/15/16 1612 .  potassium citrate (UROCIT-K) SR tablet 10 mEq, 10 mEq, Oral, TID WC, [name] Overall, MD, 10 mEq at 04/15/16 0737 .  promethazine (PHENERGAN) injection 12.5 mg, 12.5 mg, Intravenous, Q6H PRN, Rolm Bookbinder, MD, 12.5 mg at 04/15/16 0210 .  senna (SENOKOT) tablet 17.2 mg, 2 tablet, Oral, Daily, Orson Eva, MD .  simethicone [facility]) chewable tablet 80 mg, 80 mg, Oral, Q6H PRN, [name] Overall, MD, 80 mg at 04/14/16 1858 .  traMADol (ULTRAM) tablet 100 mg, 100 mg, Oral, Q12H PRN, [name] Overall, MD, 100 mg at 04/15/16 1740    Family History  Problem Relation Age of Onset  . Breast cancer Sister 47  . Arthritis Sister     Psoriatic  . Thyroid disease Sister   . Stroke Father   . Autoimmune disease Father   . CVA Father   . Breast cancer Maternal Grandmother     Age 41  . Tremor Mother   . Thyroid disease Daughter     Hypo    Social History   Social History  . Marital status: Married    Spouse name: N/A  . Number of children: N/A  . Years of education: N/A   Social History Main Topics  . Smoking status: Former Research scientist (life sciences)  . Smokeless tobacco: Never Used  . Alcohol use 0.0 oz/week     Comment: Rare  . Drug use: No  . Sexual activity: Yes    Birth control/ protection: IUD     Comment: Mirena 06/03/2012, declined insurance questions   Other Topics Concern  . None   Social History Narrative  . None     Review of Systems: A 12 point ROS discussed and pertinent positives are indicated in the HPI above.  All other systems are negative.  Review of Systems  Vital Signs: BP (!) 98/43 (BP Location: Left Arm)   Pulse 88   Temp 99.7 F (37.6 C) (Oral)   Resp 16   SpO2 97%   Physical Exam Pt comfortable, sitting up in chair. Says mild pains, but still has intermittent spells of increased  pain RUQ drain intact, site clean. Output appears mostly serosanguinous. Only 36m recorded so far today, about 272min bulb now.  Mallampati Score:  MD Evaluation Airway: WNL Heart: WNL Abdomen: WNL Chest/ Lungs: WNL ASA  Classification: 1 Mallampati/Airway Score: One  Imaging: Ct Abdomen Pelvis Wo Contrast  Result Date: 04/12/2016 CLINICAL DATA:  Right-sided abdominal pain.  Recent cholecystectomy. EXAM: CT ABDOMEN AND PELVIS WITHOUT CONTRAST TECHNIQUE: Multidetector CT imaging of the abdomen and pelvis was performed following the standard protocol without IV contrast. COMPARISON:  04/08/2016 MRI FINDINGS: Lower chest and abdominal wall: Changes of recent laparoscopic surgery. No signs of abscess. Trace pleural effusions, greater on the right, with minimal atelectasis. Hepatobiliary: Recent  cholecystectomy with expected fluid and stranding in the operative bed. There is a surgical drain in the subhepatic space. No concerning ascites. No visible choledocholithiasis. No biliary distention. Pancreas: Unremarkable. Spleen: Unremarkable. Adrenals/Urinary Tract: Negative adrenals. No hydronephrosis or stone. Unremarkable bladder. Stomach/Bowel:  No obstruction. No appendicitis. Reproductive:IUD in place. Vascular/Lymphatic: No acute vascular abnormality. No mass or adenopathy. Other: Trace pelvic fluid. No perihepatic ascites. Small volume residual pneumoperitoneum. Musculoskeletal: Expansile sacral Tarlov cysts on the left at S2, incidental in this setting. IMPRESSION: 1. No unexpected finding after recent cholecystectomy. 2. Trace pleural effusions and mild basilar atelectasis. Electronically Signed   By: Monte Fantasia M.D.   On: 04/12/2016 04:00   Dg Chest 2 View  Result Date: 04/06/2016 CLINICAL DATA:  50 year old female with chest pain EXAM: CHEST  2 VIEW COMPARISON:  Chest radiograph dated 03/29/2015 FINDINGS: The heart size and mediastinal contours are within normal limits. Both lungs are  clear. The visualized skeletal structures are unremarkable. IMPRESSION: No active cardiopulmonary disease. Electronically Signed   By: Anner Crete M.D.   On: 04/06/2016 22:16   Nm Hepatobiliary Liver Func  Result Date: 04/15/2016 CLINICAL DATA:  Postoperative bile leak. EXAM: NUCLEAR MEDICINE HEPATOBILIARY IMAGING TECHNIQUE: Sequential images of the abdomen were obtained out to 60 minutes following intravenous administration of radiopharmaceutical. RADIOPHARMACEUTICALS:  5.2 mCi Tc-74m Choletec IV COMPARISON:  Abdominal CT scan dated 04/12/2016 and abdominal MRI dated 04/08/2016 FINDINGS: Prompt uptake and biliary excretion of activity by the liver is seen. There is immediate leak each of the activity along the inferior aspect of the left lobe of the liver just above the distal stomach. Majority of the activity is excreted into the bowel. IMPRESSION: Bile leak extending along the undersurface of the left lobe of the liver. Electronically Signed   By: JLorriane ShireM.D.   On: 04/15/2016 14:31  UKoreaAbdomen Complete  Result Date: 04/06/2016 CLINICAL DATA:  Right back pain and epigastric pain with nausea for 8 months. History of melanoma. EXAM: ABDOMEN ULTRASOUND COMPLETE COMPARISON:  None. FINDINGS: Gallbladder: Several stones in the gallbladder, largest measuring 1.9 cm diameter. Small amount of sludge layering in the gallbladder. No gallbladder wall thickening. Murphy's sign is negative. Common bile duct: Diameter: 8 mm.  Upper limits of normal range. Liver: No focal lesion identified. Within normal limits in parenchymal echogenicity. IVC: No abnormality visualized. Pancreas: Visualized portion unremarkable. Spleen: Size and appearance within normal limits. Right Kidney: Length: 10.9 cm. Echogenicity within normal limits. No mass or hydronephrosis visualized. Left Kidney: Length: 10.8 cm. Echogenicity within normal limits. No mass or hydronephrosis visualized. Abdominal aorta: No aneurysm visualized.  Other findings: None. IMPRESSION: Cholelithiasis and gallbladder sludge. No additional changes to suggest cholecystitis. Extrahepatic bile duct measures 8 mm diameter, representing upper limits of normal range. Electronically Signed   By: WLucienne CapersM.D.   On: 04/06/2016 23:35   Mr Abdomen Mrcp Wo Cm  Result Date: 04/08/2016 CLINICAL DATA:  Gallstone pancreatitis. Past medical history of melanoma 15 years ago. Epigastric abdominal pain. Elevated lipase and liver function tests. EXAM: MRI ABDOMEN WITHOUT CONTRAST  (INCLUDING MRCP) TECHNIQUE: Multiplanar multisequence MR imaging of the abdomen was performed. Heavily T2-weighted images of the biliary and pancreatic ducts were obtained, and three-dimensional MRCP images were rendered by post processing. COMPARISON:  04/06/2016 abdominal ultrasound. FINDINGS: Mild degradation secondary to motion and less so lack of IV contrast. Lower chest: Trace right larger than left pleural effusions. Normal heart size. Hepatobiliary: No focal liver lesion. No intrahepatic ductal dilatation.  Gallstones including at 16 mm. Moderate-to-marked gallbladder wall thickening, including at up to 12 mm on image 22/series 4. No common duct dilatation.  No choledocholithiasis. Pancreas: No pancreatic duct dilatation or dominant mass. There is nonspecific edema within the anterior pararenal space, including adjacent the tail on image 19/series 8. No peripancreatic fluid collection. Spleen: Normal in size, without focal abnormality. Adrenals/Urinary Tract: Normal adrenal glands. Normal kidneys, without hydronephrosis. Stomach/Bowel: Normal stomach and abdominal bowel loops. Vascular/Lymphatic: Normal caliber of the aorta and branch vessels. No retroperitoneal or retrocrural adenopathy. Other: Small volume perihepatic and perisplenic ascites. Musculoskeletal: No acute osseous abnormality. IMPRESSION: 1. Mild degradation secondary to motion and lack of IV contrast. 2. No evidence of  biliary duct dilatation or choledocholithiasis. 3. Cholelithiasis with gallbladder wall thickening. This wall thickening is new since 04/06/2016 ultrasound. If acute cholecystitis is a concern, consider repeat ultrasound or nuclear medicine hepatobiliary study. 4. No specific findings of pancreatitis or its complications. There is minimal ascites with nonspecific edema throughout the anterior pararenal space. 5. Small bilateral pleural effusions. Electronically Signed   By: Abigail Miyamoto M.D.   On: 04/08/2016 15:46   Dg Ercp Biliary & Pancreatic Ducts  Result Date: 04/12/2016 CLINICAL DATA:  50 year old female undergoing ERCP. History of recent cholecystectomy. EXAM: ERCP TECHNIQUE: Multiple spot images obtained with the fluoroscopic device and submitted for interpretation post-procedure. FLUOROSCOPY TIME:  If the device does not provide the exposure index: Fluoroscopy Time:  9 minutes 40 seconds reported COMPARISON:  CT abdomen/pelvis 04/12/2016; abdominal ultrasound 04/12/2016 FINDINGS: Two intraoperative spot images demonstrate a flexible endoscope in the descending duodenum with cannulation of the common bile duct. Partial opacification of the biliary tree is also noted. A short pigtail drain is present in the pancreatic duct. IMPRESSION: ERCP and pancreatic duct placement. These images were submitted for radiologic interpretation only. Please see the procedural report for the amount of contrast and the fluoroscopy time utilized. Electronically Signed   By: Jacqulynn Cadet M.D.   On: 04/12/2016 13:42   Dg Abd Portable 1v  Result Date: 04/14/2016 CLINICAL DATA:  50 year old female with bloating and abdominal pain. EXAM: PORTABLE ABDOMEN - 1 VIEW COMPARISON:  CT dated 04/12/2016 FINDINGS: Right upper quadrant cholecystectomy clips and a drainage catheter noted. A foreign object with rounded tip in the right upper quadrant at the level of the L2-L3 disc space may represent a stent or a foreign object  overlying the patient. This was not seen on the prior CT. Clinical correlation is recommended. There is air throughout the colon. No small bowel dilatation or evidence of obstruction. No free air identified. No radiopaque calculi. Mild degenerative changes of spine. No acute fracture. IMPRESSION: No evidence of obstruction. Electronically Signed   By: Anner Crete M.D.   On: 04/14/2016 22:25  US Abdomen Limited Ruq  Result Date: 04/12/2016 CLINICAL DATA:  Postoperative pain. Diagnosed with cholelithiasis and pancreatitis 4 days ago, now with severe abdominal pain. EXAM: US ABDOMEN LIMITED - RIGHT UPPER QUADRANT COMPARISON:  CT abdomen and pelvis April 12, 2016 at 0345 hours FINDINGS: Gallbladder: Status post cholecystectomy without fluid collection in the gallbladder fossa. Common bile duct: Diameter: 2 mm Liver: No focal lesion identified. Within normal limits in parenchymal echogenicity. Hepatopetal portal vein. Small RIGHT pleural effusion as seen on today CT. IMPRESSION: Status post cholecystectomy.  No acute RIGHT upper quadrant process. Electronically Signed   By: Elon Alas M.D.   On: 04/12/2016 04:53    Labs:  CBC:  Recent Labs  04/12/16  1429 04/13/16 0326 04/14/16 0219 04/15/16 0425  WBC 8.0 9.2 11.5* 11.9*  HGB 12.3 11.1* 11.6* 11.2*  HCT 37.8 33.5* 36.6 35.0*  PLT 228 224 256 249    COAGS: No results for input(s): INR, APTT in the last 8760 hours.  BMP:  Recent Labs  04/11/16 2241 04/13/16 0326 04/14/16 0219 04/15/16 0425  NA 137 142 136 138  K 3.1* 3.1* 3.4* 3.5  CL 108 109 105 107  CO2 '24 27 26 27  ' GLUCOSE 113* 97 116* 116*  BUN <5* <5* <5* <5*  CALCIUM 8.8* 8.2* 8.1* 8.0*  CREATININE 0.65 0.63 0.54 0.59  GFRNONAA >60 >60 >60 >60  GFRAA >60 >60 >60 >60    LIVER FUNCTION TESTS:  Recent Labs  04/11/16 2241 04/13/16 0326 04/14/16 0219 04/15/16 0425  BILITOT 0.5 0.7 1.5* 1.0  AST 19 36 53* 26  ALT 69* 57* 77* 59*  ALKPHOS 76 63 70 72  PROT  5.7* 4.9* 5.2* 5.1*  ALBUMIN 3.4* 2.8* 2.7* 2.5*    TUMOR MARKERS: No results for input(s): AFPTM, CEA, CA199, CHROMGRNA in the last 8760 hours.  Assessment and Plan: S/p lap chole S/p ERCP/sphincterotomy Not convinced she still has a leak, would expect to see activity on HIDA along the course of her drain. Output seems to be trending down and not bilious in appearance. Will send fluid for Bili level. Will d/w surgery as would favor repeat CT or MRCP to see if there is fluid collection/biloma in the area suggested on HIDA.  Thank you for this interesting consult.  I greatly enjoyed meeting Laurie Terry and look forward to participating in their care.  A copy of this report was sent to the requesting provider on this date.  Electronically Signed: Ascencion Dike 04/15/2016, 5:08 PM   I spent a total of 20 minutes in face to face in clinical consultation, greater than 50% of which was counseling/coordinating care for bile leak

## 2016-04-15 NOTE — Anesthesia Postprocedure Evaluation (Signed)
Anesthesia Post Note  Patient: Laurie Terry  Procedure(s) Performed: Procedure(s) (LRB): LAPAROSCOPIC CHOLECYSTECTOMY (N/A)  Patient location during evaluation: PACU Anesthesia Type: General Level of consciousness: awake and alert and patient cooperative Pain management: pain level controlled Vital Signs Assessment: post-procedure vital signs reviewed and stable Respiratory status: spontaneous breathing and respiratory function stable Cardiovascular status: stable Anesthetic complications: no    Last Vitals:  Vitals:   04/11/16 0459 04/11/16 1033  BP: (!) 97/46 (!) 98/57  Pulse: 60 64  Resp: 19 16  Temp: 37.1 C 37.1 C    Last Pain:  Vitals:   04/11/16 1033  TempSrc: Oral  PainSc:                  [name]

## 2016-04-15 NOTE — Progress Notes (Signed)
PROGRESS NOTE  Laurie Terry [id number] DOB: [birth year] DOA: 04/12/2016 PCP: Antony Blackbird, MD  Brief History: 50 year old female with a remote history of melanoma and cervical dysplasia presented with increasing abdominal pain associated with increased drainage and change of character of drainage from her JP drain. The patient was recently hospitalized from 04/06/2016 through 04/11/2016 for biliary pancreatitis for which she had a laparoscopic cholecystectomy on 04/09/2016. She was discharged in stable condition after she tolerated a soft diet. Unfortunately, she began developing increasing epigastric pain after eating at home. She subsequently ate the second half of her meal with increasing pain. Because of the changes in her JP drainage and abdominal pain she presented to the emergency department. CT of the abdomen and pelvis on 04/12/2016 revealed no unexpected findings status post cholecystectomy. There was expected fluid in the operative bed, and the surgical drain was in the subhepatic space. GI and general surgery were consulted. 04/12/16 ERCP with sphincterotomy was performed by Dr. Watt Climes, but a stent was unable to be placed. The patient was admitted for continued observation post ERCP and concerns for biliary leak. The patient continued to have intermittent abdominal pain with continued significant JP output.  04/15/2016 HIDA scan confirmed bile leak.  Assessment/Plan: Postoperative biliary leak status post laparoscopic cholecystectomy -Appreciate GI and general surgery followup -control pain -monitor JP drainage--140cc in past 24 hours -continue IVF -04/12/2016 CT abdomen and pelvis no unexpected finding status post laparoscopic cholecystectomy. Expect fluid seen in the operative bed with a surgical drain in the subhepatic space -04/12/2016 ERCP with sphincterotomy --Dr. Watt Climes -04/15/16 --HIDA--continued bile leak-->consult IR for percutaneous drain  Fever -UA  unrevealing -continue imipenem D#3 -follow blood culture--neg to date -hemodynamically stable  Hypokalemia  -Repleted  -Check magnesium--1.8  Paroxysmal supraventricular tachycardia  -Presently in sinus rhythm  -TSH 0.457, free T4 0.97  -04/11/2016 echo EF 55-60%, no WMA  Constipation -colace and senna    Disposition Plan: Home in 2-3 days  Family Communication: Husband updated at bedside 7/24  Consultants: GI--[facility]  Code Status: FULL  DVT Prophylaxis: SCDs   Procedures: As Listed in Progress Note Above  Antibiotics: Imipenem 04/13/16>>>     Subjective: Patient continues to complain of intermittent right upper quadrant epigastric pain. Has some nausea without emesis. Denies any fevers, chills, chest pain, shortness breath, coughing, hemoptysis, diarrhea, hematochezia, melena. No dysuria or hematuria.  Objective: Vitals:   04/14/16 1350 04/14/16 2157 04/15/16 0552 04/15/16 1441  BP: 107/72 (!) 108/50 (!) 98/56 (!) 98/43  Pulse: 89 97 93 88  Resp:  17 16 16   Temp: 99.5 F (37.5 C) 99.5 F (37.5 C) 99.7 F (37.6 C)   TempSrc: Oral Oral Oral Oral  SpO2: 96% 96% 94% 97%    Intake/Output Summary (Last 24 hours) at 04/15/16 1623 Last data filed at 04/15/16 1431  Gross per 24 hour  Intake             1590 ml  Output             3070 ml  Net            -1480 ml   Weight change:  Exam:   General:  Pt is alert, follows commands appropriately, not in acute distress  HEENT: No icterus, No thrush, No neck mass, Walker/AT  Cardiovascular: RRR, S1/S2, no rubs, no gallops  Respiratory: CTA bilaterally, no wheezing, no crackles, no rhonchi  Abdomen: Soft/+BS, RUQ and epigastric  tender without rebound, non distended, no guarding  Extremities: No edema, No lymphangitis, No petechiae, No rashes, no synovitis   Data Reviewed: I have personally reviewed following labs and imaging studies Basic  Metabolic Panel:  Recent Labs Lab 04/10/16 0555 04/11/16 2241 04/12/16 0458 04/13/16 0326 04/14/16 0219 04/15/16 0425  NA 137 137  --  142 136 138  K 4.0 3.1*  --  3.1* 3.4* 3.5  CL 106 108  --  109 105 107  CO2 23 24  --  27 26 27   GLUCOSE 104* 113*  --  97 116* 116*  BUN <5* <5*  --  <5* <5* <5*  CREATININE 0.54 0.65  --  0.63 0.54 0.59  CALCIUM 8.3* 8.8*  --  8.2* 8.1* 8.0*  MG  --   --  1.8  --  1.8  --   PHOS  --   --  3.2  --   --   --    Liver Function Tests:  Recent Labs Lab 04/10/16 0555 04/11/16 2241 04/13/16 0326 04/14/16 0219 04/15/16 0425  AST 27 19 36 53* 26  ALT 101* 69* 57* 77* 59*  ALKPHOS 84 76 63 70 72  BILITOT 1.2 0.5 0.7 1.5* 1.0  PROT 5.2* 5.7* 4.9* 5.2* 5.1*  ALBUMIN 3.1* 3.4* 2.8* 2.7* 2.5*    Recent Labs Lab 04/09/16 0505 04/11/16 2241 04/13/16 0326  LIPASE 65* 33 45   No results for input(s): AMMONIA in the last 168 hours. Coagulation Profile: No results for input(s): INR, PROTIME in the last 168 hours. CBC:  Recent Labs Lab 04/11/16 2241 04/12/16 1429 04/13/16 0326 04/14/16 0219 04/15/16 0425  WBC 7.9 8.0 9.2 11.5* 11.9*  NEUTROABS  --  7.1  --   --  9.8*  HGB 12.1 12.3 11.1* 11.6* 11.2*  HCT 36.9 37.8 33.5* 36.6 35.0*  MCV 88.5 89.8 89.1 90.4 89.7  PLT 249 228 224 256 249   Cardiac Enzymes:  Recent Labs Lab 04/12/16 0458  TROPONINI <0.03   BNP: Invalid input(s): POCBNP CBG: No results for input(s): GLUCAP in the last 168 hours. HbA1C: No results for input(s): HGBA1C in the last 72 hours. Urine analysis:    Component Value Date/Time   COLORURINE YELLOW 04/13/2016 [place] 04/13/2016 1252   LABSPEC 1.010 04/13/2016 1252   PHURINE 7.5 04/13/2016 1252   GLUCOSEU NEGATIVE 04/13/2016 1252   HGBUR NEGATIVE 04/13/2016 1252   BILIRUBINUR NEGATIVE 04/13/2016 1252   KETONESUR 15 (A) 04/13/2016 1252   PROTEINUR NEGATIVE 04/13/2016 1252   UROBILINOGEN 0.2 03/30/2015 0852   NITRITE NEGATIVE  04/13/2016 1252   LEUKOCYTESUR NEGATIVE 04/13/2016 1252   Sepsis Labs: @LABRCNTIP ([id number]) ) Recent Results (from the past 240 hour(s))  Urine culture     Status: Abnormal   Collection Time: 04/13/16 12:52 PM  Result Value Ref Range Status   Specimen Description URINE, CLEAN CATCH  Final   Special Requests NONE  Final   Culture MULTIPLE SPECIES PRESENT, SUGGEST RECOLLECTION (A)  Final   Report Status 04/14/2016 FINAL  Final  MRSA PCR Screening     Status: None   Collection Time: 04/13/16  1:15 PM  Result Value Ref Range Status   MRSA by PCR NEGATIVE NEGATIVE Final    Comment:        The GeneXpert MRSA Assay (FDA approved for NASAL specimens only), is one component of a comprehensive MRSA colonization surveillance program. It is not intended to diagnose MRSA infection nor to guide or  monitor treatment for MRSA infections.   Culture, blood (Routine X 2) w Reflex to ID Panel     Status: None (Preliminary result)   Collection Time: 04/13/16  2:05 PM  Result Value Ref Range Status   Specimen Description BLOOD LEFT ASSIST CONTROL  Final   Special Requests BOTTLES DRAWN AEROBIC AND ANAEROBIC 5CC  Final   Culture NO GROWTH 2 DAYS  Final   Report Status PENDING  Incomplete  Culture, blood (Routine X 2) w Reflex to ID Panel     Status: None (Preliminary result)   Collection Time: 04/13/16  2:11 PM  Result Value Ref Range Status   Specimen Description BLOOD LEFT ANTECUBITAL  Final   Special Requests BOTTLES DRAWN AEROBIC AND ANAEROBIC 5CC  Final   Culture NO GROWTH 2 DAYS  Final   Report Status PENDING  Incomplete     Scheduled Meds: . docusate sodium  100 mg Oral Daily  . enoxaparin (LOVENOX) injection  40 mg Subcutaneous Q24H  . famotidine (PEPCID) IV  20 mg Intravenous Q12H  . imipenem-cilastatin  500 mg Intravenous Q6H  . potassium citrate  10 mEq Oral TID WC   Continuous Infusions: . sodium chloride 100 mL/hr at 04/15/16 1449  . dextrose 5 % and  0.9 % NaCl with KCl 20 mEq/L 75 mL/hr at 04/14/16 2145  . lactated ringers Stopped (04/12/16 1231)    Procedures/Studies: Ct Abdomen Pelvis Wo Contrast  Result Date: 04/12/2016 CLINICAL DATA:  Right-sided abdominal pain.  Recent cholecystectomy. EXAM: CT ABDOMEN AND PELVIS WITHOUT CONTRAST TECHNIQUE: Multidetector CT imaging of the abdomen and pelvis was performed following the standard protocol without IV contrast. COMPARISON:  04/08/2016 MRI FINDINGS: Lower chest and abdominal wall: Changes of recent laparoscopic surgery. No signs of abscess. Trace pleural effusions, greater on the right, with minimal atelectasis. Hepatobiliary: Recent cholecystectomy with expected fluid and stranding in the operative bed. There is a surgical drain in the subhepatic space. No concerning ascites. No visible choledocholithiasis. No biliary distention. Pancreas: Unremarkable. Spleen: Unremarkable. Adrenals/Urinary Tract: Negative adrenals. No hydronephrosis or stone. Unremarkable bladder. Stomach/Bowel:  No obstruction. No appendicitis. Reproductive:IUD in place. Vascular/Lymphatic: No acute vascular abnormality. No mass or adenopathy. Other: Trace pelvic fluid. No perihepatic ascites. Small volume residual pneumoperitoneum. Musculoskeletal: Expansile sacral Tarlov cysts on the left at S2, incidental in this setting. IMPRESSION: 1. No unexpected finding after recent cholecystectomy. 2. Trace pleural effusions and mild basilar atelectasis. Electronically Signed   By: Monte Fantasia M.D.   On: 04/12/2016 04:00   Dg Chest 2 View  Result Date: 04/06/2016 CLINICAL DATA:  50 year old female with chest pain EXAM: CHEST  2 VIEW COMPARISON:  Chest radiograph dated 03/29/2015 FINDINGS: The heart size and mediastinal contours are within normal limits. Both lungs are clear. The visualized skeletal structures are unremarkable. IMPRESSION: No active cardiopulmonary disease. Electronically Signed   By: Anner Crete M.D.   On:  04/06/2016 22:16   Nm Hepatobiliary Liver Func  Result Date: 04/15/2016 CLINICAL DATA:  Postoperative bile leak. EXAM: NUCLEAR MEDICINE HEPATOBILIARY IMAGING TECHNIQUE: Sequential images of the abdomen were obtained out to 60 minutes following intravenous administration of radiopharmaceutical. RADIOPHARMACEUTICALS:  5.2 mCi Tc-36m  Choletec IV COMPARISON:  Abdominal CT scan dated 04/12/2016 and abdominal MRI dated 04/08/2016 FINDINGS: Prompt uptake and biliary excretion of activity by the liver is seen. There is immediate leak each of the activity along the inferior aspect of the left lobe of the liver just above the distal stomach. Majority of the activity is  excreted into the bowel. IMPRESSION: Bile leak extending along the undersurface of the left lobe of the liver. Electronically Signed   By: Lorriane Shire M.D.   On: 04/15/2016 14:31  US Abdomen Complete  Result Date: 04/06/2016 CLINICAL DATA:  Right back pain and epigastric pain with nausea for 8 months. History of melanoma. EXAM: ABDOMEN ULTRASOUND COMPLETE COMPARISON:  None. FINDINGS: Gallbladder: Several stones in the gallbladder, largest measuring 1.9 cm diameter. Small amount of sludge layering in the gallbladder. No gallbladder wall thickening. Murphy's sign is negative. Common bile duct: Diameter: 8 mm.  Upper limits of normal range. Liver: No focal lesion identified. Within normal limits in parenchymal echogenicity. IVC: No abnormality visualized. Pancreas: Visualized portion unremarkable. Spleen: Size and appearance within normal limits. Right Kidney: Length: 10.9 cm. Echogenicity within normal limits. No mass or hydronephrosis visualized. Left Kidney: Length: 10.8 cm. Echogenicity within normal limits. No mass or hydronephrosis visualized. Abdominal aorta: No aneurysm visualized. Other findings: None. IMPRESSION: Cholelithiasis and gallbladder sludge. No additional changes to suggest cholecystitis. Extrahepatic bile duct measures 8 mm  diameter, representing upper limits of normal range. Electronically Signed   By: Lucienne Capers M.D.   On: 04/06/2016 23:35   Mr Abdomen Mrcp Wo Cm  Result Date: 04/08/2016 CLINICAL DATA:  Gallstone pancreatitis. Past medical history of melanoma 15 years ago. Epigastric abdominal pain. Elevated lipase and liver function tests. EXAM: MRI ABDOMEN WITHOUT CONTRAST  (INCLUDING MRCP) TECHNIQUE: Multiplanar multisequence MR imaging of the abdomen was performed. Heavily T2-weighted images of the biliary and pancreatic ducts were obtained, and three-dimensional MRCP images were rendered by post processing. COMPARISON:  04/06/2016 abdominal ultrasound. FINDINGS: Mild degradation secondary to motion and less so lack of IV contrast. Lower chest: Trace right larger than left pleural effusions. Normal heart size. Hepatobiliary: No focal liver lesion. No intrahepatic ductal dilatation. Gallstones including at 16 mm. Moderate-to-marked gallbladder wall thickening, including at up to 12 mm on image 22/series 4. No common duct dilatation.  No choledocholithiasis. Pancreas: No pancreatic duct dilatation or dominant mass. There is nonspecific edema within the anterior pararenal space, including adjacent the tail on image 19/series 8. No peripancreatic fluid collection. Spleen: Normal in size, without focal abnormality. Adrenals/Urinary Tract: Normal adrenal glands. Normal kidneys, without hydronephrosis. Stomach/Bowel: Normal stomach and abdominal bowel loops. Vascular/Lymphatic: Normal caliber of the aorta and branch vessels. No retroperitoneal or retrocrural adenopathy. Other: Small volume perihepatic and perisplenic ascites. Musculoskeletal: No acute osseous abnormality. IMPRESSION: 1. Mild degradation secondary to motion and lack of IV contrast. 2. No evidence of biliary duct dilatation or choledocholithiasis. 3. Cholelithiasis with gallbladder wall thickening. This wall thickening is new since 04/06/2016 ultrasound. If  acute cholecystitis is a concern, consider repeat ultrasound or nuclear medicine hepatobiliary study. 4. No specific findings of pancreatitis or its complications. There is minimal ascites with nonspecific edema throughout the anterior pararenal space. 5. Small bilateral pleural effusions. Electronically Signed   By: Abigail Miyamoto M.D.   On: 04/08/2016 15:46   Dg Ercp Biliary & Pancreatic Ducts  Result Date: 04/12/2016 CLINICAL DATA:  50 year old female undergoing ERCP. History of recent cholecystectomy. EXAM: ERCP TECHNIQUE: Multiple spot images obtained with the fluoroscopic device and submitted for interpretation post-procedure. FLUOROSCOPY TIME:  If the device does not provide the exposure index: Fluoroscopy Time:  9 minutes 40 seconds reported COMPARISON:  CT abdomen/pelvis 04/12/2016; abdominal ultrasound 04/12/2016 FINDINGS: Two intraoperative spot images demonstrate a flexible endoscope in the descending duodenum with cannulation of the common bile duct. Partial opacification of the  biliary tree is also noted. A short pigtail drain is present in the pancreatic duct. IMPRESSION: ERCP and pancreatic duct placement. These images were submitted for radiologic interpretation only. Please see the procedural report for the amount of contrast and the fluoroscopy time utilized. Electronically Signed   By: Jacqulynn Cadet M.D.   On: 04/12/2016 13:42   Dg Abd Portable 1v  Result Date: 04/14/2016 CLINICAL DATA:  50 year old female with bloating and abdominal pain. EXAM: PORTABLE ABDOMEN - 1 VIEW COMPARISON:  CT dated 04/12/2016 FINDINGS: Right upper quadrant cholecystectomy clips and a drainage catheter noted. A foreign object with rounded tip in the right upper quadrant at the level of the L2-L3 disc space may represent a stent or a foreign object overlying the patient. This was not seen on the prior CT. Clinical correlation is recommended. There is air throughout the colon. No small bowel dilatation or  evidence of obstruction. No free air identified. No radiopaque calculi. Mild degenerative changes of spine. No acute fracture. IMPRESSION: No evidence of obstruction. Electronically Signed   By: Anner Crete M.D.   On: 04/14/2016 22:25  US Abdomen Limited Ruq  Result Date: 04/12/2016 CLINICAL DATA:  Postoperative pain. Diagnosed with cholelithiasis and pancreatitis 4 days ago, now with severe abdominal pain. EXAM: US ABDOMEN LIMITED - RIGHT UPPER QUADRANT COMPARISON:  CT abdomen and pelvis April 12, 2016 at 0345 hours FINDINGS: Gallbladder: Status post cholecystectomy without fluid collection in the gallbladder fossa. Common bile duct: Diameter: 2 mm Liver: No focal lesion identified. Within normal limits in parenchymal echogenicity. Hepatopetal portal vein. Small RIGHT pleural effusion as seen on today CT. IMPRESSION: Status post cholecystectomy.  No acute RIGHT upper quadrant process. Electronically Signed   By: Elon Alas M.D.   On: 04/12/2016 04:53    Nyelah Emmerich, DO  [facility] Pager [phone]  If 7PM-7AM, please contact night-coverage [email or web address] Password [email or web address] 04/15/2016, 4:23 PM   LOS: 2 days

## 2016-04-15 NOTE — Progress Notes (Signed)
Patient ID: Laurie Terry, female   DOB: 1966/09/22, 50 y.o.   MRN: [id number]  Discussed her biliary anatomy further with Dr. Watt Climes. Hold off on IR consult for stent placement since CBD not dilated. Will readdress timing of a repeat ERCP with patient tomorrow. Dr. Carles Collet aware of recs. Will review HIDA scan with radiology. NPO p MN in case ERCP done Tuesday.

## 2016-04-15 NOTE — Progress Notes (Signed)
3 Days Post-Op  Subjective: Patient has new colicky RUQ pain that was 10/10 last night. Also has separate substernal pain that has occurred since surgery. Nauseated after eating. No flatus. No BM. VSS. WBC 11.9. JP output 940 mL. Currently afebrile.   Objective: Vital signs in last 24 hours: Temp:  [99.5 F (37.5 C)-99.7 F (37.6 C)] 99.7 F (37.6 C) (07/24 0552) Pulse Rate:  [89-97] 93 (07/24 0552) Resp:  [16-17] 16 (07/24 0552) BP: (98-108)/(50-72) 98/56 (07/24 0552) SpO2:  [94 %-96 %] 94 % (07/24 0552) Last BM Date: 04/06/16  Intake/Output from previous day: 07/23 0701 - 07/24 0700 In: 1230 [P.O.:180; I.V.:900; IV Piggyback:150] Out: 2290 [Urine:1350; Drains:940] Intake/Output this shift: No intake/output data recorded.  General appearance  Supine. Awake and talkative.  Head: Normocephalic/atraumatic. PERRL bilaterally. Extraocular eye muscles in tact.  Neck: FROM Cardiovascular: Regular rate and rhythm without gallops, murmurs or rubs Respiratory: Lung sounds vesicular. No wheezes, crackles or rhonchi auscultated Abdomen: Soft and non tender without guarding, masses, or rigidity. +BS  Lab Results:   Recent Labs  04/14/16 0219 04/15/16 0425  WBC 11.5* 11.9*  HGB 11.6* 11.2*  HCT 36.6 35.0*  PLT 256 249   BMET  Recent Labs  04/14/16 0219 04/15/16 0425  NA 136 138  K 3.4* 3.5  CL 105 107  CO2 26 27  GLUCOSE 116* 116*  BUN <5* <5*  CREATININE 0.54 0.59  CALCIUM 8.1* 8.0*   PT/INR No results for input(s): LABPROT, INR in the last 72 hours. ABG No results for input(s): PHART, HCO3 in the last 72 hours.  Invalid input(s): PCO2, PO2  Studies/Results: Dg Abd Portable 1v  Result Date: 04/14/2016 CLINICAL DATA:  50 year old female with bloating and abdominal pain. EXAM: PORTABLE ABDOMEN - 1 VIEW COMPARISON:  CT dated 04/12/2016 FINDINGS: Right upper quadrant cholecystectomy clips and a drainage catheter noted. A foreign object with rounded tip in the right  upper quadrant at the level of the L2-L3 disc space may represent a stent or a foreign object overlying the patient. This was not seen on the prior CT. Clinical correlation is recommended. There is air throughout the colon. No small bowel dilatation or evidence of obstruction. No free air identified. No radiopaque calculi. Mild degenerative changes of spine. No acute fracture. IMPRESSION: No evidence of obstruction. Electronically Signed   By: Anner Crete M.D.   On: 04/14/2016 22:25   Anti-infectives: Anti-infectives    Start     Dose/Rate Route Frequency Ordered Stop   04/13/16 1315  imipenem-cilastatin (PRIMAXIN) 500 mg in sodium chloride 0.9 % 100 mL IVPB     500 mg 200 mL/hr over 30 Minutes Intravenous Every 6 hours 04/13/16 1308        Assessment/Plan: Gallstone pancreatitis  S/p lap chole - 04/09/2016 -  By Dr. Donne Hazel Common bile duct leak  s/p Has JP in place - 940 cc recorded last 24 hours, serosanguinous  WBC - 11.9 - 04/15/2016 New 10/10 RUQ abdominal pain since last night  Potentially repeat imaging pending discussion with Dr. Georgette Dover  Hypokalemia - Resolved   FEN: Full liquids  VTE: Lovenox  ID: [email or web address]  Dispo: Repeat imaging vs. ERCP         Ellison Hughs Marvel Plan PASII 04/15/2016

## 2016-04-15 NOTE — Progress Notes (Signed)
Patient ID: Laurie Terry, female   DOB: 16-Sep-1966, 50 y.o.   MRN: [id number] [facility] Gastroenterology Progress Note  Laurie Terry 50 y.o. 06/09/1966   Subjective: Sitting in bedside chair. Reports having a lot of intermittent abdominal pain last night but denies any now. Serous fluid noted in drain.  Objective: Vital signs in last 24 hours: Vitals:   04/14/16 2157 04/15/16 0552  BP: (!) 108/50 (!) 98/56  Pulse: 97 93  Resp: 17 16  Temp: 99.5 F (37.5 C) 99.7 F (37.6 C)    Physical Exam: Gen: lethargic, no acute distress, thin HEENT: anicteric sclera CV: RRR Chest: CTA B Abd: diffuse tenderness with guarding, soft, mild distention, +BS, JP drain with small amount of serous fluid  Lab Results:  Recent Labs  04/14/16 0219 04/15/16 0425  NA 136 138  K 3.4* 3.5  CL 105 107  CO2 26 27  GLUCOSE 116* 116*  BUN <5* <5*  CREATININE 0.54 0.59  CALCIUM 8.1* 8.0*  MG 1.8  --     Recent Labs  04/14/16 0219 04/15/16 0425  AST 53* 26  ALT 77* 59*  ALKPHOS 70 72  BILITOT 1.5* 1.0  PROT 5.2* 5.1*  ALBUMIN 2.7* 2.5*    Recent Labs  04/12/16 1429  04/14/16 0219 04/15/16 0425  WBC 8.0  < > 11.5* 11.9*  NEUTROABS 7.1  --   --  9.8*  HGB 12.3  < > 11.6* 11.2*  HCT 37.8  < > 36.6 35.0*  MCV 89.8  < > 90.4 89.7  PLT 228  < > 256 249  < > = values in this interval not displayed. No results for input(s): LABPROT, INR in the last 72 hours.    Assessment/Plan: 50 yo with postop bile leak s/p ERCP with sphincterotomy but failed attempt at placing biliary stent. LFTs improving. Currently, serous drainage from JP drain. Hemodynamically stable. Low grade temps on broad-spectrum Abx. Continue supportive care. Hold off on repeat imaging at this time. If continues to do better, then may need drain capped off and see if pain recurs. Continue liquid diet. [facility] will follow.   Aurelia C. 04/15/2016, 10:38 AM  Pager [phone]  If no answer or after 5  PM call [phone]

## 2016-04-16 ENCOUNTER — Inpatient Hospital Stay (HOSPITAL_COMMUNITY): Payer: 59

## 2016-04-16 DIAGNOSIS — K851 Biliary acute pancreatitis without necrosis or infection: Secondary | ICD-10-CM

## 2016-04-16 LAB — COMPREHENSIVE METABOLIC PANEL
ALBUMIN: 2.4 g/dL — AB (ref 3.5–5.0)
ALT: 39 U/L (ref 14–54)
AST: 12 U/L — AB (ref 15–41)
Alkaline Phosphatase: 89 U/L (ref 38–126)
Anion gap: 6 (ref 5–15)
BUN: 6 mg/dL (ref 6–20)
CHLORIDE: 104 mmol/L (ref 101–111)
CO2: 27 mmol/L (ref 22–32)
CREATININE: 0.63 mg/dL (ref 0.44–1.00)
Calcium: 8 mg/dL — ABNORMAL LOW (ref 8.9–10.3)
GFR calc non Af Amer: >60 mL/min (ref >60)
Glucose, Bld: 86 mg/dL (ref 65–99)
Potassium: 3.7 mmol/L (ref 3.5–5.1)
SODIUM: 137 mmol/L (ref 135–145)
Total Bilirubin: 0.8 mg/dL (ref 0.3–1.2)
Total Protein: 4.9 g/dL — ABNORMAL LOW (ref 6.5–8.1)

## 2016-04-16 LAB — CBC
HCT: 34.1 % — ABNORMAL LOW (ref 36.0–46.0)
Hemoglobin: 10.8 g/dL — ABNORMAL LOW (ref 12.0–15.0)
MCH: 28.6 pg (ref 26.0–34.0)
MCHC: 31.7 g/dL (ref 30.0–36.0)
MCV: 90.5 fL (ref 78.0–100.0)
PLATELETS: 258 10*3/uL (ref 150–400)
RBC: 3.77 MIL/uL — AB (ref 3.87–5.11)
RDW: 14 % (ref 11.5–15.5)
WBC: 8.7 10*3/uL (ref 4.0–10.5)

## 2016-04-16 MED ORDER — DIPHENHYDRAMINE HCL 50 MG PO CAPS
ORAL_CAPSULE | ORAL | Status: AC
  Filled 2016-04-16: qty 1

## 2016-04-16 MED ORDER — GADOBENATE DIMEGLUMINE 529 MG/ML IV SOLN
15.0000 mL | Freq: Once | INTRAVENOUS | Status: AC | PRN
  Administered 2016-04-16: 15 mL via INTRAVENOUS

## 2016-04-16 MED ORDER — BISACODYL 10 MG RE SUPP
10.0000 mg | Freq: Once | RECTAL | Status: AC
  Administered 2016-04-16: 10 mg via RECTAL
  Filled 2016-04-16: qty 1

## 2016-04-16 NOTE — Progress Notes (Signed)
Patient ID: Laurie Terry, female   DOB: [birth year], 50 y.o.   MRN: [id number] [facility] Gastroenterology Progress Note  Laurie Terry 50 y.o. [birth year]   Subjective: Sitting in bedside chair. Nausea without vomiting. Focal abdominal pain in RUQ (separate area from percutaneous drain exit). Mother and sister in room.  Objective: Vital signs in last 24 hours: Vitals:   04/15/16 2137 04/16/16 0538  BP: (!) 89/50 (!) 97/46  Pulse: 71 67  Resp: 16   Temp: 98.7 F (37.1 C) 99.5 F (37.5 C)  50 cc from drain in last 24 hours  Physical Exam: Gen: alert, no acute distress HEENT: anicteric sclera CV: RRR Chest: CTA anteriorly Abd: focal tenderness in RUQ with guarding, epigastric tenderness with guarding, mild distention, small amount of serosanginous fluid in drain  Lab Results:  Recent Labs  04/14/16 0219 04/15/16 0425 04/16/16 0402  NA 136 138 137  K 3.4* 3.5 3.7  CL 105 107 104  CO2 26 27 27   GLUCOSE 116* 116* 86  BUN <5* <5* 6  CREATININE 0.54 0.59 0.63  CALCIUM 8.1* 8.0* 8.0*  MG 1.8  --   --     Recent Labs  04/15/16 0425 04/16/16 0402  AST 26 12*  ALT 59* 39  ALKPHOS 72 89  BILITOT 1.0 0.8  PROT 5.1* 4.9*  ALBUMIN 2.5* 2.4*    Recent Labs  04/15/16 0425 04/16/16 0402  WBC 11.9* 8.7  NEUTROABS 9.8*  --   HGB 11.2* 10.8*  HCT 35.0* 34.1*  MCV 89.7 90.5  PLT 249 258   No results for input(s): LABPROT, INR in the last 72 hours.    Assessment/Plan: Postop bile leak with HIDA NEGATIVE for active bile leak and serosanginous fluid in drain (TB from drain pending) for last 2 days. Continue IV Abx, supportive care. MRCP planned. Clear liquid diet ok after MRCP. Hold off on repeat ERCP. Will follow.   [place] C. 04/16/2016, 10:18 AM  Pager [phone]  If no answer or after 5 PM call [phone]

## 2016-04-16 NOTE — Progress Notes (Signed)
[facility] Progress Note  4 Days Post-Op  Subjective: NAD. Nausea decreased and tolerated full liquids yesterday. Persistent, localized RUQ pain, worse with movement and deep inspiration. Described as sharp, and "different from pain over surgical incision sites". Patient unsure if pain is better or worse compared to yesterday. Reports no bile in drain overnight, just "yellow and red fluid".   Objective: Vital signs in last 24 hours: Temp:  [98.7 F (37.1 C)-99.5 F (37.5 C)] 99.5 F (37.5 C) (07/25 0538) Pulse Rate:  [67-88] 67 (07/25 0538) Resp:  [16] 16 (07/24 2137) BP: (89-98)/(43-50) 97/46 (07/25 0538) SpO2:  [96 %-97 %] 96 % (07/25 0538) Last BM Date: 04/06/16  Intake/Output from previous day: 07/24 0701 - 07/25 0700 In: 600 [P.O.:600] Out: 1500 [Urine:1450; Drains:50] Intake/Output this shift: No intake/output data recorded.  PE: Gen:  Alert, NAD, pleasant Card:  RRR, no M/G/R appreciated Pulm:  CTA, no W/R/R Abd: Soft, TTP RUQ at costal margin, ND, +BS, no HSM, incisions C/D/I, drain with minimal sero-sanguinous drainage Ext:  No erythema, edema, or tenderness   Lab Results:   Recent Labs  04/15/16 0425 04/16/16 0402  WBC 11.9* 8.7  HGB 11.2* 10.8*  HCT 35.0* 34.1*  PLT 249 258   BMET  Recent Labs  04/15/16 0425 04/16/16 0402  NA 138 137  K 3.5 3.7  CL 107 104  CO2 27 27  GLUCOSE 116* 86  BUN <5* 6  CREATININE 0.59 0.63  CALCIUM 8.0* 8.0*   Hepatic Function Latest Ref Rng & Units 04/16/2016 04/15/2016 04/14/2016  Total Protein 6.5 - 8.1 g/dL 4.9(L) 5.1(L) 5.2(L)  Albumin 3.5 - 5.0 g/dL 2.4(L) 2.5(L) 2.7(L)  AST 15 - 41 U/L 12(L) 26 53(H)  ALT 14 - 54 U/L 39 59(H) 77(H)  Alk Phosphatase 38 - 126 U/L 89 72 70  Total Bilirubin 0.3 - 1.2 mg/dL 0.8 1.0 1.5(H)   Lipase     Component Value Date/Time   LIPASE 45 04/13/2016 0326   Studies/Results: Nm Hepatobiliary Liver Func  Addendum Date: 04/15/2016   ADDENDUM REPORT: 04/15/2016  17:27 ADDENDUM: Upon further analysis of the hepatobiliary scan, the activity seen along the inferior aspect of the left lobe of the liver may well represent reflux into the duodenal bulb and distal antrum of the stomach rather than a true bile leak. The activity slowly diminishes over the course of the exam while the activity in the C-loop of the duodenum increases. No activity is seen entering the drain in the gallbladder fossa. IMPRESSION: Indeterminate hepatobiliary scan. The activity along the inferior aspect of the left lobe of the liver most likely represents reflux into the stomach. Electronically Signed   By: Lorriane Shire M.D.   On: 04/15/2016 17:27  Result Date: 04/15/2016 CLINICAL DATA:  Postoperative bile leak. EXAM: NUCLEAR MEDICINE HEPATOBILIARY IMAGING TECHNIQUE: Sequential images of the abdomen were obtained out to 60 minutes following intravenous administration of radiopharmaceutical. RADIOPHARMACEUTICALS:  5.2 mCi Tc-59m Choletec IV COMPARISON:  Abdominal CT scan dated 04/12/2016 and abdominal MRI dated 04/08/2016 FINDINGS: Prompt uptake and biliary excretion of activity by the liver is seen. There is immediate leak each of the activity along the inferior aspect of the left lobe of the liver just above the distal stomach. Majority of the activity is excreted into the bowel. IMPRESSION: Bile leak extending along the undersurface of the left lobe of the liver. Electronically Signed: By: JLorriane ShireM.D. On: 04/15/2016 14:31  Dg Abd Portable 1v  Result Date:  04/14/2016 CLINICAL DATA:  50 year old female with bloating and abdominal pain. EXAM: PORTABLE ABDOMEN - 1 VIEW COMPARISON:  CT dated 04/12/2016 FINDINGS: Right upper quadrant cholecystectomy clips and a drainage catheter noted. A foreign object with rounded tip in the right upper quadrant at the level of the L2-L3 disc space may represent a stent or a foreign object overlying the patient. This was not seen on the prior CT. Clinical  correlation is recommended. There is air throughout the colon. No small bowel dilatation or evidence of obstruction. No free air identified. No radiopaque calculi. Mild degenerative changes of spine. No acute fracture. IMPRESSION: No evidence of obstruction. Electronically Signed   By: Anner Crete M.D.   On: 04/14/2016 22:25   Anti-infectives: Anti-infectives    Start     Dose/Rate Route Frequency Ordered Stop   04/13/16 1315  imipenem-cilastatin (PRIMAXIN) 500 mg in sodium chloride 0.9 % 100 mL IVPB     500 mg 200 mL/hr over 30 Minutes Intravenous Every 6 hours 04/13/16 1308       Assessment/Plan Gallstone pancreatitis  S/p lap chole - 04/09/2016 -  By Dr. Donne Hazel Postoperative cystic duct leak  Has RUQ JP in place - 1500cc/24 hours, serosanguinous  New 10/10 RUQ abdominal pain since 7/23 - HIDA negative for continued bile leak - MRCP or CT scan to r/o biloma/obstruction of CBD vs. Watch and wait with pain management; patient has an allergy to iodine causing respiratory distress. Will confirm that contrast for MRI is non-iodine containing. - CMP ordered for tomorrow  Hypokalemia - Resolved   FEN: resume full liquids VTE: Lovenox  ID: Primaxin   Plan: MRCP to r/o biloma/CBD obstruction   LOS: 3 days    Jill Alexanders , [facility] 04/16/2016, 7:42 AM Pager: [phone] Consults: [phone] Mon-Fri 7:00 am-4:30 pm Sat-Sun 7:00 am-11:30 am

## 2016-04-16 NOTE — Progress Notes (Signed)
PROGRESS NOTE  Laurie Terry [id number] DOB: [birth year] DOA: 04/12/2016 PCP: Antony Blackbird, MD  Brief History: 50 year old female with a remote history of melanoma and cervical dysplasia presented with increasing abdominal pain associated with increased drainage and change of character of drainage from her JP drain. The patient was recently hospitalized from 04/06/2016 through 04/11/2016 for biliary pancreatitis for which she had a laparoscopic cholecystectomy on 04/09/2016. She was discharged in stable condition after she tolerated a soft diet. Unfortunately, she began developing increasing epigastric pain after eating at home. She subsequently ate the second half of her meal with increasing pain. Because of the changes in her JP drainage and abdominal pain she presented to the emergency department. CT of the abdomen and pelvis on 04/12/2016 revealed no unexpected findings status post cholecystectomy. There was expected fluid in the operative bed, and the surgical drain was in the subhepatic space. GI and general surgery were consulted. 04/12/16 ERCP with sphincterotomy was performed by Dr. Watt Climes, but a stent was unable to be placed. The patient was admitted for continued observation post ERCP and concerns for biliary leak. The patient continued to have intermittent abdominal pain with continued significant JP output.  04/15/2016 HIDA scan confirmed bile leak.  Assessment/Plan: Postoperative biliary leak status post laparoscopic cholecystectomy -Appreciate GI and general surgery followup -control pain -monitor JP drainage--140cc in past 24 hours -continue IVF -04/12/2016 CT abdomen and pelvis no unexpected finding status post laparoscopic cholecystectomy. Expect fluid seen in the operative bed with a surgical drain in the subhepatic space -04/12/2016 ERCP with sphincterotomy --Dr. Watt Climes -04/15/16 --HIDA--NEG  bile leak upon second review-->MRCP ordered r/o biloma  Fever -UA  unrevealing -resolved after starting imipenem -continue imipenem D#4 -follow blood culture--neg to date -hemodynamically stable  Hypokalemia  -Repleted -Check magnesium--1.8  Paroxysmal supraventricular tachycardia  -Presently in sinus rhythm  -TSH 0.457, free T4 0.97  -04/11/2016 echo EF 55-60%, no WMA  Constipation -colace and senna -add bisacodyl    Disposition Plan: Home in 2-3 days  Family Communication: Motherupdated at bedside 7/25  Consultants: GI--[facility]  Code Status: FULL  DVT Prophylaxis: SCDs   Procedures: As Listed in Progress Note Above  Antibiotics: Imipenem 04/13/16>>>   Subjective: Patient complains of pinpoint tenderness in the right upper quadrant. She feels hungry today. Denies any fevers, chills, headache, chest pain, shortness breath, nausea, vomiting, diarrhea, dysuria, hematuria, hematochezia, melena.  Objective: Vitals:   04/15/16 1441 04/15/16 2137 04/16/16 0538 04/16/16 1415  BP: (!) 98/43 (!) 89/50 (!) 97/46 (!) 94/45  Pulse: 88 71 67 73  Resp: 16 16  18   Temp:  98.7 F (37.1 C) 99.5 F (37.5 C) 98.4 F (36.9 C)  TempSrc: Oral Oral Oral Oral  SpO2: 97% 96% 96% 100%    Intake/Output Summary (Last 24 hours) at 04/16/16 1757 Last data filed at 04/16/16 1719  Gross per 24 hour  Intake              240 ml  Output             3045 ml  Net            -2805 ml   Weight change:  Exam:   General:  Pt is alert, follows commands appropriately, not in acute distress  HEENT: No icterus, No thrush, No neck mass, [place]/AT  Cardiovascular: RRR, S1/S2, no rubs, no gallops  Respiratory: CTA bilaterally, no wheezing, no crackles, no rhonchi  Abdomen:  Soft/+BS, RUQ tender without rebound, non distended, no guarding  Extremities: No edema, No lymphangitis, No petechiae, No rashes, no synovitis   Data Reviewed: I have personally reviewed following labs and imaging  studies Basic Metabolic Panel:  Recent Labs Lab 04/11/16 2241 04/12/16 0458 04/13/16 0326 04/14/16 0219 04/15/16 0425 04/16/16 0402  NA 137  --  142 136 138 137  K 3.1*  --  3.1* 3.4* 3.5 3.7  CL 108  --  109 105 107 104  CO2 24  --  27 26 27 27   GLUCOSE 113*  --  97 116* 116* 86  BUN <5*  --  <5* <5* <5* 6  CREATININE 0.65  --  0.63 0.54 0.59 0.63  CALCIUM 8.8*  --  8.2* 8.1* 8.0* 8.0*  MG  --  1.8  --  1.8  --   --   PHOS  --  3.2  --   --   --   --    Liver Function Tests:  Recent Labs Lab 04/11/16 2241 04/13/16 0326 04/14/16 0219 04/15/16 0425 04/16/16 0402  AST 19 36 53* 26 12*  ALT 69* 57* 77* 59* 39  ALKPHOS 76 63 70 72 89  BILITOT 0.5 0.7 1.5* 1.0 0.8  PROT 5.7* 4.9* 5.2* 5.1* 4.9*  ALBUMIN 3.4* 2.8* 2.7* 2.5* 2.4*    Recent Labs Lab 04/11/16 2241 04/13/16 0326  LIPASE 33 45   No results for input(s): AMMONIA in the last 168 hours. Coagulation Profile: No results for input(s): INR, PROTIME in the last 168 hours. CBC:  Recent Labs Lab 04/12/16 1429 04/13/16 0326 04/14/16 0219 04/15/16 0425 04/16/16 0402  WBC 8.0 9.2 11.5* 11.9* 8.7  NEUTROABS 7.1  --   --  9.8*  --   HGB 12.3 11.1* 11.6* 11.2* 10.8*  HCT 37.8 33.5* 36.6 35.0* 34.1*  MCV 89.8 89.1 90.4 89.7 90.5  PLT 228 224 256 249 258   Cardiac Enzymes:  Recent Labs Lab 04/12/16 0458  TROPONINI <0.03   BNP: Invalid input(s): POCBNP CBG: No results for input(s): GLUCAP in the last 168 hours. HbA1C: No results for input(s): HGBA1C in the last 72 hours. Urine analysis:    Component Value Date/Time   COLORURINE YELLOW 04/13/2016 [place] 04/13/2016 1252   LABSPEC 1.010 04/13/2016 1252   PHURINE 7.5 04/13/2016 1252   GLUCOSEU NEGATIVE 04/13/2016 1252   HGBUR NEGATIVE 04/13/2016 1252   BILIRUBINUR NEGATIVE 04/13/2016 1252   KETONESUR 15 (A) 04/13/2016 1252   PROTEINUR NEGATIVE 04/13/2016 1252   UROBILINOGEN 0.2 03/30/2015 0852   NITRITE NEGATIVE 04/13/2016  1252   LEUKOCYTESUR NEGATIVE 04/13/2016 1252   Sepsis Labs: @LABRCNTIP (procalcitonin:4,lacticidven:4) ) Recent Results (from the past 240 hour(s))  Urine culture     Status: Abnormal   Collection Time: 04/13/16 12:52 PM  Result Value Ref Range Status   Specimen Description URINE, CLEAN CATCH  Final   Special Requests NONE  Final   Culture MULTIPLE SPECIES PRESENT, SUGGEST RECOLLECTION (A)  Final   Report Status 04/14/2016 FINAL  Final  MRSA PCR Screening     Status: None   Collection Time: 04/13/16  1:15 PM  Result Value Ref Range Status   MRSA by PCR NEGATIVE NEGATIVE Final    Comment:        The GeneXpert MRSA Assay (FDA approved for NASAL specimens only), is one component of a comprehensive MRSA colonization surveillance program. It is not intended to diagnose MRSA infection nor to guide or monitor  treatment for MRSA infections.   Culture, blood (Routine X 2) w Reflex to ID Panel     Status: None (Preliminary result)   Collection Time: 04/13/16  2:05 PM  Result Value Ref Range Status   Specimen Description BLOOD LEFT ASSIST CONTROL  Final   Special Requests BOTTLES DRAWN AEROBIC AND ANAEROBIC 5CC  Final   Culture NO GROWTH 3 DAYS  Final   Report Status PENDING  Incomplete  Culture, blood (Routine X 2) w Reflex to ID Panel     Status: None (Preliminary result)   Collection Time: 04/13/16  2:11 PM  Result Value Ref Range Status   Specimen Description BLOOD LEFT ANTECUBITAL  Final   Special Requests BOTTLES DRAWN AEROBIC AND ANAEROBIC 5CC  Final   Culture NO GROWTH 3 DAYS  Final   Report Status PENDING  Incomplete     Scheduled Meds: . docusate sodium  100 mg Oral BID  . enoxaparin (LOVENOX) injection  40 mg Subcutaneous Q24H  . famotidine (PEPCID) IV  20 mg Intravenous Q12H  . imipenem-cilastatin  500 mg Intravenous Q6H  . potassium citrate  10 mEq Oral TID WC  . senna  2 tablet Oral Daily   Continuous Infusions: . sodium chloride 100 mL/hr at 04/15/16 1449  .  dextrose 5 % and 0.9 % NaCl with KCl 20 mEq/L 75 mL/hr at 04/14/16 2145  . lactated ringers Stopped (04/12/16 1231)    Procedures/Studies: Ct Abdomen Pelvis Wo Contrast  Result Date: 04/12/2016 CLINICAL DATA:  Right-sided abdominal pain.  Recent cholecystectomy. EXAM: CT ABDOMEN AND PELVIS WITHOUT CONTRAST TECHNIQUE: Multidetector CT imaging of the abdomen and pelvis was performed following the standard protocol without IV contrast. COMPARISON:  04/08/2016 MRI FINDINGS: Lower chest and abdominal wall: Changes of recent laparoscopic surgery. No signs of abscess. Trace pleural effusions, greater on the right, with minimal atelectasis. Hepatobiliary: Recent cholecystectomy with expected fluid and stranding in the operative bed. There is a surgical drain in the subhepatic space. No concerning ascites. No visible choledocholithiasis. No biliary distention. Pancreas: Unremarkable. Spleen: Unremarkable. Adrenals/Urinary Tract: Negative adrenals. No hydronephrosis or stone. Unremarkable bladder. Stomach/Bowel:  No obstruction. No appendicitis. Reproductive:IUD in place. Vascular/Lymphatic: No acute vascular abnormality. No mass or adenopathy. Other: Trace pelvic fluid. No perihepatic ascites. Small volume residual pneumoperitoneum. Musculoskeletal: Expansile sacral Tarlov cysts on the left at S2, incidental in this setting. IMPRESSION: 1. No unexpected finding after recent cholecystectomy. 2. Trace pleural effusions and mild basilar atelectasis. Electronically Signed   By: Monte Fantasia M.D.   On: 04/12/2016 04:00   Dg Chest 2 View  Result Date: 04/06/2016 CLINICAL DATA:  50 year old female with chest pain EXAM: CHEST  2 VIEW COMPARISON:  Chest radiograph dated 03/29/2015 FINDINGS: The heart size and mediastinal contours are within normal limits. Both lungs are clear. The visualized skeletal structures are unremarkable. IMPRESSION: No active cardiopulmonary disease. Electronically Signed   By: Anner Crete  M.D.   On: 04/06/2016 22:16   Nm Hepatobiliary Liver Func  Addendum Date: 04/15/2016   ADDENDUM REPORT: 04/15/2016 17:27 ADDENDUM: Upon further analysis of the hepatobiliary scan, the activity seen along the inferior aspect of the left lobe of the liver may well represent reflux into the duodenal bulb and distal antrum of the stomach rather than a true bile leak. The activity slowly diminishes over the course of the exam while the activity in the C-loop of the duodenum increases. No activity is seen entering the drain in the gallbladder fossa. IMPRESSION: Indeterminate hepatobiliary scan.  The activity along the inferior aspect of the left lobe of the liver most likely represents reflux into the stomach. Electronically Signed   By: Lorriane Shire M.D.   On: 04/15/2016 17:27  Result Date: 04/15/2016 CLINICAL DATA:  Postoperative bile leak. EXAM: NUCLEAR MEDICINE HEPATOBILIARY IMAGING TECHNIQUE: Sequential images of the abdomen were obtained out to 60 minutes following intravenous administration of radiopharmaceutical. RADIOPHARMACEUTICALS:  5.2 mCi Tc-79m  Choletec IV COMPARISON:  Abdominal CT scan dated 04/12/2016 and abdominal MRI dated 04/08/2016 FINDINGS: Prompt uptake and biliary excretion of activity by the liver is seen. There is immediate leak each of the activity along the inferior aspect of the left lobe of the liver just above the distal stomach. Majority of the activity is excreted into the bowel. IMPRESSION: Bile leak extending along the undersurface of the left lobe of the liver. Electronically Signed: By: Lorriane Shire M.D. On: 04/15/2016 14:31  US Abdomen Complete  Result Date: 04/06/2016 CLINICAL DATA:  Right back pain and epigastric pain with nausea for 8 months. History of melanoma. EXAM: ABDOMEN ULTRASOUND COMPLETE COMPARISON:  None. FINDINGS: Gallbladder: Several stones in the gallbladder, largest measuring 1.9 cm diameter. Small amount of sludge layering in the gallbladder. No  gallbladder wall thickening. Murphy's sign is negative. Common bile duct: Diameter: 8 mm.  Upper limits of normal range. Liver: No focal lesion identified. Within normal limits in parenchymal echogenicity. IVC: No abnormality visualized. Pancreas: Visualized portion unremarkable. Spleen: Size and appearance within normal limits. Right Kidney: Length: 10.9 cm. Echogenicity within normal limits. No mass or hydronephrosis visualized. Left Kidney: Length: 10.8 cm. Echogenicity within normal limits. No mass or hydronephrosis visualized. Abdominal aorta: No aneurysm visualized. Other findings: None. IMPRESSION: Cholelithiasis and gallbladder sludge. No additional changes to suggest cholecystitis. Extrahepatic bile duct measures 8 mm diameter, representing upper limits of normal range. Electronically Signed   By: Lucienne Capers M.D.   On: 04/06/2016 23:35   Mr Abdomen Mrcp Wo Cm  Result Date: 04/08/2016 CLINICAL DATA:  Gallstone pancreatitis. Past medical history of melanoma 15 years ago. Epigastric abdominal pain. Elevated lipase and liver function tests. EXAM: MRI ABDOMEN WITHOUT CONTRAST  (INCLUDING MRCP) TECHNIQUE: Multiplanar multisequence MR imaging of the abdomen was performed. Heavily T2-weighted images of the biliary and pancreatic ducts were obtained, and three-dimensional MRCP images were rendered by post processing. COMPARISON:  04/06/2016 abdominal ultrasound. FINDINGS: Mild degradation secondary to motion and less so lack of IV contrast. Lower chest: Trace right larger than left pleural effusions. Normal heart size. Hepatobiliary: No focal liver lesion. No intrahepatic ductal dilatation. Gallstones including at 16 mm. Moderate-to-marked gallbladder wall thickening, including at up to 12 mm on image 22/series 4. No common duct dilatation.  No choledocholithiasis. Pancreas: No pancreatic duct dilatation or dominant mass. There is nonspecific edema within the anterior pararenal space, including adjacent  the tail on image 19/series 8. No peripancreatic fluid collection. Spleen: Normal in size, without focal abnormality. Adrenals/Urinary Tract: Normal adrenal glands. Normal kidneys, without hydronephrosis. Stomach/Bowel: Normal stomach and abdominal bowel loops. Vascular/Lymphatic: Normal caliber of the aorta and branch vessels. No retroperitoneal or retrocrural adenopathy. Other: Small volume perihepatic and perisplenic ascites. Musculoskeletal: No acute osseous abnormality. IMPRESSION: 1. Mild degradation secondary to motion and lack of IV contrast. 2. No evidence of biliary duct dilatation or choledocholithiasis. 3. Cholelithiasis with gallbladder wall thickening. This wall thickening is new since 04/06/2016 ultrasound. If acute cholecystitis is a concern, consider repeat ultrasound or nuclear medicine hepatobiliary study. 4. No specific findings of pancreatitis or  its complications. There is minimal ascites with nonspecific edema throughout the anterior pararenal space. 5. Small bilateral pleural effusions. Electronically Signed   By: Abigail Miyamoto M.D.   On: 04/08/2016 15:46   Dg Ercp Biliary & Pancreatic Ducts  Result Date: 04/12/2016 CLINICAL DATA:  50 year old female undergoing ERCP. History of recent cholecystectomy. EXAM: ERCP TECHNIQUE: Multiple spot images obtained with the fluoroscopic device and submitted for interpretation post-procedure. FLUOROSCOPY TIME:  If the device does not provide the exposure index: Fluoroscopy Time:  9 minutes 40 seconds reported COMPARISON:  CT abdomen/pelvis 04/12/2016; abdominal ultrasound 04/12/2016 FINDINGS: Two intraoperative spot images demonstrate a flexible endoscope in the descending duodenum with cannulation of the common bile duct. Partial opacification of the biliary tree is also noted. A short pigtail drain is present in the pancreatic duct. IMPRESSION: ERCP and pancreatic duct placement. These images were submitted for radiologic interpretation only. Please  see the procedural report for the amount of contrast and the fluoroscopy time utilized. Electronically Signed   By: Jacqulynn Cadet M.D.   On: 04/12/2016 13:42   Dg Abd Portable 1v  Result Date: 04/14/2016 CLINICAL DATA:  50 year old female with bloating and abdominal pain. EXAM: PORTABLE ABDOMEN - 1 VIEW COMPARISON:  CT dated 04/12/2016 FINDINGS: Right upper quadrant cholecystectomy clips and a drainage catheter noted. A foreign object with rounded tip in the right upper quadrant at the level of the L2-L3 disc space may represent a stent or a foreign object overlying the patient. This was not seen on the prior CT. Clinical correlation is recommended. There is air throughout the colon. No small bowel dilatation or evidence of obstruction. No free air identified. No radiopaque calculi. Mild degenerative changes of spine. No acute fracture. IMPRESSION: No evidence of obstruction. Electronically Signed   By: Anner Crete M.D.   On: 04/14/2016 22:25  US Abdomen Limited Ruq  Result Date: 04/12/2016 CLINICAL DATA:  Postoperative pain. Diagnosed with cholelithiasis and pancreatitis 4 days ago, now with severe abdominal pain. EXAM: US ABDOMEN LIMITED - RIGHT UPPER QUADRANT COMPARISON:  CT abdomen and pelvis April 12, 2016 at 0345 hours FINDINGS: Gallbladder: Status post cholecystectomy without fluid collection in the gallbladder fossa. Common bile duct: Diameter: 2 mm Liver: No focal lesion identified. Within normal limits in parenchymal echogenicity. Hepatopetal portal vein. Small RIGHT pleural effusion as seen on today CT. IMPRESSION: Status post cholecystectomy.  No acute RIGHT upper quadrant process. Electronically Signed   By: Elon Alas M.D.   On: 04/12/2016 04:53    Adna Nofziger, DO  [facility] Pager [phone]  If 7PM-7AM, please contact night-coverage [email or web address] Password [email or web address] 04/16/2016, 5:57 PM   LOS: 3 days

## 2016-04-17 ENCOUNTER — Inpatient Hospital Stay (HOSPITAL_COMMUNITY): Payer: 59

## 2016-04-17 ENCOUNTER — Encounter (HOSPITAL_COMMUNITY): Payer: Self-pay | Admitting: General Surgery

## 2016-04-17 DIAGNOSIS — I471 Supraventricular tachycardia: Secondary | ICD-10-CM

## 2016-04-17 DIAGNOSIS — E876 Hypokalemia: Secondary | ICD-10-CM

## 2016-04-17 DIAGNOSIS — K838 Other specified diseases of biliary tract: Secondary | ICD-10-CM

## 2016-04-17 DIAGNOSIS — K929 Disease of digestive system, unspecified: Secondary | ICD-10-CM

## 2016-04-17 LAB — PROTIME-INR
INR: 1.1
Prothrombin Time: 14.3 seconds (ref 11.4–15.2)

## 2016-04-17 LAB — COMPREHENSIVE METABOLIC PANEL
ALBUMIN: 2.5 g/dL — AB (ref 3.5–5.0)
ALK PHOS: 76 U/L (ref 38–126)
ALT: 35 U/L (ref 14–54)
AST: 15 U/L (ref 15–41)
Anion gap: 9 (ref 5–15)
BILIRUBIN TOTAL: 1.1 mg/dL (ref 0.3–1.2)
BUN: <5 mg/dL — ABNORMAL LOW (ref 6–20)
CALCIUM: 8.2 mg/dL — AB (ref 8.9–10.3)
CO2: 26 mmol/L (ref 22–32)
CREATININE: 0.67 mg/dL (ref 0.44–1.00)
Chloride: 101 mmol/L (ref 101–111)
GFR calc Af Amer: >60 mL/min (ref >60)
GFR calc non Af Amer: >60 mL/min (ref >60)
GLUCOSE: 74 mg/dL (ref 65–99)
Potassium: 3.5 mmol/L (ref 3.5–5.1)
SODIUM: 136 mmol/L (ref 135–145)
Total Protein: 5.2 g/dL — ABNORMAL LOW (ref 6.5–8.1)

## 2016-04-17 MED ORDER — MIDAZOLAM HCL 2 MG/2ML IJ SOLN
INTRAMUSCULAR | Status: AC
  Filled 2016-04-17: qty 2

## 2016-04-17 MED ORDER — ENOXAPARIN SODIUM 40 MG/0.4ML ~~LOC~~ SOLN
40.0000 mg | SUBCUTANEOUS | Status: DC
  Administered 2016-04-18: 40 mg via SUBCUTANEOUS
  Filled 2016-04-17: qty 0.4

## 2016-04-17 MED ORDER — LIDOCAINE HCL 1 % IJ SOLN
INTRAMUSCULAR | Status: AC
  Filled 2016-04-17: qty 20

## 2016-04-17 MED ORDER — FAMOTIDINE 20 MG PO TABS
20.0000 mg | ORAL_TABLET | Freq: Two times a day (BID) | ORAL | Status: DC
  Administered 2016-04-17 – 2016-04-18 (×2): 20 mg via ORAL
  Filled 2016-04-17 (×3): qty 1

## 2016-04-17 MED ORDER — FENTANYL CITRATE (PF) 100 MCG/2ML IJ SOLN
INTRAMUSCULAR | Status: DC
Start: 2016-04-17 — End: 2016-04-17
  Filled 2016-04-17: qty 2

## 2016-04-17 MED ORDER — MORPHINE SULFATE (PF) 2 MG/ML IV SOLN: 1.0000 mg | INTRAVENOUS | Status: DC | PRN

## 2016-04-17 MED ORDER — TRAMADOL HCL 50 MG PO TABS
100.0000 mg | ORAL_TABLET | Freq: Four times a day (QID) | ORAL | Status: DC
  Administered 2016-04-17 – 2016-04-18 (×4): 100 mg via ORAL
  Filled 2016-04-17 (×4): qty 2

## 2016-04-17 MED ORDER — SODIUM CHLORIDE 0.9 % IV SOLN
500.0000 mg | Freq: Four times a day (QID) | INTRAVENOUS | Status: DC
  Administered 2016-04-17 – 2016-04-18 (×4): 500 mg via INTRAVENOUS
  Filled 2016-04-17 (×6): qty 500

## 2016-04-17 NOTE — Sedation Documentation (Signed)
Procedure started- no sedation given. Pt doing well.

## 2016-04-17 NOTE — Progress Notes (Signed)
Subjective: Abdominal pain improved from yesterday. However, she does not feel ready to go home due to pain. The pain is still localized to RUQ, worse with movement and inspiration. Denies nausea and vomiting. Last BM early this morning. Passing flatus. Ambulating. Patient requests Tramadol frequency adjusted to q6. Accompanied by mother. VSS.   Objective: Vital signs in last 24 hours: Temp:  [98.4 F (36.9 C)-99 F (37.2 C)] 99 F (37.2 C) (07/26 0610) Pulse Rate:  [73-79] 78 (07/26 0610) Resp:  [18] 18 (07/25 1415) BP: (94-105)/(45-54) 105/54 (07/26 0610) SpO2:  [96 %-100 %] 96 % (07/26 0610) Weight:  [148 lb (67.1 kg)] 148 lb (67.1 kg) (07/25 2203) Last BM Date: 04/06/16  Intake/Output from previous day: 07/25 0701 - 07/26 0700 In: 800 [I.V.:800] Out: 3495 [Urine:3400; Drains:95] Intake/Output this shift: No intake/output data recorded.  General appearance  Supine. Awake and talkative.  Head: Normocephalic/atraumatic. PERRL bilaterally. Extraocular eye muscles in tact.  Neck: FROM Cardiovascular: Regular rate and rhythm without gallops, murmurs or rubs Respiratory: Lung sounds vesicular. No wheezes, rales or crackles auscultated Abdomen: Soft. Tender in the RUQ. No guarding, masses, or rigidity. +BS. Laparoscopy sites visualized without erythema or exudate.    Lab Results:   Recent Labs (last 2 labs)    Recent Labs  04/15/16 0425 04/16/16 0402  WBC 11.9* 8.7  HGB 11.2* 10.8*  HCT 35.0* 34.1*  PLT 249 258     BMET  Recent Labs (last 2 labs)    Recent Labs  04/16/16 0402 04/17/16 0517  NA 137 136  K 3.7 3.5  CL 104 101  CO2 27 26  GLUCOSE 86 74  BUN 6 <5*  CREATININE 0.63 0.67  CALCIUM 8.0* 8.2*     PT/INR Recent Labs (last 2 labs)   No results for input(s): LABPROT, INR in the last 72 hours.   ABG  Recent Labs (last 2 labs)   No results for input(s): PHART, HCO3 in the last 72 hours.  Invalid input(s): PCO2, PO2     Studies/Results:  Imaging Results (Last 48 hours)  Nm Hepatobiliary Liver Func  Addendum Date: 04/15/2016   ADDENDUM REPORT: 04/15/2016 17:27 ADDENDUM: Upon further analysis of the hepatobiliary scan, the activity seen along the inferior aspect of the left lobe of the liver may well represent reflux into the duodenal bulb and distal antrum of the stomach rather than a true bile leak. The activity slowly diminishes over the course of the exam while the activity in the C-loop of the duodenum increases. No activity is seen entering the drain in the gallbladder fossa. IMPRESSION: Indeterminate hepatobiliary scan. The activity along the inferior aspect of the left lobe of the liver most likely represents reflux into the stomach. Electronically Signed   By: Lorriane Shire M.D.   On: 04/15/2016 17:27  Result Date: 04/15/2016 CLINICAL DATA:  Postoperative bile leak. EXAM: NUCLEAR MEDICINE HEPATOBILIARY IMAGING TECHNIQUE: Sequential images of the abdomen were obtained out to 60 minutes following intravenous administration of radiopharmaceutical. RADIOPHARMACEUTICALS:  5.2 mCi Tc-52m  Choletec IV COMPARISON:  Abdominal CT scan dated 04/12/2016 and abdominal MRI dated 04/08/2016 FINDINGS: Prompt uptake and biliary excretion of activity by the liver is seen. There is immediate leak each of the activity along the inferior aspect of the left lobe of the liver just above the distal stomach. Majority of the activity is excreted into the bowel. IMPRESSION: Bile leak extending along the undersurface of the left lobe of the liver. Electronically Signed: By: Lorriane Shire  M.D. On: 04/15/2016 14:31    Anti-infectives:           Anti-infectives    Start     Dose/Rate Route Frequency Ordered Stop   04/13/16 1315  imipenem-cilastatin (PRIMAXIN) 500 mg in sodium chloride 0.9 % 100 mL IVPB     500 mg 200 mL/hr over 30 Minutes Intravenous Every 6 hours 04/13/16 1308        Assessment/Plan: Gallstone  pancreatitis S/p lap chole - 04/09/2016 - By Dr. Donne Hazel Postoperative cystic duct leak  Has RUQ JP in place - 95cc/24 hours, serosanguinous  New 10/10 RUQ abdominal pain since 7/23  - MRCP 04/16/2016 - no active bile leak or CBD stone, 3.8 x 0.9 x 4.3 cm fluid collection at GB fossa; spoke with Dr. Anselm Pancoast in IR regarding possible drainage and he will see the patient - appreciate his recommendations.  FEN: will make NPO for possibility of drain by IR, otherwise can advance as tolerated VTE: Lovenox  [id number] Primaxin  Dispo: perc drainage vs non-op mgmt

## 2016-04-17 NOTE — Progress Notes (Deleted)
5 Days Post-Op  Subjective: Abdominal pain improved from yesterday. However, she does not feel ready to go home due to pain. The pain is still localized to RUQ, worse with movement and inspiration. Denies nausea and vomiting. Last BM early this morning. Passing flatus. Ambulating. Patient requests Tramadol frequency adjusted to q6. Accompanied by mother. VSS.   Objective: Vital signs in last 24 hours: Temp:  [98.4 F (36.9 C)-99 F (37.2 C)] 99 F (37.2 C) (07/26 0610) Pulse Rate:  [73-79] 78 (07/26 0610) Resp:  [18] 18 (07/25 1415) BP: (94-105)/(45-54) 105/54 (07/26 0610) SpO2:  [96 %-100 %] 96 % (07/26 0610) Weight:  [148 lb (67.1 kg)] 148 lb (67.1 kg) (07/25 2203) Last BM Date: 04/06/16  Intake/Output from previous day: 07/25 0701 - 07/26 0700 In: 800 [I.V.:800] Out: 3495 [Urine:3400; Drains:95] Intake/Output this shift: No intake/output data recorded.  General appearance  Supine. Awake and talkative.  Head: Normocephalic/atraumatic. PERRL bilaterally. Extraocular eye muscles in tact.  Neck: FROM Cardiovascular: Regular rate and rhythm without gallops, murmurs or rubs Respiratory: Lung sounds vesicular. No wheezes, rales or crackles auscultated Abdomen: Soft. Tender in the RUQ. No guarding, masses, or rigidity. +BS. Laparoscopy sites visualized without erythema or exudate.    Lab Results:   Recent Labs  04/15/16 0425 04/16/16 0402  WBC 11.9* 8.7  HGB 11.2* 10.8*  HCT 35.0* 34.1*  PLT 249 258   BMET  Recent Labs  04/16/16 0402 04/17/16 0517  NA 137 136  K 3.7 3.5  CL 104 101  CO2 27 26  GLUCOSE 86 74  BUN 6 <5*  CREATININE 0.63 0.67  CALCIUM 8.0* 8.2*   PT/INR No results for input(s): LABPROT, INR in the last 72 hours. ABG No results for input(s): PHART, HCO3 in the last 72 hours.  Invalid input(s): PCO2, PO2  Studies/Results: Nm Hepatobiliary Liver Func  Addendum Date: 04/15/2016   ADDENDUM REPORT: 04/15/2016 17:27 ADDENDUM: Upon further analysis  of the hepatobiliary scan, the activity seen along the inferior aspect of the left lobe of the liver may well represent reflux into the duodenal bulb and distal antrum of the stomach rather than a true bile leak. The activity slowly diminishes over the course of the exam while the activity in the C-loop of the duodenum increases. No activity is seen entering the drain in the gallbladder fossa. IMPRESSION: Indeterminate hepatobiliary scan. The activity along the inferior aspect of the left lobe of the liver most likely represents reflux into the stomach. Electronically Signed   By: Lorriane Shire M.D.   On: 04/15/2016 17:27  Result Date: 04/15/2016 CLINICAL DATA:  Postoperative bile leak. EXAM: NUCLEAR MEDICINE HEPATOBILIARY IMAGING TECHNIQUE: Sequential images of the abdomen were obtained out to 60 minutes following intravenous administration of radiopharmaceutical. RADIOPHARMACEUTICALS:  5.2 mCi Tc-75m  Choletec IV COMPARISON:  Abdominal CT scan dated 04/12/2016 and abdominal MRI dated 04/08/2016 FINDINGS: Prompt uptake and biliary excretion of activity by the liver is seen. There is immediate leak each of the activity along the inferior aspect of the left lobe of the liver just above the distal stomach. Majority of the activity is excreted into the bowel. IMPRESSION: Bile leak extending along the undersurface of the left lobe of the liver. Electronically Signed: By: Lorriane Shire M.D. On: 04/15/2016 14:31   Anti-infectives: Anti-infectives    Start     Dose/Rate Route Frequency Ordered Stop   04/13/16 1315  imipenem-cilastatin (PRIMAXIN) 500 mg in sodium chloride 0.9 % 100 mL IVPB  500 mg 200 mL/hr over 30 Minutes Intravenous Every 6 hours 04/13/16 1308        Assessment/Plan: Gallstone pancreatitis S/p lap chole - 04/09/2016 - By Dr. Donne Hazel Postoperative cystic duct leak  Has RUQ JP in place - 95cc/24 hours, serosanguinous  New 10/10 RUQ abdominal pain since 7/23 - [facility] Scan:  Indeterminate results: Activity at inferior aspect of left lobe, likely reflux into the stomach vs. bile leak. - MRCP 04/16/2016 - Awaiting results  Plan to decrease dose of morphine at patient's request and increase frequency of tramadol to q6.Waiting for MRCP impression and body fluid TB from drain. If abdominal pain improves today, home tomorrow.   FEN: Advanced to as tolerated VTE: Lovenox  [id number] Primaxin  Dispo: MRCP results, body fluid TB from drain results, and RUQ pain management  Lannie Fields [facility] [place]  04/17/2016

## 2016-04-17 NOTE — Progress Notes (Signed)
Patient ID: Laurie Terry, female   DOB: 03-20-1966, 50 y.o.   MRN: [id number] [facility]enterology Progress Note  Laurie Terry 50 y.o. 02-14-1966   Subjective: Increased abdominal pain last night but was NPO for MRI so did not get her usual PO pain meds. Pain better controlled today. Mother at bedside.  Objective: Vital signs in last 24 hours: Vitals:   04/16/16 2109 04/17/16 0610  BP: (!) 105/49 (!) 105/54  Pulse: 79 78  Resp:    Temp: 98.7 F (37.1 C) 99 F (37.2 C)    Physical Exam: Gen: alert, no acute distress HEENT: anicteric sclera CV: RRR Chest: CTA B Abd: focal RUQ tenderness with guarding, mild epigastric tenderness with guarding, soft, nondistended, +BS, small amount of serous fluid in JP drain Ext: no edema  Lab Results:  Recent Labs  04/16/16 0402 04/17/16 0517  NA 137 136  K 3.7 3.5  CL 104 101  CO2 27 26  GLUCOSE 86 74  BUN 6 <5*  CREATININE 0.63 0.67  CALCIUM 8.0* 8.2*    Recent Labs  04/16/16 0402 04/17/16 0517  AST 12* 15  ALT 39 35  ALKPHOS 89 76  BILITOT 0.8 1.1  PROT 4.9* 5.2*  ALBUMIN 2.4* 2.5*    Recent Labs  04/15/16 0425 04/16/16 0402  WBC 11.9* 8.7  NEUTROABS 9.8*  --   HGB 11.2* 10.8*  HCT 35.0* 34.1*  MCV 89.7 90.5  PLT 249 258   No results for input(s): LABPROT, INR in the last 72 hours.    Assessment/Plan: Postop bile leak that has resolved on HIDA with ongoing RUQ pain and MRCP (result not back during my evaluation) showing a small abscess, which would be consistent with the focal RUQ pain she is having above the site where the drain comes out. No fluid collection seen at drain catheter on MRCP. May need IR to place another drain for this abscess vs conservative management with slow advancing of diet if surgery ok with that. Will defer to surgery recs. Continue Abx and supportive care.   Canute C. 04/17/2016, 8:30 AM  Pager [phone]  If no answer or after 5 PM call [phone]

## 2016-04-17 NOTE — Progress Notes (Signed)
PROGRESS NOTE  Laurie Terry [id number] DOB: 1966-04-29 DOA: 04/12/2016 PCP: Antony Blackbird, MD  Brief History: 50 year old female with a remote history of melanoma and cervical dysplasia presented with increasing abdominal pain associated with increased drainage and change of character of drainage from her JP drain. The patient was recently hospitalized from 04/06/2016 through 04/11/2016 for biliary pancreatitis for which she had a laparoscopic cholecystectomy on 04/09/2016. She was discharged in stable condition after she tolerated a soft diet. Unfortunately, she began developing increasing epigastric pain after eating at home. Because of the changes in her JP drainage and abdominal pain she presented to the emergency department. CT of the abdomen and pelvis on 04/12/2016 revealed no unexpected findings status post cholecystectomy. There was expected fluid in the operative bed, and the surgical drain was in the subhepatic space. GI and general surgery were consulted. 04/12/16 ERCP with sphincterotomy was performed by Dr. Watt Climes, but a stent was unable to be placed. The patient was admitted for continued observation post ERCP and concerns for biliary leak. The patient continued to have intermittent abdominal pain with continued significant JP output.  04/15/2016 HIDA scan confirmed bile leak.  Assessment/Plan: Postoperative biliary leak status post laparoscopic cholecystectomy - Status post laparoscopic cholecystectomy 04/09/16 by Dr. Donne Hazel - Current admission for suspected postoperative cystic duct leak. - Has RUQ JP drain in place: 95 ML in the last 24 hour, serosanguineous drainage. -04/12/2016 CT abdomen and pelvis no unexpected finding status post laparoscopic cholecystectomy. Expect fluid seen in the operative bed with a surgical drain in the subhepatic space -04/12/2016 ERCP with sphincterotomy --Dr. Watt Climes -04/15/16 --HIDA--NEG  bile leak upon second review-->MRCP ordered  r/o biloma - New severe RUQ abdominal pain since 7/23. - MRCP 04/16/16: No active bile leak or CBD stone. 3.8 x 0.9 x 4.3 cm fluid collection in gallbladder fossa. General surgery consultation and follow-up appreciated >requested IR for possible drainage. - GI follow-up appreciated: Postop bile leak has resolved on HIDA and small abscess in RUQ. Continue antibiotics and supportive care.  Fever -UA unrevealing -resolved after starting imipenem -continue imipenem started on 7/22 -follow blood culture 7/22--neg to date -hemodynamically stable  Hypokalemia  -Repleted -Check magnesium--1.8  Paroxysmal supraventricular tachycardia  -Presently in sinus rhythm  -TSH 0.457, free T4 0.97  -04/11/2016 echo EF 55-60%, no WMA  Constipation -colace and senna -add bisacodyl. Patient had BM 7/26  Anemia - Stable.   DVT prophylaxis: Lovenox CODE STATUS: Full Family communication: Discussed with patient. No family at bedside. Disposition: DC home when clinically improved, possibly in the next 2-3 days.   Consultants:  GI--[facility] Interventional radiology    Procedures: RUQ JP drain.  Antibiotics: Imipenem 04/13/16>>>   Subjective: Patient states that this is the best she has felt for some time. RUQ abdominal pain was significant yesterday and last night but improved this morning. Had BM 7/26. No nausea or vomiting reported. Made NPO by [facility] for possible drainage procedure by IR.  Objective: Vitals:   04/16/16 2109 04/16/16 2203 04/17/16 0610 04/17/16 1421  BP: (!) 105/49  (!) 105/54 (!) 109/56  Pulse: 79  78 76  Resp:    18  Temp: 98.7 F (37.1 C)  99 F (37.2 C) 98.1 F (36.7 C)  TempSrc: Oral  Oral Oral  SpO2: 99%  96% 98%  Weight:  67.1 kg (148 lb)    Height:  5\' 6"  (1.676 m)      Intake/Output Summary (  Last 24 hours) at 04/17/16 1429 Last data filed at 04/17/16 1422  Gross per 24 hour  Intake              800 ml  Output              3125 ml  Net            -2325 ml   Weight change:  Exam:   General:  Pleasant young female sitting up comfortably in chair this morning. Did not appear septic or toxic.  HEENT: No icterus, No thrush, No neck mass, [place]/AT  Cardiovascular: RRR, S1/S2, no rubs, no gallops  Respiratory: CTA bilaterally, no wheezing, no crackles, no rhonchi  Abdomen: Soft/+BS, RUQ mildly tender without rebound, non distended, no guarding. No peritoneal signs. RUQ JP drain with mild serosanguineous fluid.  Extremities: No edema, No lymphangitis, No petechiae, No rashes, no synovitis   Data Reviewed: I have personally reviewed following labs and imaging studies Basic Metabolic Panel:  Recent Labs Lab 04/12/16 0458 04/13/16 0326 04/14/16 0219 04/15/16 0425 04/16/16 0402 04/17/16 0517  NA  --  142 136 138 137 136  K  --  3.1* 3.4* 3.5 3.7 3.5  CL  --  109 105 107 104 101  CO2  --  27 26 27 27 26   GLUCOSE  --  97 116* 116* 86 74  BUN  --  <5* <5* <5* 6 <5*  CREATININE  --  0.63 0.54 0.59 0.63 0.67  CALCIUM  --  8.2* 8.1* 8.0* 8.0* 8.2*  MG 1.8  --  1.8  --   --   --   PHOS 3.2  --   --   --   --   --    Liver Function Tests:  Recent Labs Lab 04/13/16 0326 04/14/16 0219 04/15/16 0425 04/16/16 0402 04/17/16 0517  AST 36 53* 26 12* 15  ALT 57* 77* 59* 39 35  ALKPHOS 63 70 72 89 76  BILITOT 0.7 1.5* 1.0 0.8 1.1  PROT 4.9* 5.2* 5.1* 4.9* 5.2*  ALBUMIN 2.8* 2.7* 2.5* 2.4* 2.5*    Recent Labs Lab 04/11/16 2241 04/13/16 0326  LIPASE 33 45   No results for input(s): AMMONIA in the last 168 hours. Coagulation Profile:  Recent Labs Lab 04/17/16 1152  INR 1.10   CBC:  Recent Labs Lab 04/12/16 1429 04/13/16 0326 04/14/16 0219 04/15/16 0425 04/16/16 0402  WBC 8.0 9.2 11.5* 11.9* 8.7  NEUTROABS 7.1  --   --  9.8*  --   HGB 12.3 11.1* 11.6* 11.2* 10.8*  HCT 37.8 33.5* 36.6 35.0* 34.1*  MCV 89.8 89.1 90.4 89.7 90.5  PLT 228 224 256 249 258   Cardiac Enzymes:  Recent  Labs Lab 04/12/16 0458  TROPONINI <0.03   BNP: Invalid input(s): POCBNP CBG: No results for input(s): GLUCAP in the last 168 hours. HbA1C: No results for input(s): HGBA1C in the last 72 hours. Urine analysis:    Component Value Date/Time   COLORURINE YELLOW 04/13/2016 [place] 04/13/2016 1252   LABSPEC 1.010 04/13/2016 1252   PHURINE 7.5 04/13/2016 1252   GLUCOSEU NEGATIVE 04/13/2016 1252   HGBUR NEGATIVE 04/13/2016 1252   BILIRUBINUR NEGATIVE 04/13/2016 1252   KETONESUR 15 (A) 04/13/2016 1252   PROTEINUR NEGATIVE 04/13/2016 1252   UROBILINOGEN 0.2 03/30/2015 0852   NITRITE NEGATIVE 04/13/2016 1252   LEUKOCYTESUR NEGATIVE 04/13/2016 1252   Sepsis Labs: @LABRCNTIP (procalcitonin:4,lacticidven:4) ) Recent Results (from the past 240 hour(s))  Urine culture     Status: Abnormal   Collection Time: 04/13/16 12:52 PM  Result Value Ref Range Status   Specimen Description URINE, CLEAN CATCH  Final   Special Requests NONE  Final   Culture MULTIPLE SPECIES PRESENT, SUGGEST RECOLLECTION (A)  Final   Report Status 04/14/2016 FINAL  Final  MRSA PCR Screening     Status: None   Collection Time: 04/13/16  1:15 PM  Result Value Ref Range Status   MRSA by PCR NEGATIVE NEGATIVE Final    Comment:        The GeneXpert MRSA Assay (FDA approved for NASAL specimens only), is one component of a comprehensive MRSA colonization surveillance program. It is not intended to diagnose MRSA infection nor to guide or monitor treatment for MRSA infections.   Culture, blood (Routine X 2) w Reflex to ID Panel     Status: None (Preliminary result)   Collection Time: 04/13/16  2:05 PM  Result Value Ref Range Status   Specimen Description BLOOD LEFT ASSIST CONTROL  Final   Special Requests BOTTLES DRAWN AEROBIC AND ANAEROBIC 5CC  Final   Culture NO GROWTH 3 DAYS  Final   Report Status PENDING  Incomplete  Culture, blood (Routine X 2) w Reflex to ID Panel     Status: None  (Preliminary result)   Collection Time: 04/13/16  2:11 PM  Result Value Ref Range Status   Specimen Description BLOOD LEFT ANTECUBITAL  Final   Special Requests BOTTLES DRAWN AEROBIC AND ANAEROBIC 5CC  Final   Culture NO GROWTH 3 DAYS  Final   Report Status PENDING  Incomplete     Scheduled Meds: . docusate sodium  100 mg Oral BID  . enoxaparin (LOVENOX) injection  40 mg Subcutaneous Q24H  . famotidine  20 mg Oral BID  . imipenem-cilastatin  500 mg Intravenous Q6H  . potassium citrate  10 mEq Oral TID WC  . senna  2 tablet Oral Daily  . traMADol  100 mg Oral Q6H   Continuous Infusions: . sodium chloride 100 mL/hr at 04/17/16 0525  . dextrose 5 % and 0.9 % NaCl with KCl 20 mEq/L 75 mL/hr at 04/14/16 2145  . lactated ringers Stopped (04/12/16 1231)    Procedures/Studies: Ct Abdomen Pelvis Wo Contrast  Result Date: 04/12/2016 CLINICAL DATA:  Right-sided abdominal pain.  Recent cholecystectomy. EXAM: CT ABDOMEN AND PELVIS WITHOUT CONTRAST TECHNIQUE: Multidetector CT imaging of the abdomen and pelvis was performed following the standard protocol without IV contrast. COMPARISON:  04/08/2016 MRI FINDINGS: Lower chest and abdominal wall: Changes of recent laparoscopic surgery. No signs of abscess. Trace pleural effusions, greater on the right, with minimal atelectasis. Hepatobiliary: Recent cholecystectomy with expected fluid and stranding in the operative bed. There is a surgical drain in the subhepatic space. No concerning ascites. No visible choledocholithiasis. No biliary distention. Pancreas: Unremarkable. Spleen: Unremarkable. Adrenals/Urinary Tract: Negative adrenals. No hydronephrosis or stone. Unremarkable bladder. Stomach/Bowel:  No obstruction. No appendicitis. Reproductive:IUD in place. Vascular/Lymphatic: No acute vascular abnormality. No mass or adenopathy. Other: Trace pelvic fluid. No perihepatic ascites. Small volume residual pneumoperitoneum. Musculoskeletal: Expansile sacral  Tarlov cysts on the left at S2, incidental in this setting. IMPRESSION: 1. No unexpected finding after recent cholecystectomy. 2. Trace pleural effusions and mild basilar atelectasis. Electronically Signed   By: Monte Fantasia M.D.   On: 04/12/2016 04:00   Dg Chest 2 View  Result Date: 04/06/2016 CLINICAL DATA:  50 year old female with chest pain EXAM: CHEST  2 VIEW COMPARISON:  Chest radiograph dated 03/29/2015 FINDINGS: The heart size and mediastinal contours are within normal limits. Both lungs are clear. The visualized skeletal structures are unremarkable. IMPRESSION: No active cardiopulmonary disease. Electronically Signed   By: Anner Crete M.D.   On: 04/06/2016 22:16   Nm Hepatobiliary Liver Func  Addendum Date: 04/15/2016   ADDENDUM REPORT: 04/15/2016 17:27 ADDENDUM: Upon further analysis of the hepatobiliary scan, the activity seen along the inferior aspect of the left lobe of the liver may well represent reflux into the duodenal bulb and distal antrum of the stomach rather than a true bile leak. The activity slowly diminishes over the course of the exam while the activity in the C-loop of the duodenum increases. No activity is seen entering the drain in the gallbladder fossa. IMPRESSION: Indeterminate hepatobiliary scan. The activity along the inferior aspect of the left lobe of the liver most likely represents reflux into the stomach. Electronically Signed   By: Lorriane Shire M.D.   On: 04/15/2016 17:27  Result Date: 04/15/2016 CLINICAL DATA:  Postoperative bile leak. EXAM: NUCLEAR MEDICINE HEPATOBILIARY IMAGING TECHNIQUE: Sequential images of the abdomen were obtained out to 60 minutes following intravenous administration of radiopharmaceutical. RADIOPHARMACEUTICALS:  5.2 mCi Tc-30m  Choletec IV COMPARISON:  Abdominal CT scan dated 04/12/2016 and abdominal MRI dated 04/08/2016 FINDINGS: Prompt uptake and biliary excretion of activity by the liver is seen. There is immediate leak each of  the activity along the inferior aspect of the left lobe of the liver just above the distal stomach. Majority of the activity is excreted into the bowel. IMPRESSION: Bile leak extending along the undersurface of the left lobe of the liver. Electronically Signed: By: Lorriane Shire M.D. On: 04/15/2016 14:31  US Abdomen Complete  Result Date: 04/06/2016 CLINICAL DATA:  Right back pain and epigastric pain with nausea for 8 months. History of melanoma. EXAM: ABDOMEN ULTRASOUND COMPLETE COMPARISON:  None. FINDINGS: Gallbladder: Several stones in the gallbladder, largest measuring 1.9 cm diameter. Small amount of sludge layering in the gallbladder. No gallbladder wall thickening. Murphy's sign is negative. Common bile duct: Diameter: 8 mm.  Upper limits of normal range. Liver: No focal lesion identified. Within normal limits in parenchymal echogenicity. IVC: No abnormality visualized. Pancreas: Visualized portion unremarkable. Spleen: Size and appearance within normal limits. Right Kidney: Length: 10.9 cm. Echogenicity within normal limits. No mass or hydronephrosis visualized. Left Kidney: Length: 10.8 cm. Echogenicity within normal limits. No mass or hydronephrosis visualized. Abdominal aorta: No aneurysm visualized. Other findings: None. IMPRESSION: Cholelithiasis and gallbladder sludge. No additional changes to suggest cholecystitis. Extrahepatic bile duct measures 8 mm diameter, representing upper limits of normal range. Electronically Signed   By: Lucienne Capers M.D.   On: 04/06/2016 23:35   Mr Abdomen Mrcp Wo Cm  Result Date: 04/08/2016 CLINICAL DATA:  Gallstone pancreatitis. Past medical history of melanoma 15 years ago. Epigastric abdominal pain. Elevated lipase and liver function tests. EXAM: MRI ABDOMEN WITHOUT CONTRAST  (INCLUDING MRCP) TECHNIQUE: Multiplanar multisequence MR imaging of the abdomen was performed. Heavily T2-weighted images of the biliary and pancreatic ducts were obtained, and  three-dimensional MRCP images were rendered by post processing. COMPARISON:  04/06/2016 abdominal ultrasound. FINDINGS: Mild degradation secondary to motion and less so lack of IV contrast. Lower chest: Trace right larger than left pleural effusions. Normal heart size. Hepatobiliary: No focal liver lesion. No intrahepatic ductal dilatation. Gallstones including at 16 mm. Moderate-to-marked gallbladder wall thickening, including at up to 12 mm on image 22/series 4. No common duct dilatation.  No choledocholithiasis. Pancreas: No pancreatic duct dilatation or dominant mass. There is nonspecific edema within the anterior pararenal space, including adjacent the tail on image 19/series 8. No peripancreatic fluid collection. Spleen: Normal in size, without focal abnormality. Adrenals/Urinary Tract: Normal adrenal glands. Normal kidneys, without hydronephrosis. Stomach/Bowel: Normal stomach and abdominal bowel loops. Vascular/Lymphatic: Normal caliber of the aorta and branch vessels. No retroperitoneal or retrocrural adenopathy. Other: Small volume perihepatic and perisplenic ascites. Musculoskeletal: No acute osseous abnormality. IMPRESSION: 1. Mild degradation secondary to motion and lack of IV contrast. 2. No evidence of biliary duct dilatation or choledocholithiasis. 3. Cholelithiasis with gallbladder wall thickening. This wall thickening is new since 04/06/2016 ultrasound. If acute cholecystitis is a concern, consider repeat ultrasound or nuclear medicine hepatobiliary study. 4. No specific findings of pancreatitis or its complications. There is minimal ascites with nonspecific edema throughout the anterior pararenal space. 5. Small bilateral pleural effusions. Electronically Signed   By: Abigail Miyamoto M.D.   On: 04/08/2016 15:46   Dg Ercp Biliary & Pancreatic Ducts  Result Date: 04/12/2016 CLINICAL DATA:  50 year old female undergoing ERCP. History of recent cholecystectomy. EXAM: ERCP TECHNIQUE: Multiple spot  images obtained with the fluoroscopic device and submitted for interpretation post-procedure. FLUOROSCOPY TIME:  If the device does not provide the exposure index: Fluoroscopy Time:  9 minutes 40 seconds reported COMPARISON:  CT abdomen/pelvis 04/12/2016; abdominal ultrasound 04/12/2016 FINDINGS: Two intraoperative spot images demonstrate a flexible endoscope in the descending duodenum with cannulation of the common bile duct. Partial opacification of the biliary tree is also noted. A short pigtail drain is present in the pancreatic duct. IMPRESSION: ERCP and pancreatic duct placement. These images were submitted for radiologic interpretation only. Please see the procedural report for the amount of contrast and the fluoroscopy time utilized. Electronically Signed   By: Jacqulynn Cadet M.D.   On: 04/12/2016 13:42   Dg Abd Portable 1v  Result Date: 04/14/2016 CLINICAL DATA:  50 year old female with bloating and abdominal pain. EXAM: PORTABLE ABDOMEN - 1 VIEW COMPARISON:  CT dated 04/12/2016 FINDINGS: Right upper quadrant cholecystectomy clips and a drainage catheter noted. A foreign object with rounded tip in the right upper quadrant at the level of the L2-L3 disc space may represent a stent or a foreign object overlying the patient. This was not seen on the prior CT. Clinical correlation is recommended. There is air throughout the colon. No small bowel dilatation or evidence of obstruction. No free air identified. No radiopaque calculi. Mild degenerative changes of spine. No acute fracture. IMPRESSION: No evidence of obstruction. Electronically Signed   By: Anner Crete M.D.   On: 04/14/2016 22:25  Mr Jeananne Rama W/wo Cm/mrcp  Result Date: 04/17/2016 CLINICAL DATA:  50 year old female status post laparoscopic cholecystectomy. Following discharge from hospital, patient experienced pressure/pain in the abdomen with drainage through the surgical drain. Evaluate for potential bile leak or other postoperative  complication. EXAM: MRI ABDOMEN WITHOUT AND WITH CONTRAST (INCLUDING MRCP) TECHNIQUE: Multiplanar multisequence MR imaging of the abdomen was performed both before and after the administration of intravenous contrast. Heavily T2-weighted images of the biliary and pancreatic ducts were obtained, and three-dimensional MRCP images were rendered by post processing. CONTRAST:  36mL MULTIHANCE GADOBENATE DIMEGLUMINE 529 MG/ML IV SOLN COMPARISON:  MRI of the abdomen 04/08/2016. CT the abdomen 04/12/2016. Nuclear medicine hepatobiliary scan 04/15/2016. FINDINGS: Lower chest: Small right and trace left pleural effusions lying dependently. Increased signal intensity in the dependent portions of the lower lobes of the lungs bilaterally, likely related to passive  subsegmental atelectasis. Hepatobiliary: No cystic or solid hepatic lesions. No intra or extrahepatic biliary ductal dilatation. Common bile duct measures 5 mm in the porta hepatis. Status post cholecystectomy. Minimal T2 signal intensity in the gallbladder fossa, compatible with resolving postoperative inflammation. However, along the anterior aspect of the liver adjacent to the gallbladder fossa there is a small component of fluid which does appear to be organized with a thick rim of enhancement, which measures approximately 3.8 x 0.9 x 4.3 cm (axial image 52 of series 1304 and coronal image 56 of series 14), suspicious for a small developing abscess. MRCP images are suboptimal secondary to respiratory motion and small volume of ascites. With these limitations in mind the distal common bile duct is poorly demonstrated, but no definite choledocholithiasis is identified. Pancreas: No pancreatic mass. No pancreatic ductal dilatation. No pancreatic or peripancreatic fluid or inflammatory changes. Spleen: Unremarkable. Adrenals/Urinary Tract: Sub cm lesions in the kidneys bilaterally are T1 hypointense, T2 hyperintense, and do not enhance, compatible with tiny simple  cysts. No hydroureteronephrosis in the visualized abdomen. Bilateral adrenal glands are normal in appearance. Stomach/Bowel: Visualized portions are unremarkable. Vascular/Lymphatic: No aneurysm identified in the visualized abdominal vasculature. No lymphadenopathy noted in the abdomen. Other: Surgical drain in the right side of the abdomen extending into the subhepatic recess beneath segments 6 and 7, with tip terminating in close proximity to the right adrenal gland. No surrounding fluid collection adjacent to the drainage catheter. There is a small amount of perihepatic ascites in the subdiaphragmatic region adjacent to the right lobe of the liver, which does not appear to be organized. There is a small organized fluid collection along the anterior aspect of the liver adjacent to the gallbladder fossa (as discussed above), which is suspicious for a small developing abscess. Musculoskeletal: No aggressive appearing osseous lesions are noted in the visualized portions of the skeleton. IMPRESSION: 1. Postoperative changes related to recent cholecystectomy, with resolving postoperative inflammation in the region of the gallbladder fossa. Immediately anterior to the liver adjacent to the gallbladder fossa there is a small organized rim enhancing fluid collection, concerning for a developing abscess, which currently measures 3.8 x 0.9 x 4.3 cm (as detailed above). 2. Small volume of perihepatic ascites in the right subdiaphragmatic region. 3. Small right and trace left pleural effusions with mild dependent subsegmental passive atelectasis. 4. Additional findings, as above. These results will be called to the ordering clinician or representative by the Radiologist Assistant, and communication documented in the PACS or zVision Dashboard. Electronically Signed   By: Vinnie Langton M.D.   On: 04/17/2016 08:26  US Abdomen Limited Ruq  Result Date: 04/12/2016 CLINICAL DATA:  Postoperative pain. Diagnosed with  cholelithiasis and pancreatitis 4 days ago, now with severe abdominal pain. EXAM: US ABDOMEN LIMITED - RIGHT UPPER QUADRANT COMPARISON:  CT abdomen and pelvis April 12, 2016 at 0345 hours FINDINGS: Gallbladder: Status post cholecystectomy without fluid collection in the gallbladder fossa. Common bile duct: Diameter: 2 mm Liver: No focal lesion identified. Within normal limits in parenchymal echogenicity. Hepatopetal portal vein. Small RIGHT pleural effusion as seen on today CT. IMPRESSION: Status post cholecystectomy.  No acute RIGHT upper quadrant process. Electronically Signed   By: Elon Alas M.D.   On: 04/12/2016 04:53    Jan Walters, MD, FACP, FHM. [facility] Pager [phone]  If 7PM-7AM, please contact night-coverage [email or web address] Password [email or web address] 04/17/2016, 2:29 PM   LOS: 4 days

## 2016-04-17 NOTE — Sedation Documentation (Addendum)
Pt tolerated numbing with Lidocaine to abd well

## 2016-04-17 NOTE — Sedation Documentation (Addendum)
MD aspirating yellowish fluid, pt tolerating well- approx 4cc

## 2016-04-17 NOTE — Procedures (Signed)
Small perihepatic fluid seen on MRI.  CT guided aspiration of the perihepatic fluid collection.  4 ml of yellow-green fluid was aspirated.  Will send fluid for culture.

## 2016-04-17 NOTE — Progress Notes (Signed)
Patient ID: Laurie Terry, female   DOB: 1966-09-08, 50 y.o.   MRN: [id number]    Referring Physician(s): Dr. Donnie Mesa  Supervising Physician: Markus Daft  Patient Status: inpt  Chief Complaint: Perihepatic fluid collection  Subjective: Patient known to [place] for recent evaluation for possible drain placement s/p cholecystectomy.  No drain was placed 2 days ago.  She does have a surgical drain.  She underwent an MRCP yesterday due to persistent RUQ abdominal pain.  She was found to have a perihepatic fluid collection.  We have been asked to aspirate vs drain this collection.  Allergies: Iodine; Other; Ampicillin; Contrast media [iodinated diagnostic agents]; Levofloxacin; and Septra [bactrim]  Medications: Prior to Admission medications   Medication Sig Start Date End Date Taking? Authorizing Provider  HYDROcodone-acetaminophen (LORTAB 10) 10-500 MG tablet Take 1 tablet by mouth every 6 (six) hours as needed for pain. Patient not taking: Reported on 04/12/2016 04/11/16   Reyne Dumas, MD    Vital Signs: BP (!) 105/54 (BP Location: Left Arm)   Pulse 78   Temp 99 F (37.2 C) (Oral)   Resp 18   Ht 5\' 6"  (1.676 m)   Wt 148 lb (67.1 kg)   SpO2 96%   BMI 23.89 kg/m   Physical Exam: Heart: regular Lungs: CTAB And: soft, tender in RUQ, incisions are c/d/i, JP drain with bilious output in place.  Imaging: Nm Hepatobiliary Liver Func  Addendum Date: 04/15/2016   ADDENDUM REPORT: 04/15/2016 17:27 ADDENDUM: Upon further analysis of the hepatobiliary scan, the activity seen along the inferior aspect of the left lobe of the liver may well represent reflux into the duodenal bulb and distal antrum of the stomach rather than a true bile leak. The activity slowly diminishes over the course of the exam while the activity in the C-loop of the duodenum increases. No activity is seen entering the drain in the gallbladder fossa. IMPRESSION: Indeterminate hepatobiliary scan. The activity  along the inferior aspect of the left lobe of the liver most likely represents reflux into the stomach. Electronically Signed   By: Lorriane Shire M.D.   On: 04/15/2016 17:27  Result Date: 04/15/2016 CLINICAL DATA:  Postoperative bile leak. EXAM: NUCLEAR MEDICINE HEPATOBILIARY IMAGING TECHNIQUE: Sequential images of the abdomen were obtained out to 60 minutes following intravenous administration of radiopharmaceutical. RADIOPHARMACEUTICALS:  5.2 mCi Tc-12m  Choletec IV COMPARISON:  Abdominal CT scan dated 04/12/2016 and abdominal MRI dated 04/08/2016 FINDINGS: Prompt uptake and biliary excretion of activity by the liver is seen. There is immediate leak each of the activity along the inferior aspect of the left lobe of the liver just above the distal stomach. Majority of the activity is excreted into the bowel. IMPRESSION: Bile leak extending along the undersurface of the left lobe of the liver. Electronically Signed: By: Lorriane Shire M.D. On: 04/15/2016 14:31  Dg Abd Portable 1v  Result Date: 04/14/2016 CLINICAL DATA:  50 year old female with bloating and abdominal pain. EXAM: PORTABLE ABDOMEN - 1 VIEW COMPARISON:  CT dated 04/12/2016 FINDINGS: Right upper quadrant cholecystectomy clips and a drainage catheter noted. A foreign object with rounded tip in the right upper quadrant at the level of the L2-L3 disc space may represent a stent or a foreign object overlying the patient. This was not seen on the prior CT. Clinical correlation is recommended. There is air throughout the colon. No small bowel dilatation or evidence of obstruction. No free air identified. No radiopaque calculi. Mild degenerative changes of spine. No acute  fracture. IMPRESSION: No evidence of obstruction. Electronically Signed   By: Anner Crete M.D.   On: 04/14/2016 22:25  Mr Jeananne Rama W/wo Cm/mrcp  Result Date: 04/17/2016 CLINICAL DATA:  50 year old female status post laparoscopic cholecystectomy. Following discharge from hospital,  patient experienced pressure/pain in the abdomen with drainage through the surgical drain. Evaluate for potential bile leak or other postoperative complication. EXAM: MRI ABDOMEN WITHOUT AND WITH CONTRAST (INCLUDING MRCP) TECHNIQUE: Multiplanar multisequence MR imaging of the abdomen was performed both before and after the administration of intravenous contrast. Heavily T2-weighted images of the biliary and pancreatic ducts were obtained, and three-dimensional MRCP images were rendered by post processing. CONTRAST:  33mL MULTIHANCE GADOBENATE DIMEGLUMINE 529 MG/ML IV SOLN COMPARISON:  MRI of the abdomen 04/08/2016. CT the abdomen 04/12/2016. Nuclear medicine hepatobiliary scan 04/15/2016. FINDINGS: Lower chest: Small right and trace left pleural effusions lying dependently. Increased signal intensity in the dependent portions of the lower lobes of the lungs bilaterally, likely related to passive subsegmental atelectasis. Hepatobiliary: No cystic or solid hepatic lesions. No intra or extrahepatic biliary ductal dilatation. Common bile duct measures 5 mm in the porta hepatis. Status post cholecystectomy. Minimal T2 signal intensity in the gallbladder fossa, compatible with resolving postoperative inflammation. However, along the anterior aspect of the liver adjacent to the gallbladder fossa there is a small component of fluid which does appear to be organized with a thick rim of enhancement, which measures approximately 3.8 x 0.9 x 4.3 cm (axial image 52 of series 1304 and coronal image 56 of series 14), suspicious for a small developing abscess. MRCP images are suboptimal secondary to respiratory motion and small volume of ascites. With these limitations in mind the distal common bile duct is poorly demonstrated, but no definite choledocholithiasis is identified. Pancreas: No pancreatic mass. No pancreatic ductal dilatation. No pancreatic or peripancreatic fluid or inflammatory changes. Spleen: Unremarkable.  Adrenals/Urinary Tract: Sub cm lesions in the kidneys bilaterally are T1 hypointense, T2 hyperintense, and do not enhance, compatible with tiny simple cysts. No hydroureteronephrosis in the visualized abdomen. Bilateral adrenal glands are normal in appearance. Stomach/Bowel: Visualized portions are unremarkable. Vascular/Lymphatic: No aneurysm identified in the visualized abdominal vasculature. No lymphadenopathy noted in the abdomen. Other: Surgical drain in the right side of the abdomen extending into the subhepatic recess beneath segments 6 and 7, with tip terminating in close proximity to the right adrenal gland. No surrounding fluid collection adjacent to the drainage catheter. There is a small amount of perihepatic ascites in the subdiaphragmatic region adjacent to the right lobe of the liver, which does not appear to be organized. There is a small organized fluid collection along the anterior aspect of the liver adjacent to the gallbladder fossa (as discussed above), which is suspicious for a small developing abscess. Musculoskeletal: No aggressive appearing osseous lesions are noted in the visualized portions of the skeleton. IMPRESSION: 1. Postoperative changes related to recent cholecystectomy, with resolving postoperative inflammation in the region of the gallbladder fossa. Immediately anterior to the liver adjacent to the gallbladder fossa there is a small organized rim enhancing fluid collection, concerning for a developing abscess, which currently measures 3.8 x 0.9 x 4.3 cm (as detailed above). 2. Small volume of perihepatic ascites in the right subdiaphragmatic region. 3. Small right and trace left pleural effusions with mild dependent subsegmental passive atelectasis. 4. Additional findings, as above. These results will be called to the ordering clinician or representative by the Radiologist Assistant, and communication documented in the PACS or zVision Dashboard. Electronically  Signed   By: Vinnie Langton M.D.   On: 04/17/2016 08:26   Labs:  CBC:  Recent Labs  04/13/16 0326 04/14/16 0219 04/15/16 0425 04/16/16 0402  WBC 9.2 11.5* 11.9* 8.7  HGB 11.1* 11.6* 11.2* 10.8*  HCT 33.5* 36.6 35.0* 34.1*  PLT 224 256 249 258    COAGS: No results for input(s): INR, APTT in the last 8760 hours.  BMP:  Recent Labs  04/14/16 0219 04/15/16 0425 04/16/16 0402 04/17/16 0517  NA 136 138 137 136  K 3.4* 3.5 3.7 3.5  CL 105 107 104 101  CO2 26 27 27 26   GLUCOSE 116* 116* 86 74  BUN <5* <5* 6 <5*  CALCIUM 8.1* 8.0* 8.0* 8.2*  CREATININE 0.54 0.59 0.63 0.67  GFRNONAA >60 >60 >60 >60  GFRAA >60 >60 >60 >60    LIVER FUNCTION TESTS:  Recent Labs  04/14/16 0219 04/15/16 0425 04/16/16 0402 04/17/16 0517  BILITOT 1.5* 1.0 0.8 1.1  AST 53* 26 12* 15  ALT 77* 59* 39 35  ALKPHOS 70 72 89 76  PROT 5.2* 5.1* 4.9* 5.2*  ALBUMIN 2.7* 2.5* 2.4* 2.5*    Assessment and Plan: 1. S/p cholecystectomy with bile leak and perihepatic fluid collection -we will plan on aspiration vs drain placement for this perihepatic fluid collection. -labs and vitals have been reviewed -Pt/INR pending -Risks and Benefits discussed with the patient including bleeding, infection, damage to adjacent structures, bowel perforation/fistula connection, and sepsis. All of the patient's questions were answered, patient is agreeable to proceed. Consent signed and in chart.   Electronically Signed: Henreitta Cea 04/17/2016, 11:25 AM   I spent a total of 25 Minutes at the the patient's bedside AND on the patient's hospital floor or unit, greater than 50% of which was counseling/coordinating care for perihepatic fluid collection

## 2016-04-17 NOTE — Progress Notes (Signed)
Pt. Feeling fine after procedure. Biopsy site covered with a bandaid. No complaints of increasing pain from pt. Will continue to monitor.

## 2016-04-17 NOTE — Sedation Documentation (Signed)
Pt spoke with MD - she prefers to not have sedation unless she asks for it- MD aware.

## 2016-04-18 LAB — CULTURE, BLOOD (ROUTINE X 2)
CULTURE: NO GROWTH
CULTURE: NO GROWTH

## 2016-04-18 MED ORDER — ACETAMINOPHEN 325 MG PO TABS: 650.0000 mg | ORAL_TABLET | Freq: Four times a day (QID) | ORAL | Status: DC | PRN

## 2016-04-18 MED ORDER — TRAMADOL HCL 50 MG PO TABS: 50.0000 mg | ORAL_TABLET | Freq: Four times a day (QID) | ORAL | 0 refills | Status: DC | PRN

## 2016-04-18 NOTE — Progress Notes (Signed)
Patient ID: Laurie Terry, female   DOB: Feb 12, 1966, 50 y.o.   MRN: [id number] [facility] Gastroenterology Progress Note  Laurie Terry 50 y.o. [birth year]   Subjective: Feels ok. Had a bout of diarrhea today. Tolerating solid food. Having mild abdominal pain in RUQ.  Objective: Vital signs in last 24 hours: Vitals:   04/17/16 2156 04/18/16 0557  BP: (!) 103/55 (!) 99/55  Pulse: 70 75  Resp: 18 17  Temp: 99 F (37.2 C) 99.1 F (37.3 C)    Physical Exam: Gen: alert, no acute distress HEENT: anicteric sclera CV: RRR Chest: CTA B Abd: RUQ focal tenderness with guarding, soft, nondistended, +BS  Lab Results:  Recent Labs  04/16/16 0402 04/17/16 0517  NA 137 136  K 3.7 3.5  CL 104 101  CO2 27 26  GLUCOSE 86 74  BUN 6 <5*  CREATININE 0.63 0.67  CALCIUM 8.0* 8.2*    Recent Labs  04/16/16 0402 04/17/16 0517  AST 12* 15  ALT 39 35  ALKPHOS 89 76  BILITOT 0.8 1.1  PROT 4.9* 5.2*  ALBUMIN 2.4* 2.5*    Recent Labs  04/16/16 0402  WBC 8.7  HGB 10.8*  HCT 34.1*  MCV 90.5  PLT 258    Recent Labs  04/17/16 1152  LABPROT 14.3  INR 1.10      Assessment/Plan: S/P postop bile leak that has resolved following sphincterotomy. Minimal serous drainage in JP drain. S/P CT guided drain of biloma yesterday and culture pending. Patient has a pancreatic duct stent that is present on KUB from yesterday and will need to be removed endoscopically in 4-5 weeks if it does not fall out on its own. Needs a KUB in 4 weeks to see if pancreatic duct pigtail stent is still present and if it is will need an EGD to have it removed (our office will arrange KUB if necessary). Stable to go home today and surgery planning to d/c drain prior to discharge.   Pablo Pena C. 04/18/2016, 1:59 PM  Pager [phone]  If no answer or after 5 PM call [phone]

## 2016-04-18 NOTE — Progress Notes (Signed)
6 Days Post-Op  Subjective: No complaints. Feels better. Wants to go home  Objective: Vital signs in last 24 hours: Temp:  [98.1 F (36.7 C)-99.1 F (37.3 C)] 99.1 F (37.3 C) (07/27 0557) Pulse Rate:  [70-76] 75 (07/27 0557) Resp:  [16-18] 17 (07/27 0557) BP: (99-119)/(55-67) 99/55 (07/27 0557) SpO2:  [95 %-100 %] 95 % (07/27 0557) Last BM Date: 04/16/16  Intake/Output from previous day: 07/26 0701 - 07/27 0700 In: 1560 [P.O.:360; I.V.:1100; IV Piggyback:100] Out: 5265 [Urine:5200; Drains:65] Intake/Output this shift: Total I/O In: 558.7 [P.O.:200; I.V.:358.7] Out: 510 [Urine:500; Drains:10]  Resp: clear to auscultation bilaterally Cardio: regular rate and rhythm GI: soft, mild tenderness. incisions look good. drain output low and serous  Lab Results:   Recent Labs  04/16/16 0402  WBC 8.7  HGB 10.8*  HCT 34.1*  PLT 258   BMET  Recent Labs  04/16/16 0402 04/17/16 0517  NA 137 136  K 3.7 3.5  CL 104 101  CO2 27 26  GLUCOSE 86 74  BUN 6 <5*  CREATININE 0.63 0.67  CALCIUM 8.0* 8.2*   PT/INR  Recent Labs  04/17/16 1152  LABPROT 14.3  INR 1.10   ABG No results for input(s): PHART, HCO3 in the last 72 hours.  Invalid input(s): PCO2, PO2  Studies/Results: Ct Aspiration  Result Date: 04/17/2016 INDICATION: 50 year old with history of cholecystectomy and concern for bile leak. Recent MRI demonstrated a small amount of perihepatic fluid. Request for a CT-guided aspiration or drainage to exclude an abscess. EXAM: CT-GUIDED ASPIRATION OF PERIHEPATIC FLUID COLLECTION MEDICATIONS: None ANESTHESIA/SEDATION: None COMPLICATIONS: None immediate. TECHNIQUE: Informed written consent was obtained from the patient after a thorough discussion of the procedural risks, benefits and alternatives. All questions were addressed. A timeout was performed prior to the initiation of the procedure. PROCEDURE: Patient was placed supine on the CT scanner. Images through the upper  abdomen obtained. The anterior right upper abdomen was prepped and draped in sterile fashion. Skin was anesthetized with 1% lidocaine. Using CT guidance, an 18 gauge needle was directed into the anterior perihepatic fluid collection. Approximately 4 mL of yellowish green clear fluid was aspirated. Needle was removed. Fluid was sent for culture. Bandage placed over the puncture site. FINDINGS: Small anterior perihepatic fluid collection. Needle was placed within this collection and yellowish green fluid was removed. IMPRESSION: Successful CT-guided aspiration of the small anterior perihepatic fluid. Fluid could represent a small amount of bile or serous fluid. This fluid was sent for culture. Electronically Signed   By: Markus Daft M.D.   On: 04/17/2016 19:55  Mr Jeananne Rama W/wo Cm/mrcp  Result Date: 04/17/2016 CLINICAL DATA:  50 year old female status post laparoscopic cholecystectomy. Following discharge from hospital, patient experienced pressure/pain in the abdomen with drainage through the surgical drain. Evaluate for potential bile leak or other postoperative complication. EXAM: MRI ABDOMEN WITHOUT AND WITH CONTRAST (INCLUDING MRCP) TECHNIQUE: Multiplanar multisequence MR imaging of the abdomen was performed both before and after the administration of intravenous contrast. Heavily T2-weighted images of the biliary and pancreatic ducts were obtained, and three-dimensional MRCP images were rendered by post processing. CONTRAST:  48mL MULTIHANCE GADOBENATE DIMEGLUMINE 529 MG/ML IV SOLN COMPARISON:  MRI of the abdomen 04/08/2016. CT the abdomen 04/12/2016. Nuclear medicine hepatobiliary scan 04/15/2016. FINDINGS: Lower chest: Small right and trace left pleural effusions lying dependently. Increased signal intensity in the dependent portions of the lower lobes of the lungs bilaterally, likely related to passive subsegmental atelectasis. Hepatobiliary: No cystic or solid hepatic lesions. No  intra or extrahepatic biliary  ductal dilatation. Common bile duct measures 5 mm in the porta hepatis. Status post cholecystectomy. Minimal T2 signal intensity in the gallbladder fossa, compatible with resolving postoperative inflammation. However, along the anterior aspect of the liver adjacent to the gallbladder fossa there is a small component of fluid which does appear to be organized with a thick rim of enhancement, which measures approximately 3.8 x 0.9 x 4.3 cm (axial image 52 of series 1304 and coronal image 56 of series 14), suspicious for a small developing abscess. MRCP images are suboptimal secondary to respiratory motion and small volume of ascites. With these limitations in mind the distal common bile duct is poorly demonstrated, but no definite choledocholithiasis is identified. Pancreas: No pancreatic mass. No pancreatic ductal dilatation. No pancreatic or peripancreatic fluid or inflammatory changes. Spleen: Unremarkable. Adrenals/Urinary Tract: Sub cm lesions in the kidneys bilaterally are T1 hypointense, T2 hyperintense, and do not enhance, compatible with tiny simple cysts. No hydroureteronephrosis in the visualized abdomen. Bilateral adrenal glands are normal in appearance. Stomach/Bowel: Visualized portions are unremarkable. Vascular/Lymphatic: No aneurysm identified in the visualized abdominal vasculature. No lymphadenopathy noted in the abdomen. Other: Surgical drain in the right side of the abdomen extending into the subhepatic recess beneath segments 6 and 7, with tip terminating in close proximity to the right adrenal gland. No surrounding fluid collection adjacent to the drainage catheter. There is a small amount of perihepatic ascites in the subdiaphragmatic region adjacent to the right lobe of the liver, which does not appear to be organized. There is a small organized fluid collection along the anterior aspect of the liver adjacent to the gallbladder fossa (as discussed above), which is suspicious for a small  developing abscess. Musculoskeletal: No aggressive appearing osseous lesions are noted in the visualized portions of the skeleton. IMPRESSION: 1. Postoperative changes related to recent cholecystectomy, with resolving postoperative inflammation in the region of the gallbladder fossa. Immediately anterior to the liver adjacent to the gallbladder fossa there is a small organized rim enhancing fluid collection, concerning for a developing abscess, which currently measures 3.8 x 0.9 x 4.3 cm (as detailed above). 2. Small volume of perihepatic ascites in the right subdiaphragmatic region. 3. Small right and trace left pleural effusions with mild dependent subsegmental passive atelectasis. 4. Additional findings, as above. These results will be called to the ordering clinician or representative by the Radiologist Assistant, and communication documented in the PACS or zVision Dashboard. Electronically Signed   By: Vinnie Langton M.D.   On: 04/17/2016 08:26   Anti-infectives: Anti-infectives    Start     Dose/Rate Route Frequency Ordered Stop   04/17/16 1200  imipenem-cilastatin (PRIMAXIN) 500 mg in sodium chloride 0.9 % 100 mL IVPB  Status:  Discontinued     500 mg 200 mL/hr over 30 Minutes Intravenous Every 6 hours 04/17/16 0940 04/18/16 0822   04/13/16 1315  imipenem-cilastatin (PRIMAXIN) 500 mg in sodium chloride 0.9 % 100 mL IVPB  Status:  Discontinued     500 mg 200 mL/hr over 30 Minutes Intravenous Every 6 hours 04/13/16 1308 04/17/16 0826      Assessment/Plan: s/p Procedure(s): ENDOSCOPIC RETROGRADE CHOLANGIOPANCREATOGRAPHY (ERCP) (N/A) Advance diet Discharge  D/c drain  LOS: 5 days    TOTH III,PAUL S 04/18/2016

## 2016-04-18 NOTE — Discharge Summary (Signed)
Physician Discharge Summary  Laurie Terry [id number] DOB: 05-24-1966  PCP: Antony Blackbird, MD  Admit date: 04/12/2016 Discharge date: 04/18/2016  Admitted From: Home Disposition:  Home  Recommendations for Outpatient Follow-up:  1. Dr. Antony Blackbird, PCP in 5 days with repeat labs (CBC + CMP). Please follow final results of RUQ fluid collection that was aspirated by CT guidance on 7/26. 2. Dr. Rolm Bookbinder, General Surgery on 05/03/16 at 10:20 AM for postop follow-up.  Home Health: None Equipment/Devices: None    Discharge Condition: Improved and stable.  CODE STATUS: Full  Diet recommendation: Heart healthy diet.  Discharge Diagnoses:  Principal Problem:   Postoperative bile leak Active Problems:   History of Gallstone pancreatitis   S/P laparoscopic cholecystectomy   Acute hypokalemia   Dehydration, mild   Paroxysmal SVT (supraventricular tachycardia) (HCC)   Bile leak, postoperative   Brief/Interim Summary: 50 year old female with a remote history of melanoma and cervical dysplasia presented with increasing abdominal pain associated with increased drainage and change of character of drainage from her JP drain. The patient was recently hospitalized from 04/06/2016 through 04/11/2016 for biliary pancreatitis for which she had a laparoscopic cholecystectomy on 04/09/2016. She was discharged in stable condition after she tolerated a soft diet. Unfortunately, she began developing increasing epigastric pain after eating at home. Because of the changes in her JP drainage and abdominal pain she presented to the emergency department. CT of the abdomen and pelvis on 04/12/2016 revealed no unexpected findings status post cholecystectomy. There was expected fluid in the operative bed, and the surgical drain was in the subhepatic space. GI and general surgery were consulted. 04/12/16 ERCP with sphincterotomy was performed by Dr. Watt Climes, but a stent was unable to be placed. The patient  was admitted for continued observation post ERCP and concerns for biliary leak. The patient continued to have intermittent abdominal pain with continued significant JP output. 04/15/2016 HIDA scan was not indicative of biliary leak.  Assessment/Plan: Postoperative biliary leak status post laparoscopic cholecystectomy - Status post laparoscopic cholecystectomy 04/09/16 by Dr. Donne Hazel - Current admission for suspected postoperative cystic duct leak. - Had RUQ JP drain in place which should gradually decreased drainage of serosanguineous fluid. General surgery followed patient today and discontinue JP drain prior to discharge. -04/12/2016 CT abdomen and pelvis no unexpected finding status post laparoscopic cholecystectomy. Expect fluid seen in the operative bed with a surgical drain in the subhepatic space -04/12/2016 ERCP with sphincterotomy --Dr. Watt Climes -04/15/16 --HIDA--NEG  bile leak upon second review-->MRCP ordered r/o biloma - New severe RUQ abdominal pain since 7/23. - MRCP 04/16/16: No active bile leak or CBD stone. 3.8 x 0.9 x 4.3 cm fluid collection in gallbladder fossa. General surgery consulted IR who performed CT-guided aspiration of 4 mL of yellow-green fluid which was sent for culture-follow final results as outpatient. - Gen. surgery evaluated the patient today. I discussed with Dr. Molli Posey & Obie Dredge, PA. Antibiotics were discontinued by surgery this morning. They have cleared her for discharge home after removing JP drain and have arranged for outpatient follow-up. Prior to discharge, patient was seen ambulating comfortably in the hall, tolerated diet with minimal RUQ abdominal pain controlled with Tylenol alone. She had one loose BM this morning but none since.  Fever -UA unrevealing -resolved after starting imipenem - Blood cultures 2: Negative. Urine culture suggestive of contamination. RUQ collection that was drained on 7/26: Results pending and to be followed as  outpatient.  Hypokalemia  -Repleted -Check magnesium--1.8  Paroxysmal supraventricular tachycardia  -  Presently in sinus rhythm  -TSH 0.457, free T4 0.97 . Clinically euthyroid. -04/11/2016 echo EF 55-60%, no WMA  Constipation -Seems to have resolved. Had one loose stool this morning and none since. DC laxatives at discharge.  Anemia - Stable. Outpatient follow-up.  Status post gallstone pancreatitis - resolved    Consultants:  GI--[facility] General Surgery Interventional radiology    Procedures: RUQ JP drain. CT-guided drainage of RUQ collection by IR on 7/26  Antibiotics: Imipenem 04/13/16>>> 7/27  Discharge Instructions  Discharge Instructions    Call MD for:  extreme fatigue    Complete by:  As directed   Call MD for:  persistant dizziness or light-headedness    Complete by:  As directed   Call MD for:  persistant nausea and vomiting    Complete by:  As directed   Call MD for:  redness, tenderness, or signs of infection (pain, swelling, redness, odor or green/yellow discharge around incision site)    Complete by:  As directed   Call MD for:  severe uncontrolled pain    Complete by:  As directed   Call MD for:  temperature >100.4    Complete by:  As directed   Diet general    Complete by:  As directed   Increase activity slowly    Complete by:  As directed       Medication List    STOP taking these medications   HYDROcodone-acetaminophen 10-500 MG tablet Commonly known as:  LORTAB 10     TAKE these medications   acetaminophen 325 MG tablet Commonly known as:  TYLENOL Take 2 tablets (650 mg total) by mouth every 6 (six) hours as needed for mild pain, moderate pain, fever or headache.   traMADol 50 MG tablet Commonly known as:  ULTRAM Take 1-2 tablets (50-100 mg total) by mouth every 6 (six) hours as needed for severe pain.      Follow-up Information    FULP, CAMMIE, MD. Schedule an appointment as soon as possible for a visit in 5  day(s).   Specialty:  Family Medicine Why:  To be seen with repeat labs (CBC & CMP). Contact information: [id number] [place] [address] [id number]        Rolm Bookbinder, MD. Daphane Shepherd on 05/03/2016.   Specialty:  General Surgery Why:  your appointment is at 10:20 AM for post operative follow-up. please arrive 30 minutes early to get checked in and fill out any necessary paperwork. Contact information: [address] [place] [address] [phone]          Allergies  Allergen Reactions  . Iodine Shortness Of Breath and Swelling  . Other Hives and Swelling    Seafood  . Ampicillin Other (See Comments)    unknown  . Contrast Media [Iodinated Diagnostic Agents]     Shortness of breath  . Levofloxacin Hives  . Septra [Bactrim] Hives     Procedures/Studies: Ct Abdomen Pelvis Wo Contrast  Result Date: 04/12/2016 CLINICAL DATA:  Right-sided abdominal pain.  Recent cholecystectomy. EXAM: CT ABDOMEN AND PELVIS WITHOUT CONTRAST TECHNIQUE: Multidetector CT imaging of the abdomen and pelvis was performed following the standard protocol without IV contrast. COMPARISON:  04/08/2016 MRI FINDINGS: Lower chest and abdominal wall: Changes of recent laparoscopic surgery. No signs of abscess. Trace pleural effusions, greater on the right, with minimal atelectasis. Hepatobiliary: Recent cholecystectomy with expected fluid and stranding in the operative bed. There is a surgical drain in the subhepatic space. No concerning ascites.  No visible choledocholithiasis. No biliary distention. Pancreas: Unremarkable. Spleen: Unremarkable. Adrenals/Urinary Tract: Negative adrenals. No hydronephrosis or stone. Unremarkable bladder. Stomach/Bowel:  No obstruction. No appendicitis. Reproductive:IUD in place. Vascular/Lymphatic: No acute vascular abnormality. No mass or adenopathy. Other: Trace pelvic fluid. No perihepatic ascites. Small volume residual pneumoperitoneum. Musculoskeletal:  Expansile sacral Tarlov cysts on the left at S2, incidental in this setting. IMPRESSION: 1. No unexpected finding after recent cholecystectomy. 2. Trace pleural effusions and mild basilar atelectasis. Electronically Signed   By: Monte Fantasia M.D.   On: 04/12/2016 04:00   Dg Chest 2 View  Result Date: 04/06/2016 CLINICAL DATA:  50 year old female with chest pain EXAM: CHEST  2 VIEW COMPARISON:  Chest radiograph dated 03/29/2015 FINDINGS: The heart size and mediastinal contours are within normal limits. Both lungs are clear. The visualized skeletal structures are unremarkable. IMPRESSION: No active cardiopulmonary disease. Electronically Signed   By: Anner Crete M.D.   On: 04/06/2016 22:16   Nm Hepatobiliary Liver Func  Addendum Date: 04/15/2016   ADDENDUM REPORT: 04/15/2016 17:27 ADDENDUM: Upon further analysis of the hepatobiliary scan, the activity seen along the inferior aspect of the left lobe of the liver may well represent reflux into the duodenal bulb and distal antrum of the stomach rather than a true bile leak. The activity slowly diminishes over the course of the exam while the activity in the C-loop of the duodenum increases. No activity is seen entering the drain in the gallbladder fossa. IMPRESSION: Indeterminate hepatobiliary scan. The activity along the inferior aspect of the left lobe of the liver most likely represents reflux into the stomach. Electronically Signed   By: Lorriane Shire M.D.   On: 04/15/2016 17:27  Result Date: 04/15/2016 CLINICAL DATA:  Postoperative bile leak. EXAM: NUCLEAR MEDICINE HEPATOBILIARY IMAGING TECHNIQUE: Sequential images of the abdomen were obtained out to 60 minutes following intravenous administration of radiopharmaceutical. RADIOPHARMACEUTICALS:  5.2 mCi Tc-64m  Choletec IV COMPARISON:  Abdominal CT scan dated 04/12/2016 and abdominal MRI dated 04/08/2016 FINDINGS: Prompt uptake and biliary excretion of activity by the liver is seen. There is  immediate leak each of the activity along the inferior aspect of the left lobe of the liver just above the distal stomach. Majority of the activity is excreted into the bowel. IMPRESSION: Bile leak extending along the undersurface of the left lobe of the liver. Electronically Signed: By: Lorriane Shire M.D. On: 04/15/2016 14:31  US Abdomen Complete  Result Date: 04/06/2016 CLINICAL DATA:  Right back pain and epigastric pain with nausea for 8 months. History of melanoma. EXAM: ABDOMEN ULTRASOUND COMPLETE COMPARISON:  None. FINDINGS: Gallbladder: Several stones in the gallbladder, largest measuring 1.9 cm diameter. Small amount of sludge layering in the gallbladder. No gallbladder wall thickening. Murphy's sign is negative. Common bile duct: Diameter: 8 mm.  Upper limits of normal range. Liver: No focal lesion identified. Within normal limits in parenchymal echogenicity. IVC: No abnormality visualized. Pancreas: Visualized portion unremarkable. Spleen: Size and appearance within normal limits. Right Kidney: Length: 10.9 cm. Echogenicity within normal limits. No mass or hydronephrosis visualized. Left Kidney: Length: 10.8 cm. Echogenicity within normal limits. No mass or hydronephrosis visualized. Abdominal aorta: No aneurysm visualized. Other findings: None. IMPRESSION: Cholelithiasis and gallbladder sludge. No additional changes to suggest cholecystitis. Extrahepatic bile duct measures 8 mm diameter, representing upper limits of normal range. Electronically Signed   By: Lucienne Capers M.D.   On: 04/06/2016 23:35   Mr Abdomen Mrcp Wo Cm  Result Date: 04/08/2016 CLINICAL DATA:  Gallstone  pancreatitis. Past medical history of melanoma 15 years ago. Epigastric abdominal pain. Elevated lipase and liver function tests. EXAM: MRI ABDOMEN WITHOUT CONTRAST  (INCLUDING MRCP) TECHNIQUE: Multiplanar multisequence MR imaging of the abdomen was performed. Heavily T2-weighted images of the biliary and pancreatic ducts  were obtained, and three-dimensional MRCP images were rendered by post processing. COMPARISON:  04/06/2016 abdominal ultrasound. FINDINGS: Mild degradation secondary to motion and less so lack of IV contrast. Lower chest: Trace right larger than left pleural effusions. Normal heart size. Hepatobiliary: No focal liver lesion. No intrahepatic ductal dilatation. Gallstones including at 16 mm. Moderate-to-marked gallbladder wall thickening, including at up to 12 mm on image 22/series 4. No common duct dilatation.  No choledocholithiasis. Pancreas: No pancreatic duct dilatation or dominant mass. There is nonspecific edema within the anterior pararenal space, including adjacent the tail on image 19/series 8. No peripancreatic fluid collection. Spleen: Normal in size, without focal abnormality. Adrenals/Urinary Tract: Normal adrenal glands. Normal kidneys, without hydronephrosis. Stomach/Bowel: Normal stomach and abdominal bowel loops. Vascular/Lymphatic: Normal caliber of the aorta and branch vessels. No retroperitoneal or retrocrural adenopathy. Other: Small volume perihepatic and perisplenic ascites. Musculoskeletal: No acute osseous abnormality. IMPRESSION: 1. Mild degradation secondary to motion and lack of IV contrast. 2. No evidence of biliary duct dilatation or choledocholithiasis. 3. Cholelithiasis with gallbladder wall thickening. This wall thickening is new since 04/06/2016 ultrasound. If acute cholecystitis is a concern, consider repeat ultrasound or nuclear medicine hepatobiliary study. 4. No specific findings of pancreatitis or its complications. There is minimal ascites with nonspecific edema throughout the anterior pararenal space. 5. Small bilateral pleural effusions. Electronically Signed   By: Abigail Miyamoto M.D.   On: 04/08/2016 15:46   Ct Aspiration  Result Date: 04/17/2016 INDICATION: 50 year old with history of cholecystectomy and concern for bile leak. Recent MRI demonstrated a small amount of  perihepatic fluid. Request for a CT-guided aspiration or drainage to exclude an abscess. EXAM: CT-GUIDED ASPIRATION OF PERIHEPATIC FLUID COLLECTION MEDICATIONS: None ANESTHESIA/SEDATION: None COMPLICATIONS: None immediate. TECHNIQUE: Informed written consent was obtained from the patient after a thorough discussion of the procedural risks, benefits and alternatives. All questions were addressed. A timeout was performed prior to the initiation of the procedure. PROCEDURE: Patient was placed supine on the CT scanner. Images through the upper abdomen obtained. The anterior right upper abdomen was prepped and draped in sterile fashion. Skin was anesthetized with 1% lidocaine. Using CT guidance, an 18 gauge needle was directed into the anterior perihepatic fluid collection. Approximately 4 mL of yellowish green clear fluid was aspirated. Needle was removed. Fluid was sent for culture. Bandage placed over the puncture site. FINDINGS: Small anterior perihepatic fluid collection. Needle was placed within this collection and yellowish green fluid was removed. IMPRESSION: Successful CT-guided aspiration of the small anterior perihepatic fluid. Fluid could represent a small amount of bile or serous fluid. This fluid was sent for culture. Electronically Signed   By: Markus Daft M.D.   On: 04/17/2016 19:55  Dg Ercp Biliary & Pancreatic Ducts  Result Date: 04/12/2016 CLINICAL DATA:  50 year old female undergoing ERCP. History of recent cholecystectomy. EXAM: ERCP TECHNIQUE: Multiple spot images obtained with the fluoroscopic device and submitted for interpretation post-procedure. FLUOROSCOPY TIME:  If the device does not provide the exposure index: Fluoroscopy Time:  9 minutes 40 seconds reported COMPARISON:  CT abdomen/pelvis 04/12/2016; abdominal ultrasound 04/12/2016 FINDINGS: Two intraoperative spot images demonstrate a flexible endoscope in the descending duodenum with cannulation of the common bile duct. Partial  opacification of the  biliary tree is also noted. A short pigtail drain is present in the pancreatic duct. IMPRESSION: ERCP and pancreatic duct placement. These images were submitted for radiologic interpretation only. Please see the procedural report for the amount of contrast and the fluoroscopy time utilized. Electronically Signed   By: Jacqulynn Cadet M.D.   On: 04/12/2016 13:42   Dg Abd Portable 1v  Result Date: 04/14/2016 CLINICAL DATA:  50 year old female with bloating and abdominal pain. EXAM: PORTABLE ABDOMEN - 1 VIEW COMPARISON:  CT dated 04/12/2016 FINDINGS: Right upper quadrant cholecystectomy clips and a drainage catheter noted. A foreign object with rounded tip in the right upper quadrant at the level of the L2-L3 disc space may represent a stent or a foreign object overlying the patient. This was not seen on the prior CT. Clinical correlation is recommended. There is air throughout the colon. No small bowel dilatation or evidence of obstruction. No free air identified. No radiopaque calculi. Mild degenerative changes of spine. No acute fracture. IMPRESSION: No evidence of obstruction. Electronically Signed   By: Anner Crete M.D.   On: 04/14/2016 22:25  Mr Jeananne Rama W/wo Cm/mrcp  Result Date: 04/17/2016 CLINICAL DATA:  50 year old female status post laparoscopic cholecystectomy. Following discharge from hospital, patient experienced pressure/pain in the abdomen with drainage through the surgical drain. Evaluate for potential bile leak or other postoperative complication. EXAM: MRI ABDOMEN WITHOUT AND WITH CONTRAST (INCLUDING MRCP) TECHNIQUE: Multiplanar multisequence MR imaging of the abdomen was performed both before and after the administration of intravenous contrast. Heavily T2-weighted images of the biliary and pancreatic ducts were obtained, and three-dimensional MRCP images were rendered by post processing. CONTRAST:  58mL MULTIHANCE GADOBENATE DIMEGLUMINE 529 MG/ML IV SOLN COMPARISON:   MRI of the abdomen 04/08/2016. CT the abdomen 04/12/2016. Nuclear medicine hepatobiliary scan 04/15/2016. FINDINGS: Lower chest: Small right and trace left pleural effusions lying dependently. Increased signal intensity in the dependent portions of the lower lobes of the lungs bilaterally, likely related to passive subsegmental atelectasis. Hepatobiliary: No cystic or solid hepatic lesions. No intra or extrahepatic biliary ductal dilatation. Common bile duct measures 5 mm in the porta hepatis. Status post cholecystectomy. Minimal T2 signal intensity in the gallbladder fossa, compatible with resolving postoperative inflammation. However, along the anterior aspect of the liver adjacent to the gallbladder fossa there is a small component of fluid which does appear to be organized with a thick rim of enhancement, which measures approximately 3.8 x 0.9 x 4.3 cm (axial image 52 of series 1304 and coronal image 56 of series 14), suspicious for a small developing abscess. MRCP images are suboptimal secondary to respiratory motion and small volume of ascites. With these limitations in mind the distal common bile duct is poorly demonstrated, but no definite choledocholithiasis is identified. Pancreas: No pancreatic mass. No pancreatic ductal dilatation. No pancreatic or peripancreatic fluid or inflammatory changes. Spleen: Unremarkable. Adrenals/Urinary Tract: Sub cm lesions in the kidneys bilaterally are T1 hypointense, T2 hyperintense, and do not enhance, compatible with tiny simple cysts. No hydroureteronephrosis in the visualized abdomen. Bilateral adrenal glands are normal in appearance. Stomach/Bowel: Visualized portions are unremarkable. Vascular/Lymphatic: No aneurysm identified in the visualized abdominal vasculature. No lymphadenopathy noted in the abdomen. Other: Surgical drain in the right side of the abdomen extending into the subhepatic recess beneath segments 6 and 7, with tip terminating in close proximity to  the right adrenal gland. No surrounding fluid collection adjacent to the drainage catheter. There is a small amount of perihepatic ascites in the subdiaphragmatic region  adjacent to the right lobe of the liver, which does not appear to be organized. There is a small organized fluid collection along the anterior aspect of the liver adjacent to the gallbladder fossa (as discussed above), which is suspicious for a small developing abscess. Musculoskeletal: No aggressive appearing osseous lesions are noted in the visualized portions of the skeleton. IMPRESSION: 1. Postoperative changes related to recent cholecystectomy, with resolving postoperative inflammation in the region of the gallbladder fossa. Immediately anterior to the liver adjacent to the gallbladder fossa there is a small organized rim enhancing fluid collection, concerning for a developing abscess, which currently measures 3.8 x 0.9 x 4.3 cm (as detailed above). 2. Small volume of perihepatic ascites in the right subdiaphragmatic region. 3. Small right and trace left pleural effusions with mild dependent subsegmental passive atelectasis. 4. Additional findings, as above. These results will be called to the ordering clinician or representative by the Radiologist Assistant, and communication documented in the PACS or zVision Dashboard. Electronically Signed   By: Vinnie Langton M.D.   On: 04/17/2016 08:26  US Abdomen Limited Ruq  Result Date: 04/12/2016 CLINICAL DATA:  Postoperative pain. Diagnosed with cholelithiasis and pancreatitis 4 days ago, now with severe abdominal pain. EXAM: US ABDOMEN LIMITED - RIGHT UPPER QUADRANT COMPARISON:  CT abdomen and pelvis April 12, 2016 at 0345 hours FINDINGS: Gallbladder: Status post cholecystectomy without fluid collection in the gallbladder fossa. Common bile duct: Diameter: 2 mm Liver: No focal lesion identified. Within normal limits in parenchymal echogenicity. Hepatopetal portal vein. Small RIGHT pleural  effusion as seen on today CT. IMPRESSION: Status post cholecystectomy.  No acute RIGHT upper quadrant process. Electronically Signed   By: Elon Alas M.D.   On: 04/12/2016 04:53      Subjective: Patient seen this morning. Ambulating comfortably in the room. Tolerated breakfast. Had a loose stool one hour after breakfast. Minimal intermittent RUQ pain. No nausea or vomiting. Subsequently tolerated lunch with mild pain relieved by Tylenol. No further BM. As per RN, JP drain removed.  Discharge Exam:  Vitals:   04/17/16 1750 04/17/16 2156 04/18/16 0557 04/18/16 1417  BP: (!) 108/56 (!) 103/55 (!) 99/55 (!) 90/53  Pulse: 71 70 75 93  Resp: 18 18 17 18   Temp: 98.4 F (36.9 C) 99 F (37.2 C) 99.1 F (37.3 C) 98.4 F (36.9 C)  TempSrc: Oral Oral  Oral  SpO2: 100% 96% 95% 97%  Weight:      Height:         General:  Pleasant young female seen ambulating comfortably in the hall. Did not appear septic or toxic.  HEENT: No icterus, No thrush, No neck mass, [place]/AT  Cardiovascular: RRR, S1/S2, no rubs, no gallops  Respiratory: CTA bilaterally, no wheezing, no crackles, no rhonchi  Abdomen:  nondistended, soft and nontender. Normal bowel sounds heard.   CNS: Alert and oriented. No focal neurological deficits.  Extremities: No edema, No lymphangitis, No petechiae, No rashes, no synovitis     The results of significant diagnostics from this hospitalization (including imaging, microbiology, ancillary and laboratory) are listed below for reference.     Microbiology: Recent Results (from the past 240 hour(s))  Urine culture     Status: Abnormal   Collection Time: 04/13/16 12:52 PM  Result Value Ref Range Status   Specimen Description URINE, CLEAN CATCH  Final   Special Requests NONE  Final   Culture MULTIPLE SPECIES PRESENT, SUGGEST RECOLLECTION (A)  Final   Report Status 04/14/2016 FINAL  Final  MRSA PCR Screening     Status: None   Collection Time: 04/13/16  1:15 PM   Result Value Ref Range Status   MRSA by PCR NEGATIVE NEGATIVE Final    Comment:        The GeneXpert MRSA Assay (FDA approved for NASAL specimens only), is one component of a comprehensive MRSA colonization surveillance program. It is not intended to diagnose MRSA infection nor to guide or monitor treatment for MRSA infections.   Culture, blood (Routine X 2) w Reflex to ID Panel     Status: None   Collection Time: 04/13/16  2:05 PM  Result Value Ref Range Status   Specimen Description BLOOD LEFT ASSIST CONTROL  Final   Special Requests BOTTLES DRAWN AEROBIC AND ANAEROBIC 5CC  Final   Culture NO GROWTH 5 DAYS  Final   Report Status 04/18/2016 FINAL  Final  Culture, blood (Routine X 2) w Reflex to ID Panel     Status: None   Collection Time: 04/13/16  2:11 PM  Result Value Ref Range Status   Specimen Description BLOOD LEFT ANTECUBITAL  Final   Special Requests BOTTLES DRAWN AEROBIC AND ANAEROBIC 5CC  Final   Culture NO GROWTH 5 DAYS  Final   Report Status 04/18/2016 FINAL  Final     Labs: BNP (last 3 results) No results for input(s): BNP in the last 8760 hours. Basic Metabolic Panel:  Recent Labs Lab 04/12/16 0458 04/13/16 0326 04/14/16 0219 04/15/16 0425 04/16/16 0402 04/17/16 0517  NA  --  142 136 138 137 136  K  --  3.1* 3.4* 3.5 3.7 3.5  CL  --  109 105 107 104 101  CO2  --  27 26 27 27 26   GLUCOSE  --  97 116* 116* 86 74  BUN  --  <5* <5* <5* 6 <5*  CREATININE  --  0.63 0.54 0.59 0.63 0.67  CALCIUM  --  8.2* 8.1* 8.0* 8.0* 8.2*  MG 1.8  --  1.8  --   --   --   PHOS 3.2  --   --   --   --   --    Liver Function Tests:  Recent Labs Lab 04/13/16 0326 04/14/16 0219 04/15/16 0425 04/16/16 0402 04/17/16 0517  AST 36 53* 26 12* 15  ALT 57* 77* 59* 39 35  ALKPHOS 63 70 72 89 76  BILITOT 0.7 1.5* 1.0 0.8 1.1  PROT 4.9* 5.2* 5.1* 4.9* 5.2*  ALBUMIN 2.8* 2.7* 2.5* 2.4* 2.5*    Recent Labs Lab 04/11/16 2241 04/13/16 0326  LIPASE 33 45   No results  for input(s): AMMONIA in the last 168 hours. CBC:  Recent Labs Lab 04/12/16 1429 04/13/16 0326 04/14/16 0219 04/15/16 0425 04/16/16 0402  WBC 8.0 9.2 11.5* 11.9* 8.7  NEUTROABS 7.1  --   --  9.8*  --   HGB 12.3 11.1* 11.6* 11.2* 10.8*  HCT 37.8 33.5* 36.6 35.0* 34.1*  MCV 89.8 89.1 90.4 89.7 90.5  PLT 228 224 256 249 258   Cardiac Enzymes:  Recent Labs Lab 04/12/16 0458  TROPONINI <0.03   BNP: Invalid input(s): POCBNP CBG: No results for input(s): GLUCAP in the last 168 hours. D-Dimer No results for input(s): DDIMER in the last 72 hours. Hgb A1c No results for input(s): HGBA1C in the last 72 hours. Lipid Profile No results for input(s): CHOL, HDL, LDLCALC, TRIG, CHOLHDL, LDLDIRECT in the last 72 hours. Thyroid function studies No results  for input(s): TSH, T4TOTAL, T3FREE, THYROIDAB in the last 72 hours.  Invalid input(s): FREET3 Anemia work up No results for input(s): VITAMINB12, FOLATE, FERRITIN, TIBC, IRON, RETICCTPCT in the last 72 hours. Urinalysis    Component Value Date/Time   COLORURINE YELLOW 04/13/2016 [place] 04/13/2016 1252   LABSPEC 1.010 04/13/2016 1252   PHURINE 7.5 04/13/2016 1252   GLUCOSEU NEGATIVE 04/13/2016 1252   HGBUR NEGATIVE 04/13/2016 1252   BILIRUBINUR NEGATIVE 04/13/2016 1252   KETONESUR 15 (A) 04/13/2016 1252   PROTEINUR NEGATIVE 04/13/2016 1252   UROBILINOGEN 0.2 03/30/2015 0852   NITRITE NEGATIVE 04/13/2016 1252   LEUKOCYTESUR NEGATIVE 04/13/2016 1252   Sepsis Labs Invalid input(s): PROCALCITONIN,  WBC,  LACTICIDVEN    Time coordinating discharge: Over 30 minutes  SIGNED:  Vernell Leep, MD, FACP, FHM. [facility] Pager [phone]  If 7PM-7AM, please contact night-coverage [email or web address] Password [facility] 04/18/2016, 3:56 PM

## 2016-04-18 NOTE — Progress Notes (Addendum)
Patient tolerated lunch. Reports some pain in RUQ and back managed with prn tylenol. Patient denied need for scheduled tramadol at noon. Patient did have an episode of a loose BM following breakfast. Dr. Algis Liming paged to make aware of updates.   MD aware. Surgery paged to clarify orders to d/c drain. Orders placed.

## 2016-04-18 NOTE — Progress Notes (Signed)
Laurie Terry to be D/C'd Home per MD order.  Discussed with the patient and all questions fully answered.  VSS. Dressing clean dry and intact where JP drain was discontinued. Lap sites dry and intact with steri strips in place. IV catheter discontinued intact. Site without signs and symptoms of complications. Dressing and pressure applied.  An After Visit Summary was printed and given to the patient. Patient received prescription.  D/c education completed with patient/family including follow up instructions, medication list, d/c activities limitations if indicated, with other d/c instructions as indicated by MD - patient able to verbalize understanding, all questions fully answered.   Patient instructed to return to ED, call 911, or call MD for any changes in condition.   Patient to be escorted via [place], and D/C home via private auto.  L'ESPERANCE, Sheena Donegan C 04/18/2016 4:16 PM

## 2016-04-21 LAB — TOTAL BILIRUBIN, BODY FLUID: TOTBILIFLUID: 2.6 mg/dL

## 2016-04-23 LAB — AEROBIC/ANAEROBIC CULTURE W GRAM STAIN (SURGICAL/DEEP WOUND)

## 2016-04-23 LAB — AEROBIC/ANAEROBIC CULTURE (SURGICAL/DEEP WOUND): CULTURE: NO GROWTH

## 2016-05-03 ENCOUNTER — Ambulatory Visit
Admission: RE | Admit: 2016-05-03 | Discharge: 2016-05-03 | Disposition: A | Discharge status: Home / Self Care | Payer: 59 | Source: Ambulatory Visit | Attending: General Surgery | Admitting: General Surgery

## 2016-05-03 ENCOUNTER — Ambulatory Visit
Admission: RE | Admit: 2016-05-03 | Discharge: 2016-05-03 | Disposition: A | Discharge status: Home / Self Care | Payer: 59 | Source: Ambulatory Visit | Attending: Gastroenterology | Admitting: Gastroenterology

## 2016-05-03 ENCOUNTER — Other Ambulatory Visit: Payer: Self-pay | Admitting: Gastroenterology

## 2016-05-03 ENCOUNTER — Other Ambulatory Visit: Payer: Self-pay | Admitting: General Surgery

## 2016-05-03 DIAGNOSIS — K851 Biliary acute pancreatitis without necrosis or infection: Secondary | ICD-10-CM

## 2016-05-03 DIAGNOSIS — R0602 Shortness of breath: Secondary | ICD-10-CM

## 2016-05-29 ENCOUNTER — Ambulatory Visit (INDEPENDENT_AMBULATORY_CARE_PROVIDER_SITE_OTHER): Payer: 59 | Admitting: Podiatry

## 2016-05-29 ENCOUNTER — Ambulatory Visit (INDEPENDENT_AMBULATORY_CARE_PROVIDER_SITE_OTHER): Payer: 59

## 2016-05-29 ENCOUNTER — Encounter: Payer: Self-pay | Admitting: Podiatry

## 2016-05-29 DIAGNOSIS — M79671 Pain in right foot: Secondary | ICD-10-CM

## 2016-05-29 DIAGNOSIS — M79672 Pain in left foot: Secondary | ICD-10-CM

## 2016-05-29 DIAGNOSIS — M21619 Bunion of unspecified foot: Secondary | ICD-10-CM | POA: Diagnosis not present

## 2016-05-30 NOTE — Progress Notes (Signed)
Subjective:     Patient ID: Laurie Terry, female   DOB: 1966/08/08, 50 y.o.   MRN: [id number]  HPI patient presents with painful bunions of both feet and states she's tried wider shoes she's tried soaks and other modalities and needs to get them fixed due to long-term duration and pain   Review of Systems  All other systems reviewed and are negative.      Objective:   Physical Exam  Constitutional: She is oriented to person, place, and time.  Cardiovascular: Intact distal pulses.   Musculoskeletal: Normal range of motion.  Neurological: She is oriented to person, place, and time.  Skin: Skin is warm.  Nursing note and vitals reviewed.  Neurovascular status intact muscle strength adequate range of motion within normal limits with patient found to have redness and hyperostosis medial aspect first metatarsal head bilateral with deviation the hallux against second toe and pain when palpated. Patient's found to have good digital perfusion is well oriented 3 with long-term family history of condition and failure to respond to conservative care     Assessment:     Chronic structural bunion deformity bilateral    Plan:     H&P conditions reviewed and discussed alternatives. Patient's opted for surgical intervention and we've recommended distal osteotomies first metatarsal left with fixation as the best solution. She wants both done at the same time and is can be accomplished but it does increase risk slightly which she understands. Will reappoint for consult in his tenably scheduled for surgery  X-ray report indicates elevation of the intermetatarsal angle of approximate 15 with no indications of other pathology

## 2016-06-03 ENCOUNTER — Encounter: Payer: Self-pay | Admitting: Gynecology

## 2016-06-03 ENCOUNTER — Ambulatory Visit (INDEPENDENT_AMBULATORY_CARE_PROVIDER_SITE_OTHER): Payer: 59 | Admitting: Gynecology

## 2016-06-03 VITALS — BP 120/74 | Ht 64.0 in | Wt 143.0 lb

## 2016-06-03 DIAGNOSIS — Z30431 Encounter for routine checking of intrauterine contraceptive device: Secondary | ICD-10-CM

## 2016-06-03 DIAGNOSIS — Z01419 Encounter for gynecological examination (general) (routine) without abnormal findings: Secondary | ICD-10-CM

## 2016-06-03 LAB — URINALYSIS W MICROSCOPIC + REFLEX CULTURE
BILIRUBIN URINE: NEGATIVE
Bacteria, UA: NONE SEEN [HPF]
Casts: NONE SEEN [LPF]
Crystals: NONE SEEN [HPF]
Glucose, UA: NEGATIVE
Hgb urine dipstick: NEGATIVE
KETONES UR: NEGATIVE
Leukocytes, UA: NEGATIVE
NITRITE: NEGATIVE
PH: 6 (ref 5.0–8.0)
Protein, ur: NEGATIVE
RBC / HPF: NONE SEEN RBC/HPF (ref <=2)
SPECIFIC GRAVITY, URINE: 1.003 (ref 1.001–1.035)
Squamous Epithelial / LPF: NONE SEEN [HPF] (ref <=5)
WBC UA: NONE SEEN WBC/HPF (ref <=5)
Yeast: NONE SEEN [HPF]

## 2016-06-03 NOTE — Patient Instructions (Signed)

## 2016-06-03 NOTE — Progress Notes (Signed)
    Laurie Terry 08-30-1966 [id number]        50 y.o.  [id number]  for annual exam.  Doing well without complaints  Past medical history,surgical history, problem list, medications, allergies, family history and social history were all reviewed and documented as reviewed in the EPIC chart.  ROS:  Performed with pertinent positives and negatives included in the history, assessment and plan.   Additional significant findings :  None   Exam: [name] assistant Vitals:   06/03/16 0832  BP: 120/74  Weight: 143 lb (64.9 kg)  Height: 5\' 4"  (1.626 m)   Body mass index is 24.55 kg/m.  General appearance:  Normal affect, orientation and appearance. Skin: Grossly normal HEENT: Without gross lesions.  No cervical or supraclavicular adenopathy. Thyroid normal.  Lungs:  Clear without wheezing, rales or rhonchi Cardiac: RR, without RMG Abdominal:  Soft, nontender, without masses, guarding, rebound, organomegaly or hernia Breasts:  Examined lying and sitting without masses, retractions, discharge or axillary adenopathy. Pelvic:  Ext/BUS/Vagina normal  Cervix normal. IUD string visualized  Uterus anteverted, normal size, shape and contour, midline and mobile nontender   Adnexa without masses or tenderness    Anus and perineum normal   Rectovaginal normal sphincter tone without palpated masses or tenderness.    Assessment/Plan:  50 y.o. [id number] female for annual exam without menses, Mirena IUD.   1. Mirena IUD 05/2012. Doing well without bleeding. IUD string visualized. Discussed need to remove or replace next year. No symptoms to suggest menopause. 2. Pap smear/HPV 2015 negative. No Pap smear done today. History of LGSIL 2007 with normal Pap smears afterwards. Plan repeat Pap smear approaching 5 year interval per current screening guidelines. 3. Mammography 01/2016. Continue with annual mammography when due. SBE monthly reviewed. 4. Colonoscopy 2017. Repeat at their recommended  interval. 5. Health maintenance. Patient reports recent routine blood work done. Follow up 1 year, sooner as needed.   [name] MD, 9:03 AM 06/03/2016

## 2016-09-02 ENCOUNTER — Ambulatory Visit (INDEPENDENT_AMBULATORY_CARE_PROVIDER_SITE_OTHER): Payer: 59 | Admitting: Podiatry

## 2016-09-02 DIAGNOSIS — M21619 Bunion of unspecified foot: Secondary | ICD-10-CM | POA: Diagnosis not present

## 2016-09-02 DIAGNOSIS — M21621 Bunionette of right foot: Secondary | ICD-10-CM

## 2016-09-02 NOTE — Patient Instructions (Signed)

## 2016-09-03 NOTE — Progress Notes (Signed)
Subjective:     Patient ID: Laurie Terry, female   DOB: [birth year], 50 y.o.   MRN: [id number]  HPI patient presents to get the bunion fixed on both feet and states a lot of pain in the outside of the right foot. She presents for consultation for surgery scheduled in approximately 2 weeks   Review of Systems     Objective:   Physical Exam Neurovascular status intact muscle strength adequate range of motion within normal limits with patient found to have hyperostosis with redness first metatarsal bilateral and patient's noted to have redness and pain fifth metatarsal right with patient noted to have redness around this joint surface also. 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 I've recommended osteotomies distal first metatarsal bilateral and fifth metatarsal osteotomy with screw. I allowed patient to read consent form reviewing alternative treatments complications and the fact there is no guarantee as far success of surgery and all possible complications can occur. Patient wants surgery understanding everything as outlined signs consent form after extensive review and is scheduled for surgery. Patient will be seen back with any questions prior to procedure and I did dispense bilateral air fracture walker to utilize at this time to keep stress off her feet postoperatively and she does no sharp to be very careful with them for the first couple weeks and she is completely understanding of this. Also understands total recovery take approximate 6 months

## 2016-09-17 ENCOUNTER — Encounter: Payer: Self-pay | Admitting: Podiatry

## 2016-09-17 DIAGNOSIS — M2011 Hallux valgus (acquired), right foot: Secondary | ICD-10-CM | POA: Diagnosis not present

## 2016-09-17 DIAGNOSIS — M2012 Hallux valgus (acquired), left foot: Secondary | ICD-10-CM | POA: Diagnosis not present

## 2016-09-17 DIAGNOSIS — M21541 Acquired clubfoot, right foot: Secondary | ICD-10-CM | POA: Diagnosis not present

## 2016-09-20 ENCOUNTER — Telehealth: Payer: Self-pay | Admitting: *Deleted

## 2016-09-20 NOTE — Telephone Encounter (Signed)
I attempted to call patient to see how she was doing.  I left a message to elevate as much as possible.  Take pain medication as needed  Call the on-call pager if you have any questions or concerns.  Our office will be closed on Monday.

## 2016-09-20 NOTE — Telephone Encounter (Signed)
Pt states had bunion surgery 09/17/2016 both feet and one is popping. I informed pt that I had spoken with Dr. Paulla Dolly and he stated that was not unusual. I also instructed pt not to weight bear, dangle or have her feet below her heart more than 15 mins/hour, remain in the boot at all times and to keep dressings in place until seen for 1st POV. Pt states understanding.

## 2016-09-23 HISTORY — PX: BUNIONECTOMY: SHX129

## 2016-09-27 ENCOUNTER — Encounter: Payer: Self-pay | Admitting: Podiatry

## 2016-09-27 ENCOUNTER — Ambulatory Visit (INDEPENDENT_AMBULATORY_CARE_PROVIDER_SITE_OTHER): Payer: 59

## 2016-09-27 ENCOUNTER — Ambulatory Visit (INDEPENDENT_AMBULATORY_CARE_PROVIDER_SITE_OTHER): Payer: 59 | Admitting: Podiatry

## 2016-09-27 VITALS — Temp 98.1°F

## 2016-09-27 DIAGNOSIS — M2011 Hallux valgus (acquired), right foot: Secondary | ICD-10-CM

## 2016-09-27 DIAGNOSIS — M21621 Bunionette of right foot: Secondary | ICD-10-CM

## 2016-09-30 NOTE — Progress Notes (Signed)
Subjective:     Patient ID: Laurie Terry, female   DOB: [birth year], 51 y.o.   MRN: [id number]  HPI patient states she's doing very well with both feet and is walking with minimal discomfort in boots bilateral   Review of Systems     Objective:   Physical Exam Neurovascular status intact negative Homan sign was noted bilateral with patient having excellent healing incision site first fifth metatarsal right and first metatarsal left with wound edges well coapted and no drainage noted with good alignment noted and range of motion    Assessment:     Doing well postoperatively structural bunion correction and metatarsal osteotomy fifth right with good alignment and fixation    Plan:     H&P conditions reviewed and recommended into need elevation compression immobilization at this time. Patient will be seen back in 2 weeks to recheck or earlier if needed and is encouraged to continue to keep the feet elevated and always wear the boot when walking  X-ray indicated pins screws in place with slight movement but should be fine from where it is with no indications of significant movement

## 2016-10-03 NOTE — Progress Notes (Signed)
DOS 12.26.2017 [place] Bunionectomy (cutting and moving bone) with Pin Fixation Both Feet; 5th Metatarsal Osteotomy with Screw Right

## 2016-10-15 ENCOUNTER — Telehealth: Payer: Self-pay | Admitting: *Deleted

## 2016-10-15 NOTE — Telephone Encounter (Signed)
Pt states she started wearing the surgical shoes yesterday and now has more pain than the whole surgery and new bruising. I asked the pt if she felt she had over done walking in the surgical shoe. Pt states she had only been in the surgical shoes for 1 hour at work and 1 hour at home and not even walking the whole time. Pt states she has the ace wrapped as Dr. Paulla Dolly showed her and I told her to cut it in half and that may help with the bulkiness of the ace and also not have the foot position so heel down and forefoot up which may cause discomfort and may help, but I felt she would benefit from going back in to the surgical boot until seen 10/18/2016 and pt agreed. I told her I would inform Dr. Paulla Dolly and call again.

## 2016-10-18 ENCOUNTER — Ambulatory Visit (INDEPENDENT_AMBULATORY_CARE_PROVIDER_SITE_OTHER): Payer: 59

## 2016-10-18 ENCOUNTER — Ambulatory Visit (INDEPENDENT_AMBULATORY_CARE_PROVIDER_SITE_OTHER): Payer: 59 | Admitting: Podiatry

## 2016-10-18 DIAGNOSIS — M2011 Hallux valgus (acquired), right foot: Secondary | ICD-10-CM | POA: Diagnosis not present

## 2016-10-18 DIAGNOSIS — M21621 Bunionette of right foot: Secondary | ICD-10-CM | POA: Diagnosis not present

## 2016-10-19 NOTE — Progress Notes (Signed)
Subjective:     Patient ID: Laurie Terry, female   DOB: 07-17-1966, 51 y.o.   MRN: [id number]  HPI patient states I'm doing well but still having some discomfort and I was concerned because my left foot had one night and shooting pain   Review of Systems     Objective:   Physical Exam Neurovascular status intact with patient's surgical sites healing well with wound edges well coapted good alignment and no indications of significant pathology    Assessment:     Recovering well from structural bunion correction bilateral tailor's bunion right    Plan:     Reviewed condition and x-rays and encouraged increased activity but continued immobilization compression  X-rays indicated that the osteotomies are healing well pins are in place good alignment noted

## 2016-10-22 ENCOUNTER — Encounter: Payer: Self-pay | Admitting: Podiatry

## 2016-11-15 ENCOUNTER — Ambulatory Visit (INDEPENDENT_AMBULATORY_CARE_PROVIDER_SITE_OTHER): Payer: Self-pay | Admitting: Podiatry

## 2016-11-15 ENCOUNTER — Ambulatory Visit (INDEPENDENT_AMBULATORY_CARE_PROVIDER_SITE_OTHER): Payer: 59

## 2016-11-15 ENCOUNTER — Encounter: Payer: Self-pay | Admitting: Podiatry

## 2016-11-15 ENCOUNTER — Ambulatory Visit: Payer: 59

## 2016-11-15 DIAGNOSIS — M21621 Bunionette of right foot: Secondary | ICD-10-CM | POA: Diagnosis not present

## 2016-11-15 DIAGNOSIS — M21622 Bunionette of left foot: Secondary | ICD-10-CM

## 2016-11-15 DIAGNOSIS — M79673 Pain in unspecified foot: Secondary | ICD-10-CM

## 2016-11-15 DIAGNOSIS — Z9889 Other specified postprocedural states: Secondary | ICD-10-CM

## 2016-11-16 NOTE — Progress Notes (Signed)
Subjective:     Patient ID: Laurie Terry, female   DOB: Dec 03, 1965, 50 y.o.   MRN: [id number]  HPI patient states she's doing well with her feet with good alignment noted and minimal edema. States that she's back and his shoes but is not doing any kind of intense activity   Review of Systems     Objective:   Physical Exam Neurovascular status intact negative Homan sign was noted with well coapted incision site right and left first metatarsal and fifth metatarsal right with no significant edema and no drainage    Assessment:     Doing well post forefoot reconstruction bilateral    Plan:     H&P conditions reviewed and recommended gradual increase in activity levels over the next approximate 4 weeks with continued reduced activity and not doing anything traumatic  X-rays indicate osteotomies are healing well with no indications of movement and pins screw in place

## 2016-11-27 DIAGNOSIS — Z95810 Presence of automatic (implantable) cardiac defibrillator: Secondary | ICD-10-CM | POA: Diagnosis not present

## 2016-11-27 DIAGNOSIS — I255 Ischemic cardiomyopathy: Secondary | ICD-10-CM | POA: Diagnosis not present

## 2016-11-27 DIAGNOSIS — I472 Ventricular tachycardia: Secondary | ICD-10-CM | POA: Diagnosis not present

## 2016-11-27 DIAGNOSIS — I5022 Chronic systolic (congestive) heart failure: Secondary | ICD-10-CM | POA: Diagnosis not present

## 2016-12-13 DIAGNOSIS — N183 Chronic kidney disease, stage 3 (moderate): Secondary | ICD-10-CM | POA: Diagnosis not present

## 2016-12-13 DIAGNOSIS — Z794 Long term (current) use of insulin: Secondary | ICD-10-CM | POA: Diagnosis not present

## 2016-12-13 DIAGNOSIS — E1122 Type 2 diabetes mellitus with diabetic chronic kidney disease: Secondary | ICD-10-CM | POA: Diagnosis not present

## 2017-01-03 ENCOUNTER — Ambulatory Visit (INDEPENDENT_AMBULATORY_CARE_PROVIDER_SITE_OTHER): Payer: 59

## 2017-01-03 ENCOUNTER — Ambulatory Visit (INDEPENDENT_AMBULATORY_CARE_PROVIDER_SITE_OTHER): Payer: Self-pay | Admitting: Podiatry

## 2017-01-03 DIAGNOSIS — Z9889 Other specified postprocedural states: Secondary | ICD-10-CM | POA: Diagnosis not present

## 2017-01-03 DIAGNOSIS — M2011 Hallux valgus (acquired), right foot: Secondary | ICD-10-CM

## 2017-01-03 DIAGNOSIS — M21619 Bunion of unspecified foot: Secondary | ICD-10-CM | POA: Diagnosis not present

## 2017-01-03 DIAGNOSIS — M21621 Bunionette of right foot: Secondary | ICD-10-CM

## 2017-01-05 NOTE — Progress Notes (Signed)
Subjective:     Patient ID: Laurie Terry, female   DOB: [birth year], 51 y.o.   MRN: [id number]  HPI patient states she continues to improve and is wearing shoe gear relatively normally and doing activities   Review of Systems     Objective:   Physical Exam Neurovascular status intact negative Homan sign was noted with patient's first MPJ healing well bilateral fifth MPJ right healing well with mild edema but no discomfort when palpated. Range of motion within normal limits    Assessment:     Patient's doing well with recovery from surgery on    Plan:     H&P conditions reviewed and I explained that the incisions could should continue to lighten over time. I then went ahead reviewed her x-rays and return to normal activity as best as possible at this time. Reappoint 8 weeks or earlier if needed  X-ray indicated the osteotomies are healing well with some healing still to go around the fifth metatarsal right but relatively stable

## 2017-01-14 DIAGNOSIS — I5022 Chronic systolic (congestive) heart failure: Secondary | ICD-10-CM | POA: Diagnosis not present

## 2017-01-14 DIAGNOSIS — Z95810 Presence of automatic (implantable) cardiac defibrillator: Secondary | ICD-10-CM | POA: Diagnosis not present

## 2017-01-21 ENCOUNTER — Other Ambulatory Visit: Payer: Self-pay | Admitting: Gynecology

## 2017-01-21 DIAGNOSIS — Z1231 Encounter for screening mammogram for malignant neoplasm of breast: Secondary | ICD-10-CM

## 2017-02-07 DIAGNOSIS — E1122 Type 2 diabetes mellitus with diabetic chronic kidney disease: Secondary | ICD-10-CM | POA: Diagnosis not present

## 2017-02-07 DIAGNOSIS — N183 Chronic kidney disease, stage 3 (moderate): Secondary | ICD-10-CM | POA: Diagnosis not present

## 2017-02-07 DIAGNOSIS — Z794 Long term (current) use of insulin: Secondary | ICD-10-CM | POA: Diagnosis not present

## 2017-02-20 ENCOUNTER — Ambulatory Visit
Admission: RE | Admit: 2017-02-20 | Discharge: 2017-02-20 | Disposition: A | Discharge status: Home / Self Care | Payer: 59 | Source: Ambulatory Visit | Attending: Gynecology | Admitting: Gynecology

## 2017-02-20 DIAGNOSIS — Z1231 Encounter for screening mammogram for malignant neoplasm of breast: Secondary | ICD-10-CM | POA: Diagnosis not present

## 2017-02-21 DIAGNOSIS — I5022 Chronic systolic (congestive) heart failure: Secondary | ICD-10-CM | POA: Diagnosis not present

## 2017-02-26 DIAGNOSIS — I5022 Chronic systolic (congestive) heart failure: Secondary | ICD-10-CM | POA: Diagnosis not present

## 2017-02-26 DIAGNOSIS — Z95810 Presence of automatic (implantable) cardiac defibrillator: Secondary | ICD-10-CM | POA: Diagnosis not present

## 2017-02-26 DIAGNOSIS — I255 Ischemic cardiomyopathy: Secondary | ICD-10-CM | POA: Diagnosis not present

## 2017-03-21 DIAGNOSIS — Z131 Encounter for screening for diabetes mellitus: Secondary | ICD-10-CM | POA: Diagnosis not present

## 2017-03-21 DIAGNOSIS — Z136 Encounter for screening for cardiovascular disorders: Secondary | ICD-10-CM | POA: Diagnosis not present

## 2017-04-01 DIAGNOSIS — I5022 Chronic systolic (congestive) heart failure: Secondary | ICD-10-CM | POA: Diagnosis not present

## 2017-04-01 DIAGNOSIS — Z95810 Presence of automatic (implantable) cardiac defibrillator: Secondary | ICD-10-CM | POA: Diagnosis not present

## 2017-04-03 DIAGNOSIS — L659 Nonscarring hair loss, unspecified: Secondary | ICD-10-CM | POA: Diagnosis not present

## 2017-04-03 DIAGNOSIS — Z0000 Encounter for general adult medical examination without abnormal findings: Secondary | ICD-10-CM | POA: Diagnosis not present

## 2017-04-30 IMAGING — MR MR ABDOMEN WO/W CM MRCP
11 of 20 series · 19 of 48 positions shown · IV contrast (multihance)
Comparison: MRI of the abdomen 04/08/2016. CT the abdomen
04/12/2016. Nuclear medicine hepatobiliary scan 04/15/2016.

CLINICAL DATA: 50-year-old female status post laparoscopic
cholecystectomy. Following discharge from hospital, patient
experienced pressure/pain in the abdomen with drainage through the
surgical drain. Evaluate for potential bile leak or other
postoperative complication.

EXAM:
MRI ABDOMEN WITHOUT AND WITH CONTRAST (INCLUDING MRCP)
TECHNIQUE: Multiplanar multisequence MR imaging of the abdomen was performed
both before and after the administration of intravenous contrast.
Heavily T2-weighted images of the biliary and pancreatic ducts were
obtained, and three-dimensional MRCP images were rendered by post
processing.
CONTRAST:  15mL MULTIHANCE GADOBENATE DIMEGLUMINE 529 MG/ML IV SOLN

[Series 3: T2 · axial · 5.0mm · 0.70mm/px · z∈[-100,+145]mm · 3 of 50 slices shown (1 of 3)]
[im 1/50]
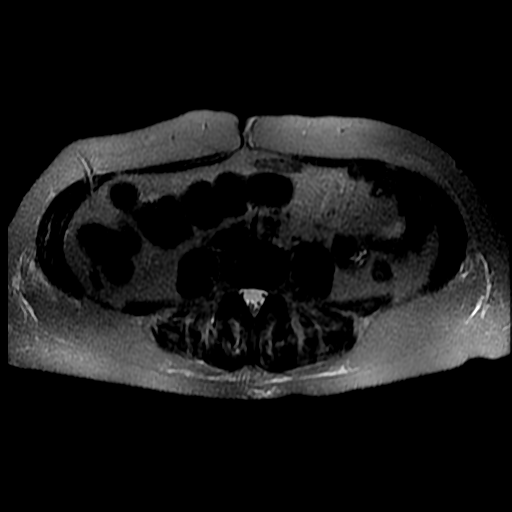
[im 25/50]
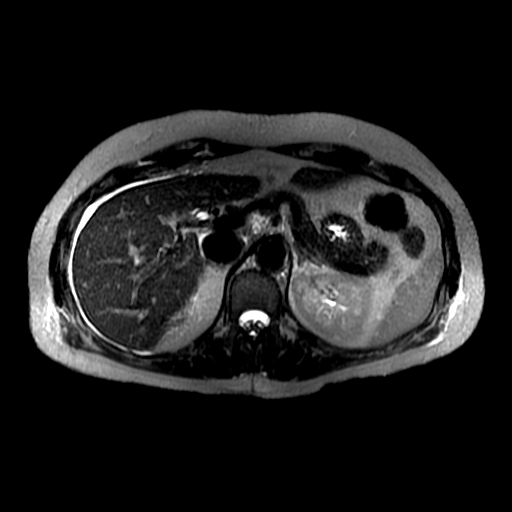
[im 50/50]
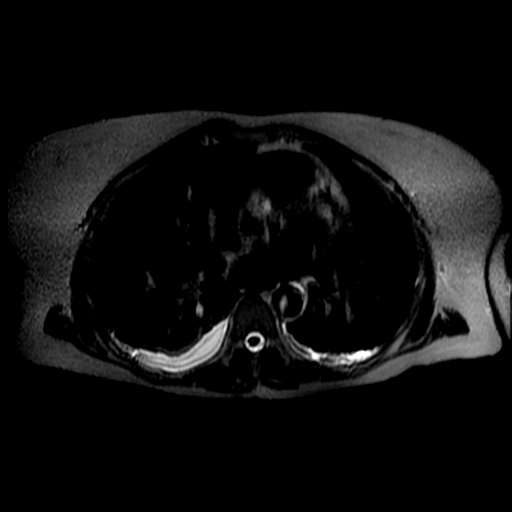

[Series 4: T2 · coronal · 5.0mm · 0.80mm/px · 1 of 30 slices shown (2 of 3)]
[im 1/30]
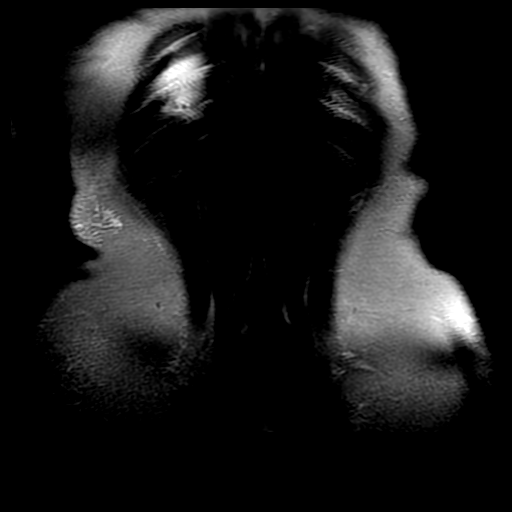

[Series 6: DWI b500 · axial · 6.0mm · 1.56mm/px · z∈[-83,+159]mm · 2 of 64 slices shown]
[im 1/64]
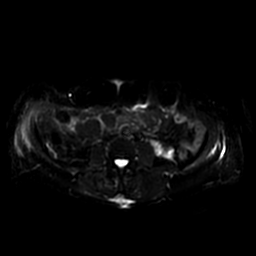
[im 64/64]
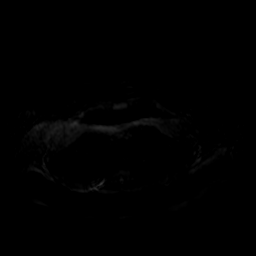

[Series 7: MRCP · coronal · 2.0mm · 0.70mm/px · 1 of 25 slices shown (1 of 2)]
[im 1/25]
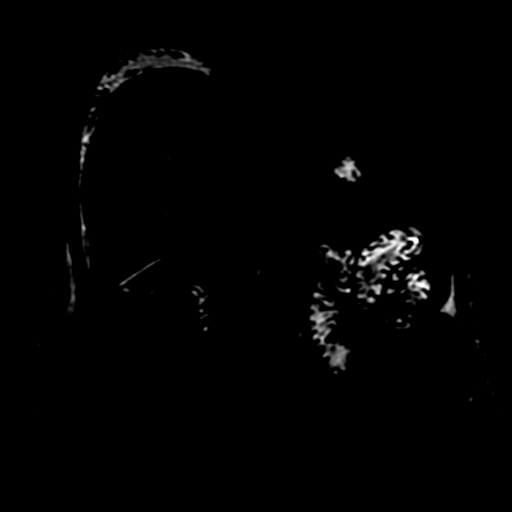

[Series 8: T2 · axial · 5.0mm · 0.64mm/px · z∈[-81,+144]mm · 2 of 46 slices shown (3 of 3)]
[im 1/46]
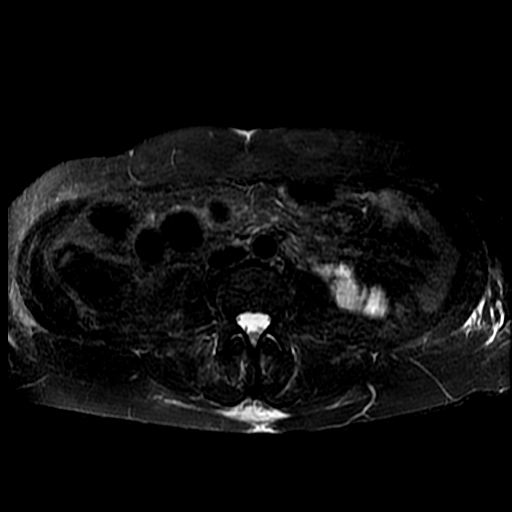
[im 46/46]
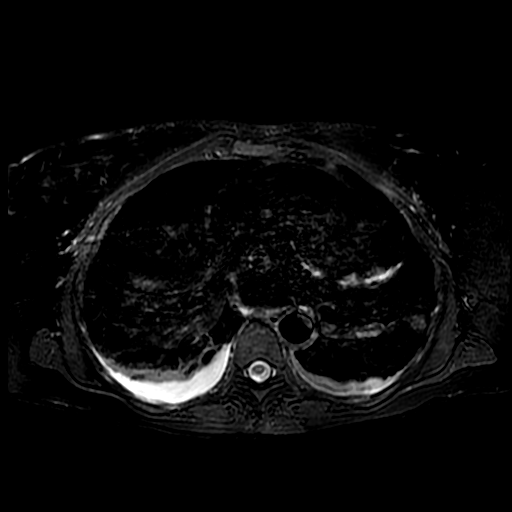

[Series 11: ax dualecho · axial · 5.0mm · 0.68mm/px · z∈[-104,+131]mm · 4 of 96 slices shown]
[im 1/96]
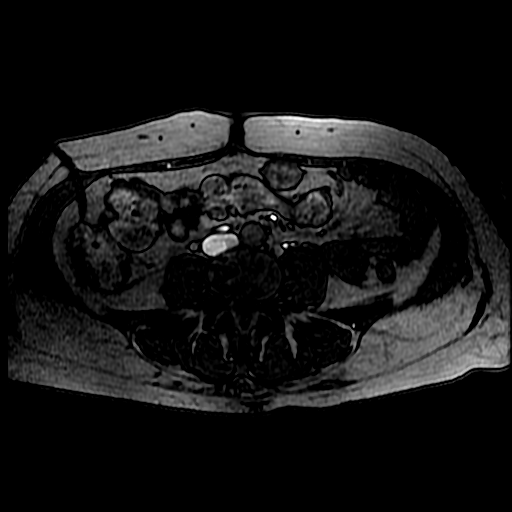
[im 32/96]
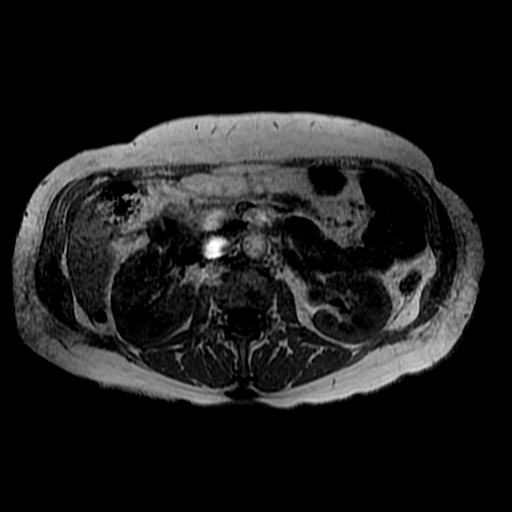
[im 64/96]
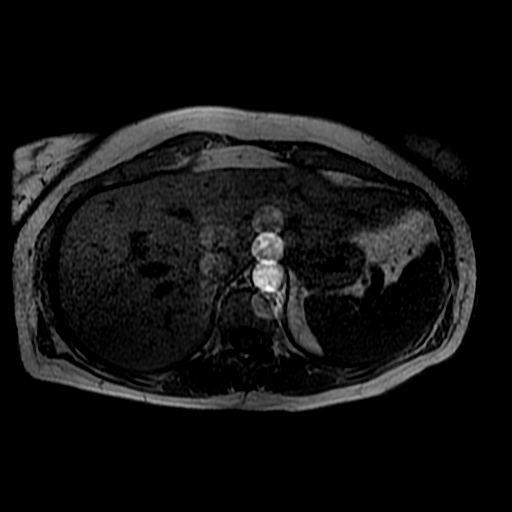
[im 96/96]
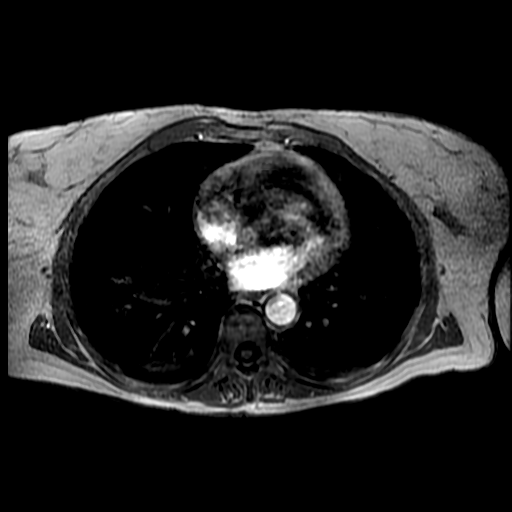

[Series 12: MRCP · coronal · 50.0mm · 0.70mm/px · 1 of 6 slices shown (2 of 2)]
[im 1/6]
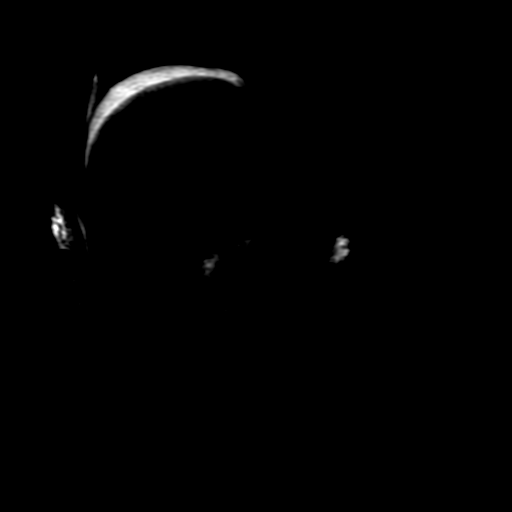

[Series 14: T1 dynamic post-contrast · coronal · 5.0mm · 0.78mm/px · 2 of 64 slices shown]
[im 1/64]
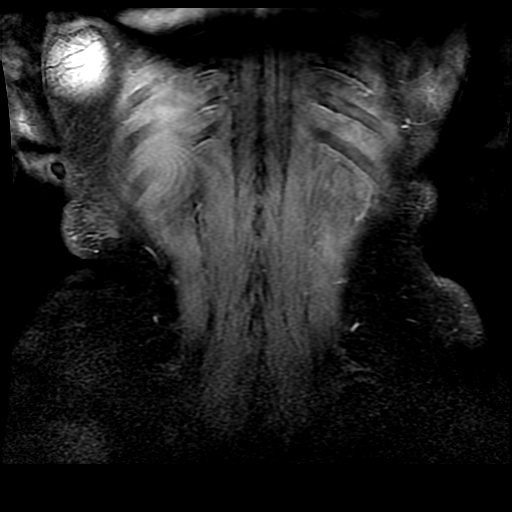
[im 64/64]
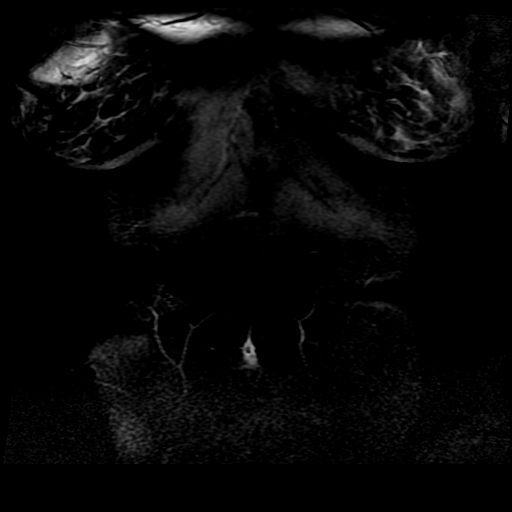

[Series 502: processed images · axial · 1.6mm · 0.39mm/px · 1 of 6 slices shown]
[im 1/6]
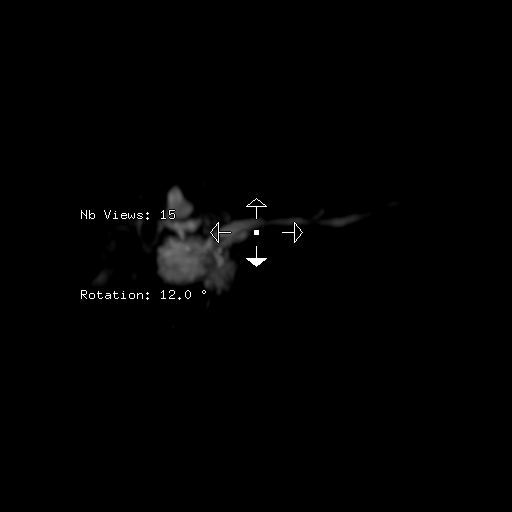

[Series 600: DWI · axial · 6.0mm · 1.56mm/px · 1 of 31 slices shown]
[im 1/31]
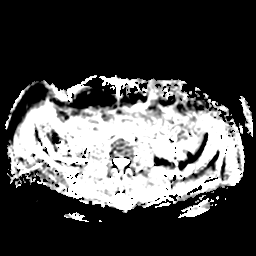

[Series 1300: T1 dynamic · axial · 5.0mm · 0.74mm/px · 1 of 88 slices shown]
[im 1/88]
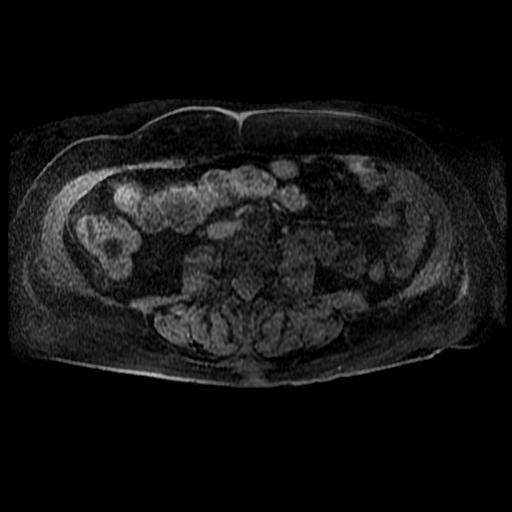

[19 of 48 positions shown; findings below may reference images not displayed]

FINDINGS: Lower chest: Small right and trace left pleural effusions lying
dependently. Increased signal intensity in the dependent portions of
the lower lobes of the lungs bilaterally, likely related to passive
subsegmental atelectasis.

Hepatobiliary: No cystic or solid hepatic lesions. No intra or
extrahepatic biliary ductal dilatation. Common bile duct measures 5
mm in the porta hepatis. Status post cholecystectomy. Minimal T2
signal intensity in the gallbladder fossa, compatible with resolving
postoperative inflammation. However, along the anterior aspect of
the liver adjacent to the gallbladder fossa there is a small
component of fluid which does appear to be organized with a thick
rim of enhancement, which measures approximately 3.8 x 0.9 x 4.3 cm
(axial image 52 of series 1997 and coronal image 56 of series 14),
suspicious for a small developing abscess. MRCP images are
suboptimal secondary to respiratory motion and small volume of
ascites. With these limitations in mind the distal common bile duct
is poorly demonstrated, but no definite choledocholithiasis is
identified.

Pancreas: No pancreatic mass. No pancreatic ductal dilatation. No
pancreatic or peripancreatic fluid or inflammatory changes.

Spleen: Unremarkable.

Adrenals/Urinary Tract: Sub cm lesions in the kidneys bilaterally
are T1 hypointense, T2 hyperintense, and do not enhance, compatible
with tiny simple cysts. No hydroureteronephrosis in the visualized
abdomen. Bilateral adrenal glands are normal in appearance.

Stomach/Bowel: Visualized portions are unremarkable.

Vascular/Lymphatic: No aneurysm identified in the visualized
abdominal vasculature. No lymphadenopathy noted in the abdomen.

Other: Surgical drain in the right side of the abdomen extending
into the subhepatic recess beneath segments 6 and 7, with tip
terminating in close proximity to the right adrenal gland. No
surrounding fluid collection adjacent to the drainage catheter.
There is a small amount of perihepatic ascites in the
subdiaphragmatic region adjacent to the right lobe of the liver,
which does not appear to be organized. There is a small organized
fluid collection along the anterior aspect of the liver adjacent to
the gallbladder fossa (as discussed above), which is suspicious for
a small developing abscess.

Musculoskeletal: No aggressive appearing osseous lesions are noted
in the visualized portions of the skeleton.
IMPRESSION: 1. Postoperative changes related to recent cholecystectomy, with
resolving postoperative inflammation in the region of the
gallbladder fossa. Immediately anterior to the liver adjacent to the
gallbladder fossa there is a small organized rim enhancing fluid
collection, concerning for a developing abscess, which currently
measures 3.8 x 0.9 x 4.3 cm (as detailed above).
2. Small volume of perihepatic ascites in the right subdiaphragmatic
region.
3. Small right and trace left pleural effusions with mild dependent
subsegmental passive atelectasis.
4. Additional findings, as above.
These results will be called to the ordering clinician or
representative by the Radiologist Assistant, and communication
documented in the PACS or zVision Dashboard.

## 2017-05-01 DIAGNOSIS — H9390 Unspecified disorder of ear, unspecified ear: Secondary | ICD-10-CM | POA: Diagnosis not present

## 2017-05-01 DIAGNOSIS — N182 Chronic kidney disease, stage 2 (mild): Secondary | ICD-10-CM | POA: Diagnosis not present

## 2017-05-01 DIAGNOSIS — H9193 Unspecified hearing loss, bilateral: Secondary | ICD-10-CM | POA: Diagnosis not present

## 2017-05-01 IMAGING — CT CT GUIDANCE NEEDLE PLACEMENT
1 of 3 series · 15 of 32 positions shown, 19 images · non-contrast
Comparison: none

INDICATION: 50-year-old with history of cholecystectomy and concern for bile
leak. Recent MRI demonstrated a small amount of perihepatic fluid.
Request for a CT-guided aspiration or drainage to exclude an
abscess.

EXAM:
CT-GUIDED ASPIRATION OF PERIHEPATIC FLUID COLLECTION
MEDICATIONS:
None
ANESTHESIA/SEDATION:
COMPLICATIONS:
None immediate.
TECHNIQUE: Informed written consent was obtained from the patient after a
thorough discussion of the procedural risks, benefits and
alternatives. All questions were addressed. A timeout was performed
prior to the initiation of the procedure.

[Series 2: i-spiral 5.0 b40f · axial · 0.70mm/px · z∈[-263,-32]mm · 15 of 73 slices shown, 19 images]
[im 4/73  soft-tissue]
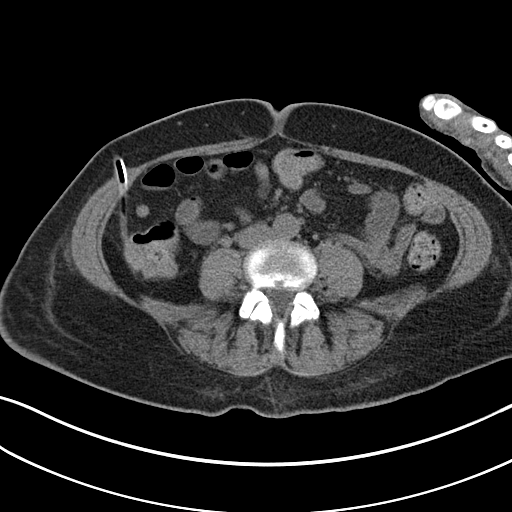
[im 4/73  bone]
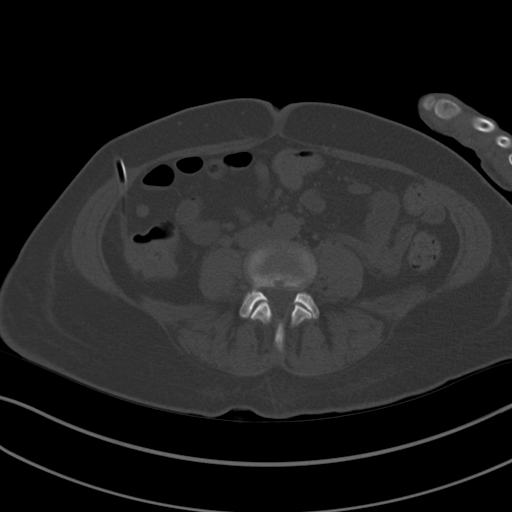
[im 10/73  soft-tissue]
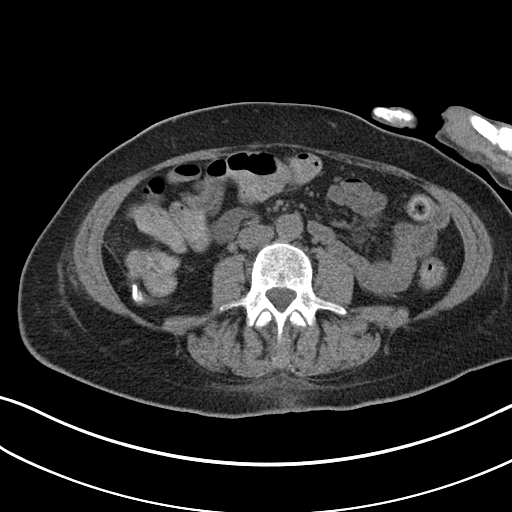
[im 16/73  soft-tissue]
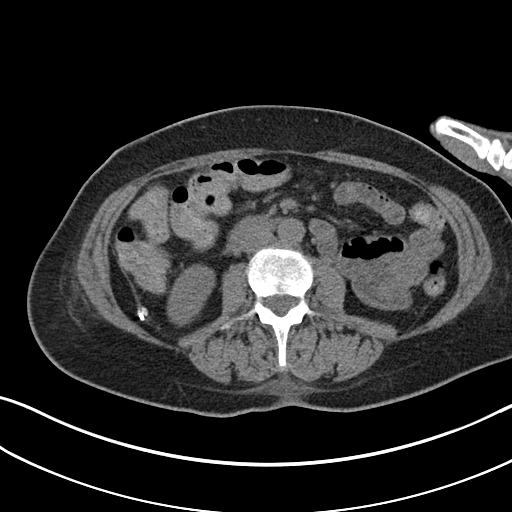
[im 22/73  soft-tissue]
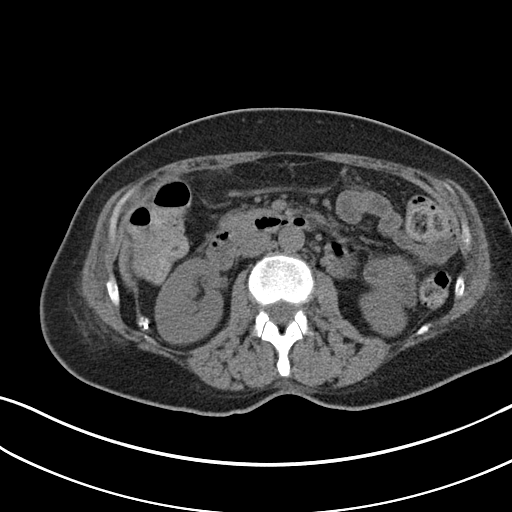
[im 25/73  soft-tissue]
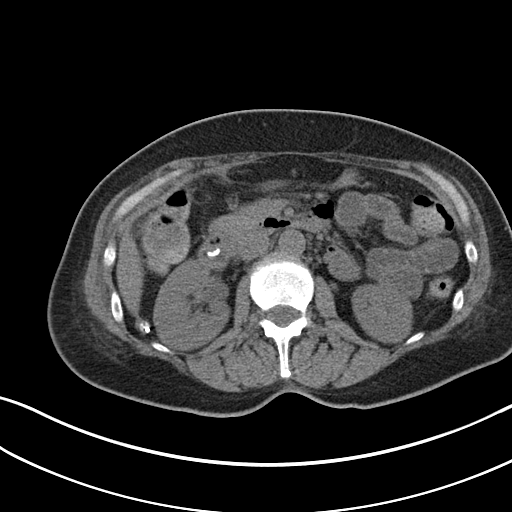
[im 31/73  soft-tissue]
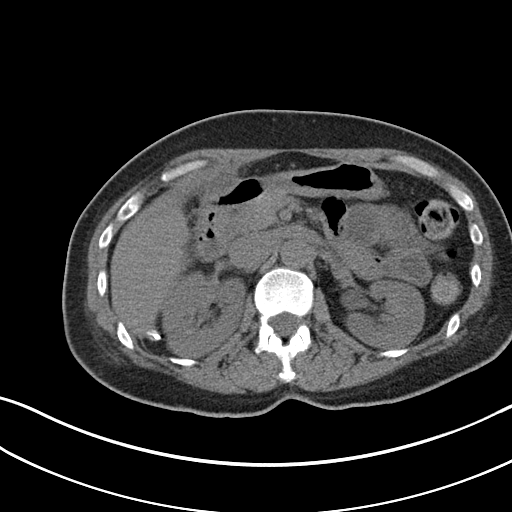
[im 37/73  soft-tissue]
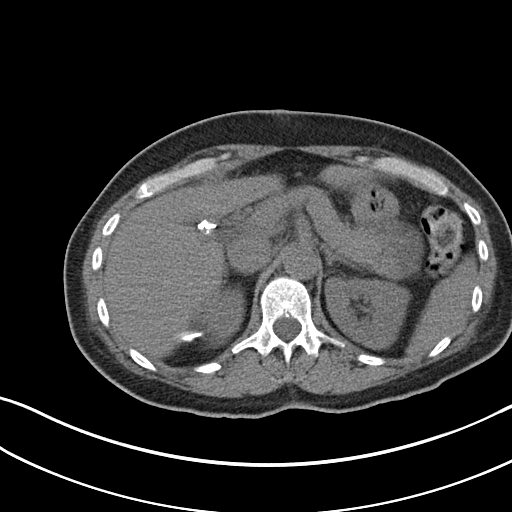
[im 43/73  soft-tissue]
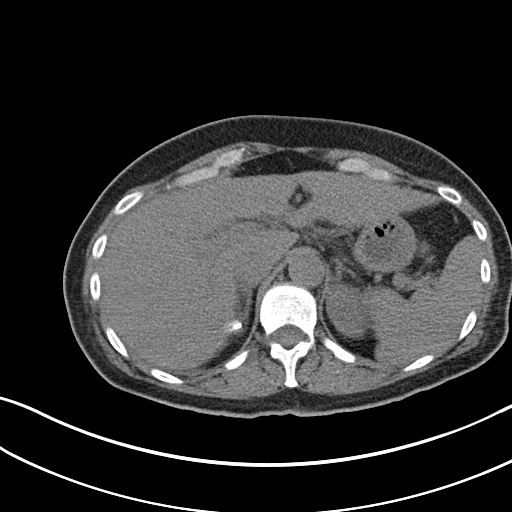
[im 49/73  soft-tissue]
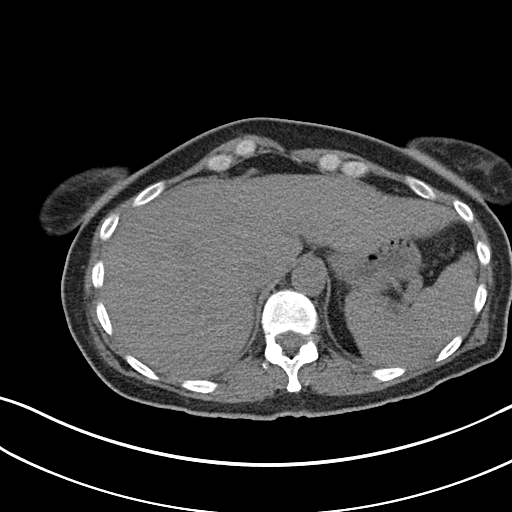
[im 49/73  bone]
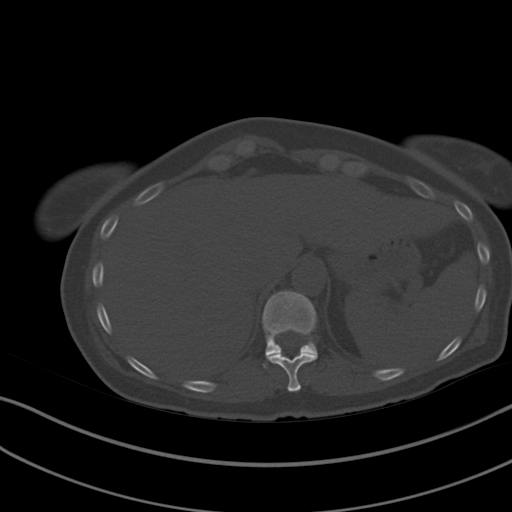
[im 52/73  soft-tissue]
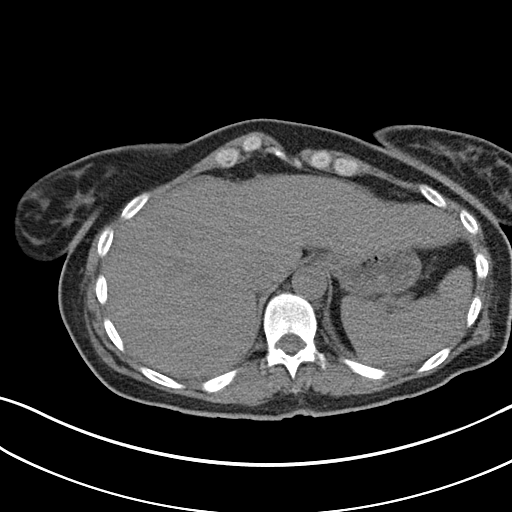
[im 58/73  soft-tissue]
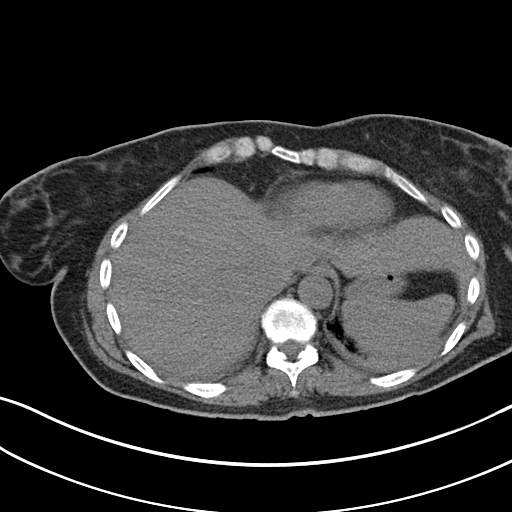
[im 61/73  lung]
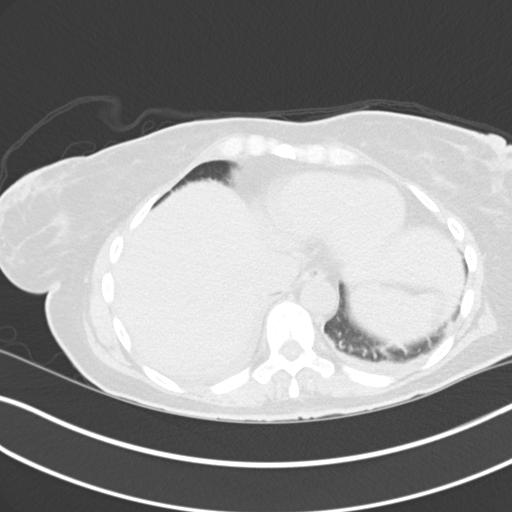
[im 64/73  soft-tissue]
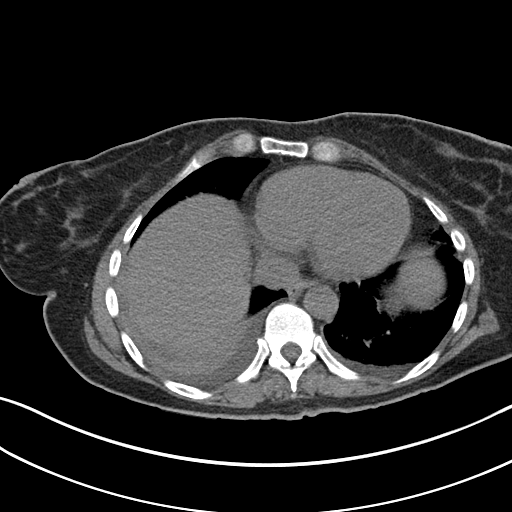
[im 64/73  lung]
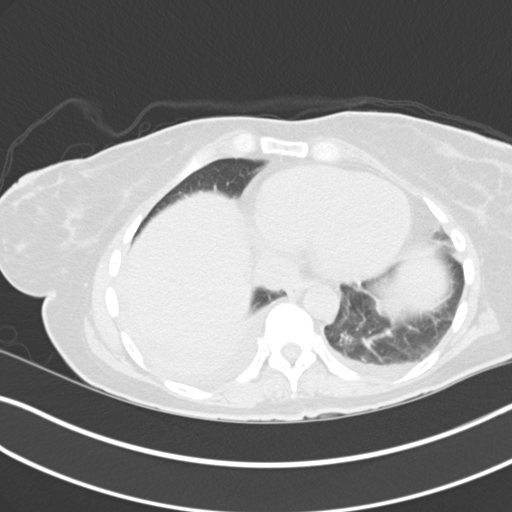
[im 67/73  lung]
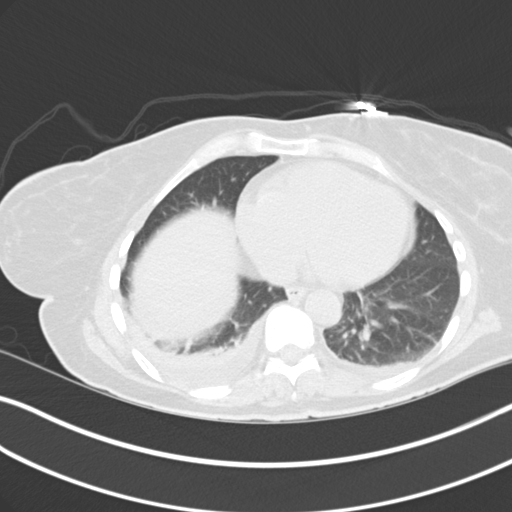
[im 70/73  soft-tissue]
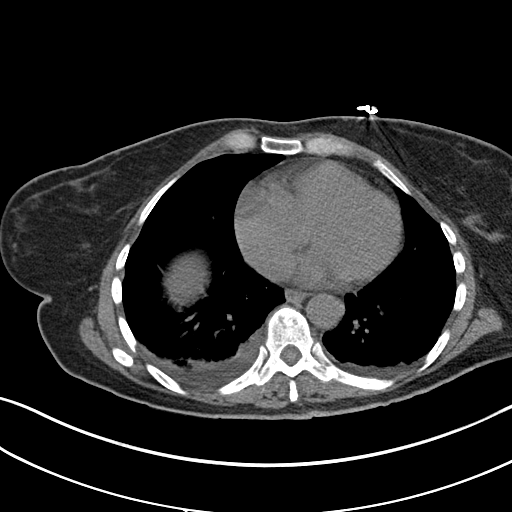
[im 70/73  lung]
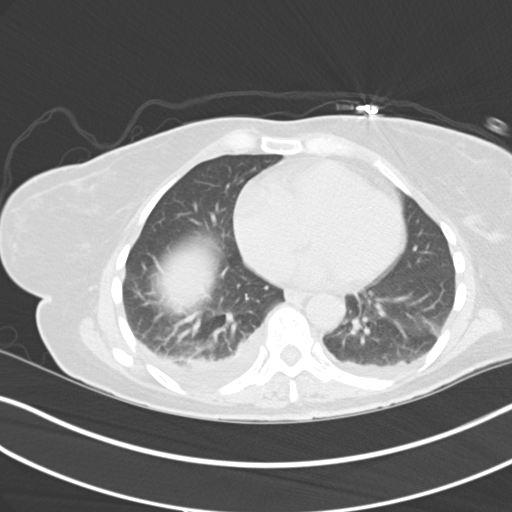

[15 of 32 positions shown; findings below may reference images not displayed]

PROCEDURE:
Patient was placed supine on the CT scanner. Images through the
upper abdomen obtained. The anterior right upper abdomen was prepped
and draped in sterile fashion. Skin was anesthetized with 1%
lidocaine. Using CT guidance, an 18 gauge needle was directed into
the anterior perihepatic fluid collection. Approximately 4 mL of
yellowish green clear fluid was aspirated. Needle was removed. Fluid
was sent for culture. Bandage placed over the puncture site.
FINDINGS: Small anterior perihepatic fluid collection. Needle was placed
within this collection and yellowish green fluid was removed.
IMPRESSION: Successful CT-guided aspiration of the small anterior perihepatic
fluid. Fluid could represent a small amount of bile or serous fluid.
This fluid was sent for culture.

## 2017-06-05 ENCOUNTER — Ambulatory Visit (INDEPENDENT_AMBULATORY_CARE_PROVIDER_SITE_OTHER): Payer: 59 | Admitting: Gynecology

## 2017-06-05 ENCOUNTER — Encounter: Payer: Self-pay | Admitting: Gynecology

## 2017-06-05 VITALS — BP 118/78 | Ht 64.0 in

## 2017-06-05 DIAGNOSIS — N951 Menopausal and female climacteric states: Secondary | ICD-10-CM | POA: Diagnosis not present

## 2017-06-05 DIAGNOSIS — Z01419 Encounter for gynecological examination (general) (routine) without abnormal findings: Secondary | ICD-10-CM

## 2017-06-05 DIAGNOSIS — Z30431 Encounter for routine checking of intrauterine contraceptive device: Secondary | ICD-10-CM | POA: Diagnosis not present

## 2017-06-05 NOTE — Patient Instructions (Signed)
Follow up with the IUD decision

## 2017-06-05 NOTE — Progress Notes (Signed)
    Laurie Terry 09/01/1966 [id number]        51 y.o.  [id number] for annual gynecologic exam.  Also wants to discuss menopause.  Past medical history,surgical history, problem list, medications, allergies, family history and social history were all reviewed and documented as reviewed in the EPIC chart.  ROS:  Performed with pertinent positives and negatives included in the history, assessment and plan.   Additional significant findings :  None   Exam: Wandra Scot assistant Vitals:   06/05/17 1121  BP: 118/78  Height: 5\' 4"  (1.626 m)   There is no height or weight on file to calculate BMI.  General appearance:  Normal affect, orientation and appearance. Skin: Grossly normal HEENT: Without gross lesions.  No cervical or supraclavicular adenopathy. Thyroid normal.  Lungs:  Clear without wheezing, rales or rhonchi Cardiac: RR, without RMG Abdominal:  Soft, nontender, without masses, guarding, rebound, organomegaly or hernia Breasts:  Examined lying and sitting without masses, retractions, discharge or axillary adenopathy. Pelvic:  Ext, BUS, Vagina: Normal  Cervix: Normal. IUD string visualized  Uterus: Anteverted, normal size, shape and contour, midline and mobile nontender   Adnexa: Without masses or tenderness    Anus and perineum: Normal   Rectovaginal: Normal sphincter tone without palpated masses or tenderness.    Assessment/Plan:  51 y.o. [name] female for annual gynecologic exam without menses, Mirena IUD.   1. Mirena IUD 05/2012. Due to be replaced/removed. Is having some hot flushes and sweats but not dramatically. I reviewed the issue of perimenopause and whether to remove her IUD or replace it for ongoing contraception and menstrual suppression particularly with the possibility of erratic periods during the perimenopause. Options to remove the IUD now and monitor her cycles versus checking an [facility] discussed. If [place] normal then replace her IUD. If elevated then decision  whether to replace it or remove it and monitor discussed. If removed and monitor the need for ongoing contraception until her menstrual history becomes clear. 1 year without menses as the official diagnosis for menopause discussed. Patient wants to go ahead and check [facility] and then follow up with her decision. Will also check her TSH to rule out thyroid dysfunction as a source of her menopausal type symptoms. 2. Pap smear/HPV 2015. No Pap smear done today. No history of abnormal Pap smears. Plan repeat Pap smear at 5 year interval per current screening guidelines. 3. Mammography 01/2017. Continue with annual mammography when due. SBE monthly reviewed. Breast exam normal today. 4. Colonoscopy 2016. Repeat at their recommended interval. 5. Health maintenance. No routine lab work drawn as she does this at her primary physician's office. Follow up 1 year, sooner as needed.   [name] MD, 12:00 PM 06/05/2017

## 2017-06-06 LAB — TSH: TSH: 0.56 mIU/L

## 2017-06-06 LAB — FOLLICLE STIMULATING HORMONE: FSH: 123.8 m[IU]/mL — ABNORMAL HIGH

## 2017-06-08 DIAGNOSIS — Z23 Encounter for immunization: Secondary | ICD-10-CM | POA: Diagnosis not present

## 2017-06-12 ENCOUNTER — Telehealth: Payer: Self-pay | Admitting: *Deleted

## 2017-06-12 NOTE — Telephone Encounter (Signed)
Okay to schedule replacement appointment

## 2017-06-12 NOTE — Telephone Encounter (Signed)
Pt was informed with [facility] results states about 2 months ago she had 1 day of spotting. should would rather replace IUD as she doesn't want to use backup contraception.

## 2017-06-13 NOTE — Telephone Encounter (Signed)
Will forward to wendy to check benefits.

## 2017-06-18 ENCOUNTER — Encounter: Payer: Self-pay | Admitting: Podiatry

## 2017-06-18 ENCOUNTER — Ambulatory Visit (INDEPENDENT_AMBULATORY_CARE_PROVIDER_SITE_OTHER): Payer: 59 | Admitting: Podiatry

## 2017-06-18 ENCOUNTER — Other Ambulatory Visit: Payer: Self-pay | Admitting: Podiatry

## 2017-06-18 ENCOUNTER — Ambulatory Visit (INDEPENDENT_AMBULATORY_CARE_PROVIDER_SITE_OTHER): Payer: 59

## 2017-06-18 DIAGNOSIS — M2011 Hallux valgus (acquired), right foot: Secondary | ICD-10-CM

## 2017-06-18 DIAGNOSIS — M779 Enthesopathy, unspecified: Secondary | ICD-10-CM

## 2017-06-18 NOTE — Progress Notes (Signed)
Subjective:    Patient ID: Laurie Terry, female   DOB: 51 y.o.   MRN: [id number]   HPI patient is concerned because she is developed a clicking in the interphalangeal joint of her right big toe and she's concerned about what this might be caused by    ROS      Objective:  Physical Exam neurovascular status intact with good alignment of the first MPJ with excellent range of motion of the first MPJ with no crepitus and slight irritation of the interphalangeal joint of the right big toe     Assessment:    Irritation secondary to inflammation but no indications of arthritis with excellent motion first MPJ     Plan:     X-ray reviewed and at this point patient may resume normal activity and this should be self-limiting and a 4 start to become painful patient will be seen back  X-ray indicates the osteotomy is healing well with joint congruence good alignment with no pathology

## 2017-08-07 ENCOUNTER — Ambulatory Visit (INDEPENDENT_AMBULATORY_CARE_PROVIDER_SITE_OTHER): Payer: 59 | Admitting: Gynecology

## 2017-08-07 ENCOUNTER — Encounter: Payer: Self-pay | Admitting: Gynecology

## 2017-08-07 VITALS — BP 116/74

## 2017-08-07 DIAGNOSIS — Z30433 Encounter for removal and reinsertion of intrauterine contraceptive device: Secondary | ICD-10-CM | POA: Diagnosis not present

## 2017-08-07 HISTORY — PX: INTRAUTERINE DEVICE INSERTION: SHX323

## 2017-08-07 NOTE — Progress Notes (Signed)
    Laurie Terry 1966-06-03 [id number]        51 y.o.  [id number]  presents for Mirena IUD removal and replacement. She has read through the booklet, has no contraindications and signed the consent form.  I reviewed the removal and insertional process with her as well as the risks to include infection, either immediate or long-term, uterine perforation or migration requiring surgery to remove, other complications such as pain and possibility of failure with subsequent pregnancy.   Exam with [name] assistant Vitals:   08/07/17 1551  BP: 116/74    Pelvic: External BUS vagina normal. Cervix normal with IUD string visualized. Uterus anteverted normal size shape contour midline mobile nontender. Adnexa without masses or tenderness.  Procedure: The cervix was lysed with a speculum and the old Mirena IUD string was grasped with a [place] forcep and the IUD was removed, shown to the patient and discarded.  The cervix was then cleansed with Betadine, anterior lip grasped with a single-tooth tenaculum, the uterus was sounded and a Mirena IUD was placed according to manufacturer's recommendations without difficulty. The strings were trimmed. The patient tolerated well and will follow up in one month for a postinsertional check.  Lot number:  [id number]    [name] MD, 4:12 PM 08/07/2017

## 2017-08-07 NOTE — Patient Instructions (Signed)
Intrauterine Device Insertion An intrauterine device (IUD) is a medical device that gets inserted into the uterus to prevent pregnancy. It is a small, T-shaped device that has one or two nylon strings hanging down from it. The strings hang out of the lower part of the uterus (cervix) to allow for future IUD removal. There are two types of IUDs available:  Copper IUD. This type of IUD has copper wire wrapped around it. Copper makes the uterus and fallopian tubes produce a fluid that kills sperm. A copper IUD may last up to 10 years.  Hormone IUD. This type of IUD is made of plastic and contains the hormone progestin (synthetic progesterone). The hormone thickens mucus in the cervix and prevents sperm from entering the uterus. It also thins the uterine lining to prevent implantation of a fertilized egg. The hormone can weaken or kill the sperm that get into the uterus. A hormone IUD may last 3-5 years.  Tell a health care provider about:  Any allergies you have.  All medicines you are taking, including vitamins, herbs, eye drops, creams, and over-the-counter medicines.  Any problems you or family members have had with anesthetic medicines.  Any blood disorders you have.  Any surgeries you have had.  Any medical conditions you have, including any STIs (sexually transmitted infections) you may have.  Whether you are pregnant or may be pregnant. What are the risks? Generally, this is a safe procedure. However, problems may occur, including:  Infection.  Bleeding.  Allergic reactions to medicines.  Accidental puncture (perforation) of the uterus, or damage to other structures or organs.  Accidental placement of the IUD either in the muscle layer of the uterus (myometrium) or outside the uterus.  The IUD falling out of the uterus (expulsion). This is more common among women who have recently had a child.  Pregnancy that happens in the fallopian tube (ectopic pregnancy).  Infection of  the uterus and fallopian tubes (pelvic inflammatory disease).  What happens before the procedure?  Schedule the IUD insertion for when you will have your menstrual period or right after, to make sure you are not pregnant. Placement of the IUD is better tolerated shortly after a menstrual cycle.  Follow instructions from your health care provider about eating or drinking restrictions.  Ask your health care provider about changing or stopping your regular medicines. This is especially important if you are taking diabetes medicines or blood thinners.  You may get a pain reliever to take before the procedure.  You may have tests for: ? Pregnancy. A pregnancy test involves having a urine sample taken. ? STIs. Placing an IUD in someone who has an STI can make the infection worse. ? Cervical cancer. You may have a Pap test to check for this type of cancer. This means collecting cells from your cervix to be examined under a microscope.  You may have a physical exam to determine the size and position of your uterus. The procedure may vary among health care providers and hospitals. What happens during the procedure?  A tool (speculum) will be placed in your vagina and widened so that your health care provider can see your cervix.  Medicine may be applied to your cervix to help lower your risk of infection (antiseptic medicine).  You may be given an anesthetic medicine to numb each side of your cervix (intracervical block or paracervical block). This medicine is usually given by an injection into the cervix.  A tool (uterine sound) will be inserted   into your uterus to determine the length of your uterus and the direction that your uterus may be tilted.  A slim instrument or tube (IUD inserter) that holds the IUD will be inserted into your vagina, through your cervical canal, and into your uterus.  The IUD will be placed in the uterus, and the IUD inserter will be removed.  The strings that are  attached to the IUD will be trimmed so that they lie just below the cervix. The procedure may vary among health care providers and hospitals. What happens after the procedure?  You may have bleeding after the procedure. This is normal. It varies from light bleeding (spotting) for a few days to menstrual-like bleeding.  You may have cramping and pain.  You may feel dizzy or light-headed.  You may have lower back pain. Summary  An intrauterine device (IUD) is a small, T-shaped device that has one or two nylon strings hanging down from it.  Two types of IUDs are available. You may have a copper IUD or a hormone IUD.  Schedule the IUD insertion for when you will have your menstrual period or right after, to make sure you are not pregnant. Placement of the IUD is better tolerated shortly after a menstrual cycle.  You may have bleeding after the procedure. This is normal. It varies from light spotting for a few days to menstrual-like bleeding. This information is not intended to replace advice given to you by your health care provider. Make sure you discuss any questions you have with your health care provider. Document Released: 05/08/2011 Document Revised: 07/31/2016 Document Reviewed: 07/31/2016 Elsevier Interactive Patient Education  2017 Elsevier Inc.  

## 2017-08-08 ENCOUNTER — Encounter: Payer: Self-pay | Admitting: Gynecology

## 2017-09-08 ENCOUNTER — Encounter: Payer: Self-pay | Admitting: Gynecology

## 2017-09-08 ENCOUNTER — Ambulatory Visit: Payer: 59 | Admitting: Gynecology

## 2017-09-08 VITALS — BP 116/74

## 2017-09-08 DIAGNOSIS — Z30431 Encounter for routine checking of intrauterine contraceptive device: Secondary | ICD-10-CM

## 2017-09-08 NOTE — Progress Notes (Signed)
    Laurie Terry 1965/10/31 [id number]        51 y.o.  [id number] presents for IUD follow-up exam.  Had her Mirena IUD switched 08/07/2017.  Had some cramping and bleeding afterwards.  Noticed spotting almost on a daily basis but this seems to have resolved.  No longer having cramping.  Past medical history,surgical history, problem list, medications, allergies, family history and social history were all reviewed and documented in the EPIC chart.  Directed ROS with pertinent positives and negatives documented in the history of present illness/assessment and plan.  Exam: [name] assistant Vitals:   09/08/17 1130  BP: 116/74   General appearance:  Normal Abdomen soft nontender without masses guarding rebound Pelvic external BUS vagina normal.  Cervix normal.  IUD string visualized and trimmed.  Uterus normal size midline mobile nontender.  Adnexa without masses or tenderness  Assessment/Plan:  51 y.o. [id number] normal IUD follow-up exam.  Was having cramping and some spotting but this is resolved.  Assuming she continues well she will follow-up for annual exam in September 2019, sooner as needed.    [name] MD, 11:40 AM 09/08/2017

## 2017-09-08 NOTE — Patient Instructions (Signed)
Follow-up in September 2019 for annual exam when due.  Sooner if any issues

## 2018-01-08 ENCOUNTER — Other Ambulatory Visit: Payer: Self-pay | Admitting: Gynecology

## 2018-01-08 DIAGNOSIS — Z1231 Encounter for screening mammogram for malignant neoplasm of breast: Secondary | ICD-10-CM

## 2018-02-23 ENCOUNTER — Ambulatory Visit: Payer: 59

## 2018-03-12 ENCOUNTER — Ambulatory Visit
Admission: RE | Admit: 2018-03-12 | Discharge: 2018-03-12 | Disposition: A | Discharge status: Home / Self Care | Payer: 59 | Source: Ambulatory Visit | Attending: Gynecology | Admitting: Gynecology

## 2018-03-12 DIAGNOSIS — Z1231 Encounter for screening mammogram for malignant neoplasm of breast: Secondary | ICD-10-CM

## 2018-03-13 ENCOUNTER — Other Ambulatory Visit: Payer: Self-pay | Admitting: Gynecology

## 2018-03-13 DIAGNOSIS — R928 Other abnormal and inconclusive findings on diagnostic imaging of breast: Secondary | ICD-10-CM

## 2018-03-17 ENCOUNTER — Ambulatory Visit
Admission: RE | Admit: 2018-03-17 | Discharge: 2018-03-17 | Disposition: A | Discharge status: Home / Self Care | Payer: 59 | Source: Ambulatory Visit | Attending: Gynecology | Admitting: Gynecology

## 2018-03-17 ENCOUNTER — Other Ambulatory Visit: Payer: Self-pay | Admitting: Gynecology

## 2018-03-17 ENCOUNTER — Other Ambulatory Visit: Payer: 59

## 2018-03-17 DIAGNOSIS — N6310 Unspecified lump in the right breast, unspecified quadrant: Secondary | ICD-10-CM

## 2018-03-17 DIAGNOSIS — R928 Other abnormal and inconclusive findings on diagnostic imaging of breast: Secondary | ICD-10-CM

## 2018-03-17 DIAGNOSIS — Z853 Personal history of malignant neoplasm of breast: Secondary | ICD-10-CM | POA: Diagnosis not present

## 2018-03-24 DIAGNOSIS — M25561 Pain in right knee: Secondary | ICD-10-CM | POA: Diagnosis not present

## 2018-03-24 DIAGNOSIS — M25562 Pain in left knee: Secondary | ICD-10-CM | POA: Diagnosis not present

## 2018-03-25 ENCOUNTER — Ambulatory Visit
Admission: RE | Admit: 2018-03-25 | Discharge: 2018-03-25 | Disposition: A | Discharge status: Home / Self Care | Payer: 59 | Source: Ambulatory Visit | Attending: Gynecology | Admitting: Gynecology

## 2018-03-25 ENCOUNTER — Other Ambulatory Visit: Payer: Self-pay | Admitting: Gynecology

## 2018-03-25 DIAGNOSIS — N6011 Diffuse cystic mastopathy of right breast: Secondary | ICD-10-CM | POA: Diagnosis not present

## 2018-03-25 DIAGNOSIS — N6310 Unspecified lump in the right breast, unspecified quadrant: Secondary | ICD-10-CM

## 2018-03-25 DIAGNOSIS — N6313 Unspecified lump in the right breast, lower outer quadrant: Secondary | ICD-10-CM | POA: Diagnosis not present

## 2018-04-03 DIAGNOSIS — I5022 Chronic systolic (congestive) heart failure: Secondary | ICD-10-CM | POA: Diagnosis not present

## 2018-04-03 DIAGNOSIS — R9439 Abnormal result of other cardiovascular function study: Secondary | ICD-10-CM | POA: Diagnosis not present

## 2018-04-03 DIAGNOSIS — I130 Hypertensive heart and chronic kidney disease with heart failure and stage 1 through stage 4 chronic kidney disease, or unspecified chronic kidney disease: Secondary | ICD-10-CM | POA: Diagnosis not present

## 2018-04-21 DIAGNOSIS — Z0000 Encounter for general adult medical examination without abnormal findings: Secondary | ICD-10-CM | POA: Diagnosis not present

## 2018-04-21 DIAGNOSIS — Z131 Encounter for screening for diabetes mellitus: Secondary | ICD-10-CM | POA: Diagnosis not present

## 2018-04-21 DIAGNOSIS — Z1321 Encounter for screening for nutritional disorder: Secondary | ICD-10-CM | POA: Diagnosis not present

## 2018-04-30 DIAGNOSIS — K1121 Acute sialoadenitis: Secondary | ICD-10-CM | POA: Diagnosis not present

## 2018-04-30 DIAGNOSIS — K1120 Sialoadenitis, unspecified: Secondary | ICD-10-CM | POA: Diagnosis not present

## 2018-05-15 DIAGNOSIS — E1122 Type 2 diabetes mellitus with diabetic chronic kidney disease: Secondary | ICD-10-CM | POA: Diagnosis not present

## 2018-05-15 DIAGNOSIS — N183 Chronic kidney disease, stage 3 (moderate): Secondary | ICD-10-CM | POA: Diagnosis not present

## 2018-05-15 DIAGNOSIS — Z794 Long term (current) use of insulin: Secondary | ICD-10-CM | POA: Diagnosis not present

## 2018-05-26 DIAGNOSIS — J358 Other chronic diseases of tonsils and adenoids: Secondary | ICD-10-CM | POA: Diagnosis not present

## 2018-05-26 DIAGNOSIS — J039 Acute tonsillitis, unspecified: Secondary | ICD-10-CM | POA: Diagnosis not present

## 2018-06-02 DIAGNOSIS — Z87891 Personal history of nicotine dependence: Secondary | ICD-10-CM | POA: Diagnosis not present

## 2018-06-02 DIAGNOSIS — J358 Other chronic diseases of tonsils and adenoids: Secondary | ICD-10-CM | POA: Diagnosis not present

## 2018-06-02 DIAGNOSIS — J343 Hypertrophy of nasal turbinates: Secondary | ICD-10-CM | POA: Diagnosis not present

## 2018-06-05 DIAGNOSIS — J329 Chronic sinusitis, unspecified: Secondary | ICD-10-CM | POA: Diagnosis not present

## 2018-06-05 DIAGNOSIS — J029 Acute pharyngitis, unspecified: Secondary | ICD-10-CM | POA: Diagnosis not present

## 2018-07-01 ENCOUNTER — Encounter: Payer: 59 | Admitting: Gynecology

## 2018-07-13 ENCOUNTER — Encounter: Payer: Self-pay | Admitting: Gynecology

## 2018-07-13 ENCOUNTER — Ambulatory Visit (INDEPENDENT_AMBULATORY_CARE_PROVIDER_SITE_OTHER): Payer: 59 | Admitting: Gynecology

## 2018-07-13 VITALS — BP 118/70 | Ht 65.5 in | Wt 136.0 lb

## 2018-07-13 DIAGNOSIS — Z30431 Encounter for routine checking of intrauterine contraceptive device: Secondary | ICD-10-CM | POA: Diagnosis not present

## 2018-07-13 DIAGNOSIS — Z1151 Encounter for screening for human papillomavirus (HPV): Secondary | ICD-10-CM

## 2018-07-13 DIAGNOSIS — Z01419 Encounter for gynecological examination (general) (routine) without abnormal findings: Secondary | ICD-10-CM

## 2018-07-13 NOTE — Addendum Note (Signed)
Addended by: Nelva Nay on: 07/13/2018 09:29 AM   Modules accepted: Orders

## 2018-07-13 NOTE — Progress Notes (Signed)
    Laurie Terry 11/17/1965 [id number]        52 y.o.  [id number] for annual gynecologic exam.  Doing well without gynecologic complaints  Past medical history,surgical history, problem list, medications, allergies, family history and social history were all reviewed and documented as reviewed in the EPIC chart.  ROS:  Performed with pertinent positives and negatives included in the history, assessment and plan.   Additional significant findings : None   Exam: [name] assistant Vitals:   07/13/18 0800  BP: 118/70  Weight: 136 lb (61.7 kg)  Height: 5' 5.5" (1.664 m)   Body mass index is 22.29 kg/m.  General appearance:  Normal affect, orientation and appearance. Skin: Grossly normal HEENT: Without gross lesions.  No cervical or supraclavicular adenopathy. Thyroid normal.  Lungs:  Clear without wheezing, rales or rhonchi Cardiac: RR, without RMG Abdominal:  Soft, nontender, without masses, guarding, rebound, organomegaly or hernia Breasts:  Examined lying and sitting without masses, retractions, discharge or axillary adenopathy. Pelvic:  Ext, BUS, Vagina: Normal  Cervix: Normal.  Pap smear/HPV.  IUD string visualized using colposcope with endocervical speculum within the endocervical canal.  Uterus: Anteverted, normal size, shape and contour, midline and mobile nontender   Adnexa: Without masses or tenderness    Anus and perineum: Normal   Rectovaginal: Normal sphincter tone without palpated masses or tenderness.    Assessment/Plan:  52 y.o. [id number] female for annual gynecologic exam without menses, Mirena IUD.   1. Mirena IUD 07/2017.  Doing well without bleeding.  IUD string visualized within the endocervical canal. 2. Postmenopausal.  Without significant apostle symptoms. 3. Pap smear/HPV 04/2014.  Pap smear/HPV today.  No history of abnormal Pap smears previously. 4. Mammography 02/2018.  Continue with annual mammography when due.  Breast exam normal  today. 5. Colonoscopy 2016.  Repeat at their recommended interval. 6. DEXA 2010.  Plan repeat DEXA at age 31. 17. Health maintenance.  No routine lab work done as patient reports this done elsewhere.  Follow-up 1 year, sooner as needed.   [name] MD, 8:20 AM 07/13/2018

## 2018-07-13 NOTE — Patient Instructions (Signed)
Follow-up in 1 year for annual exam, sooner if any issues. 

## 2018-07-15 LAB — PAP IG AND HPV HIGH-RISK: HPV DNA High Risk: NOT DETECTED

## 2018-08-26 DIAGNOSIS — Z85820 Personal history of malignant melanoma of skin: Secondary | ICD-10-CM | POA: Diagnosis not present

## 2018-08-26 DIAGNOSIS — Z86018 Personal history of other benign neoplasm: Secondary | ICD-10-CM | POA: Diagnosis not present

## 2018-08-26 DIAGNOSIS — L814 Other melanin hyperpigmentation: Secondary | ICD-10-CM | POA: Diagnosis not present

## 2018-08-31 DIAGNOSIS — I255 Ischemic cardiomyopathy: Secondary | ICD-10-CM | POA: Diagnosis not present

## 2018-09-29 DIAGNOSIS — Z87891 Personal history of nicotine dependence: Secondary | ICD-10-CM | POA: Diagnosis not present

## 2018-09-29 DIAGNOSIS — K219 Gastro-esophageal reflux disease without esophagitis: Secondary | ICD-10-CM | POA: Diagnosis not present

## 2018-09-29 DIAGNOSIS — J358 Other chronic diseases of tonsils and adenoids: Secondary | ICD-10-CM | POA: Diagnosis not present

## 2018-09-30 DIAGNOSIS — E1122 Type 2 diabetes mellitus with diabetic chronic kidney disease: Secondary | ICD-10-CM | POA: Diagnosis not present

## 2018-09-30 DIAGNOSIS — N183 Chronic kidney disease, stage 3 (moderate): Secondary | ICD-10-CM | POA: Diagnosis not present

## 2018-09-30 DIAGNOSIS — Z794 Long term (current) use of insulin: Secondary | ICD-10-CM | POA: Diagnosis not present

## 2018-10-02 DIAGNOSIS — Z0000 Encounter for general adult medical examination without abnormal findings: Secondary | ICD-10-CM | POA: Diagnosis not present

## 2018-10-02 DIAGNOSIS — E785 Hyperlipidemia, unspecified: Secondary | ICD-10-CM | POA: Diagnosis not present

## 2018-10-02 DIAGNOSIS — E039 Hypothyroidism, unspecified: Secondary | ICD-10-CM | POA: Diagnosis not present

## 2018-10-09 DIAGNOSIS — E039 Hypothyroidism, unspecified: Secondary | ICD-10-CM | POA: Diagnosis not present

## 2018-10-09 DIAGNOSIS — E785 Hyperlipidemia, unspecified: Secondary | ICD-10-CM | POA: Diagnosis not present

## 2018-10-09 DIAGNOSIS — Z0000 Encounter for general adult medical examination without abnormal findings: Secondary | ICD-10-CM | POA: Diagnosis not present

## 2018-10-09 DIAGNOSIS — Z23 Encounter for immunization: Secondary | ICD-10-CM | POA: Diagnosis not present

## 2018-10-29 ENCOUNTER — Other Ambulatory Visit: Payer: Self-pay | Admitting: Gastroenterology

## 2018-10-29 ENCOUNTER — Other Ambulatory Visit (HOSPITAL_COMMUNITY): Payer: Self-pay | Admitting: Gastroenterology

## 2018-10-29 DIAGNOSIS — R1011 Right upper quadrant pain: Secondary | ICD-10-CM | POA: Diagnosis not present

## 2018-11-16 ENCOUNTER — Ambulatory Visit
Admission: RE | Admit: 2018-11-16 | Discharge: 2018-11-16 | Disposition: A | Discharge status: Home / Self Care | Payer: 59 | Source: Ambulatory Visit | Attending: Gastroenterology | Admitting: Gastroenterology

## 2018-11-16 ENCOUNTER — Other Ambulatory Visit: Payer: Self-pay | Admitting: Gynecology

## 2018-11-16 ENCOUNTER — Telehealth: Payer: Self-pay | Admitting: *Deleted

## 2018-11-16 DIAGNOSIS — Z803 Family history of malignant neoplasm of breast: Secondary | ICD-10-CM

## 2018-11-16 DIAGNOSIS — R1011 Right upper quadrant pain: Secondary | ICD-10-CM

## 2018-11-16 NOTE — Telephone Encounter (Signed)
Patient called and reporting her sister recent diagnosis with breast cancer under age 54. She wanted to know if anything else should be done besides having yearly mammogram screenings? Please advise

## 2018-11-16 NOTE — Telephone Encounter (Signed)
Patient said sister did have genetic counseling/testing states the genetics told patient she should have screening mammograms every 6 months. I called breast center to discuss and was told to put all this information in order, patient last mammogram was in June 2019. Order placed patient aware.

## 2018-11-16 NOTE — Telephone Encounter (Signed)
Her sister should probably have genetic counseling and testing.  I usually recommend that the sister should ask her oncologist is there anything special my family members need to do and they will recommend as far as the need for increased surveillance.

## 2018-11-26 ENCOUNTER — Ambulatory Visit: Payer: 59 | Admitting: Gynecology

## 2018-11-26 ENCOUNTER — Encounter: Payer: Self-pay | Admitting: Gynecology

## 2018-11-26 VITALS — BP 118/74

## 2018-11-26 DIAGNOSIS — Z30431 Encounter for routine checking of intrauterine contraceptive device: Secondary | ICD-10-CM

## 2018-11-26 NOTE — Progress Notes (Signed)
    Laurie Terry Apr 22, 1966 [id number]        53 y.o.  [id number] presents having recently had a CT scan done due to fleeting lower abdominal pain where they noted malposition of the IUD eccentrically positioned in the right uterine fundus likely embedded within the myometrium.  Patient's pain is fleeting pulling particular with movement in the periumbilical region.  It was felt it was secondary to adhesions.   Past medical history,surgical history, problem list, medications, allergies, family history and social history were all reviewed and documented in the EPIC chart.  Directed ROS with pertinent positives and negatives documented in the history of present illness/assessment and plan.  Exam: [name] assistant Vitals:   11/26/18 1508  BP: 118/74   General appearance:  Normal Abdomen soft nontender without masses guarding rebound Pelvic external BUS vagina normal.  Cervix normal with IUD string visualized.  Uterus anteverted normal size midline mobile nontender.  Adnexa without masses or tenderness.  Assessment/Plan:  53 y.o. [id number] with recent CT scan for lower abdominal fleeting discomfort noting IUD appeared to be malpositioned.  Pain is more periumbilical and I doubt related to the IUD.  We discussed malposition of IUDs and the issues as to whether it affects the contraceptive efficacy.  Also in her particular case whether would be related to this pain although again it does not sound like it is.  Options for management include pulling the IUD now and see how she does from his pain standpoint using alternative contraception noting she is age 54.  Follow-up ultrasound for IUD position and if malpositioned then making a decision whether to remove this or not.  At this point the patient wants to follow-up for ultrasound and then further discuss her options.  She will schedule the ultrasound and follow-up for this.    [name] MD, 3:51 PM 11/26/2018

## 2018-11-26 NOTE — Patient Instructions (Signed)
Follow up for ultrasound as scheduled 

## 2018-12-10 ENCOUNTER — Other Ambulatory Visit: Payer: Self-pay

## 2018-12-10 ENCOUNTER — Ambulatory Visit (INDEPENDENT_AMBULATORY_CARE_PROVIDER_SITE_OTHER): Payer: 59

## 2018-12-10 ENCOUNTER — Encounter: Payer: Self-pay | Admitting: Gynecology

## 2018-12-10 ENCOUNTER — Ambulatory Visit: Payer: 59 | Admitting: Gynecology

## 2018-12-10 VITALS — BP 116/76

## 2018-12-10 DIAGNOSIS — Z30431 Encounter for routine checking of intrauterine contraceptive device: Secondary | ICD-10-CM

## 2018-12-10 NOTE — Patient Instructions (Signed)
Schedule appointment to have your IUD removed at your convenience.

## 2018-12-10 NOTE — Progress Notes (Signed)
    Laurie Terry 07/31/1966 [id number]        52 y.o.  [id number] presents for follow-up ultrasound.  She had CT scan done due to fleeting abdominal pain more in the periumbilical region where they noted the IUD appeared to be malpositioned.  She is having no bleeding.  Of note she had an [facility] of 123 05/2017.  Past medical history,surgical history, problem list, medications, allergies, family history and social history were all reviewed and documented in the EPIC chart.  Directed ROS with pertinent positives and negatives documented in the history of present illness/assessment and plan.  Exam: Vitals:   12/10/18 1140  BP: 116/76   General appearance:  Normal  Ultrasound shows uterus normal size and echotexture.  Endometrial echo thin.  IUD noted to be in proper location in the body and right arm.  The left arm appears to protrude into the myometrium.  Small intramural myoma noted.  Right and left ovaries visualized and atrophic in appearance.  Cul-de-sac negative.  Assessment/Plan:  53 y.o. [id number] apparent malposition of her IUD.  Fleeting periumbilical type discomfort.  I doubt this is related to her IUD.  Options for management reviewed and ultimately we both agree on removing the IUD.  She clearly is in the menopausal range with her [place] 123.  She does not want to have the IUD removed today but will return for this.  Possible difficulties doing this in the office was also discussed.    [name] MD, 12:10 PM 12/10/2018

## 2019-04-01 ENCOUNTER — Ambulatory Visit
Admission: RE | Admit: 2019-04-01 | Discharge: 2019-04-01 | Disposition: A | Discharge status: Home / Self Care | Payer: 59 | Source: Ambulatory Visit | Attending: Gynecology | Admitting: Gynecology

## 2019-04-01 DIAGNOSIS — Z803 Family history of malignant neoplasm of breast: Secondary | ICD-10-CM

## 2019-05-27 ENCOUNTER — Ambulatory Visit (INDEPENDENT_AMBULATORY_CARE_PROVIDER_SITE_OTHER): Payer: 59 | Admitting: Gynecology

## 2019-05-27 ENCOUNTER — Encounter: Payer: Self-pay | Admitting: Gynecology

## 2019-05-27 ENCOUNTER — Other Ambulatory Visit: Payer: Self-pay

## 2019-05-27 VITALS — BP 118/76 | Ht 65.0 in | Wt 142.0 lb

## 2019-05-27 DIAGNOSIS — Z01419 Encounter for gynecological examination (general) (routine) without abnormal findings: Secondary | ICD-10-CM

## 2019-05-27 DIAGNOSIS — Z803 Family history of malignant neoplasm of breast: Secondary | ICD-10-CM | POA: Diagnosis not present

## 2019-05-27 DIAGNOSIS — Z30432 Encounter for removal of intrauterine contraceptive device: Secondary | ICD-10-CM

## 2019-05-27 NOTE — Progress Notes (Signed)
    Laurie Terry 04-Sep-1966 [id number]        53 y.o.  [id number] for annual gynecologic exam.  Also to have her IUD removed.  Is having fleeting abdominal pain where CT scan suggested IUD was malpositioned.  Follow-up ultrasound showed the left arm in the myometrium.  [place] 2 years ago was 123.  Not having significant hot flushes or sweats.  Past medical history,surgical history, problem list, medications, allergies, family history and social history were all reviewed and documented as reviewed in the EPIC chart.  ROS:  Performed with pertinent positives and negatives included in the history, assessment and plan.   Additional significant findings : None   Exam: Laurie Terry assistant Vitals:   05/27/19 0816  BP: 118/76  Weight: 142 lb (64.4 kg)  Height: 5\' 5"  (1.651 m)   Body mass index is 23.63 kg/m.  General appearance:  Normal affect, orientation and appearance. Skin: Grossly normal HEENT: Without gross lesions.  No cervical or supraclavicular adenopathy. Thyroid normal.  Lungs:  Clear without wheezing, rales or rhonchi Cardiac: RR, without RMG Abdominal:  Soft, nontender, without masses, guarding, rebound, organomegaly or hernia Breasts:  Examined lying and sitting without masses, retractions, discharge or axillary adenopathy. Pelvic:  Ext, BUS, Vagina: Normal  Cervix: Normal  Uterus: Anteverted, normal size, shape and contour, midline and mobile nontender   Adnexa: Without masses or tenderness    Anus and perineum: Normal   Rectovaginal: Normal sphincter tone without palpated masses or tenderness.   Procedure: The IUD string was grasped within the endocervical canal with a forcep and her IUD was removed without difficulty noted to be intact.  It was shown to the patient and discarded  Assessment/Plan:  53 y.o. [id number] female for annual gynecologic exam.   1. Mirena IUD removal.  Patient will monitor for any bleeding or other issues.  Did recommend backup contraception  for now.  We will follow-up if she develops any significant menopausal symptoms or has any bleeding. 2. Pap smear/HPV 2019.  No Pap smear done today.  No history of abnormal Pap smears.  Plan repeat Pap smear/HPV at 5-year interval per current screening guidelines. 3. Colonoscopy 2016.  Repeat at their recommended interval. 4. DEXA 2010.  Plan repeat DEXA at age 78. 26. Mammography 03/2019.  Sister with recurrent breast cancer premenopausally.  Her sister was genetically tested with expanded panel which was negative.  Radiologist did breast cancer risk calculation on the patient which was reported at 24% and the radiologist recommended to consider MRI screening.  The patient and I discussed this in the ACR recommendations for MRI screening greater than 20% lifetime risk.  The patient wants to go ahead with MRI screening and will plan at a six-month interval from her July mammography.  She will call me if there are any problems arranging this. 6. Health maintenance.  No routine lab work done as patient reports that recently done elsewhere.  Follow-up 1 year, sooner as needed   [name] MD, 8:48 AM 05/27/2019

## 2019-05-27 NOTE — Patient Instructions (Signed)
Follow-up for the MRI in January as arranged.  Follow-up in 1 year for annual exam

## 2019-06-16 ENCOUNTER — Encounter: Payer: Self-pay | Admitting: Gynecology

## 2019-07-15 ENCOUNTER — Encounter: Payer: Self-pay | Admitting: *Deleted

## 2019-07-15 ENCOUNTER — Telehealth: Payer: Self-pay | Admitting: *Deleted

## 2019-07-15 DIAGNOSIS — Z803 Family history of malignant neoplasm of breast: Secondary | ICD-10-CM

## 2019-07-15 NOTE — Telephone Encounter (Signed)
-----   Message from Laurie Auerbach, MD sent at 05/27/2019  8:55 AM EDT ----- Arrange for breast MRI in January 2021 reference family history of breast cancer with patient's calculated 10-year risk of breast cancer at 24%

## 2019-07-15 NOTE — Telephone Encounter (Signed)
Order placed at [facility] imaging, patient message sent for patient to call and schedule.

## 2019-07-16 ENCOUNTER — Encounter: Payer: 59 | Admitting: Gynecology

## 2019-08-03 NOTE — Telephone Encounter (Signed)
Apt 09/22/19 KW CMA

## 2019-08-04 ENCOUNTER — Other Ambulatory Visit: Payer: Self-pay

## 2019-08-04 DIAGNOSIS — Z20822 Contact with and (suspected) exposure to covid-19: Secondary | ICD-10-CM

## 2019-08-06 LAB — NOVEL CORONAVIRUS, NAA: SARS-CoV-2, NAA: NOT DETECTED

## 2019-09-01 ENCOUNTER — Telehealth: Payer: Self-pay

## 2019-09-01 NOTE — Telephone Encounter (Signed)
I called [employer] and spoke with Zenia Resides A. Call [phone] and checked on PA for MRI breast CPT [phone] and was told code is valid/billable and her plan does not require authorization.

## 2019-09-22 ENCOUNTER — Ambulatory Visit
Admission: RE | Admit: 2019-09-22 | Discharge: 2019-09-22 | Disposition: A | Discharge status: Home / Self Care | Payer: 59 | Source: Ambulatory Visit | Attending: Gynecology | Admitting: Gynecology

## 2019-09-22 ENCOUNTER — Encounter: Payer: Self-pay | Admitting: Gynecology

## 2019-09-22 DIAGNOSIS — Z803 Family history of malignant neoplasm of breast: Secondary | ICD-10-CM

## 2019-09-22 MED ORDER — GADOBUTROL 1 MMOL/ML IV SOLN
7.0000 mL | Freq: Once | INTRAVENOUS | Status: AC | PRN
  Administered 2019-09-22: 10:00:00 7 mL via INTRAVENOUS

## 2020-02-22 ENCOUNTER — Other Ambulatory Visit: Payer: Self-pay | Admitting: Obstetrics and Gynecology

## 2020-02-22 DIAGNOSIS — Z1231 Encounter for screening mammogram for malignant neoplasm of breast: Secondary | ICD-10-CM

## 2020-04-03 ENCOUNTER — Other Ambulatory Visit: Payer: Self-pay

## 2020-04-03 ENCOUNTER — Ambulatory Visit
Admission: RE | Admit: 2020-04-03 | Discharge: 2020-04-03 | Disposition: A | Discharge status: Home / Self Care | Payer: 59 | Source: Ambulatory Visit | Attending: Obstetrics and Gynecology | Admitting: Obstetrics and Gynecology

## 2020-04-03 DIAGNOSIS — Z1231 Encounter for screening mammogram for malignant neoplasm of breast: Secondary | ICD-10-CM

## 2020-06-06 ENCOUNTER — Other Ambulatory Visit: Payer: Self-pay

## 2020-06-06 ENCOUNTER — Ambulatory Visit (INDEPENDENT_AMBULATORY_CARE_PROVIDER_SITE_OTHER): Payer: 59 | Admitting: Obstetrics and Gynecology

## 2020-06-06 ENCOUNTER — Encounter: Payer: Self-pay | Admitting: Obstetrics and Gynecology

## 2020-06-06 VITALS — BP 118/76 | Ht 65.5 in | Wt 152.0 lb

## 2020-06-06 DIAGNOSIS — Z01419 Encounter for gynecological examination (general) (routine) without abnormal findings: Secondary | ICD-10-CM | POA: Diagnosis not present

## 2020-06-06 DIAGNOSIS — N951 Menopausal and female climacteric states: Secondary | ICD-10-CM

## 2020-06-06 NOTE — Progress Notes (Signed)
   Makynzie Dobesh Mcclaskey April 08, 1966 [id number]  SUBJECTIVE:  54 y.o. [id number] female for annual routine gynecologic exam. She has been amenorrheic since the IUD removal last year.  She is feeling some waves of warmth/hot flashes but no sweating.  Generalized brain fog symptoms and also vaginal dryness.  Unfortunately her hunger sister was diagnosed with recurrence of breast cancer recently.  No current outpatient medications on file.   No current facility-administered medications for this visit.   Allergies: Iodine, Other, Ampicillin, Contrast media [iodinated diagnostic agents], Levofloxacin, and Septra [bactrim]  No LMP recorded. Patient is postmenopausal.  Past medical history,surgical history, problem list, medications, allergies, family history and social history were all reviewed and documented as reviewed in the EPIC chart.  ROS: Pertinent positives and negatives as documented in HPI  OBJECTIVE:  BP 118/76   Ht 5' 5.5" (1.664 m)   Wt 152 lb (68.9 kg)   BMI 24.91 kg/m  The patient appears well, alert, oriented x 3, in no distress. ENT normal.  Neck supple. No cervical or supraclavicular adenopathy or thyromegaly.  Lungs are clear, good air entry, no wheezes, rhonchi or rales. S1 and S2 normal, no murmurs, regular rate and rhythm.  Abdomen soft without tenderness, guarding, mass or organomegaly.  Neurological is normal, no focal findings.  BREAST EXAM: breasts appear normal, no suspicious masses, no skin or nipple changes or axillary nodes  PELVIC EXAM: VULVA: normal appearing vulva with no masses, tenderness or lesions, VAGINA: normal appearing vagina with normal color and discharge, no lesions, CERVIX: normal appearing cervix without discharge or lesions, UTERUS: uterus is normal size, shape, consistency and nontender, ADNEXA: normal adnexa in size, nontender and no masses  Chaperone: [name] present during the examination  ASSESSMENT:  54 y.o. [id number] here for annual  gynecologic exam  PLAN:   1. Postmenopausal.  We discussed the typical menopausal symptoms that she is experiencing some basic remedies including OTC product such as soy black cohosh, can use Replens vaginal moisturizer and also vaginal lubricants as needed for the vaginal dryness portion.  We also discussed HRT and she is not interested at this time with the risks of thrombotic diseases and the breast cancer issue.  Also discussed localized vaginal estrogen therapy.  She will try OTC products first and let us know if she needs anything beyond that. 2. Pap smear/HPV 06/2018.  No significant history of abnormal Pap smears.  Next Pap smear due 2024 following the current guidelines recommending the 5 year interval. 3. Mammogram 03/2020.  Normal breast exam today.  Family history notable for a younger sister with recurrent breast cancer and the expanded genetic testing panel was apparently negative.  She will continue with annual mammograms, also her lifetime breast cancer risk calculation was reported at 24% per radiology and ACR recommendation is for MRI breast screening which she will continue along with her annual mammograms.  I will have staff place that order. 4. Colonocopy 2016.  She will follow up at the interval recommended by her GI specialist. 5. DEXA 2010.  Next DEXA recommended at age 57.  7. Health maintenance.  No labs today as she states she recently had these completed with her primary care provider.   Return annually or sooner, prn.  Joseph Pierini MD 06/06/20

## 2020-06-26 ENCOUNTER — Telehealth: Payer: Self-pay

## 2020-06-26 NOTE — Telephone Encounter (Signed)
I called patient and let her know that referral coordinator will place order and [facility] will be calling her to schedule appointment.

## 2020-06-26 NOTE — Telephone Encounter (Signed)
Ready to schedule Breast MRI.  Forwarded message to [facility].

## 2020-06-27 ENCOUNTER — Telehealth: Payer: Self-pay | Admitting: *Deleted

## 2020-06-27 DIAGNOSIS — Z803 Family history of malignant neoplasm of breast: Secondary | ICD-10-CM

## 2020-06-27 NOTE — Telephone Encounter (Signed)
Patient called back would like to schedule for Dec. Order placed my chart message sent to schedule.

## 2020-06-27 NOTE — Telephone Encounter (Signed)
Patient called yesterday stating she is ready to schedule. I called this am and left detailed message on cell that per Dr.Kendall patient was to have Mri in 6 months from last mammogram which was 7/21. I asked patient to call me to discuss.

## 2020-06-27 NOTE — Telephone Encounter (Signed)
-----   Message from Ramond Craver, [place] sent at 06/06/2020  9:06 AM EDT ----- Regarding: FW: Breast MRI  ----- Message ----- From: Joseph Pierini, MD Sent: 06/06/2020   8:35 AM EDT To: [facility] Clinical Pool Subject: Breast MRI                                     Patient has an elevated risk of breast cancer and I would like her to continue with annual breast MRI 6 months after her last mammogram.  Thank you

## 2020-06-28 NOTE — Telephone Encounter (Signed)
Patient scheduled on 08/31/20

## 2020-08-31 ENCOUNTER — Ambulatory Visit
Admission: RE | Admit: 2020-08-31 | Discharge: 2020-08-31 | Disposition: A | Discharge status: Home / Self Care | Payer: 59 | Source: Ambulatory Visit | Attending: Obstetrics and Gynecology | Admitting: Obstetrics and Gynecology

## 2020-08-31 ENCOUNTER — Other Ambulatory Visit: Payer: Self-pay

## 2020-08-31 DIAGNOSIS — Z803 Family history of malignant neoplasm of breast: Secondary | ICD-10-CM

## 2020-08-31 MED ORDER — GADOBUTROL 1 MMOL/ML IV SOLN
7.0000 mL | Freq: Once | INTRAVENOUS | Status: AC | PRN
  Administered 2020-08-31: 7 mL via INTRAVENOUS

## 2020-09-23 DIAGNOSIS — I719 Aortic aneurysm of unspecified site, without rupture: Secondary | ICD-10-CM

## 2020-09-23 HISTORY — DX: Aortic aneurysm of unspecified site, without rupture: I71.9

## 2020-10-30 ENCOUNTER — Ambulatory Visit: Payer: 59 | Admitting: Internal Medicine

## 2020-10-30 ENCOUNTER — Encounter: Payer: Self-pay | Admitting: Internal Medicine

## 2020-10-30 ENCOUNTER — Other Ambulatory Visit: Payer: Self-pay

## 2020-10-30 VITALS — BP 90/64 | HR 64 | Ht 65.5 in | Wt 150.0 lb

## 2020-10-30 DIAGNOSIS — I491 Atrial premature depolarization: Secondary | ICD-10-CM | POA: Insufficient documentation

## 2020-10-30 DIAGNOSIS — R079 Chest pain, unspecified: Secondary | ICD-10-CM | POA: Insufficient documentation

## 2020-10-30 DIAGNOSIS — E860 Dehydration: Secondary | ICD-10-CM

## 2020-10-30 DIAGNOSIS — I471 Supraventricular tachycardia: Secondary | ICD-10-CM | POA: Diagnosis not present

## 2020-10-30 DIAGNOSIS — R55 Syncope and collapse: Secondary | ICD-10-CM

## 2020-10-30 DIAGNOSIS — I712 Thoracic aortic aneurysm, without rupture, unspecified: Secondary | ICD-10-CM | POA: Insufficient documentation

## 2020-10-30 NOTE — Patient Instructions (Addendum)
Medication Instructions:  Your physician recommends that you continue on your current medications as directed. Please refer to the Current Medication list given to you today.  *If you need a refill on your cardiac medications before your next appointment, please call your pharmacy*   Lab Work: NONE If you have labs (blood work) drawn today and your tests are completely normal, you will receive your results only by: Marland Kitchen MyChart Message (if you have MyChart) OR . A paper copy in the mail If you have any lab test that is abnormal or we need to change your treatment, we will call you to review the results.   Testing/Procedures: Your physician has requested that you have a stress echocardiogram.        COVID TEST--___________-- Dennis Bast will go to [place]. [place], [place] [address]  for your Covid testing.   This is a drive thru test site.   Be sure to share with the first checkpoint that you are there for pre-procedure/surgery testing. Stay in your car and the nurse team will come to your car to test you.  After you are tested please go home and self-quarantine until the day of your procedure.      Your physician has requested that you have an echocardiogram. Echocardiography is a painless test that uses sound waves to create images of your heart. It provides your doctor with information about the size and shape of your heart and how well your heart's chambers and valves are working. This procedure takes approximately one hour. There are no restrictions for this procedure.       Follow-Up: At [facility], you and your health needs are our priority.  As part of our continuing mission to provide you with exceptional heart care, we have created designated Provider Care Teams.  These Care Teams include your primary Cardiologist (physician) and Advanced Practice Providers (APPs -  Physician Assistants and Nurse Practitioners) who all work together to provide you with the care you need, when  you need it.    Your next appointment:   3 month(s)  The format for your next appointment:   In Person  Provider:   You may see Gasper Sells, MD or one of the following Advanced Practice Providers on your designated Care Team:    Melina Copa, PA-C  Ermalinda Barrios, PA-C

## 2020-10-30 NOTE — Progress Notes (Signed)
Cardiology Office Note:    Date:  10/30/2020   ID:  Laurie Terry, DOB 1966/09/10, MRN [id number]  PCP:  [name], MD  [place] Cardiologist:  No primary care provider on file.  Saw. Dr. Marlou Porch 10/2013 [facility] Electrophysiologist:  None   CC: Chest pain Consulted for the evaluation of chest pain at the behest of [name], MD  History of Present Illness:    Laurie Terry is a 55 y.o. female with a hx of Paroxysmal SVT, PACs, Gestational DM with fourth child,  Mild thoracic aortic aneurysm- contrast allergy who presents for evaluation 10/30/20.  Oncological History notable for: Malignancies: Melanoma Surgery: Nail bed removal 2000  Patient notes that she is feeling ok but has been having some chest pain.  Chest pain occurs constantly, every day.  Chest pain goes to her jaw and down her arm on occasion.    Started early December, and went to the doctor because it did not improve.  Notes a sharp pain under her left breast..  Discomfort occurs with no triggers, worsens with no triggers, and improves with  Motrin in the begging. Patient exertion notable for walking and hiking with no symptoms.  No shortness of breath.  No PND or orthopnea.  No bendopnea, weight gain, leg swelling , or abdominal swelling.  Still has rare funny heart beats and palpitations.  Also feels near syncope:  Notes that everything gets dark and feels like she is going to pass out.  Notes the first episode [facility] driving up but before hiking.  Patient notes that she had near syncope while driving.  Has never had cardiac syncope.   Past Medical History:  Diagnosis Date  . Allergic rhinitis   . Cancer ([place])    Melanoma  . Cervical dysplasia 2007   low-grade SIL, normal Pap smears since then  . Family history of adverse reaction to anesthesia    mom has h/o post op nausea/vomiting  . Intermittent palpitations   . IUD    inserted 03/2002.04/06/2007, 06/03/2012  . Ocular migraine    . Osteomalacia   . Pancreatitis   . Seafood allergy     Past Surgical History:  Procedure Laterality Date  . BUNIONECTOMY  2018  . CHOLECYSTECTOMY N/A 04/09/2016   Procedure: LAPAROSCOPIC CHOLECYSTECTOMY ;  Surgeon: Rolm Bookbinder, MD;  Location: [facility];  Service: General;  Laterality: N/A;  . ERCP  04/11/2016   ERCP with pancreatic stent and failed CBD stent after needle knife and conventional sphincterotomy  . ERCP N/A 04/12/2016   Procedure: ENDOSCOPIC RETROGRADE CHOLANGIOPANCREATOGRAPHY (ERCP);  Surgeon: Clarene Essex, MD;  Location: [facility] ENDOSCOPY;  Service: Endoscopy;  Laterality: N/A;  . FOOT SURGERY     toenail  . INTRAUTERINE DEVICE INSERTION  08/07/2017   Mirena  . removal of melanoma      Current Medications: No outpatient medications have been marked as taking for the 10/30/20 encounter (Office Visit) with Werner Lean, MD.     Allergies:   Iodine, Other, Ampicillin, Contrast media [iodinated diagnostic agents], Levofloxacin, and Septra [bactrim]   Social History   Socioeconomic History  . Marital status: Married    Spouse name: Not on file  . Number of children: Not on file  . Years of education: Not on file  . Highest education level: Not on file  Occupational History  . Not on file  Tobacco Use  . Smoking status: Former Research scientist (life sciences)  . Smokeless tobacco: Never Used  Vaping Use  .  Vaping Use: Never used  Substance and Sexual Activity  . Alcohol use: Yes    Alcohol/week: 0.0 standard drinks    Comment: Rare  . Drug use: No  . Sexual activity: Yes    Birth control/protection: Post-menopausal    Comment:  declined sexual hx questions,des neg  Other Topics Concern  . Not on file  Social History Narrative  . Not on file   Social Determinants of Health   Financial Resource Strain: Not on file  Food Insecurity: Not on file  Transportation Needs: Not on file  Physical Activity: Not on file  Stress: Not on file  Social Connections: Not on file     SOCIAL: Has four kids  Family History: The patient's family history includes Arthritis in her sister; Autoimmune disease in her father; Breast cancer in her maternal grandmother; Breast cancer (age of onset: 37) in her sister; CVA in her father; Stroke in her father; Thyroid disease in her daughter and sister; Tremor in her mother.  History of coronary artery disease notable for no members. History of heart failure notable for no members. No history of cardiomyopathies including hypertrophic cardiomyopathy, left ventricular non-compaction, or arrhythmogenic right ventricular cardiomyopathy. History of arrhythmia notable for atrial fibrillation in father. Denies family history of sudden cardiac death including drowning, car accidents, or unexplained deaths in the family.  Father died after being his by a drunk driver at age 36. No history of bicuspid aortic valve or aortic aneurysm or dissection.   ROS:   Please see the history of present illness.     All other systems reviewed and are negative.  EKGs/Labs/Other Studies Reviewed:    The following studies were reviewed today:  EKG:   04/13/2016: Sinus Bradycardia Rate 53 WNL  Cardiac Event Monitoring: Results: Per report new PSVT (unable to pull up tracings)  Transthoracic Echocardiogram: Date: 04/11/2016 Results: Mild aortic root dilation AAHI: 2.4 cm/m Study Conclusions   - Left ventricle: The cavity size was normal. Wall thickness was  normal. Systolic function was normal. The estimated ejection  fraction was in the range of 55% to 60%. Wall motion was normal;  there were no regional wall motion abnormalities. Left  ventricular diastolic function parameters were normal.  - Aortic root: The aortic root was mildly dilated.    Recent Labs: No results found for requested labs within last 8760 hours.  Recent Lipid Panel    Component Value Date/Time   CHOL 147 04/07/2016 0331   TRIG 57 04/07/2016 0331   HDL 48  04/07/2016 0331   CHOLHDL 3.1 04/07/2016 0331   VLDL 11 04/07/2016 0331   LDLCALC 88 04/07/2016 [facility]  or Outside Clinic Studies (OSH):  Date: 05/18/2020 Cholesterol 205 HDL 71 LDL 111 Tgs 134 Creatinine 0.65 TSH NA BNP NA   Risk Assessment/Calculations:     ASCVD Risk 1.2%  Physical Exam:    VS:  BP 90/64   Pulse 64   Ht 5' 5.5" (1.664 m)   Wt 150 lb (68 kg)   BMI 24.58 kg/m     Wt Readings from Last 3 Encounters:  10/30/20 150 lb (68 kg)  06/06/20 152 lb (68.9 kg)  05/27/19 142 lb (64.4 kg)     GEN:  Well nourished, well developed in no acute distress HEENT: Normal, no bifid uvula NECK: No JVD; No carotid bruits LYMPHATICS: No lymphadenopathy CARDIAC: RRR, no murmurs, rubs, gallops RESPIRATORY:  Clear to auscultation without rales, wheezing or rhonchi  ABDOMEN: Soft,  non-tender, non-distended MUSCULOSKELETAL:  No edema; No deformity  SKIN: Warm and dry NEUROLOGIC:  Alert and oriented x 3 PSYCHIATRIC:  Normal affect   ASSESSMENT:    1. Chest pain of uncertain etiology   2. Thoracic aortic aneurysm without rupture (HCC)   3. Paroxysmal SVT (supraventricular tachycardia) (HCC)   4. PAC (premature atrial contraction)   5. Near syncope    PLAN:    In order of problems listed above:  Chest Pain Syndrome with near syncope while driving (possible vasovagal component) - The patient presents with atypical chest pain, but one event occurred while driving - EKG shows  without evidence of accessory pathway, ventricular pacing, digoxin use, LBBB, or baseline ST changes. - has contrast allergy and I have concerns that a NM Stress test would lead to a breast related artifact - Would recommend an echocardiogram to assess LVEF and exclude WMA.  -- Would recommend exercise echocardiogram stress test (NPO at midnight); discussed risks, benefits, and alternatives of the diagnostic procedure including chest pain, arrhythmia, and death.  Patient amenable  for testing. - if positive, discussed risks and benefits of cardiac catheterization have been discussed with the patient.  These include bleeding, infection, kidney damage, stroke, heart attack, death.  The patient understands these risks and is willing to proceed if necessary - stress the importance of salt and water intake - gave education on slow rise, Valsalva maneuver exacerbation, temperature change - discussed muscle contraction and leg crossing  Asymptomatic thoracic aortic aneurysm - Last at 40 mm, rate of growth unclear, indexed diameter 2.4 cm/m - Will get echocardiogram  - 1st degree relative one time screening  - Discussed not using Fluoroquinolones - Arm/Leg BP Differential < 20 mm Hg; not suggestive of coarctation  PACs and SVT - stable- given BP will defer AV nodal agents  Three months follow up unless new symptoms or abnormal test results warranting change in plan  Would be reasonable for  Video Visit Follow up  Would be reasonable for  APP Follow up  Shared Decision Making/Informed Consent The risks [chest pain, shortness of breath, cardiac arrhythmias, dizziness, blood pressure fluctuations, myocardial infarction, stroke/transient ischemic attack, and life-threatening complications (estimated to be 1 in 10,000)], benefits (risk stratification, diagnosing coronary artery disease, treatment guidance) and alternatives of a stress or dobutamine stress echocardiogram were discussed in detail with Ms. Grout and she agrees to proceed.    Time Spent Directly with Patient:   I have spent a total of 60 minutes with the patient reviewing  notes,  EKGs, labs, prior imaging and examining the patient as well as establishing an assessment and plan that was discussed personally with the patient.  > 50% of time was spent in direct patient care, discussing thoracic aortic dilations and near syncope.   Medication Adjustments/Labs and Tests Ordered: Current medicines are reviewed  at length with the patient today.  Concerns regarding medicines are outlined above.  Orders Placed This Encounter  Procedures  . Cardiac Stress Test: Informed Consent Details: Physician/Practitioner Attestation; Transcribe to consent form and obtain patient signature  . Cardiac Stress Test: Informed Consent Details: Physician/Practitioner Attestation; Transcribe to consent form and obtain patient signature  . EKG 12-Lead  . ECHOCARDIOGRAM STRESS TEST  . ECHOCARDIOGRAM COMPLETE   No orders of the defined types were placed in this encounter.   Patient Instructions  Medication Instructions:  Your physician recommends that you continue on your current medications as directed. Please refer to the Current Medication list given to you  today.  *If you need a refill on your cardiac medications before your next appointment, please call your pharmacy*   Lab Work: NONE If you have labs (blood work) drawn today and your tests are completely normal, you will receive your results only by: Marland Kitchen MyChart Message (if you have MyChart) OR . A paper copy in the mail If you have any lab test that is abnormal or we need to change your treatment, we will call you to review the results.   Testing/Procedures: Your physician has requested that you have a stress echocardiogram.        COVID TEST--___________-- Dennis Bast will go to [facility]. [place], [place] [address]  for your Covid testing.   This is a drive thru test site.   Be sure to share with the first checkpoint that you are there for pre-procedure/surgery testing. Stay in your car and the nurse team will come to your car to test you.  After you are tested please go home and self-quarantine until the day of your procedure.      Your physician has requested that you have an echocardiogram. Echocardiography is a painless test that uses sound waves to create images of your heart. It provides your doctor with information about the size and shape of your  heart and how well your heart's chambers and valves are working. This procedure takes approximately one hour. There are no restrictions for this procedure.       Follow-Up: At [facility], you and your health needs are our priority.  As part of our continuing mission to provide you with exceptional heart care, we have created designated Provider Care Teams.  These Care Teams include your primary Cardiologist (physician) and Advanced Practice Providers (APPs -  Physician Assistants and Nurse Practitioners) who all work together to provide you with the care you need, when you need it.    Your next appointment:   3 month(s)  The format for your next appointment:   In Person  Provider:   You may see Gasper Sells, MD or one of the following Advanced Practice Providers on your designated Care Team:    Melina Copa, PA-C  Ermalinda Barrios, PA-C         Signed, Werner Lean, MD  10/30/2020 3:32 PM    [place]

## 2020-10-31 ENCOUNTER — Other Ambulatory Visit: Payer: Self-pay | Admitting: *Deleted

## 2020-10-31 DIAGNOSIS — I712 Thoracic aortic aneurysm, without rupture, unspecified: Secondary | ICD-10-CM

## 2020-10-31 DIAGNOSIS — R079 Chest pain, unspecified: Secondary | ICD-10-CM

## 2020-10-31 DIAGNOSIS — I471 Supraventricular tachycardia: Secondary | ICD-10-CM

## 2020-11-07 ENCOUNTER — Ambulatory Visit (HOSPITAL_COMMUNITY)
Admission: RE | Admit: 2020-11-07 | Discharge: 2020-11-07 | Disposition: A | Discharge status: Home / Self Care | Payer: 59 | Source: Ambulatory Visit | Attending: Internal Medicine | Admitting: Internal Medicine

## 2020-11-07 ENCOUNTER — Other Ambulatory Visit: Payer: Self-pay

## 2020-11-07 DIAGNOSIS — I712 Thoracic aortic aneurysm, without rupture, unspecified: Secondary | ICD-10-CM

## 2020-11-07 LAB — ECHOCARDIOGRAM COMPLETE
Area-P 1/2: 2.62 cm2
S' Lateral: 2.7 cm

## 2020-11-07 NOTE — Progress Notes (Signed)
  Echocardiogram 2D Echocardiogram has been performed.  Laurie Terry 11/07/2020, 9:04 AM

## 2020-11-08 ENCOUNTER — Other Ambulatory Visit: Payer: Self-pay

## 2020-11-08 DIAGNOSIS — I712 Thoracic aortic aneurysm, without rupture, unspecified: Secondary | ICD-10-CM

## 2020-11-08 DIAGNOSIS — I77819 Aortic ectasia, unspecified site: Secondary | ICD-10-CM

## 2020-11-09 ENCOUNTER — Telehealth: Payer: Self-pay | Admitting: Internal Medicine

## 2020-11-09 NOTE — Telephone Encounter (Signed)
Spoke with patient regarding scheduled appointment 11/22/20 at 9:00 am at [facility] for the MRA chest ordered by Dr. Rodman Key time is 8:30 am--1st floor admissions office for check in ---patient scheduled for lab work 11/20/20 at 3:45 pm at our [facility] location.  Informations is also in My Chart.

## 2020-11-16 ENCOUNTER — Telehealth (HOSPITAL_COMMUNITY): Payer: Self-pay | Admitting: *Deleted

## 2020-11-16 ENCOUNTER — Other Ambulatory Visit (HOSPITAL_COMMUNITY)
Admission: RE | Admit: 2020-11-16 | Discharge: 2020-11-16 | Disposition: A | Discharge status: Home / Self Care | Payer: 59 | Source: Ambulatory Visit | Attending: Internal Medicine | Admitting: Internal Medicine

## 2020-11-16 ENCOUNTER — Other Ambulatory Visit (HOSPITAL_COMMUNITY): Payer: 59

## 2020-11-16 DIAGNOSIS — Z01812 Encounter for preprocedural laboratory examination: Secondary | ICD-10-CM | POA: Diagnosis not present

## 2020-11-16 DIAGNOSIS — Z20822 Contact with and (suspected) exposure to covid-19: Secondary | ICD-10-CM | POA: Diagnosis not present

## 2020-11-16 LAB — SARS CORONAVIRUS 2 (TAT 6-24 HRS): SARS Coronavirus 2: NEGATIVE

## 2020-11-16 NOTE — Telephone Encounter (Signed)
  Patient given instructions for stress echo. Laurie Terry

## 2020-11-17 ENCOUNTER — Other Ambulatory Visit (HOSPITAL_COMMUNITY): Payer: 59

## 2020-11-18 ENCOUNTER — Ambulatory Visit (HOSPITAL_COMMUNITY): Payer: 59

## 2020-11-20 ENCOUNTER — Ambulatory Visit (HOSPITAL_COMMUNITY): Payer: 59

## 2020-11-20 ENCOUNTER — Other Ambulatory Visit: Payer: 59 | Admitting: *Deleted

## 2020-11-20 ENCOUNTER — Other Ambulatory Visit: Payer: Self-pay

## 2020-11-20 ENCOUNTER — Ambulatory Visit (HOSPITAL_COMMUNITY): Payer: 59 | Attending: Cardiology

## 2020-11-20 DIAGNOSIS — R079 Chest pain, unspecified: Secondary | ICD-10-CM | POA: Insufficient documentation

## 2020-11-20 DIAGNOSIS — I77819 Aortic ectasia, unspecified site: Secondary | ICD-10-CM

## 2020-11-21 LAB — BASIC METABOLIC PANEL
BUN/Creatinine Ratio: 21 (ref 9–23)
BUN: 14 mg/dL (ref 6–24)
CO2: 22 mmol/L (ref 20–29)
Calcium: 9.6 mg/dL (ref 8.7–10.2)
Chloride: 101 mmol/L (ref 96–106)
Creatinine, Ser: 0.67 mg/dL (ref 0.57–1.00)
Glucose: 86 mg/dL (ref 65–99)
Potassium: 4.7 mmol/L (ref 3.5–5.2)
Sodium: 143 mmol/L (ref 134–144)
eGFR: 103 mL/min/{1.73_m2} (ref >59)

## 2020-11-22 ENCOUNTER — Other Ambulatory Visit: Payer: Self-pay

## 2020-11-22 ENCOUNTER — Ambulatory Visit (HOSPITAL_COMMUNITY)
Admission: RE | Admit: 2020-11-22 | Discharge: 2020-11-22 | Disposition: A | Discharge status: Home / Self Care | Payer: 59 | Source: Ambulatory Visit | Attending: Internal Medicine | Admitting: Internal Medicine

## 2020-11-22 DIAGNOSIS — I712 Thoracic aortic aneurysm, without rupture, unspecified: Secondary | ICD-10-CM

## 2020-11-22 MED ORDER — GADOBUTROL 1 MMOL/ML IV SOLN
7.0000 mL | Freq: Once | INTRAVENOUS | Status: AC | PRN
  Administered 2020-11-22: 7 mL via INTRAVENOUS

## 2020-11-28 ENCOUNTER — Ambulatory Visit: Payer: 59 | Admitting: Internal Medicine

## 2020-11-28 ENCOUNTER — Other Ambulatory Visit: Payer: Self-pay

## 2020-11-28 ENCOUNTER — Telehealth: Payer: Self-pay

## 2020-11-28 ENCOUNTER — Encounter: Payer: Self-pay | Admitting: Internal Medicine

## 2020-11-28 VITALS — BP 100/78 | HR 85 | Ht 65.0 in | Wt 150.0 lb

## 2020-11-28 DIAGNOSIS — I491 Atrial premature depolarization: Secondary | ICD-10-CM

## 2020-11-28 DIAGNOSIS — I471 Supraventricular tachycardia: Secondary | ICD-10-CM

## 2020-11-28 DIAGNOSIS — I712 Thoracic aortic aneurysm, without rupture, unspecified: Secondary | ICD-10-CM

## 2020-11-28 DIAGNOSIS — R079 Chest pain, unspecified: Secondary | ICD-10-CM

## 2020-11-28 DIAGNOSIS — Z8279 Family history of other congenital malformations, deformations and chromosomal abnormalities: Secondary | ICD-10-CM | POA: Diagnosis not present

## 2020-11-28 DIAGNOSIS — E7849 Other hyperlipidemia: Secondary | ICD-10-CM | POA: Insufficient documentation

## 2020-11-28 MED ORDER — METOPROLOL TARTRATE 25 MG PO TABS: ORAL_TABLET | ORAL | 1 refills | Status: DC

## 2020-11-28 NOTE — Patient Instructions (Addendum)
Medication Instructions:  START: metoprolol 12.23m by mouth every 6 hours as needed for palpitations *If you need a refill on your cardiac medications before your next appointment, please call your pharmacy*   Lab Work: TODAY: ESR and CRP If you have labs (blood work) drawn today and your tests are completely normal, you will receive your results only by: .Marland KitchenMyChart Message (if you have MyChart) OR . A paper copy in the mail If you have any lab test that is abnormal or we need to change your treatment, we will call you to review the results.   Testing/Procedures: Your physician has requested that you have an echocardiogram in 6 months. Echocardiography is a painless test that uses sound waves to create images of your heart. It provides your doctor with information about the size and shape of your heart and how well your heart's chambers and valves are working. This procedure takes approximately one hour. There are no restrictions for this procedure.  Your physician has requested that you have a cardiac MRA in 11 months.       Follow-Up: [facility] and your [facility] needs are our priority.  As part of our continuing mission to provide you with exceptional heart care, we have created designated Provider Care Teams.  These Care Teams include your primary Cardiologist (physician) and Advanced Practice Providers (APPs -  Physician Assistants and Nurse Practitioners) who all work together to provide you with the care you need, when you need it.  We recommend signing up for the patient portal called "MyChart".  Sign up information is provided on this After Visit Summary.  MyChart is used to connect with patients for Virtual Visits (Telemedicine).  Patients are able to view lab/test results, encounter notes, upcoming appointments, etc.  Non-urgent messages can be sent to your provider as well.   To learn more about what you can do with MyChart, go to [email or web address]    Your next  appointment:   2 month(s) already scheduled  The format for your next appointment:   In Person  Provider:   You may see  CGasper Sells MD or one of the following Advanced Practice Providers on your designated Care Team:    DMelina Copa PA-C  MErmalinda Barrios PA-C    Other Instructions You discussed with Dr. CGasper Sellsregarding not taking fluoroquinolones.

## 2020-11-28 NOTE — Addendum Note (Signed)
Addended by: Precious Gilding on: 11/28/2020 10:50 AM   Modules accepted: Orders

## 2020-11-28 NOTE — Telephone Encounter (Signed)
Spoke with [facility] staff to clarify prescription sent in earlier.  Order is for metoprolol 12.5 mg PO Q6H PRN heart palpitations.  Staff read back order and will fix.

## 2020-11-28 NOTE — Progress Notes (Signed)
Cardiology Office Note:    Date:  11/28/2020   ID:  Laurie Terry, DOB 09/04/1966, MRN [id number]  PCP:  [name], MD  [facility] Cardiologist:  Rudean Haskell MD [place] Electrophysiologist:  None   CC: Follow up Chest pain  History of Present Illness:    Laurie Terry is a 55 y.o. female with a hx of Paroxysmal SVT, PACs, Gestational DM with fourth child,  Mild thoracic aortic aneurysm- contrast allergy who presents for evaluation 10/30/20.  In interim of this visit, patient echo showed similar AA dilation.  Negative stress echo.  Oncological History notable for: Malignancies: Melanoma Surgery: Nail bed removal 2000  Patient notes that she is doing OK.  Since last visit notes return of constant chest pain but also had a a tearing chest [place] the airport.  Chest pain occurs spontaneously.  No change with activity.  The tearing pain has not happened since last weekend.    There are no interval hospital/ED visit.    No SOB/DOE and no PND/Orthopnea.  No weight gain or leg swelling.  Notes that she has had an increase in her palpitations. With this- unclear if this is stress related.  Past Medical History:  Diagnosis Date  . Allergic rhinitis   . Cancer ([place])    Melanoma  . Cervical dysplasia 2007   low-grade SIL, normal Pap smears since then  . Family history of adverse reaction to anesthesia    mom has h/o post op nausea/vomiting  . Intermittent palpitations   . IUD    inserted 03/2002.04/06/2007, 06/03/2012  . Ocular migraine   . Osteomalacia   . Pancreatitis   . Seafood allergy     Past Surgical History:  Procedure Laterality Date  . BUNIONECTOMY  2018  . CHOLECYSTECTOMY N/A 04/09/2016   Procedure: LAPAROSCOPIC CHOLECYSTECTOMY ;  Surgeon: Rolm Bookbinder, MD;  Location: [place];  Service: General;  Laterality: N/A;  . ERCP  04/11/2016   ERCP with pancreatic stent and failed CBD stent after needle knife and conventional  sphincterotomy  . ERCP N/A 04/12/2016   Procedure: ENDOSCOPIC RETROGRADE CHOLANGIOPANCREATOGRAPHY (ERCP);  Surgeon: Clarene Essex, MD;  [facility] END[facility];  Service: Endoscopy;  Laterality: N/A;  . FOOT SURGERY     toenail  . INTRAUTERINE DEVICE INSERTION  08/07/2017   Mirena  . removal of melanoma      Current Medications: Current Meds  Medication Sig  . Adapalene 0.3 % gel Apply topically 2 (two) times a week.  . eletriptan (RELPAX) 40 MG tablet as needed.  Marland Kitchen EPINEPHrine 0.3 mg/0.3 mL IJ SOAJ injection as needed.  . fluticasone (FLONASE) 50 MCG/ACT nasal spray as needed.  . metoprolol tartrate (LOPRESSOR) 25 MG tablet Take as needed for palpitations     Allergies:   Iodine, Other, Ampicillin, Avocado, Contrast media [iodinated diagnostic agents], Levofloxacin, Septra [bactrim], and Sulfamethoxazole-trimethoprim   Social History   Socioeconomic History  . Marital status: Married    Spouse name: Not on file  . Number of children: Not on file  . Years of education: Not on file  . Highest education level: Not on file  Occupational History  . Not on file  Tobacco Use  . Smoking status: Former Research scientist (life sciences)  . Smokeless tobacco: Never Used  Vaping Use  . Vaping Use: Never used  Substance and Sexual Activity  . Alcohol use: Yes    Alcohol/week: 0.0 standard drinks    Comment: Rare  . Drug use: No  .  Sexual activity: Yes    Birth control/protection: Post-menopausal    Comment:  declined sexual hx questions,des neg  Other Topics Concern  . Not on file  Social History Narrative  . Not on file   Social Determinants of Health   Financial Resource Strain: Not on file  Food Insecurity: Not on file  Transportation Needs: Not on file  Physical Activity: Not on file  Stress: Not on file  Social Connections: Not on file    SOCIAL: Has four kids; one daughter lives in [place]  Family History: The patient's family history includes Arthritis in her sister; Autoimmune disease in  her father; Breast cancer in her maternal grandmother; Breast cancer (age of onset: 30) in her sister; CVA in her father; Stroke in her father; Thyroid disease in her daughter and sister; Tremor in her mother.  History of coronary artery disease notable for no members. History of heart failure notable for no members. No history of cardiomyopathies including hypertrophic cardiomyopathy, left ventricular non-compaction, or arrhythmogenic right ventricular cardiomyopathy. History of arrhythmia notable for atrial fibrillation in father. Denies family history of sudden cardiac death including drowning, car accidents, or unexplained deaths in the family.  Father died after being his by a drunk driver at age 16. Father had bicuspid aortic valve and aneurysm without dissection.   ROS:   Please see the history of present illness.     All other systems reviewed and are negative.  EKGs/Labs/Other Studies Reviewed:    The following studies were reviewed today:  EKG:   04/13/2016: Sinus Bradycardia Rate 53 WNL  Cardiac Event Monitoring: Results: Per report new PSVT (unable to pull up tracings)  Transthoracic Echocardiogram: Date: 11/07/20 Results: Mild TAA 1. Left ventricular ejection fraction, by estimation, is 60 to 65%. The  left ventricle has normal function. The left ventricle has no regional  wall motion abnormalities. Left ventricular diastolic parameters were  normal.  2. Right ventricular systolic function is normal. The right ventricular  size is normal.  3. The mitral valve is normal in structure. Trivial mitral valve  regurgitation. No evidence of mitral stenosis.  4. The aortic valve is tricuspid. Aortic valve regurgitation is not  visualized. No aortic stenosis is present.  5. Aortic dilatation noted. There is mild to moderate dilatation at the  level of the sinuses of Valsalva, measuring 43 mm. There is mild  dilatation of the ascending aorta, measuring 39 mm.  6. The  inferior vena cava is normal in size with greater than 50%  respiratory variability, suggesting right atrial pressure of 3 mmHg.   Aortia MRA: Date: 11/22/20 Results: IMPRESSION:   - Aortic root is upper limit of normal (39 mm on my assessment). - Ascending aorta normal diameter for gender. - No evidence of Coarctation - Top-normal caliber of the aortic root by MRI measuring up to 3.8-4.0 cm at the level of the sinuses of Valsalva. No evidence of aneurysmal disease of the ascending thoracic aorta by MRI/MRA.  Recent Labs: 11/20/2020: BUN 14; Creatinine, Ser 0.67; Potassium 4.7; Sodium 143  Recent Lipid Panel    Component Value Date/Time   CHOL 147 04/07/2016 0331   TRIG 57 04/07/2016 0331   HDL 48 04/07/2016 0331   CHOLHDL 3.1 04/07/2016 0331   VLDL 11 04/07/2016 0331   LDLCALC 88 04/07/2016 [facility]  or Outside Clinic Studies (OSH):  Date: 05/18/2020 Cholesterol 205 HDL 71 LDL 111 Tgs 134 Creatinine 0.65 TSH NA BNP NA   Risk Assessment/Calculations:  ASCVD Risk 1.2%  Physical Exam:    VS:  BP 100/78   Pulse 85   Ht '5\' 5"'  (1.651 m)   Wt 150 lb (68 kg)   SpO2 98%   BMI 24.96 kg/m     Wt Readings from Last 3 Encounters:  11/28/20 150 lb (68 kg)  10/30/20 150 lb (68 kg)  06/06/20 152 lb (68.9 kg)    GEN:  Well nourished, well developed in no acute distress HEENT: Normal, no bifid uvula NECK: No JVD; No carotid bruits LYMPHATICS: No lymphadenopathy CARDIAC: RRR, no murmurs, rubs, gallops RESPIRATORY:  Clear to auscultation without rales, wheezing or rhonchi  ABDOMEN: Soft, non-tender, non-distended MUSCULOSKELETAL:  No edema; No deformity  SKIN: Warm and dry NEUROLOGIC:  Alert and oriented x 3 PSYCHIATRIC:  Normal affect   ASSESSMENT:    1. Thoracic aortic aneurysm without rupture ([place])   2. Family history of bicuspid aortic valve   3. Chest pain of uncertain etiology   4. Paroxysmal SVT (supraventricular tachycardia) ([place])   5. PAC  (premature atrial contraction)   6. Other hyperlipidemia    PLAN:    In order of problems listed above:  Asymptomatic thoracic aortic dilation Family history of bicuspid aortic valve - Last at 39 mm,  These measurements after been at the upper limit of normal; 11/2018 last assessment - Will reassess MRA in 11 months; after this will consider screening with echocardiogram - SHARED DECISION MAKING: will get echocardiogram in 6 months  - 1st degree relative one time screening  - Discussed not using Fluoroquinolones - Arm/Leg BP Differential < 20 mm Hg; not suggestive of coarctation  Chest pain syndrome - improving but still persistent - likely non- cardiac - will check ESR and CRP  PACs and SVT - stable - will offer BB (had tried metoprolol 12.5 mg PO PRN palpitations)  Hyperlipidemia (mixed) -LDL goal less than 100 - gave education on dietary changes  Time Spent Directly with Patient:   I have spent a total of 40 minutes with the patient reviewing hospital notes, telemetry, EKGs, labs and examining the patient as well as establishing an assessment and plan that was discussed personally with the patient.  > 50% of time was spent in direct patient care and reviewed images with patient.  Discussed lifestyle and education on aortopathy.   One year follow up unless new symptoms or abnormal test results warranting change in plan  Would be reasonable for  Video Visit Follow up Would be reasonable for  APP Follow up  Medication Adjustments/Labs and Tests Ordered: Current medicines are reviewed at length with the patient today.  Concerns regarding medicines are outlined above.  Orders Placed This Encounter  Procedures  . MR Angiogram Chest W Wo Contrast  . Sedimentation rate  . CRP High sensitivity  . ECHOCARDIOGRAM COMPLETE   Meds ordered this encounter  Medications  . metoprolol tartrate (LOPRESSOR) 25 MG tablet    Sig: Take as needed for palpitations    Dispense:  60 tablet     Refill:  1    Patient Instructions  Medication Instructions:  START: metoprolol 12.66m by mouth every 6 hours as needed for palpitations *If you need a refill on your cardiac medications before your next appointment, please call your pharmacy*   Lab Work: TODAY: ESR and CRP If you have labs (blood work) drawn today and your tests are completely normal, you will receive your results only by: .Marland KitchenMyChart Message (if you have MyChart) OR .  A paper copy in the mail If you have any lab test that is abnormal or we need to change your treatment, we will call you to review the results.   Testing/Procedures: Your physician has requested that you have an echocardiogram in 6 months. Echocardiography is a painless test that uses sound waves to create images of your heart. It provides your doctor with information about the size and shape of your heart and how well your heart's chambers and valves are working. This procedure takes approximately one hour. There are no restrictions for this procedure.  Your physician has requested that you have a cardiac MRA in 11 months.       Follow-Up: At [facility], you and your health needs are our priority.  As part of our continuing mission to provide you with exceptional heart care, we have created designated Provider Care Teams.  These Care Teams include your primary Cardiologist (physician) and Advanced Practice Providers (APPs -  Physician Assistants and Nurse Practitioners) who all work together to provide you with the care you need, when you need it.  We recommend signing up for the patient portal called "MyChart".  Sign up information is provided on this After Visit Summary.  MyChart is used to connect with patients for Virtual Visits (Telemedicine).  Patients are able to view lab/test results, encounter notes, upcoming appointments, etc.  Non-urgent messages can be sent to your provider as well.   To learn more about what you can do with MyChart, go  to [email or web address].    Your next appointment:   2 month(s) already scheduled  The format for your next appointment:   In Person  Provider:   You may see  Gasper Sells, MD or one of the following Advanced Practice Providers on your designated Care Team:    Melina Copa, PA-C  Ermalinda Barrios, PA-C    Other Instructions You discussed with Dr. Gasper Sells regarding not taking fluoroquinolones.      Signed, Werner Lean, MD  11/28/2020 10:23 AM    [facility]

## 2020-11-29 LAB — SEDIMENTATION RATE: Sed Rate: 2 mm/hr (ref 0–40)

## 2020-11-29 LAB — HIGH SENSITIVITY CRP: CRP, High Sensitivity: 1.09 mg/L (ref 0.00–3.00)

## 2020-12-01 ENCOUNTER — Ambulatory Visit: Payer: 59 | Admitting: Internal Medicine

## 2020-12-15 ENCOUNTER — Other Ambulatory Visit (HOSPITAL_COMMUNITY): Payer: 59

## 2020-12-18 ENCOUNTER — Other Ambulatory Visit (HOSPITAL_COMMUNITY): Payer: 59

## 2021-02-09 ENCOUNTER — Ambulatory Visit: Payer: 59 | Admitting: Internal Medicine

## 2021-02-20 ENCOUNTER — Other Ambulatory Visit: Payer: Self-pay | Admitting: Obstetrics and Gynecology

## 2021-02-20 DIAGNOSIS — Z1231 Encounter for screening mammogram for malignant neoplasm of breast: Secondary | ICD-10-CM

## 2021-03-21 NOTE — Progress Notes (Signed)
Cardiology Office Note:    Date:  03/22/2021   ID:  Laurie Terry, DOB 08-08-1966, MRN [id number]  PCP:  [name], MD  [facility] Cardiologist:  Rudean Haskell MD [place] Electrophysiologist:  None   CC: Follow up Chest pain  History of Present Illness:    Laurie Terry is a 55 y.o. female with a hx of Paroxysmal SVT, PACs, Gestational DM with fourth child,  Mild thoracic aortic aneurysm- contrast allergy who presents for evaluation 10/30/20.  In interim of this visit, patient echo showed similar AA dilation.  Negative stress echo. Last seen 11/28/20.  In interim of this visit, patient started low dose BB PRN for palpitations.  Oncological History notable for: Malignancies: Melanoma Surgery: Nail bed removal 2000  Patient notes that she is doing good.  Since day last visit notes not needing metoprolol for chest pain.  There are no interval hospital/ED visit.    Constant chest pain.  Constant since she has been up but comes and goes. Has sternal chest pain.  There is no relationship to activity.No SOB/DOE and no PND/Orthopnea.  No weight gain or leg swelling.  No palpitations or syncope, even with low blood pressures.   Past Medical History:  Diagnosis Date   Allergic rhinitis    Cancer ([place])    Melanoma   Cervical dysplasia 2007   low-grade SIL, normal Pap smears since then   Family history of adverse reaction to anesthesia    mom has h/o post op nausea/vomiting   Intermittent palpitations    IUD    inserted 03/2002.04/06/2007, 06/03/2012   Ocular migraine    Osteomalacia    Pancreatitis    Seafood allergy     Past Surgical History:  Procedure Laterality Date   BUNIONECTOMY  2018   CHOLECYSTECTOMY N/A 04/09/2016   Procedure: LAPAROSCOPIC CHOLECYSTECTOMY ;  Surgeon: Rolm Bookbinder, MD;  Location: [place];  Service: General;  Laterality: N/A;   ERCP  04/11/2016   ERCP with pancreatic stent and failed CBD stent after needle knife and  conventional sphincterotomy   ERCP N/A 04/12/2016   Procedure: ENDOSCOPIC RETROGRADE CHOLANGIOPANCREATOGRAPHY (ERCP);  Surgeon: Clarene Essex, MD;  Location: [facility] ENDOSCOPY;  Service: Endoscopy;  Laterality: N/A;   FOOT SURGERY     toenail   INTRAUTERINE DEVICE INSERTION  08/07/2017   Mirena   removal of melanoma      Current Medications: Current Meds  Medication Sig   Adapalene 0.3 % gel Apply topically 2 (two) times a week.   eletriptan (RELPAX) 40 MG tablet as needed.   EPINEPHrine 0.3 mg/0.3 mL IJ SOAJ injection as needed.   fluticasone (FLONASE) 50 MCG/ACT nasal spray as needed.   metoprolol tartrate (LOPRESSOR) 25 MG tablet Take every 6 hours as needed for heart palpitations     Allergies:   Iodine, Other, Ampicillin, Avocado, Contrast media [iodinated diagnostic agents], Levofloxacin, Septra [bactrim], and Sulfamethoxazole-trimethoprim   Social History   Socioeconomic History   Marital status: Married    Spouse name: Not on file   Number of children: Not on file   Years of education: Not on file   Highest education level: Not on file  Occupational History   Not on file  Tobacco Use   Smoking status: Former    Pack years: 0.00   Smokeless tobacco: Never  Vaping Use   Vaping Use: Never used  Substance and Sexual Activity   Alcohol use: Yes    Alcohol/week: 0.0 standard drinks  Comment: Rare   Drug use: No   Sexual activity: Yes    Birth control/protection: Post-menopausal    Comment:  declined sexual hx questions,des neg  Other Topics Concern   Not on file  Social History Narrative   Not on file   Social Determinants of Health   Financial Resource Strain: Not on file  Food Insecurity: Not on file  Transportation Needs: Not on file  Physical Activity: Not on file  Stress: Not on file  Social Connections: Not on file    SOCIAL: Has four kids; one daughter lives in [place]  Family History: The patient's family history includes Arthritis in her sister;  Autoimmune disease in her father; Breast cancer in her maternal grandmother; Breast cancer (age of onset: 74) in her sister; CVA in her father; Stroke in her father; Thyroid disease in her daughter and sister; Tremor in her mother.  History of coronary artery disease notable for no members. History of heart failure notable for no members. No history of cardiomyopathies including hypertrophic cardiomyopathy, left ventricular non-compaction, or arrhythmogenic right ventricular cardiomyopathy. History of arrhythmia notable for atrial fibrillation in father. Denies family history of sudden cardiac death including drowning, car accidents, or unexplained deaths in the family.  Father died after being his by a drunk driver at age 32. Father had bicuspid aortic valve and aneurysm without dissection.   ROS:   Please see the history of present illness.     All other systems reviewed and are negative.  EKGs/Labs/Other Studies Reviewed:    The following studies were reviewed today:  EKG:   04/13/2016: Sinus Bradycardia Rate 53 WNL  Cardiac Event Monitoring: Results: Per report new PSVT (unable to pull up tracings)  Transthoracic Echocardiogram: Date: 11/07/20 Results: Mild TAA 1. Left ventricular ejection fraction, by estimation, is 60 to 65%. The  left ventricle has normal function. The left ventricle has no regional  wall motion abnormalities. Left ventricular diastolic parameters were  normal.   2. Right ventricular systolic function is normal. The right ventricular  size is normal.   3. The mitral valve is normal in structure. Trivial mitral valve  regurgitation. No evidence of mitral stenosis.   4. The aortic valve is tricuspid. Aortic valve regurgitation is not  visualized. No aortic stenosis is present.   5. Aortic dilatation noted. There is mild to moderate dilatation at the  level of the sinuses of Valsalva, measuring 43 mm. There is mild  dilatation of the ascending aorta, measuring  39 mm.   6. The inferior vena cava is normal in size with greater than 50%  respiratory variability, suggesting right atrial pressure of 3 mmHg.   Date 11/20/20 Results: 1. This is a negative stress echocardiogram for ischemia.   2. This is a low risk study.   Aortia MRA: Date: 11/22/20 Results: IMPRESSION:   - Aortic root is upper limit of normal (39 mm on my assessment). - Ascending aorta normal diameter for gender. - No evidence of Coarctation - Top-normal caliber of the aortic root by MRI measuring up to 3.8-4.0 cm at the level of the sinuses of Valsalva. No evidence of aneurysmal disease of the ascending thoracic aorta by MRI/MRA.  Recent Labs: 11/20/2020: BUN 14; Creatinine, Ser 0.67; Potassium 4.7; Sodium 143  Recent Lipid Panel    Component Value Date/Time   CHOL 147 04/07/2016 0331   TRIG 57 04/07/2016 0331   HDL 48 04/07/2016 0331   CHOLHDL 3.1 04/07/2016 0331   VLDL 11 04/07/2016 0331  [place] 88 04/07/2016 [facility]  or Outside Clinic Studies (OSH):  Date: 05/18/2020 Cholesterol 205 HDL 71 LDL 111 Tgs 134 Creatinine 0.65 TSH NA BNP NA   Risk Assessment/Calculations:     ASCVD Risk 1.2%  Physical Exam:    VS:  BP 90/70   Pulse 82   Ht 5\' 5"  (1.651 m)   Wt 70.8 kg   SpO2 97%   BMI 25.96 kg/m     Wt Readings from Last 3 Encounters:  03/22/21 70.8 kg  11/28/20 68 kg  10/30/20 68 kg    GEN:  Well nourished, well developed in no acute distress HEENT: Normal NECK: No JVD LYMPHATICS: No lymphadenopathy CARDIAC: RRR, no murmurs, rubs, gallops RESPIRATORY:  Clear to auscultation without rales, wheezing or rhonchi  ABDOMEN: Soft, non-tender, non-distended MUSCULOSKELETAL:  No edema; No deformity  SKIN: Warm and dry NEUROLOGIC:  Alert and oriented x 3 PSYCHIATRIC:  Normal affect   ASSESSMENT:    1. Thoracic aortic aneurysm without rupture ([place])   2. Family history of bicuspid aortic valve   3. PAC (premature atrial contraction)    4. Paroxysmal SVT (supraventricular tachycardia) ([facility])     PLAN:    In order of problems listed above:  Asymptomatic thoracic aortic dilation with iodine allergy Family history of bicuspid aortic valve PACs and P-SVT Non-cardiac CP Hyperlipidemia (mixed) - Last at 39 mm,  These measurements after been at the upper limit of normal; 11/2021 last assessment - MRA planned for 11/22/20 - Echo for fall (AA) - 1st degree relative one time screening and discussed not using Fluoroquinolones discussed at prior visits -LDL goal less than 100; at next visit will-readdress (she is making diet and exercise changes) - has metoprolol 12.5 mg PO PRN palpitations)  April 2023 follow up unless new symptoms or abnormal test results warranting change in plan     Medication Adjustments/Labs and Tests Ordered: Current medicines are reviewed at length with the patient today.  Concerns regarding medicines are outlined above.  Orders Placed This Encounter  Procedures   MR Angiogram Chest W Contrast   ECHOCARDIOGRAM COMPLETE    No orders of the defined types were placed in this encounter.   Patient Instructions  Medication Instructions:  Your physician recommends that you continue on your current medications as directed. Please refer to the Current Medication list given to you today.  *If you need a refill on your cardiac medications before your next appointment, please call your pharmacy*   Lab Work: NONE If you have labs (blood work) drawn today and your tests are completely normal, you will receive your results only by: [place] (if you have MyChart) OR A paper copy in the mail If you have any lab test that is abnormal or we need to change your treatment, we will call you to review the results.   Testing/Procedures: Your physician has requested that you have an echocardiogram in September. Echocardiography is a painless test that uses sound waves to create images of your heart. It  provides your doctor with information about the size and shape of your heart and how well your heart's chambers and valves are working. This procedure takes approximately one hour. There are no restrictions for this procedure.   Your physician has requested that you have a Cardiac MRA in March of 2023.     Follow-Up: At [facility], you and your health needs are our priority.  As part of our continuing mission to provide you  with exceptional heart care, we have created designated Provider Care Teams.  These Care Teams include your primary Cardiologist (physician) and Advanced Practice Providers (APPs -  Physician Assistants and Nurse Practitioners) who all work together to provide you with the care you need, when you need it.  We recommend signing up for the patient portal called "MyChart".  Sign up information is provided on this After Visit Summary.  MyChart is used to connect with patients for Virtual Visits (Telemedicine).  Patients are able to view lab/test results, encounter notes, upcoming appointments, etc.  Non-urgent messages can be sent to your provider as well.   To learn more about what you can do with MyChart, go to [email or web address].    Your next appointment:   10 month(s)  The format for your next appointment:   In Person  Provider:   You may see Rudean Haskell, MD or one of the following Advanced Practice Providers on your designated Care Team:   Melina Copa, PA-C Ermalinda Barrios, PA-C        Signed, Werner Lean, MD  03/22/2021 9:16 AM    [place]

## 2021-03-22 ENCOUNTER — Encounter: Payer: Self-pay | Admitting: Internal Medicine

## 2021-03-22 ENCOUNTER — Ambulatory Visit: Payer: 59 | Admitting: Internal Medicine

## 2021-03-22 ENCOUNTER — Other Ambulatory Visit: Payer: Self-pay

## 2021-03-22 VITALS — BP 90/70 | HR 82 | Ht 65.0 in | Wt 156.0 lb

## 2021-03-22 DIAGNOSIS — I491 Atrial premature depolarization: Secondary | ICD-10-CM

## 2021-03-22 DIAGNOSIS — I712 Thoracic aortic aneurysm, without rupture, unspecified: Secondary | ICD-10-CM

## 2021-03-22 DIAGNOSIS — I471 Supraventricular tachycardia: Secondary | ICD-10-CM

## 2021-03-22 DIAGNOSIS — Z8279 Family history of other congenital malformations, deformations and chromosomal abnormalities: Secondary | ICD-10-CM | POA: Diagnosis not present

## 2021-03-22 DIAGNOSIS — E7849 Other hyperlipidemia: Secondary | ICD-10-CM

## 2021-03-22 NOTE — Patient Instructions (Signed)
Medication Instructions:  Your physician recommends that you continue on your current medications as directed. Please refer to the Current Medication list given to you today.  *If you need a refill on your cardiac medications before your next appointment, please call your pharmacy*   Lab Work: NONE If you have labs (blood work) drawn today and your tests are completely normal, you will receive your results only by: [place] (if you have MyChart) OR A paper copy in the mail If you have any lab test that is abnormal or we need to change your treatment, we will call you to review the results.   Testing/Procedures: Your physician has requested that you have an echocardiogram in September. Echocardiography is a painless test that uses sound waves to create images of your heart. It provides your doctor with information about the size and shape of your heart and how well your heart's chambers and valves are working. This procedure takes approximately one hour. There are no restrictions for this procedure.   Your physician has requested that you have a Cardiac MRA in March of 2023.     Follow-Up: At [facility] you and your health needs are our priority.  As part of our continuing mission to provide you with exceptional heart care, we have created designated Provider Care Teams.  These Care Teams include your primary Cardiologist (physician) and Advanced Practice Providers (APPs -  Physician Assistants and Nurse Practitioners) who all work together to provide you with the care you need, when you need it.  We recommend signing up for the patient portal called "MyChart".  Sign up information is provided on this After Visit Summary.  MyChart is used to connect with patients for Virtual Visits (Telemedicine).  Patients are able to view lab/test results, encounter notes, upcoming appointments, etc.  Non-urgent messages can be sent to your provider as well.   To learn more about what you can do  with MyChart, go to [email or web address].    Your next appointment:   10 month(s)  The format for your next appointment:   In Person  Provider:   You may see Rudean Haskell, MD or one of the following Advanced Practice Providers on your designated Care Team:   Melina Copa, PA-C Ermalinda Barrios, PA-C

## 2021-04-17 ENCOUNTER — Ambulatory Visit
Admission: RE | Admit: 2021-04-17 | Discharge: 2021-04-17 | Disposition: A | Discharge status: Home / Self Care | Payer: 59 | Source: Ambulatory Visit | Attending: Obstetrics and Gynecology | Admitting: Obstetrics and Gynecology

## 2021-04-17 ENCOUNTER — Other Ambulatory Visit: Payer: Self-pay

## 2021-04-17 DIAGNOSIS — Z1231 Encounter for screening mammogram for malignant neoplasm of breast: Secondary | ICD-10-CM

## 2021-04-19 ENCOUNTER — Telehealth: Payer: Self-pay

## 2021-04-19 NOTE — Telephone Encounter (Signed)
 "  Salvadore Dom, MD  [facility]  She is due for her annual breast MRI in 12/22, please schedule.

## 2021-05-17 ENCOUNTER — Other Ambulatory Visit: Payer: Self-pay

## 2021-05-17 ENCOUNTER — Ambulatory Visit: Payer: 59 | Admitting: Obstetrics and Gynecology

## 2021-05-17 ENCOUNTER — Encounter: Payer: Self-pay | Admitting: Obstetrics and Gynecology

## 2021-05-17 VITALS — BP 110/60 | HR 64 | Ht 65.5 in | Wt 154.0 lb

## 2021-05-17 DIAGNOSIS — R32 Unspecified urinary incontinence: Secondary | ICD-10-CM

## 2021-05-17 DIAGNOSIS — N3281 Overactive bladder: Secondary | ICD-10-CM | POA: Diagnosis not present

## 2021-05-17 DIAGNOSIS — R159 Full incontinence of feces: Secondary | ICD-10-CM | POA: Diagnosis not present

## 2021-05-17 DIAGNOSIS — R3915 Urgency of urination: Secondary | ICD-10-CM

## 2021-05-17 NOTE — Patient Instructions (Signed)
Overactive Bladder, Adult  Overactive bladder is a condition in which a person has a sudden and frequent need to urinate. A person might also leak urine if he or she cannot get to the bathroom fast enough (urinary incontinence). Sometimes, symptoms can interfere with work or social activities. What are the causes? Overactive bladder is associated with poor nerve signals between your bladder and your brain. Your bladder may get the signal to empty before it is full. You may also have very sensitive muscles that make your bladder squeeze too soon. This condition may also be caused by other factors, such as: Medical conditions: Urinary tract infection. Infection of nearby tissues. Prostate enlargement. Bladder stones, inflammation, or tumors. Diabetes. Muscle or nerve weakness, especially from these conditions: A spinal cord injury. Stroke. Multiple sclerosis. Parkinson's disease. Other causes: Surgery on the uterus or urethra. Drinking too much caffeine or alcohol. Certain medicines, especially those that eliminate extra fluid in the body (diuretics). Constipation. What increases the risk? You may be at greater risk for overactive bladder if you: Are an older adult. Smoke. Are going through menopause. Have prostate problems. Have a neurological disease, such as stroke, dementia, Parkinson's disease, or multiple sclerosis (MS). Eat or drink alcohol, spicy food, caffeine, and other things that irritate the bladder. Are overweight or obese. What are the signs or symptoms? Symptoms of this condition include a sudden, strong urge to urinate. Other symptoms include: Leaking urine. Urinating 8 or more times a day. Waking up to urinate 2 or more times overnight. How is this diagnosed? This condition may be diagnosed based on: Your symptoms and medical history. A physical exam. Blood or urine tests to check for possible causes, such as infection. You may also need to see a health care  provider who specializes in urinarytract problems. This is called a urologist. How is this treated? Treatment for overactive bladder depends on the cause of your condition and whether it is mild or severe. Treatment may include: Bladder training, such as: Learning to control the urge to urinate by following a schedule to urinate at regular intervals. Doing Kegel exercises to strengthen the pelvic floor muscles that support your bladder. Special devices, such as: Biofeedback. This uses sensors to help you become aware of your body's signals. Electrical stimulation. This uses electrodes placed inside the body (implanted) or outside the body. These electrodes send gentle pulses of electricity to strengthen the nerves or muscles that control the bladder. Women may use a plastic device, called a pessary, that fits into the vagina and supports the bladder. Medicines, such as: Antibiotics to treat bladder infection. Antispasmodics to stop the bladder from releasing urine at the wrong time. Tricyclic antidepressants to relax bladder muscles. Injections of botulinum toxin type A directly into the bladder tissue to relax bladder muscles. Surgery, such as: A device may be implanted to help manage the nerve signals that control urination. An electrode may be implanted to stimulate electrical signals in the bladder. A procedure may be done to change the shape of the bladder. This is done only in very severe cases. Follow these instructions at home: Eating and drinking  Make diet or lifestyle changes recommended by your health care provider. These may include: Drinking fluids throughout the day and not only with meals. Cutting down on caffeine or alcohol. Eating a healthy and balanced diet to prevent constipation. This may include: Choosing foods that are high in fiber, such as beans, whole grains, and fresh fruits and vegetables. Limiting foods that are  high in fat and processed sugars, such as fried  and sweet foods.  Lifestyle  Lose weight if needed. Do not use any products that contain nicotine or tobacco. These include cigarettes, chewing tobacco, and vaping devices, such as e-cigarettes. If you need help quitting, ask your health care provider.  General instructions Take over-the-counter and prescription medicines only as told by your health care provider. If you were prescribed an antibiotic medicine, take it as told by your health care provider. Do not stop taking the antibiotic even if you start to feel better. Use any implants or pessary as told by your health care provider. If needed, wear pads to absorb urine leakage. Keep a log to track how much and when you drink, and when you need to urinate. This will help your health care provider monitor your condition. Keep all follow-up visits. This is important. Contact a health care provider if: You have a fever or chills. Your symptoms do not get better with treatment. Your pain and discomfort get worse. You have more frequent urges to urinate. Get help right away if: You are not able to control your bladder. Summary Overactive bladder refers to a condition in which a person has a sudden and frequent need to urinate. Several conditions may lead to an overactive bladder. Treatment for overactive bladder depends on the cause and severity of your condition. Making lifestyle changes, doing Kegel exercises, keeping a log, and taking medicines can help with this condition. This information is not intended to replace advice given to you by your health care provider. Make sure you discuss any questions you have with your healthcare provider. Document Revised: 05/29/2020 Document Reviewed: 05/29/2020 Elsevier Patient Education  [place].  Fecal Incontinence Fecal incontinence, also called accidental bowel leakage, is not being able to control your bowels. This condition happens because the nerves or muscles around the anus do  not work the way they should. This affects their ability to hold stool (feces). What are the causes? This condition may be caused by: Damage to the muscles at the end of the rectum (sphincter). Damage to the nerves that control bowel movements. Diarrhea. Chronic constipation. Pelvic floor dysfunction. This means the muscles in the pelvis do not work well. Loss of bowel storage capacity. This occurs when the rectum can no longer stretch in size in order to store feces. Inflammatory bowel disease (IBD), such as Crohn's disease. Irritable bowel syndrome (IBS). What increases the risk? You are more likely to develop this condition if you: Were born with bowels or a pelvis that did not form correctly. Have had rectal surgery. Have had radiation treatment for certain cancers. Have been pregnant, had a vaginal delivery, or had surgery that damaged the pelvic floor muscles. Had a complicated childbirth, spinal cord injury, or other trauma that caused nerve damage. Have a condition that can affect nerve function, such as diabetes, Parkinson's disease, or multiple sclerosis. Have a condition where the rectum drops down into the anus or vagina (prolapse). Are 74 years of age or older. What are the signs or symptoms? The main symptom of this condition is not being able to control your bowels.You also might not be able to get to the bathroom before a bowel movement. How is this diagnosed? This condition is diagnosed with a medical history and physical exam. You may also have other tests, including: Blood tests. Urine tests. A rectal exam. Ultrasound. MRI. Colonoscopy. This is an exam that looks at your large intestine (colon). Anal  manometry. This is a test that measures the strength of the anal sphincter. Anal electromyogram (EMG). This is a test that uses small electrodes to check for nerve damage. How is this treated? Treatment for this condition depends on the cause and severity. Treatment  may also focus on addressing any underlying causes of this condition. Treatment may include: Medicines. This may include medicines to: Prevent diarrhea. Help with constipation (bulk-forming laxatives). Treat any underlying conditions. Biofeedback therapy. This can help to retrain muscles that are affected. Fiber supplements. These can help manage your bowel movements. Nerve stimulation. Injectable gel to promote tissue growth and better muscle control. Surgery. You may need: Sphincter repair surgery. Diversion surgery. This procedure lets feces pass out of your body through a hole in your abdomen. Follow these instructions at home: Eating and drinking  Follow instructions from your health care provider about any eating or drinking restrictions. Work with a dietitian to come up with a healthy diet that will help you avoid the foods that can make your condition worse. Keep a diet diary to find out which foods or drinks could be making your condition worse. Drink enough fluid to keep your urine pale yellow.  Lifestyle Do not use any products that contain nicotine or tobacco, such as cigarettes and e-cigarettes. If you need help quitting, ask your health care provider. This may help your condition. If you are overweight, talk with your health care provider about how to safely lose weight. This may help your condition. Increase your physical activity as told by your health care provider. This may help your condition. Always talk with your health care provider before starting a new exercise program. Carry a change of clothes and supplies to clean up quickly if you have an episode of fetal incontinence. Consider joining a fecal incontinence support group. You can find a support group online or in your local community. General instructions  Take over-the-counter and prescription medicines only as told by your health care provider. This includes any supplements. Apply a moisture barrier, such as  petroleum jelly, to your rectum. This protects the skin from irritation caused by ongoing leaking or diarrhea. Tell your health care provider if you are upset or depressed about your condition. Keep all follow-up visits as told by your health care provider. This is important.  Where to find more information International Foundation for Functional Gastrointestinal Disorders: iffgd.[place] of Gastroenterology: patients.gi.org Contact a health care provider if: You have a fever. You have redness, swelling, or pain around your rectum. Your pain is getting worse or you lose feeling in your rectal area. You have blood in your stool. You feel sad or hopeless. You avoid social or work situations. Get help right away if: You stop having bowel movements. You cannot eat or drink without vomiting. You have rectal bleeding that does not stop. You have severe pain that is getting worse. You have symptoms of dehydration, including: Sleepiness or fatigue. Producing little or no urine, tears, or sweat. Dizziness. Dry mouth. Unusual irritability. Headache. Inability to think clearly. Summary Fecal incontinence, also called accidental bowel leakage, is not being able to control your bowels. This condition happens because the nerves or muscles around the anus do not work the way they should. Treatment varies depending on the cause and severity of your condition. Treatment may also focus on addressing any underlying causes of this condition. Follow instructions from your health care provider about any eating or drinking restrictions, lifestyle changes, and skin care. Take over-the-counter  and prescription medicines only as told by your health care provider. This includes any supplements. Tell your health care provider if your symptoms worsen or if you are upset or depressed about your condition. This information is not intended to replace advice given to you by your health care provider.  Make sure you discuss any questions you have with your healthcare provider. Document Revised: 01/22/2018 Document Reviewed: 01/22/2018 Elsevier Patient Education  2022 [employer].

## 2021-05-17 NOTE — Progress Notes (Signed)
GYNECOLOGY  VISIT   HPI: 55 y.o.   Married  Caucasian  female   [phone] with No LMP recorded. Patient is postmenopausal.   here for urinary urgency No leakage with coughing, sneezing or laughing. Also wondering what to expect from menopause.   No caffeine use.  One diet drink a day.   Some weight gain.  Working at home.   Had some blood work showing elevated FSH and low estrogen.  Long time since she had a cycle due to Mirena IUD.  IUD out now for 2 years.   Has issues with temperature regulation at night, hot and cold.  No hot flashes.  No irritability.  Vaginal dryness.   Prefers not to take medication.   FH sister with breast cancer.   4 vaginal deliveries, 9 - 9.5 pounds each.  No operative vaginal delivery.  No extensive laceration.   GYNECOLOGIC HISTORY: No LMP recorded. Patient is postmenopausal. Contraception:  PMP Menopausal hormone therapy:  none Last mammogram:  04-17-21 3D/Neg/BiRads1. Strong family hx of breast cancer--SEE REPORT IN EPIC Last pap smear: 07-13-18 Neg:Neg HR HPV, 04-28-14 Neg:Neg HR HPV, 04-09-11 Neg        OB History     Gravida  4   Para  4   Term  4   Preterm      AB      Living  4      SAB      IAB      Ectopic      Multiple      Live Births                 Patient Active Problem List   Diagnosis Date Noted   Family history of bicuspid aortic valve 11/28/2020   Other hyperlipidemia 11/28/2020   Chest pain of uncertain etiology [id number]   Thoracic aortic aneurysm without rupture ([place]) 10/30/2020   PAC (premature atrial contraction) 10/30/2020   Postoperative bile leak 04/12/2016   Acute hypokalemia 04/12/2016   Paroxysmal SVT (supraventricular tachycardia) ([place]) 04/12/2016   Bile leak, postoperative 04/12/2016   S/P laparoscopic cholecystectomy    History of Gallstone pancreatitis 04/07/2016   Pancreatitis due to biliary obstruction 04/07/2016   Cancer ([place])    Osteopenia     Past Medical History:   Diagnosis Date   Allergic rhinitis    Cancer ([place])    Melanoma   Cervical dysplasia 2007   low-grade SIL, normal Pap smears since then   Family history of adverse reaction to anesthesia    mom has h/o post op nausea/vomiting   Intermittent palpitations    IUD    inserted 03/2002.04/06/2007, 06/03/2012   Ocular migraine    Osteomalacia    Pancreatitis    Seafood allergy     Past Surgical History:  Procedure Laterality Date   BUNIONECTOMY  2018   CHOLECYSTECTOMY N/A 04/09/2016   Procedure: LAPAROSCOPIC CHOLECYSTECTOMY ;  Surgeon: Rolm Bookbinder, MD;  Location: [facility];  Service: General;  Laterality: N/A;   ERCP  04/11/2016   ERCP with pancreatic stent and failed CBD stent after needle knife and conventional sphincterotomy   ERCP N/A 04/12/2016   Procedure: ENDOSCOPIC RETROGRADE CHOLANGIOPANCREATOGRAPHY (ERCP);  Surgeon: Clarene Essex, MD;  Location: [facility] ENDOSCOPY;  Service: Endoscopy;  Laterality: N/A;   FOOT SURGERY     toenail   INTRAUTERINE DEVICE INSERTION  08/07/2017   Mirena   removal of melanoma      Current Outpatient Medications  Medication Sig Dispense  Refill   Adapalene 0.3 % gel Apply topically 2 (two) times a week.     eletriptan (RELPAX) 40 MG tablet as needed.     EPINEPHrine 0.3 mg/0.3 mL IJ SOAJ injection as needed.     famotidine (PEPCID) 20 MG tablet Take 20 mg by mouth daily.     fluticasone (FLONASE) 50 MCG/ACT nasal spray as needed.     metoprolol tartrate (LOPRESSOR) 25 MG tablet Take every 6 hours as needed for heart palpitations 60 tablet 1   No current facility-administered medications for this visit.     ALLERGIES: Iodine, Other, Ampicillin, Avocado, Contrast media [iodinated diagnostic agents], Levofloxacin, Pantoprazole, Septra [bactrim], and Sulfamethoxazole-trimethoprim  Family History  Problem Relation Age of Onset   Breast cancer Sister 47   Arthritis Sister        Psoriatic   Thyroid disease Sister    Stroke Father    Autoimmune disease  Father    CVA Father    Tremor Mother    Breast cancer Maternal Grandmother        Age 8   Thyroid disease Daughter        Hypo    Social History   Socioeconomic History   Marital status: Married    Spouse name: Not on file   Number of children: Not on file   Years of education: Not on file   Highest education level: Not on file  Occupational History   Not on file  Tobacco Use   Smoking status: Former   Smokeless tobacco: Never  Vaping Use   Vaping Use: Never used  Substance and Sexual Activity   Alcohol use: Yes    Alcohol/week: 0.0 standard drinks    Comment: Rare   Drug use: No   Sexual activity: Yes    Birth control/protection: Post-menopausal    Comment:  declined sexual hx questions,des neg  Other Topics Concern   Not on file  Social History Narrative   Not on file   Social Determinants of Health   Financial Resource Strain: Not on file  Food Insecurity: Not on file  Transportation Needs: Not on file  Physical Activity: Not on file  Stress: Not on file  Social Connections: Not on file  Intimate Partner Violence: Not on file    Review of Systems  Genitourinary:  Positive for urgency.  All other systems reviewed and are negative.  PHYSICAL EXAMINATION:    BP 110/60   Pulse 64   Ht 5' 5.5" (1.664 m)   Wt 154 lb (69.9 kg)   SpO2 98%   BMI 25.24 kg/m     General appearance: alert, cooperative and appears stated age  Pelvic: External genitalia:  no lesions              Urethra:  normal appearing urethra with no masses, tenderness or lesions              Bartholins and Skenes: normal                 Vagina: normal appearing vagina with normal color and discharge, no lesions              Cervix: no lesions                Bimanual Exam:  Uterus:  normal size, contour, position, consistency, mobility, non-tender              Adnexa: no mass, fullness, tenderness  Rectal exam: yes.  Confirms.              Anus:  slightly decrease rectal  tone., no lesions  Chaperone was present for exam:  Estill Bamberg, CMA.  ASSESSMENT  Urinary urgency.  Urinary incontinence.  Overactive bladder.  Fecal incontinence.  FH breast cancer.  Sister negative genetic testing.  Menopausal female.  PLAN  We discussed overactive bladder and fecal incontinence.  Risk factors for pelvic floor weakness discussed.  She declines medication for overactive bladder.  Patient was unable to give an adequate urine specimen today for analysis.  Metamucil recommended for fecal incontinence.  Will refer for pelvic floor therapy.  If pelvic floor therapy is not successful, I will refer patient to Dr. Lenise Arena or Dr. Maryland Pink for fecal incontinence.  We discussed hormonal changes with menopause.  Brochure on menopause also given to patient.  Patient doing yearly mammogram and MRI of breast Fu for annual exam and prn.   An After Visit Summary was printed and given to the patient.  38 min  total time was spent for this patient encounter, including preparation, face-to-face counseling with the patient, coordination of care, and documentation of the encounter.

## 2021-05-20 ENCOUNTER — Telehealth: Payer: Self-pay | Admitting: Obstetrics and Gynecology

## 2021-05-20 DIAGNOSIS — R159 Full incontinence of feces: Secondary | ICD-10-CM

## 2021-05-20 DIAGNOSIS — R3915 Urgency of urination: Secondary | ICD-10-CM

## 2021-05-20 DIAGNOSIS — R32 Unspecified urinary incontinence: Secondary | ICD-10-CM

## 2021-05-20 DIAGNOSIS — N3281 Overactive bladder: Secondary | ICD-10-CM

## 2021-05-20 NOTE — Telephone Encounter (Signed)
Please place referral to [facility] for urinary and fecal incontinence.

## 2021-05-22 NOTE — Progress Notes (Signed)
New Patient Note  RE: Laurie Terry MRN: [id number] DOB: Jul 04, 1966 Date of Office Visit: 05/23/2021  Consult requested by: Faustino Congress, NP Primary care provider: [name], MD  Chief Complaint: Allergic Reaction (States she has had reactions to a lot of medications. Recent was two weeks ago to pantoprazole. She experienced fever, joint pain, rash, and  skin peeling. ) and Asthma (Says she had asthma as a child.  Says she has out grown it since her middle school years.)  History of Present Illness: I had the pleasure of seeing Laurie Terry for initial evaluation at the [facility] on 05/23/2021. She is a 55 y.o. female, who is referred here by [name], MD for the evaluation of drug reaction.  Drug reaction: Reaction to pantoprazole which occurred 2 weeks ago. She was being treated for esophageal pain/spasm. Patient took 1 dose with dinner and about 1-2 hours afterwards she noticed some joint pains, had chills and felt warm. She had a rash across her face as well. She thought she had Covid but had multiple negative at home testing. She took another dose of pantoprazole the next day and noticed some skin peeling on the face.  This was the first time she had taken this type of medication. The symptoms resolved after 2 days of stopping the medication. Patient used Benadryl and Motrin with good benefit.  Mucosal involvement: no.  Denies any changes in foods, personal care products or recent infections.  Iodine Patient had CT done with IV dye - she got hypotensive and fainted. Patient was then pre-treated with prednisone and had no issues with the IV dye.  Septra/Levaquin - developed hives. Ampicillin - not sure what happened as a child.   Food: Currently avoiding seafood and patient is a vegetarian. Avocados, banana, melons cause perioral pruritus. She does have access to epinephrine autoinjector and not needed to use it  recently.  Past work up includes: skin testing as a child showed multiple positives but does it some of those with no reactions.   Dietary History: patient has been eating other foods including milk, eggs, peanut, treenuts, sesame, wheat, fruits and vegetables.  Assessment and Plan: Tykeisha is a 55 y.o. female with: Adverse effect of other drugs, medicaments and biological substances, subsequent encounter Developed joint pain, chills, facial rash after taking pantoprazole for the first time.  Symptoms resolved after 2 days with Motrin and Benadryl.  Concerned about taking other new medications.  History of reaction to IV dye but tolerated with prednisone pretreatment.  Septra and Levaquin caused hives.  Unknown reaction to ampicillin. Discussed with patient that the reaction to pantoprazole seems to be non-IgE mediated.  There is no blood work or skin prick testing available for non-IgE mediated adverse drug reactions. There is also no blood work or skin prick testing to predict any future reactions to medications.  Continue to avoid drugs on allergist list. Consider penicillin allergy skin testing and in office drug challenge in the future.  Over 90% of people with history of penicillin allergy which occurred over 10 years ago are found to be non-allergic.   Oral allergy syndrome, subsequent encounter Avocados, bananas and melon cause perioral pruritus.  Patient is a vegetarian.  Apparently she had multiple food positives as a child but she has been eating some of them with no reactions. Today's skin testing showed: Borderline positive to cantaloupe, watermelon.  No indication for skin testing for foods that she is consuming without any issues.  Continue to avoid foods that are bothersome.  Discussed that her food triggered oral and throat symptoms are likely caused by oral food allergy syndrome (OFAS). This is caused by cross reactivity of pollen with fresh fruits and vegetables, and nuts.  Symptoms are usually localized in the form of itching and burning in mouth and throat. Very rarely it can progress to more severe symptoms. Eating foods in cooked or processed forms usually minimizes symptoms. I recommended avoidance of eating the problem foods, especially during the peak season(s). Sometimes, OFAS can induce severe throat swelling or even a systemic reaction; with such instance, I advised them to report to a local ER. A list of common pollens and food cross-reactivities was provided to the patient.   Other allergic rhinitis Rhinoconjunctivitis symptoms mainly in the spring and fall.  Used to be on allergy injections as a child with good benefit.  Using Flonase and Benadryl as needed currently. Today's skin testing showed: Positive to grass, ragweed, weed, trees, mold, dust mites, cat, cockroach.  Start environmental control measures as below. Use over the counter antihistamines such as Xyzal (levocetirizine) daily as needed. May take twice a day during allergy flares. Use Flonase (fluticasone) nasal spray 1 spray per nostril twice a day as needed for nasal congestion.  Nasal saline spray (i.e., Simply Saline) or nasal saline lavage (i.e., NeilMed) is recommended as needed and prior to medicated nasal sprays. Consider allergy injections for long term control if above medications do not help the symptoms - handout given.  This may also help her oral allergy syndrome.  Allergic conjunctivitis of both eyes See assessment and plan as above.  History of asthma Asthma as a child with frequent hospitalizations in the past.  No recent symptoms and no recent albuterol use. Today's spirometry was normal. May use albuterol rescue inhaler 2 puffs every 4 to 6 hours as needed for shortness of breath, chest tightness, coughing, and wheezing. Monitor frequency of use.   Return for Drug challenge.  Meds ordered this encounter  Medications  . levocetirizine (XYZAL) 5 MG tablet    Sig: Take 1  tablet (5 mg total) by mouth every evening.    Dispense:  30 tablet    Refill:  5  . albuterol (PROAIR HFA) 108 (90 Base) MCG/ACT inhaler    Sig: Inhale 2 puffs into the lungs every 4 (four) hours as needed for wheezing or shortness of breath (coughing fits).    Dispense:  8 g    Refill:  1    Lab Orders  No laboratory test(s) ordered today    Other allergy screening: Asthma: yes Patient had asthma as a child and was hospitalized multiple times as a child. Denies any symptoms.   Rhino conjunctivitis: yes Patient was on allergy injections as a child with good benefit. Still having perennial sneezing, itchy eyes but worse in the spring and fall.  Using Flonase, Benadryl with good benefit.   Hymenoptera allergy: no Urticaria: no Eczema: yes History of recurrent infections suggestive of immunodeficency: no  Diagnostics: Spirometry:  Tracings reviewed. Her effort: Good reproducible efforts. FVC: 3.56L FEV1: 2.97L, 108% predicted FEV1/FVC ratio: 83% Interpretation: Spirometry consistent with normal pattern.  Please see scanned spirometry results for details.  Skin Testing: Environmental allergy panel and select foods. Positive to grass, ragweed, weed, trees, mold, dust mites, cat, cockroach.  Borderline positive to cantaloupe, watermelon.  Results discussed with patient/family.  Airborne Adult Perc - 05/23/21 0918     Time Antigen Placed [phone]  Allergen Manufacturer Greer    Location Back    Number of Test 59    1. Control-Buffer 50% Glycerol Negative    2. Control-Histamine 1 mg/ml 2+    3. Albumin saline Negative    4. [place] 2+    5. [place] 2+    6. [place] 2+    7. [place] Blue 2+    8. Meadow Fescue 2+    9. Perennial Rye 2+    10. Sweet Vernal 2+    11. Timothy 2+    12. Cocklebur Negative    13. Burweed Marshelder Negative    14. Ragweed, short 2+    15. Ragweed, Giant 2+    16. Plantain,  English Negative    17. Lamb's Quarters --   +/-   18. Sheep  Sorrell 2+    19. Rough Pigweed Negative    20. Marsh Elder, Rough Negative    21. Mugwort, Common 2+    22. Ash mix Negative    23. Birch mix Negative    24. Beech American Negative    25. Box, Elder Negative    26. Cedar, red Negative    27. Cottonwood, [place] Negative    28. Elm mix Negative    29. Hickory 4+    30. Maple mix 2+    31. Oak, [place] mix 4+    32. Pecan Pollen 4+    33. Pine mix Negative    34. Sycamore Eastern Negative    35. [place], Black Pollen 2+    36. Alternaria alternata 2+    37. Cladosporium Herbarum 2+    38. Aspergillus mix Negative    39. Penicillium mix Negative    40. Bipolaris sorokiniana (Helminthosporium) 2+    41. Drechslera spicifera (Curvularia) 2+    42. Mucor plumbeus 2+    43. Fusarium moniliforme Negative    44. Aureobasidium pullulans (pullulara) 2+    45. Rhizopus oryzae 2+    46. Botrytis cinera Negative    47. Epicoccum nigrum 2+    48. Phoma betae 2+    49. Candida Albicans 2+    50. Trichophyton mentagrophytes 2+    51. Mite, D Farinae  5,000 AU/ml 2+    52. Mite, D Pteronyssinus  5,000 AU/ml 2+    53. Cat Hair 10,000 BAU/ml 2+    54.  Dog Epithelia Negative    55. Mixed Feathers Negative    56. Horse Epithelia Negative    57. Cockroach, German Negative    58. Mouse Negative    59. Tobacco Leaf Negative    Comments n             Intradermal - 05/23/21 1000     Time Antigen Placed 1033    Allergen Manufacturer Lavella Hammock    Location Arm    Number of Test 4    Control Negative    Mold 2 3+    Dog Negative    Cockroach 2+             Food Adult Perc - 05/23/21 0900     Time Antigen Placed [id number]    Allergen Manufacturer Lavella Hammock    Location Back    Number of allergen test 5    Control-Histamine 1 mg/ml 2+    48. Avocado Negative    57. Banana Negative    61. Cantaloupe --   +/-   62. Watermelon --   +/-  Past Medical History: Patient Active Problem List   Diagnosis Date Noted  . Adverse  effect of other drugs, medicaments and biological substances, subsequent encounter 05/23/2021  . Other allergic rhinitis 05/23/2021  . Allergic conjunctivitis of both eyes 05/23/2021  . History of asthma 05/23/2021  . Oral allergy syndrome, subsequent encounter 05/23/2021  . Family history of bicuspid aortic valve 11/28/2020  . Other hyperlipidemia 11/28/2020  . Chest pain of uncertain etiology [id number]  . Thoracic aortic aneurysm without rupture ([place]) 10/30/2020  . PAC (premature atrial contraction) 10/30/2020  . Postoperative bile leak 04/12/2016  . Acute hypokalemia 04/12/2016  . Paroxysmal SVT (supraventricular tachycardia) ([place]) 04/12/2016  . Bile leak, postoperative 04/12/2016  . S/P laparoscopic cholecystectomy   . History of Gallstone pancreatitis 04/07/2016  . Pancreatitis due to biliary obstruction 04/07/2016  . Cancer ([place])   . Osteopenia    Past Medical History:  Diagnosis Date  . Allergic rhinitis   . Cancer ([place])    Melanoma  . Cervical dysplasia 2007   low-grade SIL, normal Pap smears since then  . Family history of adverse reaction to anesthesia    mom has h/o post op nausea/vomiting  . Intermittent palpitations   . IUD    inserted 03/2002.04/06/2007, 06/03/2012  . Ocular migraine   . Osteomalacia   . Pancreatitis   . Seafood allergy    Past Surgical History: Past Surgical History:  Procedure Laterality Date  . BUNIONECTOMY  2018  . CHOLECYSTECTOMY N/A 04/09/2016   Procedure: LAPAROSCOPIC CHOLECYSTECTOMY ;  Surgeon: Rolm Bookbinder, MD;  Location: [facility];  Service: General;  Laterality: N/A;  . ERCP  04/11/2016   ERCP with pancreatic stent and failed CBD stent after needle knife and conventional sphincterotomy  . ERCP N/A 04/12/2016   Procedure: ENDOSCOPIC RETROGRADE CHOLANGIOPANCREATOGRAPHY (ERCP);  Surgeon: Clarene Essex, MD;  Location: [facility] ENDOSCOPY;  Service: Endoscopy;  Laterality: N/A;  . FOOT SURGERY     toenail  . INTRAUTERINE DEVICE INSERTION   08/07/2017   Mirena  . removal of melanoma     Medication List:  Current Outpatient Medications  Medication Sig Dispense Refill  . Adapalene 0.3 % gel Apply topically 2 (two) times a week.    Marland Kitchen albuterol (PROAIR HFA) 108 (90 Base) MCG/ACT inhaler Inhale 2 puffs into the lungs every 4 (four) hours as needed for wheezing or shortness of breath (coughing fits). 8 g 1  . EPINEPHrine 0.3 mg/0.3 mL IJ SOAJ injection as needed.    . famotidine (PEPCID) 20 MG tablet Take 20 mg by mouth daily.    . fluticasone (FLONASE) 50 MCG/ACT nasal spray as needed.    Marland Kitchen levocetirizine (XYZAL) 5 MG tablet Take 1 tablet (5 mg total) by mouth every evening. 30 tablet 5  . Probiotic Product (PROBIOTIC-10 PO) Take 1 tablet by mouth daily.    Marland Kitchen eletriptan (RELPAX) 40 MG tablet as needed. (Patient not taking: Reported on 05/23/2021)    . metoprolol tartrate (LOPRESSOR) 25 MG tablet Take every 6 hours as needed for heart palpitations (Patient not taking: Reported on 05/23/2021) 60 tablet 1   No current facility-administered medications for this visit.   Allergies: Allergies  Allergen Reactions  . Iodine Shortness Of Breath and Swelling  . Other Hives and Swelling    Seafood  . Ampicillin Other (See Comments)    unknown  . Avocado Other (See Comments)  . Contrast Media [Iodinated Diagnostic Agents]     Shortness of breath  . Levofloxacin Hives  . Pantoprazole  Other (See Comments)    Joint aches, fever, peeling of skin  . Septra [Bactrim] Hives  . Sulfamethoxazole-Trimethoprim Other (See Comments) and Hives   Social History: Social History   Socioeconomic History  . Marital status: Married    Spouse name: Not on file  . Number of children: Not on file  . Years of education: Not on file  . Highest education level: Not on file  Occupational History  . Not on file  Tobacco Use  . Smoking status: Former  . Smokeless tobacco: Never  Vaping Use  . Vaping Use: Never used  Substance and Sexual Activity   . Alcohol use: Yes    Alcohol/week: 0.0 standard drinks    Comment: Rare  . Drug use: No  . Sexual activity: Yes    Birth control/protection: Post-menopausal    Comment:  declined sexual hx questions,des neg  Other Topics Concern  . Not on file  Social History Narrative  . Not on file   Social Determinants of Health   Financial Resource Strain: Not on file  Food Insecurity: Not on file  Transportation Needs: Not on file  Physical Activity: Not on file  Stress: Not on file  Social Connections: Not on file   Lives in a 56 year old house. Smoking: denies Occupation: HR  Environmental History: Water Damage/mildew in the house: no Carpet in the family room: no Carpet in the bedroom: no Heating: gas Cooling: central Pet: yes 2 cats   Family History: Family History  Problem Relation Age of Onset  . Breast cancer Sister 95  . Arthritis Sister        Psoriatic  . Thyroid disease Sister   . Stroke Father   . Autoimmune disease Father   . CVA Father   . Tremor Mother   . Breast cancer Maternal Grandmother        Age 14  . Thyroid disease Daughter        Hypo   Problem                               Relation Asthma                                   Grandmother  Eczema                                Mother   Food allergy                          No  Allergic rhino conjunctivitis     No   Review of Systems  Constitutional:  Negative for appetite change, chills, fever and unexpected weight change.  HENT:  Positive for sneezing. Negative for congestion and rhinorrhea.   Eyes:  Positive for itching.  Respiratory:  Negative for cough, chest tightness, shortness of breath and wheezing.   Cardiovascular:  Negative for chest pain.  Gastrointestinal:  Negative for abdominal pain.  Genitourinary:  Negative for difficulty urinating.  Skin:  Negative for rash.  Allergic/Immunologic: Positive for environmental allergies.   Objective: BP 98/64   Pulse 74   Resp 14   Ht 5'  5" (1.651 m)   Wt 154 lb 2 oz (69.9 kg)   SpO2 98%  BMI 25.65 kg/m  Body mass index is 25.65 kg/m. Physical Exam Vitals and nursing note reviewed.  Constitutional:      Appearance: Normal appearance. She is well-developed.  HENT:     Head: Normocephalic and atraumatic.     Right Ear: Tympanic membrane and external ear normal.     Left Ear: Tympanic membrane and external ear normal.     Nose: Nose normal.     Mouth/Throat:     Mouth: Mucous membranes are moist.     Pharynx: Oropharynx is clear.  Eyes:     Conjunctiva/sclera: Conjunctivae normal.  Cardiovascular:     Rate and Rhythm: Normal rate and regular rhythm.     Heart sounds: Normal heart sounds. No murmur heard.   No friction rub. No gallop.  Pulmonary:     Effort: Pulmonary effort is normal.     Breath sounds: Normal breath sounds. No wheezing, rhonchi or rales.  Musculoskeletal:     Cervical back: Neck supple.  Skin:    General: Skin is warm.     Findings: No rash.  Neurological:     Mental Status: She is alert and oriented to person, place, and time.  Psychiatric:        Behavior: Behavior normal.   The plan was reviewed with the patient/family, and all questions/concerned were addressed.  It was my pleasure to see Debanhi today and participate in her care. Please feel free to contact me with any questions or concerns.  Sincerely,  Rexene Alberts, DO Allergy & Immunology  [facility] office: [place] office: [phone]

## 2021-05-22 NOTE — Telephone Encounter (Signed)
Referral placed at [facility] outpatient physical therapy they will call to schedule.

## 2021-05-23 ENCOUNTER — Other Ambulatory Visit: Payer: Self-pay

## 2021-05-23 ENCOUNTER — Ambulatory Visit: Payer: 59 | Admitting: Allergy

## 2021-05-23 ENCOUNTER — Encounter: Payer: Self-pay | Admitting: Allergy

## 2021-05-23 VITALS — BP 98/64 | HR 74 | Resp 14 | Ht 65.0 in | Wt 154.1 lb

## 2021-05-23 DIAGNOSIS — T781XXD Other adverse food reactions, not elsewhere classified, subsequent encounter: Secondary | ICD-10-CM

## 2021-05-23 DIAGNOSIS — T50995D Adverse effect of other drugs, medicaments and biological substances, subsequent encounter: Secondary | ICD-10-CM

## 2021-05-23 DIAGNOSIS — J3089 Other allergic rhinitis: Secondary | ICD-10-CM

## 2021-05-23 DIAGNOSIS — Z8709 Personal history of other diseases of the respiratory system: Secondary | ICD-10-CM | POA: Insufficient documentation

## 2021-05-23 DIAGNOSIS — H1013 Acute atopic conjunctivitis, bilateral: Secondary | ICD-10-CM | POA: Diagnosis not present

## 2021-05-23 MED ORDER — ALBUTEROL SULFATE HFA 108 (90 BASE) MCG/ACT IN AERS: 2.0000 | INHALATION_SPRAY | RESPIRATORY_TRACT | 1 refills | Status: DC | PRN

## 2021-05-23 MED ORDER — LEVOCETIRIZINE DIHYDROCHLORIDE 5 MG PO TABS: 5.0000 mg | ORAL_TABLET | Freq: Every evening | ORAL | 5 refills | Status: DC

## 2021-05-23 NOTE — Assessment & Plan Note (Signed)
Rhinoconjunctivitis symptoms mainly in the spring and fall.  Used to be on allergy injections as a child with good benefit.  Using Flonase and Benadryl as needed currently.  Today's skin testing showed: Positive to grass, ragweed, weed, trees, mold, dust mites, cat, cockroach.   Start environmental control measures as below.  Use over the counter antihistamines such as Xyzal (levocetirizine) daily as needed. May take twice a day during allergy flares. . Use Flonase (fluticasone) nasal spray 1 spray per nostril twice a day as needed for nasal congestion.  . Nasal saline spray (i.e., Simply Saline) or nasal saline lavage (i.e., NeilMed) is recommended as needed and prior to medicated nasal sprays.  Consider allergy injections for long term control if above medications do not help the symptoms - handout given.   This may also help her oral allergy syndrome.

## 2021-05-23 NOTE — Assessment & Plan Note (Signed)
Developed joint pain, chills, facial rash after taking pantoprazole for the first time.  Symptoms resolved after 2 days with Motrin and Benadryl.  Concerned about taking other new medications.  History of reaction to IV dye but tolerated with prednisone pretreatment.  Septra and Levaquin caused hives.  Unknown reaction to ampicillin. . Discussed with patient that the reaction to pantoprazole seems to be non-IgE mediated.  There is no blood work or skin prick testing available for non-IgE mediated adverse drug reactions. There is also no blood work or skin prick testing to predict any future reactions to medications.  . Continue to avoid drugs on allergist list.  Consider penicillin allergy skin testing and in office drug challenge in the future.   Over 90% of people with history of penicillin allergy which occurred over 10 years ago are found to be non-allergic.

## 2021-05-23 NOTE — Patient Instructions (Addendum)
Today's skin testing showed: Positive to grass, ragweed, weed, trees, mold, dust mites, cat, cockroach.  Borderline positive to cantaloupe, watermelon.   Drug: Continue to avoid drugs on your allergist list. There's no test to predict what drugs you are going to have a reaction to. Schedule for penicillin skin testing firs.   Penicillin allergy: Consider penicillin allergy skin testing and in office drug challenge in the future.  Over 90% of people with history of penicillin allergy which occurred over 10 years ago are found to be non-allergic.  You must be off antihistamines for 3-5 days before. Plan on being in the office for 2-3 hours. You must call to schedule an appointment and specify it's for a drug challenge.  A few days prior to the appointment, I will send in a prescription for amoxicillin liquid which you must bring to the appointment as well.   Environmental allergies Start environmental control measures as below. Use over the counter antihistamines such as Xyzal (levocetirizine) daily as needed. May take twice a day during allergy flares.  Use Flonase (fluticasone) nasal spray 1 spray per nostril twice a day as needed for nasal congestion.  Nasal saline spray (i.e., Simply Saline) or nasal saline lavage (i.e., NeilMed) is recommended as needed and prior to medicated nasal sprays. Consider allergy injections for long term control if above medications do not help the symptoms - handout given.   Food: Continue to avoid foods that are bothersome.  Discussed that her food triggered oral and throat symptoms are likely caused by oral food allergy syndrome (OFAS). This is caused by cross reactivity of pollen with fresh fruits and vegetables, and nuts. Symptoms are usually localized in the form of itching and burning in mouth and throat. Very rarely it can progress to more severe symptoms. Eating foods in cooked or processed forms usually minimizes symptoms. I recommended avoidance of  eating the problem foods, especially during the peak season(s). Sometimes, OFAS can induce severe throat swelling or even a systemic reaction; with such instance, I advised them to report to a local ER. A list of common pollens and food cross-reactivities was provided to the patient.   Asthma: Normal breathing test today. May use albuterol rescue inhaler 2 puffs every 4 to 6 hours as needed for shortness of breath, chest tightness, coughing, and wheezing. Monitor frequency of use.   Follow up for penicillin skin testing.   Reducing Pollen Exposure Pollen seasons: trees (spring), grass (summer) and ragweed/weeds (fall). Keep windows closed in your home and car to lower pollen exposure.  Install air conditioning in the bedroom and throughout the house if possible.  Avoid going out in dry windy days - especially early morning. Pollen counts are highest between 5 - 10 AM and on dry, hot and windy days.  Save outside activities for late afternoon or after a heavy rain, when pollen levels are lower.  Avoid mowing of grass if you have grass pollen allergy. Be aware that pollen can also be transported indoors on people and pets.  Dry your clothes in an automatic dryer rather than hanging them outside where they might collect pollen.  Rinse hair and eyes before bedtime. Mold Control Mold and fungi can grow on a variety of surfaces provided certain temperature and moisture conditions exist.  Outdoor molds grow on plants, decaying vegetation and soil. The major outdoor mold, Alternaria and Cladosporium, are found in very high numbers during hot and dry conditions. Generally, a late summer - fall peak is seen for  common outdoor fungal spores. Rain will temporarily lower outdoor mold spore count, but counts rise rapidly when the rainy period ends. The most important indoor molds are Aspergillus and Penicillium. Dark, humid and poorly ventilated basements are ideal sites for mold growth. The next most common  sites of mold growth are the bathroom and the kitchen. Outdoor (Seasonal) Mold Control Use air conditioning and keep windows closed. Avoid exposure to decaying vegetation. Avoid leaf raking. Avoid grain handling. Consider wearing a face mask if working in moldy areas.  Indoor (Perennial) Mold Control  Maintain humidity below 50%. Get rid of mold growth on hard surfaces with water, detergent and, if necessary, 5% bleach (do not mix with other cleaners). Then dry the area completely. If mold covers an area more than 10 square feet, consider hiring an indoor environmental professional. For clothing, washing with soap and water is best. If moldy items cannot be cleaned and dried, throw them away. Remove sources e.g. contaminated carpets. Repair and seal leaking roofs or pipes. Using dehumidifiers in damp basements may be helpful, but empty the water and clean units regularly to prevent mildew from forming. All rooms, especially basements, bathrooms and kitchens, require ventilation and cleaning to deter mold and mildew growth. Avoid carpeting on concrete or damp floors, and storing items in damp areas. Control of House Dust Mite Allergen Dust mite allergens are a common trigger of allergy and asthma symptoms. While they can be found throughout the house, these microscopic creatures thrive in warm, humid environments such as bedding, upholstered furniture and carpeting. Because so much time is spent in the bedroom, it is essential to reduce mite levels there.  Encase pillows, mattresses, and box springs in special allergen-proof fabric covers or airtight, zippered plastic covers.  Bedding should be washed weekly in hot water (130 F) and dried in a hot dryer. Allergen-proof covers are available for comforters and pillows that can't be regularly washed.  Wash the allergy-proof covers every few months. Minimize clutter in the bedroom. Keep pets out of the bedroom.  Keep humidity less than 50% by using a  dehumidifier or air conditioning. You can buy a humidity measuring device called a hygrometer to monitor this.  If possible, replace carpets with hardwood, linoleum, or washable area rugs. If that's not possible, vacuum frequently with a vacuum that has a HEPA filter. Remove all upholstered furniture and non-washable window drapes from the bedroom. Remove all non-washable stuffed toys from the bedroom.  Wash stuffed toys weekly. Pet Allergen Avoidance: Contrary to popular opinion, there are no "hypoallergenic" breeds of dogs or cats. That is because people are not allergic to an animal's hair, but to an allergen found in the animal's saliva, dander (dead skin flakes) or urine. Pet allergy symptoms typically occur within minutes. For some people, symptoms can build up and become most severe 8 to 12 hours after contact with the animal. People with severe allergies can experience reactions in public places if dander has been transported on the pet owners' clothing. Keeping an animal outdoors is only a partial solution, since homes with pets in the yard still have higher concentrations of animal allergens. Before getting a pet, ask your allergist to determine if you are allergic to animals. If your pet is already considered part of your family, try to minimize contact and keep the pet out of the bedroom and other rooms where you spend a great deal of time. As with dust mites, vacuum carpets often or replace carpet with a hardwood floor,  tile or linoleum. High-efficiency particulate air (HEPA) cleaners can reduce allergen levels over time. While dander and saliva are the source of cat and dog allergens, urine is the source of allergens from rabbits, hamsters, mice and [place] pigs; so ask a non-allergic family member to clean the animal's cage. If you have a pet allergy, talk to your allergist about the potential for allergy immunotherapy (allergy shots). This strategy can often provide long-term  relief. Cockroach Allergen Avoidance Cockroaches are often found in the homes of densely populated urban areas, schools or commercial buildings, but these creatures can lurk almost anywhere. This does not mean that you have a dirty house or living area. Block all areas where roaches can enter the home. This includes crevices, wall cracks and windows.  Cockroaches need water to survive, so fix and seal all leaky faucets and pipes. Have an exterminator go through the house when your family and pets are gone to eliminate any remaining roaches. Keep food in lidded containers and put pet food dishes away after your pets are done eating. Vacuum and sweep the floor after meals, and take out garbage and recyclables. Use lidded garbage containers in the kitchen. Wash dishes immediately after use and clean under stoves, refrigerators or toasters where crumbs can accumulate. Wipe off the stove and other kitchen surfaces and cupboards regularly.

## 2021-05-23 NOTE — Assessment & Plan Note (Signed)
Asthma as a child with frequent hospitalizations in the past.  No recent symptoms and no recent albuterol use.  Today's spirometry was normal. . May use albuterol rescue inhaler 2 puffs every 4 to 6 hours as needed for shortness of breath, chest tightness, coughing, and wheezing. Monitor frequency of use.

## 2021-05-23 NOTE — Assessment & Plan Note (Signed)
Avocados, bananas and melon cause perioral pruritus.  Patient is a vegetarian.  Apparently she had multiple food positives as a child but she has been eating some of them with no reactions.  Today's skin testing showed: Borderline positive to cantaloupe, watermelon.   No indication for skin testing for foods that she is consuming without any issues. . Continue to avoid foods that are bothersome.  . Discussed that her food triggered oral and throat symptoms are likely caused by oral food allergy syndrome (OFAS). This is caused by cross reactivity of pollen with fresh fruits and vegetables, and nuts. Symptoms are usually localized in the form of itching and burning in mouth and throat. Very rarely it can progress to more severe symptoms. Eating foods in cooked or processed forms usually minimizes symptoms. I recommended avoidance of eating the problem foods, especially during the peak season(s). Sometimes, OFAS can induce severe throat swelling or even a systemic reaction; with such instance, I advised them to report to a local ER. A list of common pollens and food cross-reactivities was provided to the patient.

## 2021-05-23 NOTE — Assessment & Plan Note (Signed)
.   See assessment and plan as above. 

## 2021-05-24 ENCOUNTER — Other Ambulatory Visit (HOSPITAL_COMMUNITY): Payer: 59

## 2021-05-24 ENCOUNTER — Ambulatory Visit (HOSPITAL_COMMUNITY): Payer: 59 | Attending: Cardiology

## 2021-05-24 DIAGNOSIS — I712 Thoracic aortic aneurysm, without rupture, unspecified: Secondary | ICD-10-CM

## 2021-05-24 LAB — ECHOCARDIOGRAM COMPLETE
Area-P 1/2: 3.37 cm2
S' Lateral: 2.7 cm

## 2021-05-29 NOTE — Progress Notes (Signed)
55 y.o. [id number] Married Caucasian female here for annual exam.    Has referral for pelvic floor therapy for urinary and fecal incontinence.  She has not received a phone call for scheduling.   Is taking some vitamin D due to a low level. Seeing a nutritionist.   Going to [place] to see daughter.  Will have the new Covid booster today.  PCP: [name], MD  No LMP recorded. Patient is postmenopausal.           Sexually active: Yes.    The current method of family planning is post menopausal status.    Exercising: Yes.     Exercise classes 3x/week Smoker:  no  Health Maintenance: Pap: 07-13-18 Neg:Neg HR HPV, 04-28-14 Neg:Neg HR HPV, 04-09-11 Neg History of abnormal Pap:  no MMG:  04-17-21 3D/Neg/Birads1 Colonoscopy:  age 55 normal;next 66 years BMD:   years ago  Result :?Normal TDaP:  11-24-15 Gardasil:   no HIV: Neg in preg? Hep C: Unsure Screening Labs: PCP.   reports that she has quit smoking. She has never used smokeless tobacco. She reports that she does not currently use alcohol. She reports that she does not use drugs.  Past Medical History:  Diagnosis Date   Allergic rhinitis    Aortic aneurysm ([place]) 09/23/2020   Cancer ([place])    Melanoma   Cervical dysplasia 2007   low-grade SIL, normal Pap smears since then   Family history of adverse reaction to anesthesia    mom has h/o post op nausea/vomiting   Intermittent palpitations    IUD    inserted 03/2002.04/06/2007, 06/03/2012   Ocular migraine    Osteomalacia    Pancreatitis    Seafood allergy     Past Surgical History:  Procedure Laterality Date   BUNIONECTOMY  2018   CHOLECYSTECTOMY N/A 04/09/2016   Procedure: LAPAROSCOPIC CHOLECYSTECTOMY ;  Surgeon: Rolm Bookbinder, MD;  Location: [facility];  Service: General;  Laterality: N/A;   ERCP  04/11/2016   ERCP with pancreatic stent and failed CBD stent after needle knife and conventional sphincterotomy   ERCP N/A 04/12/2016   Procedure: ENDOSCOPIC RETROGRADE  CHOLANGIOPANCREATOGRAPHY (ERCP);  Surgeon: Clarene Essex, MD;  Location: [facility] ENDOSCOPY;  Service: Endoscopy;  Laterality: N/A;   FOOT SURGERY     toenail   INTRAUTERINE DEVICE INSERTION  08/07/2017   Mirena   removal of melanoma      Current Outpatient Medications  Medication Sig Dispense Refill   Adapalene 0.3 % gel Apply topically 2 (two) times a week.     albuterol (PROAIR HFA) 108 (90 Base) MCG/ACT inhaler Inhale 2 puffs into the lungs every 4 (four) hours as needed for wheezing or shortness of breath (coughing fits). 8 g 1   eletriptan (RELPAX) 40 MG tablet as needed.     EPINEPHrine 0.3 mg/0.3 mL IJ SOAJ injection as needed.     famotidine (PEPCID) 20 MG tablet Take 20 mg by mouth daily.     fluticasone (FLONASE) 50 MCG/ACT nasal spray as needed.     levocetirizine (XYZAL) 5 MG tablet Take 1 tablet (5 mg total) by mouth every evening. 30 tablet 5   metoprolol tartrate (LOPRESSOR) 25 MG tablet Take every 6 hours as needed for heart palpitations 60 tablet 1   Probiotic Product (PROBIOTIC-10 PO) Take 1 tablet by mouth daily.     No current facility-administered medications for this visit.    Family History  Problem Relation Age of Onset   Tremor Mother  Stroke Father    Autoimmune disease Father    CVA Father    Breast cancer Sister 64   Arthritis Sister        Psoriatic   Thyroid disease Sister    Breast cancer Maternal Grandmother        Age 77   Thyroid disease Daughter        Hypo    Review of Systems  All other systems reviewed and are negative.  Exam:   BP 108/60   Pulse 95   Ht '5\' 4"'$  (1.626 m)   Wt 150 lb (68 kg)   SpO2 97%   BMI 25.75 kg/m     General appearance: alert, cooperative and appears stated age Head: normocephalic, without obvious abnormality, atraumatic Neck: no adenopathy, supple, symmetrical, trachea midline and thyroid normal to inspection and palpation Lungs: clear to auscultation bilaterally Breasts: normal appearance, no masses or  tenderness, No nipple retraction or dimpling, No nipple discharge or bleeding, No axillary adenopathy Heart: regular rate and rhythm Abdomen: soft, non-tender; no masses, no organomegaly Extremities: extremities normal, atraumatic, no cyanosis or edema Skin: skin color, texture, turgor normal. No rashes or lesions Lymph nodes: cervical, supraclavicular, and axillary nodes normal. Neurologic: grossly normal  Pelvic: External genitalia:  no lesions              No abnormal inguinal nodes palpated.              Urethra:  normal appearing urethra with no masses, tenderness or lesions              Bartholins and Skenes: normal                 Vagina: normal appearing vagina with normal color and discharge, no lesions              Cervix: no lesions              Pap taken: no Bimanual Exam:  Uterus:  normal size, contour, position, consistency, mobility, non-tender              Adnexa: no mass, fullness, tenderness              Rectal exam: yes.  Confirms.              Anus:  normal sphincter tone, no lesions  Chaperone was present for exam:  Estill Bamberg, CMA  Assessment:   Well woman visit with gynecologic exam. Urinary incontinence.  Overactive bladder.  Fecal incontinence.  FH breast cancer.  Sister negative genetic testing.   Patient doing yearly mammogram and MRI of breast.  Menopausal female.  Plan: Mammogram screening discussed. Self breast awareness reviewed. Will schedule breast MRI with contrast for December, 2022.  Pap and HR HPV as above. Guidelines for Calcium, Vitamin D, regular exercise program including cardiovascular and weight bearing exercise. Will have office check on the status of pelvic floor PT referral.  Labs with PCP.  Follow up annually and prn.    After visit summary provided.

## 2021-05-30 ENCOUNTER — Encounter: Payer: Self-pay | Admitting: Obstetrics and Gynecology

## 2021-05-30 ENCOUNTER — Other Ambulatory Visit: Payer: Self-pay

## 2021-05-30 ENCOUNTER — Telehealth: Payer: Self-pay | Admitting: Obstetrics and Gynecology

## 2021-05-30 ENCOUNTER — Ambulatory Visit (INDEPENDENT_AMBULATORY_CARE_PROVIDER_SITE_OTHER): Payer: 59 | Admitting: Obstetrics and Gynecology

## 2021-05-30 VITALS — BP 108/60 | HR 95 | Ht 64.0 in | Wt 150.0 lb

## 2021-05-30 DIAGNOSIS — Z9189 Other specified personal risk factors, not elsewhere classified: Secondary | ICD-10-CM

## 2021-05-30 DIAGNOSIS — Z01419 Encounter for gynecological examination (general) (routine) without abnormal findings: Secondary | ICD-10-CM

## 2021-05-30 DIAGNOSIS — Z803 Family history of malignant neoplasm of breast: Secondary | ICD-10-CM

## 2021-05-30 NOTE — Telephone Encounter (Signed)
Please assist in schedule a breast MRI with contrast for patient for December, 2022.   She has increased risk of breast cancer.   Please see other triage phone call note for today regarding pelvic floor therapy.

## 2021-05-30 NOTE — Telephone Encounter (Signed)
I called [facility] at [facility] they see the order, will reach to patient today to schedule.

## 2021-05-30 NOTE — Patient Instructions (Signed)

## 2021-05-30 NOTE — Telephone Encounter (Signed)
Dr.Silva sent another telephone encounter regarding this. This encounter will be closed.

## 2021-05-30 NOTE — Telephone Encounter (Signed)
Patient in office today for annual exam.  She states she has not received a phone call to set up her physical therapy.  Please check on the status of this.

## 2021-05-31 NOTE — Telephone Encounter (Signed)
Patient scheduled on 06/01/21

## 2021-06-01 ENCOUNTER — Other Ambulatory Visit: Payer: Self-pay

## 2021-06-01 ENCOUNTER — Ambulatory Visit: Payer: 59 | Attending: Obstetrics and Gynecology | Admitting: Physical Therapy

## 2021-06-01 ENCOUNTER — Encounter: Payer: Self-pay | Admitting: Physical Therapy

## 2021-06-01 DIAGNOSIS — M6281 Muscle weakness (generalized): Secondary | ICD-10-CM | POA: Insufficient documentation

## 2021-06-01 DIAGNOSIS — R278 Other lack of coordination: Secondary | ICD-10-CM | POA: Insufficient documentation

## 2021-06-01 NOTE — Therapy (Signed)
[facility] [address] [place], [place], [address] Phone: [phone]   Fax:  [phone]  Physical Therapy Evaluation  Patient Details  Name: Laurie Terry MRN: [id number] Date of Birth: 1966-01-25 Referring Provider (PT): Dr. Aundria Rud   Encounter Date: 06/01/2021   PT End of Session - 06/01/21 1208     Visit Number 1    Date for PT Re-Evaluation 10/01/21    Authorization Type UHC    Authorization - Visit Number 1    Authorization - Number of Visits 60    PT Start Time 1100    PT Stop Time 1200    PT Time Calculation (min) 60 min    Activity Tolerance Patient tolerated treatment well    Behavior During Therapy [facility] for tasks assessed/performed             Past Medical History:  Diagnosis Date   Allergic rhinitis    Aortic aneurysm ([place]) 09/23/2020   Cancer ([place])    Melanoma   Cervical dysplasia 2007   low-grade SIL, normal Pap smears since then   Family history of adverse reaction to anesthesia    mom has h/o post op nausea/vomiting   Intermittent palpitations    IUD    inserted 03/2002.04/06/2007, 06/03/2012   Ocular migraine    Osteomalacia    Pancreatitis    Seafood allergy     Past Surgical History:  Procedure Laterality Date   BUNIONECTOMY  2018   CHOLECYSTECTOMY N/A 04/09/2016   Procedure: LAPAROSCOPIC CHOLECYSTECTOMY ;  Surgeon: Rolm Bookbinder, MD;  Location: [place];  Service: General;  Laterality: N/A;   ERCP  04/11/2016   ERCP with pancreatic stent and failed CBD stent after needle knife and conventional sphincterotomy   ERCP N/A 04/12/2016   Procedure: ENDOSCOPIC RETROGRADE CHOLANGIOPANCREATOGRAPHY (ERCP);  Surgeon: Clarene Essex, MD;  [facility]: [facility];  Service: Endoscopy;  Laterality: N/A;   FOOT SURGERY     toenail   INTRAUTERINE DEVICE INSERTION  08/07/2017   Mirena   removal of melanoma      There were no vitals filed for this visit.    Subjective  Assessment - 06/01/21 1101     Subjective When patient has the strong urge to urinate she will leak when close to the bathroom. Happens when she has delayed the urge. Feels like it just falls out of her. All of sudden a small piece of stool comes out of the anus without  an urge. Stool will come out while urinating and not feeling it come out. Makes patient paronoid to have sex.    Patient Stated Goals stop the fecal and urinary leakage    Currently in Pain? No/denies    Multiple Pain Sites No                [facility] PT Assessment - 06/01/21 0001       Assessment   Medical Diagnosis R159 incontinence of feces, unspecified fecal incontinence type; R32 urinary incontinence, unspecified type    Referring Provider (PT) Dr. Aundria Rud    Onset Date/Surgical Date --   past 2 years for urine and 1 year for fecal   Prior Therapy none      Precautions   Precautions Other (comment)    Precaution Comments melanoma      Restrictions   Weight Bearing Restrictions No      Balance Screen   Has the patient fallen in the past  6 months Yes    How many times? 1   trail walking and tripping on stump   Has the patient had a decrease in activity level because of a fear of falling?  No    Is the patient reluctant to leave their home because of a fear of falling?  No      Home Ecologist residence      Prior Function   Level of Independence Independent    Vocation Full time employment    Vocation Requirements office work    Leisure bootcamp 3 x per week      Cognition   Overall Cognitive Status Within Functional Limits for tasks assessed      Posture/Postural Control   Posture/Postural Control No significant limitations      ROM / Strength   AROM / PROM / Strength AROM;PROM;Strength      PROM   Right Hip External Rotation  60    Left Hip External Rotation  70      Strength   Right Hip Extension 5/5    Right Hip External Rotation  4/5     Right Hip ABduction 3/5    Left Hip Extension 4/5    Left Hip External Rotation 4/5    Left Hip ABduction 4-/5    Left Hip ADduction 4/5      Palpation   Spinal mobility T11-L4 PA glide    SI assessment  right ilium is rotated anteriorly; sacrum rotated left                        Objective measurements completed on examination: See above findings.     Pelvic Floor Special Questions - 06/01/21 0001     Prior Pregnancies Yes    Number of Pregnancies 4    Number of Vaginal Deliveries 4    Episiotomy Performed Yes   1 x   Currently Sexually Active Yes    Is this Painful Yes   initial penetration   Urinary Leakage Yes    Activities that cause leaking With strong urge    Fecal incontinence Yes   BM every 2-3 days; no straining; if feel the stool is there she will strain a little to get it out   Fluid intake increasing water, decaf tea and coffee    Caffeine beverages none    Skin Integrity Intact    Pelvic Floor Internal Exam Patient confirms identification and approves PT to assess pelvic floor and treatment    Exam Type Vaginal;Rectal    Palpation tightness on the sides of the introitus of the vagina; decreased movement of the puborectalis, decreased movement of the anterior anal canal, decreased movement of the coccyx    Strength --   at first both was 1/5 then ended at 2/5 with manual work             [facility] Adult PT Treatment/Exercise - 06/01/21 0001       Self-Care   Self-Care Other Self-Care Comments    Other Self-Care Comments  education on vaginal moisturizers to reduce dryness and where to apply, using 2 times per week vaginally and 1 time per day on the vulva area      Exercises   Exercises Other Exercises    Other Exercises  instructed patient on pelvic floor strengthening with isolation of the vaginal and rectal      Lumbar Exercises: Stretches   Piriformis Stretch Right;Left;1  rep;30 seconds    Piriformis Stretch Limitations sitting                      PT Education - 06/01/21 1203     Education Details Access Code: [place]; information on vaginal moisturizers    Person(s) Educated Patient    Methods Explanation;Demonstration;Verbal cues;Handout    Comprehension Returned demonstration;Verbalized understanding              PT Short Term Goals - 06/01/21 1218       PT SHORT TERM GOAL #1   Title independent with initial HEP for pelvic floor strength    Time 4    Period Weeks    Status New    Target Date 06/29/21      PT SHORT TERM GOAL #2   Title educated on vaginal moisturizers and lubricants for good vaginal health    Time 4    Period Weeks    Status New    Target Date 06/29/21      PT SHORT TERM GOAL #3   Title able to feel the stool come out of the anus due to improved anal sensation    Time 4    Period Weeks    Status New    Target Date 06/29/21               PT Long Term Goals - 06/01/21 1219       PT LONG TERM GOAL #1   Title independent with advanced HEP for core and pelvic floor strength    Time 4    Period Months    Status New    Target Date 10/01/21      PT LONG TERM GOAL #2   Title able to hold her urine as she is walking to the commode due to improve pelvic floor strength >/= 3/5    Time 4    Period Months    Status New    Target Date 10/01/21      PT LONG TERM GOAL #3   Title fecal leakage decreased >/= 80% due to improved puborectalis moving forward and strength >/= 3/5    Time 4    Period Months    Status New    Target Date 10/01/21      PT LONG TERM GOAL #4   Title able to contract the pelvic floor with lower abdominal engagement and not lifting rib cage due to improve strenth and reduce strain on the bladder    Time 4    Period Months    Status New    Target Date 10/01/21                    Plan - 06/01/21 1210     Clinical Impression Statement Patient is a 55 year old female with urinary and fecal leakage that started 2 years ago. She  will leak urine when she has a strong urge and just about to get to the bathroom. Patient will leak stool while urinating or at random times without an urge to have a bowel movement or feel it coming out. Pelvic floor strength rectally and vaginally was 1/5 but after manual work and tactile cues for the muscles to contract correctly the strength went to 2/5. She has decreased movement of the lateral sides of the introitus. Her puborectalis does not come forward and anterior wall of the rectum was not contracting well. Her coccyx had restrictive movement. Her right  ilium is rotated anteriorly and sacrum rotated right. Patient will lift her rib cage up to contract the pelvic floor. She was educated on how to keep her rib cage down and focus on pelvic floor contraction. Right hip abduction 3/5, left hip adduction 4/5, left hip extension is 4/5. Patient will benefit from skilled therapy to improve pelvic floor coordination to reduce leakage and increase core and hip strength.    Personal Factors and Comorbidities Comorbidity 2;Sex    Comorbidities Melanoma; osteomalencia; 4 vaginal birth of babies averaging 9 pounds.    Examination-Activity Limitations Continence;Toileting;Locomotion Level    Examination-Participation Restrictions Community Activity;Interpersonal Relationship    Stability/Clinical Decision Making Stable/Uncomplicated    Clinical Decision Making Low    Rehab Potential Excellent    PT Frequency 1x / week   every other week   PT Duration Other (comment)   4 months   PT Treatment/Interventions ADLs/Self Care Home Management;Biofeedback;Therapeutic activities;Therapeutic exercise;Neuromuscular re-education;Patient/family education;Manual techniques;Taping;Spinal Manipulations    PT Next Visit Plan correct right ilium and sacrum; gluteus medius strength; pelvic floor contraction with keeping ribs still, check rib cage expansion; toileting; manual work on puborectalis and ant. wall of rectum    PT  Home Exercise Plan Access Code: [place] and Agree with Plan of Care Patient             Patient will benefit from skilled therapeutic intervention in order to improve the following deficits and impairments:  Decreased coordination, Increased fascial restricitons, Decreased endurance, Decreased activity tolerance, Decreased strength  Visit Diagnosis: Muscle weakness (generalized) - Plan: PT plan of care cert/re-cert  Other lack of coordination - Plan: PT plan of care cert/re-cert     Problem List Patient Active Problem List   Diagnosis Date Noted   Adverse effect of other drugs, medicaments and biological substances, subsequent encounter 05/23/2021   Other allergic rhinitis 05/23/2021   Allergic conjunctivitis of both eyes 05/23/2021   History of asthma 05/23/2021   Oral allergy syndrome, subsequent encounter 05/23/2021   Family history of bicuspid aortic valve 11/28/2020   Other hyperlipidemia 11/28/2020   Chest pain of uncertain etiology [id number]   Thoracic aortic aneurysm without rupture ([place]) 10/30/2020   PAC (premature atrial contraction) 10/30/2020   Postoperative bile leak 04/12/2016   Acute hypokalemia 04/12/2016   Paroxysmal SVT (supraventricular tachycardia) ([place]) 04/12/2016   Bile leak, postoperative 04/12/2016   S/P laparoscopic cholecystectomy    History of Gallstone pancreatitis 04/07/2016   Pancreatitis due to biliary obstruction 04/07/2016   Cancer ([place])    Osteopenia     Earlie Counts, PT 06/01/21 12:25 PM  [facility] [address], [place], [place], [address] Phone: [phone]   Fax:  [phone]  Name: Laurie Terry MRN: [id number] Date of Birth: Aug 03, 1966

## 2021-06-01 NOTE — Patient Instructions (Addendum)
Moisturizers They are used in the vagina to hydrate the mucous membrane that make up the vaginal canal. Designed to keep a more normal acid balance (ph) Once placed in the vagina, it will last between two to three days.  Use 2-3 times per week at bedtime  Ingredients to avoid is glycerin and fragrance, can increase chance of infection Should not be used just before sex due to causing irritation Most are gels administered either in a tampon-shaped applicator or as a vaginal suppository. They are non-hormonal.   Types of Moisturizers(internal use)  Vitamin E vaginal suppositories- Whole foods, Amazon Moist Again Coconut oil- can break down condoms Julva- (Do no use if on Tamoxifen) amazon Yes moisturizer- amazon NeuEve Silk , NeuEve Silver for menopausal or over 65 (if have severe vaginal atrophy or cancer treatments use NeuEve Silk for  1 month than move to [employer]epsi)- [employer], Ceres.com Olive and Bee intimate cream- www.oliveandbee.com.au Mae vaginal moisturizer- Amazon Aloe    Creams to use externally on the Vulva area Albertson's (good for for cancer patients that had radiation to the area)- [place] or [employer].FlyingBasics.com.br V-magic cream - amazon Julva-amazon Vital "V Wild Yam salve ( help moisturize and help with thinning vulvar area, does have Orchard by Irwin Brakeman labial moisturizer (Amazon,  Coconut or olive oil aloe   Things to avoid in the vaginal area Do not use things to irritate the vulvar area No lotions just specialized creams for the vulva area- Neogyn, V-magic, No soaps; can use Aveeno or Calendula cleanser if needed. Must be gentle No deodorants No douches Good to sleep without underwear to let the vaginal area to air out No scrubbing: spread the lips to let warm water rinse over labias and pat dry  Access Code: [email or web address] URL: [email or web address] Date:  06/01/2021 Prepared by: Earlie Counts  Exercises Supine Pelvic Floor Contraction - 2 x daily - 7 x weekly - 1 sets - 10 reps - 10 sec hold Seated Piriformis Stretch with Trunk Bend - 1 x daily - 7 x weekly - 1 sets - 2 reps - 30 sec hold  [facility] [address] [place], [place] [address] Phone # [phone] Fax [phone]

## 2021-06-07 ENCOUNTER — Encounter: Payer: 59 | Admitting: Obstetrics and Gynecology

## 2021-06-18 ENCOUNTER — Telehealth: Payer: Self-pay

## 2021-06-18 DIAGNOSIS — I712 Thoracic aortic aneurysm, without rupture, unspecified: Secondary | ICD-10-CM

## 2021-06-18 NOTE — Telephone Encounter (Signed)
Order placed for MRA due in March 2023 dx TAA. Sent to scheduling pool to be scheduled.

## 2021-06-18 NOTE — Telephone Encounter (Signed)
-----   Message from Werner Lean, MD sent at 05/25/2021  7:50 AM EDT ----- Results: Stable Aorta Plan: Will do MRA in March of 2023 and then decide echo or MRA screening post  Werner Lean, MD

## 2021-06-26 NOTE — Telephone Encounter (Signed)
Referral placed. I will close once it is scheduled.

## 2021-07-13 ENCOUNTER — Encounter: Payer: Self-pay | Admitting: Physical Therapy

## 2021-07-13 ENCOUNTER — Other Ambulatory Visit: Payer: Self-pay

## 2021-07-13 ENCOUNTER — Ambulatory Visit: Payer: 59 | Attending: Family Medicine | Admitting: Physical Therapy

## 2021-07-13 DIAGNOSIS — R278 Other lack of coordination: Secondary | ICD-10-CM | POA: Insufficient documentation

## 2021-07-13 DIAGNOSIS — M6281 Muscle weakness (generalized): Secondary | ICD-10-CM | POA: Diagnosis present

## 2021-07-13 NOTE — Therapy (Signed)
[facility] [place], [place], [address] Phone: [phone]   Fax:  [phone]  Physical Therapy Treatment  Patient Details  Name: Laurie Terry MRN: [id number] Date of Birth: [birth year] Referring Provider (PT): Dr. Aundria Rud   Encounter Date: 07/13/2021   PT End of Session - 07/13/21 0941     Visit Number 2    Date for PT Re-Evaluation 10/01/21    Authorization Type UHC    Authorization - Visit Number 2    Authorization - Number of Visits 60    PT Start Time 0845    PT Stop Time 0930    PT Time Calculation (min) 45 min    Activity Tolerance Patient tolerated treatment well    Behavior During Therapy [facility] for tasks assessed/performed             Past Medical History:  Diagnosis Date   Allergic rhinitis    Aortic aneurysm ([place]) 09/23/2020   Cancer ([place])    Melanoma   Cervical dysplasia 2007   low-grade SIL, normal Pap smears since then   Family history of adverse reaction to anesthesia    mom has h/o post op nausea/vomiting   Intermittent palpitations    IUD    inserted 03/2002.04/06/2007, 06/03/2012   Ocular migraine    Osteomalacia    Pancreatitis    Seafood allergy     Past Surgical History:  Procedure Laterality Date   BUNIONECTOMY  2018   CHOLECYSTECTOMY N/A 04/09/2016   Procedure: LAPAROSCOPIC CHOLECYSTECTOMY ;  Surgeon: Rolm Bookbinder, MD;  Location: [place];  Service: General;  Laterality: N/A;   ERCP  04/11/2016   ERCP with pancreatic stent and failed CBD stent after needle knife and conventional sphincterotomy   ERCP N/A 04/12/2016   Procedure: ENDOSCOPIC RETROGRADE CHOLANGIOPANCREATOGRAPHY (ERCP);  Surgeon: Clarene Essex, MD;  Location: [facility] ENDOSCOPY;  Service: Endoscopy;  Laterality: N/A;   FOOT SURGERY     toenail   INTRAUTERINE DEVICE INSERTION  08/07/2017   Mirena   removal of melanoma      There were no vitals filed for this visit.   Subjective  Assessment - 07/13/21 0851     Subjective I feel I am better. Bowel incontence is better by 65% and is not weekly now. It has happen 2 times since last visit. Bladder leakage is 50% better.    Patient Stated Goals stop the fecal and urinary leakage    Currently in Pain? No/denies    Multiple Pain Sites No                            Pelvic Floor Special Questions - 07/13/21 0001     Biofeedback patient was able to relax to 2 uv, she ia ble to do a quick flick to 20 uv; at first she fatiqued at 6 seconds with pelvic floor but when she was able to engage the abdominals correctly she was able to hold for 10 seconds; patient had difficulty with relaxation of pelvic floor after contraction    Biofeedback sensor type Vaginal   vagina              [facility] Adult PT Treatment/Exercise - 07/13/21 0001       Neuro Re-ed    Neuro Re-ed Details  diaphragmatic breathing with therapist guiding the lower rib cage and having her breath into the lower abdomen to relax the  pelvic floor      Lumbar Exercises: Stretches   Quadruped Mid Back Stretch 4 reps;10 seconds    Quadruped Mid Back Stretch Limitations while therapist released the anococcygeal lligament      Lumbar Exercises: Quadruped   Madcat/Old Horse 15 reps    Madcat/Old Horse Limitations with tactile cues to engage the lower abdomen; using the suction cup on different places on the lumbar to release the fascia      Manual Therapy   Manual Therapy Joint mobilization;Myofascial release;Soft tissue mobilization    Joint Mobilization gapping of the right L3-L5 facets in left sidely    Soft tissue mobilization manual work to the lumbar paraspinals; along the right SI joint and sides of the coccyx    Myofascial Release suction cup to the low thoracic and lumbar area                       PT Short Term Goals - 07/13/21 0946       PT SHORT TERM GOAL #1   Title independent with initial HEP for pelvic floor  strength    Time 4    Period Weeks    Status On-going    Target Date 06/29/21      PT SHORT TERM GOAL #2   Title educated on vaginal moisturizers and lubricants for good vaginal health    Time 4    Period Weeks    Status On-going      PT SHORT TERM GOAL #3   Title able to feel the stool come out of the anus due to improved anal sensation    Time 4    Period Weeks    Status On-going               PT Long Term Goals - 06/01/21 1219       PT LONG TERM GOAL #1   Title independent with advanced HEP for core and pelvic floor strength    Time 4    Period Months    Status New    Target Date 10/01/21      PT LONG TERM GOAL #2   Title able to hold her urine as she is walking to the commode due to improve pelvic floor strength >/= 3/5    Time 4    Period Months    Status New    Target Date 10/01/21      PT LONG TERM GOAL #3   Title fecal leakage decreased >/= 80% due to improved puborectalis moving forward and strength >/= 3/5    Time 4    Period Months    Status New    Target Date 10/01/21      PT LONG TERM GOAL #4   Title able to contract the pelvic floor with lower abdominal engagement and not lifting rib cage due to improve strenth and reduce strain on the bladder    Time 4    Period Months    Status New    Target Date 10/01/21                   Plan - 07/13/21 0855     Clinical Impression Statement Patient reports the fecal leakage is 65% better and she only had 2 accidents in 1 month compared to every few days. Patient reports her urinary leakage is 50% better. She is able to relax her pelvic floor to 2 uv. She has difficulty with relaxing her pelvic  floor after a contraction. Patient is able to do a quick flick to 20 uv. She is able to hold a 10 second contraction above 12.5 uv with correct abdominal engagement. Patient pelvis in correct alignment after manual work. Patient will benefit from skilled therapy to improve pelvic floor coordination to  reduce leakage and increase core and hip strength.    Personal Factors and Comorbidities Comorbidity 2;Sex    Comorbidities Melanoma; osteomalencia; 4 vaginal birth of babies averaging 9 pounds.    Examination-Activity Limitations Continence;Toileting;Locomotion Level    Examination-Participation Restrictions Community Activity;Interpersonal Relationship    Stability/Clinical Decision Making Stable/Uncomplicated    Rehab Potential Excellent    PT Frequency 1x / week   every other week   PT Duration Other (comment)   4 months   PT Treatment/Interventions ADLs/Self Care Home Management;Biofeedback;Therapeutic activities;Therapeutic exercise;Neuromuscular re-education;Patient/family education;Manual techniques;Taping;Spinal Manipulations    PT Next Visit Plan toileting with diaphragmatic breathing; using the biofeedback with pelvic floor contraction to exercise the core and hip abduction and extension; ask if she can feel the stool leak out; review vaginal lubricants and moisturizers    PT Home Exercise Plan Access Code: [place]    Recommended Other Services MD signed initial note    Consulted and Agree with Plan of Care Patient             Patient will benefit from skilled therapeutic intervention in order to improve the following deficits and impairments:  Decreased coordination, Increased fascial restricitons, Decreased endurance, Decreased activity tolerance, Decreased strength  Visit Diagnosis: Muscle weakness (generalized)  Other lack of coordination     Problem List Patient Active Problem List   Diagnosis Date Noted   Adverse effect of other drugs, medicaments and biological substances, subsequent encounter 05/23/2021   Other allergic rhinitis 05/23/2021   Allergic conjunctivitis of both eyes 05/23/2021   History of asthma 05/23/2021   Oral allergy syndrome, subsequent encounter 05/23/2021   Family history of bicuspid aortic valve 11/28/2020   Other hyperlipidemia  11/28/2020   Chest pain of uncertain etiology 63/87/5643   Thoracic aortic aneurysm without rupture 10/30/2020   PAC (premature atrial contraction) 10/30/2020   Postoperative bile leak 04/12/2016   Acute hypokalemia 04/12/2016   Paroxysmal SVT (supraventricular tachycardia) ([place]) 04/12/2016   Bile leak, postoperative 04/12/2016   S/P laparoscopic cholecystectomy    History of Gallstone pancreatitis 04/07/2016   Pancreatitis due to biliary obstruction 04/07/2016   Cancer ([place])    Osteopenia     Earlie Counts, PT 07/13/21 9:48 AM  [facility] @ [place], [place], [address] Phone: [phone]   Fax:  [phone]  Name: Laurie Terry MRN: [id number] Date of Birth: 05/28/1966

## 2021-07-18 NOTE — Telephone Encounter (Signed)
No PA required.  Encounter closed.

## 2021-07-18 NOTE — Telephone Encounter (Signed)
MRI scheduled 08/24/2021 I will route to [facility] for possible Prior auth

## 2021-07-27 ENCOUNTER — Encounter: Payer: 59 | Admitting: Physical Therapy

## 2021-08-08 ENCOUNTER — Other Ambulatory Visit: Payer: Self-pay

## 2021-08-08 ENCOUNTER — Encounter: Payer: Self-pay | Admitting: Physical Therapy

## 2021-08-08 ENCOUNTER — Ambulatory Visit: Payer: 59 | Attending: Family Medicine | Admitting: Physical Therapy

## 2021-08-08 DIAGNOSIS — R278 Other lack of coordination: Secondary | ICD-10-CM | POA: Insufficient documentation

## 2021-08-08 DIAGNOSIS — M6281 Muscle weakness (generalized): Secondary | ICD-10-CM | POA: Insufficient documentation

## 2021-08-08 NOTE — Patient Instructions (Signed)
Access Code: [email or web address] URL: [email or web address] Date: 08/08/2021 Prepared by: Earlie Counts  Exercises Supine Pelvic Floor Contraction - 2 x daily - 7 x weekly - 1 sets - 10 reps - 10 sec hold Seated Piriformis Stretch with Trunk Bend - 1 x daily - 7 x weekly - 1 sets - 2 reps - 30 sec hold Supine March - 1 x daily - 3 x weekly - 1 sets - 10 reps Beginner Front Arm Support - 1 x daily - 3 x weekly - 1 sets - 10 reps Half Kneeling Anti-Rotation Press - Forward Leg Opposite Anchor Side - 1 x daily - 1 x weekly - 1 sets - 10 reps [facility] [address], [place] [address] [place], [place] [address] Phone # [phone] Fax [phone]

## 2021-08-08 NOTE — Therapy (Signed)
[facility] @ [place], [place], [address] Phone: [phone]   Fax:  [phone]  Physical Therapy Treatment  Patient Details  Name: Laurie Terry MRN: [id number] Date of Birth: March 07, 1966 Referring Provider (PT): Dr. Aundria Rud   Encounter Date: 08/08/2021   PT End of Session - 08/08/21 0926     Visit Number 3    Date for PT Re-Evaluation 10/01/21    Authorization Type U[employer]    Authorization - Visit Number 3    Authorization - Number of Visits 21    PT Start Time 0845    PT Stop Time 0925    PT Time Calculation (min) 40 min    Activity Tolerance Patient tolerated treatment well    Behavior During Therapy [facility] for tasks assessed/performed             Past Medical History:  Diagnosis Date   Allergic rhinitis    Aortic aneurysm ([place]) 09/23/2020   Cancer ([place])    Melanoma   Cervical dysplasia 2007   low-grade SIL, normal Pap smears since then   Family history of adverse reaction to anesthesia    mom has h/o post op nausea/vomiting   Intermittent palpitations    IUD    inserted 03/2002.04/06/2007, 06/03/2012   Ocular migraine    Osteomalacia    Pancreatitis    Seafood allergy     Past Surgical History:  Procedure Laterality Date   BUNIONECTOMY  2018   CHOLECYSTECTOMY N/A 04/09/2016   Procedure: LAPAROSCOPIC CHOLECYSTECTOMY ;  Surgeon: Rolm Bookbinder, MD;  Location: [facility];  Service: General;  Laterality: N/A;   ERCP  04/11/2016   ERCP with pancreatic stent and failed CBD stent after needle knife and conventional sphincterotomy   ERCP N/A 04/12/2016   Procedure: ENDOSCOPIC RETROGRADE CHOLANGIOPANCREATOGRAPHY (ERCP);  Surgeon: Clarene Essex, MD;  [facility]: [facility] END[facility]COPY;  Service: Endoscopy;  Laterality: N/A;   FOOT SURGERY     toenail   INTRAUTERINE DEVICE INSERTION  08/07/2017   Mirena   removal of melanoma      There were no vitals filed for this visit.   Subjective  Assessment - 08/08/21 0850     Subjective I have not had a big change from last visit. Patient had not had bowel incontinence since last visit.    Patient Stated Goals stop the fecal and urinary leakage    Currently in Pain? No/denies    Multiple Pain Sites No                            Pelvic Floor Special Questions - 08/08/21 0001     Urinary Leakage Yes    Activities that cause leaking With strong urge   when walking into the bathroom   Biofeedback sensor type Surface   vagina              [facility] Adult PT Treatment/Exercise - 08/08/21 0001       Lumbar Exercises: Standing   Other Standing Lumbar Exercises 1/2 knee pulling red band away from door and green band around the forward knee and keeping position while contracting the pelvic floor and EMG 10x each way      Lumbar Exercises: Supine   Bent Knee Raise 20 reps;1 second    Bent Knee Raise Limitations with EMG, contracting the pelvic floor, abdominal bracing      Lumbar Exercises: Quadruped   Straight  Leg Raise 10 reps;1 second    Straight Leg Raises Limitations each side, contract pelvic floor tactile cues to keep spinal neutral                     PT Education - 08/08/21 0926     Education Details Access Code: [place]    Person(s) Educated Patient    Methods Explanation;Verbal cues;Handout    Comprehension Returned demonstration;Verbalized understanding              PT Short Term Goals - 08/08/21 0851       PT SHORT TERM GOAL #1   Title independent with initial HEP for pelvic floor strength    Time 4    Period Weeks    Status Achieved      PT SHORT TERM GOAL #2   Title educated on vaginal moisturizers and lubricants for good vaginal health    Time 4    Period Weeks    Status Achieved    Target Date 06/29/21      PT SHORT TERM GOAL #3   Title able to feel the stool come out of the anus due to improved anal sensation    Time 4    Period Weeks    Status Achieved                PT Long Term Goals - 06/01/21 1219       PT LONG TERM GOAL #1   Title independent with advanced HEP for core and pelvic floor strength    Time 4    Period Months    Status New    Target Date 10/01/21      PT LONG TERM GOAL #2   Title able to hold her urine as she is walking to the commode due to improve pelvic floor strength >/= 3/5    Time 4    Period Months    Status New    Target Date 10/01/21      PT LONG TERM GOAL #3   Title fecal leakage decreased >/= 80% due to improved puborectalis moving forward and strength >/= 3/5    Time 4    Period Months    Status New    Target Date 10/01/21      PT LONG TERM GOAL #4   Title able to contract the pelvic floor with lower abdominal engagement and not lifting rib cage due to improve strenth and reduce strain on the bladder    Time 4    Period Months    Status New    Target Date 10/01/21                   Plan - 08/08/21 0907     Clinical Impression Statement Patient has not had fecal incontinence since last visit. Her urinary leakage happens when she has the urge and walking to the bathroom but she is not having gushes of urine just drops. Patient is able to contract her lower abdominals correctly with pelvic floor contraction. She is learning how to contract the pelvic floor with leg movement in supine and quadruped. Patient is able to contract the pelvic floor for 10 seconds now with good plateau. Patient does better with some hip adduction to incorporate the pelvic floor. Patient does not feel she is fully emptying her stool at times. Patient will benefit from skilled therapy to improve pelvic floor coordination to reduce leakage and increased core and hip strength.  Personal Factors and Comorbidities Comorbidity 2;Sex    Comorbidities Melanoma; osteomalencia; 4 vaginal birth of babies averaging 9 pounds.    Examination-Activity Limitations Continence;Toileting;Locomotion Level     Examination-Participation Restrictions Community Activity;Interpersonal Relationship    Stability/Clinical Decision Making Stable/Uncomplicated    Rehab Potential Excellent    PT Frequency 1x / week   every other week   PT Duration Other (comment)   4 months   PT Treatment/Interventions ADLs/Self Care Home Management;Biofeedback;Therapeutic activities;Therapeutic exercise;Neuromuscular re-education;Patient/family education;Manual techniques;Taping;Spinal Manipulations    PT Next Visit Plan using the biofeedback with pelvic floor contraction to exercise the core and hip abduction and extension; ask if she can feel the stool leak out; toileting to empty stool; go over gym workout to contract the pelvic floor    PT Home Exercise Plan Access Code: [place] and Agree with Plan of Care Patient             Patient will benefit from skilled therapeutic intervention in order to improve the following deficits and impairments:  Decreased coordination, Increased fascial restricitons, Decreased endurance, Decreased activity tolerance, Decreased strength  Visit Diagnosis: Muscle weakness (generalized)  Other lack of coordination     Problem List Patient Active Problem List   Diagnosis Date Noted   Adverse effect of other drugs, medicaments and biological substances, subsequent encounter 05/23/2021   Other allergic rhinitis 05/23/2021   Allergic conjunctivitis of both eyes 05/23/2021   History of asthma 05/23/2021   Oral allergy syndrome, subsequent encounter 05/23/2021   Family history of bicuspid aortic valve 11/28/2020   Other hyperlipidemia 11/28/2020   Chest pain of uncertain etiology 95/32/0233   Thoracic aortic aneurysm without rupture 10/30/2020   PAC (premature atrial contraction) 10/30/2020   Postoperative bile leak 04/12/2016   Acute hypokalemia 04/12/2016   Paroxysmal SVT (supraventricular tachycardia) ([place]) 04/12/2016   Bile leak, postoperative 04/12/2016   S/P  laparoscopic cholecystectomy    History of Gallstone pancreatitis 04/07/2016   Pancreatitis due to biliary obstruction 04/07/2016   Cancer ([place])    Osteopenia     Earlie Counts, PT 08/08/21 9:31 AM  [facility] @ [place], [place], [address] Phone: [phone]   Fax:  [phone]  Name: Laurie Terry MRN: [id number] Date of Birth: [birth year]

## 2021-08-08 NOTE — Telephone Encounter (Signed)
Breast MRI is scheduled 08/24/21.   Laurie Terry has already checked on ins in 05/30/21 telephone encounter.

## 2021-08-20 ENCOUNTER — Encounter: Payer: Self-pay | Admitting: Physical Therapy

## 2021-08-20 ENCOUNTER — Ambulatory Visit: Payer: 59 | Admitting: Physical Therapy

## 2021-08-20 ENCOUNTER — Other Ambulatory Visit: Payer: Self-pay

## 2021-08-20 DIAGNOSIS — R278 Other lack of coordination: Secondary | ICD-10-CM

## 2021-08-20 DIAGNOSIS — M6281 Muscle weakness (generalized): Secondary | ICD-10-CM | POA: Diagnosis not present

## 2021-08-20 NOTE — Therapy (Signed)
[facility] @ [place], [place], [address] Phone: [phone]   Fax:  [phone]  Physical Therapy Treatment  Patient Details  Name: Laurie Terry MRN: [id number] Date of Birth: Nov 16, 1965 Referring Provider (PT): Dr. Aundria Rud   Encounter Date: 08/20/2021   PT End of Session - 08/20/21 0849     Visit Number 4    Date for PT Re-Evaluation 10/01/21    Authorization Type UHC    Authorization - Visit Number 4    Authorization - Number of Visits 60    PT Start Time 0800    PT Stop Time 0840    PT Time Calculation (min) 40 min    Activity Tolerance Patient tolerated treatment well    Behavior During Therapy [facility] for tasks assessed/performed             Past Medical History:  Diagnosis Date   Allergic rhinitis    Aortic aneurysm ([place]) 09/23/2020   Cancer ([place])    Melanoma   Cervical dysplasia 2007   low-grade SIL, normal Pap smears since then   Family history of adverse reaction to anesthesia    mom has h/o post op nausea/vomiting   Intermittent palpitations    IUD    inserted 03/2002.04/06/2007, 06/03/2012   Ocular migraine    Osteomalacia    Pancreatitis    Seafood allergy     Past Surgical History:  Procedure Laterality Date   BUNIONECTOMY  2018   CHOLECYSTECTOMY N/A 04/09/2016   Procedure: LAPAROSCOPIC CHOLECYSTECTOMY ;  Surgeon: Rolm Bookbinder, MD;  Location: [facility];  Service: General;  Laterality: N/A;   ERCP  04/11/2016   ERCP with pancreatic stent and failed CBD stent after needle knife and conventional sphincterotomy   ERCP N/A 04/12/2016   Procedure: ENDOSCOPIC RETROGRADE CHOLANGIOPANCREATOGRAPHY (ERCP);  Surgeon: Clarene Essex, MD;  Location: [facility] ENDOSCOPY;  Service: Endoscopy;  Laterality: N/A;   FOOT SURGERY     toenail   INTRAUTERINE DEVICE INSERTION  08/07/2017   Mirena   removal of melanoma      There were no vitals filed for this visit.   Subjective  Assessment - 08/20/21 0802     Subjective I had one bowel inconinence issue with it on the anal opening but is not frequent. Urinary leakage with on the way to the bathroom and other time at her desk. I had the urge then I leaked. I am able to fully empty my stool.    Patient Stated Goals stop the fecal and urinary leakage    Currently in Pain? No/denies    Multiple Pain Sites No                            Pelvic Floor Special Questions - 08/20/21 0001     Biofeedback sensor type Surface   vagina              [facility] Adult PT Treatment/Exercise - 08/20/21 0001       Lumbar Exercises: Standing   Other Standing Lumbar Exercises squats, lunges, bicep curl, standing row, with pelvic EMG, educating on how to incorporate the core and pelvic floor      Lumbar Exercises: Supine   Other Supine Lumbar Exercises bicycle with pelvic floor contraction, then do with trunk rotation    Other Supine Lumbar Exercises chest press with pelvic floor EMG, ball squeeze to add the pelvic floor  Lumbar Exercises: Quadruped   Straight Leg Raise 10 reps    Straight Leg Raises Limitations each side, with pelvic EMG; with core engagement    Other Quadruped Lumbar Exercises Plank with ball squeeze, verbal cues to engage the pelvic floor, lower abdominal;                     PT Education - 08/20/21 0848     Education Details went over the patient workout program and showed her how to incorporate her pelvic floor and lower abdominals to reduce leakage    Person(s) Educated Patient    Methods Explanation;Demonstration;Verbal cues    Comprehension Verbalized understanding;Returned demonstration              PT Short Term Goals - 08/08/21 0851       PT SHORT TERM GOAL #1   Title independent with initial HEP for pelvic floor strength    Time 4    Period Weeks    Status Achieved      PT SHORT TERM GOAL #2   Title educated on vaginal moisturizers and lubricants for  good vaginal health    Time 4    Period Weeks    Status Achieved    Target Date 06/29/21      PT SHORT TERM GOAL #3   Title able to feel the stool come out of the anus due to improved anal sensation    Time 4    Period Weeks    Status Achieved               PT Long Term Goals - 08/20/21 2376       PT LONG TERM GOAL #1   Title independent with advanced HEP for core and pelvic floor strength    Time 4    Period Months    Status On-going      PT LONG TERM GOAL #2   Title able to hold her urine as she is walking to the commode due to improve pelvic floor strength >/= 3/5    Time 4    Period Months    Status On-going      PT LONG TERM GOAL #3   Title fecal leakage decreased >/= 80% due to improved puborectalis moving forward and strength >/= 3/5    Baseline not as frequent    Time 4    Period Months    Status On-going      PT LONG TERM GOAL #4   Title able to contract the pelvic floor with lower abdominal engagement and not lifting rib cage due to improve strenth and reduce strain on the bladder    Time 4    Period Months    Status On-going                   Plan - 08/20/21 0849     Clinical Impression Statement Patient has had some fecal leakage outside the anus one time since last treatment. Patient has had 2 episodes of urinary leakage. She is doing her HEP. Patient has learned how to incorporate the pelvic floor with her workout and add the core for stability and see how the pelvic floor contracts. She is able to contract to 20 uv at times. Patient has more difficutty with pelvic floor contraction in standing and quadruped. Patient will benefit from skilled therapy to improve pelvic floor coordination to reduce herleakage and increase core and hip strength.    Personal Factors  and Comorbidities Comorbidity 2;Sex    Comorbidities Melanoma; osteomalencia; 4 vaginal birth of babies averaging 9 pounds.    Examination-Activity Limitations  Continence;Toileting;Locomotion Level    Examination-Participation Restrictions Community Activity;Interpersonal Relationship    Stability/Clinical Decision Making Stable/Uncomplicated    Rehab Potential Excellent    PT Frequency 1x / week   every other week   PT Duration Other (comment)   4 months   PT Treatment/Interventions ADLs/Self Care Home Management;Biofeedback;Therapeutic activities;Therapeutic exercise;Neuromuscular re-education;Patient/family education;Manual techniques;Taping;Spinal Manipulations    PT Next Visit Plan using the biofeedback with pelvic floor contraction to exercise the core and hip abduction and extension, working in standing and quadruped    PT Home Exercise Plan Access Code: [place] and Agree with Plan of Care Patient             Patient will benefit from skilled therapeutic intervention in order to improve the following deficits and impairments:  Decreased coordination, Increased fascial restricitons, Decreased endurance, Decreased activity tolerance, Decreased strength  Visit Diagnosis: Muscle weakness (generalized)  Other lack of coordination     Problem List Patient Active Problem List   Diagnosis Date Noted   Adverse effect of other drugs, medicaments and biological substances, subsequent encounter 05/23/2021   Other allergic rhinitis 05/23/2021   Allergic conjunctivitis of both eyes 05/23/2021   History of asthma 05/23/2021   Oral allergy syndrome, subsequent encounter 05/23/2021   Family history of bicuspid aortic valve 11/28/2020   Other hyperlipidemia 11/28/2020   Chest pain of uncertain etiology 09/15/4974   Thoracic aortic aneurysm without rupture 10/30/2020   PAC (premature atrial contraction) 10/30/2020   Postoperative bile leak 04/12/2016   Acute hypokalemia 04/12/2016   Paroxysmal SVT (supraventricular tachycardia) ([place]) 04/12/2016   Bile leak, postoperative 04/12/2016   S/P laparoscopic cholecystectomy    History  of Gallstone pancreatitis 04/07/2016   Pancreatitis due to biliary obstruction 04/07/2016   Cancer ([place])    Osteopenia     Earlie Counts, PT 08/20/21 8:53 AM  [facility] @ [place], [place], [address] Phone: [phone]   Fax:  [phone]  Name: Laurie Terry MRN: [id number] Date of Birth: Jan 10, 1966

## 2021-08-24 ENCOUNTER — Ambulatory Visit
Admission: RE | Admit: 2021-08-24 | Discharge: 2021-08-24 | Disposition: A | Discharge status: Home / Self Care | Payer: 59 | Source: Ambulatory Visit | Attending: Obstetrics and Gynecology | Admitting: Obstetrics and Gynecology

## 2021-08-24 DIAGNOSIS — Z9189 Other specified personal risk factors, not elsewhere classified: Secondary | ICD-10-CM

## 2021-08-24 DIAGNOSIS — Z803 Family history of malignant neoplasm of breast: Secondary | ICD-10-CM

## 2021-08-24 MED ORDER — GADOBUTROL 1 MMOL/ML IV SOLN
7.0000 mL | Freq: Once | INTRAVENOUS | Status: AC | PRN
  Administered 2021-08-24: 7 mL via INTRAVENOUS

## 2021-08-31 ENCOUNTER — Encounter: Payer: 59 | Admitting: Physical Therapy

## 2021-09-21 ENCOUNTER — Other Ambulatory Visit: Payer: Self-pay

## 2021-09-21 ENCOUNTER — Ambulatory Visit: Payer: 59 | Attending: Family Medicine | Admitting: Physical Therapy

## 2021-09-21 ENCOUNTER — Encounter: Payer: Self-pay | Admitting: Physical Therapy

## 2021-09-21 DIAGNOSIS — R278 Other lack of coordination: Secondary | ICD-10-CM | POA: Diagnosis present

## 2021-09-21 DIAGNOSIS — M6281 Muscle weakness (generalized): Secondary | ICD-10-CM | POA: Insufficient documentation

## 2021-09-21 NOTE — Therapy (Signed)
[facility] @ [place], [place], [address] Phone: [phone]   Fax:  [phone]  Physical Therapy Treatment  Patient Details  Name: Laurie Terry MRN: [id number] Date of Birth: 07-11-1966 Referring Provider (PT): Dr. Aundria Rud   Encounter Date: 09/21/2021   PT End of Session - 09/21/21 1211     Visit Number 5    Date for PT Re-Evaluation 11/26/21    Authorization Type UHC    Authorization - Visit Number 5    Authorization - Number of Visits 44    PT Start Time 0845    PT Stop Time 0925    PT Time Calculation (min) 40 min    Activity Tolerance Patient tolerated treatment well    Behavior During Therapy [facility] for tasks assessed/performed             Past Medical History:  Diagnosis Date   Allergic rhinitis    Aortic aneurysm ([place]) 09/23/2020   Cancer ([place])    Melanoma   Cervical dysplasia 2007   low-grade SIL, normal Pap smears since then   Family history of adverse reaction to anesthesia    mom has h/o post op nausea/vomiting   Intermittent palpitations    IUD    inserted 03/2002.04/06/2007, 06/03/2012   Ocular migraine    Osteomalacia    Pancreatitis    Seafood allergy     Past Surgical History:  Procedure Laterality Date   BUNIONECTOMY  2018   CHOLECYSTECTOMY N/A 04/09/2016   Procedure: LAPAROSCOPIC CHOLECYSTECTOMY ;  Surgeon: Rolm Bookbinder, MD;  Location: [facility];  Service: General;  Laterality: N/A;   ERCP  04/11/2016   ERCP with pancreatic stent and failed CBD stent after needle knife and conventional sphincterotomy   ERCP N/A 04/12/2016   Procedure: ENDOSCOPIC RETROGRADE CHOLANGIOPANCREATOGRAPHY (ERCP);  Surgeon: Clarene Essex, MD;  Location: [facility] ENDOSCOPY;  Service: Endoscopy;  Laterality: N/A;   FOOT SURGERY     toenail   INTRAUTERINE DEVICE INSERTION  08/07/2017   Mirena   removal of melanoma      There were no vitals filed for this visit.   Subjective  Assessment - 09/21/21 0849     Subjective I did so well after the first session but not this time. I have been doing the exercises. I have had more incidences of urine leakage and have to chnge my underwear. Typically when she is close to the bathroom. I had one issue with one small piece of stool come out.    Patient Stated Goals stop the fecal and urinary leakage    Currently in Pain? No/denies    Multiple Pain Sites No                [facility] PT Assessment - 09/21/21 0001       Assessment   Medical Diagnosis R159 incontinence of feces, unspecified fecal incontinence type; R32 urinary incontinence, unspecified type    Referring Provider (PT) Dr. Aundria Rud    Onset Date/Surgical Date --   past 2 years for urine and 1 year for fecal   Prior Therapy none      Precautions   Precautions Other (comment)    Precaution Comments melanoma      Restrictions   Weight Bearing Restrictions No      [place] residence      Prior Function   Level of Independence Independent    Vocation  Full time employment    Vocation Requirements office work    Leisure bootcamp 3 x per week      Cognition   Overall Cognitive Status Within Functional Limits for tasks assessed      Posture/Postural Control   Posture/Postural Control No significant limitations      PROM   Right Hip External Rotation  60    Left Hip External Rotation  70      Strength   Right Hip Extension 5/5    Right Hip External Rotation  4/5    Right Hip ABduction 4/5    Left Hip Extension 4/5    Left Hip External Rotation 4/5    Left Hip ABduction 4-/5    Left Hip ADduction 4/5      Palpation   Spinal mobility T11-L4 PA glide    SI assessment  right ilium is rotated anteriorly; sacrum rotated left                        Pelvic Floor Special Questions - 09/21/21 0001     Urinary Leakage Yes    Activities that cause leaking With strong urge   when walking  into the bathroom   Pelvic Floor Internal Exam Patient confirms identification and approves PT to assess pelvic floor and treatment    Exam Type Vaginal    Palpation sometimes when tighten in standing will bear down anally    Strength fair squeeze, definite lift   supine; standing 2/5 but with verbal cues can be 3/5              [facility] Adult PT Treatment/Exercise - 09/21/21 0001       Neuro Re-ed    Neuro Re-ed Details  tactile cues to the pelvic floor to contract in a circular way with a lift and having the patient feel the muscles contract in supine, standing, and during a lunge      Manual Therapy   Manual Therapy Internal Pelvic Floor    Internal Pelvic Floor manual work to the perineal body, levator ani, along the introitus and urogenital diaphgram                     PT Education - 09/21/21 1211     Education Details education on how to contract the pelvic floor with circular contraction and lift    Person(s) Educated Patient    Methods Explanation    Comprehension Verbalized understanding              PT Short Term Goals - 09/21/21 1220       PT SHORT TERM GOAL #1   Title independent with initial HEP for pelvic floor strength    Time 4    Period Weeks    Status Achieved    Target Date 06/29/21      PT SHORT TERM GOAL #2   Title educated on vaginal moisturizers and lubricants for good vaginal health    Time 4    Period Weeks    Status Achieved      PT SHORT TERM GOAL #3   Title able to feel the stool come out of the anus due to improved anal sensation    Time 4    Period Weeks    Status Achieved               PT Long Term Goals - 09/21/21 1220  PT LONG TERM GOAL #1   Title independent with advanced HEP for core and pelvic floor strength    Time 4    Period Months    Status On-going    Target Date 10/01/21      PT LONG TERM GOAL #2   Title able to hold her urine as she is walking to the commode due to improve pelvic  floor strength >/= 3/5    Time 4    Period Months    Status On-going      PT LONG TERM GOAL #3   Title fecal leakage decreased >/= 80% due to improved puborectalis moving forward and strength >/= 3/5    Baseline not as frequent    Time 4    Period Months    Status On-going      PT LONG TERM GOAL #4   Title able to contract the pelvic floor with lower abdominal engagement and not lifting rib cage due to improve strenth and reduce strain on the bladder    Time 4    Period Months    Status On-going                   Plan - 09/21/21 1213     Clinical Impression Statement Patient was doing well with reduction in urinary leakage but this past week she has increased times she will leak urine as she is walking to the bathroom and have to change her underwear. Patient has had one episode of a small piece of stool coming out without her knowing. Her pelvic floor strength increased to 3/5 after manual work. She had tightness in the introitus, perineal body, urogenital diaphram, and levator ani. Patient was able to perform a lift after manual work. Patient pelvic floor contraction in standing is 2/5 and patient at times will bulge the perineal body. Patient will benefit from skilled therapy to improve pelvic floor coordination to reduce her leakage and increase her core and hip strength.    Personal Factors and Comorbidities Comorbidity 2;Sex    Comorbidities Melanoma; osteomalencia; 4 vaginal birth of babies averaging 9 pounds.    Examination-Activity Limitations Continence;Toileting;Locomotion Level    Examination-Participation Restrictions Community Activity;Interpersonal Relationship    Stability/Clinical Decision Making Stable/Uncomplicated    Rehab Potential Excellent    PT Frequency 1x / week   every 3-4 weeks   PT Duration 8 weeks    PT Treatment/Interventions ADLs/Self Care Home Management;Biofeedback;Therapeutic activities;Therapeutic exercise;Neuromuscular  re-education;Patient/family education;Manual techniques;Taping;Spinal Manipulations    PT Next Visit Plan manual work to pelvic floor and work on Ambulance person;  work in standing with tactile cues to contract the pelvic floor; quick contraction with quick release    PT Home Exercise Plan Access Code: [place]    Recommended Other Services sent MD renewal    Consulted and Agree with Plan of Care Patient             Patient will benefit from skilled therapeutic intervention in order to improve the following deficits and impairments:  Decreased coordination, Increased fascial restricitons, Decreased endurance, Decreased activity tolerance, Decreased strength  Visit Diagnosis: Muscle weakness (generalized) - Plan: PT plan of care cert/re-cert  Other lack of coordination - Plan: PT plan of care cert/re-cert     Problem List Patient Active Problem List   Diagnosis Date Noted   Adverse effect of other drugs, medicaments and biological substances, subsequent encounter 05/23/2021   Other allergic rhinitis 05/23/2021   Allergic conjunctivitis of both eyes 05/23/2021  History of asthma 05/23/2021   Oral allergy syndrome, subsequent encounter 05/23/2021   Family history of bicuspid aortic valve 11/28/2020   Other hyperlipidemia 11/28/2020   Chest pain of uncertain etiology 20/72/1828   Thoracic aortic aneurysm without rupture 10/30/2020   PAC (premature atrial contraction) 10/30/2020   Postoperative bile leak 04/12/2016   Acute hypokalemia 04/12/2016   Paroxysmal SVT (supraventricular tachycardia) (HCC) 04/12/2016   Bile leak, postoperative 04/12/2016   S/P laparoscopic cholecystectomy    History of Gallstone pancreatitis 04/07/2016   Pancreatitis due to biliary obstruction 04/07/2016   Cancer ([place])    Osteopenia     Earlie Counts, PT 09/21/21 12:22 PM  [facility] @ [place], [place], [address] Phone: [phone]    Fax:  [phone]  Name: Laurie Terry MRN: [id number] Date of Birth: 1965/12/05

## 2021-10-05 ENCOUNTER — Encounter: Payer: Self-pay | Admitting: Internal Medicine

## 2021-10-19 ENCOUNTER — Other Ambulatory Visit: Payer: Self-pay

## 2021-10-19 ENCOUNTER — Encounter: Payer: Self-pay | Admitting: Physical Therapy

## 2021-10-19 ENCOUNTER — Ambulatory Visit: Payer: 59 | Attending: Family Medicine | Admitting: Physical Therapy

## 2021-10-19 DIAGNOSIS — M6281 Muscle weakness (generalized): Secondary | ICD-10-CM | POA: Insufficient documentation

## 2021-10-19 DIAGNOSIS — R278 Other lack of coordination: Secondary | ICD-10-CM | POA: Diagnosis present

## 2021-10-19 NOTE — Therapy (Signed)
[facility] @ [place], [place], [address] Phone: [phone]   Fax:  [phone]  Physical Therapy Treatment  Patient Details  Name: Laurie Terry MRN: [id number] Date of Birth: [birth year] Referring Provider (PT): Dr. Aundria Rud   Encounter Date: 10/19/2021   PT End of Session - 10/19/21 0843     Visit Number 6    Date for PT Re-Evaluation 11/26/21    Authorization Type UHC    Authorization - Visit Number 6    Authorization - Number of Visits 60    PT Start Time 0800    PT Stop Time 0838    PT Time Calculation (min) 38 min    Activity Tolerance Patient tolerated treatment well    Behavior During Therapy [facility] for tasks assessed/performed             Past Medical History:  Diagnosis Date   Allergic rhinitis    Aortic aneurysm ([place]) 09/23/2020   Cancer ([place])    Melanoma   Cervical dysplasia 2007   low-grade SIL, normal Pap smears since then   Family history of adverse reaction to anesthesia    mom has h/o post op nausea/vomiting   Intermittent palpitations    IUD    inserted 03/2002.04/06/2007, 06/03/2012   Ocular migraine    Osteomalacia    Pancreatitis    Seafood allergy     Past Surgical History:  Procedure Laterality Date   BUNIONECTOMY  2018   CHOLECYSTECTOMY N/A 04/09/2016   Procedure: LAPAROSCOPIC CHOLECYSTECTOMY ;  Surgeon: Rolm Bookbinder, MD;  Location: [facility];  Service: General;  Laterality: N/A;   ERCP  04/11/2016   ERCP with pancreatic stent and failed CBD stent after needle knife and conventional sphincterotomy   ERCP N/A 04/12/2016   Procedure: ENDOSCOPIC RETROGRADE CHOLANGIOPANCREATOGRAPHY (ERCP);  Surgeon: Clarene Essex, MD;  Location: [facility] ENDOSCOPY;  Service: Endoscopy;  Laterality: N/A;   FOOT SURGERY     toenail   INTRAUTERINE DEVICE INSERTION  08/07/2017   Mirena   removal of melanoma      There were no vitals filed for this visit.   Subjective Assessment  - 10/19/21 0806     Subjective I do not feel like I am doing any better.    Patient Stated Goals stop the fecal and urinary leakage    Currently in Pain? No/denies    Multiple Pain Sites No                               [facility] Adult PT Treatment/Exercise - 10/19/21 0001       Self-Care   Self-Care Other Self-Care Comments    Other Self-Care Comments  educated pateint o personal pelvic floro muscle trainers with biofeedback or electrical stimulation.      Lumbar Exercises: Standing   Other Standing Lumbar Exercises dead lift with black band and tactile cues toincorporate the pelvic floor    Other Standing Lumbar Exercises standing squat with ball squeeze to further contract the pelvic floor                     PT Education - 10/19/21 0842     Education Details Access Code: [place]  ; educated patient on devices that help the pelvic floor contract    Person(s) Educated Patient    Methods Explanation;Demonstration;Verbal cues;Handout    Comprehension Returned demonstration;Verbalized understanding  PT Short Term Goals - 09/21/21 1220       PT SHORT TERM GOAL #1   Title independent with initial HEP for pelvic floor strength    Time 4    Period Weeks    Status Achieved    Target Date 06/29/21      PT SHORT TERM GOAL #2   Title educated on vaginal moisturizers and lubricants for good vaginal health    Time 4    Period Weeks    Status Achieved      PT SHORT TERM GOAL #3   Title able to feel the stool come out of the anus due to improved anal sensation    Time 4    Period Weeks    Status Achieved               PT Long Term Goals - 10/19/21 0847       PT LONG TERM GOAL #1   Title independent with advanced HEP for core and pelvic floor strength    Time 4    Period Months    Status On-going    Target Date 10/01/21      PT LONG TERM GOAL #2   Title able to hold her urine as she is walking to the commode due to  improve pelvic floor strength >/= 3/5    Time 4    Period Months    Status On-going      PT LONG TERM GOAL #3   Title fecal leakage decreased >/= 80% due to improved puborectalis moving forward and strength >/= 3/5    Time 4    Period Months    Status On-going    Target Date 10/01/21      PT LONG TERM GOAL #4   Title able to contract the pelvic floor with lower abdominal engagement and not lifting rib cage due to improve strenth and reduce strain on the bladder    Time 4    Period Months    Status On-going    Target Date 10/01/21                   Plan - 10/19/21 0844     Clinical Impression Statement Patient has not been as consistent with her exercises due to vacation and the holidays. In therapy we isolated exercises that facilitate her walking to the bathroom and sitting on the commode. We went over how to engage the pelvic floor and lower abdominals with these exercises. Patient has learned different devices that can assist in contracting the pelvic floor. Patient will benefit from skilled therapy to improve pelvic floor coordination to reduce her leakage and increase  her core and hip strength.    Personal Factors and Comorbidities Comorbidity 2;Sex    Comorbidities Melanoma; osteomalencia; 4 vaginal birth of babies averaging 9 pounds.    Examination-Activity Limitations Continence;Toileting;Locomotion Level    Examination-Participation Restrictions Community Activity;Interpersonal Relationship    Stability/Clinical Decision Making Stable/Uncomplicated    Rehab Potential Excellent    PT Frequency 1x / week   every 3-4 weeks   PT Duration 8 weeks    PT Treatment/Interventions ADLs/Self Care Home Management;Biofeedback;Therapeutic activities;Therapeutic exercise;Neuromuscular re-education;Patient/family education;Manual techniques;Taping;Spinal Manipulations    PT Next Visit Plan see how the exercises are doing and change if needed; see if she got a device for pelvic  floor contraction    PT Home Exercise Plan Access Code: [place]    Recommended Other Services MD signed initial note and  renewal note    Consulted and Agree with Plan of Care Patient             Patient will benefit from skilled therapeutic intervention in order to improve the following deficits and impairments:  Decreased coordination, Increased fascial restricitons, Decreased endurance, Decreased activity tolerance, Decreased strength  Visit Diagnosis: Muscle weakness (generalized)  Other lack of coordination     Problem List Patient Active Problem List   Diagnosis Date Noted   Adverse effect of other drugs, medicaments and biological substances, subsequent encounter 05/23/2021   Other allergic rhinitis 05/23/2021   Allergic conjunctivitis of both eyes 05/23/2021   History of asthma 05/23/2021   Oral allergy syndrome, subsequent encounter 05/23/2021   Family history of bicuspid aortic valve 11/28/2020   Other hyperlipidemia 11/28/2020   Chest pain of uncertain etiology 16/57/9038   Thoracic aortic aneurysm without rupture 10/30/2020   PAC (premature atrial contraction) 10/30/2020   Postoperative bile leak 04/12/2016   Acute hypokalemia 04/12/2016   Paroxysmal SVT (supraventricular tachycardia) ([place]) 04/12/2016   Bile leak, postoperative 04/12/2016   S/P laparoscopic cholecystectomy    History of Gallstone pancreatitis 04/07/2016   Pancreatitis due to biliary obstruction 04/07/2016   Cancer ([place])    Osteopenia     Earlie Counts, PT 10/19/21 8:49 AM   [facility] @ [place], [place], [address] Phone: [phone]   Fax:  [phone]  Name: Laurie Terry MRN: [id number] Date of Birth: [birth year]

## 2021-10-19 NOTE — Patient Instructions (Signed)
Access Code: [email or web address] URL: [email or web address] Date: 10/19/2021 Prepared by: Earlie Counts  Exercises Supine Pelvic Floor Contraction - 2 x daily - 7 x weekly - 1 sets - 10 reps - 10 sec hold Seated Piriformis Stretch with Trunk Bend - 1 x daily - 7 x weekly - 1 sets - 2 reps - 30 sec hold Supine March - 1 x daily - 3 x weekly - 1 sets - 10 reps Beginner Front Arm Support - 1 x daily - 3 x weekly - 1 sets - 10 reps Half Kneeling Anti-Rotation Press - Forward Leg Opposite Anchor Side - 1 x daily - 1 x weekly - 1 sets - 10 reps Deadlift with Resistance - 1 x daily - 3 x weekly - 3 sets - 10 reps Standing March - 1 x daily - 3 x weekly - 3 sets - 10 reps Squat with Chair Touch - 1 x daily - 3 x weekly - 3 sets - 10 reps Seated Pelvic Floor Contraction - 3 x daily - 7 x weekly - 1 sets - 10 reps - 5-10 sec hold Quick Flick Pelvic Floor Contractions Seated - 3 x daily - 7 x weekly - 1 sets - 5 reps - 1 sec hold [facility] [address], [place], [place] [address] Phone # [phone] Fax [phone]

## 2021-10-24 MED ORDER — METOPROLOL TARTRATE 25 MG PO TABS: 25.0000 mg | ORAL_TABLET | Freq: Two times a day (BID) | ORAL | 3 refills | Status: DC

## 2021-10-31 ENCOUNTER — Ambulatory Visit (INDEPENDENT_AMBULATORY_CARE_PROVIDER_SITE_OTHER): Payer: 59

## 2021-10-31 ENCOUNTER — Telehealth: Payer: Self-pay | Admitting: Internal Medicine

## 2021-10-31 DIAGNOSIS — R002 Palpitations: Secondary | ICD-10-CM

## 2021-10-31 NOTE — Telephone Encounter (Signed)
Patient c/o Palpitations:  High priority if patient c/o lightheadedness, shortness of breath, or chest pain  How long have you had palpitations/irregular HR/ Afib? Are you having the symptoms now? 4 months or so, no  Are you currently experiencing lightheadedness, SOB or CP? no  Do you have a history of afib (atrial fibrillation) or irregular heart rhythm? yes  Have you checked your BP or HR? (document readings if available):  102/70   Are you experiencing any other symptoms? no   Patient states she has irregular heart beats and has been taking the metoprolol 2x a day, but it has not gone away. She says they are not as bad, but she is still having them. She says she would prefer to be doing better than she is on the medication. She states she sent a mychart message and was told to take the metoprolol 2x a day, but she says she has already been doing that. She says sometimes the irregular beats are small and sometimes they are longer.

## 2021-10-31 NOTE — Progress Notes (Unsigned)
Enrolled for [employer] to mail a ZIO XT long term holter monitor to the patients address on file.  

## 2021-10-31 NOTE — Telephone Encounter (Signed)
Called pt reviewed MD recommendation to wear a 7 day heart monitor.  Pt is agreeable to plan.  Reviewed instructions with pt.  Pt reports feels better since starting metoprolol however; continues to have funny heart beats.  Order placed advised pt to call in with any further questions or concerns.

## 2021-11-09 ENCOUNTER — Encounter: Payer: 59 | Admitting: Physical Therapy

## 2021-11-12 ENCOUNTER — Encounter: Payer: Self-pay | Admitting: Internal Medicine

## 2021-11-12 DIAGNOSIS — I77819 Aortic ectasia, unspecified site: Secondary | ICD-10-CM

## 2021-11-12 NOTE — Telephone Encounter (Addendum)
Called Urgent Care, stated the patient saw Nicki Reaper Long and she would reach out to him to finish/sign note so she can fax it over asap. Gave fax [phone] and to attention to triage.

## 2021-11-13 NOTE — Telephone Encounter (Signed)
Laurie Lean, MD  She had a personal history of Aortic Dilation and family history of Aortic dissection  We can order the Abdominal aortic duplex for her.  I'm an unclear what the urgent doctor found on exam in regard to this.   Placed order.

## 2021-11-14 ENCOUNTER — Other Ambulatory Visit: Payer: Self-pay

## 2021-11-14 ENCOUNTER — Ambulatory Visit (INDEPENDENT_AMBULATORY_CARE_PROVIDER_SITE_OTHER): Payer: 59

## 2021-11-14 DIAGNOSIS — I77810 Thoracic aortic ectasia: Secondary | ICD-10-CM

## 2021-11-14 DIAGNOSIS — I77819 Aortic ectasia, unspecified site: Secondary | ICD-10-CM

## 2021-11-23 NOTE — Telephone Encounter (Signed)
We received her exam report: 6 cm non tender mac central abdomen with a bruit.  This is not consistent with 11/14/21 abdominal duplex; while these study can overestimated disease it is rare for these studies to underestimate diameter. ?

## 2021-11-26 ENCOUNTER — Other Ambulatory Visit: Payer: Self-pay | Admitting: Family Medicine

## 2021-11-26 ENCOUNTER — Other Ambulatory Visit (HOSPITAL_BASED_OUTPATIENT_CLINIC_OR_DEPARTMENT_OTHER): Payer: Self-pay | Admitting: Family Medicine

## 2021-11-26 DIAGNOSIS — R1011 Right upper quadrant pain: Secondary | ICD-10-CM

## 2021-11-26 DIAGNOSIS — R7401 Elevation of levels of liver transaminase levels: Secondary | ICD-10-CM

## 2021-11-28 ENCOUNTER — Other Ambulatory Visit: Payer: Self-pay

## 2021-11-28 ENCOUNTER — Ambulatory Visit (HOSPITAL_COMMUNITY)
Admission: RE | Admit: 2021-11-28 | Discharge: 2021-11-28 | Disposition: A | Discharge status: Home / Self Care | Payer: 59 | Source: Ambulatory Visit | Attending: Family Medicine | Admitting: Family Medicine

## 2021-11-28 DIAGNOSIS — R7401 Elevation of levels of liver transaminase levels: Secondary | ICD-10-CM | POA: Insufficient documentation

## 2021-11-28 DIAGNOSIS — I712 Thoracic aortic aneurysm, without rupture, unspecified: Secondary | ICD-10-CM | POA: Insufficient documentation

## 2021-11-28 DIAGNOSIS — R1011 Right upper quadrant pain: Secondary | ICD-10-CM | POA: Diagnosis present

## 2021-11-29 ENCOUNTER — Ambulatory Visit (HOSPITAL_COMMUNITY)
Admission: RE | Admit: 2021-11-29 | Discharge: 2021-11-29 | Disposition: A | Discharge status: Home / Self Care | Payer: 59 | Source: Ambulatory Visit | Attending: Internal Medicine | Admitting: Internal Medicine

## 2021-11-29 DIAGNOSIS — I712 Thoracic aortic aneurysm, without rupture, unspecified: Secondary | ICD-10-CM

## 2021-11-29 MED ORDER — GADOBUTROL 1 MMOL/ML IV SOLN
7.0000 mL | Freq: Once | INTRAVENOUS | Status: AC | PRN
  Administered 2021-11-29: 16:00:00 7 mL via INTRAVENOUS

## 2021-11-30 ENCOUNTER — Encounter: Payer: Self-pay | Admitting: Physical Therapy

## 2021-12-06 ENCOUNTER — Other Ambulatory Visit: Payer: Self-pay | Admitting: Gastroenterology

## 2021-12-06 DIAGNOSIS — R002 Palpitations: Secondary | ICD-10-CM

## 2021-12-16 ENCOUNTER — Ambulatory Visit
Admission: RE | Admit: 2021-12-16 | Discharge: 2021-12-16 | Disposition: A | Discharge status: Home / Self Care | Payer: 59 | Source: Ambulatory Visit | Attending: Gastroenterology | Admitting: Gastroenterology

## 2021-12-16 ENCOUNTER — Other Ambulatory Visit: Payer: Self-pay

## 2021-12-16 DIAGNOSIS — R748 Abnormal levels of other serum enzymes: Secondary | ICD-10-CM

## 2021-12-16 MED ORDER — GADOBENATE DIMEGLUMINE 529 MG/ML IV SOLN
14.0000 mL | Freq: Once | INTRAVENOUS | Status: AC | PRN
  Administered 2021-12-16: 14 mL via INTRAVENOUS

## 2021-12-27 ENCOUNTER — Telehealth: Payer: Self-pay | Admitting: *Deleted

## 2021-12-27 NOTE — Telephone Encounter (Signed)
Spoke with pt, she reports that she is feeling better now and does not want to increase the metoprolol. She is going to stay on the 25 mg twice daily for now and if her symptoms worsen again, she may increase. She is going to discuss more at her follow up appointment. Will make dr Gasper Sells aware ?

## 2021-12-27 NOTE — Telephone Encounter (Signed)
-----   Message from Werner Lean, MD sent at 12/27/2021  4:12 PM EDT ----- ?Results: ?Paroxsymal SVT with occasional symptoms  ?Plan: ?Patient mentioned breath through symptoms on metoprolol 25 mg PO BID; heart rate 74. ?Will increase to to 37.5 mg PO BID ? ?Werner Lean, MD ? ?

## 2022-01-02 ENCOUNTER — Encounter: Payer: Self-pay | Admitting: Physical Therapy

## 2022-01-10 ENCOUNTER — Other Ambulatory Visit: Payer: Self-pay | Admitting: Gastroenterology

## 2022-01-15 ENCOUNTER — Inpatient Hospital Stay (HOSPITAL_COMMUNITY)
Admission: EM | Admit: 2022-01-15 | Discharge: 2022-01-26 | DRG: 439 | Disposition: A | Discharge status: Home / Self Care | Payer: 59 | Attending: Internal Medicine | Admitting: Internal Medicine

## 2022-01-15 ENCOUNTER — Encounter (HOSPITAL_COMMUNITY): Payer: Self-pay

## 2022-01-15 ENCOUNTER — Ambulatory Visit (HOSPITAL_COMMUNITY): Payer: 59 | Admitting: Anesthesiology

## 2022-01-15 ENCOUNTER — Ambulatory Visit (HOSPITAL_COMMUNITY): Payer: 59

## 2022-01-15 ENCOUNTER — Ambulatory Visit (HOSPITAL_COMMUNITY)
Admission: AD | Admit: 2022-01-15 | Discharge: 2022-01-15 | Disposition: A | Discharge status: Home / Self Care | Payer: 59 | Source: Ambulatory Visit | Attending: Gastroenterology | Admitting: Gastroenterology

## 2022-01-15 ENCOUNTER — Ambulatory Visit (HOSPITAL_BASED_OUTPATIENT_CLINIC_OR_DEPARTMENT_OTHER): Payer: 59 | Admitting: Anesthesiology

## 2022-01-15 ENCOUNTER — Other Ambulatory Visit: Payer: Self-pay

## 2022-01-15 ENCOUNTER — Emergency Department (HOSPITAL_COMMUNITY): Payer: 59

## 2022-01-15 ENCOUNTER — Encounter (HOSPITAL_COMMUNITY)
Admission: AD | Disposition: A | Discharge status: Home / Self Care | Payer: Self-pay | Source: Ambulatory Visit | Attending: Gastroenterology

## 2022-01-15 ENCOUNTER — Encounter (HOSPITAL_COMMUNITY): Payer: Self-pay | Admitting: Gastroenterology

## 2022-01-15 DIAGNOSIS — Z87891 Personal history of nicotine dependence: Secondary | ICD-10-CM | POA: Insufficient documentation

## 2022-01-15 DIAGNOSIS — Z888 Allergy status to other drugs, medicaments and biological substances status: Secondary | ICD-10-CM

## 2022-01-15 DIAGNOSIS — Z8261 Family history of arthritis: Secondary | ICD-10-CM

## 2022-01-15 DIAGNOSIS — K838 Other specified diseases of biliary tract: Secondary | ICD-10-CM

## 2022-01-15 DIAGNOSIS — Z803 Family history of malignant neoplasm of breast: Secondary | ICD-10-CM

## 2022-01-15 DIAGNOSIS — K219 Gastro-esophageal reflux disease without esophagitis: Secondary | ICD-10-CM | POA: Diagnosis present

## 2022-01-15 DIAGNOSIS — K859 Acute pancreatitis without necrosis or infection, unspecified: Principal | ICD-10-CM | POA: Diagnosis present

## 2022-01-15 DIAGNOSIS — K8050 Calculus of bile duct without cholangitis or cholecystitis without obstruction: Secondary | ICD-10-CM | POA: Diagnosis present

## 2022-01-15 DIAGNOSIS — Z79899 Other long term (current) drug therapy: Secondary | ICD-10-CM

## 2022-01-15 DIAGNOSIS — K648 Other hemorrhoids: Secondary | ICD-10-CM | POA: Diagnosis present

## 2022-01-15 DIAGNOSIS — R1013 Epigastric pain: Secondary | ICD-10-CM | POA: Diagnosis not present

## 2022-01-15 DIAGNOSIS — K8071 Calculus of gallbladder and bile duct without cholecystitis with obstruction: Secondary | ICD-10-CM | POA: Diagnosis present

## 2022-01-15 DIAGNOSIS — Z9049 Acquired absence of other specified parts of digestive tract: Secondary | ICD-10-CM

## 2022-01-15 DIAGNOSIS — E876 Hypokalemia: Secondary | ICD-10-CM | POA: Diagnosis present

## 2022-01-15 DIAGNOSIS — T3695XA Adverse effect of unspecified systemic antibiotic, initial encounter: Secondary | ICD-10-CM | POA: Diagnosis present

## 2022-01-15 DIAGNOSIS — I712 Thoracic aortic aneurysm, without rupture, unspecified: Secondary | ICD-10-CM | POA: Diagnosis present

## 2022-01-15 DIAGNOSIS — Z823 Family history of stroke: Secondary | ICD-10-CM

## 2022-01-15 DIAGNOSIS — Z91013 Allergy to seafood: Secondary | ICD-10-CM

## 2022-01-15 DIAGNOSIS — Z881 Allergy status to other antibiotic agents status: Secondary | ICD-10-CM

## 2022-01-15 DIAGNOSIS — K9189 Other postprocedural complications and disorders of digestive system: Secondary | ICD-10-CM | POA: Diagnosis present

## 2022-01-15 DIAGNOSIS — Z91041 Radiographic dye allergy status: Secondary | ICD-10-CM

## 2022-01-15 DIAGNOSIS — J45909 Unspecified asthma, uncomplicated: Secondary | ICD-10-CM | POA: Diagnosis present

## 2022-01-15 DIAGNOSIS — K521 Toxic gastroenteritis and colitis: Secondary | ICD-10-CM | POA: Diagnosis present

## 2022-01-15 DIAGNOSIS — I471 Supraventricular tachycardia, unspecified: Secondary | ICD-10-CM | POA: Diagnosis present

## 2022-01-15 DIAGNOSIS — K851 Biliary acute pancreatitis without necrosis or infection: Secondary | ICD-10-CM | POA: Diagnosis present

## 2022-01-15 DIAGNOSIS — K621 Rectal polyp: Secondary | ICD-10-CM | POA: Diagnosis present

## 2022-01-15 DIAGNOSIS — Z20822 Contact with and (suspected) exposure to covid-19: Secondary | ICD-10-CM | POA: Diagnosis present

## 2022-01-15 DIAGNOSIS — J90 Pleural effusion, not elsewhere classified: Secondary | ICD-10-CM

## 2022-01-15 DIAGNOSIS — Y836 Removal of other organ (partial) (total) as the cause of abnormal reaction of the patient, or of later complication, without mention of misadventure at the time of the procedure: Secondary | ICD-10-CM | POA: Diagnosis present

## 2022-01-15 DIAGNOSIS — Z91018 Allergy to other foods: Secondary | ICD-10-CM

## 2022-01-15 DIAGNOSIS — R509 Fever, unspecified: Secondary | ICD-10-CM

## 2022-01-15 DIAGNOSIS — Z85820 Personal history of malignant melanoma of skin: Secondary | ICD-10-CM

## 2022-01-15 DIAGNOSIS — Z882 Allergy status to sulfonamides status: Secondary | ICD-10-CM

## 2022-01-15 DIAGNOSIS — Z880 Allergy status to penicillin: Secondary | ICD-10-CM

## 2022-01-15 DIAGNOSIS — Z8349 Family history of other endocrine, nutritional and metabolic diseases: Secondary | ICD-10-CM

## 2022-01-15 HISTORY — PX: ERCP: SHX5425

## 2022-01-15 HISTORY — PX: SPHINCTEROTOMY: SHX5544

## 2022-01-15 HISTORY — PX: REMOVAL OF STONES: SHX5545

## 2022-01-15 LAB — COMPREHENSIVE METABOLIC PANEL
ALT: 23 U/L (ref 0–44)
AST: 27 U/L (ref 15–41)
Albumin: 4.3 g/dL (ref 3.5–5.0)
Alkaline Phosphatase: 62 U/L (ref 38–126)
Anion gap: 7 (ref 5–15)
BUN: 16 mg/dL (ref 6–20)
CO2: 27 mmol/L (ref 22–32)
Calcium: 9 mg/dL (ref 8.9–10.3)
Chloride: 102 mmol/L (ref 98–111)
Creatinine, Ser: 0.71 mg/dL (ref 0.44–1.00)
GFR, Estimated: >60 mL/min (ref >60)
Glucose, Bld: 163 mg/dL — ABNORMAL HIGH (ref 70–99)
Potassium: 3.9 mmol/L (ref 3.5–5.1)
Sodium: 136 mmol/L (ref 135–145)
Total Bilirubin: 0.6 mg/dL (ref 0.3–1.2)
Total Protein: 7.2 g/dL (ref 6.5–8.1)

## 2022-01-15 LAB — URINALYSIS, ROUTINE W REFLEX MICROSCOPIC
Bilirubin Urine: NEGATIVE
Glucose, UA: NEGATIVE mg/dL
Hgb urine dipstick: NEGATIVE
Ketones, ur: 80 mg/dL — AB
Leukocytes,Ua: NEGATIVE
Nitrite: NEGATIVE
Protein, ur: NEGATIVE mg/dL
Specific Gravity, Urine: 1.026 (ref 1.005–1.030)
pH: 5 (ref 5.0–8.0)

## 2022-01-15 LAB — CBC
HCT: 42.5 % (ref 36.0–46.0)
Hemoglobin: 14 g/dL (ref 12.0–15.0)
MCH: 29.5 pg (ref 26.0–34.0)
MCHC: 32.9 g/dL (ref 30.0–36.0)
MCV: 89.7 fL (ref 80.0–100.0)
Platelets: 265 10*3/uL (ref 150–400)
RBC: 4.74 MIL/uL (ref 3.87–5.11)
RDW: 13.2 % (ref 11.5–15.5)
WBC: 7.4 10*3/uL (ref 4.0–10.5)
nRBC: 0 % (ref 0.0–0.2)

## 2022-01-15 LAB — LIPASE, BLOOD: Lipase: 40 U/L (ref 11–51)

## 2022-01-15 SURGERY — ERCP, WITH INTERVENTION IF INDICATED
Anesthesia: General

## 2022-01-15 MED ORDER — FENTANYL CITRATE PF 50 MCG/ML IJ SOSY
50.0000 ug | PREFILLED_SYRINGE | Freq: Once | INTRAMUSCULAR | Status: AC
  Administered 2022-01-15: 50 ug via INTRAVENOUS
  Filled 2022-01-15: qty 1

## 2022-01-15 MED ORDER — SODIUM CHLORIDE 0.9 % IV BOLUS
1000.0000 mL | Freq: Once | INTRAVENOUS | Status: AC
  Administered 2022-01-15: 1000 mL via INTRAVENOUS

## 2022-01-15 MED ORDER — ONDANSETRON HCL 4 MG/2ML IJ SOLN
4.0000 mg | Freq: Once | INTRAMUSCULAR | Status: AC
  Administered 2022-01-15: 4 mg via INTRAVENOUS
  Filled 2022-01-15: qty 2

## 2022-01-15 MED ORDER — ROCURONIUM BROMIDE 10 MG/ML (PF) SYRINGE
PREFILLED_SYRINGE | INTRAVENOUS | Status: DC | PRN
  Administered 2022-01-15: 50 mg via INTRAVENOUS

## 2022-01-15 MED ORDER — MIDAZOLAM HCL 2 MG/2ML IJ SOLN
INTRAMUSCULAR | Status: AC
  Filled 2022-01-15: qty 2

## 2022-01-15 MED ORDER — PROPOFOL 10 MG/ML IV BOLUS
INTRAVENOUS | Status: AC
  Filled 2022-01-15: qty 20

## 2022-01-15 MED ORDER — LIDOCAINE 2% (20 MG/ML) 5 ML SYRINGE
INTRAMUSCULAR | Status: DC | PRN
  Administered 2022-01-15: 60 mg via INTRAVENOUS

## 2022-01-15 MED ORDER — SODIUM CHLORIDE 0.9 % IV SOLN: INTRAVENOUS | Status: DC

## 2022-01-15 MED ORDER — OXYCODONE HCL 5 MG/5ML PO SOLN: 5.0000 mg | Freq: Once | ORAL | Status: DC | PRN

## 2022-01-15 MED ORDER — LACTATED RINGERS IV SOLN
INTRAVENOUS | Status: DC
Start: 2022-01-15 — End: 2022-01-15

## 2022-01-15 MED ORDER — CEFAZOLIN SODIUM-DEXTROSE 2-4 GM/100ML-% IV SOLN
2.0000 g | INTRAVENOUS | Status: AC
  Administered 2022-01-15: 2 g via INTRAVENOUS
  Filled 2022-01-15: qty 100

## 2022-01-15 MED ORDER — SUGAMMADEX SODIUM 200 MG/2ML IV SOLN
INTRAVENOUS | Status: DC | PRN
  Administered 2022-01-15: 200 mg via INTRAVENOUS

## 2022-01-15 MED ORDER — PROPOFOL 10 MG/ML IV BOLUS
INTRAVENOUS | Status: DC | PRN
  Administered 2022-01-15: 30 mg via INTRAVENOUS
  Administered 2022-01-15: 140 mg via INTRAVENOUS

## 2022-01-15 MED ORDER — MIDAZOLAM HCL 5 MG/5ML IJ SOLN
INTRAMUSCULAR | Status: DC | PRN
Start: 2022-01-15 — End: 2022-01-15
  Administered 2022-01-15: 2 mg via INTRAVENOUS

## 2022-01-15 MED ORDER — DICLOFENAC SUPPOSITORY 100 MG
RECTAL | Status: AC
  Filled 2022-01-15: qty 1

## 2022-01-15 MED ORDER — FENTANYL CITRATE (PF) 100 MCG/2ML IJ SOLN
INTRAMUSCULAR | Status: AC
  Filled 2022-01-15: qty 2

## 2022-01-15 MED ORDER — OXYCODONE HCL 5 MG PO TABS: 5.0000 mg | ORAL_TABLET | Freq: Once | ORAL | Status: DC | PRN

## 2022-01-15 MED ORDER — FENTANYL CITRATE (PF) 100 MCG/2ML IJ SOLN
INTRAMUSCULAR | Status: DC | PRN
  Administered 2022-01-15 (×2): 50 ug via INTRAVENOUS

## 2022-01-15 MED ORDER — ONDANSETRON HCL 4 MG/2ML IJ SOLN
INTRAMUSCULAR | Status: DC | PRN
  Administered 2022-01-15: 4 mg via INTRAVENOUS

## 2022-01-15 MED ORDER — DEXAMETHASONE SODIUM PHOSPHATE 10 MG/ML IJ SOLN
INTRAMUSCULAR | Status: DC | PRN
  Administered 2022-01-15: 10 mg via INTRAVENOUS

## 2022-01-15 MED ORDER — FENTANYL CITRATE (PF) 100 MCG/2ML IJ SOLN: 25.0000 ug | INTRAMUSCULAR | Status: DC | PRN

## 2022-01-15 NOTE — Anesthesia Preprocedure Evaluation (Addendum)
Anesthesia Evaluation  ?Patient identified by MRN, date of birth, ID band ?Patient awake ? ? ? ?Reviewed: ?Allergy & Precautions, NPO status , Patient's Chart, lab work & pertinent test results ? ?History of Anesthesia Complications ?Negative for: history of anesthetic complications ? ?Airway ?Mallampati: II ? ?TM Distance: >3 FB ?Neck ROM: Full ? ? ? Dental ?no notable dental hx. ?(+) Missing,  ?  ?Pulmonary ?former smoker,  ?  ?Pulmonary exam normal ? ? ? ? ? ? ? Cardiovascular ?Normal cardiovascular exam+ dysrhythmias Supra Ventricular Tachycardia  ? ? ?  ?Neuro/Psych ? Headaches, negative psych ROS  ? GI/Hepatic ?Neg liver ROS, CBD stone ?  ?Endo/Other  ?negative endocrine ROS ? Renal/GU ?negative Renal ROS  ?negative genitourinary ?  ?Musculoskeletal ?negative musculoskeletal ROS ?(+)  ? Abdominal ?  ?Peds ? Hematology ?negative hematology ROS ?(+)   ?Anesthesia Other Findings ?Day of surgery medications reviewed with patient. ? Reproductive/Obstetrics ?negative OB ROS ? ?  ? ? ? ? ? ? ? ? ? ? ? ? ? ?  ?  ? ? ? ? ? ? ? ?Anesthesia Physical ?Anesthesia Plan ? ?ASA: 2 ? ?Anesthesia Plan: General  ? ?Post-op Pain Management: Minimal or no pain anticipated  ? ?Induction: Intravenous ? ?PONV Risk Score and Plan: 3 and Treatment may vary due to age or medical condition, Ondansetron, Dexamethasone and Midazolam ? ?Airway Management Planned: Oral ETT ? ?Additional Equipment: None ? ?Intra-op Plan:  ? ?Post-operative Plan: Extubation in OR ? ?Informed Consent: I have reviewed the patients History and Physical, chart, labs and discussed the procedure including the risks, benefits and alternatives for the proposed anesthesia with the patient or authorized representative who has indicated his/her understanding and acceptance.  ? ? ? ?Dental advisory given ? ?Plan Discussed with: CRNA ? ?Anesthesia Plan Comments:   ? ? ? ? ? ?Anesthesia Quick Evaluation ? ?

## 2022-01-15 NOTE — ED Triage Notes (Signed)
Pt reports with upper abdominal pain and nausea after having an ERCP done today to remove a stone from her bile duct. Pt states that she has had pancreatitis and it feels like this.  ?

## 2022-01-15 NOTE — Progress Notes (Signed)
Laurie Terry ?12:20 PM ? ?Subjective: ?Patient with mild right upper quadrant pain but no new symptoms since we recently saw her we rediscussed the procedure and answered all of her questions ? ?Objective: ?Vital signs stable afebrile no acute distress exam please see preassessment evaluation ? ?Assessment: ?CBD stone on EUS ? ?Plan: ?Okay to proceed with ERCP and hopefully stone extraction with anesthesia assistance and the procedure including stenting and procedure success was rediscussed with the patient ? ?[facility] ? ?office [phone] ?After 5PM or if no answer call [phone]  ?

## 2022-01-15 NOTE — Discharge Instructions (Addendum)
Clear liquids only until 7 PM and if doing well may have soft solids this evening otherwise slowly advance diet tomorrow and do not use any aspirin or arthritis pills for 5 days and please use Tylenol only and call if any GI question or problem and follow-up as needed ? ? ?YOU HAD AN ENDOSCOPIC PROCEDURE TODAY: Refer to the procedure report and other information in the discharge instructions given to you for any specific questions about what was found during the examination. If this information does not answer your questions, please call [facility] office at [phone] to clarify.  ? ?YOU SHOULD EXPECT: Some feelings of bloating in the abdomen. Passage of more gas than usual. Walking can help get rid of the air that was put into your GI tract during the procedure and reduce the bloating. If you had a lower endoscopy (such as a colonoscopy or flexible sigmoidoscopy) you may notice spotting of blood in your stool or on the toilet paper. Some abdominal soreness may be present for a day or two, also. ? ?DIET: Your first meal following the procedure should be a light meal and then it is ok to progress to your normal diet. A half-sandwich or bowl of soup is an example of a good first meal. Heavy or fried foods are harder to digest and may make you feel nauseous or bloated. Drink plenty of fluids but you should avoid alcoholic beverages for 24 hours. If you had a esophageal dilation, please see attached instructions for diet.   ? ?ACTIVITY: Your care partner should take you home directly after the procedure. You should plan to take it easy, moving slowly for the rest of the day. You can resume normal activity the day after the procedure however YOU SHOULD NOT DRIVE, use power tools, machinery or perform tasks that involve climbing or major physical exertion for 24 hours (because of the sedation medicines used during the test).  ? ?SYMPTOMS TO REPORT IMMEDIATELY: ?A gastroenterologist can be reached at any hour. Please call  [phone]  for any of the following symptoms:  ?Following upper endoscopy (EGD, EUS, ERCP, esophageal dilation) ?Vomiting of blood or coffee ground material  ?New, significant abdominal pain  ?New, significant chest pain or pain under the shoulder blades  ?Painful or persistently difficult swallowing  ?New shortness of breath  ?Black, tarry-looking or red, bloody stools ? ?FOLLOW UP:  ?If any biopsies were taken you will be contacted by phone or by letter within the next 1-3 weeks. Call [phone]  if you have not heard about the biopsies in 3 weeks.  ?Please also call with any specific questions about appointments or follow up tests.  ?

## 2022-01-15 NOTE — ED Provider Notes (Signed)
?[facility] DEPT ?Provider Note ? ? ?CSN: [id number] ?Arrival date & time: 01/15/22  1907 ? ?  ? ?History ? ?Chief Complaint  ?Patient presents with  ? Abdominal Pain  ? Nausea  ? ? ?Laurie Terry is a 56 y.o. female with a history of pancreatitis, cholecystectomy, asthma, and thoracic aortic aneurysm who presents to the ED with complaints of abdominal pain that began this evening. Patient states she was discharged from the hospital S/p ERCP earlier today around 3-3:30PM and was feeling okay at that time. A few hours after getting home she developed epigastric abdominal pain that radiates to the back which feels like her prior pancreatitis pain, as well as some lower abdominal pain. She is having associated nausea with some sweats. Called her doctor and was directed to come to the ED. Denies fever at home, vomiting, diarrhea, dysuria, chest pain or dyspnea. No BM since procedure but has passed gas.  ? ? ?HPI ? ?  ? ?Home Medications ?Prior to Admission medications   ?Medication Sig Start Date End Date Taking? Authorizing Provider  ?Adapalene 0.3 % gel Apply 1 application. topically 3 (three) times a week.  01/26/22  [provider]  ?albuterol (PROAIR HFA) 108 (90 Base) MCG/ACT inhaler Inhale 2 puffs into the lungs every 4 (four) hours as needed for wheezing or shortness of breath (coughing fits). 05/23/21   Garnet Sierras, DO  ?eletriptan (RELPAX) 40 MG tablet 40 mg every 2 (two) hours as needed for migraine. 05/18/20   [provider]  ?EPINEPHrine 0.3 mg/0.3 mL IJ SOAJ injection Inject 0.3 mg into the muscle as needed for anaphylaxis.    [provider]  ?famotidine (PEPCID) 20 MG tablet Take 20 mg by mouth daily as needed for heartburn or indigestion.    [provider]  ?fluticasone (FLONASE) 50 MCG/ACT nasal spray 2 sprays 2 (two) times daily as needed for allergies. 07/14/20   [provider]  ?ketotifen (ZADITOR) 0.025 % ophthalmic  solution 1 drop daily as needed (Itching eye and allergies).    [provider]  ?levocetirizine (XYZAL) 5 MG tablet Take 1 tablet (5 mg total) by mouth every evening. ?Patient not taking: Reported on 01/10/2022 05/23/21   Garnet Sierras, DO  ?metoprolol tartrate (LOPRESSOR) 25 MG tablet Take 1 tablet (25 mg total) by mouth 2 (two) times daily. ?Patient taking differently: Take 25 mg by mouth daily as needed (svt). 10/24/21   Werner Lean, MD  ?   ? ?Allergies    ?Contrast media [iodinated contrast media], Iodine, Other, Ampicillin, Avocado, Levofloxacin, Pantoprazole, Septra [bactrim], and Sulfamethoxazole-trimethoprim   ? ?Review of Systems   ?Review of Systems  ?Constitutional:  Positive for chills. Negative for fever.  ?Respiratory:  Negative for shortness of breath.   ?Cardiovascular:  Negative for chest pain.  ?Gastrointestinal:  Positive for abdominal pain and nausea. Negative for diarrhea and vomiting.  ?Genitourinary:  Negative for dysuria.  ?Neurological:  Negative for syncope.  ?All other systems reviewed and are negative. ? ?Physical Exam ?Updated Vital Signs ?BP (!) 104/43 (BP Location: Left Arm)   Pulse 68   Temp 98.3 ?F (36.8 ?C) (Oral)   Resp 18   Ht '5\' 4"'$  (1.626 m)   Wt 67.6 kg   SpO2 100%   BMI 25.58 kg/m?  ?Physical Exam ?Vitals and nursing note reviewed.  ?Constitutional:   ?   General: She is not in acute distress. ?   Appearance: She is well-developed. She  is not toxic-appearing.  ?HENT:  ?   Head: Normocephalic and atraumatic.  ?Eyes:  ?   General:     ?   Right eye: No discharge.     ?   Left eye: No discharge.  ?   Conjunctiva/sclera: Conjunctivae normal.  ?Cardiovascular:  ?   Rate and Rhythm: Normal rate and regular rhythm.  ?Pulmonary:  ?   Effort: No respiratory distress.  ?   Breath sounds: Normal breath sounds. No wheezing or rales.  ?Abdominal:  ?   General: There is no distension.  ?   Palpations: Abdomen is soft.  ?   Tenderness: There is abdominal tenderness in  the right upper quadrant, right lower quadrant, epigastric area and periumbilical area.  ?Musculoskeletal:  ?   Cervical back: Neck supple.  ?Skin: ?   General: Skin is warm and dry.  ?Neurological:  ?   Mental Status: She is alert.  ?   Comments: Clear speech.   ?Psychiatric:     ?   Behavior: Behavior normal.  ? ? ?ED Results / Procedures / Treatments   ?Labs ?(all labs ordered are listed, but only abnormal results are displayed) ?Labs Reviewed  ?COMPREHENSIVE METABOLIC PANEL - Abnormal; Notable for the following components:  ?    Result Value  ? Glucose, Bld 163 (*)   ? All other components within normal limits  ?LIPASE, BLOOD  ?CBC  ?URINALYSIS, ROUTINE W REFLEX MICROSCOPIC  ? ? ?EKG ?None ? ?Radiology ?DG ERCP ? ?Result Date: 01/15/2022 ?CLINICAL DATA:  56 year old female undergoing ERCP, history of abdominal pain. EXAM: ERCP TECHNIQUE: Multiple spot images obtained with the fluoroscopic device and submitted for interpretation post-procedure. FLUOROSCOPY TIME:  Fluoroscopy Time:  42 seconds Radiation Exposure Index (if provided by the fluoroscopic device): 35.86 mGy Number of Acquired Spot Images: 14 COMPARISON:  MRCP from 12/16/2021 FINDINGS: Duodenal scope is positioned over the second portion the duodenum. There is retrograde cannulation of the common bile duct and intrahepatic biliary tree. Retrograde cholangiogram demonstrates mild extrahepatic biliary ductal dilation. Evaluation of the mid and distal common bile duct is limited by overlying duodenal scope. Cholecystectomy clips are present. IMPRESSION: Retrograde cholangiogram demonstrating mild extrahepatic biliary ductal dilation with limited visualization of the mid to distal common bile duct. These images were submitted for radiologic interpretation only. Please see the procedural report for the full procedural details, amount of contrast, and the fluoroscopy time utilized. Electronically Signed   By: Ruthann Cancer M.D.   On: 01/15/2022 14:07  ? ?CT  CHEST ABDOMEN PELVIS WO CONTRAST ? ?Result Date: 01/16/2022 ?CLINICAL DATA:  Aortic aneurysm, known or suspected abdominal pain, known aortic aneurysm, s/p ERCP earlier today. Abdominal pain, nausea. EXAM: CT CHEST, ABDOMEN AND PELVIS WITHOUT CONTRAST TECHNIQUE: Multidetector CT imaging of the chest, abdomen and pelvis was performed following the standard protocol without IV contrast. RADIATION DOSE REDUCTION: This exam was performed according to the departmental dose-optimization program which includes automated exposure control, adjustment of the mA and/or kV according to patient size and/or use of iterative reconstruction technique. COMPARISON:  None. FINDINGS: CT CHEST FINDINGS Cardiovascular: No significant vascular findings. Normal heart size. No pericardial effusion. Mediastinum/Nodes: No enlarged mediastinal, hilar, or axillary lymph nodes. Thyroid gland, trachea, and esophagus demonstrate no significant findings. Lungs/Pleura: Lungs are clear. No pleural effusion or pneumothorax. Musculoskeletal: No chest wall mass or suspicious bone lesions identified. CT ABDOMEN PELVIS FINDINGS Hepatobiliary: Status post cholecystectomy. Mild intra and extrahepatic pneumobilia in keeping with given history of recent sphincterotomy.  Several punctate foci of gas seen adjacent to the surgical clips may represent gas within the residual cystic duct. There is interstitial fluid extending along the anti mesenteric border of the duodenum into the hepatic hilum, either postsurgical in nature or potentially inflammatory related to inflammation of the pancreatic head or second portion of the duodenum. Pancreas: Mild peripancreatic inflammatory stranding surrounds the head and uncinate process of the pancreas, best seen on image # 61/2, which may reflect changes of acute pancreatitis or retroperitoneal fluid related to recent surgical procedure. The body and tail the pancreas are unremarkable. No loculated peripancreatic fluid  collections. Spleen: Unremarkable Adrenals/Urinary Tract: Adrenal glands are unremarkable. Kidneys are normal, without renal calculi, focal lesion, or hydronephrosis. Bladder is unremarkable. Stomach/Bowel: Moderate sto

## 2022-01-15 NOTE — Transfer of Care (Signed)
Immediate Anesthesia Transfer of Care Note ? ?Patient: Laurie Terry ? ?Procedure(s) Performed: ENDOSCOPIC RETROGRADE CHOLANGIOPANCREATOGRAPHY (ERCP) ?SPHINCTEROTOMY ?REMOVAL OF STONES ? ?Patient Location: PACU ? ?Anesthesia Type:General ? ?Level of Consciousness: awake, alert , oriented and patient cooperative ? ?Airway & Oxygen Therapy: Patient Spontanous Breathing and Patient connected to face mask oxygen ? ?Post-op Assessment: Report given to RN and Post -op Vital signs reviewed and stable ? ?Post vital signs: Reviewed and stable ? ?Last Vitals:  ?Vitals Value Taken Time  ?BP 121/76 01/15/22 1405  ?Temp    ?Pulse 82 01/15/22 1405  ?Resp 12 01/15/22 1405  ?SpO2 98 % 01/15/22 1405  ? ? ?Last Pain:  ?Vitals:  ? 01/15/22 1405  ?TempSrc:   ?PainSc: 0-No pain  ?   ? ?  ? ?Complications: No notable events documented. ?

## 2022-01-15 NOTE — ED Provider Triage Note (Signed)
Emergency Medicine Provider Triage Evaluation Note ? ?Laurie Terry , a 56 y.o. female  was evaluated in triage.  Pt complains of abdominal pain.  Reports having an ERCP earlier today and shortly after she developed pain in her epigastric area that is consistent with her past bouts of pancreatitis. ? ?Review of Systems  ?Positive: Abdominal pain ?Negative: Nausea and vomiting ? ?Physical Exam  ?BP (!) 104/43 (BP Location: Left Arm)   Pulse 68   Temp 98.3 ?F (36.8 ?C) (Oral)   Resp 18   Ht '5\' 4"'$  (1.626 m)   Wt 67.6 kg   SpO2 100%   BMI 25.58 kg/m?  ?Gen:   Awake, no distress   ?Resp:  Normal effort  ?MSK:   Moves extremities without difficulty  ?Other:  Right-sided CVA tenderness as well as epigastric tenderness ? ?Medical Decision Making  ?Medically screening exam initiated at 8:05 PM.  Appropriate orders placed.  Laurie Terry was informed that the remainder of the evaluation will be completed by another provider, this initial triage assessment does not replace that evaluation, and the importance of remaining in the ED until their evaluation is complete. ? ?Allergic to contrast media ?  ?Rhae Hammock, PA-C ?01/15/22 2006 ? ?

## 2022-01-15 NOTE — Op Note (Signed)
[facility] ?Patient Name: Laurie Terry ?Procedure Date: 01/15/2022 ?MRN: [id number] ?Attending MD: Clarene Essex , MD ?Date of Birth: 1965/12/10 ?CSN: [id number] ?Age: 56 ?Admit Type: Outpatient ?Procedure:                ERCP ?Indications:              For therapy of bile duct stone(s) ?Providers:                Clarene Essex, MD, Ladoris Gene, RN, Charlean Merl  ?                          Purcell Nails, Technician, Cleda Daub, CRNA ?Referring MD:              ?Medicines:                General Anesthesia ?Complications:            No immediate complications. Estimated blood loss:  ?                          Minimal. ?Estimated Blood Loss:     Estimated blood loss was minimal. ?Procedure:                Pre-Anesthesia Assessment: ?                          - Prior to the procedure, a History and Physical  ?                          was performed, and patient medications and  ?                          allergies were reviewed. The patient's tolerance of  ?                          previous anesthesia was also reviewed. The risks  ?                          and benefits of the procedure and the sedation  ?                          options and risks were discussed with the patient.  ?                          All questions were answered, and informed consent  ?                          was obtained. Prior Anticoagulants: The patient has  ?                          taken no previous anticoagulant or antiplatelet  ?                          agents. ASA Grade Assessment: II - A patient with  ?  mild systemic disease. After reviewing the risks  ?                          and benefits, the patient was deemed in  ?                          satisfactory condition to undergo the procedure. ?                          After obtaining informed consent, the scope was  ?                          passed under direct vision. Throughout the  ?                          procedure, the patient's blood  pressure, pulse, and  ?                          oxygen saturations were monitored continuously. The  ?                          [id number]-[id number] ([id number]) Olympus duodenoscope was  ?                          introduced through the mouth, and used to inject  ?                          contrast into and used to cannulate the bile duct.  ?                          The ERCP was accomplished without difficulty. The  ?                          patient tolerated the procedure well. ?Findings: ?     The major papilla was small and fairly flat. Deep selective cannulation  ?     was obtained on the first attempt and using nonionic dye we were  ?     confirmed in the biliary system although it was difficult to get  ?     adequate pictures at this time and we proceeded with a biliary  ?     sphincterotomy was made with a Hydratome sphincterotome using ERBE  ?     electrocautery. There was self limited oozing from the sphincterotomy  ?     which did not require treatment. We proceeded with the sphincterotomy  ?     until we had adequate biliary drainage and could get the fully bowed  ?     sphincterotome easily in and out of the duct and the biliary tree was  ?     then swept with a 15 mm balloon starting at the bifurcation a few times.  ?     All stones were removed. Nothing was found on multiple balloon  ?     pull-through's at the end of the procedure as well as nothing was seen  ?     on occlusion cholangiogram and there was adequate biliary drainage at  ?  the end of the procedure and there was no pancreatic duct injection or  ?     wire advancement throughout the procedure and there was no bleeding seen  ?     at the end of the procedure and the wire balloon and scope were all  ?     removed and the patient tolerated the procedure well. ?Impression:               - The major papilla appeared to be small and fairly  ?                          flat. ?                          - Choledocholithiasis was found. Complete removal  ?                           was accomplished by biliary sphincterotomy and  ?                          balloon extraction. ?                          - A biliary sphincterotomy was performed. ?                          - The biliary tree was swept and nothing was found  ?                          at the end of the procedure. ?Moderate Sedation: ?     Not Applicable - Patient had care per Anesthesia. ?Recommendation:           - Clear liquid diet for 6 hours. ?                          - No aspirin, ibuprofen, naproxen, or other  ?                          non-steroidal anti-inflammatory drugs for 5 days. ?                          - Return to GI clinic PRN. ?                          - Telephone GI clinic if symptomatic PRN. ?Procedure Code(s):        --- Professional --- ?                          [phone], Endoscopic retrograde  ?                          cholangiopancreatography (ERCP); with removal of  ?                          calculi/debris from biliary/pancreatic duct(s) ?  [id number], Endoscopic retrograde  ?                          cholangiopancreatography (ERCP); with  ?                          sphincterotomy/papillotomy ?Diagnosis Code(s):        --- Professional --- ?                          K80.50, Calculus of bile duct without cholangitis  ?                          or cholecystitis without obstruction ?                          K83.8, Other specified diseases of biliary tract ?CPT copyright 2019 [employer]. All rights reserved. ?The codes documented in this report are preliminary and upon coder review may  ?be revised to meet current compliance requirements. ?Clarene Essex, MD ?01/15/2022 1:51:46 PM ?This report has been signed electronically. ?Number of Addenda: 0 ?

## 2022-01-15 NOTE — Anesthesia Postprocedure Evaluation (Signed)
Anesthesia Post Note ? ?Patient: Laurie Terry ? ?Procedure(s) Performed: ENDOSCOPIC RETROGRADE CHOLANGIOPANCREATOGRAPHY (ERCP) ?SPHINCTEROTOMY ?REMOVAL OF STONES ? ?  ? ?Patient location during evaluation: PACU ?Anesthesia Type: General ?Level of consciousness: awake and alert ?Pain management: pain level controlled ?Vital Signs Assessment: post-procedure vital signs reviewed and stable ?Respiratory status: spontaneous breathing, nonlabored ventilation and respiratory function stable ?Cardiovascular status: blood pressure returned to baseline ?Postop Assessment: no apparent nausea or vomiting ?Anesthetic complications: no ? ? ?No notable events documented. ? ?Last Vitals:  ?Vitals:  ? 01/15/22 1410 01/15/22 1420  ?BP: 112/67 108/66  ?Pulse: 63 (!) 58  ?Resp: 11 18  ?Temp:    ?SpO2: 100% 98%  ?  ?Last Pain:  ?Vitals:  ? 01/15/22 1420  ?TempSrc:   ?PainSc: 0-No pain  ? ? ?  ?  ?  ?  ?  ?  ? ?Marthenia Rolling ? ? ? ? ?

## 2022-01-15 NOTE — Anesthesia Procedure Notes (Signed)
Procedure Name: Intubation ?Date/Time: 01/15/2022 1:09 PM ?Performed by: Cleda Daub, CRNA ?Pre-anesthesia Checklist: Patient identified, Emergency Drugs available, Suction available and Patient being monitored ?Patient Re-evaluated:Patient Re-evaluated prior to induction ?Oxygen Delivery Method: Circle system utilized ?Preoxygenation: Pre-oxygenation with 100% oxygen ?Induction Type: IV induction ?Ventilation: Mask ventilation without difficulty ?Laryngoscope Size: Glidescope and 3 ?Grade View: Grade I ?Tube type: Oral ?Tube size: 7.0 mm ?Number of attempts: 1 ?Airway Equipment and Method: Stylet and Oral airway ?Placement Confirmation: ETT inserted through vocal cords under direct vision, positive ETCO2 and breath sounds checked- equal and bilateral ?Secured at: 20 cm ?Tube secured with: Tape ?Dental Injury: Teeth and Oropharynx as per pre-operative assessment  ?Comments: Micrognathia- easy mask ventilation; MDA and CRNA had 2b view with miller 2 and MAC 3.  Successful intubation with glidescope.  ? ? ? ? ?

## 2022-01-16 ENCOUNTER — Observation Stay (HOSPITAL_COMMUNITY): Payer: 59

## 2022-01-16 ENCOUNTER — Encounter (HOSPITAL_COMMUNITY): Payer: Self-pay | Admitting: Internal Medicine

## 2022-01-16 DIAGNOSIS — Z8349 Family history of other endocrine, nutritional and metabolic diseases: Secondary | ICD-10-CM | POA: Diagnosis not present

## 2022-01-16 DIAGNOSIS — Z803 Family history of malignant neoplasm of breast: Secondary | ICD-10-CM | POA: Diagnosis not present

## 2022-01-16 DIAGNOSIS — R509 Fever, unspecified: Secondary | ICD-10-CM | POA: Diagnosis not present

## 2022-01-16 DIAGNOSIS — I5030 Unspecified diastolic (congestive) heart failure: Secondary | ICD-10-CM | POA: Diagnosis not present

## 2022-01-16 DIAGNOSIS — I471 Supraventricular tachycardia: Secondary | ICD-10-CM | POA: Diagnosis present

## 2022-01-16 DIAGNOSIS — K859 Acute pancreatitis without necrosis or infection, unspecified: Secondary | ICD-10-CM | POA: Diagnosis present

## 2022-01-16 DIAGNOSIS — K621 Rectal polyp: Secondary | ICD-10-CM | POA: Diagnosis not present

## 2022-01-16 DIAGNOSIS — Z91041 Radiographic dye allergy status: Secondary | ICD-10-CM | POA: Diagnosis not present

## 2022-01-16 DIAGNOSIS — Z823 Family history of stroke: Secondary | ICD-10-CM | POA: Diagnosis not present

## 2022-01-16 DIAGNOSIS — Z87891 Personal history of nicotine dependence: Secondary | ICD-10-CM | POA: Diagnosis not present

## 2022-01-16 DIAGNOSIS — K8050 Calculus of bile duct without cholangitis or cholecystitis without obstruction: Secondary | ICD-10-CM | POA: Diagnosis not present

## 2022-01-16 DIAGNOSIS — Z9049 Acquired absence of other specified parts of digestive tract: Secondary | ICD-10-CM | POA: Diagnosis not present

## 2022-01-16 DIAGNOSIS — Z881 Allergy status to other antibiotic agents status: Secondary | ICD-10-CM | POA: Diagnosis not present

## 2022-01-16 DIAGNOSIS — Z8261 Family history of arthritis: Secondary | ICD-10-CM | POA: Diagnosis not present

## 2022-01-16 DIAGNOSIS — Z91013 Allergy to seafood: Secondary | ICD-10-CM | POA: Diagnosis not present

## 2022-01-16 DIAGNOSIS — J45909 Unspecified asthma, uncomplicated: Secondary | ICD-10-CM | POA: Diagnosis present

## 2022-01-16 DIAGNOSIS — K219 Gastro-esophageal reflux disease without esophagitis: Secondary | ICD-10-CM | POA: Diagnosis present

## 2022-01-16 DIAGNOSIS — Z882 Allergy status to sulfonamides status: Secondary | ICD-10-CM | POA: Diagnosis not present

## 2022-01-16 DIAGNOSIS — J90 Pleural effusion, not elsewhere classified: Secondary | ICD-10-CM | POA: Diagnosis not present

## 2022-01-16 DIAGNOSIS — Z91018 Allergy to other foods: Secondary | ICD-10-CM | POA: Diagnosis not present

## 2022-01-16 DIAGNOSIS — K8071 Calculus of gallbladder and bile duct without cholecystitis with obstruction: Secondary | ICD-10-CM | POA: Diagnosis present

## 2022-01-16 DIAGNOSIS — Y836 Removal of other organ (partial) (total) as the cause of abnormal reaction of the patient, or of later complication, without mention of misadventure at the time of the procedure: Secondary | ICD-10-CM | POA: Diagnosis present

## 2022-01-16 DIAGNOSIS — Z880 Allergy status to penicillin: Secondary | ICD-10-CM | POA: Diagnosis not present

## 2022-01-16 DIAGNOSIS — K521 Toxic gastroenteritis and colitis: Secondary | ICD-10-CM | POA: Diagnosis present

## 2022-01-16 DIAGNOSIS — Z888 Allergy status to other drugs, medicaments and biological substances status: Secondary | ICD-10-CM | POA: Diagnosis not present

## 2022-01-16 DIAGNOSIS — Z85820 Personal history of malignant melanoma of skin: Secondary | ICD-10-CM | POA: Diagnosis not present

## 2022-01-16 DIAGNOSIS — R1013 Epigastric pain: Secondary | ICD-10-CM | POA: Diagnosis present

## 2022-01-16 DIAGNOSIS — I712 Thoracic aortic aneurysm, without rupture, unspecified: Secondary | ICD-10-CM | POA: Diagnosis present

## 2022-01-16 DIAGNOSIS — K9189 Other postprocedural complications and disorders of digestive system: Secondary | ICD-10-CM | POA: Diagnosis present

## 2022-01-16 DIAGNOSIS — Z20822 Contact with and (suspected) exposure to covid-19: Secondary | ICD-10-CM | POA: Diagnosis present

## 2022-01-16 DIAGNOSIS — K851 Biliary acute pancreatitis without necrosis or infection: Secondary | ICD-10-CM | POA: Diagnosis present

## 2022-01-16 LAB — HIV ANTIBODY (ROUTINE TESTING W REFLEX): HIV Screen 4th Generation wRfx: NONREACTIVE

## 2022-01-16 LAB — PREGNANCY, URINE: Preg Test, Ur: NEGATIVE

## 2022-01-16 LAB — CBC WITH DIFFERENTIAL/PLATELET
Abs Immature Granulocytes: 0.03 10*3/uL (ref 0.00–0.07)
Basophils Absolute: 0 10*3/uL (ref 0.0–0.1)
Basophils Relative: 0 %
Eosinophils Absolute: 0 10*3/uL (ref 0.0–0.5)
Eosinophils Relative: 0 %
HCT: 34.5 % — ABNORMAL LOW (ref 36.0–46.0)
Hemoglobin: 11.1 g/dL — ABNORMAL LOW (ref 12.0–15.0)
Immature Granulocytes: 0 %
Lymphocytes Relative: 11 %
Lymphs Abs: 1.2 10*3/uL (ref 0.7–4.0)
MCH: 29.3 pg (ref 26.0–34.0)
MCHC: 32.2 g/dL (ref 30.0–36.0)
MCV: 91 fL (ref 80.0–100.0)
Monocytes Absolute: 0.9 10*3/uL (ref 0.1–1.0)
Monocytes Relative: 8 %
Neutro Abs: 8.8 10*3/uL — ABNORMAL HIGH (ref 1.7–7.7)
Neutrophils Relative %: 81 %
Platelets: 210 10*3/uL (ref 150–400)
RBC: 3.79 MIL/uL — ABNORMAL LOW (ref 3.87–5.11)
RDW: 13.4 % (ref 11.5–15.5)
WBC: 10.9 10*3/uL — ABNORMAL HIGH (ref 4.0–10.5)
nRBC: 0 % (ref 0.0–0.2)

## 2022-01-16 LAB — MAGNESIUM: Magnesium: 2.1 mg/dL (ref 1.7–2.4)

## 2022-01-16 LAB — COMPREHENSIVE METABOLIC PANEL
ALT: 16 U/L (ref 0–44)
AST: 14 U/L — ABNORMAL LOW (ref 15–41)
Albumin: 3.3 g/dL — ABNORMAL LOW (ref 3.5–5.0)
Alkaline Phosphatase: 46 U/L (ref 38–126)
Anion gap: 4 — ABNORMAL LOW (ref 5–15)
BUN: 14 mg/dL (ref 6–20)
CO2: 24 mmol/L (ref 22–32)
Calcium: 7.7 mg/dL — ABNORMAL LOW (ref 8.9–10.3)
Chloride: 108 mmol/L (ref 98–111)
Creatinine, Ser: 0.55 mg/dL (ref 0.44–1.00)
GFR, Estimated: >60 mL/min (ref >60)
Glucose, Bld: 102 mg/dL — ABNORMAL HIGH (ref 70–99)
Potassium: 3.9 mmol/L (ref 3.5–5.1)
Sodium: 136 mmol/L (ref 135–145)
Total Bilirubin: 0.7 mg/dL (ref 0.3–1.2)
Total Protein: 5.5 g/dL — ABNORMAL LOW (ref 6.5–8.1)

## 2022-01-16 LAB — TROPONIN I (HIGH SENSITIVITY): Troponin I (High Sensitivity): <2 ng/L (ref <18)

## 2022-01-16 LAB — LIPASE, BLOOD: Lipase: 48 U/L (ref 11–51)

## 2022-01-16 MED ORDER — METRONIDAZOLE 500 MG/100ML IV SOLN
500.0000 mg | Freq: Two times a day (BID) | INTRAVENOUS | Status: DC
  Administered 2022-01-16 – 2022-01-20 (×9): 500 mg via INTRAVENOUS
  Filled 2022-01-16 (×9): qty 100

## 2022-01-16 MED ORDER — FAMOTIDINE 20 MG PO TABS
20.0000 mg | ORAL_TABLET | Freq: Every day | ORAL | Status: DC
  Administered 2022-01-16 – 2022-01-26 (×12): 20 mg via ORAL
  Filled 2022-01-16 (×11): qty 1

## 2022-01-16 MED ORDER — ACETAMINOPHEN 325 MG PO TABS
650.0000 mg | ORAL_TABLET | Freq: Four times a day (QID) | ORAL | Status: DC | PRN
  Administered 2022-01-18 – 2022-01-26 (×15): 650 mg via ORAL
  Filled 2022-01-16 (×16): qty 2

## 2022-01-16 MED ORDER — ONDANSETRON HCL 4 MG/2ML IJ SOLN
4.0000 mg | Freq: Four times a day (QID) | INTRAMUSCULAR | Status: DC | PRN
  Administered 2022-01-19 – 2022-01-21 (×7): 4 mg via INTRAVENOUS
  Filled 2022-01-16 (×7): qty 2

## 2022-01-16 MED ORDER — PREDNISONE 50 MG PO TABS
50.0000 mg | ORAL_TABLET | Freq: Four times a day (QID) | ORAL | Status: AC
  Administered 2022-01-16 – 2022-01-17 (×3): 50 mg via ORAL
  Filled 2022-01-16 (×3): qty 1

## 2022-01-16 MED ORDER — SODIUM CHLORIDE 0.9 % IV SOLN
2.0000 g | INTRAVENOUS | Status: DC
  Administered 2022-01-16 – 2022-01-20 (×5): 2 g via INTRAVENOUS
  Filled 2022-01-16 (×5): qty 20

## 2022-01-16 MED ORDER — MORPHINE SULFATE (PF) 4 MG/ML IV SOLN
4.0000 mg | INTRAVENOUS | Status: DC | PRN
  Administered 2022-01-16: 4 mg via INTRAVENOUS
  Administered 2022-01-16: 2 mg via INTRAVENOUS
  Administered 2022-01-16 – 2022-01-18 (×3): 4 mg via INTRAVENOUS
  Filled 2022-01-16 (×6): qty 1

## 2022-01-16 MED ORDER — METOPROLOL TARTRATE 25 MG PO TABS: 25.0000 mg | ORAL_TABLET | Freq: Every day | ORAL | Status: DC | PRN

## 2022-01-16 MED ORDER — DIPHENHYDRAMINE HCL 25 MG PO CAPS
50.0000 mg | ORAL_CAPSULE | Freq: Once | ORAL | Status: AC
  Administered 2022-01-17: 50 mg via ORAL
  Filled 2022-01-16: qty 2

## 2022-01-16 MED ORDER — LACTATED RINGERS IV SOLN: INTRAVENOUS | Status: AC

## 2022-01-16 MED ORDER — SODIUM CHLORIDE 0.9 % IV SOLN: Freq: Once | INTRAVENOUS | Status: AC

## 2022-01-16 MED ORDER — ONDANSETRON HCL 4 MG PO TABS: 4.0000 mg | ORAL_TABLET | Freq: Four times a day (QID) | ORAL | Status: DC | PRN

## 2022-01-16 MED ORDER — ALBUTEROL SULFATE (2.5 MG/3ML) 0.083% IN NEBU: 3.0000 mL | INHALATION_SOLUTION | RESPIRATORY_TRACT | Status: DC | PRN

## 2022-01-16 MED ORDER — ENOXAPARIN SODIUM 40 MG/0.4ML IJ SOSY
40.0000 mg | PREFILLED_SYRINGE | INTRAMUSCULAR | Status: DC
  Administered 2022-01-16 – 2022-01-25 (×10): 40 mg via SUBCUTANEOUS
  Filled 2022-01-16 (×10): qty 0.4

## 2022-01-16 MED ORDER — POLYETHYLENE GLYCOL 3350 17 G PO PACK
17.0000 g | PACK | Freq: Every day | ORAL | Status: DC | PRN
  Administered 2022-01-17: 17 g via ORAL
  Filled 2022-01-16: qty 1

## 2022-01-16 MED ORDER — HYDROMORPHONE HCL 1 MG/ML IJ SOLN
0.5000 mg | Freq: Once | INTRAMUSCULAR | Status: AC
  Administered 2022-01-16: 0.5 mg via INTRAVENOUS
  Filled 2022-01-16: qty 1

## 2022-01-16 MED ORDER — FLUTICASONE PROPIONATE 50 MCG/ACT NA SUSP: 2.0000 | Freq: Every day | NASAL | Status: DC | PRN

## 2022-01-16 MED ORDER — MORPHINE SULFATE (PF) 2 MG/ML IV SOLN
2.0000 mg | INTRAVENOUS | Status: DC | PRN
  Administered 2022-01-16 – 2022-01-22 (×10): 2 mg via INTRAVENOUS
  Filled 2022-01-16 (×10): qty 1

## 2022-01-16 MED ORDER — ACETAMINOPHEN 650 MG RE SUPP: 650.0000 mg | Freq: Four times a day (QID) | RECTAL | Status: DC | PRN

## 2022-01-16 NOTE — Assessment & Plan Note (Signed)
?   Continue home regimen of daily H2 blocker ?

## 2022-01-16 NOTE — Assessment & Plan Note (Signed)
?   Patient presenting with epigastric pain, and CT findings that are thought to be attributed to post ERCP pancreatitis ?? Placing patient on intravenous volume resuscitation ?? Keeping patient n.p.o. for now except for small sips of clears and ice chips ?? Will slowly advanced diet as abdominal pain abates. ?? As needed opiate-based analgesics for associated substantial pain ?? ER provider discussed case with Dr. Deno Etienne, gastroenterology will come evaluate patient in consultation today ? ?

## 2022-01-16 NOTE — Assessment & Plan Note (Signed)
?   Choledocholithiasis identified on recent EUS with Dr. Hosie Poisson ?? Patient underwent ERCP on 4/25 which involved sphincterotomy and removal of multiple stones successfully ?

## 2022-01-16 NOTE — Assessment & Plan Note (Signed)
?   Monitoring on telemetry ?? Currently normal sinus rhythm ?? Continuing home regimen of as needed metoprolol ?

## 2022-01-16 NOTE — H&P (Signed)
?History and Physical  ? ? ?Patient: Laurie Terry MRN: [id number] DOA: 01/15/2022 ? ?Date of Service: the patient was seen and examined on 01/16/2022 ? ?Patient coming from: Home ? ?Chief Complaint:  ?Chief Complaint  ?Patient presents with  ? Abdominal Pain  ? Nausea  ? ? ?HPI:  ? ?56 year old female with past medical history of melanoma (S/p removal 2000), paroxysmal SVT, thoracic aortic aneurysm, iodinated contrast allergy, multiple episodes of gallstone pancreatitis status post cholecystectomy in 2017 which then was complicated with biliary leak. ? ?Patient has recently been experiencing symptoms of abdominal pain as of late and had undergone ERCP with Dr. Watt Climes on 4/25 identifying and successfully retrieving a common bile duct stone. ? ?Since patient got home at approximately 3:30 PM after ERCP patient developed epigastric abdominal pain..  Abdominal pain is sharp in quality, severe in intensity, radiating to the back.  Abdominal pain is worse with movement and associated with nausea with inability to tolerate oral intake.  Patient states that this feeling is similar to her bouts of pancreatitis in the past.  Patient discussed the symptoms with her gastroenterologist and was instructed to come to the emergency department for evaluation. ? ?Upon evaluation in the emergency department patient underwent CT imaging of the chest abdomen and pelvis which revealed mild intra and extrahepatic pneumobilia with punctate foci of gas in the hepatic hilum.  Additionally there is infiltrative fluid adjacent to the uncontained process and head of the pancreas as well as along the antimesenteric border of the second portion of the duodenum.  These images were reviewed with Dr. Deno Etienne with gastroenterology who felt that the patient's presentation and images were secondary to acute pancreatitis.  They recommended medicine admit for management with gastroenterology to consult in the morning.  The hospitalist group was then  called to assess the patient for admission to the hospital. ? ? ? ?Review of Systems: Review of Systems  ?Gastrointestinal:  Positive for abdominal pain and nausea.  ?All other systems reviewed and are negative. ? ? ?Past Medical History:  ?Diagnosis Date  ? Allergic rhinitis   ? Aortic aneurysm ([place]) 09/23/2020  ? Cancer [facility])   ? Melanoma  ? Cervical dysplasia 2007  ? low-grade SIL, normal Pap smears since then  ? Family history of adverse reaction to anesthesia   ? mom has h/o post op nausea/vomiting  ? Intermittent palpitations   ? IUD   ? inserted 03/2002.04/06/2007, 06/03/2012  ? Ocular migraine   ? Osteomalacia   ? Pancreatitis   ? Seafood allergy   ? ? ?Past Surgical History:  ?Procedure Laterality Date  ? BUNIONECTOMY  2018  ? CHOLECYSTECTOMY N/A 04/09/2016  ? Procedure: LAPAROSCOPIC CHOLECYSTECTOMY ;  Surgeon: Rolm Bookbinder, MD;  Location: [place];  Service: General;  Laterality: N/A;  ? ERCP  04/11/2016  ? ERCP with pancreatic stent and failed CBD stent after needle knife and conventional sphincterotomy  ? ERCP N/A 04/12/2016  ? Procedure: ENDOSCOPIC RETROGRADE CHOLANGIOPANCREATOGRAPHY (ERCP);  Surgeon: Clarene Essex, MD;  Location: [facility] ENDOSCOPY;  Service: Endoscopy;  Laterality: N/A;  ? FOOT SURGERY    ? toenail  ? INTRAUTERINE DEVICE INSERTION  08/07/2017  ? Mirena  ? removal of melanoma    ? ? ?Social History:  reports that she has quit smoking. She has never used smokeless tobacco. She reports that she does not currently use alcohol. She reports that she does not use drugs. ? ?Allergies  ?Allergen Reactions  ? Contrast Media [Iodinated Contrast Media] Shortness  Of Breath  ?  Low blood pressure   ? Iodine Shortness Of Breath and Swelling  ? Other Hives and Swelling  ?  Seafood  ? Ampicillin Other (See Comments)  ?  unknown  ? Avocado Itching  ?  Tongue and mouth  ? Levofloxacin Hives  ? Pantoprazole Other (See Comments)  ?  Joint aches, fever, peeling of skin  ? Septra [Bactrim] Hives  ?  Sulfamethoxazole-Trimethoprim Other (See Comments) and Hives  ? ? ?Family History  ?Problem Relation Age of Onset  ? Tremor Mother   ? Stroke Father   ? Autoimmune disease Father   ? CVA Father   ? Breast cancer Sister 60  ? Arthritis Sister   ?     Psoriatic  ? Thyroid disease Sister   ? Breast cancer Maternal Grandmother   ?     Age 33  ? Thyroid disease Daughter   ?     Hypo  ? ? ?Prior to Admission medications   ?Medication Sig Start Date End Date Taking? Authorizing Provider  ?Adapalene 0.3 % gel Apply 1 application. topically 3 (three) times a week.  01/26/22  [provider]  ?albuterol (PROAIR HFA) 108 (90 Base) MCG/ACT inhaler Inhale 2 puffs into the lungs every 4 (four) hours as needed for wheezing or shortness of breath (coughing fits). 05/23/21   Garnet Sierras, DO  ?eletriptan (RELPAX) 40 MG tablet 40 mg every 2 (two) hours as needed for migraine. 05/18/20   [provider]  ?EPINEPHrine 0.3 mg/0.3 mL IJ SOAJ injection Inject 0.3 mg into the muscle as needed for anaphylaxis.    [provider]  ?famotidine (PEPCID) 20 MG tablet Take 20 mg by mouth daily as needed for heartburn or indigestion.    [provider]  ?fluticasone (FLONASE) 50 MCG/ACT nasal spray 2 sprays 2 (two) times daily as needed for allergies. 07/14/20   [provider]  ?ketotifen (ZADITOR) 0.025 % ophthalmic solution 1 drop daily as needed (Itching eye and allergies).    [provider]  ?levocetirizine (XYZAL) 5 MG tablet Take 1 tablet (5 mg total) by mouth every evening. ?Patient not taking: Reported on 01/10/2022 05/23/21   Garnet Sierras, DO  ?metoprolol tartrate (LOPRESSOR) 25 MG tablet Take 1 tablet (25 mg total) by mouth 2 (two) times daily. ?Patient taking differently: Take 25 mg by mouth daily as needed (svt). 10/24/21   Werner Lean, MD  ? ? ?Physical Exam: ? ?Vitals:  ? 01/16/22 0400 01/16/22 0500 01/16/22 0600 01/16/22 0700  ?BP: '93/60 99/62 98/60 '$ 98/61  ?Pulse: 72 68 63  65  ?Resp: '12 11 13 11  '$ ?Temp:      ?TempSrc:      ?SpO2: 91% 91% 92% 95%  ?Weight:      ?Height:      ? ? ?Constitutional: Awake alert and oriented x3, in distress due to abdominal pain ?Skin: no rashes, no lesions, poor skin turgor noted. ?Eyes: Pupils are equally reactive to light.  No evidence of scleral icterus or conjunctival pallor.  ?ENMT: Dry mucous membranes noted.  Posterior pharynx clear of any exudate or lesions.   ?Neck: normal, supple, no masses, no thyromegaly.  No evidence of jugular venous distension.   ?Respiratory: clear to auscultation bilaterally, no wheezing, no crackles. Normal respiratory effort. No accessory muscle use.  ?Cardiovascular: Regular rate and rhythm, no murmurs / rubs / gallops. No extremity edema. 2+ pedal pulses. No carotid bruits.  ?Chest:  Nontender without crepitus or deformity.   ?Back:   Nontender without crepitus or deformity. ?Abdomen: Notable epigastric and right lower quadrant pain.  Abdomen is soft.  No evidence of intra-abdominal masses.  Positive bowel sounds noted in all quadrants.   ?Musculoskeletal: No joint deformity upper and lower extremities. Good ROM, no contractures. Normal muscle tone.  ?Neurologic: CN 2-12 grossly intact. Sensation intact.  Patient moving all 4 extremities spontaneously.  Patient is following all commands.  Patient is responsive to verbal stimuli.   ?Psychiatric: Patient exhibits normal mood with appropriate affect.  Patient seems to possess insight as to their current situation.   ? ?Data Reviewed: ? ?I have personally reviewed and interpreted labs, imaging. ? ?Significant findings are lipase 40, Glucose 163, white blood cell count 7.4, hemoglobin 14.0, hematocrit 42.5, urinalysis unremarkable, troponin less than 2. ? ?CT imaging of the chest abdomen pelvis revealing interval development of mild intra and extrahepatic pneumobilia in keeping with given history of ERCP and sphincterotomy.  Punctate foci of gas within the hepatic hilum.   Infiltrative fluid seen adjacent to the uncinate process and head of the pancreas as well as along the antimesenteric border of the second portion of the duodenum extending into the hepatic hilum. ? ?EKG: Personally reviewe

## 2022-01-16 NOTE — Progress Notes (Addendum)
UGI requested to rule out duodenal perf/leak post ERCP based on recent CT imaging study report. ?Barium not used when leak is suspected. ?Normally Omnipaque is used in this setting ?Pt has contrast allergy. ?Allergic reaction reaction risk much lower in po form, not intravascular. However, pt expresses anxiety of having rxn without pre-medication. ? ?Discussed with ordering MD, Dr. Alessandra Bevels, who feels clinical suspicion is very low and pt is stable. ?Will plan to order 13 hr contrast allergy pre-medication regimen and plan for study to be performed tomorrow am. ?Pt to be kept NPO x meds. ? ?Ascencion Dike PA-C ?Interventional Radiology ?01/16/2022 ?1:39 PM ? ? ? ?

## 2022-01-16 NOTE — Progress Notes (Signed)
This is a pleasant 56 year old lady with history of melanoma, paroxysmal SVT, thoracic aortic aneurysm and other comorbidities presented to emergency department yesterday afternoon after having outpatient ERCP and was admitted with acute pancreatitis in the ED.  GI is on board.  She has been started on antibiotics.  Patient seen and examined in the ED.  She states that her abdominal pain is much better.  She has mild epigastric pain radiating to the back.  No nausea or vomiting.  On exam she has mild right upper quadrant and epigastric tenderness.CT scan consistent with acute pancreatitis however lipase level is normal.  She is NPO.  Management per GI.  Appreciate their help. ?

## 2022-01-16 NOTE — Progress Notes (Addendum)
[facility] Progress Note ? ?Laurie Terry 56 y.o. 05/28/1966 ? ?CC: Post ERCP pancreatitis ? ? ?Subjective: ?Patient underwent outpatient ERCP yesterday for right-sided abdominal pain and cholelithiasis and had a sphincterotomy with CBD stone removal.  Patient subsequently started noticing worsening right upper quadrant pain and epigastric pain radiating to back.  Patient had some nausea.  Denied any vomiting.  Denied fever.  No bowel movement last couple days.  She presented back to emergency department.  Initial blood work showed normal CBC, CMP and normal lipase. ? ?CT abdomen pelvis without contrast showed intra and extrahepatic pneumobilia probably from sphincterotomy.  Also showed small foci of gas within hepatic hilum which could represent gas within the remnant of cystic duct and less likely an extraluminal gas.  CT scan also showed changes of pancreatitis. ? ?Patient is feeling much better now.  Abdominal pain has improved significantly. ? ?ROS : Negative for chest pain.  Afebrile.  Negative for bleeding.  Positive for constipation and abdominal pain. ? ? ?Objective: ?Vital signs in last 24 hours: ?Vitals:  ? 01/16/22 0600 01/16/22 0700  ?BP: 98/60 98/61  ?Pulse: 63 65  ?Resp: 13 11  ?Temp:    ?SpO2: 92% 95%  ? ? ?Physical Exam: ? ?General:  Alert, cooperative, no distress, appears stated age  ?Head:  Normocephalic, without obvious abnormality, atraumatic  ?Eyes:  , EOM's intact,   ?Lungs:   Clear to auscultation bilaterally, respirations unlabored  ?Heart:  Regular rate and rhythm, S1, S2 normal  ?Abdomen:   Soft, right upper quadrant and epigastric tenderness to palpation, abdomen is soft, bowel sounds present.  No peritoneal signs  ?Extremities: Extremities normal, atraumatic, no  edema  ?Pulses: 2+ and symmetric  ? ? ?Lab Results: ?Recent Labs  ?  01/15/22 ?2017 01/16/22 ?0720  ?NA 136 136  ?K 3.9 3.9  ?CL 102 108  ?CO2 27 24  ?GLUCOSE 163* 102*  ?BUN 16 14  ?CREATININE 0.71 0.55   ?CALCIUM 9.0 7.7*  ?MG  --  2.1  ? ?Recent Labs  ?  01/15/22 ?2017 01/16/22 ?0720  ?AST 27 14*  ?ALT 23 16  ?ALKPHOS 62 46  ?BILITOT 0.6 0.7  ?PROT 7.2 5.5*  ?ALBUMIN 4.3 3.3*  ? ?Recent Labs  ?  01/15/22 ?2017 01/16/22 ?0720  ?WBC 7.4 10.9*  ?NEUTROABS  --  8.8*  ?HGB 14.0 11.1*  ?HCT 42.5 34.5*  ?MCV 89.7 91.0  ?PLT 265 210  ? ?No results for input(s): LABPROT, INR in the last 72 hours. ? ? ? ?Assessment/Plan: ?-Worsening abdominal pain after ERCP with a sphincterotomy.  CT scan showed possible pancreatitis versus pneumobilia and small amount of gas at hepatic hilum which could be gas within the remnant of cystic duct or possible extraluminal gas. ? ?Discussion ?-------------- ?-Patient's abdominal exam is benign with some expected right upper quadrant and epigastric tenderness with pancreatitis.  No peritoneal signs.  No leukocytosis.  No fever.  Less likely that patient has perforation. ? ?Recommendation ?------------------------ ?-Keep n.p.o. for now ?- Patient is allergic to penicillin.  Recommend to start Rocephin and Flagyl ?-Gastrografin upper GI series to rule out leak /perforation ?-Increase Ringer lactate to 200 cc/h ?-GI will follow ? ? ?Otis Brace MD, FACP ?01/16/2022, 9:18 AM ? ?Contact #  [phone]  ?

## 2022-01-16 NOTE — ED Notes (Signed)
Patient transported to X-ray 

## 2022-01-17 ENCOUNTER — Inpatient Hospital Stay (HOSPITAL_COMMUNITY): Payer: 59

## 2022-01-17 DIAGNOSIS — K859 Acute pancreatitis without necrosis or infection, unspecified: Secondary | ICD-10-CM | POA: Diagnosis not present

## 2022-01-17 DIAGNOSIS — K9189 Other postprocedural complications and disorders of digestive system: Secondary | ICD-10-CM | POA: Diagnosis not present

## 2022-01-17 LAB — COMPREHENSIVE METABOLIC PANEL
ALT: 14 U/L (ref 0–44)
AST: 13 U/L — ABNORMAL LOW (ref 15–41)
Albumin: 3.2 g/dL — ABNORMAL LOW (ref 3.5–5.0)
Alkaline Phosphatase: 46 U/L (ref 38–126)
Anion gap: 8 (ref 5–15)
BUN: 10 mg/dL (ref 6–20)
CO2: 22 mmol/L (ref 22–32)
Calcium: 8.3 mg/dL — ABNORMAL LOW (ref 8.9–10.3)
Chloride: 107 mmol/L (ref 98–111)
Creatinine, Ser: 0.6 mg/dL (ref 0.44–1.00)
GFR, Estimated: >60 mL/min (ref >60)
Glucose, Bld: 137 mg/dL — ABNORMAL HIGH (ref 70–99)
Potassium: 3.9 mmol/L (ref 3.5–5.1)
Sodium: 137 mmol/L (ref 135–145)
Total Bilirubin: 0.7 mg/dL (ref 0.3–1.2)
Total Protein: 5.6 g/dL — ABNORMAL LOW (ref 6.5–8.1)

## 2022-01-17 MED ORDER — LACTATED RINGERS IV SOLN: INTRAVENOUS | Status: DC

## 2022-01-17 MED ORDER — IOHEXOL 300 MG/ML  SOLN
100.0000 mL | Freq: Once | INTRAMUSCULAR | Status: AC | PRN
  Administered 2022-01-17: 75 mL via ORAL

## 2022-01-17 NOTE — Progress Notes (Signed)
[facility] Progress Note ? ?Laurie Terry 56 y.o. [birth year] ? ?CC: Post ERCP pancreatitis ? ? ?Subjective: ?Patient seen and examined lying comfortably in bed.  Notes she continues to have mild epigastric pain as well as right lower quadrant pain.  Notes epigastric pain is worse when drinking sips of water with meds.  No documented fever but says she feels as if she has been febrile.  No bowel movement today ? ?Upper GI series ?1. No evidence of duodenal leak or perforation. ?2. Contrast refluxes into the common bile duct, common hepatic duct, ?and cystic duct. ?3. Small gas bubbles in the common bile duct. ? ?ROS : Review of Systems  ?Gastrointestinal:  Positive for abdominal pain. Negative for blood in stool, constipation, diarrhea, heartburn, melena, nausea and vomiting.  ?Genitourinary:  Negative for dysuria and frequency.   ? ? ?Objective: ?Vital signs in last 24 hours: ?Vitals:  ? 01/17/22 0203 01/17/22 0444  ?BP: (!) 100/59 (!) 100/58  ?Pulse: 65 (!) 57  ?Resp: 20 20  ?Temp: 99.5 ?F (37.5 ?C) 99 ?F (37.2 ?C)  ?SpO2: 93% 94%  ? ? ?Physical Exam: ? ?General:  Alert, cooperative, no distress, appears stated age  ?Head:  Normocephalic, without obvious abnormality, atraumatic  ?Eyes:  Anicteric sclera, EOM's intact  ?Lungs:   Clear to auscultation bilaterally, respirations unlabored  ?Heart:  Regular rate and rhythm, S1, S2 normal  ?Abdomen:   Soft, cutely tender to the epigastric region and right lower quadrant area.,  Guarding, no rebound, bowel sounds active all four quadrants,  no masses,   ?Extremities: Extremities normal, atraumatic, no  edema  ?Pulses: 2+ and symmetric  ? ? ?Lab Results: ?Recent Labs  ?  01/16/22 ?0720 01/17/22 ?0336  ?NA 136 137  ?K 3.9 3.9  ?CL 108 107  ?CO2 24 22  ?GLUCOSE 102* 137*  ?BUN 14 10  ?CREATININE 0.55 0.60  ?CALCIUM 7.7* 8.3*  ?MG 2.1  --   ? ?Recent Labs  ?  01/16/22 ?0720 01/17/22 ?0336  ?AST 14* 13*  ?ALT 16 14  ?ALKPHOS 46 46  ?BILITOT 0.7 0.7  ?PROT 5.5*  5.6*  ?ALBUMIN 3.3* 3.2*  ? ?Recent Labs  ?  01/15/22 ?2017 01/16/22 ?0720  ?WBC 7.4 10.9*  ?NEUTROABS  --  8.8*  ?HGB 14.0 11.1*  ?HCT 42.5 34.5*  ?MCV 89.7 91.0  ?PLT 265 210  ? ?No results for input(s): LABPROT, INR in the last 72 hours. ? ? ? ?Assessment ?Worsening abdominal pain after ERCP with sphincterotomy. ? ?CT chest AP w/o contrast 4/25 ?Intra and extrahepatic pneumobilia probably from sphincterotomy. Also showed small foci of gas within hepatic hilum which could represent gas within the remnant of cystic duct and less likely an extraluminal gas.  CT scan also showed changes of pancreatitis. ? ?Upper GI series 01/17/2022 ?1. No evidence of duodenal leak or perforation. ?2. Contrast refluxes into the common bile duct, common hepatic duct, ?and cystic duct. ?3. Small gas bubbles in the common bile duct. ? ?Today patient has mild leukocytosis at 10.9, no documented fevers, no peritoneal signs, will continue to monitor closely, patient is already on antibiotics. ? ?Plan: ?Continue supportive care ?Continue ceftriaxone 2 g IV every 24 hours and metronidazole IV 500 mg every 12 hours. ?Continue to monitor for peritoneal signs, fever, significant leukocytosis, no signs of leakage or perforation on GI series. ?Will discuss with Dr. Alessandra Bevels for further management. ? ?Charlott Rakes PA-C ?01/17/2022, 10:26 AM ? ?Contact #  [phone]  ?

## 2022-01-17 NOTE — Plan of Care (Signed)
?  Problem: Clinical Measurements: Goal: Will remain free from infection Outcome: Not Progressing   Problem: Elimination: Goal: Will not experience complications related to bowel motility Outcome: Not Progressing   Problem: Pain Managment: Goal: General experience of comfort will improve Outcome: Not Progressing   

## 2022-01-17 NOTE — Progress Notes (Signed)
?PROGRESS NOTE ? ? ? ?Laurie Terry  ALP:[id number] DOB: 06/13/1966 DOA: 01/15/2022 ?PCP: [name], MD ? ? ?Brief Narrative:  ?This is a pleasant 56 year old lady with history of melanoma, paroxysmal SVT, thoracic aortic aneurysm and other comorbidities presented to emergency department after having outpatient ERCP and was admitted with acute pancreatitis in the ED.  GI is on board.  Lipase normal.  Upper GI series negative for any biliary leak or any duodenal leak. ? ?Assessment & Plan: ?  ?Principal Problem: ?  Post-ERCP acute pancreatitis ?Active Problems: ?  Choledocholithiasis ?  Paroxysmal SVT (supraventricular tachycardia) (HCC) ?  GERD without esophagitis ?  Acute pancreatitis ? ?Post ERCP acute pancreatitis: Patient still complains of pain however she appears to be very comfortable.  She has very mild epigastric and right upper quadrant tenderness.  Lipase normal.  She is still NPO.  Upper GI series negative for any leak.  In my opinion, it is time for her to be started on clear liquid diet however since GI is on board, I will defer to them.  She has also been started on antibiotics.  Defer to GI for that. ? ?GERD without esophagitis: Continue H2 blocker. ? ?Paroxysmal SVT: Continue home dose of metoprolol. ? ?DVT prophylaxis: enoxaparin (LOVENOX) injection 40 mg Start: 01/16/22 1000 ?  Code Status: Full Code  ?Family Communication:  None present at bedside.  Plan of care discussed with patient in length and he/she verbalized understanding and agreed with it. ? ?Status is: Inpatient ?Remains inpatient appropriate because: Still n.p.o. with abdominal pain. ? ? ?Estimated body mass index is 25.58 kg/m? as calculated from the following: ?  Height as of this encounter: '5\' 4"'$  (1.626 m). ?  Weight as of this encounter: 67.6 kg. ? ?  ?Nutritional Assessment: ?Body mass index is 25.58 kg/m?Marland KitchenMarland Kitchen ?Seen by dietician.  I agree with the assessment and plan as outlined below: ?Nutrition Status: ?  ?  ?   ? ?. ?Skin Assessment: ?I have examined the patient's skin and I agree with the wound assessment as performed by the wound care RN as outlined below: ?  ? ?Consultants:  ?GI ? ?Procedures:  ?None ? ?Antimicrobials:  ?Anti-infectives (From admission, onward)  ? ? Start     Dose/Rate Route Frequency Ordered Stop  ? 01/16/22 1000  cefTRIAXone (ROCEPHIN) 2 g in sodium chloride 0.9 % 100 mL IVPB       ? 2 g ?200 mL/hr over 30 Minutes Intravenous Every 24 hours 01/16/22 0934    ? 01/16/22 1000  metroNIDAZOLE (FLAGYL) IVPB 500 mg       ? 500 mg ?100 mL/hr over 60 Minutes Intravenous Every 12 hours 01/16/22 0934    ? ?  ?  ? ? ?Subjective: ?Seen and examined.  Still complains of abdominal pain which is similar to yesterday.  No other complaint. ? ?Objective: ?Vitals:  ? 01/16/22 1710 01/16/22 2055 01/17/22 0203 01/17/22 0444  ?BP:  100/69 (!) 100/59 (!) 100/58  ?Pulse: 85 79 65 (!) 57  ?Resp:  '20 20 20  '$ ?Temp: 100 ?F (37.8 ?C) 99.8 ?F (37.7 ?C) 99.5 ?F (37.5 ?C) 99 ?F (37.2 ?C)  ?TempSrc: Oral Oral Oral Oral  ?SpO2: 95% 94% 93% 94%  ?Weight:      ?Height:      ? ? ?Intake/Output Summary (Last 24 hours) at 01/17/2022 1123 ?Last data filed at 01/17/2022 0500 ?Gross per 24 hour  ?Intake 1683.21 ml  ?Output --  ?Net 1683.21 ml  ? ?  Filed Weights  ? 01/15/22 1949  ?Weight: 67.6 kg  ? ? ?Examination: ? ?General exam: Appears calm and comfortable  ?Respiratory system: Clear to auscultation. Respiratory effort normal. ?Cardiovascular system: S1 & S2 heard, RRR. No JVD, murmurs, rubs, gallops or clicks. No pedal edema. ?Gastrointestinal system: Abdomen is nondistended, soft and mild tenderness in the epigastrium and right upper quadrant region. No organomegaly or masses felt. Normal bowel sounds heard. ?Central nervous system: Alert and oriented. No focal neurological deficits. ?Extremities: Symmetric 5 x 5 power. ?Skin: No rashes, lesions or ulcers ?Psychiatry: Judgement and insight appear normal. Mood & affect appropriate.   ? ? ?Data Reviewed: I have personally reviewed following labs and imaging studies ? ?CBC: ?Recent Labs  ?Lab 01/15/22 ?2017 01/16/22 ?0720  ?WBC 7.4 10.9*  ?NEUTROABS  --  8.8*  ?HGB 14.0 11.1*  ?HCT 42.5 34.5*  ?MCV 89.7 91.0  ?PLT 265 210  ? ?Basic Metabolic Panel: ?Recent Labs  ?Lab 01/15/22 ?2017 01/16/22 ?0720 01/17/22 ?0336  ?NA 136 136 137  ?K 3.9 3.9 3.9  ?CL 102 108 107  ?CO2 '27 24 22  '$ ?GLUCOSE 163* 102* 137*  ?BUN '16 14 10  '$ ?CREATININE 0.71 0.55 0.60  ?CALCIUM 9.0 7.7* 8.3*  ?MG  --  2.1  --   ? ?GFR: ?Estimated Creatinine Clearance: 74.3 mL/min (by C-G formula based on SCr of 0.6 mg/dL). ?Liver Function Tests: ?Recent Labs  ?Lab 01/15/22 ?2017 01/16/22 ?0720 01/17/22 ?0336  ?AST 27 14* 13*  ?ALT '23 16 14  '$ ?ALKPHOS 62 46 46  ?BILITOT 0.6 0.7 0.7  ?PROT 7.2 5.5* 5.6*  ?ALBUMIN 4.3 3.3* 3.2*  ? ?Recent Labs  ?Lab 01/15/22 ?2017 01/16/22 ?0720  ?LIPASE 40 48  ? ?No results for input(s): AMMONIA in the last 168 hours. ?Coagulation Profile: ?No results for input(s): INR, PROTIME in the last 168 hours. ?Cardiac Enzymes: ?No results for input(s): CKTOTAL, CKMB, CKMBINDEX, TROPONINI in the last 168 hours. ?BNP (last 3 results) ?No results for input(s): PROBNP in the last 8760 hours. ?HbA1C: ?No results for input(s): HGBA1C in the last 72 hours. ?CBG: ?No results for input(s): GLUCAP in the last 168 hours. ?Lipid Profile: ?No results for input(s): CHOL, HDL, LDLCALC, TRIG, CHOLHDL, LDLDIRECT in the last 72 hours. ?Thyroid Function Tests: ?No results for input(s): TSH, T4TOTAL, FREET4, T3FREE, THYROIDAB in the last 72 hours. ?Anemia Panel: ?No results for input(s): VITAMINB12, FOLATE, FERRITIN, TIBC, IRON, RETICCTPCT in the last 72 hours. ?Sepsis Labs: ?No results for input(s): PROCALCITON, LATICACIDVEN in the last 168 hours. ? ?No results found for this or any previous visit (from the past 240 hour(s)).  ? ?Radiology Studies: ?DG Abd 1 View ? ?Result Date: 01/16/2022 ?CLINICAL DATA:  Epigastric pain EXAM: ABDOMEN -  1 VIEW COMPARISON:  Abdominal radiograph dated May 03, 2016 FINDINGS: Visualized bowel gas pattern is normal. Inferior pelvis is not included in the field of view. Cholecystectomy clips. No radio-opaque calculi or other significant radiographic abnormality are seen. IMPRESSION: Nonobstructive bowel gas pattern. Electronically Signed   By: Yetta Glassman M.D.   On: 01/16/2022 13:41  ? ?DG ERCP ? ?Result Date: 01/15/2022 ?CLINICAL DATA:  56 year old female undergoing ERCP, history of abdominal pain. EXAM: ERCP TECHNIQUE: Multiple spot images obtained with the fluoroscopic device and submitted for interpretation post-procedure. FLUOROSCOPY TIME:  Fluoroscopy Time:  42 seconds Radiation Exposure Index (if provided by the fluoroscopic device): 35.86 mGy Number of Acquired Spot Images: 14 COMPARISON:  MRCP from 12/16/2021 FINDINGS: Duodenal scope is positioned over the  second portion the duodenum. There is retrograde cannulation of the common bile duct and intrahepatic biliary tree. Retrograde cholangiogram demonstrates mild extrahepatic biliary ductal dilation. Evaluation of the mid and distal common bile duct is limited by overlying duodenal scope. Cholecystectomy clips are present. IMPRESSION: Retrograde cholangiogram demonstrating mild extrahepatic biliary ductal dilation with limited visualization of the mid to distal common bile duct. These images were submitted for radiologic interpretation only. Please see the procedural report for the full procedural details, amount of contrast, and the fluoroscopy time utilized. Electronically Signed   By: Ruthann Cancer M.D.   On: 01/15/2022 14:07  ? ?DG UGI W SINGLE CM (SOL OR THIN BA) ? ?Result Date: 01/17/2022 ?CLINICAL DATA:  Post ERCP with stone removal. Epigastric pain. Concern for duodenal leak. EXAM: UPPER GI SERIES WITH KUB TECHNIQUE: After obtaining a scout radiograph a routine upper GI series was performed using water-soluble contrast FLUOROSCOPY: Radiation  Exposure Index (as provided by the fluoroscopic device): 43.3 COMPARISON:  CT 01/16/2022, ERCP 01/15/2022 FINDINGS: Oral contrast flows freely through the esophagus into the stomach. Normal orientation of the stomach. [place]

## 2022-01-18 DIAGNOSIS — K9189 Other postprocedural complications and disorders of digestive system: Secondary | ICD-10-CM | POA: Diagnosis not present

## 2022-01-18 DIAGNOSIS — K859 Acute pancreatitis without necrosis or infection, unspecified: Secondary | ICD-10-CM | POA: Diagnosis not present

## 2022-01-18 LAB — COMPREHENSIVE METABOLIC PANEL
ALT: 11 U/L (ref 0–44)
AST: 11 U/L — ABNORMAL LOW (ref 15–41)
Albumin: 2.8 g/dL — ABNORMAL LOW (ref 3.5–5.0)
Alkaline Phosphatase: 41 U/L (ref 38–126)
Anion gap: 4 — ABNORMAL LOW (ref 5–15)
BUN: 12 mg/dL (ref 6–20)
CO2: 27 mmol/L (ref 22–32)
Calcium: 7.9 mg/dL — ABNORMAL LOW (ref 8.9–10.3)
Chloride: 107 mmol/L (ref 98–111)
Creatinine, Ser: 0.55 mg/dL (ref 0.44–1.00)
GFR, Estimated: >60 mL/min (ref >60)
Glucose, Bld: 119 mg/dL — ABNORMAL HIGH (ref 70–99)
Potassium: 3.3 mmol/L — ABNORMAL LOW (ref 3.5–5.1)
Sodium: 138 mmol/L (ref 135–145)
Total Bilirubin: 0.9 mg/dL (ref 0.3–1.2)
Total Protein: 5.4 g/dL — ABNORMAL LOW (ref 6.5–8.1)

## 2022-01-18 LAB — CBC
HCT: 35.6 % — ABNORMAL LOW (ref 36.0–46.0)
Hemoglobin: 11.6 g/dL — ABNORMAL LOW (ref 12.0–15.0)
MCH: 29.4 pg (ref 26.0–34.0)
MCHC: 32.6 g/dL (ref 30.0–36.0)
MCV: 90.1 fL (ref 80.0–100.0)
Platelets: 191 10*3/uL (ref 150–400)
RBC: 3.95 MIL/uL (ref 3.87–5.11)
RDW: 13.5 % (ref 11.5–15.5)
WBC: 11.9 10*3/uL — ABNORMAL HIGH (ref 4.0–10.5)
nRBC: 0 % (ref 0.0–0.2)

## 2022-01-18 LAB — C DIFFICILE (CDIFF) QUICK SCRN (NO PCR REFLEX)
C Diff antigen: NEGATIVE
C Diff interpretation: NOT DETECTED
C Diff toxin: NEGATIVE

## 2022-01-18 MED ORDER — POTASSIUM CHLORIDE CRYS ER 20 MEQ PO TBCR
40.0000 meq | EXTENDED_RELEASE_TABLET | Freq: Once | ORAL | Status: AC
  Administered 2022-01-18: 40 meq via ORAL
  Filled 2022-01-18: qty 2

## 2022-01-18 NOTE — Progress Notes (Addendum)
?   01/18/22 1501  ?Assess: MEWS Score  ?Temp (!) 101.4 ?F (38.6 ?C)  ?BP 116/69  ?Pulse Rate (!) 110  ?Resp 20  ?SpO2 95 %  ?O2 Device Room Air  ?Assess: MEWS Score  ?MEWS Temp 1  ?MEWS Systolic 0  ?MEWS Pulse 1  ?MEWS RR 0  ?MEWS LOC 0  ?MEWS Score 2  ?MEWS Score Color Yellow  ?Assess: if the MEWS score is Yellow or Red  ?Were vital signs taken at a resting state? Yes  ?Focused Assessment Change from prior assessment (see assessment flowsheet)  ?Does the patient meet 2 or more of the SIRS criteria? Yes  ?Does the patient have a confirmed or suspected source of infection? No  ?MEWS guidelines implemented *See Row Information* Yes  ?Treat  ?MEWS Interventions Administered prn meds/treatments  ?Take Vital Signs  ?Increase Vital Sign Frequency  Red: Q 1hr X 4 then Q 4hr X 4, if remains red, continue Q 4hrs  ?Escalate  ?MEWS: Escalate Yellow: discuss with charge nurse/RN and consider discussing with provider and RRT  ?Notify: Charge Nurse/RN  ?Name of Charge Nurse/RN Notified Marissa Long, CN  ?Date Charge Nurse/RN Notified 01/18/22  ?Time Charge Nurse/RN Notified 1550  ?Assess: SIRS CRITERIA  ?SIRS Temperature  1  ?SIRS Pulse 1  ?SIRS Respirations  0  ?SIRS WBC 0  ?SIRS Score Sum  2  ? ?Vitals freq is yellow Q2 hrs X2 ?

## 2022-01-18 NOTE — Progress Notes (Addendum)
?PROGRESS NOTE ? ? ? ?Laurie Terry  JQB:[id number] DOB: [birth year] DOA: 01/15/2022 ?PCP: [name], MD ? ? ?Brief Narrative:  ?This is a pleasant 56 year old lady with history of melanoma, paroxysmal SVT, thoracic aortic aneurysm and other comorbidities presented to emergency department after having outpatient ERCP and was admitted with acute pancreatitis in the ED.  GI is on board.  Lipase normal.  Upper GI series negative for any biliary leak or any duodenal leak. ? ?Assessment & Plan: ?  ?Principal Problem: ?  Post-ERCP acute pancreatitis ?Active Problems: ?  Choledocholithiasis ?  Paroxysmal SVT (supraventricular tachycardia) (HCC) ?  GERD without esophagitis ?  Acute pancreatitis ? ?Post ERCP acute pancreatitis: Pain improving.  Tolerated clear liquid but had some issues with up liquid diet.  GI seeing her.  Plan to advance diet to liquid diet and if she tolerates then perhaps soft diet either later today or tomorrow morning and potentially discharge this weekend. ? ?Diarrhea: Patient states that she had only one popsicle yesterday and she had 7 episodes of diarrhea.  Per GI, this is likely bile salt induced or antibiotic induced diarrhea.  C. difficile and GI pathogen panel ordered and pending. ? ?Hypokalemia: Replace. ? ?GERD without esophagitis: Continue H2 blocker. ? ?Paroxysmal SVT: Continue home dose of metoprolol. ? ?DVT prophylaxis: enoxaparin (LOVENOX) injection 40 mg Start: 01/16/22 1000 ?  Code Status: Full Code  ?Family Communication:  None present at bedside.  Plan of care discussed with patient in length and he/she verbalized understanding and agreed with it. ? ?Status is: Inpatient ?Remains inpatient appropriate because: Still n.p.o. with abdominal pain. ? ? ?Estimated body mass index is 25.58 kg/m? as calculated from the following: ?  Height as of this encounter: '5\' 4"'$  (1.626 m). ?  Weight as of this encounter: 67.6 kg. ? ?  ?Nutritional Assessment: ?Body mass index is 25.58  kg/m?Marland KitchenMarland Kitchen ?Seen by dietician.  I agree with the assessment and plan as outlined below: ?Nutrition Status: ?  ?  ?  ? ?. ?Skin Assessment: ?I have examined the patient's skin and I agree with the wound assessment as performed by the wound care RN as outlined below: ?  ? ?Consultants:  ?GI ? ?Procedures:  ?None ? ?Antimicrobials:  ?Anti-infectives (From admission, onward)  ? ? Start     Dose/Rate Route Frequency Ordered Stop  ? 01/16/22 1000  cefTRIAXone (ROCEPHIN) 2 g in sodium chloride 0.9 % 100 mL IVPB       ? 2 g ?200 mL/hr over 30 Minutes Intravenous Every 24 hours 01/16/22 0934    ? 01/16/22 1000  metroNIDAZOLE (FLAGYL) IVPB 500 mg       ? 500 mg ?100 mL/hr over 60 Minutes Intravenous Every 12 hours 01/16/22 0934    ? ?  ?  ? ? ?Subjective: ? ?Seen and examined.  [place] slightly better than yesterday.  No nausea now.  Did have some nausea yesterday with clears.  Also had 7 episodes of diarrhea according to patient. ? ?Objective: ?Vitals:  ? 01/17/22 0203 01/17/22 0444 01/17/22 1436 01/17/22 2104  ?BP: (!) 100/59 (!) 100/58 (!) 96/58 (!) 98/54  ?Pulse: 65 (!) 57 69 66  ?Resp: '20 20 20 20  '$ ?Temp: 99.5 ?F (37.5 ?C) 99 ?F (37.2 ?C) 98.8 ?F (37.1 ?C) 98.8 ?F (37.1 ?C)  ?TempSrc: Oral Oral Oral Oral  ?SpO2: 93% 94% 94% 97%  ?Weight:      ?Height:      ? ? ?Intake/Output Summary (Last 24 hours)  at 01/18/2022 1127 ?Last data filed at 01/17/2022 2133 ?Gross per 24 hour  ?Intake 1521.73 ml  ?Output --  ?Net 1521.73 ml  ? ? ?Filed Weights  ? 01/15/22 1949  ?Weight: 67.6 kg  ? ? ?Examination: ? ?General exam: Appears calm and comfortable  ?Respiratory system: Clear to auscultation. Respiratory effort normal. ?Cardiovascular system: S1 & S2 heard, RRR. No JVD, murmurs, rubs, gallops or clicks. No pedal edema. ?Gastrointestinal system: Abdomen is nondistended, soft and moderate tenderness in epigastric and right upper quadrant area.  No organomegaly or masses felt. Normal bowel sounds heard. ?Central nervous system: Alert and  oriented. No focal neurological deficits. ?Extremities: Symmetric 5 x 5 power. ?Skin: No rashes, lesions or ulcers.  ?Psychiatry: Judgement and insight appear normal. Mood & affect appropriate.  ? ?Data Reviewed: I have personally reviewed following labs and imaging studies ? ?CBC: ?Recent Labs  ?Lab 01/15/22 ?2017 01/16/22 ?0720 01/18/22 ?0336  ?WBC 7.4 10.9* 11.9*  ?NEUTROABS  --  8.8*  --   ?HGB 14.0 11.1* 11.6*  ?HCT 42.5 34.5* 35.6*  ?MCV 89.7 91.0 90.1  ?PLT 265 210 191  ? ? ?Basic Metabolic Panel: ?Recent Labs  ?Lab 01/15/22 ?2017 01/16/22 ?0720 01/17/22 ?9811 01/18/22 ?9147  ?NA 136 136 137 138  ?K 3.9 3.9 3.9 3.3*  ?CL 102 108 107 107  ?CO2 '27 24 22 27  '$ ?GLUCOSE 163* 102* 137* 119*  ?BUN '16 14 10 12  '$ ?CREATININE 0.71 0.55 0.60 0.55  ?CALCIUM 9.0 7.7* 8.3* 7.9*  ?MG  --  2.1  --   --   ? ? ?GFR: ?Estimated Creatinine Clearance: 74.3 mL/min (by C-G formula based on SCr of 0.55 mg/dL). ?Liver Function Tests: ?Recent Labs  ?Lab 01/15/22 ?2017 01/16/22 ?0720 01/17/22 ?8295 01/18/22 ?6213  ?AST 27 14* 13* 11*  ?ALT '23 16 14 11  '$ ?ALKPHOS 62 46 46 41  ?BILITOT 0.6 0.7 0.7 0.9  ?PROT 7.2 5.5* 5.6* 5.4*  ?ALBUMIN 4.3 3.3* 3.2* 2.8*  ? ? ?Recent Labs  ?Lab 01/15/22 ?2017 01/16/22 ?0720  ?LIPASE 40 48  ? ? ?No results for input(s): AMMONIA in the last 168 hours. ?Coagulation Profile: ?No results for input(s): INR, PROTIME in the last 168 hours. ?Cardiac Enzymes: ?No results for input(s): CKTOTAL, CKMB, CKMBINDEX, TROPONINI in the last 168 hours. ?BNP (last 3 results) ?No results for input(s): PROBNP in the last 8760 hours. ?HbA1C: ?No results for input(s): HGBA1C in the last 72 hours. ?CBG: ?No results for input(s): GLUCAP in the last 168 hours. ?Lipid Profile: ?No results for input(s): CHOL, HDL, LDLCALC, TRIG, CHOLHDL, LDLDIRECT in the last 72 hours. ?Thyroid Function Tests: ?No results for input(s): TSH, T4TOTAL, FREET4, T3FREE, THYROIDAB in the last 72 hours. ?Anemia Panel: ?No results for input(s): VITAMINB12,  FOLATE, FERRITIN, TIBC, IRON, RETICCTPCT in the last 72 hours. ?Sepsis Labs: ?No results for input(s): PROCALCITON, LATICACIDVEN in the last 168 hours. ? ?No results found for this or any previous visit (from the past 240 hour(s)).  ? ?Radiology Studies: ?DG Abd 1 View ? ?Result Date: 01/16/2022 ?CLINICAL DATA:  Epigastric pain EXAM: ABDOMEN - 1 VIEW COMPARISON:  Abdominal radiograph dated May 03, 2016 FINDINGS: Visualized bowel gas pattern is normal. Inferior pelvis is not included in the field of view. Cholecystectomy clips. No radio-opaque calculi or other significant radiographic abnormality are seen. IMPRESSION: Nonobstructive bowel gas pattern. Electronically Signed   By: Yetta Glassman M.D.   On: 01/16/2022 13:41  ? ?DG UGI W SINGLE CM (SOL OR THIN  BA) ? ?Result Date: 01/17/2022 ?CLINICAL DATA:  Post ERCP with stone removal. Epigastric pain. Concern for duodenal leak. EXAM: UPPER GI SERIES WITH KUB TECHNIQUE: After obtaining a scout radiograph a routine upper GI series was performed using water-soluble contrast FLUOROSCOPY: Radiation Exposure Index (as provided by the fluoroscopic device): 43.3 COMPARISON:  CT 01/16/2022, ERCP 01/15/2022 FINDINGS: Oral contrast flows freely through the esophagus into the stomach. Normal orientation of the stomach. Contrast flows into the first and second portion of the duodenum without evidence of leak. Contrast subsequently flows into the third and fourth portion the duodenum and proximal small bowel. No extraluminal contrast to suggest duodenal leak or injury. Contrast does reflux into the common bile duct and common hepatic duct. Contrast reflux into the cystic duct. Small gas bubbles noted in the common bile duct. No stones identified. Patient was pre-medicated for IV contrast allergy. No adverse reactions. IMPRESSION: 1. No evidence of duodenal leak or perforation. 2. Contrast refluxes into the common bile duct, common hepatic duct, and cystic duct. 3. Small gas  bubbles in the common bile duct. Electronically Signed   By: Suzy Bouchard M.D.   On: 01/17/2022 09:12   ? ?Scheduled Meds: ? enoxaparin (LOVENOX) injection  40 mg Subcutaneous Q24H  ? famotidine  20 mg Oral Daily

## 2022-01-18 NOTE — Progress Notes (Signed)
[facility] Progress Note ? ?Laurie Terry 56 y.o. 05-16-1966 ? ?CC: Post ERCP abdominal pain ? ?Subjective:  ?Patient seen and examined laying in bed.  She started having loose watery stools yesterday.  Reports 7 episodes of watery diarrhea yesterday after eating and ice pop.  Today she has had 1 bowel movement that was loose and watery.  She continues to have abdominal pain. ? ?ROS : Review of Systems  ?Constitutional:  Negative for chills and fever.  ?HENT:  Negative for hearing loss and tinnitus.   ?Eyes:  Negative for blurred vision and double vision.  ?Respiratory:  Negative for cough and hemoptysis.   ?Cardiovascular:  Negative for chest pain and palpitations.  ?Gastrointestinal:  Positive for abdominal pain and diarrhea. Negative for blood in stool, constipation, heartburn, melena, nausea and vomiting.  ?Genitourinary:  Negative for dysuria and frequency.  ?Musculoskeletal:  Negative for myalgias and neck pain.  ?Skin:  Negative for itching and rash.  ?Neurological:  Negative for dizziness and headaches.  ?Endo/Heme/Allergies:  Negative for environmental allergies. Does not bruise/bleed easily.  ?Psychiatric/Behavioral:  Negative for depression and suicidal ideas.    ? ? ?Objective: ?Vital signs in last 24 hours: ?Vitals:  ? 01/17/22 1436 01/17/22 2104  ?BP: (!) 96/58 (!) 98/54  ?Pulse: 69 66  ?Resp: 20 20  ?Temp: 98.8 ?F (37.1 ?C) 98.8 ?F (37.1 ?C)  ?SpO2: 94% 97%  ? ? ?Physical Exam: ? ?General:  Alert, cooperative, no distress, appears stated age  ?Head:  Normocephalic, without obvious abnormality, atraumatic  ?Eyes:  Anicteric sclera, EOM's intact  ?Lungs:   Clear to auscultation bilaterally, respirations unlabored  ?Heart:  Regular rate and rhythm, S1, S2 normal  ?Abdomen:   Soft, tender to palpation of the epigastric and right upper quadrant regions, no guarding or rebound, bowel sounds active all four quadrants,  no masses,   ?Extremities: Extremities normal, atraumatic, no  edema   ?Pulses: 2+ and symmetric  ? ? ?Lab Results: ?Recent Labs  ?  01/16/22 ?0720 01/17/22 ?0336 01/18/22 ?0336  ?NA 136 137 138  ?K 3.9 3.9 3.3*  ?CL 108 107 107  ?CO2 '24 22 27  '$ ?GLUCOSE 102* 137* 119*  ?BUN '14 10 12  '$ ?CREATININE 0.55 0.60 0.55  ?CALCIUM 7.7* 8.3* 7.9*  ?MG 2.1  --   --   ? ?Recent Labs  ?  01/17/22 ?0336 01/18/22 ?0336  ?AST 13* 11*  ?ALT 14 11  ?ALKPHOS 46 41  ?BILITOT 0.7 0.9  ?PROT 5.6* 5.4*  ?ALBUMIN 3.2* 2.8*  ? ?Recent Labs  ?  01/16/22 ?0720 01/18/22 ?0336  ?WBC 10.9* 11.9*  ?NEUTROABS 8.8*  --   ?HGB 11.1* 11.6*  ?HCT 34.5* 35.6*  ?MCV 91.0 90.1  ?PLT 210 191  ? ?No results for input(s): LABPROT, INR in the last 72 hours. ? ? ? ?Assessment ?Worsening abdominal pain after ERCP with sphincterotomy. ?  ?CT chest AP w/o contrast 4/25 ?Intra and extrahepatic pneumobilia probably from sphincterotomy. Also showed small foci of gas within hepatic hilum which could represent gas within the remnant of cystic duct and less likely an extraluminal gas.  CT scan also showed changes of pancreatitis. ?  ?Upper GI series 01/17/2022 ?1. No evidence of duodenal leak or perforation. ?2. Contrast refluxes into the common bile duct, common hepatic duct, ?and cystic duct. ?3. Small gas bubbles in the common bile duct. ?  ?Today patient has mild leukocytosis at 10.9, no documented fevers, no peritoneal signs, will continue to monitor closely,  patient is already on antibiotics. ?  ? ? ?Plan: ?Continue supportive care ?Continue ceftriaxone 2 g IV every 24 hours and metronidazole IV 500 mg every 12 hours. ?Continue to monitor for peritoneal signs, fever, significant leukocytosis, no signs of leakage or perforation on GI series. ?Full liquid diet today. ?We will evaluate for etiology of diarrhea with GI pathogen panel and C. difficile screen.  ?[facility] will follow ? ?Charlott Rakes PA-C ?01/18/2022, 10:54 AM ? ?Contact #  [phone]  ?

## 2022-01-19 DIAGNOSIS — K859 Acute pancreatitis without necrosis or infection, unspecified: Secondary | ICD-10-CM | POA: Diagnosis not present

## 2022-01-19 DIAGNOSIS — K9189 Other postprocedural complications and disorders of digestive system: Secondary | ICD-10-CM | POA: Diagnosis not present

## 2022-01-19 LAB — COMPREHENSIVE METABOLIC PANEL
ALT: 15 U/L (ref 0–44)
AST: 17 U/L (ref 15–41)
Albumin: 3 g/dL — ABNORMAL LOW (ref 3.5–5.0)
Alkaline Phosphatase: 56 U/L (ref 38–126)
Anion gap: 8 (ref 5–15)
BUN: 8 mg/dL (ref 6–20)
CO2: 26 mmol/L (ref 22–32)
Calcium: 8 mg/dL — ABNORMAL LOW (ref 8.9–10.3)
Chloride: 104 mmol/L (ref 98–111)
Creatinine, Ser: 0.62 mg/dL (ref 0.44–1.00)
GFR, Estimated: >60 mL/min (ref >60)
Glucose, Bld: 106 mg/dL — ABNORMAL HIGH (ref 70–99)
Potassium: 2.9 mmol/L — ABNORMAL LOW (ref 3.5–5.1)
Sodium: 138 mmol/L (ref 135–145)
Total Bilirubin: 0.6 mg/dL (ref 0.3–1.2)
Total Protein: 5.9 g/dL — ABNORMAL LOW (ref 6.5–8.1)

## 2022-01-19 LAB — GASTROINTESTINAL PANEL BY PCR, STOOL (REPLACES STOOL CULTURE)

## 2022-01-19 MED ORDER — COLESTIPOL HCL 1 G PO TABS
2.0000 g | ORAL_TABLET | Freq: Two times a day (BID) | ORAL | Status: DC
  Administered 2022-01-19: 2 g via ORAL
  Filled 2022-01-19 (×5): qty 2

## 2022-01-19 MED ORDER — DIPHENHYDRAMINE HCL 50 MG/ML IJ SOLN
12.5000 mg | Freq: Four times a day (QID) | INTRAMUSCULAR | Status: DC | PRN
  Administered 2022-01-19 – 2022-01-23 (×5): 12.5 mg via INTRAVENOUS
  Filled 2022-01-19 (×6): qty 1
  Filled 2022-01-19: qty 0.25

## 2022-01-19 NOTE — TOC Initial Note (Signed)
Transition of Care (TOC) - Initial/Assessment Note  ? ? ?Patient Details  ?Name: Laurie Terry ?MRN: [id number] ?Date of Birth: March 22, 1966 ? ?Transition of Care (TOC) CM/SW Contact:    ?Tawanna Cooler, RN ?Phone Number: ?01/19/2022, 5:17 PM ? ?Clinical Narrative:                 ? ? ?Transition of Care Department (TOC) has reviewed patient and no TOC needs have been identified at this time. We will continue to monitor patient advancement through interdisciplinary progression rounds. If new patient transition needs arise, please place a TOC consult. ? ? ? ?Expected Discharge Plan: Home/Self Care ?Barriers to Discharge: Continued Medical Work up ? ? ?Expected Discharge Plan and Services ?Expected Discharge Plan: Home/Self Care ?  ?  ?  ?Living arrangements for the past 2 months: [place] ?                ?  ?Prior Living Arrangements/Services ?Living arrangements for the past 2 months: [place] ?Lives with:: Spouse ?  ?       ?  ?  ?Activities of Daily Living ?Home Assistive Devices/Equipment: Eyeglasses ?ADL Screening (condition at time of admission) ?Patient's cognitive ability adequate to safely complete daily activities?: Yes ?Is the patient deaf or have difficulty hearing?: No ?Does the patient have difficulty seeing, even when wearing glasses/contacts?: No ?Does the patient have difficulty concentrating, remembering, or making decisions?: No ?Patient able to express need for assistance with ADLs?: Yes ?Does the patient have difficulty dressing or bathing?: No ?Independently performs ADLs?: Yes (appropriate for developmental age) ?Does the patient have difficulty walking or climbing stairs?: No ?Weakness of Legs: None ?Weakness of Arms/Hands: None ? ?Emotional Assessment ?  ?Orientation: : Oriented to Self, Oriented to Place, Oriented to  Time, Oriented to Situation ?Alcohol / Substance Use: Not Applicable ?Psych Involvement: No (comment) ? ?Admission diagnosis:  Epigastric pain  [R10.13] ?Acute pancreatitis [K85.90] ?Patient Active Problem List  ? Diagnosis Date Noted  ? Post-ERCP acute pancreatitis 01/16/2022  ? GERD without esophagitis 01/16/2022  ? Choledocholithiasis 01/16/2022  ? Acute pancreatitis 01/16/2022  ? Adverse effect of other drugs, medicaments and biological substances, subsequent encounter 05/23/2021  ? Other allergic rhinitis 05/23/2021  ? Allergic conjunctivitis of both eyes 05/23/2021  ? History of asthma 05/23/2021  ? Oral allergy syndrome, subsequent encounter 05/23/2021  ? Family history of bicuspid aortic valve 11/28/2020  ? Other hyperlipidemia 11/28/2020  ? Chest pain of uncertain etiology 63/78/5885  ? Thoracic aortic aneurysm without rupture ([place]) 10/30/2020  ? PAC (premature atrial contraction) 10/30/2020  ? Postoperative bile leak 04/12/2016  ? Acute hypokalemia 04/12/2016  ? Paroxysmal SVT (supraventricular tachycardia) ([place]) 04/12/2016  ? Bile leak, postoperative 04/12/2016  ? S/P laparoscopic cholecystectomy   ? History of Gallstone pancreatitis 04/07/2016  ? Pancreatitis due to biliary obstruction 04/07/2016  ? Cancer [facility])   ? Osteopenia   ? ?PCP:  [name], MD ?Pharmacy:   ?[facility] [place] ?[place] ?[place] [address] ?Phone: [phone] Fax: [phone] ? ?[place] ?[place] ?[place] [address] ?Phone: [phone] Fax: [phone] ? ? ? ?

## 2022-01-19 NOTE — Progress Notes (Signed)
[facility] Progress Note ? ?Ronasia Isola Bienaime 56 y.o. 1965/10/15 ? ? ?Subjective: ?Sitting in bedside chair frustrated because of recurrent diarrhea. Brown loose stool seen on bathroom floor and towels. Reports diarrhea as soon as she eats something. Denies abdominal pain/N/V.  ? ?Objective: ?Vital signs: ?Vitals:  ? 01/19/22 0354 01/19/22 0956  ?BP: 107/66 104/63  ?Pulse: 91 87  ?Resp: (!) 22   ?Temp: 99.5 ?F (37.5 ?C) 98.7 ?F (37.1 ?C)  ?SpO2: 93% 95%  ? ? ?Physical Exam: ?Gen: lethargic, well-nourished, no acute distress  ?HEENT: anicteric sclera ?CV: RRR ?Chest: CTA B ?Abd: diffusely tender with guarding, soft, nondistended, +BS ?Ext: no edema ? ?Lab Results: ?Recent Labs  ?  01/17/22 ?0336 01/18/22 ?0336  ?NA 137 138  ?K 3.9 3.3*  ?CL 107 107  ?CO2 22 27  ?GLUCOSE 137* 119*  ?BUN 10 12  ?CREATININE 0.60 0.55  ?CALCIUM 8.3* 7.9*  ? ?Recent Labs  ?  01/17/22 ?0336 01/18/22 ?0336  ?AST 13* 11*  ?ALT 14 11  ?ALKPHOS 46 41  ?BILITOT 0.7 0.9  ?PROT 5.6* 5.4*  ?ALBUMIN 3.2* 2.8*  ? ?Recent Labs  ?  01/18/22 ?0336  ?WBC 11.9*  ?HGB 11.6*  ?HCT 35.6*  ?MCV 90.1  ?PLT 191  ? ? ? ? ?Assessment/Plan: ?Watery diarrhea - C. Diff negative. GI pathogen panel pending. Doubt infectious source. Excessive bile acids likely and will start Colestid 2 g PO BID. If stool studies negative, then start Imodium prn. Changed to clear liquid diet. Supportive care. Will f/u. ? ? ?Lear Ng ?01/19/2022, 12:24 PM ? ?Questions please call [phone] Patient ID: ZUHA DEJONGE, female   DOB: 07-21-1966, 56 y.o.   MRN: [id number] ? ?

## 2022-01-19 NOTE — Plan of Care (Signed)
?  Problem: Elimination: ?Goal: Will not experience complications related to bowel motility ?Outcome: Not Progressing ?  ?Problem: Health Behavior/Discharge Planning: ?Goal: Ability to manage health-related needs will improve ?Outcome: Progressing ?  ?Problem: Clinical Measurements: ?Goal: Ability to maintain clinical measurements within normal limits will improve ?Outcome: Progressing ?Goal: Will remain free from infection ?Outcome: Progressing ?Goal: Diagnostic test results will improve ?Outcome: Progressing ?  ?Problem: Activity: ?Goal: Risk for activity intolerance will decrease ?Outcome: Progressing ?  ?Problem: Nutrition: ?Goal: Adequate nutrition will be maintained ?Outcome: Progressing ?  ?Problem: Coping: ?Goal: Level of anxiety will decrease ?Outcome: Progressing ?  ?Problem: Pain Managment: ?Goal: General experience of comfort will improve ?Outcome: Progressing ?  ?Problem: Safety: ?Goal: Ability to remain free from injury will improve ?Outcome: Progressing ?  ?

## 2022-01-19 NOTE — Progress Notes (Signed)
Pt reported that within 5 minutes of eating breakfast, approximately 75%, she had an incontinent episode of diarrhea. Dr. Doristine Bosworth made aware.  ?

## 2022-01-19 NOTE — Progress Notes (Signed)
?PROGRESS NOTE ? ? ? ?Laurie Terry  ZOX:[id number] DOB: 10-02-1965 DOA: 01/15/2022 ?PCP: [name], MD ? ? ?Brief Narrative:  ?This is a pleasant 56 year old lady with history of melanoma, paroxysmal SVT, thoracic aortic aneurysm and other comorbidities presented to emergency department after having outpatient ERCP and was admitted with acute pancreatitis in the ED.  GI is on board.  Lipase normal.  Upper GI series negative for any biliary leak or any duodenal leak. ? ?Assessment & Plan: ?  ?Principal Problem: ?  Post-ERCP acute pancreatitis ?Active Problems: ?  Choledocholithiasis ?  Paroxysmal SVT (supraventricular tachycardia) (HCC) ?  GERD without esophagitis ?  Acute pancreatitis ? ?Post ERCP acute pancreatitis: Pain improving.  Tolerated clear liquid but had some issues with up liquid diet.  GI seeing her.  Tolerating full liquid diet.  Will advance to soft diet, patient recommended.  The only complaint she has now is that she has diarrhea every time she is eating him and 1 bite of anything.  She states that her pain and tenderness is improved and she has no nausea.  She also had low-grade fever yesterday.  We will watch, if recurrent fever, will consider repeating CT abdomen.  Check CBC tomorrow morning. ? ?Diarrhea: Patient states that she had only one popsicle yesterday and she had 7 episodes of diarrhea.  Per GI, this is likely bile salt induced or antibiotic induced diarrhea.  C. difficile negative, GI pathogen panel pending.  Will defer to GI for this. ? ?Hypokalemia: Resolved.  We will recheck labs. ? ?GERD without esophagitis: Continue H2 blocker. ? ?Paroxysmal SVT: Continue home dose of metoprolol. ? ?DVT prophylaxis: enoxaparin (LOVENOX) injection 40 mg Start: 01/16/22 1000 ?  Code Status: Full Code  ?Family Communication:  None present at bedside.  Plan of care discussed with patient in length and he/she verbalized understanding and agreed with it. ? ?Status is: Inpatient ?Remains  inpatient appropriate because: Still n.p.o. with abdominal pain. ? ? ?Estimated body mass index is 25.58 kg/m? as calculated from the following: ?  Height as of this encounter: '5\' 4"'$  (1.626 m). ?  Weight as of this encounter: 67.6 kg. ? ?  ?Nutritional Assessment: ?Body mass index is 25.58 kg/m?Marland KitchenMarland Kitchen ?Seen by dietician.  I agree with the assessment and plan as outlined below: ?Nutrition Status: ?  ?  ?  ? ?. ?Skin Assessment: ?I have examined the patient's skin and I agree with the wound assessment as performed by the wound care RN as outlined below: ?  ? ?Consultants:  ?GI ? ?Procedures:  ?None ? ?Antimicrobials:  ?Anti-infectives (From admission, onward)  ? ? Start     Dose/Rate Route Frequency Ordered Stop  ? 01/16/22 1000  cefTRIAXone (ROCEPHIN) 2 g in sodium chloride 0.9 % 100 mL IVPB       ? 2 g ?200 mL/hr over 30 Minutes Intravenous Every 24 hours 01/16/22 0934    ? 01/16/22 1000  metroNIDAZOLE (FLAGYL) IVPB 500 mg       ? 500 mg ?100 mL/hr over 60 Minutes Intravenous Every 12 hours 01/16/22 0934    ? ?  ?  ? ? ?Subjective: ? ?Seen and examined.  No new complaint now she has is diarrhea after eating anything.  Abdominal pain and tenderness is not currently no nausea. ? ?Objective: ?Vitals:  ? 01/18/22 2210 01/18/22 2315 01/19/22 0354 01/19/22 0956  ?BP:   107/66 104/63  ?Pulse:   91 87  ?Resp:   (!) 22   ?Temp: Marland Kitchen)  101.4 ?F (38.6 ?C) 99.8 ?F (37.7 ?C) 99.5 ?F (37.5 ?C) 98.7 ?F (37.1 ?C)  ?TempSrc: Oral Oral Oral Oral  ?SpO2:   93% 95%  ?Weight:      ?Height:      ? ? ?Intake/Output Summary (Last 24 hours) at 01/19/2022 1156 ?Last data filed at 01/19/2022 1000 ?Gross per 24 hour  ?Intake 5741.72 ml  ?Output --  ?Net 5741.72 ml  ? ? ?Filed Weights  ? 01/15/22 1949  ?Weight: 67.6 kg  ? ? ?Examination: ? ?General exam: Appears calm and comfortable  ?Respiratory system: Clear to auscultation. Respiratory effort normal. ?Cardiovascular system: S1 & S2 heard, RRR. No JVD, murmurs, rubs, gallops or clicks. No pedal  edema. ?Gastrointestinal system: Abdomen is nondistended, soft and very minimal right lower quadrant tenderness. No organomegaly or masses felt. Normal bowel sounds heard. ?Central nervous system: Alert and oriented. No focal neurological deficits. ?Extremities: Symmetric 5 x 5 power. ?Skin: No rashes, lesions or ulcers.  ?Psychiatry: Judgement and insight appear normal. Mood & affect appropriate.  ? ? ?Data Reviewed: I have personally reviewed following labs and imaging studies ? ?CBC: ?Recent Labs  ?Lab 01/15/22 ?2017 01/16/22 ?0720 01/18/22 ?0336  ?WBC 7.4 10.9* 11.9*  ?NEUTROABS  --  8.8*  --   ?HGB 14.0 11.1* 11.6*  ?HCT 42.5 34.5* 35.6*  ?MCV 89.7 91.0 90.1  ?PLT 265 210 191  ? ? ?Basic Metabolic Panel: ?Recent Labs  ?Lab 01/15/22 ?2017 01/16/22 ?0720 01/17/22 ?2229 01/18/22 ?7989  ?NA 136 136 137 138  ?K 3.9 3.9 3.9 3.3*  ?CL 102 108 107 107  ?CO2 '27 24 22 27  '$ ?GLUCOSE 163* 102* 137* 119*  ?BUN '16 14 10 12  '$ ?CREATININE 0.71 0.55 0.60 0.55  ?CALCIUM 9.0 7.7* 8.3* 7.9*  ?MG  --  2.1  --   --   ? ? ?GFR: ?Estimated Creatinine Clearance: 74.3 mL/min (by C-G formula based on SCr of 0.55 mg/dL). ?Liver Function Tests: ?Recent Labs  ?Lab 01/15/22 ?2017 01/16/22 ?0720 01/17/22 ?2119 01/18/22 ?4174  ?AST 27 14* 13* 11*  ?ALT '23 16 14 11  '$ ?ALKPHOS 62 46 46 41  ?BILITOT 0.6 0.7 0.7 0.9  ?PROT 7.2 5.5* 5.6* 5.4*  ?ALBUMIN 4.3 3.3* 3.2* 2.8*  ? ? ?Recent Labs  ?Lab 01/15/22 ?2017 01/16/22 ?0720  ?LIPASE 40 48  ? ? ?No results for input(s): AMMONIA in the last 168 hours. ?Coagulation Profile: ?No results for input(s): INR, PROTIME in the last 168 hours. ?Cardiac Enzymes: ?No results for input(s): CKTOTAL, CKMB, CKMBINDEX, TROPONINI in the last 168 hours. ?BNP (last 3 results) ?No results for input(s): PROBNP in the last 8760 hours. ?HbA1C: ?No results for input(s): HGBA1C in the last 72 hours. ?CBG: ?No results for input(s): GLUCAP in the last 168 hours. ?Lipid Profile: ?No results for input(s): CHOL, HDL, LDLCALC, TRIG,  CHOLHDL, LDLDIRECT in the last 72 hours. ?Thyroid Function Tests: ?No results for input(s): TSH, T4TOTAL, FREET4, T3FREE, THYROIDAB in the last 72 hours. ?Anemia Panel: ?No results for input(s): VITAMINB12, FOLATE, FERRITIN, TIBC, IRON, RETICCTPCT in the last 72 hours. ?Sepsis Labs: ?No results for input(s): PROCALCITON, LATICACIDVEN in the last 168 hours. ? ?Recent Results (from the past 240 hour(s))  ?C Difficile Quick Screen (NO PCR Reflex)     Status: None  ? Collection Time: 01/18/22  1:28 PM  ? Specimen: Stool  ?Result Value Ref Range Status  ? C Diff antigen NEGATIVE NEGATIVE Final  ? C Diff toxin NEGATIVE NEGATIVE Final  ?  C Diff interpretation No C. difficile detected.  Final  ?  Comment: Performed at [facility], [place] [address] [place], [place] [address]  ?  ? ?Radiology Studies: ?No results found. ? ?Scheduled Meds: ? enoxaparin (LOVENOX) injection  40 mg Subcutaneous Q24H  ? famotidine  20 mg Oral Daily  ? ?Continuous Infusions: ? cefTRIAXone (ROCEPHIN)  IV 2 g (01/19/22 0912)  ? lactated ringers 150 mL/hr at 01/19/22 0425  ? metronidazole 500 mg (01/19/22 0913)  ? ? ? LOS: 3 days  ? ?Time spent: 25 minutes ? ?Darliss Cheney, MD ?[facility] ? ?01/19/2022, 11:56 AM  ? ?*Please note that this is a verbal dictation therefore any spelling or grammatical errors are due to the "[place] [employer]" system interpretation. ? ?Please page via [place] and do not message via secure chat for urgent patient care matters. Secure chat can be used for non urgent patient care matters. ? ?How to contact the [facility] Attending or Consulting provider [place] or covering provider during after hours [place], for this patient?  ?Check the care team in [facility] and look for a) attending/consulting [facility] provider listed and b) the [facility] team listed. Page or secure chat [phone]. ?Log into [email or web address] and use [place]'s universal password to access. If you do not have the password, please contact the hospital  operator. ?Locate the [facility] provider you are looking for under [facility] and page to a number that you can be directly reached. ?If you still have difficulty reaching the provider, please page the [facility] (Director on Call

## 2022-01-19 NOTE — Plan of Care (Signed)
?  Problem: Activity: ?Goal: Risk for activity intolerance will decrease ?Outcome: Progressing ?  ?Problem: Nutrition: ?Goal: Adequate nutrition will be maintained ?Outcome: Progressing ?  ?Problem: Coping: ?Goal: Level of anxiety will decrease ?Outcome: Progressing ?  ?Problem: Pain Managment: ?Goal: General experience of comfort will improve ?Outcome: Progressing ?  ?Problem: Safety: ?Goal: Ability to remain free from injury will improve ?Outcome: Progressing ?  ?Problem: Elimination: ?Goal: Will not experience complications related to bowel motility ?Outcome: Not Progressing ?  ?Problem: Education: ?Goal: Knowledge of General Education information will improve ?Description: Including pain rating scale, medication(s)/side effects and non-pharmacologic comfort measures ?Outcome: Completed/Met ?  ?Problem: Clinical Measurements: ?Goal: Respiratory complications will improve ?Outcome: Completed/Met ?Goal: Cardiovascular complication will be avoided ?Outcome: Completed/Met ?  ?Problem: Elimination: ?Goal: Will not experience complications related to urinary retention ?Outcome: Completed/Met ?  ?Problem: Skin Integrity: ?Goal: Risk for impaired skin integrity will decrease ?Outcome: Completed/Met ?  ?

## 2022-01-19 NOTE — Progress Notes (Signed)
?   01/19/22 1745  ?Provider Notification  ?Provider Name/Title Dr. Doristine Bosworth  ?Date Provider Notified 01/19/22  ?Time Provider Notified 1745  ?Method of Notification Page  ?Notification Reason Change in status  ?Provider response See new orders  ?Date of Provider Response 01/19/22  ?Time of Provider Response 1745  ? ?Pt c/o itching to cheeks and chest, and tightness in throat. States "it seems a little harder to swallow." Does not appear to be in any distress. Speaking in full sentences.  Feels as if this could be related to receiving first dose of Colestid. Dr. Doristine Bosworth paged and made aware. New order received for IV Benadryl.  ?

## 2022-01-20 ENCOUNTER — Inpatient Hospital Stay (HOSPITAL_COMMUNITY): Payer: 59

## 2022-01-20 DIAGNOSIS — K859 Acute pancreatitis without necrosis or infection, unspecified: Secondary | ICD-10-CM | POA: Diagnosis not present

## 2022-01-20 DIAGNOSIS — K9189 Other postprocedural complications and disorders of digestive system: Secondary | ICD-10-CM | POA: Diagnosis not present

## 2022-01-20 LAB — CBC WITH DIFFERENTIAL/PLATELET
Abs Immature Granulocytes: 0.04 10*3/uL (ref 0.00–0.07)
Basophils Absolute: 0 10*3/uL (ref 0.0–0.1)
Basophils Relative: 0 %
Eosinophils Absolute: 0.2 10*3/uL (ref 0.0–0.5)
Eosinophils Relative: 2 %
HCT: 30.8 % — ABNORMAL LOW (ref 36.0–46.0)
Hemoglobin: 10.2 g/dL — ABNORMAL LOW (ref 12.0–15.0)
Immature Granulocytes: 0 %
Lymphocytes Relative: 8 %
Lymphs Abs: 0.8 10*3/uL (ref 0.7–4.0)
MCH: 29.7 pg (ref 26.0–34.0)
MCHC: 33.1 g/dL (ref 30.0–36.0)
MCV: 89.5 fL (ref 80.0–100.0)
Monocytes Absolute: 0.9 10*3/uL (ref 0.1–1.0)
Monocytes Relative: 9 %
Neutro Abs: 8.4 10*3/uL — ABNORMAL HIGH (ref 1.7–7.7)
Neutrophils Relative %: 81 %
Platelets: 202 10*3/uL (ref 150–400)
RBC: 3.44 MIL/uL — ABNORMAL LOW (ref 3.87–5.11)
RDW: 13.5 % (ref 11.5–15.5)
WBC: 10.3 10*3/uL (ref 4.0–10.5)
nRBC: 0 % (ref 0.0–0.2)

## 2022-01-20 LAB — BASIC METABOLIC PANEL
Anion gap: 4 — ABNORMAL LOW (ref 5–15)
BUN: 6 mg/dL (ref 6–20)
CO2: 26 mmol/L (ref 22–32)
Calcium: 7.6 mg/dL — ABNORMAL LOW (ref 8.9–10.3)
Chloride: 109 mmol/L (ref 98–111)
Creatinine, Ser: 0.51 mg/dL (ref 0.44–1.00)
GFR, Estimated: >60 mL/min (ref >60)
Glucose, Bld: 83 mg/dL (ref 70–99)
Potassium: 3.1 mmol/L — ABNORMAL LOW (ref 3.5–5.1)
Sodium: 139 mmol/L (ref 135–145)

## 2022-01-20 MED ORDER — POTASSIUM CHLORIDE CRYS ER 20 MEQ PO TBCR
40.0000 meq | EXTENDED_RELEASE_TABLET | ORAL | Status: AC
  Administered 2022-01-20 (×2): 40 meq via ORAL
  Filled 2022-01-20 (×2): qty 2

## 2022-01-20 MED ORDER — LOPERAMIDE HCL 2 MG PO CAPS
2.0000 mg | ORAL_CAPSULE | ORAL | Status: DC | PRN
  Administered 2022-01-20 – 2022-01-21 (×6): 2 mg via ORAL
  Filled 2022-01-20 (×6): qty 1

## 2022-01-20 NOTE — Progress Notes (Signed)
PROGRESS NOTE    Laurie Terry  ZOX:[id number] DOB: 26-May-1966 DOA: 01/15/2022 PCP: [name], MD   Brief Narrative:  This is a pleasant 56 year old lady with history of melanoma, paroxysmal SVT, thoracic aortic aneurysm and other comorbidities presented to emergency department after having outpatient ERCP and was admitted with acute pancreatitis in the ED.  GI is on board.  Lipase normal.  Upper GI series negative for any biliary leak or any duodenal leak.  Assessment & Plan:   Principal Problem:   Post-ERCP acute pancreatitis Active Problems:   Choledocholithiasis   Paroxysmal SVT (supraventricular tachycardia) (HCC)   GERD without esophagitis   Acute pancreatitis  Post ERCP acute pancreatitis: Pain improving.  Still with intermittent nausea.  Her major complaint at this point time is diarrhea.  She has been having unlimited number of episodes of watery diarrhea.  GI pathogen panel is also negative along with C. difficile.  Per GI recommendations, I will start her on Imodium.  Regarding diet, I will defer to GI.  Diarrhea: Plan as above.  Hypokalemia: Was low again yesterday.  We will recheck now.  Replace if needed.  Most likely she will be low due to diarrhea.  GERD without esophagitis: Continue H2 blocker.  Paroxysmal SVT: Continue home dose of metoprolol.  DVT prophylaxis: enoxaparin (LOVENOX) injection 40 mg Start: 01/16/22 1000   Code Status: Full Code  Family Communication:  None present at bedside.  Plan of care discussed with patient in length and he/she verbalized understanding and agreed with it.  Status is: Inpatient Remains inpatient appropriate because: Still n.p.o. with abdominal pain.   Estimated body mass index is 25.58 kg/m as calculated from the following:   Height as of this encounter: 5\' 4"  (1.626 m).   Weight as of this encounter: 67.6 kg.    Nutritional Assessment: Body mass index is 25.58 kg/m.Marland Kitchen Seen by dietician.  I agree with the  assessment and plan as outlined below: Nutrition Status:        . Skin Assessment: I have examined the patient's skin and I agree with the wound assessment as performed by the wound care RN as outlined below:    Consultants:  GI  Procedures:  None  Antimicrobials:  Anti-infectives (From admission, onward)    Start     Dose/Rate Route Frequency Ordered Stop   01/16/22 1000  cefTRIAXone (ROCEPHIN) 2 g in sodium chloride 0.9 % 100 mL IVPB        2 g 200 mL/hr over 30 Minutes Intravenous Every 24 hours 01/16/22 0934     01/16/22 1000  metroNIDAZOLE (FLAGYL) IVPB 500 mg        500 mg 100 mL/hr over 60 Minutes Intravenous Every 12 hours 01/16/22 0934           Subjective:  Seen and examined.  Pain is improving but now she once again has intermittent nausea.  Her major complaint is diarrhea.  Objective: Vitals:   01/19/22 1520 01/19/22 1813 01/19/22 2047 01/20/22 0605  BP: 97/62  106/62 (!) 108/58  Pulse: 95  87 80  Resp: 14  17 18   Temp: 99.8 F (37.7 C) 99.9 F (37.7 C) 100.3 F (37.9 C) 99 F (37.2 C)  TempSrc: Oral Oral Oral Oral  SpO2: 95%  95% 95%  Weight:      Height:        Intake/Output Summary (Last 24 hours) at 01/20/2022 1138 Last data filed at 01/20/2022 1000 Gross per 24 hour  Intake  3357.46 ml  Output --  Net 3357.46 ml    Filed Weights   01/15/22 1949  Weight: 67.6 kg    Examination:  General exam: Appears calm and comfortable  Respiratory system: Clear to auscultation. Respiratory effort normal. Cardiovascular system: S1 & S2 heard, RRR. No JVD, murmurs, rubs, gallops or clicks. No pedal edema. Gastrointestinal system: Abdomen is nondistended, soft and nontender. No organomegaly or masses felt. Normal bowel sounds heard. Central nervous system: Alert and oriented. No focal neurological deficits. Extremities: Symmetric 5 x 5 power. Skin: No rashes, lesions or ulcers.  Psychiatry: Judgement and insight appear normal. Mood & affect  appropriate.   Data Reviewed: I have personally reviewed following labs and imaging studies  CBC: Recent Labs  Lab 01/15/22 2017 01/16/22 0720 01/18/22 0336 01/20/22 0330  WBC 7.4 10.9* 11.9* 10.3  NEUTROABS  --  8.8*  --  8.4*  HGB 14.0 11.1* 11.6* 10.2*  HCT 42.5 34.5* 35.6* 30.8*  MCV 89.7 91.0 90.1 89.5  PLT 265 210 191 202    Basic Metabolic Panel: Recent Labs  Lab 01/15/22 2017 01/16/22 0720 01/17/22 0336 01/18/22 0336 01/19/22 1225  NA 136 136 137 138 138  K 3.9 3.9 3.9 3.3* 2.9*  CL 102 108 107 107 104  CO2 27 24 22 27 26   GLUCOSE 163* 102* 137* 119* 106*  BUN 16 14 10 12 8   CREATININE 0.71 0.55 0.60 0.55 0.62  CALCIUM 9.0 7.7* 8.3* 7.9* 8.0*  MG  --  2.1  --   --   --     GFR: Estimated Creatinine Clearance: 74.3 mL/min (by C-G formula based on SCr of 0.62 mg/dL). Liver Function Tests: Recent Labs  Lab 01/15/22 2017 01/16/22 0720 01/17/22 0336 01/18/22 0336 01/19/22 1225  AST 27 14* 13* 11* 17  ALT 23 16 14 11 15   ALKPHOS 62 46 46 41 56  BILITOT 0.6 0.7 0.7 0.9 0.6  PROT 7.2 5.5* 5.6* 5.4* 5.9*  ALBUMIN 4.3 3.3* 3.2* 2.8* 3.0*    Recent Labs  Lab 01/15/22 2017 01/16/22 0720  LIPASE 40 48    No results for input(s): AMMONIA in the last 168 hours. Coagulation Profile: No results for input(s): INR, PROTIME in the last 168 hours. Cardiac Enzymes: No results for input(s): CKTOTAL, CKMB, CKMBINDEX, TROPONINI in the last 168 hours. BNP (last 3 results) No results for input(s): PROBNP in the last 8760 hours. HbA1C: No results for input(s): HGBA1C in the last 72 hours. CBG: No results for input(s): GLUCAP in the last 168 hours. Lipid Profile: No results for input(s): CHOL, HDL, LDLCALC, TRIG, CHOLHDL, LDLDIRECT in the last 72 hours. Thyroid Function Tests: No results for input(s): TSH, T4TOTAL, FREET4, T3FREE, THYROIDAB in the last 72 hours. Anemia Panel: No results for input(s): VITAMINB12, FOLATE, FERRITIN, TIBC, IRON, RETICCTPCT in the  last 72 hours. Sepsis Labs: No results for input(s): PROCALCITON, LATICACIDVEN in the last 168 hours.  Recent Results (from the past 240 hour(s))  Gastrointestinal Panel by PCR , Stool     Status: None   Collection Time: 01/18/22  1:28 PM   Specimen: Stool  Result Value Ref Range Status   Campylobacter species NOT DETECTED NOT DETECTED Final   Plesimonas shigelloides NOT DETECTED NOT DETECTED Final   Salmonella species NOT DETECTED NOT DETECTED Final   Yersinia enterocolitica NOT DETECTED NOT DETECTED Final   Vibrio species NOT DETECTED NOT DETECTED Final   Vibrio cholerae NOT DETECTED NOT DETECTED Final   Enteroaggregative E coli (  EAEC) NOT DETECTED NOT DETECTED Final   Enteropathogenic E coli (EPEC) NOT DETECTED NOT DETECTED Final   Enterotoxigenic E coli (ETEC) NOT DETECTED NOT DETECTED Final   Shiga like toxin producing E coli (STEC) NOT DETECTED NOT DETECTED Final   Shigella/Enteroinvasive E coli (EIEC) NOT DETECTED NOT DETECTED Final   Cryptosporidium NOT DETECTED NOT DETECTED Final   Cyclospora cayetanensis NOT DETECTED NOT DETECTED Final   Entamoeba histolytica NOT DETECTED NOT DETECTED Final   Giardia lamblia NOT DETECTED NOT DETECTED Final   Adenovirus F40/41 NOT DETECTED NOT DETECTED Final   Astrovirus NOT DETECTED NOT DETECTED Final   Norovirus GI/GII NOT DETECTED NOT DETECTED Final   Rotavirus A NOT DETECTED NOT DETECTED Final   Sapovirus (I, II, IV, and V) NOT DETECTED NOT DETECTED Final    Comment: Performed at [facility], [address] [place], [place] [address]  C Difficile Quick Screen (NO PCR Reflex)     Status: None   Collection Time: 01/18/22  1:28 PM   Specimen: Stool  Result Value Ref Range Status   C Diff antigen NEGATIVE NEGATIVE Final   C Diff toxin NEGATIVE NEGATIVE Final   C Diff interpretation No C. difficile detected.  Final    Comment: Performed at [facility], [address] [place], [place] [address]      Radiology Studies: No results found.  Scheduled Meds:  colestipol  2 g Oral BID   enoxaparin (LOVENOX) injection  40 mg Subcutaneous Q24H   famotidine  20 mg Oral Daily   Continuous Infusions:  cefTRIAXone (ROCEPHIN)  IV 2 g (01/20/22 0827)   lactated ringers 150 mL/hr at 01/20/22 0024   metronidazole 500 mg (01/20/22 0829)     LOS: 4 days   Time spent: 24 minutes  Hughie Closs, MD [facility]  01/20/2022, 11:38 AM   *Please note that this is a verbal dictation therefore any spelling or grammatical errors are due to the "Dragon Medical One" system interpretation.  Please page via Amion and do not message via secure chat for urgent patient care matters. Secure chat can be used for non urgent patient care matters.  How to contact the [facility] Attending or Consulting provider 7A - 7P or covering provider during after hours 7P -7A, for this patient?  Check the care team in [facility] and look for a) attending/consulting [facility] provider listed and b) the [facility] team listed. Page or secure chat 7A-7P. Log into [email or web address] and use [place]'s universal password to access. If you do not have the password, please contact the hospital operator. Locate the [facility] provider you are looking for under [facility] and page to a number that you can be directly reached. If you still have difficulty reaching the provider, please page the [facility] (Director on Call) for the Hospitalists listed on amion for assistance.

## 2022-01-20 NOTE — Plan of Care (Deleted)
Pt continues to have frequent, sometimes incontinent episodes of diarrhea. Poor PO intake related to diarrhea with each attempt to eat/drink.  ?

## 2022-01-20 NOTE — Progress Notes (Signed)
[facility] Progress Note ? ?Emali Heyward Mottern 56 y.o. 04/28/1966 ? ? ?Subjective: ?Continues to have profuse watery diarrhea. Incontinent of stool when she woke up this morning. Denies abdominal pain, N/V. Tolerating minimal clears. Hives following one dose of Colestid. ? ?Objective: ?Vital signs: ?Vitals:  ? 01/20/22 0605 01/20/22 1307  ?BP: (!) 108/58 118/68  ?Pulse: 80 80  ?Resp: 18 20  ?Temp: 99 ?F (37.2 ?C) (!) 100.4 ?F (38 ?C)  ?SpO2: 95% 94%  ? ? ?Physical Exam: ?Gen: alert, no acute distress, well-nourished ?HEENT: anicteric sclera ?CV: RRR ?Chest: CTA B ?Abd: soft, nontender, nondistended, +BS ?Ext: no edema ? ?Lab Results: ?Recent Labs  ?  01/19/22 ?1225 01/20/22 ?0330  ?NA 138 139  ?K 2.9* 3.1*  ?CL 104 109  ?CO2 26 26  ?GLUCOSE 106* 83  ?BUN 8 6  ?CREATININE 0.62 0.51  ?CALCIUM 8.0* 7.6*  ? ?Recent Labs  ?  01/18/22 ?0336 01/19/22 ?1225  ?AST 11* 17  ?ALT 11 15  ?ALKPHOS 41 56  ?BILITOT 0.9 0.6  ?PROT 5.4* 5.9*  ?ALBUMIN 2.8* 3.0*  ? ?Recent Labs  ?  01/18/22 ?0336 01/20/22 ?0330  ?WBC 11.9* 10.3  ?NEUTROABS  --  8.4*  ?HGB 11.6* 10.2*  ?HCT 35.6* 30.8*  ?MCV 90.1 89.5  ?PLT 191 202  ? ? ? ? ?Assessment/Plan: ?Diarrhea - stool studies negative. Possible allergic reaction to Colestid. Discontinuing Rocephin and Flagyl. Unprepped flex sig tomorrow to further evaluate diarrhea unless diarrhea resolves after stopping antibiotics. Clear liquid diet. NPO p MN. If flex sig unrevealing, then will start a trial of Cholestyramine. Continue Imodium prn. When diarrhea improves, then advance diet and consider d/c home in the next 2-3 days. ? ? ?Lear Ng ?01/20/2022, 2:47 PM ? ?Questions please call [phone] Patient ID: DIANNE WHELCHEL, female   DOB: 1966/05/14, 56 y.o.   MRN: [id number] ? ?

## 2022-01-20 NOTE — Progress Notes (Addendum)
Pt reports 3 episodes of diarrhea this morning, one in which she woke up to an incontinent episode of diarrhea. Pt reports the only oral intake she has had is small sips of water through the night. Imodium given per MD order. Pt refuses to take Colestid after possible allergic reaction yesterday afternoon. ?

## 2022-01-20 NOTE — Plan of Care (Signed)
  Problem: Health Behavior/Discharge Planning: Goal: Ability to manage health-related needs will improve Outcome: Progressing   Problem: Clinical Measurements: Goal: Ability to maintain clinical measurements within normal limits will improve Outcome: Progressing Goal: Will remain free from infection Outcome: Progressing Goal: Diagnostic test results will improve Outcome: Progressing   Problem: Activity: Goal: Risk for activity intolerance will decrease Outcome: Progressing   Problem: Nutrition: Goal: Adequate nutrition will be maintained Outcome: Progressing   Problem: Coping: Goal: Level of anxiety will decrease Outcome: Progressing   Problem: Elimination: Goal: Will not experience complications related to bowel motility Outcome: Progressing   Problem: Pain Managment: Goal: General experience of comfort will improve Outcome: Progressing   Problem: Safety: Goal: Ability to remain free from injury will improve Outcome: Progressing   

## 2022-01-20 NOTE — Plan of Care (Signed)
?  Problem: Nutrition: ?Goal: Adequate nutrition will be maintained ?01/20/2022 0857 by Annie Sable, RN ?Outcome: Not Progressing ?01/20/2022 0855 by Annie Sable, RN ?Outcome: Not Progressing ?  ?Problem: Elimination: ?Goal: Will not experience complications related to bowel motility ?01/20/2022 0857 by Annie Sable, RN ?Outcome: Not Progressing ?01/20/2022 0855 by Annie Sable, RN ?Outcome: Not Progressing ?  ?Signed    ?  ?   ?   ?Pt continues to have frequent, sometimes incontinent episodes of diarrhea. Poor PO intake related to diarrhea with each attempt to eat/drink.   ?  ?  ? ?

## 2022-01-21 ENCOUNTER — Inpatient Hospital Stay (HOSPITAL_COMMUNITY): Payer: 59 | Admitting: Anesthesiology

## 2022-01-21 ENCOUNTER — Encounter (HOSPITAL_COMMUNITY)
Admission: EM | Disposition: A | Discharge status: Home / Self Care | Payer: Self-pay | Source: Home / Self Care | Attending: Family Medicine

## 2022-01-21 ENCOUNTER — Inpatient Hospital Stay (HOSPITAL_COMMUNITY): Payer: 59

## 2022-01-21 ENCOUNTER — Encounter (HOSPITAL_COMMUNITY): Payer: Self-pay | Admitting: Family Medicine

## 2022-01-21 DIAGNOSIS — K859 Acute pancreatitis without necrosis or infection, unspecified: Secondary | ICD-10-CM | POA: Diagnosis not present

## 2022-01-21 DIAGNOSIS — K621 Rectal polyp: Secondary | ICD-10-CM

## 2022-01-21 DIAGNOSIS — K9189 Other postprocedural complications and disorders of digestive system: Secondary | ICD-10-CM | POA: Diagnosis not present

## 2022-01-21 DIAGNOSIS — I471 Supraventricular tachycardia: Secondary | ICD-10-CM

## 2022-01-21 HISTORY — PX: FLEXIBLE SIGMOIDOSCOPY: SHX5431

## 2022-01-21 HISTORY — PX: BIOPSY: SHX5522

## 2022-01-21 LAB — CBC WITH DIFFERENTIAL/PLATELET
Abs Immature Granulocytes: 0.06 10*3/uL (ref 0.00–0.07)
Basophils Absolute: 0 10*3/uL (ref 0.0–0.1)
Basophils Relative: 0 %
Eosinophils Absolute: 0.3 10*3/uL (ref 0.0–0.5)
Eosinophils Relative: 3 %
HCT: 33 % — ABNORMAL LOW (ref 36.0–46.0)
Hemoglobin: 10.8 g/dL — ABNORMAL LOW (ref 12.0–15.0)
Immature Granulocytes: 1 %
Lymphocytes Relative: 9 %
Lymphs Abs: 1 10*3/uL (ref 0.7–4.0)
MCH: 29.4 pg (ref 26.0–34.0)
MCHC: 32.7 g/dL (ref 30.0–36.0)
MCV: 89.9 fL (ref 80.0–100.0)
Monocytes Absolute: 1.1 10*3/uL — ABNORMAL HIGH (ref 0.1–1.0)
Monocytes Relative: 11 %
Neutro Abs: 8.3 10*3/uL — ABNORMAL HIGH (ref 1.7–7.7)
Neutrophils Relative %: 76 %
Platelets: 259 10*3/uL (ref 150–400)
RBC: 3.67 MIL/uL — ABNORMAL LOW (ref 3.87–5.11)
RDW: 14.1 % (ref 11.5–15.5)
WBC: 10.8 10*3/uL — ABNORMAL HIGH (ref 4.0–10.5)
nRBC: 0 % (ref 0.0–0.2)

## 2022-01-21 LAB — BASIC METABOLIC PANEL
Anion gap: 8 (ref 5–15)
BUN: 7 mg/dL (ref 6–20)
CO2: 24 mmol/L (ref 22–32)
Calcium: 7.6 mg/dL — ABNORMAL LOW (ref 8.9–10.3)
Chloride: 105 mmol/L (ref 98–111)
Creatinine, Ser: 0.63 mg/dL (ref 0.44–1.00)
GFR, Estimated: >60 mL/min (ref >60)
Glucose, Bld: 73 mg/dL (ref 70–99)
Potassium: 3.9 mmol/L (ref 3.5–5.1)
Sodium: 137 mmol/L (ref 135–145)

## 2022-01-21 LAB — MAGNESIUM: Magnesium: 1.9 mg/dL (ref 1.7–2.4)

## 2022-01-21 LAB — PROCALCITONIN: Procalcitonin: 0.43 ng/mL

## 2022-01-21 SURGERY — SIGMOIDOSCOPY, FLEXIBLE
Anesthesia: Monitor Anesthesia Care

## 2022-01-21 MED ORDER — SODIUM CHLORIDE 0.9 % IV SOLN
500.0000 mg | INTRAVENOUS | Status: DC
  Administered 2022-01-21 – 2022-01-23 (×3): 500 mg via INTRAVENOUS
  Filled 2022-01-21 (×3): qty 5

## 2022-01-21 MED ORDER — POTASSIUM CHLORIDE CRYS ER 20 MEQ PO TBCR
40.0000 meq | EXTENDED_RELEASE_TABLET | Freq: Once | ORAL | Status: AC
  Administered 2022-01-21: 40 meq via ORAL
  Filled 2022-01-21: qty 2

## 2022-01-21 MED ORDER — PROPOFOL 500 MG/50ML IV EMUL
INTRAVENOUS | Status: DC | PRN
  Administered 2022-01-21: 125 ug/kg/min via INTRAVENOUS

## 2022-01-21 MED ORDER — LACTATED RINGERS IV SOLN
INTRAVENOUS | Status: AC | PRN
  Administered 2022-01-21: 1000 mL via INTRAVENOUS

## 2022-01-21 MED ORDER — OXYCODONE HCL 5 MG/5ML PO SOLN: 5.0000 mg | Freq: Once | ORAL | Status: DC | PRN

## 2022-01-21 MED ORDER — PROPOFOL 10 MG/ML IV BOLUS
INTRAVENOUS | Status: DC | PRN
  Administered 2022-01-21 (×2): 30 mg via INTRAVENOUS
  Administered 2022-01-21: 20 mg via INTRAVENOUS

## 2022-01-21 MED ORDER — MAGNESIUM SULFATE 2 GM/50ML IV SOLN
2.0000 g | Freq: Once | INTRAVENOUS | Status: AC
  Administered 2022-01-21: 2 g via INTRAVENOUS
  Filled 2022-01-21: qty 50

## 2022-01-21 MED ORDER — PROMETHAZINE HCL 25 MG/ML IJ SOLN: 6.2500 mg | INTRAMUSCULAR | Status: DC | PRN

## 2022-01-21 MED ORDER — PROPOFOL 500 MG/50ML IV EMUL
INTRAVENOUS | Status: AC
  Filled 2022-01-21: qty 50

## 2022-01-21 MED ORDER — LIDOCAINE 2% (20 MG/ML) 5 ML SYRINGE
INTRAMUSCULAR | Status: DC | PRN
  Administered 2022-01-21: 80 mg via INTRAVENOUS

## 2022-01-21 MED ORDER — HYDROMORPHONE HCL 1 MG/ML IJ SOLN: 0.2500 mg | INTRAMUSCULAR | Status: DC | PRN

## 2022-01-21 MED ORDER — SODIUM CHLORIDE 0.9 % IV SOLN
2.0000 g | INTRAVENOUS | Status: DC
  Administered 2022-01-21 – 2022-01-23 (×3): 2 g via INTRAVENOUS
  Filled 2022-01-21 (×5): qty 20

## 2022-01-21 MED ORDER — ONDANSETRON HCL 4 MG/2ML IJ SOLN
INTRAMUSCULAR | Status: DC | PRN
  Administered 2022-01-21: 4 mg via INTRAVENOUS

## 2022-01-21 MED ORDER — OXYCODONE HCL 5 MG PO TABS: 5.0000 mg | ORAL_TABLET | Freq: Once | ORAL | Status: DC | PRN

## 2022-01-21 NOTE — Plan of Care (Signed)
?  Problem: Health Behavior/Discharge Planning: ?Goal: Ability to manage health-related needs will improve ?Outcome: Progressing ?  ?Problem: Clinical Measurements: ?Goal: Ability to maintain clinical measurements within normal limits will improve ?Outcome: Progressing ?Goal: Will remain free from infection ?Outcome: Progressing ?Goal: Diagnostic test results will improve ?Outcome: Progressing ?  ?Problem: Activity: ?Goal: Risk for activity intolerance will decrease ?Outcome: Progressing ?  ?

## 2022-01-21 NOTE — Anesthesia Procedure Notes (Signed)
Procedure Name: [place] ?Date/Time: 01/21/2022 2:28 PM ?Performed by: Lollie Sails, CRNA ?Pre-anesthesia Checklist: Patient identified, Emergency Drugs available, Suction available, Patient being monitored and Timeout performed ?Oxygen Delivery Method: Simple face mask ?Placement Confirmation: positive ETCO2 ? ? ? ? ?

## 2022-01-21 NOTE — Op Note (Signed)
[facility] ?Patient Name: Laurie Terry ?Procedure Date: 01/21/2022 ?MRN: [id number] ?Attending MD: Lear Ng , MD ?Date of Birth: [birth year] ?CSN: [id number] ?Age: 56 ?Admit Type: Inpatient ?Procedure:                Flexible Sigmoidoscopy ?Indications:              Diarrhea ?Providers:                Lear Ng, MD, Kary Kos RN, RN,  ?                          Frazier Richards, Technician ?Referring MD:             hospital team ?Medicines:                Propofol per Anesthesia, Monitored Anesthesia Care ?Complications:            No immediate complications. ?Estimated Blood Loss:     Estimated blood loss was minimal. ?Procedure:                Pre-Anesthesia Assessment: ?                          - Prior to the procedure, a History and Physical  ?                          was performed, and patient medications and  ?                          allergies were reviewed. The patient's tolerance of  ?                          previous anesthesia was also reviewed. The risks  ?                          and benefits of the procedure and the sedation  ?                          options and risks were discussed with the patient.  ?                          All questions were answered, and informed consent  ?                          was obtained. Prior Anticoagulants: The patient has  ?                          taken no previous anticoagulant or antiplatelet  ?                          agents. ASA Grade Assessment: II - A patient with  ?                          mild systemic disease. After reviewing the risks  ?  and benefits, the patient was deemed in  ?                          satisfactory condition to undergo the procedure. ?                          After obtaining informed consent, the scope was  ?                          passed under direct vision. The PCF-HQ190L  ?                          ([id number]) Olympus colonoscope was introduced  ?                           through the anus and advanced to the the splenic  ?                          flexure. The flexible sigmoidoscopy was  ?                          accomplished without difficulty. The patient  ?                          tolerated the procedure well. The quality of the  ?                          bowel preparation was fair. ?Scope In: 2:37:22 PM ?Scope Out: 2:44:51 PM ?Total Procedure Duration: 0 hours 7 minutes 29 seconds  ?Findings: ?     The perianal and digital rectal examinations were normal. ?     The colon (entire examined portion) appeared normal. Biopsies were taken  ?     with a cold forceps for histology. Estimated blood loss was minimal. ?     A moderate amount of liquid semi-liquid semi-solid stool was found in  ?     the descending colon, making visualization difficult. ?     A 3 mm polyp was found in the rectum. The polyp was semi-sessile. The  ?     polyp was removed with a cold biopsy forceps. Resection and retrieval  ?     were complete. Estimated blood loss was minimal. ?     Retroflexion not attempted due to small rectal vault. Careful withdrawal  ?     from the rectum revealed small internal hemorrhoids. ?Impression:               - Preparation of the colon was fair. ?                          - The entire examined colon is normal. Biopsied. ?                          - Stool in the descending colon. ?                          - One 3 mm polyp in the rectum, removed with a cold  ?  biopsy forceps. Resected and retrieved. ?Moderate Sedation: ?     N/A - MAC procedure ?Recommendation:           - Full liquid diet. ?                          - Advance diet as tolerated. ?                          - Await pathology results. ?Procedure Code(s):        --- Professional --- ?                          [phone], Sigmoidoscopy, flexible; with biopsy, single  ?                          or multiple ?Diagnosis Code(s):        --- Professional --- ?                          R19.7,  Diarrhea, unspecified ?                          K62.1, Rectal polyp ?CPT copyright 2019 [employer] Medical Association. All rights reserved. ?The codes documented in this report are preliminary and upon coder review may  ?be revised to meet current compliance requirements. ?Lear Ng, MD ?01/21/2022 2:53:48 PM ?This report has been signed electronically. ?Number of Addenda: 0 ?

## 2022-01-21 NOTE — Progress Notes (Signed)
Spoke with MD concerning pt abt--Rocephin, will hold until MD review. Also held Lovenox pt for Flex Sigmoid, will administer after procedure. ? ?

## 2022-01-21 NOTE — Transfer of Care (Signed)
Immediate Anesthesia Transfer of Care Note ? ?Patient: CHANDRIKA SANDLES ? ?Procedure(s) Performed: FLEXIBLE SIGMOIDOSCOPY ?BIOPSY ? ?Patient Location: PACU and Endoscopy Unit ? ?Anesthesia Type:MAC ? ?Level of Consciousness: awake, alert  and oriented ? ?Airway & Oxygen Therapy: Patient Spontanous Breathing and Patient connected to face mask oxygen ? ?Post-op Assessment: Report given to RN, Post -op Vital signs reviewed and stable and O2 sat 100% ? ?Post vital signs: Reviewed and stable ? ?Last Vitals:  ?Vitals Value Taken Time  ?BP    ?Temp    ?Pulse    ?Resp    ?SpO2    ? ? ?Last Pain:  ?Vitals:  ? 01/21/22 1324  ?TempSrc: Oral  ?PainSc: 0-No pain  ?   ? ?Patients Stated Pain Goal: 0 (01/21/22 0830) ? ?Complications: No notable events documented. ?

## 2022-01-21 NOTE — Progress Notes (Signed)
?PROGRESS NOTE ? ? ? ?Laurie Terry  UKG:[id number] DOB: 1966/05/29 DOA: 01/15/2022 ?PCP: [name], MD ? ? ?Brief Narrative:  ?This is a pleasant 56 year old lady with history of melanoma, paroxysmal SVT, thoracic aortic aneurysm and other comorbidities presented to emergency department after having outpatient ERCP and was admitted with acute pancreatitis in the ED.  GI is on board.  Lipase normal.  Upper GI series negative for any biliary leak or any duodenal leak. ? ?Assessment & Plan: ?  ?Principal Problem: ?  Post-ERCP acute pancreatitis ?Active Problems: ?  Choledocholithiasis ?  Paroxysmal SVT (supraventricular tachycardia) (HCC) ?  GERD without esophagitis ?  Acute pancreatitis ? ?Post ERCP acute pancreatitis: Pain improving.  Still with intermittent nausea.  Her major complaint at this point time is diarrhea.  She has been having unlimited number of episodes of watery diarrhea.  GI pathogen panel is also negative along with C. difficile.  She underwent flex sigmoidoscopy and according to GI, they did not find any etiology for her diarrhea.  They recommended continuing diet. ? ?Diarrhea: Patient's diarrhea is improving now.  Now that fleck sigmoidoscopy negative, GI is going to start her on cholestyramine. ? ?Hypokalemia: Resolved. ? ?GERD without esophagitis: Continue H2 blocker. ? ?Paroxysmal SVT: Continue home dose of metoprolol. ? ?Fever/community-acquired pneumonia: Overnight patient had fever of 101.7.  Blood cultures were drawn.  Chest x-ray shows bilateral pleural effusion, right greater than left, likely atelectasis but pneumonia cannot be ruled out.  Procalcitonin 0.47.  No other source of infection is obvious at this point in time.  I will repeat CT abdomen and pelvis to make sure there is no intra-abdominal infection as a source of fever.  At this point in time, I spoke to the patient again and recommended continue Rocephin and Zithromax that were started this morning and she is in  agreement with that. ? ?DVT prophylaxis: enoxaparin (LOVENOX) injection 40 mg Start: 01/16/22 1000 ?  Code Status: Full Code  ?Family Communication: Husband present at bedside.  Plan of care discussed with patient in length and he/she verbalized understanding and agreed with it. ? ?Status is: Inpatient ?Remains inpatient appropriate because: Still n.p.o. with abdominal pain. ? ? ?Estimated body mass index is 25.58 kg/m? as calculated from the following: ?  Height as of this encounter: '5\' 4"'$  (1.626 m). ?  Weight as of this encounter: 67.6 kg. ? ?  ?Nutritional Assessment: ?Body mass index is 25.58 kg/m?Marland KitchenMarland Kitchen ?Seen by dietician.  I agree with the assessment and plan as outlined below: ?Nutrition Status: ?  ?  ?  ? ?. ?Skin Assessment: ?I have examined the patient's skin and I agree with the wound assessment as performed by the wound care RN as outlined below: ?  ? ?Consultants:  ?GI ? ?Procedures:  ?As above ? ?Antimicrobials:  ?Anti-infectives (From admission, onward)  ? ? Start     Dose/Rate Route Frequency Ordered Stop  ? 01/21/22 0800  [MAR Hold]  cefTRIAXone (ROCEPHIN) 2 g in sodium chloride 0.9 % 100 mL IVPB        (MAR Hold since Mon 01/21/2022 at 1322.Hold Reason: Transfer to a Procedural area)  ? 2 g ?200 mL/hr over 30 Minutes Intravenous Every 24 hours 01/21/22 0013 01/26/22 0759  ? 01/21/22 0100  [MAR Hold]  azithromycin (ZITHROMAX) 500 mg in sodium chloride 0.9 % 250 mL IVPB        (MAR Hold since Mon 01/21/2022 at 1322.Hold Reason: Transfer to a Procedural area)  ? [place]  mg ?250 mL/hr over 60 Minutes Intravenous Every 24 hours 01/21/22 0013 01/26/22 0059  ? 01/16/22 1000  cefTRIAXone (ROCEPHIN) 2 g in sodium chloride 0.9 % 100 mL IVPB  Status:  Discontinued       ? 2 g ?200 mL/hr over 30 Minutes Intravenous Every 24 hours 01/16/22 0934 01/20/22 1439  ? 01/16/22 1000  metroNIDAZOLE (FLAGYL) IVPB 500 mg  Status:  Discontinued       ? 500 mg ?100 mL/hr over 60 Minutes Intravenous Every 12 hours 01/16/22 0934 01/20/22  1439  ? ?  ?  ? ? ?Subjective: ? ?Patient seen and examined before sigmoidoscopy and then again after sigmoidoscopy.  Her pain is improving and her diarrhea has improved as well.  She did complain of some nonproductive cough along with some shortness of breath this morning. ? ?Objective: ?Vitals:  ? 01/20/22 2340 01/21/22 0330 01/21/22 0758 01/21/22 1324  ?BP: 96/62 106/64 96/61 117/60  ?Pulse: 80 77 82 81  ?Resp: '18 20 18 14  '$ ?Temp: 98.8 ?F (37.1 ?C) 100 ?F (37.8 ?C) 99 ?F (37.2 ?C) 99.2 ?F (37.3 ?C)  ?TempSrc: Oral Oral Oral Oral  ?SpO2: 92% 93% 92% 97%  ?Weight:    67.6 kg  ?Height:    '5\' 4"'$  (1.626 m)  ? ? ?Intake/Output Summary (Last 24 hours) at 01/21/2022 1410 ?Last data filed at 01/21/2022 0600 ?Gross per 24 hour  ?Intake 1644.76 ml  ?Output --  ?Net 1644.76 ml  ? ? ?Filed Weights  ? 01/15/22 1949 01/21/22 1324  ?Weight: 67.6 kg 67.6 kg  ? ? ?Examination: ? ?General exam: Appears calm and comfortable  ?Respiratory system: Diminished breath sounds at the bases bilaterally, more on the right than the left. ?Cardiovascular system: S1 & S2 heard, RRR. No JVD, murmurs, rubs, gallops or clicks. No pedal edema. ?Gastrointestinal system: Abdomen is nondistended, soft and nontender. No organomegaly or masses felt. Normal bowel sounds heard. ?Central nervous system: Alert and oriented. No focal neurological deficits. ?Extremities: Symmetric 5 x 5 power. ?Skin: No rashes, lesions or ulcers.  ?Psychiatry: Judgement and insight appear normal. Mood & affect appropriate.  ? ? ?Data Reviewed: I have personally reviewed following labs and imaging studies ? ?CBC: ?Recent Labs  ?Lab 01/15/22 ?2017 01/16/22 ?0720 01/18/22 ?5573 01/20/22 ?0330 01/21/22 ?0805  ?WBC 7.4 10.9* 11.9* 10.3 10.8*  ?NEUTROABS  --  8.8*  --  8.4* 8.3*  ?HGB 14.0 11.1* 11.6* 10.2* 10.8*  ?HCT 42.5 34.5* 35.6* 30.8* 33.0*  ?MCV 89.7 91.0 90.1 89.5 89.9  ?PLT 265 210 191 202 259  ? ? ?Basic Metabolic Panel: ?Recent Labs  ?Lab 01/16/22 ?0720 01/17/22 ?0336  01/18/22 ?0336 01/19/22 ?1225 01/20/22 ?0330 01/21/22 ?2202 01/21/22 ?0805  ?NA 136 137 138 138 139  --  137  ?K 3.9 3.9 3.3* 2.9* 3.1*  --  3.9  ?CL 108 107 107 104 109  --  105  ?CO2 '24 22 27 26 26  '$ --  24  ?GLUCOSE 102* 137* 119* 106* 83  --  73  ?BUN '14 10 12 8 6  '$ --  7  ?CREATININE 0.55 0.60 0.55 0.62 0.51  --  0.63  ?CALCIUM 7.7* 8.3* 7.9* 8.0* 7.6*  --  7.6*  ?MG 2.1  --   --   --   --  1.9  --   ? ? ?GFR: ?Estimated Creatinine Clearance: 74.3 mL/min (by C-G formula based on SCr of 0.63 mg/dL). ?Liver Function Tests: ?Recent Labs  ?Lab 01/15/22 ?2017 01/16/22 ?  0720 01/17/22 ?2947 01/18/22 ?6546 01/19/22 ?1225  ?AST 27 14* 13* 11* 17  ?ALT '23 16 14 11 15  '$ ?ALKPHOS 62 46 46 41 56  ?BILITOT 0.6 0.7 0.7 0.9 0.6  ?PROT 7.2 5.5* 5.6* 5.4* 5.9*  ?ALBUMIN 4.3 3.3* 3.2* 2.8* 3.0*  ? ? ?Recent Labs  ?Lab 01/15/22 ?2017 01/16/22 ?0720  ?LIPASE 40 48  ? ? ?No results for input(s): AMMONIA in the last 168 hours. ?Coagulation Profile: ?No results for input(s): INR, PROTIME in the last 168 hours. ?Cardiac Enzymes: ?No results for input(s): CKTOTAL, CKMB, CKMBINDEX, TROPONINI in the last 168 hours. ?BNP (last 3 results) ?No results for input(s): PROBNP in the last 8760 hours. ?HbA1C: ?No results for input(s): HGBA1C in the last 72 hours. ?CBG: ?No results for input(s): GLUCAP in the last 168 hours. ?Lipid Profile: ?No results for input(s): CHOL, HDL, LDLCALC, TRIG, CHOLHDL, LDLDIRECT in the last 72 hours. ?Thyroid Function Tests: ?No results for input(s): TSH, T4TOTAL, FREET4, T3FREE, THYROIDAB in the last 72 hours. ?Anemia Panel: ?No results for input(s): VITAMINB12, FOLATE, FERRITIN, TIBC, IRON, RETICCTPCT in the last 72 hours. ?Sepsis Labs: ?Recent Labs  ?Lab 01/21/22 ?5035  ?PROCALCITON 0.43  ? ? ?Recent Results (from the past 240 hour(s))  ?Gastrointestinal Panel by PCR , Stool     Status: None  ? Collection Time: 01/18/22  1:28 PM  ? Specimen: Stool  ?Result Value Ref Range Status  ? Campylobacter species NOT DETECTED  NOT DETECTED Final  ? Plesimonas shigelloides NOT DETECTED NOT DETECTED Final  ? Salmonella species NOT DETECTED NOT DETECTED Final  ? Yersinia enterocolitica NOT DETECTED NOT DETECTED Final  ? Vibrio species

## 2022-01-21 NOTE — Progress Notes (Signed)
[facility] Progress Note ? ?Laurie Terry 56 y.o. 05-26-1966 ? ?CC: Post ERCP pancreatitis, diarrhea ? ? ?Subjective: ?Patient seen and examined lying in bed.  Notes diarrhea has improved, though she continues to have watery loose stools every 2-3 hours.  She is taking 8 Imodium pills. previously she was having incontinence and near constant bowel movements.  Denies any melena, hematochezia, nausea, vomiting.  Continues to have right upper quadrant pain. ? ?ROS : Review of Systems  ?Gastrointestinal:  Positive for abdominal pain and diarrhea. Negative for blood in stool, constipation, heartburn, melena, nausea and vomiting.  ?Genitourinary:  Negative for dysuria and urgency.  ? ? ? ?Objective: ?Vital signs in last 24 hours: ?Vitals:  ? 01/21/22 0330 01/21/22 0758  ?BP: 106/64 96/61  ?Pulse: 77 82  ?Resp: 20 18  ?Temp: 100 ?F (37.8 ?C) 99 ?F (37.2 ?C)  ?SpO2: 93% 92%  ? ? ?Physical Exam: ? ?General:  Alert, cooperative, no distress, appears stated age  ?Head:  Normocephalic, without obvious abnormality, atraumatic  ?Eyes:  Anicteric sclera, EOM's intact  ?Lungs:   Clear to auscultation bilaterally, respirations unlabored  ?Heart:  Regular rate and rhythm, S1, S2 normal  ?Abdomen:   Soft, tender to right upper quadrant palpation, no guarding, no rebound, bowel sounds active all four quadrants,  no masses,   ?Extremities: Extremities normal, atraumatic, no  edema  ?Pulses: 2+ and symmetric  ? ? ?Lab Results: ?Recent Labs  ?  01/20/22 ?0330 01/21/22 ?7408 01/21/22 ?0805  ?NA 139  --  137  ?K 3.1*  --  3.9  ?CL 109  --  105  ?CO2 26  --  24  ?GLUCOSE 83  --  73  ?BUN 6  --  7  ?CREATININE 0.51  --  0.63  ?CALCIUM 7.6*  --  7.6*  ?MG  --  1.9  --   ? ?Recent Labs  ?  01/19/22 ?1225  ?AST 17  ?ALT 15  ?ALKPHOS 56  ?BILITOT 0.6  ?PROT 5.9*  ?ALBUMIN 3.0*  ? ?Recent Labs  ?  01/20/22 ?0330 01/21/22 ?0805  ?WBC 10.3 10.8*  ?NEUTROABS 8.4* 8.3*  ?HGB 10.2* 10.8*  ?HCT 30.8* 33.0*  ?MCV 89.5 89.9  ?PLT 202 259   ? ?No results for input(s): LABPROT, INR in the last 72 hours. ? ? ? ?Assessment ?Pancreatitis ?Abdominal pain ?Diarrhea ? ?Abdominal pain continuing to improve.  Pancreatitis likely due to to recent ERCP.  ? ?Hemoglobin stable at 10.8 ? ?Diarrhea improving with Imodium.  Previously had possible allergic reaction to Colestid.  GI pathogen panel and C. difficile negative.  Possible diarrhea due to antibiotics.  ?Patient is currently on azithromycin and ceftriaxone for concern of community-acquired pneumonia.  ? ?Plan: ?Continue supportive care ?Plan for unprepped flex sig today. I thoroughly discussed the procedures to include nature, alternatives, benefits, and risks including but not limited to bleeding, perforation, infection, anesthesia/cardiac and pulmonary complications. Patient provides understanding and gave verbal consent to proceed. ?If Flex sig unrevealing at her starting trial of cholestyramine. ?Return to clear liquid diet after procedure. ?Continue daily CBC with transfusion as needed to maintain Hgb >7.  ?Continue Imodium as needed. ?We will continue to monitor for worsening diarrhea on new IV antibiotics. ?[facility] will follow.    ? ?Charlott Rakes PA-C ?01/21/2022, 12:15 PM ? ?Contact #  [phone]  ?

## 2022-01-21 NOTE — Anesthesia Preprocedure Evaluation (Signed)
Anesthesia Evaluation  ?Patient identified by MRN, date of birth, ID band ?Patient awake ? ? ? ?Reviewed: ?Allergy & Precautions, NPO status , Patient's Chart, lab work & pertinent test results ? ?History of Anesthesia Complications ?Negative for: history of anesthetic complications ? ?Airway ?Mallampati: II ? ?TM Distance: >3 FB ?Neck ROM: Full ? ? ? Dental ?no notable dental hx. ?(+) Missing,  ?  ?Pulmonary ?former smoker,  ?  ?Pulmonary exam normal ? ? ? ? ? ? ? Cardiovascular ?Normal cardiovascular exam+ dysrhythmias Supra Ventricular Tachycardia  ? ? ?  ?Neuro/Psych ? Headaches, negative psych ROS  ? GI/Hepatic ?Neg liver ROS, GERD  ,CBD stone ?  ?Endo/Other  ?negative endocrine ROS ? Renal/GU ?negative Renal ROS  ?negative genitourinary ?  ?Musculoskeletal ?negative musculoskeletal ROS ?(+)  ? Abdominal ?  ?Peds ? Hematology ?negative hematology ROS ?(+)   ?Anesthesia Other Findings ?Day of surgery medications reviewed with patient. ? Reproductive/Obstetrics ?negative OB ROS ? ?  ? ? ? ? ? ? ? ? ? ? ? ? ? ?  ?  ? ? ? ? ? ? ? ? ?Anesthesia Physical ? ?Anesthesia Plan ? ?ASA: 2 ? ?Anesthesia Plan: MAC  ? ?Post-op Pain Management: Minimal or no pain anticipated  ? ?Induction: Intravenous ? ?PONV Risk Score and Plan: 2 and Treatment may vary due to age or medical condition, Ondansetron, Midazolam and Propofol infusion ? ?Airway Management Planned: Simple Face Mask ? ?Additional Equipment: None ? ?Intra-op Plan:  ? ?Post-operative Plan:  ? ?Informed Consent: I have reviewed the patients History and Physical, chart, labs and discussed the procedure including the risks, benefits and alternatives for the proposed anesthesia with the patient or authorized representative who has indicated his/her understanding and acceptance.  ? ? ? ?Dental advisory given ? ?Plan Discussed with: CRNA ? ?Anesthesia Plan Comments:   ? ? ? ? ? ? ?Anesthesia Quick Evaluation ? ?

## 2022-01-22 ENCOUNTER — Encounter (HOSPITAL_COMMUNITY): Payer: Self-pay | Admitting: Gastroenterology

## 2022-01-22 DIAGNOSIS — K859 Acute pancreatitis without necrosis or infection, unspecified: Secondary | ICD-10-CM | POA: Diagnosis not present

## 2022-01-22 DIAGNOSIS — K9189 Other postprocedural complications and disorders of digestive system: Secondary | ICD-10-CM | POA: Diagnosis not present

## 2022-01-22 LAB — BASIC METABOLIC PANEL
Anion gap: 13 (ref 5–15)
BUN: <5 mg/dL — ABNORMAL LOW (ref 6–20)
CO2: 18 mmol/L — ABNORMAL LOW (ref 22–32)
Calcium: 7.7 mg/dL — ABNORMAL LOW (ref 8.9–10.3)
Chloride: 104 mmol/L (ref 98–111)
Creatinine, Ser: 0.59 mg/dL (ref 0.44–1.00)
GFR, Estimated: >60 mL/min (ref >60)
Glucose, Bld: 68 mg/dL — ABNORMAL LOW (ref 70–99)
Potassium: 3.6 mmol/L (ref 3.5–5.1)
Sodium: 135 mmol/L (ref 135–145)

## 2022-01-22 LAB — CBC WITH DIFFERENTIAL/PLATELET
Abs Immature Granulocytes: 0.22 10*3/uL — ABNORMAL HIGH (ref 0.00–0.07)
Basophils Absolute: 0.1 10*3/uL (ref 0.0–0.1)
Basophils Relative: 1 %
Eosinophils Absolute: 0.4 10*3/uL (ref 0.0–0.5)
Eosinophils Relative: 4 %
HCT: 31.9 % — ABNORMAL LOW (ref 36.0–46.0)
Hemoglobin: 10.6 g/dL — ABNORMAL LOW (ref 12.0–15.0)
Immature Granulocytes: 2 %
Lymphocytes Relative: 9 %
Lymphs Abs: 0.9 10*3/uL (ref 0.7–4.0)
MCH: 29.9 pg (ref 26.0–34.0)
MCHC: 33.2 g/dL (ref 30.0–36.0)
MCV: 89.9 fL (ref 80.0–100.0)
Monocytes Absolute: 1.3 10*3/uL — ABNORMAL HIGH (ref 0.1–1.0)
Monocytes Relative: 13 %
Neutro Abs: 6.9 10*3/uL (ref 1.7–7.7)
Neutrophils Relative %: 71 %
Platelets: 261 10*3/uL (ref 150–400)
RBC: 3.55 MIL/uL — ABNORMAL LOW (ref 3.87–5.11)
RDW: 14.1 % (ref 11.5–15.5)
WBC: 9.8 10*3/uL (ref 4.0–10.5)
nRBC: 0 % (ref 0.0–0.2)

## 2022-01-22 LAB — SURGICAL PATHOLOGY

## 2022-01-22 MED ORDER — CHOLESTYRAMINE LIGHT 4 G PO PACK
4.0000 g | PACK | ORAL | Status: DC | PRN
  Filled 2022-01-22: qty 1

## 2022-01-22 NOTE — Progress Notes (Signed)
Jennye Moccasin Beel ?2:12 PM ? ?Subjective: ?Patient seen and examined and discussed last week with my partner Dr. Alessandra Bevels in this week with my partner Dr. Michail Sermon as well as our PA and today she is finally doing better her hospital course and multiple scans and labs were reviewed her diarrhea is better and her pain is better but she still has a little right upper quadrant with a deep breath and a little weakness fatigue and dyspnea on exertion and we extensively discussed her procedure and how I actually think she had a small retroperitoneal perforation and not ERCP pancreatitis based on the fluid in the gallbladder fossa as well as the normal lipase x3 and the pancreas was normal on follow-up CT although the fluid may have increased a little and we discussed what to expect going forward and we answered all of her questions ? ?Objective: ?Vital signs stable afebrile no acute distress in good spirits abdomen is soft nontender chemistries normal white count normal hemoglobin down a little from baseline diarrhea work-up negative biopsies pending ? ?Assessment: ?Post ERCP complication probably small retroperitoneal perforation currently improved ? ?Plan: ?Hopefully she can go home within a few days and 1 oral iron was discussed and she will follow-up with me in a month and if still having discomfort consider repeat CT scan and possible aspirate or drain from the gallbladder fossa and would only proceed now if increased fever or white count where you are worried about an infection and will also repeat labs at that point she knows to call me sooner as needed and we discussed Imodium or possible Questran as an outpatient and all of her questions were answered and if diarrhea just mild might even consider Carafate and will ask Dr. Michail Sermon to check on tomorrow and less I am back at the hospital but she knows to call me as needed ? ?[facility] ? ?office [phone] ?After 5PM or if no answer call [phone]  ?

## 2022-01-22 NOTE — Progress Notes (Signed)
[facility] Progress Note ? ?Laurie Terry 56 y.o. [birth year] ? ?CC: Diarrhea, post ERCP pancreatitis ? ? ?Subjective: ?Patient seen examined sitting in chair.  Patient notes her diarrhea is much improved today.  She has had 3 bowel movements since procedure yesterday.  Notes bowel movements are loose but smaller volume and less urgent than previously.  Patient has tolerated yogurt and coffee so far well today.  Continues to have right upper quadrant tenderness. ? ?ROS : Review of Systems  ?Gastrointestinal:  Positive for abdominal pain and diarrhea. Negative for blood in stool, constipation, heartburn, melena, nausea and vomiting.  ?Genitourinary:  Negative for dysuria and urgency.  ? ? ? ?Objective: ?Vital signs in last 24 hours: ?Vitals:  ? 01/21/22 2051 01/22/22 0624  ?BP: 95/63 109/64  ?Pulse: 82 92  ?Resp: 18 18  ?Temp: 99.8 ?F (37.7 ?C) 99.6 ?F (37.6 ?C)  ?SpO2: 94% 93%  ? ? ?Physical Exam: ? ?General:  Alert, cooperative, no distress, appears stated age  ?Head:  Normocephalic, without obvious abnormality, atraumatic  ?Eyes:  Anicteric sclera, EOM's intact  ?Lungs:   Clear to auscultation bilaterally, respirations unlabored  ?Heart:  Regular rate and rhythm, S1, S2 normal  ?Abdomen:   Soft, right upper quadrant tender to palpation, bowel sounds active all four quadrants,  no masses,   ?Extremities: Extremities normal, atraumatic, no  edema  ?Pulses: 2+ and symmetric  ? ? ?Lab Results: ?Recent Labs  ?  01/21/22 ?0322 01/21/22 ?0805 01/22/22 ?0810  ?NA  --  137 135  ?K  --  3.9 3.6  ?CL  --  105 104  ?CO2  --  24 18*  ?GLUCOSE  --  73 68*  ?BUN  --  7 <5*  ?CREATININE  --  0.63 0.59  ?CALCIUM  --  7.6* 7.7*  ?MG 1.9  --   --   ? ?Recent Labs  ?  01/19/22 ?1225  ?AST 17  ?ALT 15  ?ALKPHOS 56  ?BILITOT 0.6  ?PROT 5.9*  ?ALBUMIN 3.0*  ? ?Recent Labs  ?  01/21/22 ?0805 01/22/22 ?0810  ?WBC 10.8* 9.8  ?NEUTROABS 8.3* 6.9  ?HGB 10.8* 10.6*  ?HCT 33.0* 31.9*  ?MCV 89.9 89.9  ?PLT 259 261  ? ?No results  for input(s): LABPROT, INR in the last 72 hours. ? ? ? ?Assessment ?Pancreatitis ?Abdominal pain ?Diarrhea ?pneumonia ?  ?Abdominal pain continuing to improve.  Pancreatitis likely due to to recent ERCP.  ?  ?Hemoglobin stable at 10.8 ?  ?Diarrhea improving.  Previously had possible allergic reaction to Colestid.  GI pathogen panel and C. difficile negative.  Possible diarrhea due to antibiotics.  ?Patient is currently on azithromycin and ceftriaxone for concern of community-acquired pneumonia.  ? ?Possible diarrhea due to previous antibiotics.  Possible microscopic colitis will follow biopsy results from flex sig. ? ?Flex sigmoidoscopy 01/21/2022 ?Fair colon prep ?Entire examined colon normal, biopsied ?One 3 mm polyp in the rectum removed ? ?Plan: ?Continue supportive care ?We will add cholestyramine as needed for diarrhea if worsens again. ?Start soft diet. ?[facility] will follow. ? ?Charlott Rakes PA-C ?01/22/2022, 11:28 AM ? ?Contact #  [phone]  ?

## 2022-01-22 NOTE — Anesthesia Postprocedure Evaluation (Signed)
Anesthesia Post Note ? ?Patient: Laurie Terry ? ?Procedure(s) Performed: FLEXIBLE SIGMOIDOSCOPY ?BIOPSY ? ?  ? ?Patient location during evaluation: PACU ?Anesthesia Type: MAC ?Level of consciousness: awake and alert ?Pain management: pain level controlled ?Vital Signs Assessment: post-procedure vital signs reviewed and stable ?Respiratory status: spontaneous breathing, nonlabored ventilation and respiratory function stable ?Cardiovascular status: blood pressure returned to baseline and stable ?Postop Assessment: no apparent nausea or vomiting ?Anesthetic complications: no ? ? ?No notable events documented. ? ?Last Vitals:  ?Vitals:  ? 01/21/22 2051 01/22/22 0624  ?BP: 95/63 109/64  ?Pulse: 82 92  ?Resp: 18 18  ?Temp: 37.7 ?C 37.6 ?C  ?SpO2: 94% 93%  ?  ?Last Pain:  ?Vitals:  ? 01/22/22 0707  ?TempSrc:   ?PainSc: 3   ? ? ?  ?  ?  ?  ?  ?  ? ?Lynda Rainwater ? ? ? ? ?

## 2022-01-22 NOTE — Progress Notes (Signed)
?PROGRESS NOTE ? ? ? ?Laurie Terry  VPX:[id number] DOB: 1965/12/19 DOA: 01/15/2022 ?PCP: [name], MD ? ? ?Brief Narrative:  ?This is a pleasant 56 year old lady with history of melanoma, paroxysmal SVT, thoracic aortic aneurysm and other comorbidities presented to emergency department with abdominal pain after having outpatient ERCP and was admitted with acute pancreatitis in the ED.  GI is on board.  Lipase normal.  Upper GI series negative for any biliary leak or any duodenal leak.  Complicated hospitalization as detailed below. ? ?Assessment & Plan: ?  ?Principal Problem: ?  Post-ERCP acute pancreatitis ?Active Problems: ?  Choledocholithiasis ?  Paroxysmal SVT (supraventricular tachycardia) (HCC) ?  GERD without esophagitis ?  Acute pancreatitis ? ?Post ERCP acute pancreatitis: Pain is now almost resolved.  No tenderness on exam.  GI pathogen panel is also negative along with C. difficile.  She underwent flex sigmoidoscopy and according to GI, they did not find any etiology for her diarrhea, A 3 mm polyp was found in the rectum. The polyp was semi-sessile. The polyp was removed with a cold biopsy forceps, biopsies sent.  Now that she has no nausea, she is comfortable with advancing to soft diet however she is slightly apprehensive after eating it due to several days of intermittent nausea. ? ?Diarrhea: Much improved.  She was having 7-10 loose bowel movements with incontinence up to 2 days ago and now she has had only 2 bowel movements in the last 24 hours.  As mentioned above, GI pathogen panel and C. difficile is negative as well as flex sigmoidoscopy was negative. GI restarted on cholestyramine if needed. ? ?Hypokalemia: Resolved. ? ?GERD without esophagitis: Continue H2 blocker. ? ?Paroxysmal SVT: Continue home dose of metoprolol. ? ?Fever/community-acquired pneumonia: Overnight on 01/20/2022 patient had fever of 101.7.  Blood cultures were drawn.  Chest x-ray shows bilateral pleural effusion,  right greater than left, likely atelectasis but pneumonia cannot be ruled out.  Procalcitonin 0.47.  No other source of infection is obvious at this point in time.  CT abdomen pelvis was also done to hunt for an etiology which showed only gas in the gallbladder.  And some retroperitoneal fluid which likely is benign however per patient's request, I have reached out to GI to see patient again and explained those findings.  CT chest also indicates possibility of consolidation in the bilateral lower lobes.  Do not think that she has pneumonia and recommend antibiotics.  Initially patient was worried that antibiotics was the cause of diarrhea but even though we have continued antibiotics since yesterday, her diarrhea is improving so antibiotics likely not the cause of diarrhea.  Patient is agreeable with this plan.   ? ?DVT prophylaxis: enoxaparin (LOVENOX) injection 40 mg Start: 01/16/22 1000 ?  Code Status: Full Code  ?Family Communication: None present at bedside.  Plan of care discussed with patient in length and he/she verbalized understanding and agreed with it. ? ?Status is: Inpatient ?Remains inpatient appropriate because: Slowly advancing the diet. ? ? ?Estimated body mass index is 25.58 kg/m? as calculated from the following: ?  Height as of this encounter: '5\' 4"'$  (1.626 m). ?  Weight as of this encounter: 67.6 kg. ? ?  ?Nutritional Assessment: ?Body mass index is 25.58 kg/m?Marland KitchenMarland Kitchen ?Seen by dietician.  I agree with the assessment and plan as outlined below: ?Nutrition Status: ?  ?  ?  ? ?. ?Skin Assessment: ?I have examined the patient's skin and I agree with the wound assessment as performed by  the wound care RN as outlined below: ?  ? ?Consultants:  ?GI ? ?Procedures:  ?As above ? ?Antimicrobials:  ?Anti-infectives (From admission, onward)  ? ? Start     Dose/Rate Route Frequency Ordered Stop  ? 01/21/22 0800  cefTRIAXone (ROCEPHIN) 2 g in sodium chloride 0.9 % 100 mL IVPB       ? 2 g ?200 mL/hr over 30 Minutes  Intravenous Every 24 hours 01/21/22 0013 01/26/22 0759  ? 01/21/22 0100  azithromycin (ZITHROMAX) 500 mg in sodium chloride 0.9 % 250 mL IVPB       ? 500 mg ?250 mL/hr over 60 Minutes Intravenous Every 24 hours 01/21/22 0013 01/26/22 0059  ? 01/16/22 1000  cefTRIAXone (ROCEPHIN) 2 g in sodium chloride 0.9 % 100 mL IVPB  Status:  Discontinued       ? 2 g ?200 mL/hr over 30 Minutes Intravenous Every 24 hours 01/16/22 0934 01/20/22 1439  ? 01/16/22 1000  metroNIDAZOLE (FLAGYL) IVPB 500 mg  Status:  Discontinued       ? 500 mg ?100 mL/hr over 60 Minutes Intravenous Every 12 hours 01/16/22 0934 01/20/22 1439  ? ?  ?  ? ? ?Subjective: ? ?Patient seen and examined.  She feels better than yesterday.  Her cough is improving as well as shortness of breath usually during exertion.  Abdominal pain is almost resolved. ? ?Objective: ?Vitals:  ? 01/21/22 1541 01/21/22 1738 01/21/22 2051 01/22/22 8768  ?BP: 113/74  95/63 109/64  ?Pulse: 74  82 92  ?Resp: '16  18 18  '$ ?Temp: 98.9 ?F (37.2 ?C) (!) 100.6 ?F (38.1 ?C) 99.8 ?F (37.7 ?C) 99.6 ?F (37.6 ?C)  ?TempSrc: Oral Oral Oral Oral  ?SpO2: 98%  94% 93%  ?Weight:      ?Height:      ? ? ?Intake/Output Summary (Last 24 hours) at 01/22/2022 1153 ?Last data filed at 01/21/2022 2035 ?Gross per 24 hour  ?Intake 461.2 ml  ?Output --  ?Net 461.2 ml  ? ? ?Filed Weights  ? 01/15/22 1949 01/21/22 1324  ?Weight: 67.6 kg 67.6 kg  ? ? ?Examination: ? ?General exam: Appears calm and comfortable  ?Respiratory system: Slightly diminished breath sounds in the bases bilaterally, right greater than left, no rhonchi, wheezes or crackles. Respiratory effort normal. ?Cardiovascular system: S1 & S2 heard, RRR. No JVD, murmurs, rubs, gallops or clicks. No pedal edema. ?Gastrointestinal system: Abdomen is nondistended, soft and nontender. No organomegaly or masses felt. Normal bowel sounds heard. ?Central nervous system: Alert and oriented. No focal neurological deficits. ?Extremities: Symmetric 5 x 5 power. ?Skin:  No rashes, lesions or ulcers.  ?Psychiatry: Judgement and insight appear normal. Mood & affect appropriate.  ? ?Data Reviewed: I have personally reviewed following labs and imaging studies ? ?CBC: ?Recent Labs  ?Lab 01/16/22 ?0720 01/18/22 ?0336 01/20/22 ?0330 01/21/22 ?0805 01/22/22 ?0810  ?WBC 10.9* 11.9* 10.3 10.8* 9.8  ?NEUTROABS 8.8*  --  8.4* 8.3* 6.9  ?HGB 11.1* 11.6* 10.2* 10.8* 10.6*  ?HCT 34.5* 35.6* 30.8* 33.0* 31.9*  ?MCV 91.0 90.1 89.5 89.9 89.9  ?PLT 210 191 202 259 261  ? ? ?Basic Metabolic Panel: ?Recent Labs  ?Lab 01/16/22 ?0720 01/17/22 ?0336 01/18/22 ?0336 01/19/22 ?1225 01/20/22 ?0330 01/21/22 ?0322 01/21/22 ?0805 01/22/22 ?0810  ?NA 136   < > 138 138 139  --  137 135  ?K 3.9   < > 3.3* 2.9* 3.1*  --  3.9 3.6  ?CL 108   < > 107 104 109  --  105 104  ?CO2 24   < > '27 26 26  '$ --  24 18*  ?GLUCOSE 102*   < > 119* 106* 83  --  73 68*  ?BUN 14   < > '12 8 6  '$ --  7 <5*  ?CREATININE 0.55   < > 0.55 0.62 0.51  --  0.63 0.59  ?CALCIUM 7.7*   < > 7.9* 8.0* 7.6*  --  7.6* 7.7*  ?MG 2.1  --   --   --   --  1.9  --   --   ? < > = values in this interval not displayed.  ? ? ?GFR: ?Estimated Creatinine Clearance: 74.3 mL/min (by C-G formula based on SCr of 0.59 mg/dL). ?Liver Function Tests: ?Recent Labs  ?Lab 01/15/22 ?2017 01/16/22 ?0720 01/17/22 ?8127 01/18/22 ?5170 01/19/22 ?1225  ?AST 27 14* 13* 11* 17  ?ALT '23 16 14 11 15  '$ ?ALKPHOS 62 46 46 41 56  ?BILITOT 0.6 0.7 0.7 0.9 0.6  ?PROT 7.2 5.5* 5.6* 5.4* 5.9*  ?ALBUMIN 4.3 3.3* 3.2* 2.8* 3.0*  ? ? ?Recent Labs  ?Lab 01/15/22 ?2017 01/16/22 ?0720  ?LIPASE 40 48  ? ? ?No results for input(s): AMMONIA in the last 168 hours. ?Coagulation Profile: ?No results for input(s): INR, PROTIME in the last 168 hours. ?Cardiac Enzymes: ?No results for input(s): CKTOTAL, CKMB, CKMBINDEX, TROPONINI in the last 168 hours. ?BNP (last 3 results) ?No results for input(s): PROBNP in the last 8760 hours. ?HbA1C: ?No results for input(s): HGBA1C in the last 72 hours. ?CBG: ?No  results for input(s): GLUCAP in the last 168 hours. ?Lipid Profile: ?No results for input(s): CHOL, HDL, LDLCALC, TRIG, CHOLHDL, LDLDIRECT in the last 72 hours. ?Thyroid Function Tests: ?No results for input(s)

## 2022-01-22 NOTE — Plan of Care (Signed)
?  Problem: Health Behavior/Discharge Planning: ?Goal: Ability to manage health-related needs will improve ?01/22/2022 2012 by Zadie Rhine, RN ?Outcome: Progressing ?01/22/2022 2011 by Zadie Rhine, RN ?Outcome: Progressing ?  ?Problem: Clinical Measurements: ?Goal: Ability to maintain clinical measurements within normal limits will improve ?01/22/2022 2012 by Zadie Rhine, RN ?Outcome: Progressing ?01/22/2022 2011 by Zadie Rhine, RN ?Outcome: Progressing ?Goal: Will remain free from infection ?01/22/2022 2012 by Zadie Rhine, RN ?Outcome: Progressing ?01/22/2022 2011 by Zadie Rhine, RN ?Outcome: Progressing ?Goal: Diagnostic test results will improve ?01/22/2022 2012 by Zadie Rhine, RN ?Outcome: Progressing ?01/22/2022 2011 by Zadie Rhine, RN ?Outcome: Progressing ?  ?Problem: Activity: ?Goal: Risk for activity intolerance will decrease ?Outcome: Progressing ?  ?

## 2022-01-22 NOTE — Plan of Care (Signed)
?  Problem: Health Behavior/Discharge Planning: ?Goal: Ability to manage health-related needs will improve ?Outcome: Progressing ?  ?Problem: Clinical Measurements: ?Goal: Ability to maintain clinical measurements within normal limits will improve ?Outcome: Progressing ?Goal: Will remain free from infection ?Outcome: Progressing ?Goal: Diagnostic test results will improve ?Outcome: Progressing ?  ?Problem: Activity: ?Goal: Risk for activity intolerance will decrease ?Outcome: Progressing ?  ?

## 2022-01-23 ENCOUNTER — Inpatient Hospital Stay (HOSPITAL_COMMUNITY): Payer: 59

## 2022-01-23 ENCOUNTER — Ambulatory Visit: Payer: 59 | Admitting: Internal Medicine

## 2022-01-23 DIAGNOSIS — K859 Acute pancreatitis without necrosis or infection, unspecified: Secondary | ICD-10-CM | POA: Diagnosis not present

## 2022-01-23 DIAGNOSIS — I5030 Unspecified diastolic (congestive) heart failure: Secondary | ICD-10-CM

## 2022-01-23 DIAGNOSIS — J90 Pleural effusion, not elsewhere classified: Secondary | ICD-10-CM

## 2022-01-23 DIAGNOSIS — K9189 Other postprocedural complications and disorders of digestive system: Secondary | ICD-10-CM | POA: Diagnosis not present

## 2022-01-23 LAB — URINALYSIS, ROUTINE W REFLEX MICROSCOPIC
Bacteria, UA: NONE SEEN
Bilirubin Urine: NEGATIVE
Glucose, UA: NEGATIVE mg/dL
Ketones, ur: NEGATIVE mg/dL
Leukocytes,Ua: NEGATIVE
Nitrite: NEGATIVE
Protein, ur: NEGATIVE mg/dL
Specific Gravity, Urine: 1.01 (ref 1.005–1.030)
pH: 6 (ref 5.0–8.0)

## 2022-01-23 LAB — BODY FLUID CELL COUNT WITH DIFFERENTIAL
Eos, Fluid: 1 %
Lymphs, Fluid: 20 %
Monocyte-Macrophage-Serous Fluid: 19 % — ABNORMAL LOW (ref 50–90)
Neutrophil Count, Fluid: 60 % — ABNORMAL HIGH (ref 0–25)
Total Nucleated Cell Count, Fluid: 1178 cu mm — ABNORMAL HIGH (ref 0–1000)

## 2022-01-23 LAB — AMYLASE, PLEURAL OR PERITONEAL FLUID: Amylase, Fluid: 43 U/L

## 2022-01-23 LAB — AMYLASE: Amylase: 56 U/L (ref 28–100)

## 2022-01-23 LAB — LACTATE DEHYDROGENASE, PLEURAL OR PERITONEAL FLUID: LD, Fluid: 150 U/L — ABNORMAL HIGH (ref 3–23)

## 2022-01-23 LAB — BASIC METABOLIC PANEL
Anion gap: 8 (ref 5–15)
BUN: <5 mg/dL — ABNORMAL LOW (ref 6–20)
CO2: 27 mmol/L (ref 22–32)
Calcium: 7.8 mg/dL — ABNORMAL LOW (ref 8.9–10.3)
Chloride: 103 mmol/L (ref 98–111)
Creatinine, Ser: 0.58 mg/dL (ref 0.44–1.00)
GFR, Estimated: >60 mL/min (ref >60)
Glucose, Bld: 105 mg/dL — ABNORMAL HIGH (ref 70–99)
Potassium: 3.3 mmol/L — ABNORMAL LOW (ref 3.5–5.1)
Sodium: 138 mmol/L (ref 135–145)

## 2022-01-23 LAB — CBC
HCT: 35.4 % — ABNORMAL LOW (ref 36.0–46.0)
Hemoglobin: 11.3 g/dL — ABNORMAL LOW (ref 12.0–15.0)
MCH: 29.3 pg (ref 26.0–34.0)
MCHC: 31.9 g/dL (ref 30.0–36.0)
MCV: 91.7 fL (ref 80.0–100.0)
Platelets: 365 10*3/uL (ref 150–400)
RBC: 3.86 MIL/uL — ABNORMAL LOW (ref 3.87–5.11)
RDW: 14.2 % (ref 11.5–15.5)
WBC: 9.9 10*3/uL (ref 4.0–10.5)
nRBC: 0 % (ref 0.0–0.2)

## 2022-01-23 LAB — LACTIC ACID, PLASMA
Lactic Acid, Venous: 2.7 mmol/L (ref 0.5–1.9)
Lactic Acid, Venous: 1.3 mmol/L (ref 0.5–1.9)

## 2022-01-23 LAB — PROTEIN, PLEURAL OR PERITONEAL FLUID: Total protein, fluid: <3 g/dL

## 2022-01-23 LAB — ECHOCARDIOGRAM COMPLETE
AR max vel: 3.25 cm2
AV Peak grad: 5.8 mmHg
Ao pk vel: 1.21 m/s
Area-P 1/2: 7.29 cm2
Height: 64 in
S' Lateral: 3.25 cm
Weight: 2384.5 oz

## 2022-01-23 LAB — ALBUMIN, PLEURAL OR PERITONEAL FLUID: Albumin, Fluid: 1.6 g/dL

## 2022-01-23 LAB — GLUCOSE, CAPILLARY: Glucose-Capillary: 101 mg/dL — ABNORMAL HIGH (ref 70–99)

## 2022-01-23 LAB — GLUCOSE, PLEURAL OR PERITONEAL FLUID: Glucose, Fluid: 181 mg/dL

## 2022-01-23 LAB — LIPASE, BLOOD: Lipase: 32 U/L (ref 11–51)

## 2022-01-23 LAB — PROCALCITONIN: Procalcitonin: <0.1 ng/mL

## 2022-01-23 MED ORDER — SODIUM CHLORIDE 0.9 % IV SOLN
2.0000 g | Freq: Three times a day (TID) | INTRAVENOUS | Status: DC
  Administered 2022-01-23: 2 g via INTRAVENOUS
  Filled 2022-01-23 (×3): qty 12.5

## 2022-01-23 MED ORDER — DIPHENHYDRAMINE HCL 25 MG PO CAPS: 50.0000 mg | ORAL_CAPSULE | Freq: Once | ORAL | Status: AC

## 2022-01-23 MED ORDER — LACTATED RINGERS IV BOLUS
500.0000 mL | Freq: Once | INTRAVENOUS | Status: AC
  Administered 2022-01-23: 500 mL via INTRAVENOUS

## 2022-01-23 MED ORDER — PREDNISONE 50 MG PO TABS
50.0000 mg | ORAL_TABLET | Freq: Four times a day (QID) | ORAL | Status: AC
  Administered 2022-01-23 – 2022-01-24 (×3): 50 mg via ORAL
  Filled 2022-01-23 (×3): qty 1

## 2022-01-23 MED ORDER — CHOLESTYRAMINE LIGHT 4 G PO PACK
4.0000 g | PACK | ORAL | Status: DC | PRN
  Administered 2022-01-23: 4 g via ORAL
  Filled 2022-01-23 (×2): qty 1

## 2022-01-23 MED ORDER — POTASSIUM CHLORIDE CRYS ER 20 MEQ PO TBCR
40.0000 meq | EXTENDED_RELEASE_TABLET | Freq: Once | ORAL | Status: AC
Start: 2022-01-23 — End: 2022-01-23
  Administered 2022-01-23: 40 meq via ORAL
  Filled 2022-01-23: qty 2

## 2022-01-23 MED ORDER — RINGERS IV SOLN: INTRAVENOUS | Status: DC

## 2022-01-23 MED ORDER — VANCOMYCIN HCL 750 MG/150ML IV SOLN
750.0000 mg | Freq: Two times a day (BID) | INTRAVENOUS | Status: DC
Start: 2022-01-24 — End: 2022-01-23

## 2022-01-23 MED ORDER — LIDOCAINE HCL 1 % IJ SOLN
INTRAMUSCULAR | Status: AC
  Administered 2022-01-23: 10 mL
  Filled 2022-01-23: qty 20

## 2022-01-23 MED ORDER — METRONIDAZOLE 500 MG/100ML IV SOLN: 500.0000 mg | Freq: Two times a day (BID) | INTRAVENOUS | Status: DC

## 2022-01-23 MED ORDER — METRONIDAZOLE 500 MG/100ML IV SOLN
500.0000 mg | Freq: Two times a day (BID) | INTRAVENOUS | Status: DC
  Administered 2022-01-23 – 2022-01-25 (×4): 500 mg via INTRAVENOUS
  Filled 2022-01-23 (×4): qty 100

## 2022-01-23 MED ORDER — AZITHROMYCIN 250 MG PO TABS: 500.0000 mg | ORAL_TABLET | Freq: Every day | ORAL | Status: DC

## 2022-01-23 MED ORDER — DIPHENHYDRAMINE HCL 50 MG/ML IJ SOLN
50.0000 mg | Freq: Once | INTRAMUSCULAR | Status: AC
  Administered 2022-01-24: 50 mg via INTRAVENOUS
  Filled 2022-01-23 (×2): qty 1

## 2022-01-23 MED ORDER — ALBUTEROL SULFATE (2.5 MG/3ML) 0.083% IN NEBU
2.5000 mg | INHALATION_SOLUTION | Freq: Once | RESPIRATORY_TRACT | Status: AC
  Administered 2022-01-23: 2.5 mg via RESPIRATORY_TRACT
  Filled 2022-01-23: qty 3

## 2022-01-23 MED ORDER — VANCOMYCIN HCL 1500 MG/300ML IV SOLN
1500.0000 mg | Freq: Once | INTRAVENOUS | Status: AC
  Administered 2022-01-23: 1500 mg via INTRAVENOUS
  Filled 2022-01-23: qty 300

## 2022-01-23 MED ORDER — SUCRALFATE 1 GM/10ML PO SUSP
1.0000 g | Freq: Three times a day (TID) | ORAL | Status: DC
  Administered 2022-01-23: 1 g via ORAL
  Filled 2022-01-23 (×2): qty 10

## 2022-01-23 MED ORDER — BOOST / RESOURCE BREEZE PO LIQD CUSTOM
1.0000 | Freq: Three times a day (TID) | ORAL | Status: DC
  Administered 2022-01-23 (×3): 1 via ORAL

## 2022-01-23 NOTE — Progress Notes (Addendum)
PROGRESS NOTE    Laurie Terry  NWG:[id number] DOB: 1966-03-22 DOA: 01/15/2022 PCP: [name], MD   Brief Narrative: Very pleasant 56 year old with past medical history significant for melanoma, paroxysmal SVT, thoracic aortic aneurysm and other comorbidities presented to the ED complaining of abdominal pain after having outpatient ERCP. Patient was admitted for further evaluation, initially she was thought to have acute pancreatitis post ERCP.  Upper GI series and was negative for biliary leak or any duodenal leak. Per Dr. Ewing Schlein, Patient might had small retroperitoneal perforation. GI continue to follow.   Hospital course complicated by fever thought to be related to possible pneumonia, Bilateral Pleural effusion, worse on the right, Diarrhea of unclear etiology. Possible allergic reaction to Colestid.    Assessment & Plan:   Principal Problem:   Post-ERCP acute pancreatitis Active Problems:   Choledocholithiasis   Paroxysmal SVT (supraventricular tachycardia) (HCC)   GERD without esophagitis   Acute pancreatitis  1-Post ERCP Complication; ? Pancreatitis vs Small retroperitoneal perforation.  CT scan abdomen pelvis 5/01; Increasing loculated gas and fluid in the gallbladder fossa. New right retroperitoneal fluid inferior to the liver and lateral to kidney.  GI following. Will follow recommendation, patient spiking fevers. .  -Started on Carafate by GI today for worsening epigastric pain.  -check lipase.   2-BL pleural effusion worse on the right, PNA:  -Chest x ray 4/30: New increasing effusion bilaterally, right greater than left, with underline atelectatic changes.  -Started on IV ceftriaxone and Azithromycin on 5/01. -Pulmonary consulted for further evaluation of pleural effusion.  -Dr Gordy Levan recommended thoracentesis and ECHO.  -Thoracentesis today.  -Spike fever, worsening cough--- Plan to change IV antibiotics to Vancomycin and Cefepime.   3-Diarrhea;   Unclear etiology.  C diff and GI pathogen negative.  S/P Flexible sigmoidoscopy 5/01: Normal mucosa. Small polyp removed. Biopsy negative for inflammatory bowel diseases, microscopic Colitis and collagenous colitis.  Started on Cholestyramine.  She had 6 BM yesterday. Today so far 1 after eating.   4-Fever;  Could be related to PNA.  Discussed with Dr Gordy Levan, Plan to proceed with  pan-culture; UA, urine culture, blood culture.  Check lactic acid, pro-calcitonin. Broad antibiotics spectrum to Vancomycin and Cefepime. .   Hypokalemia; replete orally.   Paroxysmal SVT; continue with metoprolol.   Addendum 6:50 PM  I came to speak with patient about her concern for CT scan with contrast. She report to me new different cough, associated with some SOB for last 5- 10 minute. Cough started  after she completed last IV antibiotics (Cefepime). She is able to speak in full sentences, she doesn't have any tongue or lips swelling. She reported some tingling around her lips. She does have mild redness of her ears.  Lung exam BL air movement, no wheezing.  -Plan to proceed with prednisone and benadryl already order for pre medication for CT scan.  -Plan to discontinue IV Cefepime and Vancomycin.  -She has received already IV ceftriaxone today. Added flagyl to cover for intra abdominal infection.  -Will order Albuterol PRN.  -Will continue with IV antibiotics to cover for intra-abdominal source. Will consult ID tomorrow.  -Dr Fredia Sorrow and Dr Bosie Clos will speak with patient tomorrow prior to proceeding with CT scan with contrast.   Came back to check on patient and she is feeling better, cough has subside.     Estimated body mass index is 25.58 kg/m as calculated from the following:   Height as of this encounter: 5\' 4"  (1.626 m).  Weight as of this encounter: 67.6 kg.   DVT prophylaxis: Lovenox Code Status: Full code Family Communication: care discussed with patient.  Disposition Plan:   Status is: Inpatient Remains inpatient appropriate because: remain in the hospital for management of Fever, pleural effusion.     Consultants:  [facility]  Procedures:    Antimicrobials:    Subjective: She report more epigastric pain today after eating. She report right side chest, back pain, specially when she takes deep breath. Cough is worse this afternoon.  She had 6 BM yesterday. She had one watery mix with pieces feces after eating today.    Objective: Vitals:   01/23/22 0031 01/23/22 0542 01/23/22 1120 01/23/22 1246  BP:  109/62  (!) 111/56  Pulse:  88  88  Resp:  20  18  Temp: 99.3 F (37.4 C) 99.6 F (37.6 C) 98.4 F (36.9 C) 100 F (37.8 C)  TempSrc:  Oral  Oral  SpO2:  93%  97%  Weight:      Height:        Intake/Output Summary (Last 24 hours) at 01/23/2022 1329 Last data filed at 01/23/2022 0900 Gross per 24 hour  Intake 220 ml  Output --  Net 220 ml   Filed Weights   01/15/22 1949 01/21/22 1324  Weight: 67.6 kg 67.6 kg    Examination:  General exam: Appears calm and comfortable  Respiratory system: BL crackles.  Cardiovascular system: S1 & S2 heard, RRR.  Gastrointestinal system: Abdomen is nondistended, soft and mild tender. Central nervous system: Alert and oriented. No focal neurological deficits. Extremities: Symmetric 5 x 5 power.    Data Reviewed: I have personally reviewed following labs and imaging studies  CBC: Recent Labs  Lab 01/18/22 0336 01/20/22 0330 01/21/22 0805 01/22/22 0810  WBC 11.9* 10.3 10.8* 9.8  NEUTROABS  --  8.4* 8.3* 6.9  HGB 11.6* 10.2* 10.8* 10.6*  HCT 35.6* 30.8* 33.0* 31.9*  MCV 90.1 89.5 89.9 89.9  PLT 191 202 259 261   Basic Metabolic Panel: Recent Labs  Lab 01/19/22 1225 01/20/22 0330 01/21/22 0322 01/21/22 0805 01/22/22 0810 01/23/22 0739  NA 138 139  --  137 135 138  K 2.9* 3.1*  --  3.9 3.6 3.3*  CL 104 109  --  105 104 103  CO2 26 26  --  24 18* 27  GLUCOSE 106* 83  --  73 68* 105*   BUN 8 6  --  7 <5* <5*  CREATININE 0.62 0.51  --  0.63 0.59 0.58  CALCIUM 8.0* 7.6*  --  7.6* 7.7* 7.8*  MG  --   --  1.9  --   --   --    GFR: Estimated Creatinine Clearance: 74.3 mL/min (by C-G formula based on SCr of 0.58 mg/dL). Liver Function Tests: Recent Labs  Lab 01/17/22 0336 01/18/22 0336 01/19/22 1225  AST 13* 11* 17  ALT 14 11 15   ALKPHOS 46 41 56  BILITOT 0.7 0.9 0.6  PROT 5.6* 5.4* 5.9*  ALBUMIN 3.2* 2.8* 3.0*   No results for input(s): LIPASE, AMYLASE in the last 168 hours. No results for input(s): AMMONIA in the last 168 hours. Coagulation Profile: No results for input(s): INR, PROTIME in the last 168 hours. Cardiac Enzymes: No results for input(s): CKTOTAL, CKMB, CKMBINDEX, TROPONINI in the last 168 hours. BNP (last 3 results) No results for input(s): PROBNP in the last 8760 hours. HbA1C: No results for input(s): HGBA1C in the last 72  hours. CBG: Recent Labs  Lab 01/23/22 0808  GLUCAP 101*   Lipid Profile: No results for input(s): CHOL, HDL, LDLCALC, TRIG, CHOLHDL, LDLDIRECT in the last 72 hours. Thyroid Function Tests: No results for input(s): TSH, T4TOTAL, FREET4, T3FREE, THYROIDAB in the last 72 hours. Anemia Panel: No results for input(s): VITAMINB12, FOLATE, FERRITIN, TIBC, IRON, RETICCTPCT in the last 72 hours. Sepsis Labs: Recent Labs  Lab 01/21/22 0924  PROCALCITON 0.43    Recent Results (from the past 240 hour(s))  Gastrointestinal Panel by PCR , Stool     Status: None   Collection Time: 01/18/22  1:28 PM   Specimen: Stool  Result Value Ref Range Status   Campylobacter species NOT DETECTED NOT DETECTED Final   Plesimonas shigelloides NOT DETECTED NOT DETECTED Final   Salmonella species NOT DETECTED NOT DETECTED Final   Yersinia enterocolitica NOT DETECTED NOT DETECTED Final   Vibrio species NOT DETECTED NOT DETECTED Final   Vibrio cholerae NOT DETECTED NOT DETECTED Final   Enteroaggregative E coli (EAEC) NOT DETECTED NOT DETECTED  Final   Enteropathogenic E coli (EPEC) NOT DETECTED NOT DETECTED Final   Enterotoxigenic E coli (ETEC) NOT DETECTED NOT DETECTED Final   Shiga like toxin producing E coli (STEC) NOT DETECTED NOT DETECTED Final   Shigella/Enteroinvasive E coli (EIEC) NOT DETECTED NOT DETECTED Final   Cryptosporidium NOT DETECTED NOT DETECTED Final   Cyclospora cayetanensis NOT DETECTED NOT DETECTED Final   Entamoeba histolytica NOT DETECTED NOT DETECTED Final   Giardia lamblia NOT DETECTED NOT DETECTED Final   Adenovirus F40/41 NOT DETECTED NOT DETECTED Final   Astrovirus NOT DETECTED NOT DETECTED Final   Norovirus GI/GII NOT DETECTED NOT DETECTED Final   Rotavirus A NOT DETECTED NOT DETECTED Final   Sapovirus (I, II, IV, and V) NOT DETECTED NOT DETECTED Final    Comment: Performed at [facility], [address] [place], [place] [address]  C Difficile Quick Screen (NO PCR Reflex)     Status: None   Collection Time: 01/18/22  1:28 PM   Specimen: Stool  Result Value Ref Range Status   C Diff antigen NEGATIVE NEGATIVE Final   C Diff toxin NEGATIVE NEGATIVE Final   C Diff interpretation No C. difficile detected.  Final    Comment: Performed at [facility], [address], [place], [place] [address]  Culture, blood (Routine X 2) w Reflex to ID Panel     Status: None (Preliminary result)   Collection Time: 01/20/22 10:58 PM   Specimen: BLOOD  Result Value Ref Range Status   Specimen Description   Final    BLOOD RIGHT ANTECUBITAL Performed at [facility], [address] [place], [place] [address]    Special Requests   Final    BOTTLES DRAWN AEROBIC AND ANAEROBIC Blood Culture adequate volume Performed at [facility], [address] [place], [place] [address]    Culture   Final    NO GROWTH 2 DAYS Performed at [facility], [address], [place], [place] [address]    Report Status PENDING  Incomplete  Culture, blood  (Routine X 2) w Reflex to ID Panel     Status: None (Preliminary result)   Collection Time: 01/20/22 11:09 PM   Specimen: BLOOD  Result Value Ref Range Status   Specimen Description   Final    BLOOD LEFT ANTECUBITAL Performed at [facility], [address], [place], [place] [address]  Special Requests   Final    BOTTLES DRAWN AEROBIC ONLY Blood Culture adequate volume Performed at [facility], [address] [place], [place] [address]    Culture   Final    NO GROWTH 2 DAYS Performed at [facility], [address], [place], [place] [address]    Report Status PENDING  Incomplete         Radiology Studies: CT ABDOMEN PELVIS WO CONTRAST  Result Date: 01/21/2022 CLINICAL DATA:  Abdominal pain, nausea.  Recent lipase is normal. EXAM: CT ABDOMEN AND PELVIS WITHOUT CONTRAST TECHNIQUE: Multidetector CT imaging of the abdomen and pelvis was performed following the standard protocol without IV contrast. RADIATION DOSE REDUCTION: This exam was performed according to the departmental dose-optimization program which includes automated exposure control, adjustment of the mA and/or kV according to patient size and/or use of iterative reconstruction technique. COMPARISON:  01/15/2022 FINDINGS: Lower chest: New moderate right and smaller left pleural effusions, with adjacent atelectasis/consolidation in the lower lobes. Hepatobiliary: Cholecystectomy clips. Gas in the biliary tree which is nondilated. Poorly marginated loculated gas and fluid noted in the gallbladder fossa, increased since prior CT. No new liver lesion. Pancreas: Unremarkable. No pancreatic ductal dilatation or surrounding inflammatory changes. Spleen: Normal in size without focal abnormality. Adrenals/Urinary Tract: No adrenal mass. Left kidney remains unremarkable. 1 mm calculus in the upper pole right renal collecting system without hydronephrosis or ureterectasis. Urinary bladder  physiologically distended. Stomach/Bowel: Stomach decompressed. Small bowel is nondistended. Appendix not identified. The colon is nondilated, unremarkable. Vascular/Lymphatic: No significant vascular findings are present. No enlarged abdominal or pelvic lymph nodes. Reproductive: Uterus and bilateral adnexa are unremarkable. Other: Bilateral pelvic phleboliths. Small volume pelvic ascites. No free air. Since the prior scan there has been significant development of right retroperitoneal fluid inferior to the liver and lateral to the right kidney. Musculoskeletal: Mild spondylitic changes in the lower lumbar spine. IMPRESSION: 1. Increasing loculated gas and fluid in the gallbladder fossa. 2. New right retroperitoneal fluid inferior to the liver and lateral to the kidney. 3. New moderate  right and small left pleural effusions. 4. Small volume pelvic ascites, new since prior study. 5. Nonobstructive right urolithiasis. Electronically Signed   By: Corlis Leak M.D.   On: 01/21/2022 17:40   ECHOCARDIOGRAM COMPLETE  Result Date: 01/23/2022    ECHOCARDIOGRAM REPORT   Patient Name:   Laurie Terry [facility] Date of Exam: 01/23/2022 Medical Rec #:  [id number]           Height:       64.0 in Accession #:    [id number]          Weight:       149.0 lb Date of Birth:  08-11-1966           BSA:          1.726 m Patient Age:    55 years            BP:           109/62 mmHg Patient Gender: F                   HR:           89 bpm. Exam Location:  Inpatient Procedure: 2D Echo, Cardiac Doppler and Color Doppler Indications:     CHF  History:         Patient has prior history of Echocardiogram examinations, most  recent 05/24/2021.  Sonographer:     Eduard Roux Referring Phys:  [id number] Kalman Shan Diagnosing Phys: Donato Schultz MD IMPRESSIONS  1. Left ventricular ejection fraction, by estimation, is 60 to 65%. The left ventricle has normal function. The left ventricle has no regional wall motion abnormalities. Left  ventricular diastolic parameters are consistent with Grade I diastolic dysfunction (impaired relaxation).  2. Right ventricular systolic function is normal. The right ventricular size is normal.  3. The mitral valve is normal in structure. No evidence of mitral valve regurgitation. No evidence of mitral stenosis.  4. The aortic valve is normal in structure. Aortic valve regurgitation is not visualized. No aortic stenosis is present.  5. The inferior vena cava is dilated in size with <50% respiratory variability, suggesting right atrial pressure of 15 mmHg. FINDINGS  Left Ventricle: Left ventricular ejection fraction, by estimation, is 60 to 65%. The left ventricle has normal function. The left ventricle has no regional wall motion abnormalities. The left ventricular internal cavity size was normal in size. There is  no left ventricular hypertrophy. Left ventricular diastolic parameters are consistent with Grade I diastolic dysfunction (impaired relaxation). Right Ventricle: The right ventricular size is normal. No increase in right ventricular wall thickness. Right ventricular systolic function is normal. Left Atrium: Left atrial size was normal in size. Right Atrium: Right atrial size was normal in size. Pericardium: There is no evidence of pericardial effusion. Mitral Valve: The mitral valve is normal in structure. No evidence of mitral valve regurgitation. No evidence of mitral valve stenosis. Tricuspid Valve: The tricuspid valve is normal in structure. Tricuspid valve regurgitation is trivial. No evidence of tricuspid stenosis. Aortic Valve: The aortic valve is normal in structure. Aortic valve regurgitation is not visualized. No aortic stenosis is present. Aortic valve peak gradient measures 5.8 mmHg. Pulmonic Valve: The pulmonic valve was normal in structure. Pulmonic valve regurgitation is not visualized. No evidence of pulmonic stenosis. Aorta: The aortic root is normal in size and structure. Venous: The  inferior vena cava is dilated in size with less than 50% respiratory variability, suggesting right atrial pressure of 15 mmHg. IAS/Shunts: No atrial level shunt detected by color flow Doppler.  LEFT VENTRICLE PLAX 2D LVIDd:         4.50 cm   Diastology LVIDs:         3.25 cm   LV e' medial:    10.90 cm/s LV PW:         0.90 cm   LV E/e' medial:  7.8 LV IVS:        0.90 cm   LV e' lateral:   10.10 cm/s LVOT diam:     2.10 cm   LV E/e' lateral: 8.4 LV SV:         71 LV SV Index:   41 LVOT Area:     3.46 cm  RIGHT VENTRICLE             IVC RV Basal diam:  2.90 cm     IVC diam: 2.10 cm RV S prime:     14.20 cm/s TAPSE (M-mode): 2.4 cm LEFT ATRIUM             Index        RIGHT ATRIUM           Index LA diam:        2.90 cm 1.68 cm/m   RA Area:     13.20 cm LA Vol (A2C):   44.4 ml  25.72 ml/m  RA Volume:   33.00 ml  19.11 ml/m LA Vol (A4C):   48.2 ml 27.92 ml/m LA Biplane Vol: 46.1 ml 26.70 ml/m  AORTIC VALVE                 PULMONIC VALVE AV Area (Vmax): 3.25 cm     PV Vmax:       0.79 m/s AV Vmax:        120.50 cm/s  PV Peak grad:  2.5 mmHg AV Peak Grad:   5.8 mmHg LVOT Vmax:      113.00 cm/s LVOT Vmean:     73.900 cm/s LVOT VTI:       0.205 m  AORTA Ao Root diam: 3.90 cm Ao Asc diam:  3.90 cm MITRAL VALVE MV Area (PHT): 7.29 cm    SHUNTS MV Decel Time: 104 msec    Systemic VTI:  0.20 m MV E velocity: 84.90 cm/s  Systemic Diam: 2.10 cm MV A velocity: 85.40 cm/s MV E/A ratio:  0.99 Donato Schultz MD Electronically signed by Donato Schultz MD Signature Date/Time: 01/23/2022/1:13:41 PM    Final (Updated)         Scheduled Meds:  azithromycin  500 mg Oral QHS   enoxaparin (LOVENOX) injection  40 mg Subcutaneous Q24H   famotidine  20 mg Oral Daily   feeding supplement  1 Container Oral TID BM   sucralfate  1 g Oral TID WC & HS   Continuous Infusions:  cefTRIAXone (ROCEPHIN)  IV 2 g (01/23/22 0805)     LOS: 7 days    Time spent: 35 minutes     Anasofia Micallef A Laterrance Nauta, MD [facility]   If 7PM-7AM,  please contact night-coverage [email or web address]  01/23/2022, 1:29 PM

## 2022-01-23 NOTE — Progress Notes (Signed)
Pharmacy Antibiotic Note ? ?Laurie Terry is a 56 y.o. female admitted on 01/15/2022 with acute pancreatitis s/p ERCP.  Patient originally started on Cefriaxone and Metronidazole (4/26-4/30), continued on Ceftriaxone and Azithromycin added on 5/1.  Today Ceftriaxone d/c'ed and Pharmacy has been consulted for Vancomycin and Cefepime dosing for pneumonia. ? ? ?Plan: ?Cefepime 2gm IV q8h ?Vancomycin '1500mg'$  IV x 1 followed by Vancomycin 750 mg IV Q 12 hrs. Goal AUC 400-550.  Expected AUC: 507.9  SCr used: 0.8 (rounded up from 0.58) ?Continue Azithromycin per MD ? ?Height: '5\' 4"'$  (162.6 cm) ?Weight: 67.6 kg (149 lb 0.5 oz) ?IBW/kg (Calculated) : 54.7 ? ?Temp (24hrs), Avg:100.2 ?F (37.9 ?C), Min:98.4 ?F (36.9 ?C), Max:101.9 ?F (38.8 ?C) ? ?Recent Labs  ?Lab 01/18/22 ?0336 01/19/22 ?1225 01/20/22 ?0330 01/21/22 ?0805 01/22/22 ?5176 01/23/22 ?0739  ?WBC 11.9*  --  10.3 10.8* 9.8  --   ?CREATININE 0.55 0.62 0.51 0.63 0.59 0.58  ?  ?Estimated Creatinine Clearance: 74.3 mL/min (by C-G formula based on SCr of 0.58 mg/dL).   ? ?Allergies  ?Allergen Reactions  ? Contrast Media [Iodinated Contrast Media] Shortness Of Breath  ?  Low blood pressure   ? Iodine Shortness Of Breath and Swelling  ? Other Hives and Swelling  ?  Seafood  ? Ampicillin Hives  ?  Childhood allergy  ? Avocado Itching  ?  Tongue and mouth  ? Levofloxacin Hives  ?  Pt already has aortic aneurysm.  ? Pantoprazole Other (See Comments)  ?  Joint aches, fever, peeling of skin  ? Septra [Bactrim] Hives  ? Sulfamethoxazole-Trimethoprim Other (See Comments) and Hives  ? Colestipol Hcl Rash  ? ? ?Antimicrobials this admission: ?4/26 Metronidazole >> 4/30 ?4/26 Ceftriaxone >> 5/3 ?5/1 Azithromycin >> ?5/3 Cefepime >> ?5/3 Vancomycin >> ? ?Dose adjustments this admission: ?  ? ?Microbiology results: ?4/30 BCx: NGTD ?4/30 UCx:    ?4/28 GI Panel PCR: negative   ?4/28 CDiff: negative ? ?Thank you for allowing pharmacy to be a part of this patient?s care. ? ?Everette Rank, PharmD ?01/23/2022 2:10 PM ? ?

## 2022-01-23 NOTE — Progress Notes (Signed)
[facility] Progress Note ? ?Laurie Terry 56 y.o. 1965-10-02 ? ?CC:   Diarrhea, post ERCP pancreatitis ? ? ?Subjective: ?Patient seen and examined laying in bed.  That she is having increased epigastric and right upper quadrant pain that is not constant.  She continues to have diarrhea after every bowel movement.  Notes she is feeling chills.  No documented fever. ? ?ROS : Review of Systems  ?Gastrointestinal:  Positive for abdominal pain and diarrhea. Negative for blood in stool, constipation, heartburn, melena, nausea and vomiting.  ?Genitourinary:  Negative for dysuria and frequency.  ? ? ? ?Objective: ?Vital signs in last 24 hours: ?Vitals:  ? 01/23/22 1120 01/23/22 1246  ?BP:  (!) 111/56  ?Pulse:  88  ?Resp:  18  ?Temp: 98.4 ?F (36.9 ?C) 100 ?F (37.8 ?C)  ?SpO2:  97%  ? ? ?Physical Exam: ? ?General:  Alert, cooperative, no distress, appears stated age  ?Head:  Normocephalic, without obvious abnormality, atraumatic  ?Eyes:  Anicteric sclera, EOM's intact  ?Lungs:   Clear to auscultation bilaterally, respirations unlabored  ?Heart:  Regular rate and rhythm, S1, S2 normal  ?Abdomen:   Soft, nondistended, mildly tender to epigastric and right upper quadrant, bowel sounds active all four quadrants,  no masses,   ?Extremities: Extremities normal, atraumatic, no  edema  ?Pulses: 2+ and symmetric  ? ? ?Lab Results: ?Recent Labs  ?  01/21/22 ?0322 01/21/22 ?0805 01/22/22 ?0321 01/23/22 ?0739  ?NA  --    < > 135 138  ?K  --    < > 3.6 3.3*  ?CL  --    < > 104 103  ?CO2  --    < > 18* 27  ?GLUCOSE  --    < > 68* 105*  ?BUN  --    < > <5* <5*  ?CREATININE  --    < > 0.59 0.58  ?CALCIUM  --    < > 7.7* 7.8*  ?MG 1.9  --   --   --   ? < > = values in this interval not displayed.  ? ?No results for input(s): AST, ALT, ALKPHOS, BILITOT, PROT, ALBUMIN in the last 72 hours. ?Recent Labs  ?  01/21/22 ?0805 01/22/22 ?0810  ?WBC 10.8* 9.8  ?NEUTROABS 8.3* 6.9  ?HGB 10.8* 10.6*  ?HCT 33.0* 31.9*  ?MCV 89.9 89.9   ?PLT 259 261  ? ?No results for input(s): LABPROT, INR in the last 72 hours. ? ? ? ?Assessment ?Pancreatitis ?Abdominal pain ?Diarrhea ?pneumonia ?  ?Abdominal pain worsening will recheck lipase.  Pancreatitis likely due to to recent ERCP. possible abdominal pain due to gastritis. ?  ?Hemoglobin stable at 10.6 ?  ?Diarrhea continues to be the same as previously.  Previously had possible allergic reaction to Colestid. GI pathogen panel and C. difficile negative.  Possible diarrhea due to antibiotics.  ?Patient is currently on azithromycin and ceftriaxone for concern of community-acquired pneumonia.  ?  ?Flex sigmoidoscopy 01/21/2022 ?Fair colon prep ?Entire examined colon normal, no evidence of microscopic colitis or IBD ?One 3 mm hyperplastic polyp in the rectum removed ? ?Plan: ?Continue supportive care ?We will add cholestyramine as needed for diarrhea.  We will schedule 1 dose today.  ?Possible abdominal pain due to gastritis we will add sucralfate 3 times daily.  Check lipase. ?continue soft diet. ?[facility] will follow. ? ?Charlott Rakes PA-C ?01/23/2022, 1:23 PM ? ?Contact #  [phone]  ?

## 2022-01-23 NOTE — Procedures (Signed)
PROCEDURE SUMMARY: ? ?Successful US guided right thoracentesis. ?Yielded 300cc of yellow pleural fluid fluid. ?Pt tolerated procedure well. ?No immediate complications. ? ?Specimen was sent for labs. ?CXR ordered. ? ?EBL < 5 mL ? ?Maxum Cassarino PA-C ?01/23/2022 ?2:31 PM ? ? ? ?

## 2022-01-23 NOTE — Consult Note (Signed)
? ?NAME:  Laurie Terry, MRN:  [id number], DOB:  1966/04/06, LOS: 7 ?ADMISSION DATE:  01/15/2022, CONSULTATION DATE:  01/23/2022 ?REFERRING MD:  Dr Niel Hummer, CHIEF COMPLAINT:  Pleural effusion R > L ? ? ?History of Present Illness:  ? ?56 year old female who works in [place].  She has a past medical history of melanoma status post removal 2000, paroxysmal SVT, thoracic aortic aneurysm followed by Dr. Glenford Bayley, multiple episodes of gallstone pancreatitis status postcholecystectomy in 2017 which was then complicated biliary leak according to history. ? ?Underwent ERCP on 01/15/2022 for abdominal pain and successfully retrieving a common bile duct stone.  Discharged the same day but returned and got admitted on 01/16/2022 with epigastric abdominal pain.  CT imaging of the chest abdomen and pelvis showed mild intra and extrahepatic pneumobilia with punctate foci of gas in the hepatic hilum.  There is also infiltrative fluid adjacent to the pancreas head and near the second portion of the duodenum.  Pancreatitis was considered as etiology. ? ?Course in the hospital ?-On and off temperature of 101 Fahrenheit ongoing since 01/18/2022 with Tmax 101.9 on 01/22/2022 but relatively normal white count. ? ?-01/16/2022: Concern for pancreatitis.  Normal white count.  Still with pain.  Perforation considered less likely.  Gastrografin upper GI series -no evidence of duodenal leak or perforation.  Small gas bubbles in the common bile duct.  Lipase level normal.  Treated with fluids and antibiotics ? ?-01/18/2022: Postprandial diarrhea 7 episodes noted.?  Bile salt induced diarrhea versus antibiotic induced diarrhea.  GI pathogen panel ultimately negative for infectious causes. ? ?-01/19/2022: Continued diarrhea.  Started Colestid.  Did have some swallowing and hives difficulty after starting Colestid ? ? ?-01/20/2022: Improving pain but still with intermittent nausea but also ongoing significant diarrhea which is now the dominant  complaint.  Profuse watery diarrhea. ? ?-01/21/2022: Flexible sigmoidoscopy examined colon was normal.  3 mm polyp in the rectum biopsy done.  CT abdomen 01/21/2022 shows right greater than left pleural effusion [new since admission ? ?-01/22/2022: Abdominal pain improving.  Pain thought to be due to ERCP complication small retroperitoneal perforation but currently improved.  Diarrhea thought to be improving on antibiotics ceftriaxone and azithromycin for concern of community-acquired pneumonia.  Thought process on diarrhea change to antibiotic related versus possible microscopic colitis.   ? ?-01/23/2022: Chest x-ray with right pleural effusion.  Abdominal pain seems to be getting worse again.  Diarrhea continues.  On and off fever for several days.  White count normal.  Pulmonary consult called for pleural effusion. ? ?Past Medical History:  ? ? has a past medical history of Allergic rhinitis, Aortic aneurysm ([place]) (09/23/2020), Cancer [facility]), Cervical dysplasia (2007), Family history of adverse reaction to anesthesia, Intermittent palpitations, IUD, Ocular migraine, Osteomalacia, Pancreatitis, and Seafood allergy. ? ? reports that she has quit smoking. She has never used smokeless tobacco. ? ?Past Surgical History:  ?Procedure Laterality Date  ? BIOPSY  01/21/2022  ? Procedure: BIOPSY;  Surgeon: Wilford Corner, MD;  Location: [facility];  Service: Gastroenterology;;  ? Lillard Anes  2018  ? CHOLECYSTECTOMY N/A 04/09/2016  ? Procedure: LAPAROSCOPIC CHOLECYSTECTOMY ;  Surgeon: Rolm Bookbinder, MD;  Location: [facility];  Service: General;  Laterality: N/A;  ? ERCP  04/11/2016  ? ERCP with pancreatic stent and failed CBD stent after needle knife and conventional sphincterotomy  ? ERCP N/A 04/12/2016  ? Procedure: ENDOSCOPIC RETROGRADE CHOLANGIOPANCREATOGRAPHY (ERCP);  Surgeon: Clarene Essex, MD;  Location: [facility];  Service: Endoscopy;  Laterality: N/A;  ?  ERCP N/A 01/15/2022  ? Procedure: ENDOSCOPIC RETROGRADE  CHOLANGIOPANCREATOGRAPHY (ERCP);  Surgeon: Clarene Essex, MD;  Location: [facility];  Service: Gastroenterology;  Laterality: N/A;  ? FLEXIBLE SIGMOIDOSCOPY N/A 01/21/2022  ? Procedure: FLEXIBLE SIGMOIDOSCOPY;  Surgeon: Wilford Corner, MD;  Location: [facility];  Service: Gastroenterology;  Laterality: N/A;  ? FOOT SURGERY    ? toenail  ? INTRAUTERINE DEVICE INSERTION  08/07/2017  ? Mirena  ? removal of melanoma    ? REMOVAL OF STONES  01/15/2022  ? Procedure: REMOVAL OF STONES;  Surgeon: Clarene Essex, MD;  Location: [facility];  Service: Gastroenterology;;  ? [name]  01/15/2022  ? Procedure: SPHINCTEROTOMY;  Surgeon: Clarene Essex, MD;  Location: [facility];  Service: Gastroenterology;;  ? ? ?Allergies  ?Allergen Reactions  ? Contrast Media [Iodinated Contrast Media] Shortness Of Breath  ?  Low blood pressure   ? Iodine Shortness Of Breath and Swelling  ? Other Hives and Swelling  ?  Seafood  ? Ampicillin Hives  ?  Childhood allergy  ? Avocado Itching  ?  Tongue and mouth  ? Levofloxacin Hives  ?  Pt already has aortic aneurysm.  ? Pantoprazole Other (See Comments)  ?  Joint aches, fever, peeling of skin  ? Septra [Bactrim] Hives  ? Sulfamethoxazole-Trimethoprim Other (See Comments) and Hives  ? Colestipol Hcl Rash  ? ? ?Immunization History  ?Administered Date(s) Administered  ? Hepatitis A, Adult 11/24/2015  ? Influenza,inj,Quad PF,6+ Mos 06/08/2017  ? Influenza-Unspecified 06/24/2015, 07/15/2019  ? Tdap 01/03/2006, 11/24/2015  ? Typhoid Inactivated 11/24/2015  ? ? ?Family History  ?Problem Relation Age of Onset  ? Tremor Mother   ? Stroke Father   ? Autoimmune disease Father   ? CVA Father   ? Breast cancer Sister 67  ? Arthritis Sister   ?     Psoriatic  ? Thyroid disease Sister   ? Breast cancer Maternal Grandmother   ?     Age 3  ? Thyroid disease Daughter   ?     Hypo  ? ? ? ?Current Facility-Administered Medications:  ?  acetaminophen (TYLENOL) tablet 650 mg, 650 mg, Oral, Q6H PRN, 650 mg at  01/23/22 1246 **OR** acetaminophen (TYLENOL) suppository 650 mg, 650 mg, Rectal, Q6H PRN, Wilford Corner, MD ?  albuterol (PROVENTIL) (2.5 MG/3ML) 0.083% nebulizer solution 3 mL, 3 mL, Nebulization, Q4H PRN, Wilford Corner, MD ?  azithromycin (ZITHROMAX) tablet 500 mg, 500 mg, Oral, QHS, Regalado, Belkys A, MD ?  ceFEPIme (MAXIPIME) 2 g in sodium chloride 0.9 % 100 mL IVPB, 2 g, Intravenous, Q8H, Poindexter, Leann T, RPH ?  cholestyramine light (PREVALITE) packet 4 g, 4 g, Oral, PRN, Scheck, Arvella Nigh, PA-C, 4 g at 01/23/22 1455 ?  diphenhydrAMINE (BENADRYL) capsule 50 mg, 50 mg, Oral, Once **OR** diphenhydrAMINE (BENADRYL) injection 50 mg, 50 mg, Intravenous, Once, Wilford Corner, MD ?  diphenhydrAMINE (BENADRYL) injection 12.5 mg, 12.5 mg, Intravenous, Q6H PRN, Wilford Corner, MD, 12.5 mg at 01/21/22 0037 ?  enoxaparin (LOVENOX) injection 40 mg, 40 mg, Subcutaneous, Q24H, Wilford Corner, MD, 40 mg at 01/23/22 0951 ?  famotidine (PEPCID) tablet 20 mg, 20 mg, Oral, Daily, Wilford Corner, MD, 20 mg at 01/23/22 0951 ?  feeding supplement (BOOST / RESOURCE BREEZE) liquid 1 Container, 1 Container, Oral, TID BM, Regalado, Belkys A, MD, 1 Container at 01/23/22 1457 ?  fluticasone (FLONASE) 50 MCG/ACT nasal spray 2 spray, 2 spray, Each Nare, Daily PRN, Wilford Corner, MD ?  HYDROmorphone (DILAUDID) injection 0.25-0.5 mg,  0.25-0.5 mg, Intravenous, Q5 min PRN, Lynda Rainwater, MD ?  lactated ringers bolus 500 mL, 500 mL, Intravenous, Once, Regalado, Belkys A, MD ?  loperamide (IMODIUM) capsule 2 mg, 2 mg, Oral, PRN, Wilford Corner, MD, 2 mg at 01/21/22 0345 ?  metoprolol tartrate (LOPRESSOR) tablet 25 mg, 25 mg, Oral, Daily PRN, Wilford Corner, MD ?  morphine (PF) 2 MG/ML injection 2 mg, 2 mg, Intravenous, Q4H PRN, 2 mg at 01/22/22 0248 **OR** morphine (PF) 4 MG/ML injection 4 mg, 4 mg, Intravenous, Q4H PRN, Wilford Corner, MD, 4 mg at 01/18/22 2202 ?  ondansetron (ZOFRAN) tablet 4 mg, 4  mg, Oral, Q6H PRN **OR** ondansetron (ZOFRAN) injection 4 mg, 4 mg, Intravenous, Q6H PRN, Wilford Corner, MD, 4 mg at 01/21/22 7939 ?  oxyCODONE (Oxy IR/ROXICODONE) immediate release tablet 5 mg, 5 mg, Oral, Once PRN *

## 2022-01-24 ENCOUNTER — Inpatient Hospital Stay (HOSPITAL_COMMUNITY): Payer: 59

## 2022-01-24 ENCOUNTER — Other Ambulatory Visit (HOSPITAL_COMMUNITY): Payer: Self-pay

## 2022-01-24 ENCOUNTER — Telehealth: Payer: Self-pay | Admitting: Internal Medicine

## 2022-01-24 DIAGNOSIS — J90 Pleural effusion, not elsewhere classified: Secondary | ICD-10-CM

## 2022-01-24 DIAGNOSIS — R509 Fever, unspecified: Secondary | ICD-10-CM

## 2022-01-24 LAB — BASIC METABOLIC PANEL
Anion gap: 7 (ref 5–15)
BUN: <5 mg/dL — ABNORMAL LOW (ref 6–20)
CO2: 27 mmol/L (ref 22–32)
Calcium: 8.1 mg/dL — ABNORMAL LOW (ref 8.9–10.3)
Chloride: 106 mmol/L (ref 98–111)
Creatinine, Ser: 0.38 mg/dL — ABNORMAL LOW (ref 0.44–1.00)
GFR, Estimated: >60 mL/min (ref >60)
Glucose, Bld: 197 mg/dL — ABNORMAL HIGH (ref 70–99)
Potassium: 3.7 mmol/L (ref 3.5–5.1)
Sodium: 140 mmol/L (ref 135–145)

## 2022-01-24 LAB — HEPATIC FUNCTION PANEL
ALT: 26 U/L (ref 0–44)
AST: 15 U/L (ref 15–41)
Albumin: 2.4 g/dL — ABNORMAL LOW (ref 3.5–5.0)
Alkaline Phosphatase: 80 U/L (ref 38–126)
Bilirubin, Direct: 0.1 mg/dL (ref 0.0–0.2)
Indirect Bilirubin: 0.3 mg/dL (ref 0.3–0.9)
Total Bilirubin: 0.4 mg/dL (ref 0.3–1.2)
Total Protein: 5.7 g/dL — ABNORMAL LOW (ref 6.5–8.1)

## 2022-01-24 LAB — URINE CULTURE: Culture: NO GROWTH

## 2022-01-24 LAB — CBC
HCT: 34.1 % — ABNORMAL LOW (ref 36.0–46.0)
Hemoglobin: 11.2 g/dL — ABNORMAL LOW (ref 12.0–15.0)
MCH: 29.6 pg (ref 26.0–34.0)
MCHC: 32.8 g/dL (ref 30.0–36.0)
MCV: 90 fL (ref 80.0–100.0)
Platelets: 354 10*3/uL (ref 150–400)
RBC: 3.79 MIL/uL — ABNORMAL LOW (ref 3.87–5.11)
RDW: 14 % (ref 11.5–15.5)
WBC: 10 10*3/uL (ref 4.0–10.5)
nRBC: 0 % (ref 0.0–0.2)

## 2022-01-24 LAB — BRAIN NATRIURETIC PEPTIDE: B Natriuretic Peptide: 160.1 pg/mL — ABNORMAL HIGH (ref 0.0–100.0)

## 2022-01-24 LAB — LACTATE DEHYDROGENASE: LDH: 143 U/L (ref 98–192)

## 2022-01-24 MED ORDER — IOHEXOL 9 MG/ML PO SOLN
500.0000 mL | ORAL | Status: AC
  Administered 2022-01-24: 250 mL via ORAL

## 2022-01-24 MED ORDER — IOHEXOL 9 MG/ML PO SOLN
ORAL | Status: AC
  Administered 2022-01-24: 500 mL
  Filled 2022-01-24: qty 1000

## 2022-01-24 MED ORDER — SODIUM CHLORIDE 0.9 % IV SOLN
2.0000 g | Freq: Once | INTRAVENOUS | Status: AC
  Administered 2022-01-24: 2 g via INTRAVENOUS
  Filled 2022-01-24: qty 10

## 2022-01-24 MED ORDER — SODIUM CHLORIDE 0.9 % IV SOLN
2.0000 g | Freq: Three times a day (TID) | INTRAVENOUS | Status: DC
  Administered 2022-01-24: 2 g via INTRAVENOUS
  Filled 2022-01-24 (×2): qty 10

## 2022-01-24 MED ORDER — LACTATED RINGERS IV SOLN
INTRAVENOUS | Status: DC
  Filled 2022-01-24: qty 1000

## 2022-01-24 MED ORDER — SODIUM CHLORIDE 0.9 % IV SOLN
2.0000 g | INTRAVENOUS | Status: DC
  Administered 2022-01-24: 2 g via INTRAVENOUS
  Filled 2022-01-24: qty 20

## 2022-01-24 NOTE — Progress Notes (Signed)
Pharmacy Antibiotic Note ? ?Laurie Terry is a 56 y.o. female admitted on 01/15/2022 with acute pancreatitis s/p ERCP.  Patient originally started on Cefriaxone and Metronidazole (4/26-4/30), continued on Ceftriaxone and Azithromycin added on 5/1.  5/3 Ceftriaxone d/c'ed and Pharmacy has been consulted for Vancomycin and Cefepime dosing for pneumonia. 5/4 vanc and cefepime d/cd and pharmacy consulted to dose aztreonam for intra-abdominal ? ? ?Plan: ?Aztreonam 2gm IV q8h ?Follow renal function and clinical course ? ?Height: '5\' 4"'$  (162.6 cm) ?Weight: 67.6 kg (149 lb 0.5 oz) ?IBW/kg (Calculated) : 54.7 ? ?Temp (24hrs), Avg:100.1 ?F (37.8 ?C), Min:98.4 ?F (36.9 ?C), Max:102.1 ?F (38.9 ?C) ? ?Recent Labs  ?Lab 01/18/22 ?0336 01/19/22 ?1225 01/20/22 ?0330 01/21/22 ?0805 01/22/22 ?5409 01/23/22 ?0739 01/23/22 ?1323 01/23/22 ?1550 01/23/22 ?1835  ?WBC 11.9*  --  10.3 10.8* 9.8  --  9.9  --   --   ?CREATININE 0.55 0.62 0.51 0.63 0.59 0.58  --   --   --   ?LATICACIDVEN  --   --   --   --   --   --   --  2.7* 1.3  ? ?  ?Estimated Creatinine Clearance: 74.3 mL/min (by C-G formula based on SCr of 0.58 mg/dL).   ? ?Allergies  ?Allergen Reactions  ? Contrast Media [Iodinated Contrast Media] Shortness Of Breath  ?  Low blood pressure   ? Iodine Shortness Of Breath and Swelling  ? Other Hives and Swelling  ?  Seafood  ? Ampicillin Hives  ?  Childhood allergy  ? Avocado Itching  ?  Tongue and mouth  ? Levofloxacin Hives  ?  Pt already has aortic aneurysm.  ? Pantoprazole Other (See Comments)  ?  Joint aches, fever, peeling of skin  ? Septra [Bactrim] Hives  ? Sulfamethoxazole-Trimethoprim Other (See Comments) and Hives  ? Colestipol Hcl Rash  ? ? ?Antimicrobials this admission: ?4/26 Metronidazole >> 4/30 ?4/26 Ceftriaxone >> 5/3 ?5/1 Azithromycin >> 5/3 ?5/3 Cefepime >> 5/3 ?5/3 Vancomycin >> 5/3 ?5/4 aztreonam >> ? ?Dose adjustments this admission: ?  ? ?Microbiology results: ?4/30 BCx: NGTD ?4/30 UCx:    ?4/28 GI Panel PCR:  negative   ?4/28 CDiff: negative ? ?Thank you for allowing pharmacy to be a part of this patient?s care. ? ?Dolly Rias RPh ?01/24/2022, 12:27 AM ? ?

## 2022-01-24 NOTE — Plan of Care (Signed)
?  Problem: Health Behavior/Discharge Planning: ?Goal: Ability to manage health-related needs will improve ?Outcome: Progressing ?  ?Problem: Clinical Measurements: ?Goal: Ability to maintain clinical measurements within normal limits will improve ?Outcome: Progressing ?Goal: Will remain free from infection ?Outcome: Progressing ?Goal: Diagnostic test results will improve ?Outcome: Progressing ?  ?Problem: Activity: ?Goal: Risk for activity intolerance will decrease ?Outcome: Progressing ?  ?

## 2022-01-24 NOTE — Telephone Encounter (Signed)
? ? ?  rfont desk ? ?Pls give hospita lfollowup for cough and pleural effusion in 2 weeks with APP and 2 months with MR - 15 min slot ? ?THanks ?

## 2022-01-24 NOTE — Consult Note (Signed)
? ?NAME:  Laurie Terry, MRN:  [id number], DOB:  [birth year], LOS: 8 ?ADMISSION DATE:  01/15/2022, CONSULTATION DATE:  01/23/2022 ?REFERRING MD:  Dr Niel Hummer, CHIEF COMPLAINT:  Pleural effusion R > L ? ? ?History of Present Illness:  ? ?56 year old female who works in [place].  She has a past medical history of melanoma status post removal 2000, paroxysmal SVT, thoracic aortic aneurysm followed by Dr. Glenford Bayley, multiple episodes of gallstone pancreatitis status postcholecystectomy in 2017 which was then complicated biliary leak according to history. ? ?Underwent ERCP on 01/15/2022 for abdominal pain and successfully retrieving a common bile duct stone.  Discharged the same day but returned and got admitted on 01/16/2022 with epigastric abdominal pain.  CT imaging of the chest abdomen and pelvis showed mild intra and extrahepatic pneumobilia with punctate foci of gas in the hepatic hilum.  There is also infiltrative fluid adjacent to the pancreas head and near the second portion of the duodenum.  Pancreatitis was considered as etiology. ? ?Course in the hospital ?-On and off temperature of 101 Fahrenheit ongoing since 01/18/2022 with Tmax 101.9 on 01/22/2022 but relatively normal white count. ? ?-01/16/2022: Concern for pancreatitis.  Normal white count.  Still with pain.  Perforation considered less likely.  Gastrografin upper GI series -no evidence of duodenal leak or perforation.  Small gas bubbles in the common bile duct.  Lipase level normal.  Treated with fluids and antibiotics ? ?-01/18/2022: Postprandial diarrhea 7 episodes noted.?  Bile salt induced diarrhea versus antibiotic induced diarrhea.  GI pathogen panel ultimately negative for infectious causes. ? ?-01/19/2022: Continued diarrhea.  Started Colestid.  Did have some swallowing and hives difficulty after starting Colestid ? ? ?-01/20/2022: Improving pain but still with intermittent nausea but also ongoing significant diarrhea which is now the dominant  complaint.  Profuse watery diarrhea. ? ?-01/21/2022: Flexible sigmoidoscopy examined colon was normal.  3 mm polyp in the rectum biopsy done.  CT abdomen 01/21/2022 shows right greater than left pleural effusion [new since admission ? ?-01/22/2022: Abdominal pain improving.  Pain thought to be due to ERCP complication small retroperitoneal perforation but currently improved.  Diarrhea thought to be improving on antibiotics ceftriaxone and azithromycin for concern of community-acquired pneumonia.  Thought process on diarrhea change to antibiotic related versus possible microscopic colitis.   ? ?-01/23/2022: Chest x-ray with right pleural effusion.  Abdominal pain seems to be getting worse again.  Diarrhea continues.  On and off fever for several days.  White count normal.  Pulmonary consult called for pleural effusion. ? ?Past Medical History:  ? has a past medical history of Allergic rhinitis, Aortic aneurysm ([place]) (09/23/2020), Cancer [facility]), Cervical dysplasia (2007), Family history of adverse reaction to anesthesia, Intermittent palpitations, IUD, Ocular migraine, Osteomalacia, Pancreatitis, and Seafood allergy. ? ? has a past surgical history that includes Foot surgery; removal of melanoma; Intrauterine device insertion (08/07/2017); Cholecystectomy (N/A, 04/09/2016); ERCP (04/11/2016); ERCP (N/A, 04/12/2016); Bunionectomy (2018); ERCP (N/A, 01/15/2022); sphincterotomy (01/15/2022); removal of stones (01/15/2022); Flexible sigmoidoscopy (N/A, 01/21/2022); and biopsy (01/21/2022). ? ?Significant Hospital Events:  ?01/15/2022 - admit ? ? ?Interim History / Subjective:  ? ?01/24/2022 -Rt thora 300cc Pl LDH  150 / SR LDH 143 - ratio > 1. C/w Exudate.  Had abx change yesterday -> then allergic rxn. Now fever broke and also RUQ pain less.Overall feeling better.  Had CTAB - Bilateral R > L effusion + with atlectasis - R effusion better. C/o very mild cough since admit correlating with all this. STill overall  much better. ECHO  nromal ? ?Objective   ?Blood pressure 110/77, pulse 63, temperature 98.2 ?F (36.8 ?C), temperature source Oral, resp. rate 14, height '5\' 4"'$  (1.626 m), weight 67.6 kg, SpO2 97 %. ?   ?   ? ?Intake/Output Summary (Last 24 hours) at 01/24/2022 1725 ?Last data filed at 01/24/2022 1500 ?Gross per 24 hour  ?Intake 2678.03 ml  ?Output 1 ml  ?Net 2677.03 ml  ? ?Filed Weights  ? 01/15/22 1949 01/21/22 1324  ?Weight: 67.6 kg 67.6 kg  ? ? ?Examination: ?General: Pleaant ?HENT: No neck nodes. No elevated JVP.  ?Lungs: not examined ?Cardiovascular: Not examined ?Abdomen: Soft ?Extremities: Intact, No cyanosis. No clubbing no demea ?Neuro: AxOX3, Speech normal. Moves all 4s ?GU: not examined ?Psych:  more cheerful ? ?Resolved Hospital Problem list   ?x ? ?Assessment & Plan:  ?ASSESSMENT / PLAN: ? ?PULMONARY  ?A:  ?Bilateral new R > L effusion - was not present 01/16/22 but appeared on CT 5/1/123 ? - Mild exudat  - likely c/w Abdominal process and therefor reactive ? ?P:   ?Await cytology esults from right throa ? ? ? ?GASTROINTESTINAL ?A:   ?Post ERCP abd pain and diarrhea ? ?01/24/2022 -still + but better ? ?P:   ?Per GI and triad ? ?HEMATOLOGIC   - HEME ?A:  ?Anemia of illness ? ? ?P:  ?- PRBC for hgb </= 6.9gm%   ? - exceptions are ?  -  if ACS susepcted/confirmed then transfuse for hgb </= 8.0gm%,  or  ?  -  active bleeding with hemodynamic instability, then transfuse regardless of hemoglobin value ?  At at all times try to transfuse 1 unit prbc as possible with exception of active hemorrhage ? ? ?SIRS ? ?5/3 - fever could be due to abd process. Not seeing evidence of cleat cut pneumonia. On 5/4 improed and ID following ? ?Plan ? - per ID ? ? ? ?FOllowup  ?CCM  will sign off ?Will arrange followup in Pulm clinic for cough and reactive effusions ? ? ? ? ?SIGNATURE  ? ? ?Dr. Brand Males, M.D., F.C.C.P,  ?Pulmonary and Critical Care Medicine ?Staff Physician, [place] ?Center Director - Interstitial Lung Disease  Program   ?[facility] ?[place], [place], [address] ? ?NPI Number:  NPI #[id number] ? ?Pager: [phone], If no answer  -> Check AMION or Try [phone] ?Telephone (clinical office): [phone] ?Telephone (research): [phone] ? ?5:25 PM ?01/24/2022 ? ? ?01/24/2022 ?5:25 PM ? ? ? ?LABS  ? ? ?PULMONARY ?No results for input(s): PHART, PCO2ART, PO2ART, HCO3, TCO2, O2SAT in the last 168 hours. ? ?Invalid input(s): PCO2, PO2 ? ?CBC ?Recent Labs  ?Lab 01/22/22 ?6237 01/23/22 ?1323 01/24/22 ?0405  ?HGB 10.6* 11.3* 11.2*  ?HCT 31.9* 35.4* 34.1*  ?WBC 9.8 9.9 10.0  ?PLT 261 365 354  ? ? ?COAGULATION ?No results for input(s): INR in the last 168 hours. ? ?CARDIAC ? No results for input(s): TROPONINI in the last 168 hours. ?No results for input(s): PROBNP in the last 168 hours. ? ?CHEMISTRY ?Recent Labs  ?Lab 01/20/22 ?0330 01/21/22 ?0322 01/21/22 ?0805 01/22/22 ?6283 01/23/22 ?1517 01/24/22 ?0405  ?NA 139  --  137 135 138 140  ?K 3.1*  --  3.9 3.6 3.3* 3.7  ?CL 109  --  105 104 103 106  ?CO2 26  --  24 18* 27 27  ?GLUCOSE 83  --  73 68* 105* 197*  ?BUN 6  --  7 <5* <5* <5*  ?CREATININE 0.51  --  0.63 0.59 0.58 0.38*  ?CALCIUM 7.6*  --  7.6* 7.7* 7.8* 8.1*  ?MG  --  1.9  --   --   --   --   ? ?Estimated Creatinine Clearance: 74.3 mL/min (A) (by C-G formula based on SCr of 0.38 mg/dL (L)). ? ? ?LIVER ?Recent Labs  ?Lab 01/18/22 ?0336 01/19/22 ?1225 01/24/22 ?0405  ?AST 11* 17 15  ?ALT '11 15 26  '$ ?ALKPHOS 41 56 80  ?BILITOT 0.9 0.6 0.4  ?PROT 5.4* 5.9* 5.7*  ?ALBUMIN 2.8* 3.0* 2.4*  ? ? ? ?INFECTIOUS ?Recent Labs  ?Lab 01/21/22 ?0924 01/23/22 ?1323 01/23/22 ?1550 01/23/22 ?1835  ?LATICACIDVEN  --   --  2.7* 1.3  ?PROCALCITON 0.43 <0.10  --   --   ? ? ? ?ENDOCRINE ?CBG (last 3)  ?Recent Labs  ?  01/23/22 ?0808  ?GLUCAP 101*  ? ? ? ? ? ? ? ?IMAGING x48h  - image(s) personally visualized  -   highlighted in bold ?CT ABDOMEN PELVIS WO CONTRAST ? ?Result Date: 01/24/2022 ?CLINICAL  DATA:  Right-sided retroperitoneal inflammatory process after ERCP with persistent fever. Status post right thoracentesis yesterday. Evaluation of inflammatory process for drainable abscess and repeat CT with oral con

## 2022-01-24 NOTE — Progress Notes (Signed)
[facility] Progress Note ? ?Laurie Terry 56 y.o. 05-27-1966 ? ? ?Subjective: ?Seen this morning prior to CT. Cough overnight. Loose stools continue. ? ?Objective: ?Vital signs: ?Vitals:  ? 01/24/22 0348 01/24/22 0948  ?BP: 93/60 100/70  ?Pulse: 68 (!) 56  ?Resp: 18 18  ?Temp: 97.8 ?F (36.6 ?C) 97.8 ?F (36.6 ?C)  ?SpO2:  98%  ? ? ?Physical Exam: ?Gen: alert, no acute distress ?HEENT: anicteric sclera ?CV: RRR ?Chest: CTA B ?Abd: epigastric tenderness without guarding, soft, nondistended, +BS ?Ext: no edema ? ?Lab Results: ?Recent Labs  ?  01/23/22 ?4496 01/24/22 ?0405  ?NA 138 140  ?K 3.3* 3.7  ?CL 103 106  ?CO2 27 27  ?GLUCOSE 105* 197*  ?BUN <5* <5*  ?CREATININE 0.58 0.38*  ?CALCIUM 7.8* 8.1*  ? ?Recent Labs  ?  01/24/22 ?0405  ?AST 15  ?ALT 26  ?ALKPHOS 80  ?BILITOT 0.4  ?PROT 5.7*  ?ALBUMIN 2.4*  ? ?Recent Labs  ?  01/22/22 ?7591 01/23/22 ?1323 01/24/22 ?0405  ?WBC 9.8 9.9 10.0  ?NEUTROABS 6.9  --   --   ?HGB 10.6* 11.3* 11.2*  ?HCT 31.9* 35.4* 34.1*  ?MCV 89.9 91.7 90.0  ?PLT 261 365 354  ? ? ? ? ?Assessment/Plan: ?Cough and SOB with pre-medication for CT so changed to oral contrast only. Await CT findings. Continue IV Abx. Cholestyramine. Supportive care. Diet to be determined following CT. Outpt gastric biopsies showed chronic gastritis without H. Pylori. ? ? ?Laurie Terry ?01/24/2022, 12:15 PM ? ?Questions please call [phone] Patient ID: Laurie Terry, female   DOB: May 18, 1966, 56 y.o.   MRN: [id number] ? ?

## 2022-01-24 NOTE — Consult Note (Signed)
? ? ? ?[facility]   ? ?Date of Admission:  01/15/2022    ? ?Total days of antibiotics 10 ?        ?      ?Reason for Consult: Fever   ?Referring Provider: Dr. Tyrell Antonio ?Primary Care Provider: [name], MD ? ? ?ASSESSMENT: ? ?Laurie Terry is a 56 y/o female admitted with worsening abdominal pain following ERCP with concern for pancreatitis verus retroperitoneal perforation with course complicated by non-infectious diarrhea and intermittent fevers. Flex sig was unrevealing. Pan cultures were obtained blood, pleural fluid and urine are pending. Reported reaction to cefepime and/or vancomycin with multiple allergies/intolerances. No evidence of pneumonia and with 10 days of Ceftriaxone would expect at worst this would be treated. Fever curve and abdominal pain improving. CT scan with no obvious evidence of infection . Pathology smear from flex sig remains pending. Change antibiotics to ceftriaxone and metronidazole to cover for intraabdominal infection.  Plan discussed and will continue to monitor at this point. ? ?PLAN: ? ?Change antibiotics to ceftriaxone and metronidazole.  ?Monitor fever curve and abdominal pain. ?Monitor blood cultures for bacteremia.  ?Remaining medical and supportive care per primary team.  ? ?Principal Problem: ?  Post-ERCP acute pancreatitis ?Active Problems: ?  Paroxysmal SVT (supraventricular tachycardia) (HCC) ?  GERD without esophagitis ?  Choledocholithiasis ?  Acute pancreatitis ? ? ? enoxaparin (LOVENOX) injection  40 mg Subcutaneous Q24H  ? famotidine  20 mg Oral Daily  ? feeding supplement  1 Container Oral TID BM  ? ? ? ?HPI: Laurie Terry is a 56 y.o. female with previous medical history of pancreatitis, cholecystectomy, and thoracic aortic aneurysm admitted with acute onset abdominal pain following being discharged earlier in the day s/p ERCP.  ? ?Laurie Terry recently underwent ERCP with Dr. Watt Climes on 4/25 with removal of common bile duct  stone. Subsequently developed epigastric pain later that day. CT chest/abdomen/pelvis with infiltrative fluid adjacent to the uncinate process and head of the pancreas with differentials of postprocedural fluid/change or acute pancreatitis. Lipase level was 40 on admission. Started on Rocephin and Flagyl. Gastrografin upper GI series was negative for leak. Developed diarrhea with GI panel and C. Difficle being negative. Antibiotics were discontinued on 01/20/22 as a result of diarrhea and flex sig which was mostly unrevealing and diarrhea possibly related to antibiotics. Diarrhea improved with cholestyramine. Dr. Watt Climes believes this may be related to small retroperitoneal perforation and not ERCP pancreatitis.  ? ?Laurie Terry has been intermittently febrile since admission with most recent fever of 102.1 F in the last 24 hours with continued worsening epigastric pain. Chest x-ray with bilateral pleural effusions worse on the right s/p thoracentesis on 5/3 with removal of 300 cc of yellow pleural fluid. No organisms seen and cultures pending. New blood cultures drawn on 5/3 with previous set on 01/20/22 finalized negative. Started on Ceftriaxone and Azithromycin with concern for pneumonia and was changed to Vancomycin and Cefepime. Developed acute onset cough and some shortness of breath follow administration of Cefepime. Continue on vancomycin and cefepime changed back to ceftriaxone with addition of metronidazole.  ? ?Laurie Terry is currently on Day 10 of antimicrobial therapy with continued intermittent fevers. Cultures remain in process and she continues to have abdominal pain and diarrhea. Antibiotics have been changed Aztreonam and Flagyl. New imaging with no fluid collections or sources of infection/leaks.  ? ?Review of Systems: ?Review of Systems  ?Constitutional:  Negative for chills, fever and weight loss.  ?  Respiratory:  Negative for cough, shortness of breath and wheezing.   ?Cardiovascular:  Negative  for chest pain and leg swelling.  ?Gastrointestinal:  Positive for abdominal pain. Negative for constipation, diarrhea, nausea and vomiting.  ?Skin:  Negative for rash.  ? ? ?Past Medical History:  ?Diagnosis Date  ? Allergic rhinitis   ? Aortic aneurysm ([place]) 09/23/2020  ? [facility])   ? Melanoma  ? Cervical dysplasia 2007  ? low-grade SIL, normal Pap smears since then  ? Family history of adverse reaction to anesthesia   ? mom has h/o post op nausea/vomiting  ? Intermittent palpitations   ? IUD   ? inserted 03/2002.04/06/2007, 06/03/2012  ? Ocular migraine   ? Osteomalacia   ? Pancreatitis   ? Seafood allergy   ? ? ?Social History  ? ?Tobacco Use  ? Smoking status: Former  ? Smokeless tobacco: Never  ?Vaping Use  ? Vaping Use: Never used  ?Substance Use Topics  ? Alcohol use: Not Currently  ?  Comment: Rare  ? Drug use: No  ? ? ?Family History  ?Problem Relation Age of Onset  ? Tremor Mother   ? Stroke Father   ? Autoimmune disease Father   ? CVA Father   ? Breast cancer Sister 31  ? Arthritis Sister   ?     Psoriatic  ? Thyroid disease Sister   ? Breast cancer Maternal Grandmother   ?     Age 94  ? Thyroid disease Daughter   ?     Hypo  ? ? ?Allergies  ?Allergen Reactions  ? Cefepime Cough  ?  Patient develops cough, rash in her ears.   ? Contrast Media [Iodinated Contrast Media] Shortness Of Breath  ?  Low blood pressure   ? Iodine Shortness Of Breath and Swelling  ? Other Hives and Swelling  ?  Seafood  ? Ampicillin Hives  ?  Childhood allergy  ? Avocado Itching  ?  Tongue and mouth  ? Levofloxacin Hives  ?  Pt already has aortic aneurysm.  ? Pantoprazole Other (See Comments)  ?  Joint aches, fever, peeling of skin  ? Septra [Bactrim] Hives  ? Sulfamethoxazole-Trimethoprim Other (See Comments) and Hives  ? Vancomycin Cough  ?  Patient develops cough, rash ears, tingling lips after receiving Cefepime and vancomycin 01/23/2022  ? Colestipol Hcl Rash  ? ? ?OBJECTIVE: ?Blood pressure 100/70, pulse (!) 56, temperature  97.8 ?F (36.6 ?C), temperature source Oral, resp. rate 18, height '5\' 4"'$  (1.626 m), weight 67.6 kg, SpO2 98 %. ? ?Physical Exam ?Constitutional:   ?   General: She is not in acute distress. ?   Appearance: She is well-developed.  ?Cardiovascular:  ?   Rate and Rhythm: Regular rhythm. Bradycardia present.  ?   Heart sounds: Normal heart sounds.  ?Pulmonary:  ?   Effort: Pulmonary effort is normal.  ?   Breath sounds: Normal breath sounds.  ?Abdominal:  ?   General: Bowel sounds are normal.  ?Skin: ?   General: Skin is warm and dry.  ?Neurological:  ?   Mental Status: She is alert and oriented to person, place, and time.  ?Psychiatric:     ?   Behavior: Behavior normal.     ?   Thought Content: Thought content normal.     ?   Judgment: Judgment normal.  ? ? ?Lab Results ?Lab Results  ?Component Value Date  ? WBC 10.0 01/24/2022  ? HGB 11.2 (L)  01/24/2022  ? HCT 34.1 (L) 01/24/2022  ? MCV 90.0 01/24/2022  ? PLT 354 01/24/2022  ?  ?Lab Results  ?Component Value Date  ? CREATININE 0.38 (L) 01/24/2022  ? BUN <5 (L) 01/24/2022  ? NA 140 01/24/2022  ? K 3.7 01/24/2022  ? CL 106 01/24/2022  ? CO2 27 01/24/2022  ?  ?Lab Results  ?Component Value Date  ? ALT 26 01/24/2022  ? AST 15 01/24/2022  ? ALKPHOS 80 01/24/2022  ? BILITOT 0.4 01/24/2022  ?  ? ?Microbiology: ?Recent Results (from the past 240 hour(s))  ?Gastrointestinal Panel by PCR , Stool     Status: None  ? Collection Time: 01/18/22  1:28 PM  ? Specimen: Stool  ?Result Value Ref Range Status  ? Campylobacter species NOT DETECTED NOT DETECTED Final  ? Plesimonas shigelloides NOT DETECTED NOT DETECTED Final  ? Salmonella species NOT DETECTED NOT DETECTED Final  ? Yersinia enterocolitica NOT DETECTED NOT DETECTED Final  ? Vibrio species NOT DETECTED NOT DETECTED Final  ? Vibrio cholerae NOT DETECTED NOT DETECTED Final  ? Enteroaggregative E coli (EAEC) NOT DETECTED NOT DETECTED Final  ? Enteropathogenic E coli (EPEC) NOT DETECTED NOT DETECTED Final  ? Enterotoxigenic E  coli (ETEC) NOT DETECTED NOT DETECTED Final  ? Shiga like toxin producing E coli (STEC) NOT DETECTED NOT DETECTED Final  ? Shigella/Enteroinvasive E coli (EIEC) NOT DETECTED NOT DETECTED Final  ? Cryptospo

## 2022-01-24 NOTE — Progress Notes (Signed)
?PROGRESS NOTE ? ? ? ?Laurie Terry  VOJ:[id number] DOB: [birth year] DOA: 01/15/2022 ?PCP: [name], MD  ? ?Brief Narrative: ?Very pleasant 56 year old with past medical history significant for melanoma, paroxysmal SVT, thoracic aortic aneurysm and other comorbidities presented to the ED complaining of abdominal pain after having outpatient ERCP. Patient was admitted for further evaluation, initially she was thought to have acute pancreatitis post ERCP.  Upper GI series and was negative for biliary leak or any duodenal leak. Per Dr. Watt Climes, Patient might had small retroperitoneal perforation. GI continue to follow.  ? ?Hospital course complicated by fever thought to be related to possible pneumonia, Bilateral Pleural effusion, worse on the right, Diarrhea of unclear etiology. Possible allergic reaction to Colestid and Cefepime.   ? ? ?Assessment & Plan: ?  ?Principal Problem: ?  Post-ERCP acute pancreatitis ?Active Problems: ?  Choledocholithiasis ?  Paroxysmal SVT (supraventricular tachycardia) (HCC) ?  GERD without esophagitis ?  Acute pancreatitis ? ?1-Post ERCP Complication; ? Pancreatitis vs Small retroperitoneal perforation.  ?CT scan abdomen pelvis 5/01; Increasing loculated gas and fluid in the gallbladder fossa. New right retroperitoneal fluid inferior to the liver and lateral to kidney.  ?GI following. Recommending repeating CT scan.  ?-Lipase 32. ? ?2-Fever:  ?-Less likely related to PNA.  ?-UA negative. Blood Culture no growth to date. Pleural fluid no growth to date.  ?-Concern for intra-abdominal process.  ?-Plan to repeat CT abdomen pelvis with only oral contrast, No IV contrast due to episode yesterday of cough, rash ears, tingling sensation lips. Discussed with Dr Kathlene Cote we should hold doing CT with IV contrast in case she had histamine reaction yesterday. Ok to do CT with oral contrast, risk is low. CT with oral contrast might help determine duodenal leak and evaluate inflammation  retroperitoneal area.  ?-Currently on IV flagyl and Aztreonam.  ?-Concern  for allergic reaction to Cefepime. Cefepime and vancomycin was discontinue. I will add both Vanc and cefepime to allergy list  ?-ID consulted.  ? ?Possible Allergic reaction; to Cefepime. ?She also received Vancomycin yesterday.  ?She develops coughing spell, rash in her ears, lips tingling yesterday night. Her symptoms improved last night. She received prednisone, benadryl, Albuterol. There was not any evidence of angioedema.  ?This morning she is feeling better, no rash, no tingling. She started to have again some cough when I saw her. Lungs sound clear. She will received another dose of prednisone, benadryl. Albuterol order PRN.  ?-Will proceed with repeat Chest x ray today.  ? ?BL pleural effusion worse on the right:  ?PNA was rule out/  ?-Chest x ray 4/30: New increasing effusion bilaterally, right greater than left, with underline atelectatic changes.  ?-Received  IV ceftriaxone and Azithromycin for  ?-Pulmonary consulted for further evaluation of pleural effusion.  ?-Dr Bobbe Medico recommended thoracentesis and ECHO. ECHO normal Ef, mild Diastolic dysfunction.  ?-Thoracentesis yield 300 cc fluid.  ?-Dr Chase Caller think effusion is likely reactive from abdominal process.  ?-Pleural fluid culture; no growth to date.  ? ?-Diarrhea: ?Unclear etiology.  ?C diff and GI pathogen negative.  ?S/P Flexible sigmoidoscopy 5/01: Normal mucosa. Small polyp removed. Biopsy negative for inflammatory bowel diseases, microscopic Colitis and collagenous colitis.  ?Received one dose of  Cholestyramine 5/03.  ?Report no BM yesterday after eating lunch. She had one small episode of watery with small consistency stool.  ?Overall improved.  ? ?Hypokalemia; Replaced.  ? ?Paroxysmal SVT; continue with metoprolol.  ? ? ?Estimated body mass index is 25.58 kg/m? as calculated  from the following: ?  Height as of this encounter: '5\' 4"'$  (1.626 m). ?  Weight as of this  encounter: 67.6 kg. ? ? ?DVT prophylaxis: Lovenox ?Code Status: Full code ?Family Communication: care discussed with patient.  ?Disposition Plan:  ?Status is: Inpatient ?Remains inpatient appropriate because: remain in the hospital for management of Fever, pleural effusion.  ? ? ? ?Consultants:  ?CCM ?GI ? ?Procedures:  ? ? ?Antimicrobials:  ? ? ?Subjective: ?Still having abdominal pain. Worse after she eats. She doesn't know how is the pain today, she hasn't been able to eat. She report no BM today. Had small BM yesterday after drinking boost.  ?She was told when she had ERCP/EUS she had some inflammation in her stomach, biopsy was taken. She wonder about results, and if that is causing the pain. Will let Dr Michail Sermon know.  ? ? ? ?Objective: ?Vitals:  ? 01/23/22 2112 01/23/22 2127 01/24/22 0015 01/24/22 0348  ?BP:  103/63  93/60  ?Pulse:  (!) 107  68  ?Resp:  18  18  ?Temp: (!) 100.9 ?F (38.3 ?C) 100.1 ?F (37.8 ?C) 98.3 ?F (36.8 ?C) 97.8 ?F (36.6 ?C)  ?TempSrc: Oral Oral Oral Oral  ?SpO2:  92%    ?Weight:      ?Height:      ? ? ?Intake/Output Summary (Last 24 hours) at 01/24/2022 0841 ?Last data filed at 01/24/2022 0428 ?Gross per 24 hour  ?Intake 2118.61 ml  ?Output 1 ml  ?Net 2117.61 ml  ? ? ?Filed Weights  ? 01/15/22 1949 01/21/22 1324  ?Weight: 67.6 kg 67.6 kg  ? ? ?Examination: ? ?General exam: NAD ?Respiratory system: CTA, no wheezing, no strider.  ?Cardiovascular system: S 1, S 2 RRR ?Gastrointestinal system: BS present, soft, mild tender.  ?Central nervous system: alert, oriented.  ? ? ? ?Data Reviewed: I have personally reviewed following labs and imaging studies ? ?CBC: ?Recent Labs  ?Lab 01/20/22 ?0330 01/21/22 ?0805 01/22/22 ?9323 01/23/22 ?1323 01/24/22 ?0405  ?WBC 10.3 10.8* 9.8 9.9 10.0  ?NEUTROABS 8.4* 8.3* 6.9  --   --   ?HGB 10.2* 10.8* 10.6* 11.3* 11.2*  ?HCT 30.8* 33.0* 31.9* 35.4* 34.1*  ?MCV 89.5 89.9 89.9 91.7 90.0  ?PLT 202 259 261 365 354  ? ? ?Basic Metabolic Panel: ?Recent Labs  ?Lab  01/20/22 ?0330 01/21/22 ?0322 01/21/22 ?0805 01/22/22 ?5573 01/23/22 ?2202 01/24/22 ?0405  ?NA 139  --  137 135 138 140  ?K 3.1*  --  3.9 3.6 3.3* 3.7  ?CL 109  --  105 104 103 106  ?CO2 26  --  24 18* 27 27  ?GLUCOSE 83  --  73 68* 105* 197*  ?BUN 6  --  7 <5* <5* <5*  ?CREATININE 0.51  --  0.63 0.59 0.58 0.38*  ?CALCIUM 7.6*  --  7.6* 7.7* 7.8* 8.1*  ?MG  --  1.9  --   --   --   --   ? ? ?GFR: ?Estimated Creatinine Clearance: 74.3 mL/min (A) (by C-G formula based on SCr of 0.38 mg/dL (L)). ?Liver Function Tests: ?Recent Labs  ?Lab 01/18/22 ?0336 01/19/22 ?1225 01/24/22 ?0405  ?AST 11* 17 15  ?ALT '11 15 26  '$ ?ALKPHOS 41 56 80  ?BILITOT 0.9 0.6 0.4  ?PROT 5.4* 5.9* 5.7*  ?ALBUMIN 2.8* 3.0* 2.4*  ? ? ?Recent Labs  ?Lab 01/23/22 ?1323  ?LIPASE 32  ?AMYLASE 56  ? ?No results for input(s): AMMONIA in the last 168 hours. ?Coagulation Profile: ?  No results for input(s): INR, PROTIME in the last 168 hours. ?Cardiac Enzymes: ?No results for input(s): CKTOTAL, CKMB, CKMBINDEX, TROPONINI in the last 168 hours. ?BNP (last 3 results) ?No results for input(s): PROBNP in the last 8760 hours. ?HbA1C: ?No results for input(s): HGBA1C in the last 72 hours. ?CBG: ?Recent Labs  ?Lab 01/23/22 ?0808  ?GLUCAP 101*  ? ? ?Lipid Profile: ?No results for input(s): CHOL, HDL, LDLCALC, TRIG, CHOLHDL, LDLDIRECT in the last 72 hours. ?Thyroid Function Tests: ?No results for input(s): TSH, T4TOTAL, FREET4, T3FREE, THYROIDAB in the last 72 hours. ?Anemia Panel: ?No results for input(s): VITAMINB12, FOLATE, FERRITIN, TIBC, IRON, RETICCTPCT in the last 72 hours. ?Sepsis Labs: ?Recent Labs  ?Lab 01/21/22 ?0924 01/23/22 ?1323 01/23/22 ?1550 01/23/22 ?1835  ?PROCALCITON 0.43 <0.10  --   --   ?LATICACIDVEN  --   --  2.7* 1.3  ? ? ? ?Recent Results (from the past 240 hour(s))  ?Gastrointestinal Panel by PCR , Stool     Status: None  ? Collection Time: 01/18/22  1:28 PM  ? Specimen: Stool  ?Result Value Ref Range Status  ? Campylobacter species NOT DETECTED  NOT DETECTED Final  ? Plesimonas shigelloides NOT DETECTED NOT DETECTED Final  ? Salmonella species NOT DETECTED NOT DETECTED Final  ? Yersinia enterocolitica NOT DETECTED NOT DETECTED Final  ? Vibrio species NOT DE

## 2022-01-25 LAB — CBC
HCT: 29.8 % — ABNORMAL LOW (ref 36.0–46.0)
Hemoglobin: 9.7 g/dL — ABNORMAL LOW (ref 12.0–15.0)
MCH: 29 pg (ref 26.0–34.0)
MCHC: 32.6 g/dL (ref 30.0–36.0)
MCV: 89.2 fL (ref 80.0–100.0)
Platelets: 369 10*3/uL (ref 150–400)
RBC: 3.34 MIL/uL — ABNORMAL LOW (ref 3.87–5.11)
RDW: 14.2 % (ref 11.5–15.5)
WBC: 11.4 10*3/uL — ABNORMAL HIGH (ref 4.0–10.5)
nRBC: 0 % (ref 0.0–0.2)

## 2022-01-25 LAB — ACID FAST SMEAR (AFB, MYCOBACTERIA): Acid Fast Smear: NEGATIVE

## 2022-01-25 LAB — BASIC METABOLIC PANEL
Anion gap: 10 (ref 5–15)
BUN: 10 mg/dL (ref 6–20)
CO2: 28 mmol/L (ref 22–32)
Calcium: 8.2 mg/dL — ABNORMAL LOW (ref 8.9–10.3)
Chloride: 105 mmol/L (ref 98–111)
Creatinine, Ser: 0.44 mg/dL (ref 0.44–1.00)
GFR, Estimated: >60 mL/min (ref >60)
Glucose, Bld: 98 mg/dL (ref 70–99)
Potassium: 3.1 mmol/L — ABNORMAL LOW (ref 3.5–5.1)
Sodium: 143 mmol/L (ref 135–145)

## 2022-01-25 LAB — PATHOLOGIST SMEAR REVIEW

## 2022-01-25 MED ORDER — CHOLESTYRAMINE LIGHT 4 G PO PACK
4.0000 g | PACK | ORAL | Status: DC | PRN
  Administered 2022-01-25 (×2): 4 g via ORAL
  Filled 2022-01-25 (×3): qty 1

## 2022-01-25 MED ORDER — AMOXICILLIN-POT CLAVULANATE 875-125 MG PO TABS
1.0000 | ORAL_TABLET | Freq: Two times a day (BID) | ORAL | Status: DC
  Administered 2022-01-25 – 2022-01-26 (×3): 1 via ORAL
  Filled 2022-01-25 (×3): qty 1

## 2022-01-25 MED ORDER — CHOLESTYRAMINE 4 G PO PACK: 4.0000 g | PACK | ORAL | Status: DC | PRN

## 2022-01-25 MED ORDER — CHOLESTYRAMINE LIGHT 4 G PO PACK
4.0000 g | PACK | Freq: Two times a day (BID) | ORAL | Status: DC
  Administered 2022-01-26: 4 g via ORAL
  Filled 2022-01-25 (×2): qty 1

## 2022-01-25 MED ORDER — POTASSIUM CHLORIDE 20 MEQ PO PACK
40.0000 meq | PACK | Freq: Two times a day (BID) | ORAL | Status: DC
  Administered 2022-01-25: 40 meq via ORAL
  Filled 2022-01-25 (×2): qty 2

## 2022-01-25 MED ORDER — POTASSIUM CHLORIDE CRYS ER 20 MEQ PO TBCR
40.0000 meq | EXTENDED_RELEASE_TABLET | Freq: Once | ORAL | Status: AC
  Administered 2022-01-25: 40 meq via ORAL
  Filled 2022-01-25: qty 2

## 2022-01-25 NOTE — Progress Notes (Signed)
? ? ?[facility] ? ?Date of Admission:  01/15/2022    ? ?Total days of antibiotics 11 ?        ?ASSESSMENT: ? ?Ms. Thor continues to improve and has been afebrile now for the past 24 hours with improved abdominal pain and diarrhea. Appears source control has been achieved since the pleural effusion was drained although suspect underlying abdominal infection. Will change antibiotics to Augmentin which she can take for 7 days to complete treatment. Will give trial dose to ensure she tolerates it. Continue with follow up with PCP and Gastroenterology following discharge. Remaining medical and supportive care per primary teams.  ? ?PLAN: ? ?Change antibiotics to Augmentin.  ?Continue supportive care as needed.  ?Follow up with Gastroenterology and PCP following discharge.  ?Rooks for discharge from [facility] stand point.  ? ?ID will sign off.  ? ?Principal Problem: ?  Post-ERCP acute pancreatitis ?Active Problems: ?  Paroxysmal SVT (supraventricular tachycardia) (HCC) ?  GERD without esophagitis ?  Choledocholithiasis ?  Acute pancreatitis ?  Bilateral pleural effusion ? ? ? amoxicillin-clavulanate  1 tablet Oral Q12H  ? enoxaparin (LOVENOX) injection  40 mg Subcutaneous Q24H  ? famotidine  20 mg Oral Daily  ? feeding supplement  1 Container Oral TID BM  ? potassium chloride  40 mEq Oral BID  ? ? ?SUBJECTIVE: ? ?Afebrile overnight with no acute events. Diarrhea improving. Abdominal pain mild. Feeling better overall.  ? ?Allergies  ?Allergen Reactions  ? Cefepime Cough  ?  Patient develops cough, rash in her ears.   ? Contrast Media [Iodinated Contrast Media] Shortness Of Breath  ?  Low blood pressure   ? Iodine Shortness Of Breath and Swelling  ? Other Hives and Swelling  ?  Seafood  ? Ampicillin Hives  ?  Childhood allergy  ? Avocado Itching  ?  Tongue and mouth  ? Levofloxacin Hives  ?  Pt already has aortic aneurysm.  ? Pantoprazole Other (See Comments)  ?  Joint aches, fever, peeling of skin  ?  Septra [Bactrim] Hives  ? Sulfamethoxazole-Trimethoprim Other (See Comments) and Hives  ? Vancomycin Cough  ?  Patient develops cough, rash ears, tingling lips after receiving Cefepime and vancomycin 01/23/2022  ? Colestipol Hcl Rash  ? ? ? ?Review of Systems: ?Review of Systems  ?Constitutional:  Negative for chills, fever and weight loss.  ?Respiratory:  Negative for cough, shortness of breath and wheezing.   ?Cardiovascular:  Negative for chest pain and leg swelling.  ?Gastrointestinal:  Negative for abdominal pain, constipation, diarrhea, nausea and vomiting.  ?Skin:  Negative for rash.  ? ? ? ?OBJECTIVE: ?Vitals:  ? 01/24/22 1249 01/24/22 2046 01/24/22 2255 01/25/22 0533  ?BP: 110/77 (!) 106/56  (!) 101/53  ?Pulse: 63 81  65  ?Resp: '14 18  19  '$ ?Temp: 98.2 ?F (36.8 ?C) 98.8 ?F (37.1 ?C) 98.3 ?F (36.8 ?C) 98.5 ?F (36.9 ?C)  ?TempSrc: Oral Oral Oral Oral  ?SpO2: 97% 98%  94%  ?Weight:      ?Height:      ? ?Body mass index is 25.58 kg/m?. ? ?Physical Exam ?Constitutional:   ?   General: She is not in acute distress. ?   Appearance: She is well-developed.  ?Cardiovascular:  ?   Rate and Rhythm: Normal rate and regular rhythm.  ?   Heart sounds: Normal heart sounds.  ?Pulmonary:  ?   Effort: Pulmonary effort is normal.  ?   Breath sounds: Normal  breath sounds.  ?Skin: ?   General: Skin is warm and dry.  ?Neurological:  ?   Mental Status: She is alert and oriented to person, place, and time.  ?Psychiatric:     ?   Behavior: Behavior normal.     ?   Thought Content: Thought content normal.     ?   Judgment: Judgment normal.  ? ? ?Lab Results ?Lab Results  ?Component Value Date  ? WBC 11.4 (H) 01/25/2022  ? HGB 9.7 (L) 01/25/2022  ? HCT 29.8 (L) 01/25/2022  ? MCV 89.2 01/25/2022  ? PLT 369 01/25/2022  ?  ?Lab Results  ?Component Value Date  ? CREATININE 0.44 01/25/2022  ? BUN 10 01/25/2022  ? NA 143 01/25/2022  ? K 3.1 (L) 01/25/2022  ? CL 105 01/25/2022  ? CO2 28 01/25/2022  ?  ?Lab Results  ?Component Value Date  ? ALT  26 01/24/2022  ? AST 15 01/24/2022  ? ALKPHOS 80 01/24/2022  ? BILITOT 0.4 01/24/2022  ?  ? ?Microbiology: ?Recent Results (from the past 240 hour(s))  ?Gastrointestinal Panel by PCR , Stool     Status: None  ? Collection Time: 01/18/22  1:28 PM  ? Specimen: Stool  ?Result Value Ref Range Status  ? Campylobacter species NOT DETECTED NOT DETECTED Final  ? Plesimonas shigelloides NOT DETECTED NOT DETECTED Final  ? Salmonella species NOT DETECTED NOT DETECTED Final  ? Yersinia enterocolitica NOT DETECTED NOT DETECTED Final  ? Vibrio species NOT DETECTED NOT DETECTED Final  ? Vibrio cholerae NOT DETECTED NOT DETECTED Final  ? Enteroaggregative E coli (EAEC) NOT DETECTED NOT DETECTED Final  ? Enteropathogenic E coli (EPEC) NOT DETECTED NOT DETECTED Final  ? Enterotoxigenic E coli (ETEC) NOT DETECTED NOT DETECTED Final  ? Shiga like toxin producing E coli (STEC) NOT DETECTED NOT DETECTED Final  ? Shigella/Enteroinvasive E coli (EIEC) NOT DETECTED NOT DETECTED Final  ? Cryptosporidium NOT DETECTED NOT DETECTED Final  ? Cyclospora cayetanensis NOT DETECTED NOT DETECTED Final  ? Entamoeba histolytica NOT DETECTED NOT DETECTED Final  ? Giardia lamblia NOT DETECTED NOT DETECTED Final  ? Adenovirus F40/41 NOT DETECTED NOT DETECTED Final  ? Astrovirus NOT DETECTED NOT DETECTED Final  ? Norovirus GI/GII NOT DETECTED NOT DETECTED Final  ? Rotavirus A NOT DETECTED NOT DETECTED Final  ? Sapovirus (I, II, IV, and V) NOT DETECTED NOT DETECTED Final  ?  Comment: Performed at [facility], [address], [place], [place] [address]  ?C Difficile Quick Screen (NO PCR Reflex)     Status: None  ? Collection Time: 01/18/22  1:28 PM  ? Specimen: Stool  ?Result Value Ref Range Status  ? C Diff antigen NEGATIVE NEGATIVE Final  ? C Diff toxin NEGATIVE NEGATIVE Final  ? C Diff interpretation No C. difficile detected.  Final  ?  Comment: Performed at [facility], [place] [address] [place], [place] [address]  ?Culture,  blood (Routine X 2) w Reflex to ID Panel     Status: None (Preliminary result)  ? Collection Time: 01/20/22 10:58 PM  ? Specimen: BLOOD  ?Result Value Ref Range Status  ? Specimen Description   Final  ?  BLOOD RIGHT ANTECUBITAL ?Performed at [facility], [place] [address], [place], [place] [address] ?  ? Special Requests   Final  ?  BOTTLES DRAWN AEROBIC AND ANAEROBIC Blood Culture adequate volume ?Performed at [facility], [place] [address], [place], [place] [address] ?  ?  Culture   Final  ?  NO GROWTH 4 DAYS ?Performed at [facility], [place] [address] [place], [place] [address] ?  ? Report Status PENDING  Incomplete  ?Culture, blood (Routine X 2) w Reflex to ID Panel     Status: None (Preliminary result)  ? Collection Time: 01/20/22 11:09 PM  ? Specimen: BLOOD  ?Result Value Ref Range Status  ? Specimen Description   Final  ?  BLOOD LEFT ANTECUBITAL ?Performed at [facility], [place] [address] [place], [place] [address] ?  ? Special Requests   Final  ?  BOTTLES DRAWN AEROBIC ONLY Blood Culture adequate volume ?Performed at [facility], [place] [address], [place], [place] [address] ?  ? Culture   Final  ?  NO GROWTH 4 DAYS ?Performed at [facility], [place] [address], [place], [place] [address] ?  ? Report Status PENDING  Incomplete  ?Urine Culture     Status: None  ? Collection Time: 01/23/22  1:37 PM  ? Specimen: Urine, Clean Catch  ?Result Value Ref Range Status  ? Specimen Description   Final  ?  URINE, CLEAN CATCH ?Performed at [facility], [place] [address] [place], [place] [address] ?  ? Special Requests   Final  ?  NONE ?Performed at [facility], [place] [address], [place], [place] [address] ?  ? Culture   Final  ?  NO GROWTH ?Performed at [facility], [place] [address] [place], [place] [address] ?  ? Report Status 01/24/2022 FINAL  Final  ?Body fluid culture w Gram Stain     Status: None  (Preliminary result)  ? Collection Time: 01/23/22  2:24 PM  ? Specimen: PATH Cytology Pleural fluid  ?Result Value Ref Range Status  ? Specimen Description   Final  ?  PLEURAL ?Performed at [facility]

## 2022-01-25 NOTE — Progress Notes (Addendum)
[facility] Progress Note ? ?Laurie Terry 56 y.o. [birth year] ? ?Subjective: ?Seen and examined lying in bed.  Patient notes she had few bowel movements yesterday 2 or more loose but none were completely formed.  Abdominal pain is much improved today.  She feels much better today compared to yesterday.  No fevers. ? ?ROS : Review of Systems  ?Gastrointestinal:  Positive for abdominal pain and diarrhea. Negative for blood in stool, constipation, heartburn, melena, nausea and vomiting.  ?Genitourinary:  Negative for dysuria and urgency.  ? ? ? ?Objective: ?Vital signs in last 24 hours: ?Vitals:  ? 01/24/22 2255 01/25/22 0533  ?BP:  (!) 101/53  ?Pulse:  65  ?Resp:  19  ?Temp: 98.3 ?F (36.8 ?C) 98.5 ?F (36.9 ?C)  ?SpO2:  94%  ? ? ?Physical Exam: ? ?General:  Alert, cooperative, no distress, appears stated age  ?Head:  Normocephalic, without obvious abnormality, atraumatic  ?Eyes:  Anicteric sclera, EOM's intact  ?Lungs:   Clear to auscultation bilaterally, respirations unlabored  ?Heart:  Regular rate and rhythm, S1, S2 normal  ?Abdomen:   Soft, RUQ tenderness without guarding, bowel sounds active all four quadrants,  no masses,   ?Extremities: Extremities normal, atraumatic, no  edema  ?Pulses: 2+ and symmetric  ? ? ?Lab Results: ?Recent Labs  ?  01/24/22 ?0405 01/25/22 ?2500  ?NA 140 143  ?K 3.7 3.1*  ?CL 106 105  ?CO2 27 28  ?GLUCOSE 197* 98  ?BUN <5* 10  ?CREATININE 0.38* 0.44  ?CALCIUM 8.1* 8.2*  ? ?Recent Labs  ?  01/24/22 ?0405  ?AST 15  ?ALT 26  ?ALKPHOS 80  ?BILITOT 0.4  ?PROT 5.7*  ?ALBUMIN 2.4*  ? ?Recent Labs  ?  01/24/22 ?0405 01/25/22 ?0632  ?WBC 10.0 11.4*  ?HGB 11.2* 9.7*  ?HCT 34.1* 29.8*  ?MCV 90.0 89.2  ?PLT 354 369  ? ?No results for input(s): LABPROT, INR in the last 72 hours. ? ? ? ?Assessment ?Abdominal pain ?Diarrhea ?pneumonia ?  ?Abdominal pain improving.  ? ?hemoglobin stable at 9.7 ?  ?Diarrhea is improving. previously had possible allergic reaction to Colestid. GI pathogen  panel and C. difficile negative.  Possible diarrhea due to antibiotics.  Notes a little improvement with cholestyramine.  Encouraged to take today if she continues to have loose stools. ? ?Patient is currently on metronidazole and ceftriaxone for concern of community-acquired pneumonia.  Reports no fevers and is feeling much better today. ?  ?Flex sigmoidoscopy 01/21/2022 ?Fair colon prep ?Entire examined colon normal, no evidence of microscopic colitis or IBD ?One 3 mm hyperplastic polyp in the rectum removed ? ? ?Plan: ?Continue supportive care ?Continue cholestyramine as needed for diarrhea.  ?continue heart healthy diet. ?[facility] will follow. ? ?Charlott Rakes PA-C ?01/25/2022, 9:15 AM ? ?Contact #  [phone] ? ?

## 2022-01-25 NOTE — Telephone Encounter (Signed)
Patient is scheduled for HFU on 02/07/2022 with BW at 11:30am. Follow up scheduled 03/29/2022 at 9:15am with MR- appointment reminders mailed-  nothing further needed. ?

## 2022-01-25 NOTE — Progress Notes (Signed)
?PROGRESS NOTE ? ? ? ?Laurie Terry  MCN:[id number] DOB: Nov 04, 1965 DOA: 01/15/2022 ?PCP: [name], MD  ? ?Brief Narrative: ?Very pleasant 56 year old with past medical history significant for melanoma, paroxysmal SVT, thoracic aortic aneurysm and other comorbidities presented to the ED complaining of abdominal pain after having outpatient ERCP. Patient was admitted for further evaluation, initially she was thought to have acute pancreatitis post ERCP.  Upper GI series and was negative for biliary leak or any duodenal leak. Per Dr. Watt Climes, Patient might had small retroperitoneal perforation. GI continue to follow.  ? ?Hospital course complicated by fever thought to be related to possible pneumonia, Bilateral Pleural effusion, worse on the right, Diarrhea of unclear etiology. Possible allergic reaction to Colestid and Cefepime.   ? ? ?Assessment & Plan: ?  ?Principal Problem: ?  Post-ERCP acute pancreatitis ?Active Problems: ?  Choledocholithiasis ?  Paroxysmal SVT (supraventricular tachycardia) (HCC) ?  GERD without esophagitis ?  Acute pancreatitis ?  Bilateral pleural effusion ? ?1-Post ERCP Complication;  Microperforation following ERCP.  ?CT scan abdomen pelvis 5/01; Increasing loculated gas and fluid in the gallbladder fossa. New right retroperitoneal fluid inferior to the liver and lateral to kidney.  ?GI following. Repeated CT scan with stable finding. No drainage fluid.  ?-Lipase 32. ?-abdominal pain improving. Related to microperforation.  ? ?2-Fever:  ?Appreciate ID evaluation.  ?-UA negative. Blood Culture no growth to date. Pleural fluid no growth to date.  ?-Concern for intra-abdominal process.  ?-Antibiotics change to ceftriaxone and flagyl yesterday. She tolerated ceftriaxone.  ?-Per ID improvement appears to have been related to thoracentesis which was felt to be reactive from abdominal process. Unable to rule out underlying intra-abdominal infection. Recommend Augmentin for 7 days. Plan to  observe overnight due to history of allergies.  ? ?Possible Allergic reaction; to Cefepime. ?She also received Vancomycin that day.  ?She develops coughing spell, rash in her ears, lips tingling yesterday night. Her symptoms improved last night. She received prednisone, benadryl, Albuterol. There was not any evidence of angioedema.  ? ?BL pleural effusion worse on the right:  ?PNA was rule out/  ?-Chest x ray 4/30: New increasing effusion bilaterally, right greater than left, with underline atelectatic changes.  ?-Received  IV ceftriaxone and Azithromycin  ?-Pulmonary consulted for further evaluation of pleural effusion.  ?-Dr Bobbe Medico recommended thoracentesis and ECHO. ECHO normal Ef, mild Diastolic dysfunction.  ?-Thoracentesis yield 300 cc fluid.  ?-Dr Chase Caller think effusion is likely reactive from abdominal process.  ?-Pleural fluid culture; no growth to date.  ?She has follow up with pulmonology. She will be discharge on Augmentin for 7 days.  ? ?-Diarrhea: ?Unclear etiology.  ?C diff and GI pathogen negative.  ?S/P Flexible sigmoidoscopy 5/01: Normal mucosa. Small polyp removed. Biopsy negative for inflammatory bowel diseases, microscopic Colitis and collagenous colitis.  ?Received one dose of  Cholestyramine 5/03.  ?Report no BM yesterday after eating lunch. She had one small episode of watery with small consistency stool.  ?Overall improved.  ? ?Hypokalemia; Replete orally.  ? ?Paroxysmal SVT; continue with metoprolol.  ?Leokocytosis; mild elevation. Likely related to prednisone.  ? ?Estimated body mass index is 25.58 kg/m? as calculated from the following: ?  Height as of this encounter: '5\' 4"'$  (1.626 m). ?  Weight as of this encounter: 67.6 kg. ? ? ?DVT prophylaxis: Lovenox ?Code Status: Full code ?Family Communication: care discussed with patient.  ?Disposition Plan:  ?Status is: Inpatient ?Remains inpatient appropriate because: remain in the hospital for management of Fever,  pleural effusion.   ? ? ? ?Consultants:  ?CCM ?GI ? ?Procedures:  ? ? ?Antimicrobials:  ? ? ?Subjective: ?She is feeling better. Abdominal pian has improved.  ?She is willing to stay overnight to monitor for any allergic reaction.  ? ?Objective: ?Vitals:  ? 01/24/22 2046 01/24/22 2255 01/25/22 0533 01/25/22 1412  ?BP: (!) 106/56  (!) 101/53 92/67  ?Pulse: 81  65 78  ?Resp: '18  19 16  '$ ?Temp: 98.8 ?F (37.1 ?C) 98.3 ?F (36.8 ?C) 98.5 ?F (36.9 ?C) 98.6 ?F (37 ?C)  ?TempSrc: Oral Oral Oral Oral  ?SpO2: 98%  94% 98%  ?Weight:      ?Height:      ? ? ?Intake/Output Summary (Last 24 hours) at 01/25/2022 1455 ?Last data filed at 01/25/2022 1300 ?Gross per 24 hour  ?Intake 2270.5 ml  ?Output 4 ml  ?Net 2266.5 ml  ? ? ?Filed Weights  ? 01/15/22 1949 01/21/22 1324  ?Weight: 67.6 kg 67.6 kg  ? ? ?Examination: ? ?General exam: NAD ?Respiratory system: CTA ?Cardiovascular system: S 1, S 2 RRR ?Gastrointestinal system: BS present, soft, nt ?Central nervous system: Alert ? ? ? ?Data Reviewed: I have personally reviewed following labs and imaging studies ? ?CBC: ?Recent Labs  ?Lab 01/20/22 ?0330 01/21/22 ?0805 01/22/22 ?3664 01/23/22 ?1323 01/24/22 ?0405 01/25/22 ?4034  ?WBC 10.3 10.8* 9.8 9.9 10.0 11.4*  ?NEUTROABS 8.4* 8.3* 6.9  --   --   --   ?HGB 10.2* 10.8* 10.6* 11.3* 11.2* 9.7*  ?HCT 30.8* 33.0* 31.9* 35.4* 34.1* 29.8*  ?MCV 89.5 89.9 89.9 91.7 90.0 89.2  ?PLT 202 259 261 365 354 369  ? ? ?Basic Metabolic Panel: ?Recent Labs  ?Lab 01/21/22 ?0322 01/21/22 ?0805 01/22/22 ?7425 01/23/22 ?9563 01/24/22 ?0405 01/25/22 ?8756  ?NA  --  137 135 138 140 143  ?K  --  3.9 3.6 3.3* 3.7 3.1*  ?CL  --  105 104 103 106 105  ?CO2  --  24 18* '27 27 28  '$ ?GLUCOSE  --  73 68* 105* 197* 98  ?BUN  --  7 <5* <5* <5* 10  ?CREATININE  --  0.63 0.59 0.58 0.38* 0.44  ?CALCIUM  --  7.6* 7.7* 7.8* 8.1* 8.2*  ?MG 1.9  --   --   --   --   --   ? ? ?GFR: ?Estimated Creatinine Clearance: 74.3 mL/min (by C-G formula based on SCr of 0.44 mg/dL). ?Liver Function Tests: ?Recent Labs   ?Lab 01/19/22 ?1225 01/24/22 ?0405  ?AST 17 15  ?ALT 15 26  ?ALKPHOS 56 80  ?BILITOT 0.6 0.4  ?PROT 5.9* 5.7*  ?ALBUMIN 3.0* 2.4*  ? ? ?Recent Labs  ?Lab 01/23/22 ?1323  ?LIPASE 32  ?AMYLASE 56  ? ? ?No results for input(s): AMMONIA in the last 168 hours. ?Coagulation Profile: ?No results for input(s): INR, PROTIME in the last 168 hours. ?Cardiac Enzymes: ?No results for input(s): CKTOTAL, CKMB, CKMBINDEX, TROPONINI in the last 168 hours. ?BNP (last 3 results) ?No results for input(s): PROBNP in the last 8760 hours. ?HbA1C: ?No results for input(s): HGBA1C in the last 72 hours. ?CBG: ?Recent Labs  ?Lab 01/23/22 ?0808  ?GLUCAP 101*  ? ? ?Lipid Profile: ?No results for input(s): CHOL, HDL, LDLCALC, TRIG, CHOLHDL, LDLDIRECT in the last 72 hours. ?Thyroid Function Tests: ?No results for input(s): TSH, T4TOTAL, FREET4, T3FREE, THYROIDAB in the last 72 hours. ?Anemia Panel: ?No results for input(s): VITAMINB12, FOLATE, FERRITIN, TIBC, IRON, RETICCTPCT in the last 72  hours. ?Sepsis Labs: ?Recent Labs  ?Lab 01/21/22 ?0924 01/23/22 ?1323 01/23/22 ?1550 01/23/22 ?1835  ?PROCALCITON 0.43 <0.10  --   --   ?LATICACIDVEN  --   --  2.7* 1.3  ? ? ? ?Recent Results (from the past 240 hour(s))  ?Gastrointestinal Panel by PCR , Stool     Status: None  ? Collection Time: 01/18/22  1:28 PM  ? Specimen: Stool  ?Result Value Ref Range Status  ? Campylobacter species NOT DETECTED NOT DETECTED Final  ? Plesimonas shigelloides NOT DETECTED NOT DETECTED Final  ? Salmonella species NOT DETECTED NOT DETECTED Final  ? Yersinia enterocolitica NOT DETECTED NOT DETECTED Final  ? Vibrio species NOT DETECTED NOT DETECTED Final  ? Vibrio cholerae NOT DETECTED NOT DETECTED Final  ? Enteroaggregative E coli (EAEC) NOT DETECTED NOT DETECTED Final  ? Enteropathogenic E coli (EPEC) NOT DETECTED NOT DETECTED Final  ? Enterotoxigenic E coli (ETEC) NOT DETECTED NOT DETECTED Final  ? Shiga like toxin producing E coli (STEC) NOT DETECTED NOT DETECTED Final  ?  Shigella/Enteroinvasive E coli (EIEC) NOT DETECTED NOT DETECTED Final  ? Cryptosporidium NOT DETECTED NOT DETECTED Final  ? Cyclospora cayetanensis NOT DETECTED NOT DETECTED Final  ? Entamoeba histolytica NOT D

## 2022-01-25 NOTE — Plan of Care (Signed)
  Problem: Activity: Goal: Risk for activity intolerance will decrease Outcome: Progressing   Problem: Coping: Goal: Level of anxiety will decrease Outcome: Progressing   Problem: Pain Managment: Goal: General experience of comfort will improve Outcome: Progressing   Problem: Safety: Goal: Ability to remain free from injury will improve Outcome: Progressing   

## 2022-01-25 NOTE — TOC Progression Note (Signed)
Transition of Care (TOC) - Progression Note  ? ? ?Patient Details  ?Name: Laurie Terry ?MRN: [id number] ?Date of Birth: [birth year] ? ?Transition of Care (TOC) CM/SW Contact  ?Purcell Mouton, RN ?Phone Number: ?01/25/2022, 3:05 PM ? ?Clinical Narrative:    ? ?Plan to discharge when stable with no needs at present time.  ? ?Expected Discharge Plan: Home/Self Care ?Barriers to Discharge: Continued Medical Work up ? ?Expected Discharge Plan and Services ?Expected Discharge Plan: Home/Self Care ?  ?  ?  ?Living arrangements for the past 2 months: [place] ?                ?  ?  ?  ?  ?  ?  ?  ?  ?  ?  ? ? ?Social Determinants of Health (SDOH) Interventions ?  ? ?Readmission Risk Interventions ?   ? View : No data to display.  ?  ?  ?  ? ? ?

## 2022-01-26 DIAGNOSIS — R509 Fever, unspecified: Secondary | ICD-10-CM

## 2022-01-26 DIAGNOSIS — R1013 Epigastric pain: Secondary | ICD-10-CM

## 2022-01-26 LAB — BASIC METABOLIC PANEL
Anion gap: 9 (ref 5–15)
BUN: 7 mg/dL (ref 6–20)
CO2: 29 mmol/L (ref 22–32)
Calcium: 8.4 mg/dL — ABNORMAL LOW (ref 8.9–10.3)
Chloride: 100 mmol/L (ref 98–111)
Creatinine, Ser: 0.58 mg/dL (ref 0.44–1.00)
GFR, Estimated: >60 mL/min (ref >60)
Glucose, Bld: 101 mg/dL — ABNORMAL HIGH (ref 70–99)
Potassium: 3.7 mmol/L (ref 3.5–5.1)
Sodium: 138 mmol/L (ref 135–145)

## 2022-01-26 LAB — CULTURE, BLOOD (ROUTINE X 2)
Culture: NO GROWTH
Culture: NO GROWTH
Special Requests: ADEQUATE
Special Requests: ADEQUATE

## 2022-01-26 MED ORDER — POTASSIUM CHLORIDE CRYS ER 10 MEQ PO TBCR: 10.0000 meq | EXTENDED_RELEASE_TABLET | Freq: Every day | ORAL | 0 refills | Status: DC

## 2022-01-26 MED ORDER — CHOLESTYRAMINE LIGHT 4 G PO PACK: 4.0000 g | PACK | Freq: Two times a day (BID) | ORAL | 0 refills | Status: DC

## 2022-01-26 MED ORDER — AMOXICILLIN-POT CLAVULANATE 875-125 MG PO TABS: 1.0000 | ORAL_TABLET | Freq: Two times a day (BID) | ORAL | 0 refills | Status: DC

## 2022-01-26 MED ORDER — METOPROLOL TARTRATE 25 MG PO TABS: 25.0000 mg | ORAL_TABLET | Freq: Every day | ORAL | 0 refills | Status: DC | PRN

## 2022-01-26 NOTE — Discharge Summary (Signed)
Physician Discharge Summary  ?Laurie Terry AJO:[id number] DOB: Jul 06, 1966 DOA: 01/15/2022 ? ?PCP: Laurie Caraway, MD ? ?Admit date: 01/15/2022 ?Discharge date: 01/26/2022 ? ?Admitted From: Home  ?Disposition:  Home  ? ?Recommendations for Outpatient Follow-up:  ?Follow up with PCP in 1-2 weeks to check labs and electrolytes.  ?Please obtain BMP/CBC in one week ?Please follow up on the following pending results: final pleural fluid culture results.  ?Needs to follow up with Pulmonology to follow on cough and pleural effusion.  ?Needs to follow up with GI to determine if she needs repeat Imagine.  ? ?Home Health: None ? ?Discharge Condition: Stable.  ?CODE STATUS: Full Code ?Diet recommendation: Heart Healthy  ? ?Brief/Interim Summary: ?Very pleasant 56 year old with past medical history significant for melanoma, paroxysmal SVT, thoracic aortic aneurysm and other comorbidities presented to the ED complaining of abdominal pain after having outpatient ERCP. Patient was admitted for further evaluation, initially she was thought to have acute pancreatitis post ERCP.  Upper GI series and was negative for biliary leak or any duodenal leak. Per Dr. Watt Climes, Patient might had small retroperitoneal perforation. GI continue to follow.  ?  ?Hospital course complicated by fever, Diarrhea related to antibiotics.  Possible allergic reaction to Colestid and Cefepime.   ?  ?  ?1-Post ERCP Complication;  Microperforation following ERCP: ?CT scan abdomen pelvis 5/01; Increasing loculated gas and fluid in the gallbladder fossa. New right retroperitoneal fluid inferior to the liver and lateral to kidney.  ?GI following. Repeated CT scan with stable finding. No drainage fluid collection.  ?-Lipase 32. ?-Abdominal pain improving. Related to microperforation.  ?  ?2-Fever:  ?-Per ID improvement appears to have been related to thoracentesis which was felt to be reactive from abdominal process. Unable to rule out underlying intra-abdominal  infection. Recommend Augmentin for 7 days.  ?Patient tolerated Augmentin, no evidence of allergic reaction.  ?Appreciate ID evaluation.  ?-UA negative. Blood Culture no growth to date. Pleural fluid no growth to date.  ?-Repeated CT abdomen pelvis 5/04: stable finding, no drainage fluid collection identified.  ? ?  ?Possible Allergic reaction; to Cefepime. Of Note: she tolerated Ceftriaxone the following day after reaction.  ?She also received Vancomycin that day.  ?She develops coughing spell, rash in her ears, lips tingling yesterday night. Her symptoms improved last night. She received prednisone, benadryl, Albuterol. There was not any evidence of angioedema.  ?  ?BL pleural effusion worse on the right:  ?PNA was rule out/  ?-Chest x ray 4/30: New increasing effusion bilaterally, right greater than left, with underline atelectatic changes.  ?-Received  IV ceftriaxone and Azithromycin  ?-Pulmonary consulted for further evaluation of pleural effusion.  ?-Dr Bobbe Medico recommended thoracentesis and ECHO. ECHO normal Ef, mild Diastolic dysfunction.  ?-Thoracentesis yield 300 cc fluid.  ?-Dr Chase Caller think effusion is likely reactive from abdominal process.  ?-Pleural fluid culture; no growth to date.  ?She has follow up with pulmonology. She will be discharge on Augmentin for 7 days.  ?  ?-Diarrhea: ?Could be related to antibiotics.  ?C diff and GI pathogen negative.  ?S/P Flexible sigmoidoscopy 5/01: Normal mucosa. Small polyp removed. Biopsy negative for inflammatory bowel diseases, microscopic Colitis and collagenous colitis.  ?Discharge on Cholestyramine BID>  ?Had explosive episode of diarrhea after she took Augmentin. Plan to discharge on Cholestyramine BID. Encourage hydration.  ?  ?Hypokalemia; replaced. Discharge on low dose supplement in the  setting of diarrhea.  ?  ?Paroxysmal SVT; continue with metoprolol.  ?Leokocytosis; mild elevation. Likely  related to prednisone.  ?  ?Estimated body mass index is  25.58 kg/m? as calculated from the following: ?  Height as of this encounter: '5\' 4"'$  (1.626 m). ?  Weight as of this encounter: 67.6 kg. ?  ?  ?Discharge Diagnoses:  ?Post ERCP Complication;  Microperforation following ERCP ?Diarrhea ?Active Problems: ?  Choledocholithiasis ?  Paroxysmal SVT (supraventricular tachycardia) (HCC) ?  GERD without esophagitis ?  Pleural effusion, bilateral ?  Fever ? ? ? ?Discharge Instructions ? ?Discharge Instructions   ? ? Diet - low sodium heart healthy   Complete by: As directed ?  ? Increase activity slowly   Complete by: As directed ?  ? ?  ? ?Allergies as of 01/26/2022   ? ?   Reactions  ? Cefepime Cough  ? Patient develops cough, rash in her ears.   ? Contrast Media [iodinated Contrast Media] Shortness Of Breath  ? Low blood pressure   ? Iodine Shortness Of Breath, Swelling  ? Other Hives, Swelling  ? Seafood  ? Ampicillin Hives  ? Childhood allergy  ? Avocado Itching  ? Tongue and mouth  ? Levofloxacin Hives  ? Pt already has aortic aneurysm.  ? Pantoprazole Other (See Comments)  ? Joint aches, fever, peeling of skin  ? Septra [bactrim] Hives  ? Sulfamethoxazole-trimethoprim Other (See Comments), Hives  ? Vancomycin Cough  ? Patient develops cough, rash ears, tingling lips after receiving Cefepime and vancomycin 01/23/2022  ? Colestipol Hcl Rash  ? ?  ? ?  ?Medication List  ?  ? ?STOP taking these medications   ? ?ibuprofen 200 MG tablet ?Commonly known as: ADVIL ?  ? ?  ? ?TAKE these medications   ? ?acetaminophen 500 MG tablet ?Commonly known as: TYLENOL ?Take 1,000 mg by mouth every 6 (six) hours as needed for mild pain or fever. ?  ?Adapalene 0.3 % gel ?Apply 1 application. topically 3 (three) times a week. ?  ?albuterol 108 (90 Base) MCG/ACT inhaler ?Commonly known as: ProAir HFA ?Inhale 2 puffs into the lungs every 4 (four) hours as needed for wheezing or shortness of breath (coughing fits). ?  ?amoxicillin-clavulanate 875-125 MG tablet ?Commonly known as: AUGMENTIN ?Take 1  tablet by mouth every 12 (twelve) hours for 6 days. ?  ?cholestyramine light 4 g packet ?Commonly known as: PREVALITE ?Take 1 packet (4 g total) by mouth 2 (two) times daily. ?  ?eletriptan 40 MG tablet ?Commonly known as: RELPAX ?40 mg every 2 (two) hours as needed for migraine. ?  ?EPINEPHrine 0.3 mg/0.3 mL Soaj injection ?Commonly known as: EPI-PEN ?Inject 0.3 mg into the muscle as needed for anaphylaxis. ?  ?famotidine 20 MG tablet ?Commonly known as: PEPCID ?Take 20 mg by mouth daily as needed for heartburn or indigestion. ?  ?fluticasone 50 MCG/ACT nasal spray ?Commonly known as: FLONASE ?2 sprays 2 (two) times daily as needed for allergies. ?  ?ketotifen 0.025 % ophthalmic solution ?Commonly known as: ZADITOR ?1 drop daily as needed (Itching eye and allergies). ?  ?metoprolol tartrate 25 MG tablet ?Commonly known as: LOPRESSOR ?Take 1 tablet (25 mg total) by mouth daily as needed (svt). ?  ?potassium chloride 10 MEQ tablet ?Commonly known as: KLOR-CON M ?Take 1 tablet (10 mEq total) by mouth daily for 3 days. ?  ? ?  ? ? Follow-up Information   ? ? Laurie Caraway, MD Follow up in 1 week(s).   ?Specialty: Family Medicine ?Contact information: ?[address] ?[place] [address] ?  [phone] ? ? ?  ?  ? ? Brand Males, MD Follow up.   ?Specialty: Pulmonary Disease ?Why: You have an appointment :  02/07/2022 with BW at 11:30am. Follow up scheduled 03/29/2022 at 9:15am with MR- ?Contact information: ?[address] ?[address] ?[place] [address] ?[phone] ? ? ?  ?  ? ? Clarene Essex, MD Follow up in 2 week(s).   ?Specialty: Gastroenterology ?Why: please call to arrange appointment. ?Contact information: ?[address] ?[place] [address] ?[phone] ? ? ?  ?  ? ?  ?  ? ?  ? ?Allergies  ?Allergen Reactions  ? Cefepime Cough  ?  Patient develops cough, rash in her ears.   ? Contrast Media [Iodinated Contrast Media] Shortness Of Breath  ?  Low blood pressure   ? Iodine Shortness Of  Breath and Swelling  ? Other Hives and Swelling  ?  Seafood  ? Ampicillin Hives  ?  Childhood allergy  ? Avocado Itching  ?  Tongue and mouth  ? Levofloxacin Hives  ?  Pt already has aortic aneurysm.  ? Pant

## 2022-01-26 NOTE — Plan of Care (Signed)
?  Problem: Activity: ?Goal: Risk for activity intolerance will decrease ?Outcome: Adequate for Discharge ?  ?Problem: Nutrition: ?Goal: Adequate nutrition will be maintained ?Outcome: Adequate for Discharge ?  ?Problem: Pain Managment: ?Goal: General experience of comfort will improve ?Outcome: Adequate for Discharge ?  ?Problem: Safety: ?Goal: Ability to remain free from injury will improve ?Outcome: Adequate for Discharge ?  ?

## 2022-01-27 ENCOUNTER — Inpatient Hospital Stay (HOSPITAL_COMMUNITY)
Admission: EM | Admit: 2022-01-27 | Discharge: 2022-02-02 | DRG: 862 | Disposition: A | Discharge status: Home / Self Care | Payer: 59 | Attending: Internal Medicine | Admitting: Internal Medicine

## 2022-01-27 ENCOUNTER — Other Ambulatory Visit: Payer: Self-pay

## 2022-01-27 ENCOUNTER — Emergency Department (HOSPITAL_COMMUNITY): Payer: 59

## 2022-01-27 DIAGNOSIS — B999 Unspecified infectious disease: Secondary | ICD-10-CM | POA: Diagnosis not present

## 2022-01-27 DIAGNOSIS — T8143XA Infection following a procedure, organ and space surgical site, initial encounter: Secondary | ICD-10-CM | POA: Diagnosis not present

## 2022-01-27 DIAGNOSIS — K659 Peritonitis, unspecified: Secondary | ICD-10-CM

## 2022-01-27 DIAGNOSIS — D75838 Other thrombocytosis: Secondary | ICD-10-CM | POA: Diagnosis present

## 2022-01-27 DIAGNOSIS — I471 Supraventricular tachycardia, unspecified: Secondary | ICD-10-CM | POA: Diagnosis present

## 2022-01-27 DIAGNOSIS — R059 Cough, unspecified: Secondary | ICD-10-CM | POA: Diagnosis present

## 2022-01-27 DIAGNOSIS — Z8349 Family history of other endocrine, nutritional and metabolic diseases: Secondary | ICD-10-CM

## 2022-01-27 DIAGNOSIS — Y92239 Unspecified place in hospital as the place of occurrence of the external cause: Secondary | ICD-10-CM | POA: Diagnosis not present

## 2022-01-27 DIAGNOSIS — J9811 Atelectasis: Secondary | ICD-10-CM | POA: Diagnosis present

## 2022-01-27 DIAGNOSIS — R197 Diarrhea, unspecified: Secondary | ICD-10-CM | POA: Diagnosis present

## 2022-01-27 DIAGNOSIS — B952 Enterococcus as the cause of diseases classified elsewhere: Secondary | ICD-10-CM | POA: Diagnosis present

## 2022-01-27 DIAGNOSIS — R1011 Right upper quadrant pain: Secondary | ICD-10-CM

## 2022-01-27 DIAGNOSIS — E7849 Other hyperlipidemia: Secondary | ICD-10-CM | POA: Diagnosis present

## 2022-01-27 DIAGNOSIS — K219 Gastro-esophageal reflux disease without esophagitis: Secondary | ICD-10-CM | POA: Diagnosis present

## 2022-01-27 DIAGNOSIS — J90 Pleural effusion, not elsewhere classified: Secondary | ICD-10-CM | POA: Diagnosis present

## 2022-01-27 DIAGNOSIS — R058 Other specified cough: Secondary | ICD-10-CM

## 2022-01-27 DIAGNOSIS — Z87891 Personal history of nicotine dependence: Secondary | ICD-10-CM

## 2022-01-27 DIAGNOSIS — K651 Peritoneal abscess: Secondary | ICD-10-CM

## 2022-01-27 DIAGNOSIS — Z87410 Personal history of cervical dysplasia: Secondary | ICD-10-CM

## 2022-01-27 DIAGNOSIS — K6811 Postprocedural retroperitoneal abscess: Secondary | ICD-10-CM | POA: Diagnosis present

## 2022-01-27 DIAGNOSIS — Z8261 Family history of arthritis: Secondary | ICD-10-CM

## 2022-01-27 DIAGNOSIS — T361X5A Adverse effect of cephalosporins and other beta-lactam antibiotics, initial encounter: Secondary | ICD-10-CM | POA: Diagnosis not present

## 2022-01-27 DIAGNOSIS — N151 Renal and perinephric abscess: Secondary | ICD-10-CM | POA: Diagnosis present

## 2022-01-27 DIAGNOSIS — Z881 Allergy status to other antibiotic agents status: Secondary | ICD-10-CM

## 2022-01-27 DIAGNOSIS — Z823 Family history of stroke: Secondary | ICD-10-CM

## 2022-01-27 DIAGNOSIS — Z91013 Allergy to seafood: Secondary | ICD-10-CM

## 2022-01-27 DIAGNOSIS — Z888 Allergy status to other drugs, medicaments and biological substances status: Secondary | ICD-10-CM

## 2022-01-27 DIAGNOSIS — R509 Fever, unspecified: Principal | ICD-10-CM

## 2022-01-27 DIAGNOSIS — Z85820 Personal history of malignant melanoma of skin: Secondary | ICD-10-CM

## 2022-01-27 DIAGNOSIS — Z9049 Acquired absence of other specified parts of digestive tract: Secondary | ICD-10-CM

## 2022-01-27 DIAGNOSIS — Z803 Family history of malignant neoplasm of breast: Secondary | ICD-10-CM

## 2022-01-27 DIAGNOSIS — I712 Thoracic aortic aneurysm, without rupture, unspecified: Secondary | ICD-10-CM | POA: Diagnosis present

## 2022-01-27 DIAGNOSIS — Z91041 Radiographic dye allergy status: Secondary | ICD-10-CM

## 2022-01-27 DIAGNOSIS — Z20822 Contact with and (suspected) exposure to covid-19: Secondary | ICD-10-CM | POA: Diagnosis present

## 2022-01-27 DIAGNOSIS — Y838 Other surgical procedures as the cause of abnormal reaction of the patient, or of later complication, without mention of misadventure at the time of the procedure: Secondary | ICD-10-CM | POA: Diagnosis present

## 2022-01-27 LAB — BODY FLUID CULTURE W GRAM STAIN
Culture: NO GROWTH
Gram Stain: NONE SEEN

## 2022-01-27 LAB — CBC WITH DIFFERENTIAL/PLATELET
Abs Immature Granulocytes: 0.65 10*3/uL — ABNORMAL HIGH (ref 0.00–0.07)
Basophils Absolute: 0.1 10*3/uL (ref 0.0–0.1)
Basophils Relative: 1 %
Eosinophils Absolute: 0.3 10*3/uL (ref 0.0–0.5)
Eosinophils Relative: 1 %
HCT: 36.4 % (ref 36.0–46.0)
Hemoglobin: 12.3 g/dL (ref 12.0–15.0)
Immature Granulocytes: 3 %
Lymphocytes Relative: 10 %
Lymphs Abs: 2 10*3/uL (ref 0.7–4.0)
MCH: 30.1 pg (ref 26.0–34.0)
MCHC: 33.8 g/dL (ref 30.0–36.0)
MCV: 89.2 fL (ref 80.0–100.0)
Monocytes Absolute: 1 10*3/uL (ref 0.1–1.0)
Monocytes Relative: 5 %
Neutro Abs: 15.7 10*3/uL — ABNORMAL HIGH (ref 1.7–7.7)
Neutrophils Relative %: 80 %
Platelets: 430 10*3/uL — ABNORMAL HIGH (ref 150–400)
RBC: 4.08 MIL/uL (ref 3.87–5.11)
RDW: 14.5 % (ref 11.5–15.5)
WBC: 19.7 10*3/uL — ABNORMAL HIGH (ref 4.0–10.5)
nRBC: 0 % (ref 0.0–0.2)

## 2022-01-27 LAB — URINALYSIS, ROUTINE W REFLEX MICROSCOPIC
Bilirubin Urine: NEGATIVE
Glucose, UA: NEGATIVE mg/dL
Hgb urine dipstick: NEGATIVE
Ketones, ur: NEGATIVE mg/dL
Leukocytes,Ua: NEGATIVE
Nitrite: NEGATIVE
Protein, ur: NEGATIVE mg/dL
Specific Gravity, Urine: 1.011 (ref 1.005–1.030)
pH: 8 (ref 5.0–8.0)

## 2022-01-27 LAB — COMPREHENSIVE METABOLIC PANEL
ALT: 25 U/L (ref 0–44)
AST: 28 U/L (ref 15–41)
Albumin: 3.1 g/dL — ABNORMAL LOW (ref 3.5–5.0)
Alkaline Phosphatase: 71 U/L (ref 38–126)
Anion gap: 9 (ref 5–15)
BUN: 8 mg/dL (ref 6–20)
CO2: 27 mmol/L (ref 22–32)
Calcium: 8.5 mg/dL — ABNORMAL LOW (ref 8.9–10.3)
Chloride: 104 mmol/L (ref 98–111)
Creatinine, Ser: 0.53 mg/dL (ref 0.44–1.00)
GFR, Estimated: >60 mL/min (ref >60)
Glucose, Bld: 104 mg/dL — ABNORMAL HIGH (ref 70–99)
Potassium: 3.7 mmol/L (ref 3.5–5.1)
Sodium: 140 mmol/L (ref 135–145)
Total Bilirubin: 0.5 mg/dL (ref 0.3–1.2)
Total Protein: 6.8 g/dL (ref 6.5–8.1)

## 2022-01-27 LAB — RESP PANEL BY RT-PCR (FLU A&B, COVID) ARPGX2
Influenza A by PCR: NEGATIVE
Influenza B by PCR: NEGATIVE
SARS Coronavirus 2 by RT PCR: NEGATIVE

## 2022-01-27 LAB — LIPASE, BLOOD: Lipase: 68 U/L — ABNORMAL HIGH (ref 11–51)

## 2022-01-27 MED ORDER — EPINEPHRINE 0.3 MG/0.3ML IJ SOAJ
0.3000 mg | INTRAMUSCULAR | Status: DC | PRN
  Filled 2022-01-27: qty 0.6

## 2022-01-27 MED ORDER — METRONIDAZOLE 500 MG/100ML IV SOLN
500.0000 mg | Freq: Two times a day (BID) | INTRAVENOUS | Status: DC
  Administered 2022-01-28 – 2022-02-01 (×9): 500 mg via INTRAVENOUS
  Filled 2022-01-27 (×9): qty 100

## 2022-01-27 MED ORDER — ACETAMINOPHEN 650 MG RE SUPP: 650.0000 mg | Freq: Four times a day (QID) | RECTAL | Status: DC | PRN

## 2022-01-27 MED ORDER — SODIUM CHLORIDE 0.9% FLUSH
3.0000 mL | Freq: Two times a day (BID) | INTRAVENOUS | Status: DC
  Administered 2022-01-28 – 2022-02-02 (×10): 3 mL via INTRAVENOUS

## 2022-01-27 MED ORDER — CHOLESTYRAMINE LIGHT 4 G PO PACK
4.0000 g | PACK | Freq: Two times a day (BID) | ORAL | Status: DC
  Administered 2022-01-28 – 2022-02-01 (×5): 4 g via ORAL
  Filled 2022-01-27 (×12): qty 1

## 2022-01-27 MED ORDER — ACETAMINOPHEN 325 MG PO TABS
650.0000 mg | ORAL_TABLET | Freq: Four times a day (QID) | ORAL | Status: DC | PRN
  Administered 2022-01-28 – 2022-01-30 (×4): 650 mg via ORAL
  Filled 2022-01-27 (×4): qty 2

## 2022-01-27 MED ORDER — ALBUTEROL SULFATE (2.5 MG/3ML) 0.083% IN NEBU: 3.0000 mL | INHALATION_SOLUTION | RESPIRATORY_TRACT | Status: DC | PRN

## 2022-01-27 MED ORDER — CEFTRIAXONE SODIUM 1 G IJ SOLR: 1.0000 g | Freq: Once | INTRAMUSCULAR | Status: DC

## 2022-01-27 MED ORDER — CEFTRIAXONE SODIUM 2 G IJ SOLR
2.0000 g | INTRAMUSCULAR | Status: DC
Start: 2022-01-27 — End: 2022-02-01
  Administered 2022-01-28 – 2022-01-31 (×5): 2 g via INTRAVENOUS
  Filled 2022-01-27 (×6): qty 20

## 2022-01-27 MED ORDER — ENOXAPARIN SODIUM 40 MG/0.4ML IJ SOSY
40.0000 mg | PREFILLED_SYRINGE | INTRAMUSCULAR | Status: DC
  Administered 2022-01-28 – 2022-01-29 (×2): 40 mg via SUBCUTANEOUS
  Filled 2022-01-27 (×2): qty 0.4

## 2022-01-27 MED ORDER — ACETAMINOPHEN 500 MG PO TABS
1000.0000 mg | ORAL_TABLET | Freq: Once | ORAL | Status: AC
Start: 2022-01-27 — End: 2022-01-28
  Administered 2022-01-28: 1000 mg via ORAL
  Filled 2022-01-27 (×2): qty 2

## 2022-01-27 MED ORDER — METOPROLOL TARTRATE 25 MG PO TABS: 25.0000 mg | ORAL_TABLET | Freq: Every day | ORAL | Status: DC | PRN

## 2022-01-27 MED ORDER — FAMOTIDINE 20 MG PO TABS
20.0000 mg | ORAL_TABLET | Freq: Every day | ORAL | Status: DC | PRN
  Administered 2022-02-01: 20 mg via ORAL
  Filled 2022-01-27: qty 1

## 2022-01-27 NOTE — ED Provider Notes (Signed)
?[facility] DEPT ?Provider Note ? ? ?CSN: [id number] ?Arrival date & time: 01/27/22  1652 ? ?  ? ?History ? ?Chief Complaint  ?Patient presents with  ? Fever  ? ? ?Laurie Terry is a 56 y.o. female. ? ?Patient is a 57 year old female who presents with fever and worsening abdominal pain.  She has a complicated recent history.  She was admitted from April 25 to May 6.  She had developed what was felt to be a microperforation status post outpatient ERCP.  She had a loculated fluid collection in her gallbladder fossa and some right-sided retroperitoneal fluid.  She also had developed a pleural effusion which was felt to be reactive.  She had a thoracentesis.  She was treated with antibiotics and ultimately discharged on Augmentin for 7 days.  She was discharged yesterday.  She said that she previously had not had a fever in over 48 hours.  She had a fever of 101 today.  She also is having some increased pain in her right upper abdomen.  No shortness of breath.  No worsening cough.  She has had a little bit of a dry cough since she was admitted for this.  No nausea or vomiting. ? ? ?  ? ?Home Medications ?Prior to Admission medications   ?Medication Sig Start Date End Date Taking? Authorizing Provider  ?acetaminophen (TYLENOL) 500 MG tablet Take 1,000 mg by mouth every 6 (six) hours as needed for mild pain or fever.    [provider]  ?albuterol (PROAIR HFA) 108 (90 Base) MCG/ACT inhaler Inhale 2 puffs into the lungs every 4 (four) hours as needed for wheezing or shortness of breath (coughing fits). 05/23/21   Garnet Sierras, DO  ?amoxicillin-clavulanate (AUGMENTIN) 875-125 MG tablet Take 1 tablet by mouth every 12 (twelve) hours for 6 days. 01/26/22 02/01/22  Regalado, Belkys A, MD  ?cholestyramine light (PREVALITE) 4 g packet Take 1 packet (4 g total) by mouth 2 (two) times daily. 01/26/22   Regalado, Belkys A, MD  ?eletriptan (RELPAX) 40 MG tablet 40 mg every 2 (two) hours as needed  for migraine. 05/18/20   [provider]  ?EPINEPHrine 0.3 mg/0.3 mL IJ SOAJ injection Inject 0.3 mg into the muscle as needed for anaphylaxis.    [provider]  ?famotidine (PEPCID) 20 MG tablet Take 20 mg by mouth daily as needed for heartburn or indigestion.    [provider]  ?fluticasone (FLONASE) 50 MCG/ACT nasal spray 2 sprays 2 (two) times daily as needed for allergies. 07/14/20   [provider]  ?ketotifen (ZADITOR) 0.025 % ophthalmic solution 1 drop daily as needed (Itching eye and allergies).    [provider]  ?metoprolol tartrate (LOPRESSOR) 25 MG tablet Take 1 tablet (25 mg total) by mouth daily as needed (svt). 01/26/22   Regalado, Belkys A, MD  ?potassium chloride (KLOR-CON M) 10 MEQ tablet Take 1 tablet (10 mEq total) by mouth daily for 3 days. 01/26/22 01/29/22  Regalado, Cassie Freer, MD  ?   ? ?Allergies    ?Cefepime, Contrast media [iodinated contrast media], Iodine, Other, Ampicillin, Avocado, Levofloxacin, Pantoprazole, Septra [bactrim], Sulfamethoxazole-trimethoprim, Vancomycin, and Colestipol hcl   ? ?Review of Systems   ?Review of Systems  ?Constitutional:  Positive for fever. Negative for chills, diaphoresis and fatigue.  ?HENT:  Negative for congestion, rhinorrhea and sneezing.   ?Eyes: Negative.   ?Respiratory:  Negative for cough, chest tightness and shortness of breath.   ?Cardiovascular:  Negative for  chest pain and leg swelling.  ?Gastrointestinal:  Positive for abdominal pain. Negative for blood in stool, diarrhea, nausea and vomiting.  ?Genitourinary:  Negative for difficulty urinating, flank pain, frequency and hematuria.  ?Musculoskeletal:  Negative for arthralgias and back pain.  ?Skin:  Negative for rash.  ?Neurological:  Negative for dizziness, speech difficulty, weakness, numbness and headaches.  ? ?Physical Exam ?Updated Vital Signs ?BP 108/65   Pulse 75   Temp 99.1 ?F (37.3 ?C) (Oral)   Resp 17   Ht '5\' 4"'$  (1.626 m)   Wt 68 kg    SpO2 100%   BMI 25.75 kg/m?  ?Physical Exam ?Constitutional:   ?   Appearance: She is well-developed.  ?HENT:  ?   Head: Normocephalic and atraumatic.  ?Eyes:  ?   Pupils: Pupils are equal, round, and reactive to light.  ?Cardiovascular:  ?   Rate and Rhythm: Normal rate and regular rhythm.  ?   Heart sounds: Normal heart sounds.  ?Pulmonary:  ?   Effort: Pulmonary effort is normal. No respiratory distress.  ?   Breath sounds: Normal breath sounds. No wheezing or rales.  ?Chest:  ?   Chest wall: No tenderness.  ?Abdominal:  ?   General: Bowel sounds are normal.  ?   Palpations: Abdomen is soft.  ?   Tenderness: There is no abdominal tenderness (Right upper quadrant). There is no guarding or rebound.  ?Musculoskeletal:     ?   General: Normal range of motion.  ?   Cervical back: Normal range of motion and neck supple.  ?Lymphadenopathy:  ?   Cervical: No cervical adenopathy.  ?Skin: ?   General: Skin is warm and dry.  ?   Findings: No rash.  ?Neurological:  ?   Mental Status: She is alert and oriented to person, place, and time.  ? ? ?ED Results / Procedures / Treatments   ?Labs ?(all labs ordered are listed, but only abnormal results are displayed) ?Labs Reviewed  ?COMPREHENSIVE METABOLIC PANEL - Abnormal; Notable for the following components:  ?    Result Value  ? Glucose, Bld 104 (*)   ? Calcium 8.5 (*)   ? Albumin 3.1 (*)   ? All other components within normal limits  ?CBC WITH DIFFERENTIAL/PLATELET - Abnormal; Notable for the following components:  ? WBC 19.7 (*)   ? Platelets 430 (*)   ? Neutro Abs 15.7 (*)   ? Abs Immature Granulocytes 0.65 (*)   ? All other components within normal limits  ?LIPASE, BLOOD - Abnormal; Notable for the following components:  ? Lipase 68 (*)   ? All other components within normal limits  ?URINALYSIS, ROUTINE W REFLEX MICROSCOPIC - Abnormal; Notable for the following components:  ? APPearance HAZY (*)   ? All other components within normal limits  ?RESP PANEL BY RT-PCR (FLU A&B,  COVID) ARPGX2  ? ? ?EKG ?None ? ?Radiology ?CT Abdomen Pelvis Wo Contrast ? ?Result Date: 01/27/2022 ?CLINICAL DATA:  Abdominal pain. Right-sided retroperitoneal inflammatory process after ERCP with persistent fever. Status post recent right thoracentesis. EXAM: CT ABDOMEN AND PELVIS WITHOUT CONTRAST TECHNIQUE: Multidetector CT imaging of the abdomen and pelvis was performed following the standard protocol without IV contrast. RADIATION DOSE REDUCTION: This exam was performed according to the departmental dose-optimization program which includes automated exposure control, adjustment of the mA and/or kV according to patient size and/or use of iterative reconstruction technique. COMPARISON:  CT abdomen pelvis dated 01/24/2022. FINDINGS: Evaluation of this exam is  limited in the absence of intravenous contrast. Lower chest: Small bilateral pleural effusions similar to prior CT. There is associated partial compressive atelectasis of the lower lobes. No intra-abdominal free air. Hepatobiliary: The liver is unremarkable. Cholecystectomy. Similar pneumobilia. Slight interval decrease in the subhepatic fluid collection compared to prior CT. This collection extends down along the right paracolic gutter similar to prior CT. Pancreas: Unremarkable. No pancreatic ductal dilatation or surrounding inflammatory changes. Spleen: Normal in size without focal abnormality. Adrenals/Urinary Tract: The left adrenal glands unremarkable. The right adrenal gland is not visualized. The left kidney is unremarkable. Punctate nonobstructing stone in the upper pole of the right kidney. No hydronephrosis. Similar appearance of right perinephric fluid collection as the prior CT. This may be related to prior hematoma. The urinary bladder is unremarkable. Stomach/Bowel: No bowel obstruction or active inflammation. The appendix is normal. Vascular/Lymphatic: IVC are unremarkable on this noncontrast CT. No portal venous gas. There is no adenopathy.  Reproductive: The uterus and ovaries are grossly unremarkable. No adnexal masses. Other: None Musculoskeletal: Mild degenerative changes of the spine. No acute osseous pathology. IMPRESSION: 1. No significant in

## 2022-01-27 NOTE — H&P (Signed)
?History and Physical  ? ?Laurie Terry JME:[id number] DOB: [birth year] DOA: 01/27/2022 ? ?PCP: [name], MD  ? ?Patient coming from: Home ? ?Chief Complaint: Fever, pain ? ?HPI: Laurie Terry is a 56 y.o. female with medical history significant of proximal SVT, GERD, pancreatitis status post laparoscopic cholecystectomy, thoracic aortic aneurysm, hyperlipidemia, pleural effusions, posterior CP microperforation with fluid collection presenting with fever, rib pain, diarrhea. ? ?Patient presenting with repeat fever at home greater than 101 with increasing right upper quadrant pain despite antibiotics. ? ?She was recently admitted from April 25 until May 6 for post ERCP microperforation with fluid collection at the gallbladder fossa, noted to have fevers and largely stable white count although was on antibiotics during admission thought process was this was secondary to inflammatory reaction secondary to this microperforation leading to this fluid in the fossa as well as reactive pleural effusions (treated with above antibiotics and thoracentesis). ? ?She was covered with antibiotics during admission had reaction to cefepime and oral vancomycin but responded well to ceftriaxone and Flagyl.  She had remained fever free for couple days prior to discharge and was transition to Augmentin for 7-day course on discharge.  She has been taking his medicine for just a day and today developed a fever of 101.5.  Also with increasing right upper quadrant pain as above. ? ?In addition to fever abdominal pain she reports some continued diarrhea. She denies chills, chest pain, shortness of breath, constipation, nausea, vomiting. ? ?ED Course: Vital signs in the ED significant for blood pressure in the 22L to 798 systolic which appears to be near her baseline.  Lab work-up included CMP with glucose 104, calcium 8.5, albumin 3.1.  CBC significant for leukocytosis to 19.7 and platelets of 430.  Lipase mildly elevated at 68  and respiratory panel for flu COVID-negative.  Urinalysis within normal limits.  Rib and chest x-ray showing no acute bony abnormality and stable small effusions.  CT of the abdomen pelvis showed stable fluid collection in the abdomen, stable bilateral effusions, nonobstructing renal stones, no evidence of inflammation at the pancreas.  Patient received dose of Tylenol in the ED and ceftriaxone and Flagyl have been ordered as well. ? ?Review of Systems: As per HPI otherwise all other systems reviewed and are negative. ? ?Past Medical History:  ?Diagnosis Date  ? Allergic rhinitis   ? Aortic aneurysm ([place]) 09/23/2020  ? Cancer [facility])   ? Melanoma  ? Cervical dysplasia 2007  ? low-grade SIL, normal Pap smears since then  ? Family history of adverse reaction to anesthesia   ? mom has h/o post op nausea/vomiting  ? Intermittent palpitations   ? IUD   ? inserted 03/2002.04/06/2007, 06/03/2012  ? Ocular migraine   ? Osteomalacia   ? Pancreatitis   ? Seafood allergy   ? ? ?Past Surgical History:  ?Procedure Laterality Date  ? BIOPSY  01/21/2022  ? Procedure: BIOPSY;  Surgeon: Wilford Corner, MD;  Location: [facility];  Service: Gastroenterology;;  ? Lillard Anes  2018  ? CHOLECYSTECTOMY N/A 04/09/2016  ? Procedure: LAPAROSCOPIC CHOLECYSTECTOMY ;  Surgeon: Rolm Bookbinder, MD;  Location: [place];  Service: General;  Laterality: N/A;  ? ERCP  04/11/2016  ? ERCP with pancreatic stent and failed CBD stent after needle knife and conventional sphincterotomy  ? ERCP N/A 04/12/2016  ? Procedure: ENDOSCOPIC RETROGRADE CHOLANGIOPANCREATOGRAPHY (ERCP);  Surgeon: Clarene Essex, MD;  Location: [facility];  Service: Endoscopy;  Laterality: N/A;  ? ERCP N/A 01/15/2022  ? Procedure:  ENDOSCOPIC RETROGRADE CHOLANGIOPANCREATOGRAPHY (ERCP);  Surgeon: Clarene Essex, MD;  Location: [facility];  Service: Gastroenterology;  Laterality: N/A;  ? FLEXIBLE SIGMOIDOSCOPY N/A 01/21/2022  ? Procedure: FLEXIBLE SIGMOIDOSCOPY;  Surgeon: Wilford Corner, MD;   Location: [facility];  Service: Gastroenterology;  Laterality: N/A;  ? FOOT SURGERY    ? toenail  ? INTRAUTERINE DEVICE INSERTION  08/07/2017  ? Mirena  ? removal of melanoma    ? REMOVAL OF STONES  01/15/2022  ? Procedure: REMOVAL OF STONES;  Surgeon: Clarene Essex, MD;  Location: [facility];  Service: Gastroenterology;;  ? [name]  01/15/2022  ? Procedure: SPHINCTEROTOMY;  Surgeon: Clarene Essex, MD;  Location: [facility];  Service: Gastroenterology;;  ? ? ?Social History ? reports that she has quit smoking. She has never used smokeless tobacco. She reports that she does not currently use alcohol. She reports that she does not use drugs. ? ?Allergies  ?Allergen Reactions  ? Cefepime Cough  ?  Patient develops cough, rash in her ears.   ? Contrast Media [Iodinated Contrast Media] Shortness Of Breath  ?  Low blood pressure   ? Iodine Shortness Of Breath and Swelling  ? Other Hives and Swelling  ?  Seafood  ? Ampicillin Hives  ?  Childhood allergy  ? Avocado Itching  ?  Tongue and mouth  ? Levofloxacin Hives  ?  Pt already has aortic aneurysm.  ? Pantoprazole Other (See Comments)  ?  Joint aches, fever, peeling of skin  ? Septra [Bactrim] Hives  ? Sulfamethoxazole-Trimethoprim Other (See Comments) and Hives  ? Vancomycin Cough  ?  Patient develops cough, rash ears, tingling lips after receiving Cefepime and vancomycin 01/23/2022  ? Colestipol Hcl Rash  ? ? ?Family History  ?Problem Relation Age of Onset  ? Tremor Mother   ? Stroke Father   ? Autoimmune disease Father   ? CVA Father   ? Breast cancer Sister 64  ? Arthritis Sister   ?     Psoriatic  ? Thyroid disease Sister   ? Breast cancer Maternal Grandmother   ?     Age 57  ? Thyroid disease Daughter   ?     Hypo  ?Reviewed on admission ? ?Prior to Admission medications   ?Medication Sig Start Date End Date Taking? Authorizing Provider  ?acetaminophen (TYLENOL) 500 MG tablet Take 1,000 mg by mouth every 6 (six) hours as needed for mild pain or fever.     [provider]  ?albuterol (PROAIR HFA) 108 (90 Base) MCG/ACT inhaler Inhale 2 puffs into the lungs every 4 (four) hours as needed for wheezing or shortness of breath (coughing fits). 05/23/21   Garnet Sierras, DO  ?amoxicillin-clavulanate (AUGMENTIN) 875-125 MG tablet Take 1 tablet by mouth every 12 (twelve) hours for 6 days. 01/26/22 02/01/22  Regalado, Belkys A, MD  ?cholestyramine light (PREVALITE) 4 g packet Take 1 packet (4 g total) by mouth 2 (two) times daily. 01/26/22   Regalado, Belkys A, MD  ?eletriptan (RELPAX) 40 MG tablet 40 mg every 2 (two) hours as needed for migraine. 05/18/20   [provider]  ?EPINEPHrine 0.3 mg/0.3 mL IJ SOAJ injection Inject 0.3 mg into the muscle as needed for anaphylaxis.    [provider]  ?famotidine (PEPCID) 20 MG tablet Take 20 mg by mouth daily as needed for heartburn or indigestion.    [provider]  ?fluticasone (FLONASE) 50 MCG/ACT nasal spray 2 sprays 2 (two) times daily as needed for allergies. 07/14/20  [provider]  ?ketotifen (ZADITOR) 0.025 % ophthalmic solution 1 drop daily as needed (Itching eye and allergies).    [provider]  ?metoprolol tartrate (LOPRESSOR) 25 MG tablet Take 1 tablet (25 mg total) by mouth daily as needed (svt). 01/26/22   Regalado, Belkys A, MD  ?potassium chloride (KLOR-CON M) 10 MEQ tablet Take 1 tablet (10 mEq total) by mouth daily for 3 days. 01/26/22 01/29/22  Regalado, Cassie Freer, MD  ? ? ?Physical Exam: ?Vitals:  ? 01/27/22 1702 01/27/22 1710 01/27/22 2150 01/27/22 2302  ?BP: (!) 119/52  108/65   ?Pulse: 92  75   ?Resp: 18  17   ?Temp: 98.9 ?F (37.2 ?C)   99.1 ?F (37.3 ?C)  ?TempSrc: Oral   Oral  ?SpO2: 95%  100%   ?Weight:  68 kg    ?Height:  '5\' 4"'$  (1.626 m)    ? ? ?Physical Exam ?Constitutional:   ?   General: She is not in acute distress. ?   Appearance: Normal appearance.  ?HENT:  ?   Head: Normocephalic and atraumatic.  ?   Mouth/Throat:  ?   Mouth: Mucous membranes are moist.  ?    Pharynx: Oropharynx is clear.  ?Eyes:  ?   Extraocular Movements: Extraocular movements intact.  ?   Pupils: Pupils are equal, round, and reactive to light.  ?Cardiovascular:  ?   Rate and Rhythm: Normal

## 2022-01-27 NOTE — ED Triage Notes (Signed)
Pt states she had a fever of 101.5 at home today. Pt recently hospitalized. Pt reports right rib pain today. Pt reports diarrhea today. ?

## 2022-01-27 NOTE — ED Provider Triage Note (Signed)
Emergency Medicine Provider Triage Evaluation Note ? ?Terrace Arabia , a 56 y.o. female  was evaluated in triage.  Patient with a recent admission on April 25 and discharged yesterday on May 6. She has a history of melanoma, paroxysmal SVT, thoracic aortic aneurysm.  She was admitted for further observation after an ERCP and was thought to have acute pancreatitis as well as a possible retroperitoneal perforation.  While in the hospital she developed a fever, diarrhea, as well as an allergic reaction to [facility] that as well as cefepime.  She was discharged yesterday in stable condition.  She states that today the pain that she had been experiencing in her right lower ribs began to worsen and she also began experiencing fevers.  Tmax 101.3 ?F.  She states that while coming to the emergency department her symptoms began to improve and she is currently afebrile.  She denies any use of antipyretics prior to arrival. ? ?Physical Exam  ?BP (!) 119/52 (BP Location: Left Arm)   Pulse 92   Temp 98.9 ?F (37.2 ?C) (Oral)   Resp 18   Ht '5\' 4"'$  (1.626 m)   Wt 68 kg   SpO2 95%   BMI 25.75 kg/m?  ?Gen:   Awake, no distress   ?Resp:  Normal effort  ?MSK:   Moves extremities without difficulty  ?Other:   ? ?Medical Decision Making  ?Medically screening exam initiated at 5:11 PM.  Appropriate orders placed.  Jeweline Reif Levene was informed that the remainder of the evaluation will be completed by another provider, this initial triage assessment does not replace that evaluation, and the importance of remaining in the ED until their evaluation is complete. ?  ?Rayna Sexton, PA-C ?01/27/22 1713 ? ?

## 2022-01-28 ENCOUNTER — Observation Stay (HOSPITAL_COMMUNITY): Payer: 59

## 2022-01-28 ENCOUNTER — Encounter (HOSPITAL_COMMUNITY): Payer: Self-pay | Admitting: Internal Medicine

## 2022-01-28 DIAGNOSIS — R509 Fever, unspecified: Secondary | ICD-10-CM

## 2022-01-28 DIAGNOSIS — E7849 Other hyperlipidemia: Secondary | ICD-10-CM

## 2022-01-28 DIAGNOSIS — B999 Unspecified infectious disease: Secondary | ICD-10-CM | POA: Diagnosis not present

## 2022-01-28 DIAGNOSIS — A419 Sepsis, unspecified organism: Secondary | ICD-10-CM

## 2022-01-28 DIAGNOSIS — K659 Peritonitis, unspecified: Secondary | ICD-10-CM

## 2022-01-28 DIAGNOSIS — J90 Pleural effusion, not elsewhere classified: Secondary | ICD-10-CM | POA: Diagnosis not present

## 2022-01-28 DIAGNOSIS — K8070 Calculus of gallbladder and bile duct without cholecystitis without obstruction: Secondary | ICD-10-CM

## 2022-01-28 LAB — COMPREHENSIVE METABOLIC PANEL
ALT: 20 U/L (ref 0–44)
AST: 18 U/L (ref 15–41)
Albumin: 2.7 g/dL — ABNORMAL LOW (ref 3.5–5.0)
Alkaline Phosphatase: 63 U/L (ref 38–126)
Anion gap: 8 (ref 5–15)
BUN: 6 mg/dL (ref 6–20)
CO2: 26 mmol/L (ref 22–32)
Calcium: 8.3 mg/dL — ABNORMAL LOW (ref 8.9–10.3)
Chloride: 106 mmol/L (ref 98–111)
Creatinine, Ser: 0.46 mg/dL (ref 0.44–1.00)
GFR, Estimated: >60 mL/min (ref >60)
Glucose, Bld: 102 mg/dL — ABNORMAL HIGH (ref 70–99)
Potassium: 3.4 mmol/L — ABNORMAL LOW (ref 3.5–5.1)
Sodium: 140 mmol/L (ref 135–145)
Total Bilirubin: 0.5 mg/dL (ref 0.3–1.2)
Total Protein: 5.8 g/dL — ABNORMAL LOW (ref 6.5–8.1)

## 2022-01-28 LAB — CBC
HCT: 33 % — ABNORMAL LOW (ref 36.0–46.0)
Hemoglobin: 10.5 g/dL — ABNORMAL LOW (ref 12.0–15.0)
MCH: 28.5 pg (ref 26.0–34.0)
MCHC: 31.8 g/dL (ref 30.0–36.0)
MCV: 89.7 fL (ref 80.0–100.0)
Platelets: 463 10*3/uL — ABNORMAL HIGH (ref 150–400)
RBC: 3.68 MIL/uL — ABNORMAL LOW (ref 3.87–5.11)
RDW: 14.6 % (ref 11.5–15.5)
WBC: 19.9 10*3/uL — ABNORMAL HIGH (ref 4.0–10.5)
nRBC: 0 % (ref 0.0–0.2)

## 2022-01-28 LAB — CULTURE, BLOOD (ROUTINE X 2)
Culture: NO GROWTH
Culture: NO GROWTH
Special Requests: ADEQUATE
Special Requests: ADEQUATE

## 2022-01-28 MED ORDER — DICYCLOMINE HCL 10 MG PO CAPS
10.0000 mg | ORAL_CAPSULE | Freq: Once | ORAL | Status: AC
  Administered 2022-01-28: 10 mg via ORAL
  Filled 2022-01-28: qty 1

## 2022-01-28 MED ORDER — DICYCLOMINE HCL 10 MG PO CAPS
10.0000 mg | ORAL_CAPSULE | Freq: Three times a day (TID) | ORAL | Status: DC
  Administered 2022-01-28 – 2022-02-02 (×18): 10 mg via ORAL
  Filled 2022-01-28 (×19): qty 1

## 2022-01-28 MED ORDER — GADOBUTROL 1 MMOL/ML IV SOLN
7.0000 mL | Freq: Once | INTRAVENOUS | Status: AC | PRN
  Administered 2022-01-28: 7 mL via INTRAVENOUS

## 2022-01-28 NOTE — Plan of Care (Signed)
?  Problem: Education: ?Goal: Knowledge of General Education information will improve ?Description: Including pain rating scale, medication(s)/side effects and non-pharmacologic comfort measures ?Outcome: Progressing ?  ?Problem: Clinical Measurements: ?Goal: Ability to maintain clinical measurements within normal limits will improve ?Outcome: Progressing ?Goal: Will remain free from infection ?Outcome: Progressing ?Goal: Diagnostic test results will improve ?Outcome: Progressing ?  ?Problem: Activity: ?Goal: Risk for activity intolerance will decrease ?Outcome: Progressing ?  ?Problem: Nutrition: ?Goal: Adequate nutrition will be maintained ?Outcome: Progressing ?  ?Problem: Coping: ?Goal: Level of anxiety will decrease ?Outcome: Progressing ?  ?Problem: Elimination: ?Goal: Will not experience complications related to bowel motility ?Outcome: Progressing ?Goal: Will not experience complications related to urinary retention ?Outcome: Progressing ?  ?Problem: Pain Managment: ?Goal: General experience of comfort will improve ?Outcome: Progressing ?  ?Problem: Safety: ?Goal: Ability to remain free from injury will improve ?Outcome: Progressing ?  ?

## 2022-01-28 NOTE — ED Notes (Signed)
Patient is ready for transport.  

## 2022-01-28 NOTE — Progress Notes (Signed)
?Progress Note ? ? ?Patient: Laurie Terry YQM:[id number] DOB: 02-11-1966 DOA: 01/27/2022     0 ?DOS: the patient was seen and examined on 01/28/2022 ?  ?Brief hospital course: ?56 y.o. female with medical history significant of proximal SVT, GERD, pancreatitis status post laparoscopic cholecystectomy, thoracic aortic aneurysm, hyperlipidemia, pleural effusions, posterior CP microperforation with fluid collection presenting with fever, rib pain, diarrhea. ? ?Patient presenting with repeat fever at home greater than 101 with increasing right upper quadrant pain despite antibiotics. ?  ?She was recently admitted from April 25 until May 6 for post ERCP microperforation with fluid collection at the gallbladder fossa, noted to have fevers and largely stable white count although was on antibiotics during admission thought process was this was secondary to inflammatory reaction secondary to this microperforation leading to this fluid in the fossa as well as reactive pleural effusions (treated with above antibiotics and thoracentesis). ?  ?She was covered with antibiotics during admission had reaction to cefepime and oral vancomycin but responded well to ceftriaxone and Flagyl.  She had remained fever free for couple days prior to discharge and was transition to Augmentin for 7-day course on discharge.  She has been taking his medicine for just a day and today developed a fever of 101.5.  Also with increasing right upper quadrant pain as above. ? ?Assessment and Plan: ?No notes have been filed under this hospital service. ?Service: Hospitalist ? ?Intra-abdominal infection, presumed ?Post ERCP micro perforation ?Leukocytosis/Fever ?> Patient presenting after discharge home from recent admission as detailed in HPI with recurrent fever of 101.5 and increasing right upper quadrant pain. ?> Recent admit for post ERCP microperforation, with fluid collection in the gallbladder fossa and pleural effusion thought to be reactive  versus infectious.  Covered with antibiotics with good response and fever free for 2 days prior to discharge (note did have reaction to cefepime and/or vancomycin during admission).  She was discharged on p.o. Augmentin and after 1 to 2 days at home has had recurrence of fevers on this regimen. ?> with her fever significant increase in leukocytosis to 19.7 on presentation.  Also reactive thrombocytosis. ?> Continue ceftriaxone and metronidazole ?- blood cultures pending ?- Pt now afebrile and reports symptoms improved ?-ID recs for f/u MRI abd, pending ?-GI following. Recs for abx per ID recs, cont cholestyramine bid, start dicyclomine ?  ?Bilateral pleural effusions ?> Remained stable on imaging in the ED. ?- Have been seen previously by pulmonology and this is all thought to be reactive secondary to above.  Body fluid culture with no growth and no organisms. ?-MRI abd ordered per above ? ?Diarrhea ?> Has continued to have some diarrhea worse in the last couple days since discharge. ?> Negative infectious work-up during recent admission. ?- Continue cholestyramine per above ? ?Paroxysmal SVT ?- Continue as needed metoprolol ?  ? ?  ? ?Subjective: Reports pain improved since starting above abx ? ?Physical Exam: ?Vitals:  ? 01/28/22 0102 01/28/22 0619 01/28/22 0906 01/28/22 1343  ?BP: (!) 100/55 96/60 109/69 98/68  ?Pulse: 76 74 78 80  ?Resp: '20  16 16  '$ ?Temp: 98.5 ?F (36.9 ?C) 98.1 ?F (36.7 ?C) 98.1 ?F (36.7 ?C) 98.9 ?F (37.2 ?C)  ?TempSrc: Oral Oral Oral Oral  ?SpO2: 99% 95% 98% 96%  ?Weight:      ?Height:      ? ?General exam: Awake, laying in bed, in nad ?Respiratory system: Normal respiratory effort, no wheezing ?Cardiovascular system: regular rate, s1, s2 ?Gastrointestinal system: Soft,  nondistended, positive BS ?Central nervous system: CN2-12 grossly intact, strength intact ?Extremities: Perfused, no clubbing ?Skin: Normal skin turgor, no notable skin lesions seen ?Psychiatry: Mood normal // no visual  hallucinations  ? ?Data Reviewed: ? ?Labs reviewed: K 3.4, Cr 0.46, WBC 19.9, hgb 10.5 ? ?Family Communication: Pt in room, family not at bedside ? ?Disposition: ?Status is: Observation ?The patient remains OBS appropriate and will d/c before 2 midnights. ? Planned Discharge Destination: Home ? ? ? ?Author: ?Marylu Lund, MD ?01/28/2022 2:34 PM ? ?For on call review [email or web address].  ?

## 2022-01-28 NOTE — Consult Note (Signed)
[facility]    Date of Admission:  01/27/2022     Reason for Consult: Fevers, antibiotic recs     Referring Physician: Dr Rhona Leavens  Current antibiotics: Ceftriaxone Flagyl  ASSESSMENT:    56 y.o. female admitted with:  Subhepatic space/gallbladder fossa fluid collection: Noted again on CT scan without contrast this admission without significant interval change.  This fluid collection is in the setting of choledocholithiasis status post ERCP 4/25 with removal of stones.  Imaging last week showed no evidence of oral contrast leakage to suggest overt bowel leak specifically at the level of the duodenum. Sepsis: Presenting with fevers and leukocytosis.  Suspected secondary to intra-abdominal etiology. Diarrhea: C. difficile and GI pathogen panel was negative last admission.  This is overall improved. Right greater than left pleural effusion: Status postthoracentesis on 5/3 with negative cultures.  Felt to be reactive from an intra-abdominal process.  Repeat CT scan 5/7 with small bilateral pleural effusions that were stable.  RECOMMENDATIONS:    Continue ceftriaxone and metronidazole. No significant difference in coverage between Augmentin and ceftriaxone/Flagyl which raises concern for unidentified intra-abdominal abscess leading to her readmission that was not identified given lack of contrast on prior imaging.   Recommend contrast-enhanced imaging to evaluate for abscess that may be drainable. Lab monitoring. Will follow.    Principal Problem:   Intra-abdominal infection Active Problems:   Paroxysmal SVT (supraventricular tachycardia) (HCC)   Other hyperlipidemia   GERD without esophagitis   Pleural effusion, bilateral   MEDICATIONS:    Scheduled Meds: . cholestyramine light  4 g Oral BID  . dicyclomine  10 mg Oral TID AC & HS  . enoxaparin (LOVENOX) injection  40 mg Subcutaneous Q24H  . sodium chloride flush  3 mL Intravenous Q12H   Continuous  Infusions: . cefTRIAXone (ROCEPHIN)  IV Stopped (01/28/22 0845)  . metronidazole 500 mg (01/28/22 1339)   PRN Meds:.acetaminophen **OR** acetaminophen, albuterol, EPINEPHrine, famotidine, metoprolol tartrate  HPI:    Laurie Terry is a 56 y.o. female with past medical history as noted below and recent hospitalization who was discharged on Saturday 5/6 and subsequently readmitted the following evening after presenting with recurrent fever, worsening leukocytosis, and right-sided abdominal pain.  Patient underwent ERCP on 4/25 with gastroenterology.  At that time she was found to have choledocholithiasis with complete removal of stones by biliary sphincterotomy and balloon extraction.  The biliary tree was swept at the conclusion of her procedure and nothing was found.  She admitted shortly after the procedure on that date and remained hospitalized through 01/26/2022.  That admission was complicated by persistent fever and concern for an intra-abdominal infection.  Hospitalization was also complicated by diarrhea with negative testing for C. difficile as well as GI pathogen panel.  She also was noted to have a right greater than left pleural effusion during the admission and underwent thoracentesis (cultures negative) that was felt to be likely reactive from an intra-abdominal process.  She underwent CT imaging of the abdomen/pelvis which noted gas and fluid in the gallbladder fossa as well as retroperitoneal fluid inferior to the liver and kidney.  This imaging was suboptimal due to lack of IV contrast in the setting of allergy.  Prior to discharge, she underwent a follow-up CT scan which was stable.  Her blood cultures were negative and she was discharged on Augmentin for 7 more days.  Upon returning to the emergency department last night, she was afebrile but had recorded her temperature  as high as 101.5 F.  This also coincided with worsening right-sided abdominal pain similar in location to the pain  she was experiencing prior to her ERCP.  Her lab work was also significant for worsening leukocytosis of 19.9.  Her LFTs are normal.  She had another CT scan without contrast that showed no significant interval change in the right subhepatic and perinephric fluid collection compared to her prior imaging.  We have been consulted for further recommendations.   Past Medical History:  Diagnosis Date  . Allergic rhinitis   . Aortic aneurysm (HCC) 09/23/2020  . Cancer (HCC)    Melanoma  . Cervical dysplasia 2007   low-grade SIL, normal Pap smears since then  . Family history of adverse reaction to anesthesia    mom has h/o post op nausea/vomiting  . Intermittent palpitations   . IUD    inserted 03/2002.04/06/2007, 06/03/2012  . Ocular migraine   . Osteomalacia   . Pancreatitis   . Seafood allergy     Social History   Tobacco Use  . Smoking status: Former  . Smokeless tobacco: Never  Vaping Use  . Vaping Use: Never used  Substance Use Topics  . Alcohol use: Not Currently    Comment: Rare  . Drug use: No    Family History  Problem Relation Age of Onset  . Tremor Mother   . Stroke Father   . Autoimmune disease Father   . CVA Father   . Breast cancer Sister 18  . Arthritis Sister        Psoriatic  . Thyroid disease Sister   . Breast cancer Maternal Grandmother        Age 49  . Thyroid disease Daughter        Hypo    Allergies  Allergen Reactions  . Cefepime Cough    Patient develops cough, rash in her ears.   . Contrast Media [Iodinated Contrast Media] Shortness Of Breath    Low blood pressure   . Iodine Shortness Of Breath and Swelling  . Other Hives and Swelling    Seafood  . Ampicillin Hives    Childhood allergy  . Avocado Itching    Tongue and mouth  . Levofloxacin Hives    Pt already has aortic aneurysm.  . Pantoprazole Other (See Comments)    Joint aches, fever, peeling of skin  . Septra [Bactrim] Hives  . Sulfamethoxazole-Trimethoprim Other (See  Comments) and Hives  . Vancomycin Cough    Patient develops cough, rash ears, tingling lips after receiving Cefepime and vancomycin 01/23/2022  . Colestipol Hcl Rash    Review of Systems  Constitutional:  Positive for fever.  Respiratory: Negative.    Cardiovascular: Negative.   Gastrointestinal:  Positive for abdominal pain and diarrhea. Negative for nausea and vomiting.  Genitourinary: Negative.   Skin: Negative.   All other systems reviewed and are negative.  OBJECTIVE:   Blood pressure 98/68, pulse 80, temperature 98.9 F (37.2 C), temperature source Oral, resp. rate 16, height 5\' 4"  (1.626 m), weight 68 kg, SpO2 96 %. Body mass index is 25.75 kg/m.  Physical Exam Constitutional:      General: She is not in acute distress.    Appearance: Normal appearance.  HENT:     Head: Normocephalic and atraumatic.     Nose: Nose normal.     Mouth/Throat:     Mouth: Mucous membranes are moist.     Pharynx: Oropharynx is clear.  Eyes:  General: No scleral icterus.    Extraocular Movements: Extraocular movements intact.     Conjunctiva/sclera: Conjunctivae normal.  Pulmonary:     Effort: Pulmonary effort is normal. No respiratory distress.  Abdominal:     General: There is no distension.     Palpations: Abdomen is soft.  Musculoskeletal:        General: Normal range of motion.     Cervical back: Normal range of motion and neck supple.  Skin:    General: Skin is warm and dry.     Coloration: Skin is not jaundiced.  Neurological:     General: No focal deficit present.     Mental Status: She is alert and oriented to person, place, and time.  Psychiatric:        Mood and Affect: Mood normal.        Behavior: Behavior normal.     Lab Results: Lab Results  Component Value Date   WBC 19.9 (H) 01/28/2022   HGB 10.5 (L) 01/28/2022   HCT 33.0 (L) 01/28/2022   MCV 89.7 01/28/2022   PLT 463 (H) 01/28/2022    Lab Results  Component Value Date   NA 140 01/28/2022   K 3.4  (L) 01/28/2022   CO2 26 01/28/2022   GLUCOSE 102 (H) 01/28/2022   BUN 6 01/28/2022   CREATININE 0.46 01/28/2022   CALCIUM 8.3 (L) 01/28/2022   GFRNONAA >60 01/28/2022   GFRAA >60 04/17/2016    Lab Results  Component Value Date   ALT 20 01/28/2022   AST 18 01/28/2022   ALKPHOS 63 01/28/2022   BILITOT 0.5 01/28/2022    No results found for: CRP     Component Value Date/Time   ESRSEDRATE 2 11/28/2020 1000    I have reviewed the micro and lab results in Epic.  Imaging: CT Abdomen Pelvis Wo Contrast  Result Date: 01/27/2022 CLINICAL DATA:  Abdominal pain. Right-sided retroperitoneal inflammatory process after ERCP with persistent fever. Status post recent right thoracentesis. EXAM: CT ABDOMEN AND PELVIS WITHOUT CONTRAST TECHNIQUE: Multidetector CT imaging of the abdomen and pelvis was performed following the standard protocol without IV contrast. RADIATION DOSE REDUCTION: This exam was performed according to the departmental dose-optimization program which includes automated exposure control, adjustment of the mA and/or kV according to patient size and/or use of iterative reconstruction technique. COMPARISON:  CT abdomen pelvis dated 01/24/2022. FINDINGS: Evaluation of this exam is limited in the absence of intravenous contrast. Lower chest: Small bilateral pleural effusions similar to prior CT. There is associated partial compressive atelectasis of the lower lobes. No intra-abdominal free air. Hepatobiliary: The liver is unremarkable. Cholecystectomy. Similar pneumobilia. Slight interval decrease in the subhepatic fluid collection compared to prior CT. This collection extends down along the right paracolic gutter similar to prior CT. Pancreas: Unremarkable. No pancreatic ductal dilatation or surrounding inflammatory changes. Spleen: Normal in size without focal abnormality. Adrenals/Urinary Tract: The left adrenal glands unremarkable. The right adrenal gland is not visualized. The left kidney  is unremarkable. Punctate nonobstructing stone in the upper pole of the right kidney. No hydronephrosis. Similar appearance of right perinephric fluid collection as the prior CT. This may be related to prior hematoma. The urinary bladder is unremarkable. Stomach/Bowel: No bowel obstruction or active inflammation. The appendix is normal. Vascular/Lymphatic: IVC are unremarkable on this noncontrast CT. No portal venous gas. There is no adenopathy. Reproductive: The uterus and ovaries are grossly unremarkable. No adnexal masses. Other: None Musculoskeletal: Mild degenerative changes of the spine. No acute  osseous pathology. IMPRESSION: 1. No significant interval change in the right subhepatic and perinephric fluid collection compared to prior CT. 2. Small bilateral pleural effusions similar to prior CT.  A 3. Punctate nonobstructing right renal upper pole stone. No hydronephrosis. 4. No bowel obstruction. Normal appendix. Electronically Signed   By: Elgie Collard M.D.   On: 01/27/2022 22:42   DG Ribs Unilateral W/Chest Right  Result Date: 01/27/2022 CLINICAL DATA:  History of recent discharge from treatment of perforation from ERCP, presenting with fever and lower right rib tenderness. EXAM: RIGHT RIBS AND CHEST - 3+ VIEW COMPARISON:  Jan 24, 2022 FINDINGS: A radiopaque marker was placed at the site of the patient's pain. No fracture or other bone lesions are seen involving the ribs. There is no evidence of pneumothorax or pleural effusion. There is mild elevation of the right hemidiaphragm. Small, predominantly stable bilateral pleural effusions are seen. There is no evidence of an acute infiltrate or pneumothorax. Heart size and mediastinal contours are within normal limits. Radiopaque surgical clips are seen within the right upper quadrant. IMPRESSION: 1. No acute osseous abnormality. 2. Small, predominantly stable bilateral pleural effusions. Electronically Signed   By: Aram Candela M.D.   On: 01/27/2022  17:51     Imaging independently reviewed in Epic.  Vedia Coffer for Infectious Disease [facility] [phone] pager 01/28/2022, 1:44 PM

## 2022-01-28 NOTE — Hospital Course (Signed)
56 y.o. female with medical history significant of proximal SVT, GERD, pancreatitis status post laparoscopic cholecystectomy, thoracic aortic aneurysm, hyperlipidemia, pleural effusions, posterior CP microperforation with fluid collection presenting with fever, rib pain, diarrhea. ? ?Patient presenting with repeat fever at home greater than 101 with increasing right upper quadrant pain despite antibiotics. ?  ?She was recently admitted from April 25 until May 6 for post ERCP microperforation with fluid collection at the gallbladder fossa, noted to have fevers and largely stable white count although was on antibiotics during admission thought process was this was secondary to inflammatory reaction secondary to this microperforation leading to this fluid in the fossa as well as reactive pleural effusions (treated with above antibiotics and thoracentesis). ?  ?She was covered with antibiotics during admission had reaction to cefepime and oral vancomycin but responded well to ceftriaxone and Flagyl.  She had remained fever free for couple days prior to discharge and was transition to Augmentin for 7-day course on discharge.  She has been taking his medicine for just a day and today developed a fever of 101.5.  Also with increasing right upper quadrant pain as above. ?

## 2022-01-28 NOTE — Consult Note (Signed)
Referring Provider: TRH ?Primary Care Physician:  [name], MD ?Primary Gastroenterologist:  [name] ? ?Reason for Consultation:  Intra-abdominal infection ? ?HPI: Laurie Terry is a 56 y.o. female with medical history significant of proximal SVT, GERD, pancreatitis status post laparoscopic cholecystectomy, thoracic aortic aneurysm, hyperlipidemia, pleural effusions, posterior CP microperforation with fluid collection presented to the ED yesterday with fever, rib pain, diarrhea. ? ?Was recently admitted from April 25 to May 6 for post ERCP microperforation with fluid collection at the gallbladder fossa.  During hospitalization was treated with Ceftriaxone and Flagyl after adverse reaction to oral vancomycin and cefepime. She developed diarrhea after starting antibiotics which was ultimately managed with cholestyramine.  She remained fever free with stable white count several days prior to discharge.  She also had improving right upper quadrant and epigastric pain.  ? ?At discharge she was sent home with 7 days of Augmentin.  She took 1 day of Augmentin at home.  Yesterday while she was sitting she began to feel ill.  She took her temperature at 100.5.  She retook her temperature again one hour later and was noted to be 101.7.  She also began to have worsening right upper quadrant pain again at which point she decided to return to the ED. ? ?Today after restarting IV antibiotics patient notes she is feeling much better, has no further fevers and her abdominal pain is much improved. ? ?She does note she continues to have some stomach cramping/spasms every time she eats or drinks and when taking her cholestyramine powder.  She notes it feels like her stomach is moving.  It is affecting her appetite she feels she has not been able to eat anything but very bland foods.  ? ?Notes she continues to have some loose stools.  Denies hematochezia, melena, constipation, nausea, vomiting.  ? ?Past Medical History:   ?Diagnosis Date  ? Allergic rhinitis   ? Aortic aneurysm ([place]) 09/23/2020  ? Cancer [facility])   ? Melanoma  ? Cervical dysplasia 2007  ? low-grade SIL, normal Pap smears since then  ? Family history of adverse reaction to anesthesia   ? mom has h/o post op nausea/vomiting  ? Intermittent palpitations   ? IUD   ? inserted 03/2002.04/06/2007, 06/03/2012  ? Ocular migraine   ? Osteomalacia   ? Pancreatitis   ? Seafood allergy   ? ? ?Past Surgical History:  ?Procedure Laterality Date  ? BIOPSY  01/21/2022  ? Procedure: BIOPSY;  Surgeon: Wilford Corner, MD;  Location: [facility];  Service: Gastroenterology;;  ? Lillard Anes  2018  ? CHOLECYSTECTOMY N/A 04/09/2016  ? Procedure: LAPAROSCOPIC CHOLECYSTECTOMY ;  Surgeon: Rolm Bookbinder, MD;  Location: [place];  Service: General;  Laterality: N/A;  ? ERCP  04/11/2016  ? ERCP with pancreatic stent and failed CBD stent after needle knife and conventional sphincterotomy  ? ERCP N/A 04/12/2016  ? Procedure: ENDOSCOPIC RETROGRADE CHOLANGIOPANCREATOGRAPHY (ERCP);  Surgeon: Clarene Essex, MD;  Location: [facility];  Service: Endoscopy;  Laterality: N/A;  ? ERCP N/A 01/15/2022  ? Procedure: ENDOSCOPIC RETROGRADE CHOLANGIOPANCREATOGRAPHY (ERCP);  Surgeon: Clarene Essex, MD;  Location: [facility];  Service: Gastroenterology;  Laterality: N/A;  ? FLEXIBLE SIGMOIDOSCOPY N/A 01/21/2022  ? Procedure: FLEXIBLE SIGMOIDOSCOPY;  Surgeon: Wilford Corner, MD;  Location: [facility];  Service: Gastroenterology;  Laterality: N/A;  ? FOOT SURGERY    ? toenail  ? INTRAUTERINE DEVICE INSERTION  08/07/2017  ? Mirena  ? removal of melanoma    ? REMOVAL OF STONES  01/15/2022  ? Procedure: REMOVAL OF STONES;  Surgeon: Clarene Essex, MD;  Location: [facility];  Service: Gastroenterology;;  ? [name]  01/15/2022  ? Procedure: SPHINCTEROTOMY;  Surgeon: Clarene Essex, MD;  Location: [facility];  Service: Gastroenterology;;  ? ? ?Prior to Admission medications   ?Medication Sig Start Date End Date Taking?  Authorizing Provider  ?acetaminophen (TYLENOL) 500 MG tablet Take 1,000 mg by mouth every 6 (six) hours as needed for mild pain or fever.    [provider]  ?albuterol (PROAIR HFA) 108 (90 Base) MCG/ACT inhaler Inhale 2 puffs into the lungs every 4 (four) hours as needed for wheezing or shortness of breath (coughing fits). 05/23/21   Garnet Sierras, DO  ?amoxicillin-clavulanate (AUGMENTIN) 875-125 MG tablet Take 1 tablet by mouth every 12 (twelve) hours for 6 days. 01/26/22 02/01/22  Regalado, Belkys A, MD  ?cholestyramine light (PREVALITE) 4 g packet Take 1 packet (4 g total) by mouth 2 (two) times daily. 01/26/22   Regalado, Belkys A, MD  ?eletriptan (RELPAX) 40 MG tablet 40 mg every 2 (two) hours as needed for migraine. 05/18/20   [provider]  ?EPINEPHrine 0.3 mg/0.3 mL IJ SOAJ injection Inject 0.3 mg into the muscle as needed for anaphylaxis.    [provider]  ?famotidine (PEPCID) 20 MG tablet Take 20 mg by mouth daily as needed for heartburn or indigestion.    [provider]  ?fluticasone (FLONASE) 50 MCG/ACT nasal spray 2 sprays 2 (two) times daily as needed for allergies. 07/14/20   [provider]  ?ketotifen (ZADITOR) 0.025 % ophthalmic solution 1 drop daily as needed (Itching eye and allergies).    [provider]  ?metoprolol tartrate (LOPRESSOR) 25 MG tablet Take 1 tablet (25 mg total) by mouth daily as needed (svt). 01/26/22   Regalado, Belkys A, MD  ?potassium chloride (KLOR-CON M) 10 MEQ tablet Take 1 tablet (10 mEq total) by mouth daily for 3 days. 01/26/22 01/29/22  Regalado, Jerald Kief A, MD  ? ? ?Scheduled Meds: ? cholestyramine light  4 g Oral BID  ? enoxaparin (LOVENOX) injection  40 mg Subcutaneous Q24H  ? sodium chloride flush  3 mL Intravenous Q12H  ? ?Continuous Infusions: ? cefTRIAXone (ROCEPHIN)  IV 2 g (01/28/22 0031)  ? metronidazole 500 mg (01/28/22 0109)  ? ?PRN Meds:.acetaminophen **OR** acetaminophen, albuterol, EPINEPHrine, famotidine,  metoprolol tartrate ? ?Allergies as of 01/27/2022 - Review Complete 01/27/2022  ?Allergen Reaction Noted  ? Cefepime Cough 01/24/2022  ? Contrast media [iodinated contrast media] Shortness Of Breath 04/12/2016  ? Iodine Shortness Of Breath and Swelling 03/06/2011  ? Other Hives and Swelling 10/17/2013  ? Ampicillin Hives 03/06/2011  ? Avocado Itching 09/28/2020  ? Levofloxacin Hives 03/06/2011  ? Pantoprazole Other (See Comments) 05/17/2021  ? Septra [bactrim] Hives 03/06/2011  ? Sulfamethoxazole-trimethoprim Other (See Comments) and Hives 03/06/2011  ? Vancomycin Cough 01/24/2022  ? Colestipol hcl Rash 01/21/2022  ? ? ?Family History  ?Problem Relation Age of Onset  ? Tremor Mother   ? Stroke Father   ? Autoimmune disease Father   ? CVA Father   ? Breast cancer Sister 7  ? Arthritis Sister   ?     Psoriatic  ? Thyroid disease Sister   ? Breast cancer Maternal Grandmother   ?     Age 45  ? Thyroid disease Daughter   ?     Hypo  ? ? ?Social History  ? ?Socioeconomic History  ? Marital status: Married  ?  Spouse name: Not on file  ? Number of children: Not on file  ? Years of education: Not on file  ? Highest education level: Not on file  ?Occupational History  ? Not on file  ?Tobacco Use  ? Smoking status: Former  ? Smokeless tobacco: Never  ?Vaping Use  ? Vaping Use: Never used  ?Substance and Sexual Activity  ? Alcohol use: Not Currently  ?  Comment: Rare  ? Drug use: No  ? Sexual activity: Yes  ?  Birth control/protection: Post-menopausal  ?  Comment:  declined sexual hx questions,des neg  ?Other Topics Concern  ? Not on file  ?Social History Narrative  ? Not on file  ? ?Social Determinants of Health  ? ?Financial Resource Strain: Not on file  ?Food Insecurity: Not on file  ?Transportation Needs: Not on file  ?Physical Activity: Not on file  ?Stress: Not on file  ?Social Connections: Not on file  ?Intimate Partner Violence: Not on file  ? ? ?Review of Systems: Review of Systems  ?Constitutional:  Positive for  chills, fever and malaise/fatigue.  ?HENT:  Negative for hearing loss and tinnitus.   ?Eyes:  Negative for blurred vision and double vision.  ?Respiratory:  Negative for cough and hemoptysis.   ?Cardiovascular:

## 2022-01-28 NOTE — TOC Progression Note (Signed)
Transition of Care (TOC) - Progression Note  ? ? ?Patient Details  ?Name: Laurie Terry ?MRN: [id number] ?Date of Birth: 22-Apr-1966 ? ?Transition of Care (TOC) CM/SW Contact  ?Purcell Mouton, RN ?Phone Number: ?01/28/2022, 10:33 AM ? ?Clinical Narrative:    ? ? ?Transition of Care (TOC) Screening Note ? ? ?Patient Details  ?Name: Laurie Terry ?Date of Birth: 03/23/1966 ? ? ?Transition of Care (TOC) CM/SW Contact:    ?Purcell Mouton, RN ?Phone Number: ?01/28/2022, 10:33 AM ? ? ? ?Transition of Care Department [facility]) has reviewed patient and no TOC needs have been identified at this time. We will continue to monitor patient advancement through interdisciplinary progression rounds. If new patient transition needs arise, please place a TOC consult. ?  ? ?  ?  ? ?Expected Discharge Plan and Services ?  ?  ?  ?  ?  ?                ?  ?  ?  ?  ?  ?  ?  ?  ?  ?  ? ? ?Social Determinants of Health (SDOH) Interventions ?  ? ?Readmission Risk Interventions ?   ? View : No data to display.  ?  ?  ?  ? ? ?

## 2022-01-29 DIAGNOSIS — R509 Fever, unspecified: Secondary | ICD-10-CM | POA: Diagnosis not present

## 2022-01-29 DIAGNOSIS — Z888 Allergy status to other drugs, medicaments and biological substances status: Secondary | ICD-10-CM | POA: Diagnosis not present

## 2022-01-29 DIAGNOSIS — T361X5A Adverse effect of cephalosporins and other beta-lactam antibiotics, initial encounter: Secondary | ICD-10-CM | POA: Diagnosis not present

## 2022-01-29 DIAGNOSIS — I471 Supraventricular tachycardia: Secondary | ICD-10-CM | POA: Diagnosis present

## 2022-01-29 DIAGNOSIS — Z803 Family history of malignant neoplasm of breast: Secondary | ICD-10-CM | POA: Diagnosis not present

## 2022-01-29 DIAGNOSIS — B999 Unspecified infectious disease: Secondary | ICD-10-CM | POA: Diagnosis present

## 2022-01-29 DIAGNOSIS — I712 Thoracic aortic aneurysm, without rupture, unspecified: Secondary | ICD-10-CM | POA: Diagnosis present

## 2022-01-29 DIAGNOSIS — Z8261 Family history of arthritis: Secondary | ICD-10-CM | POA: Diagnosis not present

## 2022-01-29 DIAGNOSIS — K750 Abscess of liver: Secondary | ICD-10-CM

## 2022-01-29 DIAGNOSIS — N151 Renal and perinephric abscess: Secondary | ICD-10-CM | POA: Diagnosis present

## 2022-01-29 DIAGNOSIS — Z823 Family history of stroke: Secondary | ICD-10-CM | POA: Diagnosis not present

## 2022-01-29 DIAGNOSIS — R197 Diarrhea, unspecified: Secondary | ICD-10-CM

## 2022-01-29 DIAGNOSIS — J9811 Atelectasis: Secondary | ICD-10-CM | POA: Diagnosis present

## 2022-01-29 DIAGNOSIS — Z881 Allergy status to other antibiotic agents status: Secondary | ICD-10-CM | POA: Diagnosis not present

## 2022-01-29 DIAGNOSIS — Z9049 Acquired absence of other specified parts of digestive tract: Secondary | ICD-10-CM | POA: Diagnosis not present

## 2022-01-29 DIAGNOSIS — D75838 Other thrombocytosis: Secondary | ICD-10-CM | POA: Diagnosis present

## 2022-01-29 DIAGNOSIS — Z20822 Contact with and (suspected) exposure to covid-19: Secondary | ICD-10-CM | POA: Diagnosis present

## 2022-01-29 DIAGNOSIS — B952 Enterococcus as the cause of diseases classified elsewhere: Secondary | ICD-10-CM | POA: Diagnosis present

## 2022-01-29 DIAGNOSIS — A419 Sepsis, unspecified organism: Secondary | ICD-10-CM | POA: Diagnosis not present

## 2022-01-29 DIAGNOSIS — J90 Pleural effusion, not elsewhere classified: Secondary | ICD-10-CM | POA: Diagnosis present

## 2022-01-29 DIAGNOSIS — K219 Gastro-esophageal reflux disease without esophagitis: Secondary | ICD-10-CM | POA: Diagnosis present

## 2022-01-29 DIAGNOSIS — Z85820 Personal history of malignant melanoma of skin: Secondary | ICD-10-CM | POA: Diagnosis not present

## 2022-01-29 DIAGNOSIS — Y838 Other surgical procedures as the cause of abnormal reaction of the patient, or of later complication, without mention of misadventure at the time of the procedure: Secondary | ICD-10-CM | POA: Diagnosis present

## 2022-01-29 DIAGNOSIS — K659 Peritonitis, unspecified: Secondary | ICD-10-CM | POA: Diagnosis not present

## 2022-01-29 DIAGNOSIS — K651 Peritoneal abscess: Secondary | ICD-10-CM | POA: Diagnosis not present

## 2022-01-29 DIAGNOSIS — T8143XA Infection following a procedure, organ and space surgical site, initial encounter: Secondary | ICD-10-CM | POA: Diagnosis present

## 2022-01-29 DIAGNOSIS — K6811 Postprocedural retroperitoneal abscess: Secondary | ICD-10-CM | POA: Diagnosis present

## 2022-01-29 DIAGNOSIS — Z91041 Radiographic dye allergy status: Secondary | ICD-10-CM | POA: Diagnosis not present

## 2022-01-29 DIAGNOSIS — R059 Cough, unspecified: Secondary | ICD-10-CM | POA: Diagnosis present

## 2022-01-29 DIAGNOSIS — E7849 Other hyperlipidemia: Secondary | ICD-10-CM | POA: Diagnosis present

## 2022-01-29 DIAGNOSIS — Y92239 Unspecified place in hospital as the place of occurrence of the external cause: Secondary | ICD-10-CM | POA: Diagnosis not present

## 2022-01-29 DIAGNOSIS — Z8349 Family history of other endocrine, nutritional and metabolic diseases: Secondary | ICD-10-CM | POA: Diagnosis not present

## 2022-01-29 LAB — COMPREHENSIVE METABOLIC PANEL
ALT: 17 U/L (ref 0–44)
AST: 11 U/L — ABNORMAL LOW (ref 15–41)
Albumin: 2.7 g/dL — ABNORMAL LOW (ref 3.5–5.0)
Alkaline Phosphatase: 67 U/L (ref 38–126)
Anion gap: 10 (ref 5–15)
BUN: 8 mg/dL (ref 6–20)
CO2: 23 mmol/L (ref 22–32)
Calcium: 8.2 mg/dL — ABNORMAL LOW (ref 8.9–10.3)
Chloride: 104 mmol/L (ref 98–111)
Creatinine, Ser: 0.55 mg/dL (ref 0.44–1.00)
GFR, Estimated: >60 mL/min (ref >60)
Glucose, Bld: 90 mg/dL (ref 70–99)
Potassium: 3.6 mmol/L (ref 3.5–5.1)
Sodium: 137 mmol/L (ref 135–145)
Total Bilirubin: 0.5 mg/dL (ref 0.3–1.2)
Total Protein: 5.9 g/dL — ABNORMAL LOW (ref 6.5–8.1)

## 2022-01-29 LAB — CBC
HCT: 35 % — ABNORMAL LOW (ref 36.0–46.0)
Hemoglobin: 11 g/dL — ABNORMAL LOW (ref 12.0–15.0)
MCH: 28.3 pg (ref 26.0–34.0)
MCHC: 31.4 g/dL (ref 30.0–36.0)
MCV: 90 fL (ref 80.0–100.0)
Platelets: 518 10*3/uL — ABNORMAL HIGH (ref 150–400)
RBC: 3.89 MIL/uL (ref 3.87–5.11)
RDW: 14.5 % (ref 11.5–15.5)
WBC: 18.3 10*3/uL — ABNORMAL HIGH (ref 4.0–10.5)
nRBC: 0 % (ref 0.0–0.2)

## 2022-01-29 LAB — PROTIME-INR
INR: 1.1 (ref 0.8–1.2)
Prothrombin Time: 13.8 seconds (ref 11.4–15.2)

## 2022-01-29 MED ORDER — ENOXAPARIN SODIUM 40 MG/0.4ML IJ SOSY
40.0000 mg | PREFILLED_SYRINGE | INTRAMUSCULAR | Status: DC
  Administered 2022-01-31 – 2022-02-02 (×3): 40 mg via SUBCUTANEOUS
  Filled 2022-01-29 (×3): qty 0.4

## 2022-01-29 MED ORDER — BENZONATATE 100 MG PO CAPS
100.0000 mg | ORAL_CAPSULE | Freq: Three times a day (TID) | ORAL | Status: DC | PRN
  Administered 2022-01-29 – 2022-01-30 (×3): 100 mg via ORAL
  Filled 2022-01-29 (×3): qty 1

## 2022-01-29 NOTE — Progress Notes (Signed)
Per IR they will drain fluid tomorrow 5/10. Hold AM dose of lovenox on 5/10 ?

## 2022-01-29 NOTE — Progress Notes (Addendum)
[facility]  Date of Admission:  01/27/2022           Reason for visit: Follow up on intra-abdominal abscess  Current antibiotics: Ceftriaxone  Flagyl  ASSESSMENT:    56 y.o. female admitted with:  Subhepatic/perinephric abscess:  MRI abdomen 5/8 with contrast identified a stable in size, large complex fluid collection measuring about 5cm that is likely suspicious for infected biloma given recent ERCP.   Hx of gallstone pancreatitis s/p lap chole in July 2017:  This was complicated by postoperative fluid collection in the GB fossa (3.8x0.9x4.3cm) that was aspirated and had negative cultures.  She had a surgical JP drain at that time but no abscess drain placed. She received antibiotics during that admission but no further abx at discharge.  Diarrhea: C diff and GI panel negative last admission and improved. Sepsis: WBC slightly improved. Tmax 99.3.  RECOMMENDATIONS:    Continue ceftriaxone and flagyl PICC line once blood cx negative at 48 hrs Agree with IR evaluation for possible aspiration and potential drain placement Will follow Discussed with TRH and GI   Principal Problem:   Intra-abdominal infection Active Problems:   Paroxysmal SVT (supraventricular tachycardia) (HCC)   Other hyperlipidemia   GERD without esophagitis   Pleural effusion, bilateral    MEDICATIONS:    Scheduled Meds: . cholestyramine light  4 g Oral BID  . dicyclomine  10 mg Oral TID AC & HS  . enoxaparin (LOVENOX) injection  40 mg Subcutaneous Q24H  . sodium chloride flush  3 mL Intravenous Q12H   Continuous Infusions: . cefTRIAXone (ROCEPHIN)  IV Stopped (01/29/22 0035)  . metronidazole 500 mg (01/29/22 1220)   PRN Meds:.acetaminophen **OR** acetaminophen, albuterol, benzonatate, EPINEPHrine, famotidine, metoprolol tartrate  SUBJECTIVE:   24 hour events:  Afebrile Tolerating current antibiotics WBC slightly improved MRCP done overnight    Doing okay.   Has not had further fevers.  Abdominal pain is stable, diarrhea improved.  Discussed next steps.   Review of Systems  All other systems reviewed and are negative.    OBJECTIVE:   Blood pressure 105/63, pulse 87, temperature 98.7 F (37.1 C), temperature source Oral, resp. rate 16, height 5\' 4"  (1.626 m), weight 68 kg, SpO2 95 %. Body mass index is 25.75 kg/m.  Physical Exam Constitutional:      General: She is not in acute distress.    Appearance: Normal appearance.  HENT:     Head: Normocephalic and atraumatic.  Eyes:     Extraocular Movements: Extraocular movements intact.     Conjunctiva/sclera: Conjunctivae normal.  Pulmonary:     Effort: Pulmonary effort is normal. No respiratory distress.  Musculoskeletal:        General: Normal range of motion.     Cervical back: Normal range of motion and neck supple.  Skin:    General: Skin is warm and dry.  Neurological:     General: No focal deficit present.     Mental Status: She is alert and oriented to person, place, and time.  Psychiatric:        Mood and Affect: Mood normal.        Behavior: Behavior normal.     Lab Results: Lab Results  Component Value Date   WBC 18.3 (H) 01/29/2022   HGB 11.0 (L) 01/29/2022   HCT 35.0 (L) 01/29/2022   MCV 90.0 01/29/2022   PLT 518 (H) 01/29/2022    Lab Results  Component Value Date  NA 137 01/29/2022   K 3.6 01/29/2022   CO2 23 01/29/2022   GLUCOSE 90 01/29/2022   BUN 8 01/29/2022   CREATININE 0.55 01/29/2022   CALCIUM 8.2 (L) 01/29/2022   GFRNONAA >60 01/29/2022   GFRAA >60 04/17/2016    Lab Results  Component Value Date   ALT 17 01/29/2022   AST 11 (L) 01/29/2022   ALKPHOS 67 01/29/2022   BILITOT 0.5 01/29/2022    No results found for: CRP     Component Value Date/Time   ESRSEDRATE 2 11/28/2020 1000     I have reviewed the micro and lab results in Epic.  Imaging: CT Abdomen Pelvis Wo Contrast  Result Date: 01/27/2022 CLINICAL DATA:  Abdominal pain.  Right-sided retroperitoneal inflammatory process after ERCP with persistent fever. Status post recent right thoracentesis. EXAM: CT ABDOMEN AND PELVIS WITHOUT CONTRAST TECHNIQUE: Multidetector CT imaging of the abdomen and pelvis was performed following the standard protocol without IV contrast. RADIATION DOSE REDUCTION: This exam was performed according to the departmental dose-optimization program which includes automated exposure control, adjustment of the mA and/or kV according to patient size and/or use of iterative reconstruction technique. COMPARISON:  CT abdomen pelvis dated 01/24/2022. FINDINGS: Evaluation of this exam is limited in the absence of intravenous contrast. Lower chest: Small bilateral pleural effusions similar to prior CT. There is associated partial compressive atelectasis of the lower lobes. No intra-abdominal free air. Hepatobiliary: The liver is unremarkable. Cholecystectomy. Similar pneumobilia. Slight interval decrease in the subhepatic fluid collection compared to prior CT. This collection extends down along the right paracolic gutter similar to prior CT. Pancreas: Unremarkable. No pancreatic ductal dilatation or surrounding inflammatory changes. Spleen: Normal in size without focal abnormality. Adrenals/Urinary Tract: The left adrenal glands unremarkable. The right adrenal gland is not visualized. The left kidney is unremarkable. Punctate nonobstructing stone in the upper pole of the right kidney. No hydronephrosis. Similar appearance of right perinephric fluid collection as the prior CT. This may be related to prior hematoma. The urinary bladder is unremarkable. Stomach/Bowel: No bowel obstruction or active inflammation. The appendix is normal. Vascular/Lymphatic: IVC are unremarkable on this noncontrast CT. No portal venous gas. There is no adenopathy. Reproductive: The uterus and ovaries are grossly unremarkable. No adnexal masses. Other: None Musculoskeletal: Mild degenerative  changes of the spine. No acute osseous pathology. IMPRESSION: 1. No significant interval change in the right subhepatic and perinephric fluid collection compared to prior CT. 2. Small bilateral pleural effusions similar to prior CT.  A 3. Punctate nonobstructing right renal upper pole stone. No hydronephrosis. 4. No bowel obstruction. Normal appendix. Electronically Signed   By: Elgie Collard M.D.   On: 01/27/2022 22:42   DG Ribs Unilateral W/Chest Right  Result Date: 01/27/2022 CLINICAL DATA:  History of recent discharge from treatment of perforation from ERCP, presenting with fever and lower right rib tenderness. EXAM: RIGHT RIBS AND CHEST - 3+ VIEW COMPARISON:  Jan 24, 2022 FINDINGS: A radiopaque marker was placed at the site of the patient's pain. No fracture or other bone lesions are seen involving the ribs. There is no evidence of pneumothorax or pleural effusion. There is mild elevation of the right hemidiaphragm. Small, predominantly stable bilateral pleural effusions are seen. There is no evidence of an acute infiltrate or pneumothorax. Heart size and mediastinal contours are within normal limits. Radiopaque surgical clips are seen within the right upper quadrant. IMPRESSION: 1. No acute osseous abnormality. 2. Small, predominantly stable bilateral pleural effusions. Electronically Signed   By:  Aram Candela M.D.   On: 01/27/2022 17:51   MR ABDOMEN MRCP W WO CONTAST  Result Date: 01/28/2022 CLINICAL DATA:  Right upper quadrant abdominal pain. Right perinephric fluid collection on CT, 1st demonstrated 01/21/2022, new from previous CT 01/15/2022. Intra-abdominal abscess suspected following ERCP. EXAM: MRI ABDOMEN WITHOUT AND WITH CONTRAST (INCLUDING MRCP) TECHNIQUE: Multiplanar multisequence MR imaging of the abdomen was performed both before and after the administration of intravenous contrast. Heavily T2-weighted images of the biliary and pancreatic ducts were obtained, and three-dimensional  MRCP images were rendered by post processing. CONTRAST:  7mL GADAVIST GADOBUTROL 1 MMOL/ML IV SOLN COMPARISON:  Noncontrast abdominopelvic CT 01/27/2022, 01/24/2022, 01/21/2022 and 01/16/2022. Abdominal MRI 12/16/2021 head FINDINGS: Lower chest: Small right-greater-than-left pleural effusions, mildly improved from previous CTs and similar to yesterday's examination. Mild right lower lobe atelectasis. Hepatobiliary: The liver is normal in signal, without steatosis, abnormal enhancement or focal lesion. Status post cholecystectomy. No significant biliary dilatation. In correlation with recent CT, mild pneumobilia, without evidence of choledocholithiasis. Pancreas: Unremarkable. No pancreatic ductal dilatation or surrounding inflammatory changes. Spleen: Normal in size without focal abnormality. Adrenals/Urinary Tract: Both adrenal glands appear normal. Again demonstrated is a large complex multiloculated fluid collection in the subhepatic space which appears to involve the right perinephric space as well. This collection demonstrates no intrinsic T1 shortening to suggest hemorrhage. Following contrast, there is diffuse peripheral enhancement of this collection, suspicious for an infected fluid collection. Because of the shape of this collection, it is difficult to accurately measure, although there is a component superior to the right kidney which measures up to 5.2 x 3.1 cm on image 32/21. Inferior to the right kidney, there is a component measuring up to 4.4 x 5.7 cm on image 66/21. This collection is not significantly changed from the recent CT, and is new from 01/15/2022. No perinephric fluid collections are seen on the left. No evidence of hydronephrosis. Probable small incidental cyst in the interpolar region of the right kidney. Bladder not imaged. Stomach/Bowel: The stomach appears unremarkable for its degree of distension. No evidence of bowel wall thickening, distention or surrounding inflammatory change.  Vascular/Lymphatic: There are no enlarged abdominal lymph nodes. No evidence of acute vascular thrombosis or aneurysm. Other: No other intra-abdominal fluid collections are identified. No ascites. Musculoskeletal: No acute or significant osseous findings. IMPRESSION: 1. Stable size of large complex right perinephric fluid collection with heterogeneous peripheral enhancement following contrast, highly suspicious for an infected fluid collection. Differential considerations include infected urinoma and infected biloma given the patient's recent biliary intervention. As noted above, this collection is new from 01/15/2022 and not significantly changed in size from the most recent noncontrast abdominal CTs. 2. No hydronephrosis or significant biliary dilatation. 3. Small right-greater-than-left pleural effusions with associated bibasilar atelectasis. Electronically Signed   By: Carey Bullocks M.D.   On: 01/28/2022 19:05     Imaging independently reviewed in Epic.    [facility] [phone] pager 01/29/2022, 2:00 PM

## 2022-01-29 NOTE — Progress Notes (Signed)
? ? ?Referring Physician(s): ?Karki,A/Wallace,A ? ?Supervising Physician: Arne Cleveland ? ?Patient Status:  [facility] - In-pt ? ?Chief Complaint: ?Right upper abdominal pain/fluid collection  ? ? ?Subjective: ?Pt known to IR service from aspiration of perihepatic fluid collection in 2017 following cholecystectomy with negative cultures and right thoracentesis on 01/23/2022 also with negative cultures.  She has a past medical history significant for gallstone pancreatitis in 2017 with prior lap chole complicated by postop fluid collection in the gallbladder fossa and placement of surgical JP drain.  She received antibiotics during that admission but no further antibiotics at discharge.  She was recently admitted from 01/15/22 until 5/6 /23 for post ERCP microperforation with fluid collection in the gallbladder fossa and given antibiotics.  Procedure was performed secondary to choledocholithiasis with complete removal accomplished and biliary sphincterotomy performed.  No drain was placed.  During that admission she was also noted to have some pleural effusions as well as right retroperitoneal fluid inferior to the liver and lateral to the kidney.  She remained fever free for couple days prior to her discharge and was transitioned to oral antibiotics upon discharge.  She was readmitted on 5/7 with fever and increasing right upper quadrant pain despite antibiotics.  Follow-up CT on 5/7 revealed: ? ?1. No significant interval change in the right subhepatic and ?perinephric fluid collection compared to prior CT. ?2. Small bilateral pleural effusions similar to prior CT.  A ?3. Punctate nonobstructing right renal upper pole stone. No ?hydronephrosis. ?4. No bowel obstruction. Normal appendix ? ? ?MRI of the abdomen yesterday revealed:  ? ?1. Stable size of large complex right perinephric fluid collection ?with heterogeneous peripheral enhancement following contrast, highly ?suspicious for an infected fluid collection.  Differential ?considerations include infected urinoma and infected biloma given ?the patient's recent biliary intervention. As noted above, this ?collection is new from 01/15/2022 and not significantly changed in ?size from the most recent noncontrast abdominal CTs. ?2. No hydronephrosis or significant biliary dilatation. ?3. Small right-greater-than-left pleural effusions with associated ?bibasilar atelectasis. ? ?She is currently afebrile with normal PT/INR, WBC 18.3 (up from 11.4  four days ago), hemoglobin 11, platelets 518k, creatinine normal, total bilirubin normal, blood cultures negative to date.  She is on IV Flagyl and Rocephin.  Past medical history also significant for aortic aneurysm, melanoma, ocular migraine, osteomalacia.  Request now received for image guided aspiration/drainage of right subhepatic/perinephric fluid collection. ? ? ?Past Medical History:  ?Diagnosis Date  ? Allergic rhinitis   ? Aortic aneurysm ([place]) 09/23/2020  ? Cancer [facility])   ? Melanoma  ? Cervical dysplasia 2007  ? low-grade SIL, normal Pap smears since then  ? Family history of adverse reaction to anesthesia   ? mom has h/o post op nausea/vomiting  ? Intermittent palpitations   ? IUD   ? inserted 03/2002.04/06/2007, 06/03/2012  ? Ocular migraine   ? Osteomalacia   ? Pancreatitis   ? Seafood allergy   ? ?Past Surgical History:  ?Procedure Laterality Date  ? BIOPSY  01/21/2022  ? Procedure: BIOPSY;  Surgeon: Wilford Corner, MD;  Location: [facility];  Service: Gastroenterology;;  ? Lillard Anes  2018  ? CHOLECYSTECTOMY N/A 04/09/2016  ? Procedure: LAPAROSCOPIC CHOLECYSTECTOMY ;  Surgeon: Rolm Bookbinder, MD;  Location: [facility];  Service: General;  Laterality: N/A;  ? ERCP  04/11/2016  ? ERCP with pancreatic stent and failed CBD stent after needle knife and conventional sphincterotomy  ? ERCP N/A 04/12/2016  ? Procedure: ENDOSCOPIC RETROGRADE CHOLANGIOPANCREATOGRAPHY (ERCP);  Surgeon: Clarene Essex,  MD;  Location: [facility];   Service: Endoscopy;  Laterality: N/A;  ? ERCP N/A 01/15/2022  ? Procedure: ENDOSCOPIC RETROGRADE CHOLANGIOPANCREATOGRAPHY (ERCP);  Surgeon: Clarene Essex, MD;  Location: [facility];  Service: Gastroenterology;  Laterality: N/A;  ? FLEXIBLE SIGMOIDOSCOPY N/A 01/21/2022  ? Procedure: FLEXIBLE SIGMOIDOSCOPY;  Surgeon: Wilford Corner, MD;  Location: [facility];  Service: Gastroenterology;  Laterality: N/A;  ? FOOT SURGERY    ? toenail  ? INTRAUTERINE DEVICE INSERTION  08/07/2017  ? Mirena  ? removal of melanoma    ? REMOVAL OF STONES  01/15/2022  ? Procedure: REMOVAL OF STONES;  Surgeon: Clarene Essex, MD;  Location: [facility];  Service: Gastroenterology;;  ? [name]  01/15/2022  ? Procedure: SPHINCTEROTOMY;  Surgeon: Clarene Essex, MD;  Location: [facility];  Service: Gastroenterology;;  ? ? ? ?Allergies: ?Cefepime, Contrast media [iodinated contrast media], Iodine, Other, Ampicillin, Avocado, Levofloxacin, Pantoprazole, Septra [bactrim], Sulfamethoxazole-trimethoprim, Vancomycin, and Colestipol hcl ? ?Medications: ?Prior to Admission medications   ?Medication Sig Start Date End Date Taking? Authorizing Provider  ?acetaminophen (TYLENOL) 500 MG tablet Take 1,000 mg by mouth every 6 (six) hours as needed for mild pain or fever.   Yes [provider]  ?albuterol (PROAIR HFA) 108 (90 Base) MCG/ACT inhaler Inhale 2 puffs into the lungs every 4 (four) hours as needed for wheezing or shortness of breath (coughing fits). 05/23/21  Yes Garnet Sierras, DO  ?amoxicillin-clavulanate (AUGMENTIN) 875-125 MG tablet Take 1 tablet by mouth every 12 (twelve) hours for 6 days. 01/26/22 02/01/22 Yes Regalado, Belkys A, MD  ?cholestyramine light (PREVALITE) 4 g packet Take 1 packet (4 g total) by mouth 2 (two) times daily. 01/26/22  Yes Regalado, Belkys A, MD  ?eletriptan (RELPAX) 40 MG tablet 40 mg every 2 (two) hours as needed for migraine. 05/18/20  Yes [provider]  ?EPINEPHrine 0.3 mg/0.3 mL IJ SOAJ injection  Inject 0.3 mg into the muscle as needed for anaphylaxis.   Yes [provider]  ?famotidine (PEPCID) 20 MG tablet Take 20 mg by mouth daily as needed for heartburn or indigestion.   Yes [provider]  ?fluticasone (FLONASE) 50 MCG/ACT nasal spray Place 2 sprays into both nostrils 2 (two) times daily as needed for allergies. 07/14/20  Yes [provider]  ?ketotifen (ZADITOR) 0.025 % ophthalmic solution Place 1 drop into both eyes daily as needed (Itching eye and allergies).   Yes [provider]  ?metoprolol tartrate (LOPRESSOR) 25 MG tablet Take 1 tablet (25 mg total) by mouth daily as needed (svt). 01/26/22  Yes Regalado, Belkys A, MD  ?potassium chloride (KLOR-CON M) 10 MEQ tablet Take 1 tablet (10 mEq total) by mouth daily for 3 days. 01/26/22 01/29/22 Yes Regalado, Cassie Freer, MD  ? ? ? ?Vital Signs: ?BP 105/63 (BP Location: Left Arm)   Pulse 87   Temp 98.7 ?F (37.1 ?C) (Oral)   Resp 16   Ht '5\' 4"'$  (1.626 m)   Wt 150 lb (68 kg)   SpO2 95%   BMI 25.75 kg/m?  ? ?Physical Exam awake, alert.  Chest with slightly diminished breath sounds at bases.  Heart with regular rate and rhythm.  Abdomen soft, positive bowel sounds, mildly tender right upper quadrant to palpation.  No lower extremity edema. ? ?Imaging: ?CT Abdomen Pelvis Wo Contrast ? ?Result Date: 01/27/2022 ?CLINICAL DATA:  Abdominal pain. Right-sided retroperitoneal inflammatory process after ERCP with persistent fever. Status post recent right thoracentesis. EXAM: CT ABDOMEN AND PELVIS WITHOUT CONTRAST TECHNIQUE: Multidetector  CT imaging of the abdomen and pelvis was performed following the standard protocol without IV contrast. RADIATION DOSE REDUCTION: This exam was performed according to the departmental dose-optimization program which includes automated exposure control, adjustment of the mA and/or kV according to patient size and/or use of iterative reconstruction technique. COMPARISON:  CT abdomen pelvis dated  01/24/2022. FINDINGS: Evaluation of this exam is limited in the absence of intravenous contrast. Lower chest: Small bilateral pleural effusions similar to prior CT. There is associated partial compressive atelectasis of the lower lobe

## 2022-01-29 NOTE — TOC Initial Note (Signed)
Transition of Care (TOC) - Initial/Assessment Note  ? ? ?Patient Details  ?Name: Laurie Terry ?MRN: [id number] ?Date of Birth: 29-Jan-1966 ? ?Transition of Care [facility]) CM/SW Contact:    ?Leeroy Cha, RN ?Phone Number: ?01/29/2022, 8:37 AM ? ?Clinical Narrative:                 ? ?Transition of Care (TOC) Screening Note ? ? ?Patient Details  ?Name: Laurie Terry ?Date of Birth: March 22, 1966 ? ? ?Transition of Care [facility]) CM/SW Contact:    ?Leeroy Cha, RN ?Phone Number: ?01/29/2022, 8:37 AM ? ? ? ?Transition of Care Department [facility]) has reviewed patient and no TOC needs have been identified at this time. We will continue to monitor patient advancement through interdisciplinary progression rounds. If new patient transition needs arise, please place a TOC consult. ? ? ? ?Expected Discharge Plan: Home/Self Care ?Barriers to Discharge: No Barriers Identified ? ? ?Patient Goals and CMS Choice ?Patient states their goals for this hospitalization and ongoing recovery are:: to return to my home ?CMS Medicare.gov Compare Post Acute Care list provided to:: Patient ?  ? ?Expected Discharge Plan and Services ?Expected Discharge Plan: Home/Self Care ?  ?Discharge Planning Services: CM Consult ?  ?Living arrangements for the past 2 months: [place] ?                ?  ?  ?  ?  ?  ?  ?  ?  ?  ?  ? ?Prior Living Arrangements/Services ?Living arrangements for the past 2 months: [place] ?Lives with:: Spouse ?Patient language and need for interpreter reviewed:: Yes ?Do you feel safe going back to the place where you live?: Yes      ?  ?  ?  ?Criminal Activity/Legal Involvement Pertinent to Current Situation/Hospitalization: No - Comment as needed ? ?Activities of Daily Living ?Home Assistive Devices/Equipment: None ?ADL Screening (condition at time of admission) ?Patient's cognitive ability adequate to safely complete daily activities?: Yes ?Is the patient deaf or have difficulty hearing?: No ?Does  the patient have difficulty seeing, even when wearing glasses/contacts?: No ?Does the patient have difficulty concentrating, remembering, or making decisions?: No ?Patient able to express need for assistance with ADLs?: Yes ?Does the patient have difficulty dressing or bathing?: No ?Independently performs ADLs?: Yes (appropriate for developmental age) ?Does the patient have difficulty walking or climbing stairs?: No ?Weakness of Legs: None ?Weakness of Arms/Hands: None ? ?Permission Sought/Granted ?  ?  ?   ?   ?   ?   ? ?Emotional Assessment ?Appearance:: Appears stated age ?  ?  ?Orientation: : Oriented to Self, Oriented to Place, Oriented to  Time, Oriented to Situation ?Alcohol / Substance Use: Not Applicable ?Psych Involvement: No (comment) ? ?Admission diagnosis:  Intra-abdominal infection [B99.9] ?Abdominal infection ([place]) [K65.9] ?Fever, unspecified fever cause [R50.9] ?Patient Active Problem List  ? Diagnosis Date Noted  ? Intra-abdominal infection 01/27/2022  ? Fever 01/26/2022  ? Pleural effusion, bilateral   ? Post-ERCP acute pancreatitis 01/16/2022  ? GERD without esophagitis 01/16/2022  ? Choledocholithiasis 01/16/2022  ? Acute pancreatitis 01/16/2022  ? Adverse effect of other drugs, medicaments and biological substances, subsequent encounter 05/23/2021  ? Other allergic rhinitis 05/23/2021  ? Allergic conjunctivitis of both eyes 05/23/2021  ? History of asthma 05/23/2021  ? Oral allergy syndrome, subsequent encounter 05/23/2021  ? Family history of bicuspid aortic valve 11/28/2020  ? Other hyperlipidemia 11/28/2020  ? Chest pain of  uncertain etiology 77/82/4235  ? Thoracic aortic aneurysm without rupture ([place]) 10/30/2020  ? PAC (premature atrial contraction) 10/30/2020  ? Postoperative bile leak 04/12/2016  ? Acute hypokalemia 04/12/2016  ? Paroxysmal SVT (supraventricular tachycardia) ([place]) 04/12/2016  ? Bile leak, postoperative 04/12/2016  ? S/P laparoscopic cholecystectomy   ? History of Gallstone  pancreatitis 04/07/2016  ? Pancreatitis due to biliary obstruction 04/07/2016  ? Cancer [facility])   ? Osteopenia   ? ?PCP:  [name], MD ?Pharmacy:   ?[facility] [place] ?[place] ?[place] [address] ?Phone: [phone] Fax: [phone] ? ?[address] ?[place] ?[place] [address] ?Phone: [phone] Fax: [phone] ? ? ? ? ?Social Determinants of Health (SDOH) Interventions ?  ? ?Readmission Risk Interventions ?   ? View : No data to display.  ?  ?  ?  ? ? ? ?

## 2022-01-29 NOTE — Progress Notes (Signed)
[facility] Progress Note ? ?Krizia Flight Pavao 56 y.o. [birth year] ? ?CC: Intra-abdominal infection, diarrhea ? ? ?Subjective: ?Seen examined sitting comfortably.  Notes she had no bowel movements yesterday.  Abdominal pain improving.  Tolerating normal diet well.  Afebrile.  Stomach cramping is improving with dicyclomine. ? ?ROS : Review of Systems  ?Gastrointestinal:  Negative for abdominal pain, blood in stool, constipation, diarrhea, heartburn, melena, nausea and vomiting.  ?Genitourinary:  Negative for dysuria and urgency.  ? ? ? ?Objective: ?Vital signs in last 24 hours: ?Vitals:  ? 01/29/22 0556 01/29/22 0743  ?BP: 101/60 105/63  ?Pulse: 78 87  ?Resp: 18 16  ?Temp: 98.6 ?F (37 ?C) 98.7 ?F (37.1 ?C)  ?SpO2: 94% 95%  ? ? ?Physical Exam: ? ?General:  Alert, cooperative, no distress, appears stated age  ?Head:  Normocephalic, without obvious abnormality, atraumatic  ?Eyes:  Anicteric sclera, EOM's intact  ?Lungs:   Clear to auscultation bilaterally, respirations unlabored  ?Heart:  Regular rate and rhythm, S1, S2 normal  ?Abdomen:   Soft, non-tender, bowel sounds active all four quadrants,  no masses,   ?Extremities: Extremities normal, atraumatic, no  edema  ?Pulses: 2+ and symmetric  ? ? ?Lab Results: ?Recent Labs  ?  01/28/22 ?3419 01/29/22 ?0401  ?NA 140 137  ?K 3.4* 3.6  ?CL 106 104  ?CO2 26 23  ?GLUCOSE 102* 90  ?BUN 6 8  ?CREATININE 0.46 0.55  ?CALCIUM 8.3* 8.2*  ? ?Recent Labs  ?  01/28/22 ?3790 01/29/22 ?0401  ?AST 18 11*  ?ALT 20 17  ?ALKPHOS 63 67  ?BILITOT 0.5 0.5  ?PROT 5.8* 5.9*  ?ALBUMIN 2.7* 2.7*  ? ?Recent Labs  ?  01/27/22 ?1834 01/28/22 ?2409 01/29/22 ?0401  ?WBC 19.7* 19.9* 18.3*  ?NEUTROABS 15.7*  --   --   ?HGB 12.3 10.5* 11.0*  ?HCT 36.4 33.0* 35.0*  ?MCV 89.2 89.7 90.0  ?PLT 430* 463* 518*  ? ?No results for input(s): LABPROT, INR in the last 72 hours. ? ? ? ?Assessment ?Abdominal infection ?Abdominal pain ?Diarrhea ? ?CT abdomen pelvis without contrast 01/27/2022 ?1. No  significant interval change in the right subhepatic and perinephric fluid collection compared to prior CT. 2. Small bilateral pleural effusions similar to prior CT.  A 3. Punctate nonobstructing right renal upper pole stone. No hydronephrosis. 4. No bowel obstruction. Normal appendix. ? ?MRCP with and without contrast 01/28/2022 ?1. Stable size of large complex right perinephric fluid collection ?with heterogeneous peripheral enhancement following contrast, highly suspicious for an infected fluid collection. this ?collection is new from 01/15/2022 and not significantly changed in ?size from the most recent noncontrast abdominal CTs. ?2. No hydronephrosis or significant biliary dilatation. ?3. Small right-greater-than-left pleural effusions with associated ?bibasilar atelectasis. ?  ?White blood cells elevated at 19.7 on admission, 18.3 today.  01/25/2022 WBC  11.4.  Patient also had possible reactive thrombocytosis platelets now at 518. ?  ?Hemoglobin stable at 11.  ? ?Diarrhea resolved as of today.  No bowel movements in the last 24 hours.  Continue cholestyramine as needed. ?  ?Blood cultures in process, will follow.  At this time no growth after 1 day. ?  ?No febrile episodes since admission.  Patient's pain seems to also be improved with IV antibiotics.  She is currently on ceftriaxone and Flagyl.  Appreciate ID input on outpatient antibiotic therapy. ?  ?Right upper quadrant pain resolved at this time.  Stomach cramping improved with dicyclomine. ? ? ?Plan: ?Continue supportive care ?Continue dicyclomine  10 mg up to 4 times daily before meals.   ? Appreciate ID recommendations for outpatient antibiotic therapy as well as possible drainage for infected fluid collection in the abdomen.   ?Continue cholestyramine for diarrhea. ?Continue ceftriaxone and Flagyl. ?No plans for procedure at this time, patient okay to continue on normal solid diet. ?Continue to monitor hemoglobin will transfuse if less than 7. ?[facility]  will follow ? ?Charlott Rakes PA-C ?01/29/2022, 10:19 AM ? ?Contact #  [phone]  ?

## 2022-01-29 NOTE — Plan of Care (Signed)
Plan of care reviewed and discussed with the patient. 

## 2022-01-29 NOTE — Progress Notes (Signed)
?Progress Note ? ? ?Patient: Laurie Terry PRF:[id number] DOB: 1966/01/12 DOA: 01/27/2022     0 ?DOS: the patient was seen and examined on 01/29/2022 ?  ?Brief hospital course: ?56 y.o. female with medical history significant of proximal SVT, GERD, pancreatitis status post laparoscopic cholecystectomy, thoracic aortic aneurysm, hyperlipidemia, pleural effusions, posterior CP microperforation with fluid collection presenting with fever, rib pain, diarrhea. ? ?Patient presenting with repeat fever at home greater than 101 with increasing right upper quadrant pain despite antibiotics. ?  ?She was recently admitted from April 25 until May 6 for post ERCP microperforation with fluid collection at the gallbladder fossa, noted to have fevers and largely stable white count although was on antibiotics during admission thought process was this was secondary to inflammatory reaction secondary to this microperforation leading to this fluid in the fossa as well as reactive pleural effusions (treated with above antibiotics and thoracentesis). ?  ?She was covered with antibiotics during admission had reaction to cefepime and oral vancomycin but responded well to ceftriaxone and Flagyl.  She had remained fever free for couple days prior to discharge and was transition to Augmentin for 7-day course on discharge.  She has been taking his medicine for just a day and today developed a fever of 101.5.  Also with increasing right upper quadrant pain as above. ? ?Assessment and Plan: ?No notes have been filed under this hospital service. ?Service: Hospitalist ? ?Intra-abdominal infection, presumed ?Post ERCP micro perforation ?Leukocytosis/Fever ?> Patient presenting after discharge home from recent admission as detailed in HPI with recurrent fever of 101.5 and increasing right upper quadrant pain. ?> Recent admit for post ERCP microperforation, with fluid collection in the gallbladder fossa and pleural effusion thought to be reactive  versus infectious.  Covered with antibiotics with good response and fever free for 2 days prior to discharge (note did have reaction to cefepime and/or vancomycin during admission).  She was discharged on p.o. Augmentin and after 1 to 2 days at home has had recurrence of fevers on this regimen. ?> with her fever significant increase in leukocytosis to 19.7 on presentation.  Down to 18.3 ?> Continue ceftriaxone and metronidazole ?- blood cultures pending ?-Afebrile ?-ID and GI following. ID recs for PICC line once blood cx neg at 48hr  ?-MRI reviewed. Findings of stable large complex R perinephric fluid collection suspicious for infected fluid ?-Discussed with ID and GI who recommends IR eval for drainage ?  ?Bilateral pleural effusions ?> Remained stable on imaging in the ED. ?- Have been seen previously by pulmonology and this is all thought to be reactive secondary to above. Body fluid culture with no growth and no organisms. ?-On minimal O2 support ? ?Diarrhea ?> Has continued to have some diarrhea worse in the last couple days since discharge. ?> Negative infectious work-up during recent admission. ?- Continue cholestyramine per above ? ?Paroxysmal SVT ?- Continue as needed metoprolol ?  ? ?  ? ?Subjective: States abd pain has improved more today. Still very eager to go home soon ? ?Physical Exam: ?Vitals:  ? 01/28/22 1343 01/28/22 2216 01/29/22 0556 01/29/22 0743  ?BP: 98/68 101/60 101/60 105/63  ?Pulse: 80 90 78 87  ?Resp: '16 18 18 16  '$ ?Temp: 98.9 ?F (37.2 ?C) 99.3 ?F (37.4 ?C) 98.6 ?F (37 ?C) 98.7 ?F (37.1 ?C)  ?TempSrc: Oral Oral Oral Oral  ?SpO2: 96% 96% 94% 95%  ?Weight:      ?Height:      ? ?General exam: Conversant, in no  acute distress ?Respiratory system: normal chest rise, clear, no audible wheezing ?Cardiovascular system: regular rhythm, s1-s2 ?Gastrointestinal system: Nondistended, nontender, pos BS ?Central nervous system: No seizures, no tremors ?Extremities: No cyanosis, no joint  deformities ?Skin: No rashes, no pallor ?Psychiatry: Affect normal // no auditory hallucinations  ? ?Data Reviewed: ? ?Labs reviewed: K 3.6, Cr 0.55, WBC 18.3, hgb 11.0 ? ?Family Communication: Pt in room, family not at bedside ? ?Disposition: ?Status is: Observation ?The patient remains OBS appropriate and will d/c before 2 midnights. ? Planned Discharge Destination: Home ? ? ? ?Author: ?Marylu Lund, MD ?01/29/2022 4:32 PM ? ?For on call review [email or web address].  ?

## 2022-01-30 ENCOUNTER — Encounter (HOSPITAL_COMMUNITY): Payer: Self-pay | Admitting: Internal Medicine

## 2022-01-30 ENCOUNTER — Inpatient Hospital Stay (HOSPITAL_COMMUNITY): Payer: 59

## 2022-01-30 DIAGNOSIS — K219 Gastro-esophageal reflux disease without esophagitis: Secondary | ICD-10-CM

## 2022-01-30 DIAGNOSIS — A419 Sepsis, unspecified organism: Secondary | ICD-10-CM | POA: Diagnosis not present

## 2022-01-30 DIAGNOSIS — R509 Fever, unspecified: Secondary | ICD-10-CM | POA: Diagnosis not present

## 2022-01-30 DIAGNOSIS — K659 Peritonitis, unspecified: Secondary | ICD-10-CM | POA: Diagnosis not present

## 2022-01-30 DIAGNOSIS — B999 Unspecified infectious disease: Secondary | ICD-10-CM | POA: Diagnosis not present

## 2022-01-30 LAB — COMPREHENSIVE METABOLIC PANEL
ALT: 15 U/L (ref 0–44)
AST: 11 U/L — ABNORMAL LOW (ref 15–41)
Albumin: 2.6 g/dL — ABNORMAL LOW (ref 3.5–5.0)
Alkaline Phosphatase: 67 U/L (ref 38–126)
Anion gap: 8 (ref 5–15)
BUN: 7 mg/dL (ref 6–20)
CO2: 27 mmol/L (ref 22–32)
Calcium: 8.3 mg/dL — ABNORMAL LOW (ref 8.9–10.3)
Chloride: 105 mmol/L (ref 98–111)
Creatinine, Ser: 0.6 mg/dL (ref 0.44–1.00)
GFR, Estimated: >60 mL/min (ref >60)
Glucose, Bld: 97 mg/dL (ref 70–99)
Potassium: 3.8 mmol/L (ref 3.5–5.1)
Sodium: 140 mmol/L (ref 135–145)
Total Bilirubin: 0.5 mg/dL (ref 0.3–1.2)
Total Protein: 6 g/dL — ABNORMAL LOW (ref 6.5–8.1)

## 2022-01-30 LAB — CBC
HCT: 34.3 % — ABNORMAL LOW (ref 36.0–46.0)
Hemoglobin: 11.2 g/dL — ABNORMAL LOW (ref 12.0–15.0)
MCH: 29.6 pg (ref 26.0–34.0)
MCHC: 32.7 g/dL (ref 30.0–36.0)
MCV: 90.5 fL (ref 80.0–100.0)
Platelets: 535 10*3/uL — ABNORMAL HIGH (ref 150–400)
RBC: 3.79 MIL/uL — ABNORMAL LOW (ref 3.87–5.11)
RDW: 14.3 % (ref 11.5–15.5)
WBC: 13.6 10*3/uL — ABNORMAL HIGH (ref 4.0–10.5)
nRBC: 0 % (ref 0.0–0.2)

## 2022-01-30 MED ORDER — FENTANYL CITRATE (PF) 100 MCG/2ML IJ SOLN
INTRAMUSCULAR | Status: AC
  Filled 2022-01-30: qty 4

## 2022-01-30 MED ORDER — LIDOCAINE HCL 1 % IJ SOLN
INTRAMUSCULAR | Status: AC | PRN
  Administered 2022-01-30: 10 mL via INTRADERMAL

## 2022-01-30 MED ORDER — HYDROCOD POLI-CHLORPHE POLI ER 10-8 MG/5ML PO SUER
5.0000 mL | Freq: Two times a day (BID) | ORAL | Status: DC
  Administered 2022-01-30: 2.5 mL via ORAL
  Administered 2022-01-31: 5 mL via ORAL
  Filled 2022-01-30 (×4): qty 5

## 2022-01-30 MED ORDER — FLUMAZENIL 0.5 MG/5ML IV SOLN
INTRAVENOUS | Status: AC
  Filled 2022-01-30: qty 5

## 2022-01-30 MED ORDER — MIDAZOLAM HCL 2 MG/2ML IJ SOLN
INTRAMUSCULAR | Status: AC
  Filled 2022-01-30: qty 4

## 2022-01-30 MED ORDER — FENTANYL CITRATE (PF) 100 MCG/2ML IJ SOLN
INTRAMUSCULAR | Status: AC | PRN
  Administered 2022-01-30: 25 ug via INTRAVENOUS

## 2022-01-30 MED ORDER — HYDROCODONE-ACETAMINOPHEN 5-325 MG PO TABS
1.0000 | ORAL_TABLET | ORAL | Status: DC | PRN
  Administered 2022-01-31: 1 via ORAL
  Filled 2022-01-30: qty 1

## 2022-01-30 MED ORDER — DICLOFENAC SODIUM 1 % EX GEL
4.0000 g | Freq: Four times a day (QID) | CUTANEOUS | Status: DC
  Administered 2022-01-30 – 2022-02-02 (×12): 4 g via TOPICAL
  Filled 2022-01-30 (×2): qty 100

## 2022-01-30 MED ORDER — MIDAZOLAM HCL 2 MG/2ML IJ SOLN
INTRAMUSCULAR | Status: AC | PRN
  Administered 2022-01-30: .5 mg via INTRAVENOUS

## 2022-01-30 MED ORDER — NALOXONE HCL 0.4 MG/ML IJ SOLN
INTRAMUSCULAR | Status: DC
Start: 2022-01-30 — End: 2022-01-30
  Filled 2022-01-30: qty 1

## 2022-01-30 MED ORDER — SODIUM CHLORIDE 0.9% FLUSH
5.0000 mL | Freq: Three times a day (TID) | INTRAVENOUS | Status: DC
  Administered 2022-01-30 – 2022-02-02 (×8): 5 mL

## 2022-01-30 NOTE — Procedures (Signed)
?  Procedure:  CT guided R retroperitoneal drain placement 77m purulent sent for GS, C&S ?Preprocedure diagnosis: The primary encounter diagnosis was Fever, unspecified fever cause. A diagnosis of Abdominal infection ([place] was also pertinent to this visit. ? ?Postprocedure diagnosis: same ?EBL:    minimal ?Complications:   none immediate ? ?[facility] ? ?D. DArne ClevelandMD ?Main # [phone]?Pager  [phone] ?Mobile [phone] ?  ? ?

## 2022-01-30 NOTE — Plan of Care (Signed)
?  Problem: Education: ?Goal: Knowledge of General Education information will improve ?Description: Including pain rating scale, medication(s)/side effects and non-pharmacologic comfort measures ?Outcome: Progressing ?  ?Problem: Clinical Measurements: ?Goal: Ability to maintain clinical measurements within normal limits will improve ?Outcome: Progressing ?Goal: Will remain free from infection ?Outcome: Progressing ?Goal: Diagnostic test results will improve ?Outcome: Progressing ?  ?Problem: Nutrition: ?Goal: Adequate nutrition will be maintained ?Outcome: Progressing ?  ?Problem: Coping: ?Goal: Level of anxiety will decrease ?Outcome: Progressing ?  ?Problem: Elimination: ?Goal: Will not experience complications related to bowel motility ?Outcome: Progressing ?  ?Problem: Pain Managment: ?Goal: General experience of comfort will improve ?Outcome: Progressing ?  ?

## 2022-01-30 NOTE — Progress Notes (Signed)
?   ? ?[facility] ? ?Date of Admission:  01/27/2022    ?       ?Reason for visit: Follow up on intra-abdominal infection ? ?Current antibiotics: ?Ceftriaxone ?Flagyl ? ?ASSESSMENT:   ? ?56 y.o. female admitted with: ? ?Subhepatic/perinephric abscess:  MRI abdomen 5/8 with contrast identified a stable in size, large complex fluid collection measuring about 5cm that is likely suspicious for infected biloma given recent ERCP.  Planning for IR intervention today for aspiration/drainage. ?Hx of gallstone pancreatitis s/p lap chole in July 2017:  This was complicated by postoperative fluid collection in the GB fossa (3.8x0.9x4.3cm) that was aspirated and had negative cultures.  She had a surgical JP drain at that time but no abscess drain placed. She received antibiotics during that admission but no further abx at discharge.  ?Sepsis: WBC improved. Tmax 100.3. ? ?RECOMMENDATIONS:   ? ?Continue ceftriaxone and flagyl ?Appreciate IR assistance ?Will follow cultures from procedure today ?Okay to place PICC line now that cx are negative at 48 hrs ?Will follow.  ? ? ?Principal Problem: ?  Intra-abdominal infection ?Active Problems: ?  Paroxysmal SVT (supraventricular tachycardia) (HCC) ?  Other hyperlipidemia ?  GERD without esophagitis ?  Pleural effusion, bilateral ? ? ? ?MEDICATIONS:   ? ?Scheduled Meds: ? cholestyramine light  4 g Oral BID  ? dicyclomine  10 mg Oral TID AC & HS  ? [START ON 01/31/2022] enoxaparin (LOVENOX) injection  40 mg Subcutaneous Q24H  ? sodium chloride flush  3 mL Intravenous Q12H  ? ?Continuous Infusions: ? cefTRIAXone (ROCEPHIN)  IV 2 g (01/30/22 0126)  ? metronidazole 500 mg (01/30/22 0127)  ? ?PRN Meds:.acetaminophen **OR** acetaminophen, albuterol, benzonatate, EPINEPHrine, famotidine, metoprolol tartrate ? ?SUBJECTIVE:  ? ?24 hour events:  ?No major events ?WBC improving ?Borderline febrile 100.3 ?LFTs normal ?No new imaging ?Blood cx remain negative ? ?Feels okay  overall.  Waiting on IR procedure and is thirsty.   Coughing intermittently and reported higher temp last night with sweats. ? ?Review of Systems  ?All other systems reviewed and are negative. ? ?  ?OBJECTIVE:  ? ?Blood pressure 96/62, pulse 76, temperature 98.8 ?F (37.1 ?C), temperature source Oral, resp. rate 18, height '5\' 4"'$  (1.626 m), weight 68 kg, SpO2 95 %. ?Body mass index is 25.75 kg/m?. ? ?Physical Exam ?Constitutional:   ?   General: She is not in acute distress. ?   Appearance: Normal appearance.  ?HENT:  ?   Head: Normocephalic and atraumatic.  ?Eyes:  ?   Extraocular Movements: Extraocular movements intact.  ?   Conjunctiva/sclera: Conjunctivae normal.  ?Abdominal:  ?   General: There is no distension.  ?   Palpations: Abdomen is soft.  ?Musculoskeletal:     ?   General: Normal range of motion.  ?   Cervical back: Normal range of motion and neck supple.  ?Skin: ?   General: Skin is warm and dry.  ?   Findings: No rash.  ?Neurological:  ?   General: No focal deficit present.  ?   Mental Status: She is alert and oriented to person, place, and time.  ?Psychiatric:     ?   Mood and Affect: Mood normal.     ?   Behavior: Behavior normal.  ? ? ? ?Lab Results: ?Lab Results  ?Component Value Date  ? WBC 13.6 (H) 01/30/2022  ? HGB 11.2 (L) 01/30/2022  ? HCT 34.3 (L) 01/30/2022  ? MCV 90.5 01/30/2022  ?  PLT 535 (H) 01/30/2022  ?  ?Lab Results  ?Component Value Date  ? NA 140 01/30/2022  ? K 3.8 01/30/2022  ? CO2 27 01/30/2022  ? GLUCOSE 97 01/30/2022  ? BUN 7 01/30/2022  ? CREATININE 0.60 01/30/2022  ? CALCIUM 8.3 (L) 01/30/2022  ? GFRNONAA >60 01/30/2022  ? GFRAA >60 04/17/2016  ?  ?Lab Results  ?Component Value Date  ? ALT 15 01/30/2022  ? AST 11 (L) 01/30/2022  ? ALKPHOS 67 01/30/2022  ? BILITOT 0.5 01/30/2022  ? ? ?No results found for: CRP ? ?   ?Component Value Date/Time  ? ESRSEDRATE 2 11/28/2020 1000  ? ?  ?I have reviewed the micro and lab results in Epic. ? ?Imaging: ?MR ABDOMEN MRCP W WO  CONTAST ? ?Result Date: 01/28/2022 ?CLINICAL DATA:  Right upper quadrant abdominal pain. Right perinephric fluid collection on CT, 1st demonstrated 01/21/2022, new from previous CT 01/15/2022. Intra-abdominal abscess suspected following ERCP. EXAM: MRI ABDOMEN WITHOUT AND WITH CONTRAST (INCLUDING MRCP) TECHNIQUE: Multiplanar multisequence MR imaging of the abdomen was performed both before and after the administration of intravenous contrast. Heavily T2-weighted images of the biliary and pancreatic ducts were obtained, and three-dimensional MRCP images were rendered by post processing. CONTRAST:  109m GADAVIST GADOBUTROL 1 MMOL/ML IV SOLN COMPARISON:  Noncontrast abdominopelvic CT 01/27/2022, 01/24/2022, 01/21/2022 and 01/16/2022. Abdominal MRI 12/16/2021 head FINDINGS: Lower chest: Small right-greater-than-left pleural effusions, mildly improved from previous CTs and similar to yesterday's examination. Mild right lower lobe atelectasis. Hepatobiliary: The liver is normal in signal, without steatosis, abnormal enhancement or focal lesion. Status post cholecystectomy. No significant biliary dilatation. In correlation with recent CT, mild pneumobilia, without evidence of choledocholithiasis. Pancreas: Unremarkable. No pancreatic ductal dilatation or surrounding inflammatory changes. Spleen: Normal in size without focal abnormality. Adrenals/Urinary Tract: Both adrenal glands appear normal. Again demonstrated is a large complex multiloculated fluid collection in the subhepatic space which appears to involve the right perinephric space as well. This collection demonstrates no intrinsic T1 shortening to suggest hemorrhage. Following contrast, there is diffuse peripheral enhancement of this collection, suspicious for an infected fluid collection. Because of the shape of this collection, it is difficult to accurately measure, although there is a component superior to the right kidney which measures up to 5.2 x 3.1 cm on image  32/21. Inferior to the right kidney, there is a component measuring up to 4.4 x 5.7 cm on image 66/21. This collection is not significantly changed from the recent CT, and is new from 01/15/2022. No perinephric fluid collections are seen on the left. No evidence of hydronephrosis. Probable small incidental cyst in the interpolar region of the right kidney. Bladder not imaged. Stomach/Bowel: The stomach appears unremarkable for its degree of distension. No evidence of bowel wall thickening, distention or surrounding inflammatory change. Vascular/Lymphatic: There are no enlarged abdominal lymph nodes. No evidence of acute vascular thrombosis or aneurysm. Other: No other intra-abdominal fluid collections are identified. No ascites. Musculoskeletal: No acute or significant osseous findings. IMPRESSION: 1. Stable size of large complex right perinephric fluid collection with heterogeneous peripheral enhancement following contrast, highly suspicious for an infected fluid collection. Differential considerations include infected urinoma and infected biloma given the patient's recent biliary intervention. As noted above, this collection is new from 01/15/2022 and not significantly changed in size from the most recent noncontrast abdominal CTs. 2. No hydronephrosis or significant biliary dilatation. 3. Small right-greater-than-left pleural effusions with associated bibasilar atelectasis. Electronically Signed   By: WCaryl ComesD.  On: 01/28/2022 19:05    ? ?Imaging independently reviewed in Epic.  ? ? ?Mignon Pine ?[facility] ?[place] ?[phone] pager ?01/30/2022, 9:55 AM ? ? ?

## 2022-01-30 NOTE — Progress Notes (Signed)
[facility] Progress Note ? ?Laurie Terry 56 y.o. 1965/12/25 ? ?CC:  Intra-abdominal infection ? ? ?Subjective: ?Patient seen and examined sitting in chair, she is accompanied by her mother in the room.  She is n.p.o. for IR CT guided percutaneous aspiration/drainage of right subhepatic/perinephric fluid collection.  She notes she is having some left mid back pain with deep inspiration starting today.  Reports febrile episode overnight Tmax 100.3.  Continues to have some abdominal spasms with drinking liquids. ? ?ROS : Review of Systems  ?Constitutional:  Positive for chills and fever.  ?Respiratory:  Positive for cough.   ?Gastrointestinal:  Positive for abdominal pain. Negative for blood in stool, constipation, diarrhea, heartburn, melena, nausea and vomiting.  ?Genitourinary:  Negative for dysuria and urgency.   ? ? ?Objective: ?Vital signs in last 24 hours: ?Vitals:  ? 01/29/22 2239 01/30/22 0546  ?BP:  96/62  ?Pulse:  76  ?Resp:  18  ?Temp: 100 ?F (37.8 ?C) 98.8 ?F (37.1 ?C)  ?SpO2:  95%  ? ? ?Physical Exam: ? ?General:  Alert, cooperative, no distress, appears stated age  ?Head:  Normocephalic, without obvious abnormality, atraumatic  ?Eyes:  Anicteric sclera, EOM's intact  ?Lungs:   Clear to auscultation bilaterally, respirations unlabored  ?Heart:  Regular rate and rhythm, S1, S2 normal  ?Abdomen:   Soft, non-tender, bowel sounds active all four quadrants,  no masses,   ?Extremities: Extremities normal, atraumatic, no  edema  ?Pulses: 2+ and symmetric  ? ? ?Lab Results: ?Recent Labs  ?  01/29/22 ?0401 01/30/22 ?0401  ?NA 137 140  ?K 3.6 3.8  ?CL 104 105  ?CO2 23 27  ?GLUCOSE 90 97  ?BUN 8 7  ?CREATININE 0.55 0.60  ?CALCIUM 8.2* 8.3*  ? ?Recent Labs  ?  01/29/22 ?0401 01/30/22 ?0401  ?AST 11* 11*  ?ALT 17 15  ?ALKPHOS 67 67  ?BILITOT 0.5 0.5  ?PROT 5.9* 6.0*  ?ALBUMIN 2.7* 2.6*  ? ?Recent Labs  ?  01/27/22 ?1834 01/28/22 ?9509 01/29/22 ?0401 01/30/22 ?0401  ?WBC 19.7*   < > 18.3* 13.6*   ?NEUTROABS 15.7*  --   --   --   ?HGB 12.3   < > 11.0* 11.2*  ?HCT 36.4   < > 35.0* 34.3*  ?MCV 89.2   < > 90.0 90.5  ?PLT 430*   < > 518* 535*  ? < > = values in this interval not displayed.  ? ?Recent Labs  ?  01/29/22 ?1410  ?LABPROT 13.8  ?INR 1.1  ? ? ? ? ?Assessment ?Intra-abdominal infection ?  ?CT abdomen pelvis without contrast 01/27/2022 ?1. No significant interval change in the right subhepatic and perinephric fluid collection compared to prior CT. 2. Small bilateral pleural effusions similar to prior CT.  A 3. Punctate nonobstructing right renal upper pole stone. No hydronephrosis. 4. No bowel obstruction. Normal appendix. ?  ?MRCP with and without contrast 01/28/2022 ?1. Stable size of large complex right perinephric fluid collection ?with heterogeneous peripheral enhancement following contrast, highly suspicious for an infected fluid collection. this ?collection is new from 01/15/2022 and not significantly changed in ?size from the most recent noncontrast abdominal CTs. ?2. No hydronephrosis or significant biliary dilatation. ?3. Small right-greater-than-left pleural effusions with associated ?bibasilar atelectasis. ?  ?White blood cells elevated at 19.7 on admission, 13.6 today. 01/25/2022 WBC  11.4.  Patient also had possible reactive thrombocytosis platelets now at 518. ?  ?Hemoglobin stable at 11.2.  ?  ?Diarrhea resolved.   ? ?  Blood cultures in process, will follow.  At this time no growth after 48 hr. ?  ?She is currently on ceftriaxone and Flagyl.  ID recommending PICC line placement for outpatient antibiotics.  ? ?Right upper quadrant pain resolved at this time.  Stomach cramping partially improved with dicyclomine. ? ? ?Plan: ?Patient planned for CT-guided percutaneous drainage of intra-abdominal infected fluid with IR.  ?ID recommending PICC line placement for outpatient antibiotic therapy.  Inpatient antibiotic therapy per ID recommendation. ?Abdominal pain and cramping partially improved with  dicyclomine 10 mg up to 4 times daily as needed.  Will continue. ?Diarrhea resolved may use cholestyramine as needed if diarrhea returns. ?[facility] will follow ? ?Charlott Rakes PA-C ?01/30/2022, 11:14 AM ? ?Contact #  [phone]  ?

## 2022-01-30 NOTE — Progress Notes (Signed)
?[facility] Progress Note ? ?Patient: Laurie Terry     ?HYW:[id number]  ?DOA: 01/27/2022   ?PCP: [name], MD  ? ?  ?  ?Brief hospital course: ?This is a 56 year old female with GERD, history of pancreatitis, thoracic aortic aneurysm, hyperlipidemia, paroxysmal SVT, who presents to the hospital for a fever of 101.5 and a right-sided lower chest upper abdominal pain along with diarrhea. ?The patient was hospitalized from April 25 through May 6 and underwent an ERCP which was complicated by microperforation and fluid collection in the gallbladder fossa.  She was also noted to have a reactive pleural effusion.  The patient was treated with antibiotics and discharged home on 5/6 on Augmentin. ?In the ED, the patient's WBC count was 19.7 CT of the abdomen and pelvis showed a fluid collection in the right subhepatic perinephric area which was essentially unchanged from previous imaging, small bilateral pleural effusions, punctate nonobstructing right upper pole stone. ?The patient was admitted and started on broad-spectrum IV antibiotics once again.  GI was consulted and it was recommended that the patient undergo IR drainage.  ID was consulted as well. ? ?Subjective:  ?Patient has ongoing right-sided abdominal pain but also has a cough that has been progressing for a few days and is quite severe today.  She feels that it is a dry cough and despite taking Tessalon Perles has not improved. ? ?Assessment and Plan: ?Principal Problem: ?  Intra-abdominal infection ?-Active to have a biloma that was subsequently infected ?- Has been evaluated by IR and has undergone CT-guided right retroperitoneal drain placement with 30 cc of purulent fluid that has been sent for Gram stain and culture ?-Will continue ceftriaxone and Flagyl and await culture results ?- ID recommends placing a PICC line for outpatient IV antibiotics ? ?Active Problems: ?Cough ?- Dry cough is likely secondary to diaphragmatic irritation due  to the above-mentioned infection ?- Most likely will improve after the area is adequately drained ?--Have ordered Tussionex for now ? ?  Paroxysmal SVT (supraventricular tachycardia) (HCC) ?-Follow on telemetry ? ?  GERD without esophagitis ?-Continue PPI ? ?  Pleural effusion, bilateral ?-To be reactive to abdominal process ?-follow ? ?Diarrhea ?- Has resolved-although cholestyramine was ordered, the patient has not taken it as her diarrhea has resolved ?  ?DVT prophylaxis:  enoxaparin (LOVENOX) injection 40 mg Start: 01/31/22 1000 ? ?  Code Status: Full Code  ?Consultants: GI, IR, ID ?Level of Care: Level of care: Telemetry ?Disposition Plan:  ?Status is: Inpatient ?Remains inpatient appropriate because: Ongoing treatment for infection ? ?Objective: ?  ?Vitals:  ? 01/30/22 1410 01/30/22 1415 01/30/22 1420 01/30/22 1427  ?BP: (!) 103/57 (!) 102/53 (!) 104/51 (!) 106/52  ?Pulse: 83 82 82 80  ?Resp: '11 11 15 14  '$ ?Temp:      ?TempSrc:      ?SpO2: 100% 100% 100% 100%  ?Weight:      ?Height:      ? ?Filed Weights  ? 01/27/22 1710  ?Weight: 68 kg  ? ?Exam: ?General exam: Appears comfortable  ?HEENT: PERRLA, oral mucosa moist, no sclera icterus or thrush ?Respiratory system: Clear to auscultation.  Persistent cough-respiratory effort normal. ?Cardiovascular system: S1 & S2 heard, regular rate and rhythm ?Gastrointestinal system: Abdomen soft, right abdominal tenderness, nondistended. Normal bowel sounds   ?Central nervous system: Alert and oriented. No focal neurological deficits. ?Extremities: No cyanosis, clubbing or edema ?Skin: No rashes or ulcers ?Psychiatry:  Mood & affect appropriate.   ? ?Imaging and  lab data was personally reviewed ? ? ? CBC: ?Recent Labs  ?Lab 01/25/22 ?3419 01/27/22 ?1834 01/28/22 ?6222 01/29/22 ?0401 01/30/22 ?0401  ?WBC 11.4* 19.7* 19.9* 18.3* 13.6*  ?NEUTROABS  --  15.7*  --   --   --   ?HGB 9.7* 12.3 10.5* 11.0* 11.2*  ?HCT 29.8* 36.4 33.0* 35.0* 34.3*  ?MCV 89.2 89.2 89.7 90.0 90.5  ?PLT  369 430* 463* 518* 535*  ? ?Basic Metabolic Panel: ?Recent Labs  ?Lab 01/26/22 ?0805 01/27/22 ?1834 01/28/22 ?9798 01/29/22 ?0401 01/30/22 ?0401  ?NA 138 140 140 137 140  ?K 3.7 3.7 3.4* 3.6 3.8  ?CL 100 104 106 104 105  ?CO2 '29 27 26 23 27  '$ ?GLUCOSE 101* 104* 102* 90 97  ?BUN '7 8 6 8 7  '$ ?CREATININE 0.58 0.53 0.46 0.55 0.60  ?CALCIUM 8.4* 8.5* 8.3* 8.2* 8.3*  ? ?GFR: ?Estimated Creatinine Clearance: 74.4 mL/min (by C-G formula based on SCr of 0.6 mg/dL). ? ?Scheduled Meds: ? chlorpheniramine-HYDROcodone  5 mL Oral Q12H  ? cholestyramine light  4 g Oral BID  ? diclofenac Sodium  4 g Topical QID  ? dicyclomine  10 mg Oral TID AC & HS  ? [START ON 01/31/2022] enoxaparin (LOVENOX) injection  40 mg Subcutaneous Q24H  ? sodium chloride flush  3 mL Intravenous Q12H  ? sodium chloride flush  5 mL Intracatheter Q8H  ? ?Continuous Infusions: ? cefTRIAXone (ROCEPHIN)  IV 2 g (01/30/22 0126)  ? metronidazole 500 mg (01/30/22 1528)  ? ? ? LOS: 1 day  ? ?Author: ?Debbe Odea  ?01/30/2022 4:32 PM ?   ?

## 2022-01-31 ENCOUNTER — Other Ambulatory Visit (HOSPITAL_COMMUNITY): Payer: Self-pay

## 2022-01-31 ENCOUNTER — Other Ambulatory Visit: Payer: Self-pay | Admitting: Radiology

## 2022-01-31 ENCOUNTER — Inpatient Hospital Stay: Payer: Self-pay

## 2022-01-31 DIAGNOSIS — B999 Unspecified infectious disease: Secondary | ICD-10-CM

## 2022-01-31 DIAGNOSIS — K219 Gastro-esophageal reflux disease without esophagitis: Secondary | ICD-10-CM | POA: Diagnosis not present

## 2022-01-31 DIAGNOSIS — R509 Fever, unspecified: Secondary | ICD-10-CM | POA: Diagnosis not present

## 2022-01-31 DIAGNOSIS — K659 Peritonitis, unspecified: Secondary | ICD-10-CM | POA: Diagnosis not present

## 2022-01-31 LAB — RESPIRATORY PANEL BY PCR

## 2022-01-31 MED ORDER — DEXTROMETHORPHAN POLISTIREX ER 30 MG/5ML PO SUER
30.0000 mg | Freq: Two times a day (BID) | ORAL | Status: DC
  Administered 2022-01-31 – 2022-02-02 (×5): 30 mg via ORAL
  Filled 2022-01-31 (×6): qty 5

## 2022-01-31 MED ORDER — AMOXICILLIN-POT CLAVULANATE 875-125 MG PO TABS
1.0000 | ORAL_TABLET | Freq: Two times a day (BID) | ORAL | 0 refills | Status: DC
  Filled 2022-01-31: qty 28, 14d supply, fill #0

## 2022-01-31 MED ORDER — HYDROCOD POLI-CHLORPHE POLI ER 10-8 MG/5ML PO SUER
5.0000 mL | Freq: Every day | ORAL | Status: DC
  Filled 2022-01-31: qty 5

## 2022-01-31 NOTE — Progress Notes (Signed)
?   ? ?[facility] ? ?Date of Admission:  01/27/2022    ?       ?Reason for visit: Follow up on abscess ? ?Current antibiotics: ?Ceftriaxone ?Flagyl ? ?ASSESSMENT:   ? ?56 y.o. female admitted with: ? ?Subhepatic/perinephric abscess:  MRI abdomen 5/8 with contrast identified a stable in size, large complex fluid collection measuring about 5cm.  Underwent IR drain placement via CT guidance 5/10.  Gram stain is notable for GPC in pairs. ?Hx of gallstone pancreatitis s/p lap chole in July 2017:  This was complicated by postoperative fluid collection in the GB fossa (3.8x0.9x4.3cm) that was aspirated and had negative cultures.  She had a surgical JP drain at that time but no abscess drain placed. She received antibiotics during that admission but no further abx at discharge.  ?Sepsis: WBC improved yesterday.  No new labs today.  Afebrile.  ? ?RECOMMENDATIONS:   ? ?Discussed antibiotic options with patient.  She would prefer to use oral therapy if possible.  I think this is reasonable as she has had about 2 weeks of IV therapy thus far and has now undergone more adequate source control with drain placement ?Discussed with micro.  Appears c/w beta hemolysis and suspect a Strep spp.  Potential as well for enterococcus based on gram stain ?Plan for Augmentin 875 BID at discharge.  Can adjust if needed based on susceptibilities. ?Drain management and follow up imaging per IR ?Will arrange outpatient follow up as well.  Anticipate antibiotics for duration of drain in place plus a few extra days. ?Okay to DC from ID standpoint if okay with GI and TRH ? ? ?Principal Problem: ?  Intra-abdominal infection ?Active Problems: ?  Paroxysmal SVT (supraventricular tachycardia) (HCC) ?  Other hyperlipidemia ?  GERD without esophagitis ?  Pleural effusion, bilateral ? ? ? ?MEDICATIONS:   ? ?Scheduled Meds: ? chlorpheniramine-HYDROcodone  5 mL Oral Q12H  ? cholestyramine light  4 g Oral BID  ? diclofenac Sodium   4 g Topical QID  ? dicyclomine  10 mg Oral TID AC & HS  ? enoxaparin (LOVENOX) injection  40 mg Subcutaneous Q24H  ? sodium chloride flush  3 mL Intravenous Q12H  ? sodium chloride flush  5 mL Intracatheter Q8H  ? ?Continuous Infusions: ? cefTRIAXone (ROCEPHIN)  IV 2 g (01/30/22 2321)  ? metronidazole 500 mg (01/31/22 0523)  ? ?PRN Meds:.acetaminophen **OR** acetaminophen, albuterol, benzonatate, EPINEPHrine, famotidine, HYDROcodone-acetaminophen, metoprolol tartrate ? ?SUBJECTIVE:  ? ?24 hour events:  ?No acute events noted overnight ?Underwent drain placement yesterday ?No other new imaging ?No other labs today ? ?She is feeling well today.  Slowly improving daily.  Diarrhea symptoms stable and tolerating PO.  No issues noted with antibiotics.  ? ?Review of Systems  ?All other systems reviewed and are negative. ? ?  ?OBJECTIVE:  ? ?Blood pressure (!) 100/59, pulse 81, temperature 98.6 ?F (37 ?C), temperature source Oral, resp. rate 16, height '5\' 4"'$  (1.626 m), weight 68 kg, SpO2 96 %. ?Body mass index is 25.75 kg/m?. ? ?Physical Exam ?Constitutional:   ?   Appearance: Normal appearance.  ?HENT:  ?   Head: Normocephalic and atraumatic.  ?Eyes:  ?   Extraocular Movements: Extraocular movements intact.  ?   Conjunctiva/sclera: Conjunctivae normal.  ?Pulmonary:  ?   Effort: Pulmonary effort is normal. No respiratory distress.  ?Abdominal:  ?   General: There is no distension.  ?   Palpations: Abdomen is soft.  ?  Comments: Drain in place with purulent fluid.   ?Musculoskeletal:     ?   General: Normal range of motion.  ?   Cervical back: Normal range of motion and neck supple.  ?Skin: ?   General: Skin is warm and dry.  ?   Findings: No rash.  ?Neurological:  ?   General: No focal deficit present.  ?   Mental Status: She is alert and oriented to person, place, and time.  ?Psychiatric:     ?   Mood and Affect: Mood normal.     ?   Behavior: Behavior normal.  ? ? ? ?Lab Results: ?Lab Results  ?Component Value Date  ?  WBC 13.6 (H) 01/30/2022  ? HGB 11.2 (L) 01/30/2022  ? HCT 34.3 (L) 01/30/2022  ? MCV 90.5 01/30/2022  ? PLT 535 (H) 01/30/2022  ?  ?Lab Results  ?Component Value Date  ? NA 140 01/30/2022  ? K 3.8 01/30/2022  ? CO2 27 01/30/2022  ? GLUCOSE 97 01/30/2022  ? BUN 7 01/30/2022  ? CREATININE 0.60 01/30/2022  ? CALCIUM 8.3 (L) 01/30/2022  ? GFRNONAA >60 01/30/2022  ? GFRAA >60 04/17/2016  ?  ?Lab Results  ?Component Value Date  ? ALT 15 01/30/2022  ? AST 11 (L) 01/30/2022  ? ALKPHOS 67 01/30/2022  ? BILITOT 0.5 01/30/2022  ? ? ?No results found for: CRP ? ?   ?Component Value Date/Time  ? ESRSEDRATE 2 11/28/2020 1000  ? ?  ?I have reviewed the micro and lab results in Epic. ? ?Imaging: ?CT IMAGE GUIDED DRAINAGE BY PERCUTANEOUS CATHETER ? ?Result Date: 01/30/2022 ?CLINICAL DATA:  Right upper quadrant abdominal pain. Right perinephric fluid collection on CT, 1st demonstrated 01/21/2022, new from previous CT 01/15/2022. Intra-abdominal abscess suspected following ERCP. Stable size of large complex right perinephric fluid collection on MR, with heterogeneous peripheral enhancement following contrast, highly suspicious for an infected fluid collection. Differential considerations include infected urinoma and infected biloma given the patient's recent biliary intervention. EXAM: CT GUIDED DRAINAGE OF RETROPERITONEAL ABSCESS ANESTHESIA/SEDATION: Intravenous Fentanyl 30mg and Versed 0.'5mg'$  were administered as conscious sedation during continuous monitoring of the patient's level of consciousness and physiological / cardiorespiratory status by the radiology RN, with a total moderate sedation time of 18 minutes. PROCEDURE: The procedure, risks, benefits, and alternatives were explained to the patient. Questions regarding the procedure were encouraged and answered. The patient understands and consents to the procedure. Patient placed prone. Select axial scans through the mid abdomen were obtained. The right retroperitoneal  collection was localized and an appropriate skin site was determined and marked. Under CT fluoroscopic guidance, 18 gauge trocar needle advanced into the collection. Purulent material could be aspirated. Amplatz wire advanced easily, position confirmed on CT. Tract dilated to facilitate placement 12 French pigtail drain catheter, formed centrally within the collection. Position confirmed on CT. Approximately 20 mL of purulent material were aspirated and sent for Gram stain and culture. Catheter secured externally with 0 Prolene suture and StatLock and placed to external gravity drain bag The patient tolerated the procedure well. RADIATION DOSE REDUCTION: This exam was performed according to the departmental dose-optimization program which includes automated exposure control, adjustment of the mA and/or kV according to patient size and/or use of iterative reconstruction technique. COMPLICATIONS: None immediate FINDINGS: Multiloculated right retroperitoneal collection was localized. 12 French pigtail drain catheter placed into the inferior dominant component. 20 mL purulent aspirate sent for Gram stain and culture. IMPRESSION: Technically successful CT-guided retroperitoneal abscess drain catheter  placement. Electronically Signed   By: Lucrezia Europe M.D.   On: 01/30/2022 16:00    ? ?Imaging independently reviewed in Epic.  ? ? ?Mignon Pine ?[facility] ?[place] ?[phone] pager ?01/31/2022, 8:31 AM ? ?I have personally spent 35 minutes involved in face-to-face and non-face-to-face activities for this patient on the day of the visit. Professional time spent includes the following activities: Preparing to see the patient (review of tests), Obtaining and/or reviewing separately obtained history (admission/discharge record), Performing a medically appropriate examination and/or evaluation , Ordering medications/tests/procedures, referring and communicating with other health  care professionals, Documenting clinical information in the EMR, Independently interpreting results (not separately reported), Communicating results to the patient/family/caregiver, Counseling and educating the

## 2022-01-31 NOTE — Progress Notes (Signed)
?[facility] Progress Note ? ?Patient: Laurie Terry     ?AJG:[id number]  ?DOA: 01/27/2022   ?PCP: [name], MD  ? ?  ?  ?Brief hospital course: ?This is a 56 year old female with GERD, history of pancreatitis, thoracic aortic aneurysm, hyperlipidemia, paroxysmal SVT, who presents to the hospital for a fever of 101.5 and a right-sided lower chest upper abdominal pain along with diarrhea. ?The patient was hospitalized from April 25 through May 6 and underwent an ERCP which was complicated by microperforation and fluid collection in the gallbladder fossa.  She was also noted to have a reactive pleural effusion.  The patient was treated with antibiotics and discharged home on 5/6 on Augmentin. ?In the ED, the patient's WBC count was 19.7 CT of the abdomen and pelvis showed a fluid collection in the right subhepatic perinephric area which was essentially unchanged from previous imaging, small bilateral pleural effusions, punctate nonobstructing right upper pole stone. ?The patient was admitted and started on broad-spectrum IV antibiotics once again.  GI was consulted and it was recommended that the patient undergo IR drainage.  ID was consulted as well. ? ?Subjective:  ?Right sided abdominal pain has improved after drain placement but left back pain has not and neither has her cough. When ambulating in the hall, her cough increased. Cough is still dry.  ? ?Assessment and Plan: ?Principal Problem: ?  Intra-abdominal infection ?-Active to have a biloma that was subsequently infected ?- Has been evaluated by IR and has undergone CT-guided right retroperitoneal drain placement with 30 cc of purulent fluid that has been sent for Gram stain and culture ?-Will continue ceftriaxone and Flagyl   ?- ID recommends oral antibiotics when she is discharged ?- culture from abscess shows no WBC and some gram positive cocci ? ?Active Problems: ?Cough ?- Dry cough is likely secondary to diaphragmatic irritation due to  the above-mentioned infection and has not improved despite drainage- actually worsens with ambulation (possibly due to diaphragmatic effort) ?- check resp panel today ?- add Dextromethorphan today- she states the Tussionex sedates her and prefers it only at bedtime ? ?  Paroxysmal SVT (supraventricular tachycardia) (HCC) ?- no further occurences documented ?- dc telemetry ? ?  GERD without esophagitis ?-Continue PPI ? ?  Pleural effusion, bilateral ?- felt to be reactive to abdominal process ?-follow ?  ?  ?DVT prophylaxis:  enoxaparin (LOVENOX) injection 40 mg Start: 01/31/22 1000 ? ?  Code Status: Full Code  ?Consultants: GI, IR, ID ?Level of Care: Level of care: Telemetry ?Disposition Plan:  ?Status is: Inpatient ?Remains inpatient appropriate because: Ongoing treatment for infection ? ?Objective: ?  ?Vitals:  ? 01/30/22 1427 01/30/22 2112 01/31/22 0536 01/31/22 1419  ?BP: (!) 106/52 108/63 (!) 100/59 107/67  ?Pulse: 80 95 81 86  ?Resp: '14 16 16 18  '$ ?Temp:  99.1 ?F (37.3 ?C) 98.6 ?F (37 ?C) 99.1 ?F (37.3 ?C)  ?TempSrc:  Oral Oral Oral  ?SpO2: 100% 96% 96% 98%  ?Weight:      ?Height:      ? ?Filed Weights  ? 01/27/22 1710  ?Weight: 68 kg  ? ?Exam: ?General exam: Appears comfortable  ?HEENT: PERRLA, oral mucosa moist, no sclera icterus or thrush ?Respiratory system: Clear to auscultation. Persistent cough- Respiratory effort normal. ?Cardiovascular system: S1 & S2 heard, regular rate and rhythm ?Gastrointestinal system: Abdomen soft, mild tenderness in RUQ, Normal bowel sounds   ?Back: left flank tenderness noted on exam- right flank drain noted ?Central nervous system: Alert  and oriented. No focal neurological deficits. ?Extremities: No cyanosis, clubbing or edema ?Skin: No rashes or ulcers ?Psychiatry:  Mood & affect appropriate.   ? ?Imaging and lab data was personally reviewed ? ? ? CBC: ?Recent Labs  ?Lab 01/25/22 ?7681 01/27/22 ?1834 01/28/22 ?1572 01/29/22 ?0401 01/30/22 ?0401  ?WBC 11.4* 19.7* 19.9* 18.3*  13.6*  ?NEUTROABS  --  15.7*  --   --   --   ?HGB 9.7* 12.3 10.5* 11.0* 11.2*  ?HCT 29.8* 36.4 33.0* 35.0* 34.3*  ?MCV 89.2 89.2 89.7 90.0 90.5  ?PLT 369 430* 463* 518* 535*  ? ? ?Basic Metabolic Panel: ?Recent Labs  ?Lab 01/26/22 ?0805 01/27/22 ?1834 01/28/22 ?6203 01/29/22 ?0401 01/30/22 ?0401  ?NA 138 140 140 137 140  ?K 3.7 3.7 3.4* 3.6 3.8  ?CL 100 104 106 104 105  ?CO2 '29 27 26 23 27  '$ ?GLUCOSE 101* 104* 102* 90 97  ?BUN '7 8 6 8 7  '$ ?CREATININE 0.58 0.53 0.46 0.55 0.60  ?CALCIUM 8.4* 8.5* 8.3* 8.2* 8.3*  ? ? ?GFR: ?Estimated Creatinine Clearance: 74.4 mL/min (by C-G formula based on SCr of 0.6 mg/dL). ? ?Scheduled Meds: ? [START ON 02/01/2022] chlorpheniramine-HYDROcodone  5 mL Oral QHS  ? cholestyramine light  4 g Oral BID  ? dextromethorphan  30 mg Oral BID  ? diclofenac Sodium  4 g Topical QID  ? dicyclomine  10 mg Oral TID AC & HS  ? enoxaparin (LOVENOX) injection  40 mg Subcutaneous Q24H  ? sodium chloride flush  3 mL Intravenous Q12H  ? sodium chloride flush  5 mL Intracatheter Q8H  ? ?Continuous Infusions: ? cefTRIAXone (ROCEPHIN)  IV 2 g (01/30/22 2321)  ? metronidazole 500 mg (01/31/22 0523)  ? ? ? LOS: 2 days  ? ?Author: ?Debbe Odea  ?01/31/2022 2:50 PM ?   ?

## 2022-01-31 NOTE — Progress Notes (Signed)
? ? ?Chief Complaint: ?Patient was seen today for right retroperitoneal drain ? ?Supervising Physician: Aletta Edouard ? ?Patient Status: [facility] - In-pt ? ?Subjective: ?S/p perc drain to Right RP fluid collection/abscess. ?Pt feels okay, sitting up in chair. ?Some soreness at drain site. ? ?Objective: ?Physical Exam: ?BP (!) 100/59 (BP Location: Right Arm)   Pulse 81   Temp 98.6 ?F (37 ?C) (Oral)   Resp 16   Ht '5\' 4"'$  (1.626 m)   Wt 68 kg   SpO2 96%   BMI 25.75 kg/m?  ?Drain intact, site clean, dry, NT. ?Output cloudy yellow. ?40 mL recorded past 24 hrs ? ? ?Current Facility-Administered Medications:  ?  acetaminophen (TYLENOL) tablet 650 mg, 650 mg, Oral, Q6H PRN, 650 mg at 01/30/22 1955 **OR** acetaminophen (TYLENOL) suppository 650 mg, 650 mg, Rectal, Q6H PRN, Marcelyn Bruins, MD ?  albuterol (PROVENTIL) (2.5 MG/3ML) 0.083% nebulizer solution 3 mL, 3 mL, Inhalation, Q4H PRN, Marcelyn Bruins, MD ?  cefTRIAXone (ROCEPHIN) 2 g in sodium chloride 0.9 % 100 mL IVPB, 2 g, Intravenous, Q24H, Marcelyn Bruins, MD, Last Rate: 200 mL/hr at 01/30/22 2321, 2 g at 01/30/22 2321 ?  [START ON 02/01/2022] chlorpheniramine-HYDROcodone 10-8 MG/5ML suspension 5 mL, 5 mL, Oral, QHS, Rizwan, Saima, MD ?  cholestyramine light (PREVALITE) packet 4 g, 4 g, Oral, BID, Marcelyn Bruins, MD, 4 g at 01/30/22 5329 ?  dextromethorphan (DELSYM) 30 MG/5ML liquid 30 mg, 30 mg, Oral, BID, Rizwan, Saima, MD ?  diclofenac Sodium (VOLTAREN) 1 % topical gel 4 g, 4 g, Topical, QID, Rizwan, Saima, MD, 4 g at 01/31/22 0912 ?  dicyclomine (BENTYL) capsule 10 mg, 10 mg, Oral, TID AC & HS, Scheck, Gabrielle N, PA-C, 10 mg at 01/31/22 1150 ?  enoxaparin (LOVENOX) injection 40 mg, 40 mg, Subcutaneous, Q24H, Allred, Darrell K, PA-C, 40 mg at 01/31/22 0912 ?  EPINEPHrine (EPI-PEN) injection 0.3 mg, 0.3 mg, Intramuscular, PRN, Marcelyn Bruins, MD ?  famotidine (PEPCID) tablet 20 mg, 20 mg, Oral, Daily PRN, Marcelyn Bruins, MD ?   HYDROcodone-acetaminophen (NORCO/VICODIN) 5-325 MG per tablet 1-2 tablet, 1-2 tablet, Oral, Q4H PRN, Arne Cleveland, MD ?  metoprolol tartrate (LOPRESSOR) tablet 25 mg, 25 mg, Oral, Daily PRN, Marcelyn Bruins, MD ?  metroNIDAZOLE (FLAGYL) IVPB 500 mg, 500 mg, Intravenous, Q12H, Malvin Johns, MD, Last Rate: 100 mL/hr at 01/31/22 0523, 500 mg at 01/31/22 0523 ?  sodium chloride flush (NS) 0.9 % injection 3 mL, 3 mL, Intravenous, Q12H, Marcelyn Bruins, MD, 3 mL at 01/31/22 0912 ?  sodium chloride flush (NS) 0.9 % injection 5 mL, 5 mL, Intracatheter, Q8H, Arne Cleveland, MD, 5 mL at 01/30/22 2154 ? ?Labs: ?CBC ?Recent Labs  ?  01/29/22 ?0401 01/30/22 ?0401  ?WBC 18.3* 13.6*  ?HGB 11.0* 11.2*  ?HCT 35.0* 34.3*  ?PLT 518* 535*  ? ?BMET ?Recent Labs  ?  01/29/22 ?0401 01/30/22 ?0401  ?NA 137 140  ?K 3.6 3.8  ?CL 104 105  ?CO2 23 27  ?GLUCOSE 90 97  ?BUN 8 7  ?CREATININE 0.55 0.60  ?CALCIUM 8.2* 8.3*  ? ?LFT ?Recent Labs  ?  01/30/22 ?0401  ?PROT 6.0*  ?ALBUMIN 2.6*  ?AST 11*  ?ALT 15  ?ALKPHOS 67  ?BILITOT 0.5  ? ?PT/INR ?Recent Labs  ?  01/29/22 ?1410  ?LABPROT 13.8  ?INR 1.1  ? ? ? ?Studies/Results: ?CT IMAGE GUIDED DRAINAGE BY PERCUTANEOUS CATHETER ? ?Result Date: 01/30/2022 ?CLINICAL DATA:  Right upper quadrant  abdominal pain. Right perinephric fluid collection on CT, 1st demonstrated 01/21/2022, new from previous CT 01/15/2022. Intra-abdominal abscess suspected following ERCP. Stable size of large complex right perinephric fluid collection on MR, with heterogeneous peripheral enhancement following contrast, highly suspicious for an infected fluid collection. Differential considerations include infected urinoma and infected biloma given the patient's recent biliary intervention. EXAM: CT GUIDED DRAINAGE OF RETROPERITONEAL ABSCESS ANESTHESIA/SEDATION: Intravenous Fentanyl 23mg and Versed 0.'5mg'$  were administered as conscious sedation during continuous monitoring of the patient's level of consciousness and  physiological / cardiorespiratory status by the radiology RN, with a total moderate sedation time of 18 minutes. PROCEDURE: The procedure, risks, benefits, and alternatives were explained to the patient. Questions regarding the procedure were encouraged and answered. The patient understands and consents to the procedure. Patient placed prone. Select axial scans through the mid abdomen were obtained. The right retroperitoneal collection was localized and an appropriate skin site was determined and marked. Under CT fluoroscopic guidance, 18 gauge trocar needle advanced into the collection. Purulent material could be aspirated. Amplatz wire advanced easily, position confirmed on CT. Tract dilated to facilitate placement 12 French pigtail drain catheter, formed centrally within the collection. Position confirmed on CT. Approximately 20 mL of purulent material were aspirated and sent for Gram stain and culture. Catheter secured externally with 0 Prolene suture and StatLock and placed to external gravity drain bag The patient tolerated the procedure well. RADIATION DOSE REDUCTION: This exam was performed according to the departmental dose-optimization program which includes automated exposure control, adjustment of the mA and/or kV according to patient size and/or use of iterative reconstruction technique. COMPLICATIONS: None immediate FINDINGS: Multiloculated right retroperitoneal collection was localized. 12 French pigtail drain catheter placed into the inferior dominant component. 20 mL purulent aspirate sent for Gram stain and culture. IMPRESSION: Technically successful CT-guided retroperitoneal abscess drain catheter placement. Electronically Signed   By: DLucrezia EuropeM.D.   On: 01/30/2022 16:00  ? ?[place]KG SITE R[place] ? ?Result Date: 01/31/2022 ?If SOccidental Petroleumnot attached, placement could not be confirmed due to current cardiac rhythm.  ? ?Assessment/Plan: ?S/p perc drain to Rt RP collection. ?Cx pending, per ID  notes, to be discharged on po abx. ?Discussed drain care with pt, states mother should be able to help while husband out of town. ?Anticipate discharge tomorrow. ?Will arrange outpt follow up at IR clinic in next 1-2 weeks. ? ? ? LOS: 2 days  ? ?I spent a total of 20 minutes in face to face in clinical consultation, greater than 50% of which was counseling/coordinating care for Rt RP drain ? ?KAscencion DikePA-C ?01/31/2022 12:46 PM ? ?

## 2022-01-31 NOTE — Discharge Instructions (Signed)
Flush drain once daily with 5 mL sterile saline, reconnect to gravity bag. ?

## 2022-01-31 NOTE — Consult Note (Signed)
[facility] Progress Note ? ?Laurie Terry Fine 56 y.o. 01-06-1966 ? ?Subjective: ?Seen and examined sitting comfortably in chair.  Patient has a residual cough.  She is tolerating bland diet well.  Notes her abdominal cramping is worse when she drinks water but she is tolerating food well sometimes even without Bentyl. ? ?IR placed a drain yesterday.  40 mL recorded output in the last 24 hours. ? ?ROS : Review of Systems  ?Gastrointestinal:  Positive for abdominal pain. Negative for blood in stool, constipation, diarrhea, heartburn, melena, nausea and vomiting.  ?Genitourinary:  Negative for dysuria and urgency.  ? ? ? ?Objective: ?Vital signs in last 24 hours: ?Vitals:  ? 01/30/22 2112 01/31/22 0536  ?BP: 108/63 (!) 100/59  ?Pulse: 95 81  ?Resp: 16 16  ?Temp: 99.1 ?F (37.3 ?C) 98.6 ?F (37 ?C)  ?SpO2: 96% 96%  ? ? ?Physical Exam: ? ?General:  Alert, cooperative, no distress, appears stated age  ?Head:  Normocephalic, without obvious abnormality, atraumatic  ?Eyes:  Anicteric sclera, EOM's intact  ?Lungs:   Clear to auscultation bilaterally, respirations unlabored  ?Heart:  Regular rate and rhythm, S1, S2 normal  ?Abdomen:   Soft, non-tender, bowel sounds active all four quadrants,  no masses, retroperitoneal drain in place, collection bag containing approximately 50 mL of purulent fluid.  ?Extremities: Extremities normal, atraumatic, no  edema  ?Pulses: 2+ and symmetric  ? ? ?Lab Results: ?Recent Labs  ?  01/29/22 ?0401 01/30/22 ?0401  ?NA 137 140  ?K 3.6 3.8  ?CL 104 105  ?CO2 23 27  ?GLUCOSE 90 97  ?BUN 8 7  ?CREATININE 0.55 0.60  ?CALCIUM 8.2* 8.3*  ? ?Recent Labs  ?  01/29/22 ?0401 01/30/22 ?0401  ?AST 11* 11*  ?ALT 17 15  ?ALKPHOS 67 67  ?BILITOT 0.5 0.5  ?PROT 5.9* 6.0*  ?ALBUMIN 2.7* 2.6*  ? ?Recent Labs  ?  01/29/22 ?0401 01/30/22 ?0401  ?WBC 18.3* 13.6*  ?HGB 11.0* 11.2*  ?HCT 35.0* 34.3*  ?MCV 90.0 90.5  ?PLT 518* 535*  ? ?Recent Labs  ?  01/29/22 ?1410  ?LABPROT 13.8  ?INR 1.1   ? ? ? ? ?Assessment ?Subhepatic/perinephritic abscess ? ?Total drain output in the last 24 hours 40 mL. ?Fluid drained from IR procedure sent for Gram stain culture and sensitivities.  Preliminary results with rare gram-positive cocci in pairs. ? ?ID plans to send patient home on Augmentin 875.  Patient is currently on metronidazole 500 mg twice daily and ceftriaxone 2 g IV daily. ? ?From a GI standpoint patient's diarrhea has improved.  If she has intermittent diarrhea in the future recommend Imodium as needed.  Abdominal cramping well controlled on Bentyl.  May continue Bentyl outpatient. ? ? ?Plan: ?Continue supportive care ?Continue cholestyramine as needed for diarrhea while in the hospital.  May continue to use cholestyramine or Imodium for intermittent diarrhea outpatient.  ?Continue Bentyl 10 mg 3 times daily for abdominal cramping while in the hospital.  May discharge with Bentyl 10 mg 3 times daily. ?Antibiotics per ID recommendation. ?[facility] will sign off. ? ?Charlott Rakes PA-C ?01/31/2022, 1:49 PM ? ?Contact #  [phone] ? ?

## 2022-02-01 ENCOUNTER — Inpatient Hospital Stay: Payer: 59 | Admitting: Primary Care

## 2022-02-01 DIAGNOSIS — R509 Fever, unspecified: Secondary | ICD-10-CM | POA: Diagnosis not present

## 2022-02-01 DIAGNOSIS — B999 Unspecified infectious disease: Secondary | ICD-10-CM | POA: Diagnosis not present

## 2022-02-01 DIAGNOSIS — K219 Gastro-esophageal reflux disease without esophagitis: Secondary | ICD-10-CM | POA: Diagnosis not present

## 2022-02-01 DIAGNOSIS — K659 Peritonitis, unspecified: Secondary | ICD-10-CM | POA: Diagnosis not present

## 2022-02-01 LAB — BASIC METABOLIC PANEL
Anion gap: 7 (ref 5–15)
BUN: 11 mg/dL (ref 6–20)
CO2: 26 mmol/L (ref 22–32)
Calcium: 8.4 mg/dL — ABNORMAL LOW (ref 8.9–10.3)
Chloride: 105 mmol/L (ref 98–111)
Creatinine, Ser: 0.53 mg/dL (ref 0.44–1.00)
GFR, Estimated: >60 mL/min (ref >60)
Glucose, Bld: 93 mg/dL (ref 70–99)
Potassium: 3.9 mmol/L (ref 3.5–5.1)
Sodium: 138 mmol/L (ref 135–145)

## 2022-02-01 LAB — CREATININE, FLUID (PLEURAL, PERITONEAL, JP DRAINAGE): Creat, Fluid: <0.3 mg/dL

## 2022-02-01 LAB — CBC
HCT: 33.8 % — ABNORMAL LOW (ref 36.0–46.0)
Hemoglobin: 10.8 g/dL — ABNORMAL LOW (ref 12.0–15.0)
MCH: 28.6 pg (ref 26.0–34.0)
MCHC: 32 g/dL (ref 30.0–36.0)
MCV: 89.7 fL (ref 80.0–100.0)
Platelets: 541 10*3/uL — ABNORMAL HIGH (ref 150–400)
RBC: 3.77 MIL/uL — ABNORMAL LOW (ref 3.87–5.11)
RDW: 14.4 % (ref 11.5–15.5)
WBC: 10.7 10*3/uL — ABNORMAL HIGH (ref 4.0–10.5)
nRBC: 0 % (ref 0.0–0.2)

## 2022-02-01 MED ORDER — OXYCODONE HCL 5 MG PO TABS: 5.0000 mg | ORAL_TABLET | Freq: Four times a day (QID) | ORAL | Status: DC | PRN

## 2022-02-01 MED ORDER — AMOXICILLIN-POT CLAVULANATE 875-125 MG PO TABS
1.0000 | ORAL_TABLET | Freq: Two times a day (BID) | ORAL | Status: DC
  Administered 2022-02-01 – 2022-02-02 (×3): 1 via ORAL
  Filled 2022-02-01 (×3): qty 1

## 2022-02-01 MED ORDER — TRAMADOL HCL 50 MG PO TABS: 50.0000 mg | ORAL_TABLET | Freq: Four times a day (QID) | ORAL | Status: DC | PRN

## 2022-02-01 MED ORDER — FLUCONAZOLE 100 MG PO TABS
400.0000 mg | ORAL_TABLET | Freq: Every day | ORAL | Status: DC
  Administered 2022-02-01 – 2022-02-02 (×2): 400 mg via ORAL
  Filled 2022-02-01 (×2): qty 4

## 2022-02-01 NOTE — Progress Notes (Signed)
? ? ?Referring Physician(s): ?Karki,A/Wallace,A ? ?Supervising Physician: Ruthann Cancer ? ?Patient Status:  [facility] - In-pt ? ?Chief Complaint: ?Right upper abdominal pain/fluid collection ? ? ?Subjective: ?Patient states she feels about the same, has had some persistent temperature elevations, though currently afebrile; occasional cough ? ? ?Allergies: ?Cefepime, Contrast media [iodinated contrast media], Iodine, Other, Ampicillin, Avocado, Levofloxacin, Pantoprazole, Septra [bactrim], Sulfamethoxazole-trimethoprim, Vancomycin, and Colestipol hcl ? ?Medications: ?Prior to Admission medications   ?Medication Sig Start Date End Date Taking? Authorizing Provider  ?acetaminophen (TYLENOL) 500 MG tablet Take 1,000 mg by mouth every 6 (six) hours as needed for mild pain or fever.   Yes [provider]  ?albuterol (PROAIR HFA) 108 (90 Base) MCG/ACT inhaler Inhale 2 puffs into the lungs every 4 (four) hours as needed for wheezing or shortness of breath (coughing fits). 05/23/21  Yes Garnet Sierras, DO  ?cholestyramine light (PREVALITE) 4 g packet Take 1 packet (4 g total) by mouth 2 (two) times daily. 01/26/22  Yes Regalado, Belkys A, MD  ?eletriptan (RELPAX) 40 MG tablet 40 mg every 2 (two) hours as needed for migraine. 05/18/20  Yes [provider]  ?EPINEPHrine 0.3 mg/0.3 mL IJ SOAJ injection Inject 0.3 mg into the muscle as needed for anaphylaxis.   Yes [provider]  ?famotidine (PEPCID) 20 MG tablet Take 20 mg by mouth daily as needed for heartburn or indigestion.   Yes [provider]  ?fluticasone (FLONASE) 50 MCG/ACT nasal spray Place 2 sprays into both nostrils 2 (two) times daily as needed for allergies. 07/14/20  Yes [provider]  ?ketotifen (ZADITOR) 0.025 % ophthalmic solution Place 1 drop into both eyes daily as needed (Itching eye and allergies).   Yes [provider]  ?metoprolol tartrate (LOPRESSOR) 25 MG tablet Take 1 tablet (25 mg total) by mouth daily as  needed (svt). 01/26/22  Yes Regalado, Belkys A, MD  ?potassium chloride (KLOR-CON M) 10 MEQ tablet Take 1 tablet (10 mEq total) by mouth daily for 3 days. 01/26/22 01/29/22 Yes Regalado, Belkys A, MD  ?amoxicillin-clavulanate (AUGMENTIN) 875-125 MG tablet Take 1 tablet by mouth every 12 (twelve) hours for 14 days. 01/31/22 02/14/22  Mignon Pine, DO  ? ? ? ?Vital Signs: ?BP 101/63 (BP Location: Right Arm)   Pulse 91   Temp 98.9 ?F (37.2 ?C) (Oral)   Resp 19   Ht '5\' 4"'$  (1.626 m)   Wt 150 lb (68 kg)   SpO2 98%   BMI 25.75 kg/m?  ? ?Physical Exam awake/alert.  Right retroperitoneal drain intact, insertion site okay, mildly tender to palpation, output 30 cc turbid light brown-colored fluid ? ?Imaging: ?MR ABDOMEN MRCP W WO CONTAST ? ?Result Date: 01/28/2022 ?CLINICAL DATA:  Right upper quadrant abdominal pain. Right perinephric fluid collection on CT, 1st demonstrated 01/21/2022, new from previous CT 01/15/2022. Intra-abdominal abscess suspected following ERCP. EXAM: MRI ABDOMEN WITHOUT AND WITH CONTRAST (INCLUDING MRCP) TECHNIQUE: Multiplanar multisequence MR imaging of the abdomen was performed both before and after the administration of intravenous contrast. Heavily T2-weighted images of the biliary and pancreatic ducts were obtained, and three-dimensional MRCP images were rendered by post processing. CONTRAST:  82m GADAVIST GADOBUTROL 1 MMOL/ML IV SOLN COMPARISON:  Noncontrast abdominopelvic CT 01/27/2022, 01/24/2022, 01/21/2022 and 01/16/2022. Abdominal MRI 12/16/2021 head FINDINGS: Lower chest: Small right-greater-than-left pleural effusions, mildly improved from previous CTs and similar to yesterday's examination. Mild right lower lobe atelectasis. Hepatobiliary: The liver is normal in signal, without steatosis, abnormal enhancement or focal lesion. Status post  cholecystectomy. No significant biliary dilatation. In correlation with recent CT, mild pneumobilia, without evidence of choledocholithiasis. Pancreas:  Unremarkable. No pancreatic ductal dilatation or surrounding inflammatory changes. Spleen: Normal in size without focal abnormality. Adrenals/Urinary Tract: Both adrenal glands appear normal. Again demonstrated is a large complex multiloculated fluid collection in the subhepatic space which appears to involve the right perinephric space as well. This collection demonstrates no intrinsic T1 shortening to suggest hemorrhage. Following contrast, there is diffuse peripheral enhancement of this collection, suspicious for an infected fluid collection. Because of the shape of this collection, it is difficult to accurately measure, although there is a component superior to the right kidney which measures up to 5.2 x 3.1 cm on image 32/21. Inferior to the right kidney, there is a component measuring up to 4.4 x 5.7 cm on image 66/21. This collection is not significantly changed from the recent CT, and is new from 01/15/2022. No perinephric fluid collections are seen on the left. No evidence of hydronephrosis. Probable small incidental cyst in the interpolar region of the right kidney. Bladder not imaged. Stomach/Bowel: The stomach appears unremarkable for its degree of distension. No evidence of bowel wall thickening, distention or surrounding inflammatory change. Vascular/Lymphatic: There are no enlarged abdominal lymph nodes. No evidence of acute vascular thrombosis or aneurysm. Other: No other intra-abdominal fluid collections are identified. No ascites. Musculoskeletal: No acute or significant osseous findings. IMPRESSION: 1. Stable size of large complex right perinephric fluid collection with heterogeneous peripheral enhancement following contrast, highly suspicious for an infected fluid collection. Differential considerations include infected urinoma and infected biloma given the patient's recent biliary intervention. As noted above, this collection is new from 01/15/2022 and not significantly changed in size from the  most recent noncontrast abdominal CTs. 2. No hydronephrosis or significant biliary dilatation. 3. Small right-greater-than-left pleural effusions with associated bibasilar atelectasis. Electronically Signed   By: Richardean Sale M.D.   On: 01/28/2022 19:05  ? ?CT IMAGE GUIDED DRAINAGE BY PERCUTANEOUS CATHETER ? ?Result Date: 01/30/2022 ?CLINICAL DATA:  Right upper quadrant abdominal pain. Right perinephric fluid collection on CT, 1st demonstrated 01/21/2022, new from previous CT 01/15/2022. Intra-abdominal abscess suspected following ERCP. Stable size of large complex right perinephric fluid collection on MR, with heterogeneous peripheral enhancement following contrast, highly suspicious for an infected fluid collection. Differential considerations include infected urinoma and infected biloma given the patient's recent biliary intervention. EXAM: CT GUIDED DRAINAGE OF RETROPERITONEAL ABSCESS ANESTHESIA/SEDATION: Intravenous Fentanyl 14mg and Versed 0.'5mg'$  were administered as conscious sedation during continuous monitoring of the patient's level of consciousness and physiological / cardiorespiratory status by the radiology RN, with a total moderate sedation time of 18 minutes. PROCEDURE: The procedure, risks, benefits, and alternatives were explained to the patient. Questions regarding the procedure were encouraged and answered. The patient understands and consents to the procedure. Patient placed prone. Select axial scans through the mid abdomen were obtained. The right retroperitoneal collection was localized and an appropriate skin site was determined and marked. Under CT fluoroscopic guidance, 18 gauge trocar needle advanced into the collection. Purulent material could be aspirated. Amplatz wire advanced easily, position confirmed on CT. Tract dilated to facilitate placement 12 French pigtail drain catheter, formed centrally within the collection. Position confirmed on CT. Approximately 20 mL of purulent material  were aspirated and sent for Gram stain and culture. Catheter secured externally with 0 Prolene suture and StatLock and placed to external gravity drain bag The patient tolerated the procedure well. RADIATI

## 2022-02-01 NOTE — Progress Notes (Signed)
[facility] Progress Note ? ?Laurie Terry 56 y.o. Jul 04, 1966 ? ? ?Subjective: ?Patient seen and examined sitting in chair comfortably.  No she began to have a stomachache yesterday.  States her cramping is improved but now she feels a little nauseated.  Her famotidine was stopped yesterday. ? ?ROS : Review of Systems  ?Gastrointestinal:  Positive for nausea. Negative for abdominal pain, blood in stool, constipation, diarrhea, heartburn, melena and vomiting.  ?Genitourinary:  Negative for dysuria and urgency.   ? ? ?Objective: ?Vital signs in last 24 hours: ?Vitals:  ? 01/31/22 2034 02/01/22 1150  ?BP: 107/70 101/63  ?Pulse: 95 91  ?Resp: 19   ?Temp: 100.1 ?F (37.8 ?C) 98.9 ?F (37.2 ?C)  ?SpO2: 97% 98%  ? ? ?Physical Exam: ? ?General:  Alert, cooperative, no distress, appears stated age  ?Head:  Normocephalic, without obvious abnormality, atraumatic  ?Eyes:  Anicteric sclera, EOM's intact  ?Lungs:   Clear to auscultation bilaterally, respirations unlabored  ?Heart:  Regular rate and rhythm, S1, S2 normal  ?Abdomen:   Soft, non-tender, bowel sounds active all four quadrants,  no masses,   ?Extremities: Extremities normal, atraumatic, no  edema  ?Pulses: 2+ and symmetric  ? ? ?Lab Results: ?Recent Labs  ?  01/30/22 ?0401 02/01/22 ?0327  ?NA 140 138  ?K 3.8 3.9  ?CL 105 105  ?CO2 27 26  ?GLUCOSE 97 93  ?BUN 7 11  ?CREATININE 0.60 0.53  ?CALCIUM 8.3* 8.4*  ? ?Recent Labs  ?  01/30/22 ?0401  ?AST 11*  ?ALT 15  ?ALKPHOS 67  ?BILITOT 0.5  ?PROT 6.0*  ?ALBUMIN 2.6*  ? ?Recent Labs  ?  01/30/22 ?0401 02/01/22 ?0327  ?WBC 13.6* 10.7*  ?HGB 11.2* 10.8*  ?HCT 34.3* 33.8*  ?MCV 90.5 89.7  ?PLT 535* 541*  ? ?Recent Labs  ?  01/29/22 ?1410  ?LABPROT 13.8  ?INR 1.1  ? ? ? ? ?Assessment ?Subhepatic/perinephritic abscess ?  ?Total drain output in the last 24 hours 40 mL. ?Fluid drained from IR procedure sent for Gram stain culture and sensitivities.  Preliminary results with rare gram-positive cocci in pairs. ?  ?ID  plans to send patient home on Augmentin 875.  Patient is currently on metronidazole 500 mg twice daily and ceftriaxone 2 g IV daily. ?  ?From a GI standpoint patient's diarrhea has improved.  If she has intermittent diarrhea in the future recommend Imodium as needed.  Abdominal cramping well controlled on Bentyl.  May continue Bentyl outpatient. ? ?Possible nausea and dyspepsia due to stopping famotidine.  May need to continue famotidine outpatient. ? ? ?Plan: ?Continue supportive care ?Continue cholestyramine as needed for diarrhea while in the hospital.  May continue to use cholestyramine or Imodium for intermittent diarrhea outpatient.  ?Continue Bentyl 10 mg 3 times daily for abdominal cramping while in the hospital.  May discharge with Bentyl 10 mg 3 times daily. ?Continue famotidine 20 mg daily. ?Antibiotics per ID recommendation. ?[facility] will sign off. ? ?Charlott Rakes PA-C ?02/01/2022, 1:17 PM ? ?Contact #  [phone]  ?

## 2022-02-01 NOTE — Progress Notes (Signed)
?[facility] Progress Note ? ?Patient: Laurie Terry     ?JOI:[id number]  ?DOA: 01/27/2022   ?PCP: [name], MD  ? ?  ?  ?Brief hospital course: ?This is a 56 year old female with GERD, history of pancreatitis, thoracic aortic aneurysm, hyperlipidemia, paroxysmal SVT, who presents to the hospital for a fever of 101.5 and a right-sided lower chest upper abdominal pain along with diarrhea. ?The patient was hospitalized from April 25 through May 6 and underwent an ERCP which was complicated by microperforation and fluid collection in the gallbladder fossa.  She was also noted to have a reactive pleural effusion.  The patient was treated with antibiotics and discharged home on 5/6 on Augmentin. ?In the ED, the patient's WBC count was 19.7 CT of the abdomen and pelvis showed a fluid collection in the right subhepatic perinephric area which was essentially unchanged from previous imaging, small bilateral pleural effusions, punctate nonobstructing right upper pole stone. ?The patient was admitted and started on broad-spectrum IV antibiotics once again.  GI was consulted and it was recommended that the patient undergo IR drainage.  ID was consulted as well. ? ?Subjective:  ?Had a fever overnight.  States that she is having epigastric fullness/tenderness.  Left flank pain is still present and is exacerbated by her cough as before.  Cough is slightly better after starting Robitussin. ? ?Assessment and Plan: ?Principal Problem: ?  Intra-abdominal infection ?-Active to have a biloma that was subsequently infected ?- Has been evaluated by IR and has undergone CT-guided right retroperitoneal drain placement with 30 cc of purulent fluid that has been sent for Gram stain and culture ?-She continues to have a significant amount of cloudy drainage ?-Low-grade fever of 100.3 noted last night ?- Culture from abscess shows no WBC and some gram positive cocci-it is now growing Enterococcus and a yeast and she is being  transition to Augmentin and Diflucan. ? ?Active Problems: ?Cough ?- Dry cough is likely secondary to diaphragmatic irritation due to the above-mentioned infection and has not improved despite drainage- actually worsens with ambulation (possibly due to diaphragmatic effort) ?-Respiratory panel is negative ?- added Dextromethorphan which appears to be helping- she states the Tussionex sedates her and prefers it only at bedtime ?-Receiving Voltaren gel for left-sided flank pain which is likely muscular in relation to the extensive coughing spell she is having ? ?  Paroxysmal SVT (supraventricular tachycardia) (HCC) ?- no further occurences documented ?- dc telemetry ? ?  GERD without esophagitis ?-Continue PPI ? ?  Pleural effusion, bilateral ?- felt to be reactive to abdominal process ?-follow ?  ?  ?DVT prophylaxis:  enoxaparin (LOVENOX) injection 40 mg Start: 01/31/22 1000 ? ?  Code Status: Full Code  ?Consultants: GI, IR, ID ?Level of Care: Level of care: Telemetry ?Disposition Plan:  ?Status is: Inpatient ?Remains inpatient appropriate because: Ongoing treatment for infection ? ?Objective: ?  ?Vitals:  ? 01/31/22 1555 01/31/22 1556 01/31/22 2034 02/01/22 1150  ?BP:   107/70 101/63  ?Pulse:   95 91  ?Resp:   19   ?Temp: 99.5 ?F (37.5 ?C) 99.5 ?F (37.5 ?C) 100.1 ?F (37.8 ?C) 98.9 ?F (37.2 ?C)  ?TempSrc: Oral Oral Oral Oral  ?SpO2:   97% 98%  ?Weight:      ?Height:      ? ?Filed Weights  ? 01/27/22 1710  ?Weight: 68 kg  ? ?Exam: ?General exam: Appears comfortable  ?HEENT: PERRLA, oral mucosa moist, no sclera icterus or thrush ?Respiratory system: Clear to  auscultation. Respiratory effort normal. ?Cardiovascular system: S1 & S2 heard, regular rate and rhythm ?Gastrointestinal system: Abdomen soft, tender in epigastrium, nondistended. Normal bowel sounds   ?Back: Left flank tenderness noted, right flank drain present with about 25 cc of cloudy fluid in the bag ?Central nervous system: Alert and oriented. No focal  neurological deficits. ?Extremities: No cyanosis, clubbing or edema ?Skin: No rashes or ulcers ?Psychiatry:  Mood & affect appropriate.   ? ?Imaging and lab data was personally reviewed ? ? ? CBC: ?Recent Labs  ?Lab 01/27/22 ?1834 01/28/22 ?9935 01/29/22 ?0401 01/30/22 ?0401 02/01/22 ?0327  ?WBC 19.7* 19.9* 18.3* 13.6* 10.7*  ?NEUTROABS 15.7*  --   --   --   --   ?HGB 12.3 10.5* 11.0* 11.2* 10.8*  ?HCT 36.4 33.0* 35.0* 34.3* 33.8*  ?MCV 89.2 89.7 90.0 90.5 89.7  ?PLT 430* 463* 518* 535* 541*  ? ? ?Basic Metabolic Panel: ?Recent Labs  ?Lab 01/27/22 ?1834 01/28/22 ?7017 01/29/22 ?0401 01/30/22 ?0401 02/01/22 ?0327  ?NA 140 140 137 140 138  ?K 3.7 3.4* 3.6 3.8 3.9  ?CL 104 106 104 105 105  ?CO2 '27 26 23 27 26  '$ ?GLUCOSE 104* 102* 90 97 93  ?BUN '8 6 8 7 11  '$ ?CREATININE 0.53 0.46 0.55 0.60 0.53  ?CALCIUM 8.5* 8.3* 8.2* 8.3* 8.4*  ? ? ?GFR: ?Estimated Creatinine Clearance: 74.4 mL/min (by C-G formula based on SCr of 0.53 mg/dL). ? ?Scheduled Meds: ? amoxicillin-clavulanate  1 tablet Oral Q12H  ? chlorpheniramine-HYDROcodone  5 mL Oral QHS  ? cholestyramine light  4 g Oral BID  ? dextromethorphan  30 mg Oral BID  ? diclofenac Sodium  4 g Topical QID  ? dicyclomine  10 mg Oral TID AC & HS  ? enoxaparin (LOVENOX) injection  40 mg Subcutaneous Q24H  ? fluconazole  400 mg Oral Daily  ? sodium chloride flush  3 mL Intravenous Q12H  ? sodium chloride flush  5 mL Intracatheter Q8H  ? ?Continuous Infusions: ? ? ? ? LOS: 3 days  ? ?Author: ?Debbe Odea  ?02/01/2022 1:46 PM ?   ?

## 2022-02-01 NOTE — Plan of Care (Signed)
?  Problem: Education: ?Goal: Knowledge of General Education information will improve ?Description: Including pain rating scale, medication(s)/side effects and non-pharmacologic comfort measures ?Outcome: Progressing ?  ?Problem: Clinical Measurements: ?Goal: Ability to maintain clinical measurements within normal limits will improve ?Outcome: Progressing ?Goal: Will remain free from infection ?Outcome: Progressing ?Goal: Diagnostic test results will improve ?Outcome: Progressing ?  ?Problem: Nutrition: ?Goal: Adequate nutrition will be maintained ?Outcome: Progressing ?  ?Problem: Coping: ?Goal: Level of anxiety will decrease ?Outcome: Progressing ?  ?Problem: Elimination: ?Goal: Will not experience complications related to bowel motility ?Outcome: Progressing ?  ?Problem: Pain Managment: ?Goal: General experience of comfort will improve ?Outcome: Progressing ?  ?

## 2022-02-02 ENCOUNTER — Other Ambulatory Visit (HOSPITAL_COMMUNITY): Payer: Self-pay

## 2022-02-02 DIAGNOSIS — R058 Other specified cough: Secondary | ICD-10-CM

## 2022-02-02 DIAGNOSIS — K659 Peritonitis, unspecified: Secondary | ICD-10-CM | POA: Diagnosis not present

## 2022-02-02 DIAGNOSIS — B999 Unspecified infectious disease: Secondary | ICD-10-CM | POA: Diagnosis not present

## 2022-02-02 DIAGNOSIS — K651 Peritoneal abscess: Secondary | ICD-10-CM

## 2022-02-02 LAB — CULTURE, BLOOD (ROUTINE X 2)
Culture: NO GROWTH
Culture: NO GROWTH
Special Requests: ADEQUATE
Special Requests: ADEQUATE

## 2022-02-02 LAB — MISC LABCORP TEST (SEND OUT): Labcorp test code: 9985

## 2022-02-02 MED ORDER — SODIUM CHLORIDE 0.9 % IJ SOLN: 5.0000 mL | Freq: Once | INTRAMUSCULAR | 30 refills | Status: AC

## 2022-02-02 MED ORDER — TRAMADOL HCL 50 MG PO TABS: 50.0000 mg | ORAL_TABLET | Freq: Four times a day (QID) | ORAL | 0 refills | Status: DC | PRN

## 2022-02-02 MED ORDER — DICYCLOMINE HCL 10 MG PO CAPS: 10.0000 mg | ORAL_CAPSULE | Freq: Three times a day (TID) | ORAL | 0 refills | Status: DC | PRN

## 2022-02-02 MED ORDER — HYDROCOD POLI-CHLORPHE POLI ER 10-8 MG/5ML PO SUER: 5.0000 mL | Freq: Every day | ORAL | 0 refills | Status: AC

## 2022-02-02 MED ORDER — FLUCONAZOLE 200 MG PO TABS: 400.0000 mg | ORAL_TABLET | Freq: Every day | ORAL | 0 refills | Status: DC

## 2022-02-02 MED ORDER — DEXTROMETHORPHAN POLISTIREX ER 30 MG/5ML PO SUER: 30.0000 mg | Freq: Two times a day (BID) | ORAL | 0 refills | Status: DC

## 2022-02-02 MED ORDER — DICLOFENAC SODIUM 1 % EX GEL: 4.0000 g | Freq: Four times a day (QID) | CUTANEOUS | 0 refills | Status: DC

## 2022-02-02 MED ORDER — NORMAL SALINE FLUSH 0.9 % IV SOLN
INTRAVENOUS | 3 refills | Status: DC
  Filled 2022-02-02: qty 300, 30d supply, fill #0

## 2022-02-02 NOTE — Progress Notes (Signed)
?   ? ?[facility] ? ?Date of Admission:  01/27/2022    ?       ?Reason for visit: Follow up on abscess ? ? ? ?ASSESSMENT:   ? ?56 y.o. female admitted with: ? ?Subhepatic/perinephric abscess:  MRI abdomen 5/8 with contrast identified a stable in size, large complex fluid collection measuring about 5cm.  Underwent IR drain placement via CT guidance 5/10.  Cultures with E faecalis and yeast. ?Hx of gallstone pancreatitis s/p lap chole in July 2017:  This was complicated by postoperative fluid collection in the GB fossa (3.8x0.9x4.3cm) that was aspirated and had negative cultures.  She had a surgical JP drain at that time but no abscess drain placed. She received antibiotics during that admission but no further abx at discharge.  ? ? ?RECOMMENDATIONS:   ? ?Discharge home on Augmentin 875 BID and fluconazole 400 daily ?Will need outpatient drain follow up with IR ?Follow up scheduled with me on 5/26 ?Please call as needed ? ? ?Principal Problem: ?  Intra-abdominal infection ?Active Problems: ?  Paroxysmal SVT (supraventricular tachycardia) (HCC) ?  Other hyperlipidemia ?  GERD without esophagitis ?  Pleural effusion, bilateral ? ? ? ?MEDICATIONS:   ? ?Scheduled Meds: ? amoxicillin-clavulanate  1 tablet Oral Q12H  ? chlorpheniramine-HYDROcodone  5 mL Oral QHS  ? cholestyramine light  4 g Oral BID  ? dextromethorphan  30 mg Oral BID  ? diclofenac Sodium  4 g Topical QID  ? dicyclomine  10 mg Oral TID AC & HS  ? enoxaparin (LOVENOX) injection  40 mg Subcutaneous Q24H  ? fluconazole  400 mg Oral Daily  ? sodium chloride flush  3 mL Intravenous Q12H  ? sodium chloride flush  5 mL Intracatheter Q8H  ? ?Continuous Infusions: ?PRN Meds:.acetaminophen **OR** acetaminophen, albuterol, EPINEPHrine, famotidine, metoprolol tartrate, oxyCODONE, traMADol ? ?SUBJECTIVE:  ? ?24 hour events:  ?No acute events ?Tmax 100.7 ?Planning to go home today.  Discussed return precautions ?Feeling okay overall ? ? ?Review  of Systems  ?All other systems reviewed and are negative. ? ?  ?OBJECTIVE:  ? ?Blood pressure (!) 93/56, pulse 66, temperature 97.8 ?F (36.6 ?C), temperature source Oral, resp. rate 18, height '5\' 4"'$  (1.626 m), weight 68 kg, SpO2 98 %. ?Body mass index is 25.75 kg/m?. ? ?Physical Exam ?Constitutional:   ?   General: She is not in acute distress. ?   Appearance: Normal appearance.  ?HENT:  ?   Head: Normocephalic and atraumatic.  ?Eyes:  ?   Extraocular Movements: Extraocular movements intact.  ?   Conjunctiva/sclera: Conjunctivae normal.  ?Pulmonary:  ?   Effort: Pulmonary effort is normal. No respiratory distress.  ?Abdominal:  ?   General: There is no distension.  ?   Palpations: Abdomen is soft.  ?Musculoskeletal:     ?   General: Normal range of motion.  ?   Cervical back: Normal range of motion and neck supple.  ?Skin: ?   General: Skin is warm and dry.  ?Neurological:  ?   General: No focal deficit present.  ?   Mental Status: She is alert and oriented to person, place, and time.  ?Psychiatric:     ?   Mood and Affect: Mood normal.     ?   Behavior: Behavior normal.  ? ? ? ?Lab Results: ?Lab Results  ?Component Value Date  ? WBC 10.7 (H) 02/01/2022  ? HGB 10.8 (L) 02/01/2022  ? HCT 33.8 (L) 02/01/2022  ?  MCV 89.7 02/01/2022  ? PLT 541 (H) 02/01/2022  ?  ?Lab Results  ?Component Value Date  ? NA 138 02/01/2022  ? K 3.9 02/01/2022  ? CO2 26 02/01/2022  ? GLUCOSE 93 02/01/2022  ? BUN 11 02/01/2022  ? CREATININE 0.53 02/01/2022  ? CALCIUM 8.4 (L) 02/01/2022  ? GFRNONAA >60 02/01/2022  ? GFRAA >60 04/17/2016  ?  ?Lab Results  ?Component Value Date  ? ALT 15 01/30/2022  ? AST 11 (L) 01/30/2022  ? ALKPHOS 67 01/30/2022  ? BILITOT 0.5 01/30/2022  ? ? ?No results found for: CRP ? ?   ?Component Value Date/Time  ? ESRSEDRATE 2 11/28/2020 1000  ? ?  ?I have reviewed the micro and lab results in Epic. ? ?Imaging: ?No results found.  ? ?Imaging independently reviewed in Epic.  ? ? ?Mignon Pine ?[facility] ?[place] ?[phone] pager ?02/02/2022, 9:53 AM ? ? ?

## 2022-02-02 NOTE — Discharge Summary (Signed)
Physician Discharge Summary  ?Laurie Terry NKN:[id number] DOB: [birth year] DOA: 01/27/2022 ? ?PCP: [name], MD ? ?Admit date: 01/27/2022 ?Discharge date: 02/02/2022 ?Discharging to: home ?Recommendations for Outpatient Follow-up:  ?F/u on pleural effusions ? ?Consults:  ?GI ?IR ?ID ?  ? ? ?Discharge Diagnoses:  ? Principal Problem: ?  Iatrogenic subdiaphragmatic abscess ([facility]) ?Active Problems: ?  Paroxysmal SVT (supraventricular tachycardia) (HCC) ?  GERD without esophagitis ?  Other hyperlipidemia ?  Pleural effusion, bilateral ?  Dry cough ? ? ? ? ?Hospital Course: This is a 56 year old female with GERD, history of pancreatitis, thoracic aortic aneurysm, hyperlipidemia, paroxysmal SVT, who presents to the hospital for a fever of 101.5 and a right-sided lower chest upper abdominal pain along with diarrhea. ?The patient was hospitalized from April 25 through May 6 and underwent an ERCP which was complicated by microperforation and fluid collection in the gallbladder fossa.  She was also noted to have a reactive pleural effusion.  The patient was treated with antibiotics and discharged home on 5/6 on Augmentin. ?In the ED, the patient's WBC count was 19.7 CT of the abdomen and pelvis showed a fluid collection in the right subhepatic perinephric area which was essentially unchanged from previous imaging, small bilateral pleural effusions, punctate nonobstructing right upper pole stone. ?The patient was admitted and started on broad-spectrum IV antibiotics once again.  GI was consulted and it was recommended that the patient undergo IR drainage.  ID was consulted as well. ? ?Intra-abdominal infection ?-Active to have a biloma that was subsequently infected ?- Has been evaluated by IR and has undergone CT-guided right retroperitoneal drain placement with 30 cc of purulent fluid that has been sent for Gram stain and culture ?-She continues to have a significant amount of cloudy drainage ?-Low-grade fevers of 100.3  occurring usually in the evening ?- Culture from abscess shows no WBC and some gram positive cocci-it is now growing Enterococcus and a yeast and she is being transition to Augmentin and Diflucan. ?  ?Active Problems: ?Cough ?- Dry cough is likely secondary to diaphragmatic irritation due to the above-mentioned infection and has not improved despite drainage- actually worsens with ambulation (possibly due to diaphragmatic effort) ?-Respiratory panel is negative ?- added Dextromethorphan which appears to be helping- she states the Tussionex sedates her and prefers it only at bedtime ?-Receiving Voltaren gel for left-sided flank pain which is likely muscular in relation to the extensive coughing spell she is having ?- cough and pain improved after starting Augmentin ?  ?  Paroxysmal SVT (supraventricular tachycardia) (HCC) ?- no further occurences documented ?  ?  ?  GERD without esophagitis ?-Continue PPI ?  ?  Pleural effusion, bilateral ?- felt to be reactive to abdominal process ?-follow ?  ? Discharge Instructions ? ?Discharge Instructions   ? ? Diet - low sodium heart healthy   Complete by: As directed ?  ? Increase activity slowly   Complete by: As directed ?  ? No wound care   Complete by: As directed ?  ? ?  ? ?Allergies as of 02/02/2022   ? ?   Reactions  ? Cefepime Cough  ? Patient develops cough, rash in her ears.   ? Contrast Media [iodinated Contrast Media] Shortness Of Breath  ? Low blood pressure   ? Iodine Shortness Of Breath, Swelling  ? Other Hives, Swelling  ? Seafood  ? Ampicillin Hives  ? Childhood allergy  ? Avocado Itching  ? Tongue and mouth  ? Levofloxacin  Hives  ? Pt already has aortic aneurysm.  ? Pantoprazole Other (See Comments)  ? Joint aches, fever, peeling of skin  ? Septra [bactrim] Hives  ? Sulfamethoxazole-trimethoprim Other (See Comments), Hives  ? Vancomycin Cough  ? Patient develops cough, rash ears, tingling lips after receiving Cefepime and vancomycin 01/23/2022  ? Colestipol Hcl  Rash  ? ?  ? ?  ?Medication List  ?  ? ?TAKE these medications   ? ?acetaminophen 500 MG tablet ?Commonly known as: TYLENOL ?Take 1,000 mg by mouth every 6 (six) hours as needed for mild pain or fever. ?  ?albuterol 108 (90 Base) MCG/ACT inhaler ?Commonly known as: ProAir HFA ?Inhale 2 puffs into the lungs every 4 (four) hours as needed for wheezing or shortness of breath (coughing fits). ?  ?amoxicillin-clavulanate 875-125 MG tablet ?Commonly known as: AUGMENTIN ?Take 1 tablet by mouth every 12 (twelve) hours for 14 days. ?  ?chlorpheniramine-HYDROcodone 10-8 MG/5ML ?Take 5 mLs by mouth at bedtime for 14 doses. ?  ?cholestyramine light 4 g packet ?Commonly known as: PREVALITE ?Take 1 packet (4 g total) by mouth 2 (two) times daily. ?  ?dextromethorphan 30 MG/5ML liquid ?Commonly known as: DELSYM ?Take 5 mLs (30 mg total) by mouth 2 (two) times daily. ?  ?diclofenac Sodium 1 % Gel ?Commonly known as: VOLTAREN ?Apply 4 g topically 4 (four) times daily. ?  ?dicyclomine 10 MG capsule ?Commonly known as: BENTYL ?Take 1 capsule (10 mg total) by mouth 3 (three) times daily as needed for spasms. ?  ?eletriptan 40 MG tablet ?Commonly known as: RELPAX ?40 mg every 2 (two) hours as needed for migraine. ?  ?EPINEPHrine 0.3 mg/0.3 mL Soaj injection ?Commonly known as: EPI-PEN ?Inject 0.3 mg into the muscle as needed for anaphylaxis. ?  ?famotidine 20 MG tablet ?Commonly known as: PEPCID ?Take 20 mg by mouth daily as needed for heartburn or indigestion. ?  ?fluconazole 200 MG tablet ?Commonly known as: DIFLUCAN ?Take 2 tablets (400 mg total) by mouth daily for 11 days. ?Start taking on: Feb 03, 2022 ?  ?fluticasone 50 MCG/ACT nasal spray ?Commonly known as: FLONASE ?Place 2 sprays into both nostrils 2 (two) times daily as needed for allergies. ?  ?ketotifen 0.025 % ophthalmic solution ?Commonly known as: ZADITOR ?Place 1 drop into both eyes daily as needed (Itching eye and allergies). ?  ?metoprolol tartrate 25 MG  tablet ?Commonly known as: LOPRESSOR ?Take 1 tablet (25 mg total) by mouth daily as needed (svt). ?  ?potassium chloride 10 MEQ tablet ?Commonly known as: KLOR-CON M ?Take 1 tablet (10 mEq total) by mouth daily for 3 days. ?  ?sodium chloride 0.9 % injection ?Inject 5 mLs into the vein once for 1 dose. ?  ?traMADol 50 MG tablet ?Commonly known as: ULTRAM ?Take 1 tablet (50 mg total) by mouth every 6 (six) hours as needed for moderate pain. ?  ? ?  ? ? Follow-up Information   ? ? Mignon Pine, DO Follow up on 02/15/2022.   ?Specialties: Infectious Diseases, Internal Medicine ?Why: Appointment at 3:30pm ?Contact information: ?[address] ?[address] ?[place] [address] ?[phone] ? ? ?  ?  ? ? Arne Cleveland, MD. Go to.   ?Specialties: Interventional Radiology, Radiology ?Why: Clinic will call you to schedule follow up appointment. ?Contact information: ?[address] ?[address] ?[place] [address] ?[phone] ? ? ?  ?  ? ?  ?  ? ?  ? ?  ?  ?The  results of significant diagnostics from this hospitalization (including imaging, microbiology, ancillary and laboratory) are listed below for reference.   ? ?CT Abdomen Pelvis Wo Contrast ? ?Result Date: 01/27/2022 ?CLINICAL DATA:  Abdominal pain. Right-sided retroperitoneal inflammatory process after ERCP with persistent fever. Status post recent right thoracentesis. EXAM: CT ABDOMEN AND PELVIS WITHOUT CONTRAST TECHNIQUE: Multidetector CT imaging of the abdomen and pelvis was performed following the standard protocol without IV contrast. RADIATION DOSE REDUCTION: This exam was performed according to the departmental dose-optimization program which includes automated exposure control, adjustment of the mA and/or kV according to patient size and/or use of iterative reconstruction technique. COMPARISON:  CT abdomen pelvis dated 01/24/2022. FINDINGS: Evaluation of this exam is limited in the absence of intravenous contrast. Lower chest: Small bilateral pleural  effusions similar to prior CT. There is associated partial compressive atelectasis of the lower lobes. No intra-abdominal free air. Hepatobiliary: The liver is unremarkable. Cholecystectomy. Similar pneumobilia. S

## 2022-02-02 NOTE — Progress Notes (Signed)
Patient discharged home.  Discharge instructions explained, patient verbalizes understanding 

## 2022-02-02 NOTE — TOC Progression Note (Signed)
Transition of Care (TOC) - Progression Note  ? ? ?Patient Details  ?Name: Laurie Terry ?MRN: [id number] ?Date of Birth: 09-12-1966 ? ?Transition of Care (TOC) CM/SW Contact  ?Purcell Mouton, RN ?Phone Number: ?02/02/2022, 1:04 PM ? ?Clinical Narrative:    ? ?Transition of Care (TOC) Screening Note ? ? ?Patient Details  ?Name: Laurie Terry ?Date of Birth: [birth year] ? ? ?Transition of Care (TOC) CM/SW Contact:    ?Purcell Mouton, RN ?Phone Number: ?02/02/2022, 1:05 PM ? ? ? ?Transition of Care Department [facility]) has reviewed patient and no TOC needs have been identified at this time. We will continue to monitor patient advancement through interdisciplinary progression rounds. If new patient transition needs arise, please place a TOC consult. ?  ? ? ?Expected Discharge Plan: Home/Self Care ?Barriers to Discharge: No Barriers Identified ? ?Expected Discharge Plan and Services ?Expected Discharge Plan: Home/Self Care ?  ?Discharge Planning Services: CM Consult ?  ?Living arrangements for the past 2 months: [place] ?Expected Discharge Date: 02/02/22               ?  ?  ?  ?  ?  ?  ?  ?  ?  ?  ? ? ?Social Determinants of Health (SDOH) Interventions ?  ? ?Readmission Risk Interventions ?   ? View : No data to display.  ?  ?  ?  ? ? ?

## 2022-02-04 ENCOUNTER — Other Ambulatory Visit (HOSPITAL_COMMUNITY): Payer: Self-pay | Admitting: Internal Medicine

## 2022-02-04 ENCOUNTER — Other Ambulatory Visit: Payer: Self-pay | Admitting: Internal Medicine

## 2022-02-04 DIAGNOSIS — B999 Unspecified infectious disease: Secondary | ICD-10-CM

## 2022-02-04 DIAGNOSIS — R1011 Right upper quadrant pain: Secondary | ICD-10-CM

## 2022-02-04 LAB — AEROBIC/ANAEROBIC CULTURE W GRAM STAIN (SURGICAL/DEEP WOUND)
Gram Stain: NONE SEEN
Special Requests: NORMAL

## 2022-02-05 ENCOUNTER — Telehealth: Payer: Self-pay

## 2022-02-05 NOTE — Telephone Encounter (Signed)
Patient called and states she has been experiencing low grade temps since being discharged. She has been taking the Augmentin since 5/13, but is concerned that it is not working due to her low grade fevers.  ? ?Reports she woke up with a temp of 99.2 this morning which is high for her as her temperature usually runs below 98.6.  ? ?She denies chills, but states that she does experience body aches when her temp is elevated. Endorses abdominal pain and diarrhea, but says that this is nothing new.  ? ?Her highest recorded temp was 101.1 on 5/13 and she complains of night sweats and just generally feeling "blah."  ? ?She occasionally takes Tylenol for the fevers, but not consistently as she wants to be able to record an accurate temperature. She is concerned that she had a temp this morning, as her fevers do not typically develop until the afternoon/evening. Will route to provider.  ? ?Beryle Flock, RN ? ? ? ?

## 2022-02-06 LAB — TOTAL BILIRUBIN, BODY FLUID: Total bilirubin, fluid: <0.2 mg/dL

## 2022-02-07 ENCOUNTER — Encounter: Payer: Self-pay | Admitting: Internal Medicine

## 2022-02-07 ENCOUNTER — Encounter: Payer: Self-pay | Admitting: Infectious Diseases

## 2022-02-07 ENCOUNTER — Other Ambulatory Visit: Payer: Self-pay

## 2022-02-07 ENCOUNTER — Ambulatory Visit (INDEPENDENT_AMBULATORY_CARE_PROVIDER_SITE_OTHER): Payer: 59 | Admitting: Infectious Diseases

## 2022-02-07 ENCOUNTER — Encounter: Payer: Self-pay | Admitting: Primary Care

## 2022-02-07 ENCOUNTER — Ambulatory Visit (INDEPENDENT_AMBULATORY_CARE_PROVIDER_SITE_OTHER): Payer: 59

## 2022-02-07 ENCOUNTER — Ambulatory Visit: Payer: 59 | Admitting: Internal Medicine

## 2022-02-07 ENCOUNTER — Ambulatory Visit: Payer: 59 | Admitting: Primary Care

## 2022-02-07 VITALS — BP 90/60 | HR 78 | Ht 65.0 in | Wt 136.0 lb

## 2022-02-07 VITALS — BP 118/64 | HR 78 | Temp 99.0°F | Ht 65.0 in | Wt 136.8 lb

## 2022-02-07 DIAGNOSIS — K651 Peritoneal abscess: Secondary | ICD-10-CM | POA: Diagnosis not present

## 2022-02-07 DIAGNOSIS — J90 Pleural effusion, not elsewhere classified: Secondary | ICD-10-CM

## 2022-02-07 DIAGNOSIS — I471 Supraventricular tachycardia: Secondary | ICD-10-CM

## 2022-02-07 DIAGNOSIS — I491 Atrial premature depolarization: Secondary | ICD-10-CM | POA: Diagnosis not present

## 2022-02-07 DIAGNOSIS — N151 Renal and perinephric abscess: Secondary | ICD-10-CM | POA: Diagnosis not present

## 2022-02-07 DIAGNOSIS — R058 Other specified cough: Secondary | ICD-10-CM

## 2022-02-07 DIAGNOSIS — I712 Thoracic aortic aneurysm, without rupture, unspecified: Secondary | ICD-10-CM

## 2022-02-07 NOTE — Progress Notes (Signed)
   Subjective:    Patient ID: Laurie Terry, female    DOB: Mar 09, 1966, 56 y.o.   MRN: [id number]  HPI 56 yo F adm to hospital 4-25 to 5-6 for ERCP complicated by microperforation and resultant fluid collection. She was d/c with augmentin.  She returned 5-7 to 5-13 with fever to 101, WBC 19.7, and found on CT of the abdomen and pelvis showed a fluid collection in the right subhepatic perinephric area which was essentially unchanged from previous imaging. She had CT guided drain which returned 30 of purulent material and grew Enterococcus faecalis (pan-sens) and C albicans. She was d/c on augmentin and diflucan.   She returns today with continued temps (up to 101). Still having pain which she feels like is deeper in her abd. Her mom has been helping her clean the site. Was noted to be erythematous. Still noting purulent d/c in her bag.  Sees IR tomorrow.  Believes her temps are usually below 98.6.   Review of Systems  Constitutional:  Positive for chills and fever.  Gastrointestinal:  Positive for abdominal distention, abdominal pain and diarrhea. Negative for constipation.  Genitourinary:  Negative for difficulty urinating.      Objective:   Physical Exam Vitals reviewed.  Constitutional:      General: She is not in acute distress.    Appearance: Normal appearance. She is not toxic-appearing.  HENT:     Mouth/Throat:     Mouth: Mucous membranes are moist.     Pharynx: No oropharyngeal exudate.  Eyes:     Extraocular Movements: Extraocular movements intact.     Pupils: Pupils are equal, round, and reactive to light.  Cardiovascular:     Rate and Rhythm: Normal rate and regular rhythm.  Pulmonary:     Effort: Pulmonary effort is normal.     Breath sounds: Normal breath sounds.  Abdominal:     General: Bowel sounds are normal.     Palpations: Abdomen is soft.  Musculoskeletal:        General: No swelling.       Arms:     Cervical back: Normal range of motion and neck  supple.     Right lower leg: No edema.     Left lower leg: No edema.     Comments: Drain site is clean, minimal erythema, no d/c. No tenderness.   Neurological:     Mental Status: She is alert.         Assessment & Plan:

## 2022-02-07 NOTE — Progress Notes (Signed)
$'@Patient'R$  ID: Terrace Arabia, female    DOB: Aug 09, 1966, 56 y.o.   MRN: [id number]  Chief Complaint  Patient presents with   Hospitalization Follow-up    Pt is here for follow up from hospital due to pleural effusion. Pt does have drain in place. Pt is still currently on Augmentin and Diflucan. Was discharged from hospital on Saturday. Pt states that she still have a cough, no mucus production.     Referring provider: [name], MD  HPI: 56 year old female, former smoker. PMH significant for bilateral pleural effusion, lap cholecystectomy, pancreatitis due to biliary obstruction, paroxysmal SVT, thoracic aortic aneurysm without rupture.   02/07/2022 Patient presents today for hospital follow-up.  She was originally hospitalized from April 25 through May 6 for fever and abdominal pain.  At that time she underwent ERCP which was complicated by micro perforation and fluid collection in the gallbladder fossa.  She was also noted to have reactive pleural effusion.  She had right thoracentesis with 326m removed, consistent with exudative effusion. Patient was treated with antibiotics and discharged home on 5/6 on Augmentin.  She presented back to the emergency room on 5/7 and white count was noted to be 19.7.  CT of abdomen and pelvis showed fluid collection in right subhepatic perinephritic area which was essentially unchanged from previous, small bilateral pleural effusions,punctate nonobstructing right upper pole stone.  Patient was admitted and started on broad-spectrum IV antibiotics.  GI was consulted and underwent CT-guided right retroperitoneal drain placement.  Culture from abscess grew out Enterococcus and yeast, patient was transitioned to Augmentin and Diflucan.  She was noted to have a dry cough felt to be secondary to diaphragmatic irritation.  Pleural effusion felt to be reactive due to abdominal process.  She still has dry cough which causes some pain on her right side. She has  not been taking cough suppressant. Perc drain draining 10-134mcloudy white fluid. Temp <99. She is scheduled for CT abdomen/pelvis tomorrow along with apt with IR.    Allergies  Allergen Reactions   Cefepime Cough    Patient develops cough, rash in her ears.    Contrast Media [Iodinated Contrast Media] Shortness Of Breath    Low blood pressure    Iodine Shortness Of Breath and Swelling   Other Hives and Swelling    Seafood   Ampicillin Hives    Childhood allergy   Avocado Itching    Tongue and mouth   Levofloxacin Hives    Pt already has aortic aneurysm.   Pantoprazole Other (See Comments)    Joint aches, fever, peeling of skin   Septra [Bactrim] Hives   Sulfamethoxazole-Trimethoprim Other (See Comments) and Hives   Vancomycin Cough    Patient develops cough, rash ears, tingling lips after receiving Cefepime and vancomycin 01/23/2022   Colestipol Hcl Rash    Immunization History  Administered Date(s) Administered   Hepatitis A, Adult 11/24/2015, 05/31/2016   Influenza,inj,Quad PF,6+ Mos 06/21/2013, 07/01/2016, 06/08/2017, 06/22/2020   Influenza-Unspecified 06/24/2015, 07/15/2019   PFIZER(Purple Top)SARS-COV-2 Vaccination 12/04/2019, 12/25/2019, 08/11/2020, 11/20/2020, 05/30/2021   Td 11/24/2015   Tdap 01/03/2006, 11/24/2015   Typhoid Inactivated 11/24/2015   Zoster, Live 05/18/2020, 10/20/2020    Past Medical History:  Diagnosis Date   Allergic rhinitis    Aortic aneurysm ([facility]/09/2020   Cancer ([facility]   Melanoma   Cervical dysplasia 2007   low-grade SIL, normal Pap smears since then   Family history of adverse reaction to anesthesia  mom has h/o post op nausea/vomiting   Intermittent palpitations    IUD    inserted 03/2002.04/06/2007, 06/03/2012   Ocular migraine    Osteomalacia    Pancreatitis    Seafood allergy     Tobacco History: Social History   Tobacco Use  Smoking Status Former  Smokeless Tobacco Never   Counseling given: Not  Answered   Outpatient Medications Prior to Visit  Medication Sig Dispense Refill   acetaminophen (TYLENOL) 500 MG tablet Take 1,000 mg by mouth every 6 (six) hours as needed for mild pain or fever.     albuterol (PROAIR HFA) 108 (90 Base) MCG/ACT inhaler Inhale 2 puffs into the lungs every 4 (four) hours as needed for wheezing or shortness of breath (coughing fits). 8 g 1   chlorpheniramine-HYDROcodone 10-8 MG/5ML Take 5 mLs by mouth at bedtime for 14 doses. 70 mL 0   cholestyramine light (PREVALITE) 4 g packet Take 1 packet (4 g total) by mouth 2 (two) times daily. (Patient taking differently: Take 4 g by mouth as needed.) 30 packet 0   dextromethorphan (DELSYM) 30 MG/5ML liquid Take 5 mLs (30 mg total) by mouth 2 (two) times daily. (Patient taking differently: Take 30 mg by mouth as needed for cough.) 89 mL 0   diclofenac Sodium (VOLTAREN) 1 % GEL Apply 4 g topically 4 (four) times daily. 100 g 0   dicyclomine (BENTYL) 10 MG capsule Take 1 capsule (10 mg total) by mouth 3 (three) times daily as needed for spasms. 90 capsule 0   eletriptan (RELPAX) 40 MG tablet 40 mg every 2 (two) hours as needed for migraine.     EPINEPHrine 0.3 mg/0.3 mL IJ SOAJ injection Inject 0.3 mg into the muscle as needed for anaphylaxis.     famotidine (PEPCID) 20 MG tablet Take 20 mg by mouth daily as needed for heartburn or indigestion.     fluticasone (FLONASE) 50 MCG/ACT nasal spray Place 2 sprays into both nostrils 2 (two) times daily as needed for allergies.     ketotifen (ZADITOR) 0.025 % ophthalmic solution Place 1 drop into both eyes daily as needed (Itching eye and allergies).     metoprolol tartrate (LOPRESSOR) 25 MG tablet Take 1 tablet (25 mg total) by mouth daily as needed (svt). 10 tablet 0   Sodium Chloride Flush (NORMAL SALINE FLUSH) 0.9 % SOLN Flush right drain with 5 ml of sterile saline once daily 300 mL 3   traMADol (ULTRAM) 50 MG tablet Take 1 tablet (50 mg total) by mouth every 6 (six) hours as  needed for moderate pain. 30 tablet 0   amoxicillin-clavulanate (AUGMENTIN) 875-125 MG tablet Take 1 tablet by mouth every 12 (twelve) hours for 14 days. 28 tablet 0   fluconazole (DIFLUCAN) 200 MG tablet Take 2 tablets (400 mg total) by mouth daily for 11 days. 22 tablet 0   No facility-administered medications prior to visit.   Review of Systems  Review of Systems  Constitutional: Negative.   HENT: Negative.    Respiratory:  Positive for cough.   Cardiovascular: Negative.      Physical Exam  BP 118/64 (BP Location: Right Arm, Patient Position: Sitting, Cuff Size: Normal)   Pulse 78   Temp 99 F (37.2 C) (Oral)   Ht '5\' 5"'$  (1.651 m)   Wt 136 lb 12.8 oz (62.1 kg)   SpO2 99%   BMI 22.76 kg/m  Physical Exam Constitutional:      Appearance: Normal appearance.  HENT:  Head: Normocephalic and atraumatic.     Right Ear: Tympanic membrane and external ear normal.     Left Ear: Tympanic membrane and external ear normal.     Mouth/Throat:     Mouth: Mucous membranes are moist.     Pharynx: Oropharynx is clear.  Cardiovascular:     Rate and Rhythm: Normal rate and regular rhythm.  Pulmonary:     Effort: Pulmonary effort is normal.     Comments: Diminished right lung base, otherwise clear  Musculoskeletal:        General: Normal range of motion.  Skin:    General: Skin is warm and dry.  Neurological:     General: No focal deficit present.     Mental Status: She is alert and oriented to person, place, and time. Mental status is at baseline.  Psychiatric:        Mood and Affect: Mood normal.        Behavior: Behavior normal.        Thought Content: Thought content normal.        Judgment: Judgment normal.      Lab Results:  CBC    Component Value Date/Time   WBC 9.4 02/15/2022 1541   RBC 4.30 02/15/2022 1541   HGB 12.4 02/15/2022 1541   HCT 37.1 02/15/2022 1541   PLT 449 (H) 02/15/2022 1541   MCV 86.3 02/15/2022 1541   MCH 28.8 02/15/2022 1541   MCHC 33.4  02/15/2022 1541   RDW 13.7 02/15/2022 1541   LYMPHSABS 1,626 02/15/2022 1541   MONOABS 1.0 01/27/2022 1834   EOSABS 508 (H) 02/15/2022 1541   BASOSABS 85 02/15/2022 1541    BMET    Component Value Date/Time   NA 142 02/15/2022 1541   NA 143 11/20/2020 1319   K 4.2 02/15/2022 1541   CL 101 02/15/2022 1541   CO2 29 02/15/2022 1541   GLUCOSE 94 02/15/2022 1541   BUN 13 02/15/2022 1541   BUN 14 11/20/2020 1319   CREATININE 0.64 02/15/2022 1541   CALCIUM 9.5 02/15/2022 1541   GFRNONAA >60 02/01/2022 0327   GFRAA >60 04/17/2016 0517    BNP    Component Value Date/Time   BNP 160.1 (H) 01/24/2022 0405    ProBNP    Component Value Date/Time   PROBNP 33.0 10/27/2007 1018    Imaging: CT ABDOMEN PELVIS WO CONTRAST  Result Date: 03/01/2022 CLINICAL DATA:  56 year old female with a history of subhepatic abscess after ERCP. EXAM: CT ABDOMEN AND PELVIS WITHOUT CONTRAST TECHNIQUE: Multidetector CT imaging of the abdomen and pelvis was performed following the standard protocol without IV contrast. RADIATION DOSE REDUCTION: This exam was performed according to the departmental dose-optimization program which includes automated exposure control, adjustment of the mA and/or kV according to patient size and/or use of iterative reconstruction technique. COMPARISON:  02/20/2022 FINDINGS: Lower chest: No acute finding of the lower chest. Interval resolution of right-sided pleural fluid. Hepatobiliary: Pneumobilia, similar to prior. Surgical changes of cholecystectomy. No radiopaque stones in the region of the common bile duct. Otherwise unremarkable liver. Pigtail drainage catheter in the subhepatic space unchanged. Greatest axial dimensions of the fluid adjacent to the drain estimated 3 cm x 11 mm, previously 5.1 cm x 17 mm. No new collection or new inflammation. Pancreas: Unremarkable Spleen: Unremarkable Adrenals/Urinary Tract: Unremarkable adrenal glands. No hydronephrosis. Trace residual  inflammation in the fat adjacent to the right kidney. No enlarging fluid or new fluid. Unremarkable left with no inflammatory changes. Stomach/Bowel: Unremarkable  stomach. Unremarkable small bowel. No transition point. Appendix is not visualized, however, no inflammatory changes are present adjacent to the cecum to indicate an appendicitis. Moderate stool burden. No inflammatory changes. No evidence of obstruction. Vascular/Lymphatic: No atherosclerotic changes. No aneurysm. No adenopathy. Reproductive: Unremarkable uterus/adnexa Other: None Musculoskeletal: No acute displaced fracture. Left-sided sacral Tarlov cyst again noted. Mild degenerative changes of the spine. IMPRESSION: Unchanged position of pigtail drain in the subhepatic space, with near complete resolution of the nonspecific fluid/tissue. Interval resolution of right-sided pleural fluid. Electronically Signed   By: Corrie Mckusick D.O.   On: 03/01/2022 14:31   IR Radiologist Eval & Mgmt  Result Date: 02/28/2022 Please refer to notes tab for details about interventional procedure. (Op Note)  CT ABDOMEN PELVIS WO CONTRAST  Result Date: 02/20/2022 CLINICAL DATA:  One week follow-up perinephric abscess. Last drain interrogation 02/11/2022. EXAM: CT ABDOMEN AND PELVIS WITHOUT CONTRAST TECHNIQUE: Multidetector CT imaging of the abdomen and pelvis was performed following the standard protocol without IV contrast. RADIATION DOSE REDUCTION: This exam was performed according to the departmental dose-optimization program which includes automated exposure control, adjustment of the mA and/or kV according to patient size and/or use of iterative reconstruction technique. COMPARISON:  CT AP, 02/08/2022.  IR fluoroscopy, 02/11/2022. FINDINGS: Lower chest: Persistent small volume RIGHT pleural effusion with minimal adjacent dependent atelectasis. Hepatobiliary: Trace pneumobilia as noted within the LEFT hepatic lobe. Persistent and likely secondary to prior  sphincterotomy. Status post cholecystectomy. No biliary dilatation. Small volume residual hypodense perihepatic fluid collection, along the inferomedial margin of the RIGHT hepatic lobe, and measuring up to 5.0 x 1.6 x 1.4 cm. This is improved since 02/08/2022 comparison Interval repositioning of infrahepatic percutaneous drainage catheter, with pigtail just inferior to aforementioned residual collection. Pancreas: No pancreatic ductal dilatation or surrounding inflammatory changes. Spleen: Normal in size without focal abnormality. Adrenals/Urinary Tract: Adrenal glands are unremarkable. Trace residual RIGHT perihepatic stranding. No CT evidence of residual RIGHT perinephric fluid collection. The LEFT kidney is normal. No renal calculi, focal lesion, or hydronephrosis. Bladder is unremarkable. Stomach/Bowel: Stomach is within normal limits. Appendix appears normal. No evidence of bowel wall thickening, distention, or inflammatory changes. Vascular/Lymphatic: No significant vascular findings are present. No enlarged abdominal or pelvic lymph nodes. Reproductive: Uterus and adnexa are unremarkable. Pelvic phleboliths. Other: No abdominal wall hernia or abnormality. No abdominopelvic ascites. Musculoskeletal: Incidental LEFT sacral Tarlov cyst. No acute osseous findings. No follow-up is indicated for incidental findings, unless specifically mentioned. IMPRESSION: Since CT AP dated 02/08/2022; 1. Repositioning with improved location of RIGHT infrahepatic / perinephric drainage catheter. 2. Trace volume of residual perihepatic fluid collection along the inferomedial margin of the RIGHT hepatic lobe. 3. No CT evidence of residual RIGHT perinephric fluid collection. 4. Trace residual RIGHT pleural effusion, likely reactive, and pneumobilia. Additional incidental, chronic and senescent findings as above. Electronically Signed   By: Michaelle Birks M.D.   On: 02/20/2022 17:16   IR Radiologist Eval & Mgmt  Result Date:  02/20/2022 Please refer to notes tab for details about interventional procedure. (Op Note)  IR EXCHANGE BILIARY DRAIN  Result Date: 02/11/2022 INDICATION: 56 year old woman with prior history of perinephric abscess status post drain placement on 01/30/2022 presented to interventional radiology clinic with low-grade intermittent fever and increasing right flank pain. CT of the abdomen and pelvis on 02/08/2022 showed resolution of the Peri nephric component, however complex collection still remained between the liver and kidney. Patient presents to interventional Radiology for attempted repositioning of the Peri nephric drain/aspiration  of remaining component between the liver and right kidney. EXAM: 1. Repositioning of right retroperitoneal drain. 2. Ultrasound-guided aspiration of right retroperitoneal residual fluid collection. MEDICATIONS: The patient is currently admitted to the hospital and receiving intravenous antibiotics. The antibiotics were administered within an appropriate time frame prior to the initiation of the procedure. ANESTHESIA/SEDATION: Fentanyl 25 mcg IV; Versed 0.5 mg IV Moderate Sedation Time:  26 minutes The patient was continuously monitored during the procedure by the interventional radiology nurse under my direct supervision. COMPLICATIONS: None immediate. PROCEDURE: Informed written consent was obtained from the patient after a thorough discussion of the procedural risks, benefits and alternatives. All questions were addressed. Maximal Sterile Barrier Technique was utilized including caps, mask, sterile gowns, sterile gloves, sterile drape, hand hygiene and skin antiseptic. A timeout was performed prior to the initiation of the procedure. Patient position prone on the fluoroscopy table. The external segment of the existing pigtail catheter and surrounding skin prepped and draped in usual fashion. Contrast administered under fluoroscopy showed communication between the perinephric  collection and the collection seen more superiorly adjacent to the liver. The existing drain was removed over 0.035 inch glidewire. Kumpe catheter and Glidewire were successfully navigated superiorly to the level of the residual fluid collection. 90 [place] biliary type drain was inserted to allow for drainage of the superior and inferior perinephric components. Contrast administered under fluoroscopy opacified both collections. The drain was secured to skin with suture and connected to bag. Sonographic evaluation of the right upper quadrant demonstrated a complex collection interposed between the right kidney and liver. Following local lidocaine administration, this area was accessed with 19 gauge Yueh needle under continuous ultrasound guidance, however no significant fluid could be aspirated. IMPRESSION: 1. Successful repositioning of right retroperitoneal drain with new 12 [place] biliary type catheter in place with pigtail positioned in the superior perinephric region and additional sideholes positioned in the inferior perinephric region. 2. Residual collection between the liver and right kidney was percutaneously accessed utilizing ultrasound guidance, however no significant fluid could be aspirated. PLAN: 1. Return in 1 week to IR clinic for repeat CT and drain check. 2. Patient instructed to contact us earlier if she develops worsening pain or fever. Electronically Signed   By: Miachel Roux M.D.   On: 02/11/2022 17:50   CT ABDOMEN PELVIS WO CONTRAST  Result Date: 02/08/2022 CLINICAL DATA:  56 year old female with a complicated right perinephric abscess secondary to presumed microperforation sustained during prior ERCP for choledocholithiasis. EXAM: CT ABDOMEN AND PELVIS WITHOUT CONTRAST TECHNIQUE: Multidetector CT imaging of the abdomen and pelvis was performed following the standard protocol without IV contrast. RADIATION DOSE REDUCTION: This exam was performed according to the departmental  dose-optimization program which includes automated exposure control, adjustment of the mA and/or kV according to patient size and/or use of iterative reconstruction technique. COMPARISON:  MRI abdomen 01/28/2022; CT abdomen/pelvis 01/27/2022 FINDINGS: Lower chest: Small right pleural effusion, similar compared to prior. Associated right lower lobe atelectasis. Unremarkable appearance of the heart and distal esophagus. Hepatobiliary: Surgical changes of prior cholecystectomy. Pneumobilia is present consistent with patency of the biliary sphincter. Normal hepatic contour and morphology. No discrete hepatic lesion. No biliary ductal dilatation. Pancreas: Unremarkable. No pancreatic ductal dilatation or surrounding inflammatory changes. Spleen: Normal in size without focal abnormality. Adrenals/Urinary Tract: Unremarkable adrenal glands. The left kidney is normal in appearance. Irregular fluid collection remains present in the right superior perinephric space extending inferiorly along the lateral margin of the perinephric space to a percutaneous drainage catheter.  The drainage catheter is well position. The inferior aspect of the fluid collection is significantly reduced in volume. However, the more superior aspect remains nearly unchanged. No new fluid collection identified. Ureters and bladder are unremarkable. Stomach/Bowel: Stomach is within normal limits. Appendix appears normal. No evidence of bowel wall thickening, distention, or inflammatory changes. Vascular/Lymphatic: Limited evaluation in the absence of intravenous contrast. No evidence of atherosclerotic plaque or suspicious adenopathy. Reproductive: Status post hysterectomy. No adnexal masses. Other: No abdominal wall hernia or abnormality. No abdominopelvic ascites. Musculoskeletal: No acute or significant osseous findings. Expansile low-attenuation lesion present within the left sacral foramen has mildly enlarged compared to prior imaging from 2017.  IMPRESSION: 1. Well-positioned right inferior perinephric drainage catheter with improvement in the amount of irregular perinephric fluid adjacent to the catheter. However, complex perinephric fluid persists more superiorly extending up into the apex of the right perinephric space just below the diaphragm. 2. Persistent and unchanged small right pleural effusion and associated right lower lobe atelectasis. 3. Progressive expansile lesion in the S2 neural foramen may represent a Tarlov cyst. 4. Additional ancillary findings as above. Electronically Signed   By: Jacqulynn Cadet M.D.   On: 02/08/2022 15:35   IR Radiologist Eval & Mgmt  Result Date: 02/08/2022 Please refer to notes tab for details about interventional procedure. (Op Note)  DG Chest 2 View  Result Date: 02/07/2022 CLINICAL DATA:  Right pleural effusion. EXAM: CHEST - 2 VIEW COMPARISON:  Jan 27, 2022. FINDINGS: The heart size and mediastinal contours are within normal limits. Left lung is clear. Mild right basilar subsegmental atelectasis is noted with small right pleural effusion. The visualized skeletal structures are unremarkable. IMPRESSION: Mild right basilar subsegmental atelectasis is noted with small right pleural effusion. Electronically Signed   By: Marijo Conception M.D.   On: 02/07/2022 12:44     Assessment & Plan:   Pleural effusion on right -  Patient underwent thoracentesis on 5/3 with 339m removed, consistent with exudative effusion. Pleural effusion felt to be reactive due to abdominal process.  Repeat CXR today.   Perinephric abscess - Hospitalized from 4/25-5/6 for fever and abdominal pain. Patient underwent ERCP which was complicated by micro perforation and fluid collection in gallbladder fossa. CT abdomen 01/27/22 showed fluid collection in right subhepatic perinephritic area which was essentially unchanged from previous bilateral small pleural effusions. Started on broad spectrum IV antibiotics. Underwent CT guided  right retroperitoneal drain placement. Culture grew out enterococcus and yeast. Patient transitioned to Augmentin and Difucan. She continues to have some pain on her rigiht side and dry cough. Perc drain draining 10-197mcloud white fluid. Scheduled for repeat CT/abdomen pelvis 5/19 with IR.   Dry cough - Secondary to diaphragmatic irritation. Recommend she resume dextromethorphan '30mg'$ /71m47m 12 hours for cough suppressant    EliMartyn EhrichP 03/03/2022

## 2022-02-07 NOTE — Progress Notes (Signed)
Cardiology Office Note:    Date:  02/07/2022   ID:  Laurie Terry, DOB 1965/12/31, MRN [id number]  PCP:  [name], MD  [facility] Cardiologist:  Rudean Haskell MD [place] Electrophysiologist:  None   CC: Follow up hospitalization  History of Present Illness:    Laurie Terry is a 56 y.o. female with a hx of Paroxysmal SVT, PACs, Gestational DM with fourth child, History of bicuspid aortic valve,  mld thoracic aortic aneurysm- contrast allergy.   2022: seen without significant palpitations and stable aortic size  2023: Stable MRA 11/29/21 (Aorta 4.1)  Patient notes that she had a difficulty over the past two months.   Still feels tried and weak.  With new abdominal pain.   Had fevers in April- found to have fevers and underwer ERCP: complicated by microperforation.  Returned to the hospital with fevers and WBC and had IR drainage of fluid collection (biloma).  Had RP drain placed.  Discharged one day prior to her birthday.  No chest pain or pressure.  No SOB/DOE and no PND/Orthopnea.  No weight gain or leg swelling.  No palpitations or syncope.  Had noted that in December or march had increase in her SVT burden, helped but not resolved with metoprolol.    Past Medical History:  Diagnosis Date   Allergic rhinitis    Aortic aneurysm ([place]) 09/23/2020   Cancer ([place])    Melanoma   Cervical dysplasia 2007   low-grade SIL, normal Pap smears since then   Family history of adverse reaction to anesthesia    mom has h/o post op nausea/vomiting   Intermittent palpitations    IUD    inserted 03/2002.04/06/2007, 06/03/2012   Ocular migraine    Osteomalacia    Pancreatitis    Seafood allergy     Past Surgical History:  Procedure Laterality Date   BIOPSY  01/21/2022   Procedure: BIOPSY;  Surgeon: Wilford Corner, MD;  Location: [facility];  Service: Gastroenterology;;   Lillard Anes  2018   CHOLECYSTECTOMY N/A 04/09/2016   Procedure: LAPAROSCOPIC  CHOLECYSTECTOMY ;  Surgeon: Rolm Bookbinder, MD;  Location: [place];  Service: General;  Laterality: N/A;   ERCP  04/11/2016   ERCP with pancreatic stent and failed CBD stent after needle knife and conventional sphincterotomy   ERCP N/A 04/12/2016   Procedure: ENDOSCOPIC RETROGRADE CHOLANGIOPANCREATOGRAPHY (ERCP);  Surgeon: Clarene Essex, MD;  Location: [facility];  Service: Endoscopy;  Laterality: N/A;   ERCP N/A 01/15/2022   Procedure: ENDOSCOPIC RETROGRADE CHOLANGIOPANCREATOGRAPHY (ERCP);  Surgeon: Clarene Essex, MD;  Location: [facility];  Service: Gastroenterology;  Laterality: N/A;   FLEXIBLE SIGMOIDOSCOPY N/A 01/21/2022   Procedure: FLEXIBLE SIGMOIDOSCOPY;  Surgeon: Wilford Corner, MD;  Location: [facility];  Service: Gastroenterology;  Laterality: N/A;   FOOT SURGERY     toenail   INTRAUTERINE DEVICE INSERTION  08/07/2017   Mirena   removal of melanoma     REMOVAL OF STONES  01/15/2022   Procedure: REMOVAL OF STONES;  Surgeon: Clarene Essex, MD;  Location: [facility];  Service: Gastroenterology;;   [name]  01/15/2022   Procedure: [name];  Surgeon: Clarene Essex, MD;  Location: [facility];  Service: Gastroenterology;;    Current Medications: Current Meds  Medication Sig   acetaminophen (TYLENOL) 500 MG tablet Take 1,000 mg by mouth every 6 (six) hours as needed for mild pain or fever.   albuterol (PROAIR HFA) 108 (90 Base) MCG/ACT inhaler Inhale 2 puffs into the lungs every 4 (four) hours as  needed for wheezing or shortness of breath (coughing fits).   amoxicillin-clavulanate (AUGMENTIN) 875-125 MG tablet Take 1 tablet by mouth every 12 (twelve) hours for 14 days.   chlorpheniramine-HYDROcodone 10-8 MG/5ML Take 5 mLs by mouth at bedtime for 14 doses.   cholestyramine light (PREVALITE) 4 g packet Take 1 packet (4 g total) by mouth 2 (two) times daily. (Patient taking differently: Take 4 g by mouth as needed.)   dextromethorphan (DELSYM) 30 MG/5ML liquid Take 5 mLs (30  mg total) by mouth 2 (two) times daily. (Patient taking differently: Take 30 mg by mouth as needed for cough.)   diclofenac Sodium (VOLTAREN) 1 % GEL Apply 4 g topically 4 (four) times daily.   dicyclomine (BENTYL) 10 MG capsule Take 1 capsule (10 mg total) by mouth 3 (three) times daily as needed for spasms.   eletriptan (RELPAX) 40 MG tablet 40 mg every 2 (two) hours as needed for migraine.   EPINEPHrine 0.3 mg/0.3 mL IJ SOAJ injection Inject 0.3 mg into the muscle as needed for anaphylaxis.   famotidine (PEPCID) 20 MG tablet Take 20 mg by mouth daily as needed for heartburn or indigestion.   fluconazole (DIFLUCAN) 200 MG tablet Take 2 tablets (400 mg total) by mouth daily for 11 days.   fluticasone (FLONASE) 50 MCG/ACT nasal spray Place 2 sprays into both nostrils 2 (two) times daily as needed for allergies.   ketotifen (ZADITOR) 0.025 % ophthalmic solution Place 1 drop into both eyes daily as needed (Itching eye and allergies).   metoprolol tartrate (LOPRESSOR) 25 MG tablet Take 1 tablet (25 mg total) by mouth daily as needed (svt).   Sodium Chloride Flush (NORMAL SALINE FLUSH) 0.9 % SOLN Flush right drain with 5 ml of sterile saline once daily   traMADol (ULTRAM) 50 MG tablet Take 1 tablet (50 mg total) by mouth every 6 (six) hours as needed for moderate pain.     Allergies:   Cefepime, Contrast media [iodinated contrast media], Iodine, Other, Ampicillin, Avocado, Levofloxacin, Pantoprazole, Septra [bactrim], Sulfamethoxazole-trimethoprim, Vancomycin, and Colestipol hcl   Social History   Socioeconomic History   Marital status: Married    Spouse name: Not on file   Number of children: Not on file   Years of education: Not on file   Highest education level: Not on file  Occupational History   Not on file  Tobacco Use   Smoking status: Former   Smokeless tobacco: Never  Vaping Use   Vaping Use: Never used  Substance and Sexual Activity   Alcohol use: Not Currently    Comment: Rare    Drug use: No   Sexual activity: Yes    Birth control/protection: Post-menopausal    Comment:  declined sexual hx questions,des neg  Other Topics Concern   Not on file  Social History Narrative   Not on file   Social Determinants of Health   Financial Resource Strain: Not on file  Food Insecurity: Not on file  Transportation Needs: Not on file  Physical Activity: Not on file  Stress: Not on file  Social Connections: Not on file    SOCIAL: Has four kids; one daughter lives in [place]  Family History: The patient's family history includes Arthritis in her sister; Autoimmune disease in her father; Breast cancer in her maternal grandmother; Breast cancer (age of onset: 19) in her sister; CVA in her father; Stroke in her father; Thyroid disease in her daughter and sister; Tremor in her mother.  History of coronary artery  disease notable for no members. History of heart failure notable for no members. No history of cardiomyopathies including hypertrophic cardiomyopathy, left ventricular non-compaction, or arrhythmogenic right ventricular cardiomyopathy. History of arrhythmia notable for atrial fibrillation in father. Denies family history of sudden cardiac death including drowning, car accidents, or unexplained deaths in the family.  Father died after being his by a drunk driver at age 26. Father had bicuspid aortic valve and aneurysm without dissection.   ROS:   Please see the history of present illness.     All other systems reviewed and are negative.  EKGs/Labs/Other Studies Reviewed:    The following studies were reviewed today:  EKG:   04/13/2016: Sinus Bradycardia Rate 53 WNL  Cardiac Event Monitoring: Results: Per report new PSVT (unable to pull up tracings)  Transthoracic Echocardiogram: Date: 11/07/20 Results: Mild TAA 1. Left ventricular ejection fraction, by estimation, is 60 to 65%. The  left ventricle has normal function. The left ventricle has no regional   wall motion abnormalities. Left ventricular diastolic parameters were  normal.   2. Right ventricular systolic function is normal. The right ventricular  size is normal.   3. The mitral valve is normal in structure. Trivial mitral valve  regurgitation. No evidence of mitral stenosis.   4. The aortic valve is tricuspid. Aortic valve regurgitation is not  visualized. No aortic stenosis is present.   5. Aortic dilatation noted. There is mild to moderate dilatation at the  level of the sinuses of Valsalva, measuring 43 mm. There is mild  dilatation of the ascending aorta, measuring 39 mm.   6. The inferior vena cava is normal in size with greater than 50%  respiratory variability, suggesting right atrial pressure of 3 mmHg.   Date 11/20/20 Results: 1. This is a negative stress echocardiogram for ischemia.   2. This is a low risk study.   Aortia MRA: Date: 11/22/20 Results: IMPRESSION:   - Aortic root is upper limit of normal (39 mm on my assessment). - Ascending aorta normal diameter for gender. - No evidence of Coarctation - Top-normal caliber of the aortic root by MRI measuring up to 3.8-4.0 cm at the level of the sinuses of Valsalva. No evidence of aneurysmal disease of the ascending thoracic aorta by MRI/MRA.  Recent Labs: 01/21/2022: Magnesium 1.9 01/24/2022: B Natriuretic Peptide 160.1 01/30/2022: ALT 15 02/01/2022: BUN 11; Creatinine, Ser 0.53; Hemoglobin 10.8; Platelets 541; Potassium 3.9; Sodium 138  Recent Lipid Panel    Component Value Date/Time   CHOL 147 04/07/2016 0331   TRIG 57 04/07/2016 0331   HDL 48 04/07/2016 0331   CHOLHDL 3.1 04/07/2016 0331   VLDL 11 04/07/2016 0331   LDLCALC 88 04/07/2016 0331    Physical Exam:    VS:  BP 90/60   Pulse 78   Ht '5\' 5"'$  (1.651 m)   Wt 136 lb (61.7 kg)   SpO2 98%   BMI 22.63 kg/m     Wt Readings from Last 3 Encounters:  02/07/22 136 lb (61.7 kg)  01/27/22 150 lb (68 kg)  01/21/22 149 lb 0.5 oz (67.6 kg)    Gen: no  distress  Neck: No JVD Ears: no Pilar Plate Sign Cardiac: No Rubs or Gallops, no Murmur, RRR +2radial pulses Respiratory: Clear to auscultation bilaterally, normal effort, normal  respiratory rate GI: Soft, nontender, drain still in place MS: No  edema;  moves all extremities Integument: Skin feels warm Neuro:  At time of evaluation, alert and oriented to person/place/time/situation  Psych:  Normal affect, patient feels in reasonable spirits considering all she has been through   ASSESSMENT:    1. Thoracic aortic aneurysm without rupture, unspecified part ([place])   2. PAC (premature atrial contraction)   3. Paroxysmal SVT (supraventricular tachycardia) ([facility])      PLAN:    Asymptomatic thoracic aortic dilation with iodine allergy Family history of bicuspid aortic valve PACs and P-SVT Hypotension Resolved Hyperlipidemia (mixed) - hypotension likely related to her recent hospitalization and infection, continues fluids, has had BP similar to this prior to infection - Last at aortic size 41 mm - MRA planned for 11/2021 -LDL goal less than 100, she had made that with weight loss prior to her infection (9/22) Has metoprolol 25 mg PO PRN palpitations but has not needed - if SVT gets worse: repeat monitor and SPECT Stress test; she may need Ic in the future  April 2023 follow up unless new symptoms or abnormal test results warranting change in plan     Medication Adjustments/Labs and Tests Ordered: Current medicines are reviewed at length with the patient today.  Concerns regarding medicines are outlined above.  Orders Placed This Encounter  Procedures   MR Angiogram Chest W Wo Contrast    No orders of the defined types were placed in this encounter.    Patient Instructions  Medication Instructions:  Your physician recommends that you continue on your current medications as directed. Please refer to the Current Medication list given to you today.  *If you need a refill on your  cardiac medications before your next appointment, please call your pharmacy*   Lab Work: NONE If you have labs (blood work) drawn today and your tests are completely normal, you will receive your results only by: [place] (if you have MyChart) OR A paper copy in the mail If you have any lab test that is abnormal or we need to change your treatment, we will call you to review the results.   Testing/Procedures: MARCH 2024-  Your physician has requested that you have a Cardiac MRA.  Follow-Up: At [facility], you and your health needs are our priority.  As part of our continuing mission to provide you with exceptional heart care, we have created designated Provider Care Teams.  These Care Teams include your primary Cardiologist (physician) and Advanced Practice Providers (APPs -  Physician Assistants and Nurse Practitioners) who all work together to provide you with the care you need, when you need it.    Your next appointment:   10-11 month(s)  The format for your next appointment:   In Person  Provider:   Rudean Haskell, MD   Important Information About Sugar          Signed, Werner Lean, MD  02/07/2022 8:32 AM    [place]

## 2022-02-07 NOTE — Patient Instructions (Signed)
Recommendations: Continue Augmentin Resume dextromethorphan (delsym) q 12 hours for cough  Orders: CXR today  Follow-up: As needed

## 2022-02-07 NOTE — Patient Instructions (Signed)
Medication Instructions:  Your physician recommends that you continue on your current medications as directed. Please refer to the Current Medication list given to you today.  *If you need a refill on your cardiac medications before your next appointment, please call your pharmacy*   Lab Work: NONE If you have labs (blood work) drawn today and your tests are completely normal, you will receive your results only by: [place] (if you have MyChart) OR A paper copy in the mail If you have any lab test that is abnormal or we need to change your treatment, we will call you to review the results.   Testing/Procedures: MARCH 2024-  Your physician has requested that you have a Cardiac MRA.  Follow-Up: At [facility], you and your health needs are our priority.  As part of our continuing mission to provide you with exceptional heart care, we have created designated Provider Care Teams.  These Care Teams include your primary Cardiologist (physician) and Advanced Practice Providers (APPs -  Physician Assistants and Nurse Practitioners) who all work together to provide you with the care you need, when you need it.    Your next appointment:   10-11 month(s)  The format for your next appointment:   In Person  Provider:   Rudean Haskell, MD   Important Information About Sugar

## 2022-02-07 NOTE — Assessment & Plan Note (Signed)
She's doing well in clinic today. No fever and minimal tenderness.  She needs reepat CT which she will get tomorrow.  This will determine need for continued drainage, continued anbx, or even surgery.  I will ask IR to let us know results so we can assist in letting her know how much longer to continue.  She will f/u with Dr Juleen China as scheduled.

## 2022-02-08 ENCOUNTER — Ambulatory Visit
Admission: RE | Admit: 2022-02-08 | Discharge: 2022-02-08 | Disposition: A | Discharge status: Home / Self Care | Payer: 59 | Source: Ambulatory Visit | Attending: Internal Medicine | Admitting: Internal Medicine

## 2022-02-08 ENCOUNTER — Encounter: Payer: Self-pay | Admitting: Internal Medicine

## 2022-02-08 ENCOUNTER — Ambulatory Visit
Admission: RE | Admit: 2022-02-08 | Discharge: 2022-02-08 | Disposition: A | Discharge status: Home / Self Care | Payer: 59 | Source: Ambulatory Visit | Attending: Radiology | Admitting: Radiology

## 2022-02-08 ENCOUNTER — Encounter: Payer: Self-pay | Admitting: Radiology

## 2022-02-08 ENCOUNTER — Telehealth: Payer: Self-pay | Admitting: Student

## 2022-02-08 ENCOUNTER — Other Ambulatory Visit: Payer: Self-pay | Admitting: Physician Assistant

## 2022-02-08 ENCOUNTER — Other Ambulatory Visit (HOSPITAL_COMMUNITY): Payer: Self-pay | Admitting: Interventional Radiology

## 2022-02-08 DIAGNOSIS — B999 Unspecified infectious disease: Secondary | ICD-10-CM

## 2022-02-08 DIAGNOSIS — K9189 Other postprocedural complications and disorders of digestive system: Secondary | ICD-10-CM

## 2022-02-08 DIAGNOSIS — K831 Obstruction of bile duct: Secondary | ICD-10-CM

## 2022-02-08 HISTORY — PX: IR RADIOLOGIST EVAL & MGMT: IMG5224

## 2022-02-08 MED ORDER — DIPHENHYDRAMINE HCL 50 MG PO CAPS
ORAL_CAPSULE | ORAL | 0 refills | Status: DC
Start: 2022-02-08 — End: 2022-03-05

## 2022-02-08 MED ORDER — PREDNISONE 50 MG PO TABS: ORAL_TABLET | ORAL | 0 refills | Status: DC

## 2022-02-08 NOTE — Progress Notes (Signed)
Please let patient know CXR showed small right pleural effusion, encourage deep breathing exercise if able 10x/hr. Cont delsym. Would repeat CXR in 4 weeks to ensure resolution after she is done abx and perc drain comes out

## 2022-02-08 NOTE — Progress Notes (Signed)
Chief Complaint: Patient was seen in consultation today for right perinephric abscess with drain in place at the request of [facility]  Referring Physician(s): Bruning,Kevin  History of Present Illness: Laurie Terry is a 56 y.o. female with a history of gallstone pancreatitis treated by laparoscopic cholecystectomy.  This operation was complicated by a small amount of fluid within the gallbladder resection bed which was aspirated by interventional radiology in 2017.  Cultures were negative.  In April 2023 she underwent ERCP for choledocholithiasis which was complicated by microperforation and further gallbladder fossa fluid collection treated with antibiotic therapy.  She then returned to the hospital on 5/7 with fever and increasing right upper quadrant pain despite antibiotics.  Follow-up CT imaging demonstrates a large complex fluid collection in the right perinephric retroperitoneal space concerning for abscess.  This was then subsequently drained with a percutaneous drain placement on 01/30/2022.  She presents today for follow-up.  Output remains thick and purulent and approximately 20-25 mL daily.  She is flushing the drainage catheter once or twice daily.    She has persistent low-grade temperatures of 99-100 degrees.   Additionally, she is starting to develop increasing pain  That she feels is on the inside radiating from about where the drain is placed upward toward her scapula.  Additionally, she has pain with deep inspiration.    Past Medical History:  Diagnosis Date   Allergic rhinitis    Aortic aneurysm ([place]) 09/23/2020   Cancer ([place])    Melanoma   Cervical dysplasia 2007   low-grade SIL, normal Pap smears since then   Family history of adverse reaction to anesthesia    mom has h/o post op nausea/vomiting   Intermittent palpitations    IUD    inserted 03/2002.04/06/2007, 06/03/2012   Ocular migraine    Osteomalacia    Pancreatitis    Seafood allergy     Past  Surgical History:  Procedure Laterality Date   BIOPSY  01/21/2022   Procedure: BIOPSY;  Surgeon: Wilford Corner, MD;  Location: [facility];  Service: Gastroenterology;;   Lillard Anes  2018   CHOLECYSTECTOMY N/A 04/09/2016   Procedure: LAPAROSCOPIC CHOLECYSTECTOMY ;  Surgeon: Rolm Bookbinder, MD;  Location: [facility];  Service: General;  Laterality: N/A;   ERCP  04/11/2016   ERCP with pancreatic stent and failed CBD stent after needle knife and conventional sphincterotomy   ERCP N/A 04/12/2016   Procedure: ENDOSCOPIC RETROGRADE CHOLANGIOPANCREATOGRAPHY (ERCP);  Surgeon: Clarene Essex, MD;  Location: [facility];  Service: Endoscopy;  Laterality: N/A;   ERCP N/A 01/15/2022   Procedure: ENDOSCOPIC RETROGRADE CHOLANGIOPANCREATOGRAPHY (ERCP);  Surgeon: Clarene Essex, MD;  Location: [facility];  Service: Gastroenterology;  Laterality: N/A;   FLEXIBLE SIGMOIDOSCOPY N/A 01/21/2022   Procedure: FLEXIBLE SIGMOIDOSCOPY;  Surgeon: Wilford Corner, MD;  Location: [facility];  Service: Gastroenterology;  Laterality: N/A;   FOOT SURGERY     toenail   INTRAUTERINE DEVICE INSERTION  08/07/2017   Mirena   IR RADIOLOGIST EVAL & MGMT  02/08/2022   removal of melanoma     REMOVAL OF STONES  01/15/2022   Procedure: REMOVAL OF STONES;  Surgeon: Clarene Essex, MD;  Location: [facility];  Service: Gastroenterology;;   [name]  01/15/2022   Procedure: [name];  Surgeon: Clarene Essex, MD;  Location: [facility];  Service: Gastroenterology;;    Allergies: Cefepime, Contrast media [iodinated contrast media], Iodine, Other, Ampicillin, Avocado, Levofloxacin, Pantoprazole, Septra [bactrim], Sulfamethoxazole-trimethoprim, Vancomycin, and Colestipol hcl  Medications: Prior to Admission medications   Medication Sig  Start Date End Date Taking? Authorizing Provider  acetaminophen (TYLENOL) 500 MG tablet Take 1,000 mg by mouth every 6 (six) hours as needed for mild pain or fever.    [provider]   albuterol (PROAIR HFA) 108 (90 Base) MCG/ACT inhaler Inhale 2 puffs into the lungs every 4 (four) hours as needed for wheezing or shortness of breath (coughing fits). 05/23/21   Garnet Sierras, DO  amoxicillin-clavulanate (AUGMENTIN) 875-125 MG tablet Take 1 tablet by mouth every 12 (twelve) hours for 14 days. 01/31/22 02/14/22  Mignon Pine, DO  chlorpheniramine-HYDROcodone 10-8 MG/5ML Take 5 mLs by mouth at bedtime for 14 doses. 02/02/22 02/16/22  Debbe Odea, MD  cholestyramine light (PREVALITE) 4 g packet Take 1 packet (4 g total) by mouth 2 (two) times daily. Patient taking differently: Take 4 g by mouth as needed. 01/26/22   Regalado, Belkys A, MD  dextromethorphan (DELSYM) 30 MG/5ML liquid Take 5 mLs (30 mg total) by mouth 2 (two) times daily. Patient taking differently: Take 30 mg by mouth as needed for cough. 02/02/22   Debbe Odea, MD  diclofenac Sodium (VOLTAREN) 1 % GEL Apply 4 g topically 4 (four) times daily. 02/02/22   Debbe Odea, MD  dicyclomine (BENTYL) 10 MG capsule Take 1 capsule (10 mg total) by mouth 3 (three) times daily as needed for spasms. 02/02/22 03/04/22  Debbe Odea, MD  eletriptan (RELPAX) 40 MG tablet 40 mg every 2 (two) hours as needed for migraine. 05/18/20   [provider]  EPINEPHrine 0.3 mg/0.3 mL IJ SOAJ injection Inject 0.3 mg into the muscle as needed for anaphylaxis.    [provider]  famotidine (PEPCID) 20 MG tablet Take 20 mg by mouth daily as needed for heartburn or indigestion.    [provider]  fluconazole (DIFLUCAN) 200 MG tablet Take 2 tablets (400 mg total) by mouth daily for 11 days. 02/03/22 02/14/22  Debbe Odea, MD  fluticasone (FLONASE) 50 MCG/ACT nasal spray Place 2 sprays into both nostrils 2 (two) times daily as needed for allergies. 07/14/20   [provider]  ketotifen (ZADITOR) 0.025 % ophthalmic solution Place 1 drop into both eyes daily as needed (Itching eye and allergies).    [provider]  metoprolol tartrate (LOPRESSOR) 25 MG tablet Take 1 tablet (25 mg total) by mouth daily as needed (svt). 01/26/22   Regalado, Belkys A, MD  Sodium Chloride Flush (NORMAL SALINE FLUSH) 0.9 % SOLN Flush right drain with 5 ml of sterile saline once daily 02/01/22     traMADol (ULTRAM) 50 MG tablet Take 1 tablet (50 mg total) by mouth every 6 (six) hours as needed for moderate pain. 02/02/22   Debbe Odea, MD     Family History  Problem Relation Age of Onset   Tremor Mother    Stroke Father    Autoimmune disease Father    CVA Father    Breast cancer Sister 36   Arthritis Sister        Psoriatic   Thyroid disease Sister    Breast cancer Maternal Grandmother        Age 73   Thyroid disease Daughter        Hypo    Social History   Socioeconomic History   Marital status: Married    Spouse name: Not on file   Number of children: Not on file   Years of education: Not on file   Highest education level: Not on file  Occupational History  Not on file  Tobacco Use   Smoking status: Former   Smokeless tobacco: Never  Vaping Use   Vaping Use: Never used  Substance and Sexual Activity   Alcohol use: Not Currently    Comment: Rare   Drug use: No   Sexual activity: Yes    Birth control/protection: Post-menopausal    Comment:  declined sexual hx questions,des neg  Other Topics Concern   Not on file  Social History Narrative   Not on file   Social Determinants of Health   Financial Resource Strain: Not on file  Food Insecurity: Not on file  Transportation Needs: Not on file  Physical Activity: Not on file  Stress: Not on file  Social Connections: Not on file    Review of Systems: A 12 point ROS discussed and pertinent positives are indicated in the HPI above.  All other systems are negative.  Review of Systems  Vital Signs: There were no vitals taken for this visit.  Physical Exam Constitutional:      General: She is not in acute distress.    Appearance: Normal  appearance. She is not toxic-appearing.  HENT:     Head: Normocephalic and atraumatic.  Eyes:     General: No scleral icterus. Cardiovascular:     Rate and Rhythm: Normal rate.  Pulmonary:     Effort: Pulmonary effort is normal.  Abdominal:     General: Abdomen is flat.     Palpations: Abdomen is soft.  Musculoskeletal:       Back:     Comments: Drain entry site clean. Dry and intact.  No induration or significant TTP.   Skin:    General: Skin is warm and dry.  Neurological:     Mental Status: She is alert and oriented to person, place, and time.  Psychiatric:        Mood and Affect: Mood normal.        Behavior: Behavior normal.      Imaging: CT Abdomen Pelvis Wo Contrast  Result Date: 01/27/2022 CLINICAL DATA:  Abdominal pain. Right-sided retroperitoneal inflammatory process after ERCP with persistent fever. Status post recent right thoracentesis. EXAM: CT ABDOMEN AND PELVIS WITHOUT CONTRAST TECHNIQUE: Multidetector CT imaging of the abdomen and pelvis was performed following the standard protocol without IV contrast. RADIATION DOSE REDUCTION: This exam was performed according to the departmental dose-optimization program which includes automated exposure control, adjustment of the mA and/or kV according to patient size and/or use of iterative reconstruction technique. COMPARISON:  CT abdomen pelvis dated 01/24/2022. FINDINGS: Evaluation of this exam is limited in the absence of intravenous contrast. Lower chest: Small bilateral pleural effusions similar to prior CT. There is associated partial compressive atelectasis of the lower lobes. No intra-abdominal free air. Hepatobiliary: The liver is unremarkable. Cholecystectomy. Similar pneumobilia. Slight interval decrease in the subhepatic fluid collection compared to prior CT. This collection extends down along the right paracolic gutter similar to prior CT. Pancreas: Unremarkable. No pancreatic ductal dilatation or surrounding  inflammatory changes. Spleen: Normal in size without focal abnormality. Adrenals/Urinary Tract: The left adrenal glands unremarkable. The right adrenal gland is not visualized. The left kidney is unremarkable. Punctate nonobstructing stone in the upper pole of the right kidney. No hydronephrosis. Similar appearance of right perinephric fluid collection as the prior CT. This may be related to prior hematoma. The urinary bladder is unremarkable. Stomach/Bowel: No bowel obstruction or active inflammation. The appendix is normal. Vascular/Lymphatic: IVC are unremarkable on this noncontrast CT. No portal  venous gas. There is no adenopathy. Reproductive: The uterus and ovaries are grossly unremarkable. No adnexal masses. Other: None Musculoskeletal: Mild degenerative changes of the spine. No acute osseous pathology. IMPRESSION: 1. No significant interval change in the right subhepatic and perinephric fluid collection compared to prior CT. 2. Small bilateral pleural effusions similar to prior CT.  A 3. Punctate nonobstructing right renal upper pole stone. No hydronephrosis. 4. No bowel obstruction. Normal appendix. Electronically Signed   By: Anner Crete M.D.   On: 01/27/2022 22:42   CT ABDOMEN PELVIS WO CONTRAST  Result Date: 01/24/2022 CLINICAL DATA:  Right-sided retroperitoneal inflammatory process after ERCP with persistent fever. Status post right thoracentesis yesterday. Evaluation of inflammatory process for drainable abscess and repeat CT with oral contrast to evaluate for duodenal leak. The patient was pre-medicated for administration of IV contrast due to prior history significant allergic reaction but decision was made to not administer IV contrast today due to some persistent symptoms related to allergic reaction to IV antibiotic administration the previous night. EXAM: CT ABDOMEN AND PELVIS WITHOUT CONTRAST TECHNIQUE: Multidetector CT imaging of the abdomen and pelvis was performed following the  standard protocol without IV contrast. RADIATION DOSE REDUCTION: This exam was performed according to the departmental dose-optimization program which includes automated exposure control, adjustment of the mA and/or kV according to patient size and/or use of iterative reconstruction technique. COMPARISON:  01/21/2022 FINDINGS: Lower chest: Significant reduction in right pleural fluid volume at the lung base after thoracentesis yesterday with only a trace amount of fluid remaining. There is a smaller trace left pleural effusion. Hepatobiliary: Stable pneumobilia in the left lobe of the liver after prior ERCP. Clips from prior cholecystectomy. Small amount of air bubbles in the gallbladder fossa adjacent to cholecystectomy clips and extending towards the duodenum appear unchanged and there is no significant fluid collection associated with the gallbladder fossa. Pancreas: Unremarkable. No pancreatic ductal dilatation or surrounding inflammatory changes. Spleen: Normal in size without focal abnormality. Adrenals/Urinary Tract: Adrenal glands are unremarkable. Kidneys are normal, without renal calculi, focal lesion, or hydronephrosis. Bladder is unremarkable. Stomach/Bowel: At the time of imaging, most of the ingested oral contrast is within the small bowel and some diluted contrast in the colon. There also is a small amount of residual diluted contrast in the stomach. There is no evidence to suggest overt leakage of oral contrast from the GI tract and particularly the duodenum. No evidence of bowel obstruction or significant ileus. Vascular/Lymphatic: No significant vascular findings are present. No enlarged abdominal or pelvic lymph nodes. Reproductive: Uterus and bilateral adnexa are unremarkable. Other: Inflammatory process centered in the posterior right retroperitoneal space abutting the medial margin of the lower liver, superior aspect of the right kidney and the posterior duodenum appears stable since the prior  study with no significant progression or development discrete marginated fluid collection. Dependent free fluid in the pelvis appears slightly smaller in volume compared to the prior study. No free intraperitoneal air. Musculoskeletal: No acute or significant osseous findings. IMPRESSION: 1. Reduction in right pleural fluid volume after thoracentesis yesterday. Trace bilateral pleural fluid remains. 2. Stable small amount of air bubbles in the gallbladder fossa adjacent to cholecystectomy tips and extending towards the duodenum. No significant fluid collection associated with the gallbladder fossa. 3. Stable inflammatory process of the right retroperitoneal space abutting the lower liver, superior right kidney and posterior duodenum. This shows no significant progression since the 01/21/2022 scan and no discrete enlarging marginated fluid collection is identified. It is difficult  to evaluate for discrete abscess without IV contrast. 4. No evidence of oral contrast leak to suggest overt bowel leak, specifically at the level of the duodenum. 5. Decreased volume of free fluid in the pelvis. Electronically Signed   By: Aletta Edouard M.D.   On: 01/24/2022 12:46   CT ABDOMEN PELVIS WO CONTRAST  Result Date: 01/21/2022 CLINICAL DATA:  Abdominal pain, nausea.  Recent lipase is normal. EXAM: CT ABDOMEN AND PELVIS WITHOUT CONTRAST TECHNIQUE: Multidetector CT imaging of the abdomen and pelvis was performed following the standard protocol without IV contrast. RADIATION DOSE REDUCTION: This exam was performed according to the departmental dose-optimization program which includes automated exposure control, adjustment of the mA and/or kV according to patient size and/or use of iterative reconstruction technique. COMPARISON:  01/15/2022 FINDINGS: Lower chest: New moderate right and smaller left pleural effusions, with adjacent atelectasis/consolidation in the lower lobes. Hepatobiliary: Cholecystectomy clips. Gas in the  biliary tree which is nondilated. Poorly marginated loculated gas and fluid noted in the gallbladder fossa, increased since prior CT. No new liver lesion. Pancreas: Unremarkable. No pancreatic ductal dilatation or surrounding inflammatory changes. Spleen: Normal in size without focal abnormality. Adrenals/Urinary Tract: No adrenal mass. Left kidney remains unremarkable. 1 mm calculus in the upper pole right renal collecting system without hydronephrosis or ureterectasis. Urinary bladder physiologically distended. Stomach/Bowel: Stomach decompressed. Small bowel is nondistended. Appendix not identified. The colon is nondilated, unremarkable. Vascular/Lymphatic: No significant vascular findings are present. No enlarged abdominal or pelvic lymph nodes. Reproductive: Uterus and bilateral adnexa are unremarkable. Other: Bilateral pelvic phleboliths. Small volume pelvic ascites. No free air. Since the prior scan there has been significant development of right retroperitoneal fluid inferior to the liver and lateral to the right kidney. Musculoskeletal: Mild spondylitic changes in the lower lumbar spine. IMPRESSION: 1. Increasing loculated gas and fluid in the gallbladder fossa. 2. New right retroperitoneal fluid inferior to the liver and lateral to the kidney. 3. New moderate  right and small left pleural effusions. 4. Small volume pelvic ascites, new since prior study. 5. Nonobstructive right urolithiasis. Electronically Signed   By: Lucrezia Europe M.D.   On: 01/21/2022 17:40   DG Chest 2 View  Result Date: 02/07/2022 CLINICAL DATA:  Right pleural effusion. EXAM: CHEST - 2 VIEW COMPARISON:  Jan 27, 2022. FINDINGS: The heart size and mediastinal contours are within normal limits. Left lung is clear. Mild right basilar subsegmental atelectasis is noted with small right pleural effusion. The visualized skeletal structures are unremarkable. IMPRESSION: Mild right basilar subsegmental atelectasis is noted with small right  pleural effusion. Electronically Signed   By: Marijo Conception M.D.   On: 02/07/2022 12:44   DG Ribs Unilateral W/Chest Right  Result Date: 01/27/2022 CLINICAL DATA:  History of recent discharge from treatment of perforation from ERCP, presenting with fever and lower right rib tenderness. EXAM: RIGHT RIBS AND CHEST - 3+ VIEW COMPARISON:  Jan 24, 2022 FINDINGS: A radiopaque marker was placed at the site of the patient's pain. No fracture or other bone lesions are seen involving the ribs. There is no evidence of pneumothorax or pleural effusion. There is mild elevation of the right hemidiaphragm. Small, predominantly stable bilateral pleural effusions are seen. There is no evidence of an acute infiltrate or pneumothorax. Heart size and mediastinal contours are within normal limits. Radiopaque surgical clips are seen within the right upper quadrant. IMPRESSION: 1. No acute osseous abnormality. 2. Small, predominantly stable bilateral pleural effusions. Electronically Signed   By: Hoover Browns  Houston M.D.   On: 01/27/2022 17:51   DG Abd 1 View  Result Date: 01/16/2022 CLINICAL DATA:  Epigastric pain EXAM: ABDOMEN - 1 VIEW COMPARISON:  Abdominal radiograph dated May 03, 2016 FINDINGS: Visualized bowel gas pattern is normal. Inferior pelvis is not included in the field of view. Cholecystectomy clips. No radio-opaque calculi or other significant radiographic abnormality are seen. IMPRESSION: Nonobstructive bowel gas pattern. Electronically Signed   By: Yetta Glassman M.D.   On: 01/16/2022 13:41   MR 3D Recon At Scanner  Result Date: 02/04/2022 CLINICAL DATA:  Right upper quadrant abdominal pain. Right perinephric fluid collection on CT first demonstrated 01/21/2022, new from previous CT. Intra-abdominal abscess suspected following ERCP. EXAM: 3-DIMENSIONAL MR IMAGE RENDERING ON ACQUISITION WORKSTATION TECHNIQUE: 3-dimensional MR images were rendered by post-processing of the original MR data on an acquisition  workstation. The 3-dimensional MR images were interpreted and findings were reported in the accompanying complete MR report for this study COMPARISON:  None Available. FINDINGS: See results for MRI abdomen with and without contrast. IMPRESSION: See separate results for MRI of the abdomen with and without contrast. Electronically Signed   By: Richardean Sale M.D.   On: 02/04/2022 09:02   DG CHEST PORT 1 VIEW  Result Date: 01/24/2022 CLINICAL DATA:  Cough EXAM: PORTABLE CHEST 1 VIEW COMPARISON:  01/23/2022 FINDINGS: Cardiac size is within normal limits. There are no signs of pulmonary edema or focal pulmonary consolidation. Small bilateral pleural effusions are seen. Right hemidiaphragm is elevated. There is no pneumothorax. No significant interval changes are noted. IMPRESSION: Small bilateral pleural effusions. There are no new infiltrates or signs of pulmonary edema. Electronically Signed   By: Elmer Picker M.D.   On: 01/24/2022 08:57   DG CHEST PORT 1 VIEW  Result Date: 01/23/2022 CLINICAL DATA:  Status post thoracentesis EXAM: PORTABLE CHEST 1 VIEW COMPARISON:  Previous studies including the examination of 01/20/2022 FINDINGS: There is interval decrease in bilateral pleural effusions. Blunting of both lateral CP angles suggests small bilateral pleural effusions. There is no pneumothorax. There is improvement in aeration of left lower lung fields. Small linear densities seen in both lower lung fields. There are no signs of pulmonary edema. IMPRESSION: There is interval decrease in bilateral pleural effusions. There is no pneumothorax. Small linear densities in the lower lung fields suggest subsegmental atelectasis. There are no signs of pulmonary edema or new focal consolidation. Electronically Signed   By: Elmer Picker M.D.   On: 01/23/2022 14:33   DG CHEST PORT 1 VIEW  Result Date: 01/20/2022 CLINICAL DATA:  Difficulty breathing EXAM: PORTABLE CHEST 1 VIEW COMPARISON:  CT from 01/16/2022  FINDINGS: Cardiac shadow is stable. Bilateral pleural effusions are now seen new from the prior exam right greater than left. Underlying atelectatic changes are likely present as well. No acute bony abnormality is noted. IMPRESSION: New increasing effusions bilaterally right greater than left with underlying atelectatic changes. Electronically Signed   By: Inez Catalina M.D.   On: 01/20/2022 23:38   DG ERCP  Result Date: 01/15/2022 CLINICAL DATA:  56 year old female undergoing ERCP, history of abdominal pain. EXAM: ERCP TECHNIQUE: Multiple spot images obtained with the fluoroscopic device and submitted for interpretation post-procedure. FLUOROSCOPY TIME:  Fluoroscopy Time:  42 seconds Radiation Exposure Index (if provided by the fluoroscopic device): 35.86 mGy Number of Acquired Spot Images: 14 COMPARISON:  MRCP from 12/16/2021 FINDINGS: Duodenal scope is positioned over the second portion the duodenum. There is retrograde cannulation of the common bile duct and  intrahepatic biliary tree. Retrograde cholangiogram demonstrates mild extrahepatic biliary ductal dilation. Evaluation of the mid and distal common bile duct is limited by overlying duodenal scope. Cholecystectomy clips are present. IMPRESSION: Retrograde cholangiogram demonstrating mild extrahepatic biliary ductal dilation with limited visualization of the mid to distal common bile duct. These images were submitted for radiologic interpretation only. Please see the procedural report for the full procedural details, amount of contrast, and the fluoroscopy time utilized. Electronically Signed   By: Ruthann Cancer M.D.   On: 01/15/2022 14:07   DG UGI W SINGLE CM (SOL OR THIN BA)  Result Date: 01/17/2022 CLINICAL DATA:  Post ERCP with stone removal. Epigastric pain. Concern for duodenal leak. EXAM: UPPER GI SERIES WITH KUB TECHNIQUE: After obtaining a scout radiograph a routine upper GI series was performed using water-soluble contrast FLUOROSCOPY:  Radiation Exposure Index (as provided by the fluoroscopic device): 43.3 COMPARISON:  CT 01/16/2022, ERCP 01/15/2022 FINDINGS: Oral contrast flows freely through the esophagus into the stomach. Normal orientation of the stomach. Contrast flows into the first and second portion of the duodenum without evidence of leak. Contrast subsequently flows into the third and fourth portion the duodenum and proximal small bowel. No extraluminal contrast to suggest duodenal leak or injury. Contrast does reflux into the common bile duct and common hepatic duct. Contrast reflux into the cystic duct. Small gas bubbles noted in the common bile duct. No stones identified. Patient was pre-medicated for IV contrast allergy. No adverse reactions. IMPRESSION: 1. No evidence of duodenal leak or perforation. 2. Contrast refluxes into the common bile duct, common hepatic duct, and cystic duct. 3. Small gas bubbles in the common bile duct. Electronically Signed   By: Suzy Bouchard M.D.   On: 01/17/2022 09:12   MR ABDOMEN MRCP W WO CONTAST  Result Date: 01/28/2022 CLINICAL DATA:  Right upper quadrant abdominal pain. Right perinephric fluid collection on CT, 1st demonstrated 01/21/2022, new from previous CT 01/15/2022. Intra-abdominal abscess suspected following ERCP. EXAM: MRI ABDOMEN WITHOUT AND WITH CONTRAST (INCLUDING MRCP) TECHNIQUE: Multiplanar multisequence MR imaging of the abdomen was performed both before and after the administration of intravenous contrast. Heavily T2-weighted images of the biliary and pancreatic ducts were obtained, and three-dimensional MRCP images were rendered by post processing. CONTRAST:  78m GADAVIST GADOBUTROL 1 MMOL/ML IV SOLN COMPARISON:  Noncontrast abdominopelvic CT 01/27/2022, 01/24/2022, 01/21/2022 and 01/16/2022. Abdominal MRI 12/16/2021 head FINDINGS: Lower chest: Small right-greater-than-left pleural effusions, mildly improved from previous CTs and similar to yesterday's examination. Mild right  lower lobe atelectasis. Hepatobiliary: The liver is normal in signal, without steatosis, abnormal enhancement or focal lesion. Status post cholecystectomy. No significant biliary dilatation. In correlation with recent CT, mild pneumobilia, without evidence of choledocholithiasis. Pancreas: Unremarkable. No pancreatic ductal dilatation or surrounding inflammatory changes. Spleen: Normal in size without focal abnormality. Adrenals/Urinary Tract: Both adrenal glands appear normal. Again demonstrated is a large complex multiloculated fluid collection in the subhepatic space which appears to involve the right perinephric space as well. This collection demonstrates no intrinsic T1 shortening to suggest hemorrhage. Following contrast, there is diffuse peripheral enhancement of this collection, suspicious for an infected fluid collection. Because of the shape of this collection, it is difficult to accurately measure, although there is a component superior to the right kidney which measures up to 5.2 x 3.1 cm on image 32/21. Inferior to the right kidney, there is a component measuring up to 4.4 x 5.7 cm on image 66/21. This collection is not significantly changed from the recent  CT, and is new from 01/15/2022. No perinephric fluid collections are seen on the left. No evidence of hydronephrosis. Probable small incidental cyst in the interpolar region of the right kidney. Bladder not imaged. Stomach/Bowel: The stomach appears unremarkable for its degree of distension. No evidence of bowel wall thickening, distention or surrounding inflammatory change. Vascular/Lymphatic: There are no enlarged abdominal lymph nodes. No evidence of acute vascular thrombosis or aneurysm. Other: No other intra-abdominal fluid collections are identified. No ascites. Musculoskeletal: No acute or significant osseous findings. IMPRESSION: 1. Stable size of large complex right perinephric fluid collection with heterogeneous peripheral enhancement  following contrast, highly suspicious for an infected fluid collection. Differential considerations include infected urinoma and infected biloma given the patient's recent biliary intervention. As noted above, this collection is new from 01/15/2022 and not significantly changed in size from the most recent noncontrast abdominal CTs. 2. No hydronephrosis or significant biliary dilatation. 3. Small right-greater-than-left pleural effusions with associated bibasilar atelectasis. Electronically Signed   By: Richardean Sale M.D.   On: 01/28/2022 19:05   ECHOCARDIOGRAM COMPLETE  Result Date: 01/23/2022    ECHOCARDIOGRAM REPORT   Patient Name:   ANGALA HILGERS [facility] Date of Exam: 01/23/2022 Medical Rec #:  [id number]           Height:       64.0 in Accession #:    [id number]          Weight:       149.0 lb Date of Birth:  1966-03-01           BSA:          1.726 m Patient Age:    55 years            BP:           109/62 mmHg Patient Gender: F                   HR:           89 bpm. Exam Location:  Inpatient Procedure: 2D Echo, Cardiac Doppler and Color Doppler Indications:     CHF  History:         Patient has prior history of Echocardiogram examinations, most                  recent 05/24/2021.  Sonographer:     Jefferey Pica Referring Phys:  [id number] Brand Males Diagnosing Phys: Candee Furbish MD IMPRESSIONS  1. Left ventricular ejection fraction, by estimation, is 60 to 65%. The left ventricle has normal function. The left ventricle has no regional wall motion abnormalities. Left ventricular diastolic parameters are consistent with Grade I diastolic dysfunction (impaired relaxation).  2. Right ventricular systolic function is normal. The right ventricular size is normal.  3. The mitral valve is normal in structure. No evidence of mitral valve regurgitation. No evidence of mitral stenosis.  4. The aortic valve is normal in structure. Aortic valve regurgitation is not visualized. No aortic stenosis is present.  5. The  inferior vena cava is dilated in size with <50% respiratory variability, suggesting right atrial pressure of 15 mmHg. FINDINGS  Left Ventricle: Left ventricular ejection fraction, by estimation, is 60 to 65%. The left ventricle has normal function. The left ventricle has no regional wall motion abnormalities. The left ventricular internal cavity size was normal in size. There is  no left ventricular hypertrophy. Left ventricular diastolic parameters are consistent with Grade I diastolic dysfunction (impaired relaxation). Right Ventricle: The right ventricular  size is normal. No increase in right ventricular wall thickness. Right ventricular systolic function is normal. Left Atrium: Left atrial size was normal in size. Right Atrium: Right atrial size was normal in size. Pericardium: There is no evidence of pericardial effusion. Mitral Valve: The mitral valve is normal in structure. No evidence of mitral valve regurgitation. No evidence of mitral valve stenosis. Tricuspid Valve: The tricuspid valve is normal in structure. Tricuspid valve regurgitation is trivial. No evidence of tricuspid stenosis. Aortic Valve: The aortic valve is normal in structure. Aortic valve regurgitation is not visualized. No aortic stenosis is present. Aortic valve peak gradient measures 5.8 mmHg. Pulmonic Valve: The pulmonic valve was normal in structure. Pulmonic valve regurgitation is not visualized. No evidence of pulmonic stenosis. Aorta: The aortic root is normal in size and structure. Venous: The inferior vena cava is dilated in size with less than 50% respiratory variability, suggesting right atrial pressure of 15 mmHg. IAS/Shunts: No atrial level shunt detected by color flow Doppler.  LEFT VENTRICLE PLAX 2D LVIDd:         4.50 cm   Diastology LVIDs:         3.25 cm   LV e' medial:    10.90 cm/s LV PW:         0.90 cm   LV E/e' medial:  7.8 LV IVS:        0.90 cm   LV e' lateral:   10.10 cm/s LVOT diam:     2.10 cm   LV E/e' lateral:  8.4 LV SV:         71 LV SV Index:   41 LVOT Area:     3.46 cm  RIGHT VENTRICLE             IVC RV Basal diam:  2.90 cm     IVC diam: 2.10 cm RV S prime:     14.20 cm/s TAPSE (M-mode): 2.4 cm LEFT ATRIUM             Index        RIGHT ATRIUM           Index LA diam:        2.90 cm 1.68 cm/m   RA Area:     13.20 cm LA Vol (A2C):   44.4 ml 25.72 ml/m  RA Volume:   33.00 ml  19.11 ml/m LA Vol (A4C):   48.2 ml 27.92 ml/m LA Biplane Vol: 46.1 ml 26.70 ml/m  AORTIC VALVE                 PULMONIC VALVE AV Area (Vmax): 3.25 cm     PV Vmax:       0.79 m/s AV Vmax:        120.50 cm/s  PV Peak grad:  2.5 mmHg AV Peak Grad:   5.8 mmHg LVOT Vmax:      113.00 cm/s LVOT Vmean:     73.900 cm/s LVOT VTI:       0.205 m  AORTA Ao Root diam: 3.90 cm Ao Asc diam:  3.90 cm MITRAL VALVE MV Area (PHT): 7.29 cm    SHUNTS MV Decel Time: 104 msec    Systemic VTI:  0.20 m MV E velocity: 84.90 cm/s  Systemic Diam: 2.10 cm MV A velocity: 85.40 cm/s MV E/A ratio:  0.99 Candee Furbish MD Electronically signed by Candee Furbish MD Signature Date/Time: 01/23/2022/1:13:41 PM    Final (Updated)    CT IMAGE GUIDED DRAINAGE  BY PERCUTANEOUS CATHETER  Result Date: 01/30/2022 CLINICAL DATA:  Right upper quadrant abdominal pain. Right perinephric fluid collection on CT, 1st demonstrated 01/21/2022, new from previous CT 01/15/2022. Intra-abdominal abscess suspected following ERCP. Stable size of large complex right perinephric fluid collection on MR, with heterogeneous peripheral enhancement following contrast, highly suspicious for an infected fluid collection. Differential considerations include infected urinoma and infected biloma given the patient's recent biliary intervention. EXAM: CT GUIDED DRAINAGE OF RETROPERITONEAL ABSCESS ANESTHESIA/SEDATION: Intravenous Fentanyl 18mg and Versed 0.'5mg'$  were administered as conscious sedation during continuous monitoring of the patient's level of consciousness and physiological / cardiorespiratory status by  the radiology RN, with a total moderate sedation time of 18 minutes. PROCEDURE: The procedure, risks, benefits, and alternatives were explained to the patient. Questions regarding the procedure were encouraged and answered. The patient understands and consents to the procedure. Patient placed prone. Select axial scans through the mid abdomen were obtained. The right retroperitoneal collection was localized and an appropriate skin site was determined and marked. Under CT fluoroscopic guidance, 18 gauge trocar needle advanced into the collection. Purulent material could be aspirated. Amplatz wire advanced easily, position confirmed on CT. Tract dilated to facilitate placement 12 French pigtail drain catheter, formed centrally within the collection. Position confirmed on CT. Approximately 20 mL of purulent material were aspirated and sent for Gram stain and culture. Catheter secured externally with 0 Prolene suture and StatLock and placed to external gravity drain bag The patient tolerated the procedure well. RADIATION DOSE REDUCTION: This exam was performed according to the departmental dose-optimization program which includes automated exposure control, adjustment of the mA and/or kV according to patient size and/or use of iterative reconstruction technique. COMPLICATIONS: None immediate FINDINGS: Multiloculated right retroperitoneal collection was localized. 12 French pigtail drain catheter placed into the inferior dominant component. 20 mL purulent aspirate sent for Gram stain and culture. IMPRESSION: Technically successful CT-guided retroperitoneal abscess drain catheter placement. Electronically Signed   By: DLucrezia EuropeM.D.   On: 01/30/2022 16:00   [place] SITE RITE  Result Date: 01/31/2022 If Site Rite image not attached, placement could not be confirmed due to current cardiac rhythm.  CT CHEST ABDOMEN PELVIS WO CONTRAST  Result Date: 01/16/2022 CLINICAL DATA:  Aortic aneurysm, known or suspected  abdominal pain, known aortic aneurysm, s/p ERCP earlier today. Abdominal pain, nausea. EXAM: CT CHEST, ABDOMEN AND PELVIS WITHOUT CONTRAST TECHNIQUE: Multidetector CT imaging of the chest, abdomen and pelvis was performed following the standard protocol without IV contrast. RADIATION DOSE REDUCTION: This exam was performed according to the departmental dose-optimization program which includes automated exposure control, adjustment of the mA and/or kV according to patient size and/or use of iterative reconstruction technique. COMPARISON:  None. FINDINGS: CT CHEST FINDINGS Cardiovascular: No significant vascular findings. Normal heart size. No pericardial effusion. Mediastinum/Nodes: No enlarged mediastinal, hilar, or axillary lymph nodes. Thyroid gland, trachea, and esophagus demonstrate no significant findings. Lungs/Pleura: Lungs are clear. No pleural effusion or pneumothorax. Musculoskeletal: No chest wall mass or suspicious bone lesions identified. CT ABDOMEN PELVIS FINDINGS Hepatobiliary: Status post cholecystectomy. Mild intra and extrahepatic pneumobilia in keeping with given history of recent sphincterotomy. Several punctate foci of gas seen adjacent to the surgical clips may represent gas within the residual cystic duct. There is interstitial fluid extending along the anti mesenteric border of the duodenum into the hepatic hilum, either postsurgical in nature or potentially inflammatory related to inflammation of the pancreatic head or second portion of the duodenum. Pancreas: Mild peripancreatic inflammatory stranding surrounds  the head and uncinate process of the pancreas, best seen on image # 61/2, which may reflect changes of acute pancreatitis or retroperitoneal fluid related to recent surgical procedure. The body and tail the pancreas are unremarkable. No loculated peripancreatic fluid collections. Spleen: Unremarkable Adrenals/Urinary Tract: Adrenal glands are unremarkable. Kidneys are normal, without  renal calculi, focal lesion, or hydronephrosis. Bladder is unremarkable. Stomach/Bowel: Moderate stool within the colon. No evidence of obstruction. Periduodenal fluid seen within the retroperitoneum adjacent to the second portion of the duodenum extending of the hepatic hilum, described above. Stomach, small bowel, and large bowel are otherwise unremarkable. Appendix normal. Vascular/Lymphatic: The abdominal vasculature is unremarkable on this noncontrast examination. No pathologic adenopathy. Reproductive: The pelvic organs are unremarkable. Other: No significant abdominal wall hernia. Musculoskeletal: 2.7 cm circumscribed lytic lesion demonstrating remodeling of the adjacent sacrum is in keeping with a perineural cyst. No acute bone abnormality. IMPRESSION: Interval development of mild intra and extrahepatic pneumobilia in keeping with given history of ERCP and sphincterotomy. Punctate foci of gas within the hepatic hilum may represent gas within the remnant of the cystic duct or, less likely, extraluminal gas. Infiltrative fluid seen adjacent to the uncinate process and head of the pancreas as well as along the anti mesenteric border of the second portion of the duodenum extending into the hepatic hilum. This may represent postprocedural fluid/change. Alternatively, this may reflect inflammatory fluid related to acute pancreatitis or enteric fluid related to duodenal injury/perforation with retroperitoneal leakage. Correlation with laboratory examination including serum lipase may be helpful in further evaluating this finding. Moderate stool burden. Electronically Signed   By: Fidela Salisbury M.D.   On: 01/16/2022 00:27   IR Radiologist Eval & Mgmt  Result Date: 02/08/2022 Please refer to notes tab for details about interventional procedure. (Op Note)  US THORACENTESIS ASP PLEURAL SPACE W/IMG GUIDE  Result Date: 01/23/2022 INDICATION: Pleural effusion, biliary obstruction, possible pancreatitis EXAM:  ULTRASOUND GUIDED RIGHT THORACENTESIS MEDICATIONS: None. COMPLICATIONS: None immediate. PROCEDURE: An ultrasound guided thoracentesis was thoroughly discussed with the patient and questions answered. The benefits, risks, alternatives and complications were also discussed. The patient understands and wishes to proceed with the procedure. Written consent was obtained. Ultrasound was performed to localize and mark an adequate pocket of fluid in the right chest. The area was then prepped and draped in the normal sterile fashion. 1% Lidocaine was used for local anesthesia. Under ultrasound guidance a 6 Fr Safe-T-Centesis catheter was introduced. Thoracentesis was performed. The catheter was removed and a dressing applied. FINDINGS: A total of approximately 300cc of yellow fluid was removed. Samples were sent to the laboratory as requested by the clinical team. IMPRESSION: Successful ultrasound guided right thoracentesis yielding 300cc of pleural fluid. Performed and dictated by Pasty Spillers, PA-C Electronically Signed   By: Aletta Edouard M.D.   On: 01/23/2022 15:21    Labs:  CBC: Recent Labs    01/28/22 0343 01/29/22 0401 01/30/22 0401 02/01/22 0327  WBC 19.9* 18.3* 13.6* 10.7*  HGB 10.5* 11.0* 11.2* 10.8*  HCT 33.0* 35.0* 34.3* 33.8*  PLT 463* 518* 535* 541*    COAGS: Recent Labs    01/29/22 1410  INR 1.1    BMP: Recent Labs    01/28/22 0343 01/29/22 0401 01/30/22 0401 02/01/22 0327  NA 140 137 140 138  K 3.4* 3.6 3.8 3.9  CL 106 104 105 105  CO2 '26 23 27 26  '$ GLUCOSE 102* 90 97 93  BUN '6 8 7 11  '$ CALCIUM 8.3* 8.2*  8.3* 8.4*  CREATININE 0.46 0.55 0.60 0.53  GFRNONAA >60 >60 >60 >60    LIVER FUNCTION TESTS: Recent Labs    01/27/22 1834 01/28/22 0343 01/29/22 0401 01/30/22 0401  BILITOT 0.5 0.5 0.5 0.5  AST 28 18 11* 11*  ALT '25 20 17 15  '$ ALKPHOS 71 63 67 67  PROT 6.8 5.8* 5.9* 6.0*  ALBUMIN 3.1* 2.7* 2.7* 2.6*    TUMOR MARKERS: No results for input(s): AFPTM,  CEA, CA199, CHROMGRNA in the last 8760 hours.  Assessment and Plan:  56 year old female with large complex right perinephric abscess cavity with a percutaneous drainage catheter in place.  She continues to have purulent thick output from the drain despite flushing twice per day.  Additionally, she is having low-grade fevers with a temperature of 99-100 degrees.  She is still on antibiotics.  CT imaging suggests improvement in the perinephric abscess, but the superior aspect of the collection has not yet drained well.  Additionally, she is having some increasing pain radiating up along her right back toward the shoulder and associated with pleuritic chest pain.  I am concerned that the superior portion of the abscess collection is becoming more symptomatic.  I think we may be able to reposition the drainage catheter more superiorly in order to facilitate drainage of this component.  1.)  Schedule at either [facility] or [facility] on Monday for drain injection and attempted repositioning into the more superior aspects of the perinephric fluid collection.  Drain upsize may also be considered.  This procedure should be performed with moderate sedation.  If unable to reposition the drainage catheter, she may require Denovo placement of a second drain.    Electronically Signed: Criselda Peaches 02/08/2022, 2:57 PM   I spent a total of  15 Minutes in face to face in clinical consultation, greater than 50% of which was counseling/coordinating care for right perinephric abscess with drain in place.

## 2022-02-09 ENCOUNTER — Other Ambulatory Visit: Payer: Self-pay | Admitting: Physician Assistant

## 2022-02-09 DIAGNOSIS — B999 Unspecified infectious disease: Secondary | ICD-10-CM

## 2022-02-11 ENCOUNTER — Ambulatory Visit (HOSPITAL_COMMUNITY)
Admission: RE | Admit: 2022-02-11 | Discharge: 2022-02-11 | Disposition: A | Discharge status: Home / Self Care | Payer: 59 | Source: Ambulatory Visit | Attending: Interventional Radiology | Admitting: Interventional Radiology

## 2022-02-11 ENCOUNTER — Encounter: Payer: Self-pay | Admitting: Infectious Diseases

## 2022-02-11 ENCOUNTER — Other Ambulatory Visit: Payer: Self-pay

## 2022-02-11 ENCOUNTER — Encounter (HOSPITAL_COMMUNITY): Payer: Self-pay

## 2022-02-11 DIAGNOSIS — Z4803 Encounter for change or removal of drains: Secondary | ICD-10-CM | POA: Insufficient documentation

## 2022-02-11 DIAGNOSIS — B999 Unspecified infectious disease: Secondary | ICD-10-CM

## 2022-02-11 DIAGNOSIS — K831 Obstruction of bile duct: Secondary | ICD-10-CM | POA: Diagnosis not present

## 2022-02-11 DIAGNOSIS — N151 Renal and perinephric abscess: Secondary | ICD-10-CM | POA: Insufficient documentation

## 2022-02-11 HISTORY — PX: IR EXCHANGE BILIARY DRAIN: IMG6046

## 2022-02-11 MED ORDER — FENTANYL CITRATE (PF) 100 MCG/2ML IJ SOLN
INTRAMUSCULAR | Status: AC
  Filled 2022-02-11: qty 2

## 2022-02-11 MED ORDER — SODIUM CHLORIDE 0.9% FLUSH: 5.0000 mL | Freq: Three times a day (TID) | INTRAVENOUS | Status: DC

## 2022-02-11 MED ORDER — FENTANYL CITRATE (PF) 100 MCG/2ML IJ SOLN
INTRAMUSCULAR | Status: AC | PRN
  Administered 2022-02-11: 25 ug via INTRAVENOUS

## 2022-02-11 MED ORDER — CHLORHEXIDINE GLUCONATE 4 % EX LIQD
CUTANEOUS | Status: AC
  Filled 2022-02-11: qty 15

## 2022-02-11 MED ORDER — MIDAZOLAM HCL 2 MG/2ML IJ SOLN
INTRAMUSCULAR | Status: AC
  Filled 2022-02-11: qty 2

## 2022-02-11 MED ORDER — SODIUM CHLORIDE 0.9 % IV SOLN
INTRAVENOUS | Status: DC
Start: 2022-02-11 — End: 2022-02-12

## 2022-02-11 MED ORDER — IOHEXOL 300 MG/ML  SOLN
100.0000 mL | Freq: Once | INTRAMUSCULAR | Status: AC | PRN
  Administered 2022-02-11: 15 mL

## 2022-02-11 MED ORDER — MIDAZOLAM HCL 2 MG/2ML IJ SOLN
INTRAMUSCULAR | Status: AC | PRN
  Administered 2022-02-11: .5 mg via INTRAVENOUS

## 2022-02-11 MED ORDER — LIDOCAINE HCL 1 % IJ SOLN
INTRAMUSCULAR | Status: AC
  Administered 2022-02-11: 10 mL
  Filled 2022-02-11: qty 20

## 2022-02-11 NOTE — Sedation Documentation (Signed)
Pt D/C home in wheelchair with family. Dressing to drain dry and intact. Awake and alert. In no distress

## 2022-02-11 NOTE — Procedures (Signed)
Interventional Radiology Procedure Note  Procedure: Reposition and exchange of right RP drain  Indication: Right flank pain and fever  Findings: Please refer to procedural dictation for full description.  Complications: None  EBL: < 10 mL  Miachel Roux, MD [phone]

## 2022-02-11 NOTE — Progress Notes (Addendum)
Patient called IR with concerns that there is no drain output since the drain was exchanged today. She did state a little bit of blood came out of the drain but now there's nothing. Patient has not been flushing the drain but has flushes at home. Patient was encouraged to give the drain a gentle flush to see if perhaps the catheter is clogged with blood. She states someone will be home in approximately 30 min to help her with this. She will call IR back if flushing the drain does not fix the problem. She has the number to the IR APP office at [facility].   ** Addendum ** Patient called back and stated that even after flushing the drain there was no output. Case discussed with Dr. Dwaine Gale. Dr. Dwaine Gale states he aspirated some fluid during drain placement and it's possible there is nothing to drain at the moment. Patient was advised to called IR if she develops any pain and/or drainage around the catheter entry site. Patient will otherwise follow up with IR in one week.   Soyla Dryer, [place] [phone] 02/11/2022, 3:48 PM

## 2022-02-11 NOTE — H&P (Signed)
Chief Complaint: Patient was seen in consultation today for Rt perinephric abscess drain manipulation/reposition at the request of Criselda Peaches   Supervising Physician: Mir, Sharen Heck  Patient Status: [facility] - Out-pt  History of Present Illness: Laurie Terry is a 56 y.o. female   Perinephric abscess drain placed in IR 01/30/22 Follows with IR and Dr Wyline Copas  IR visit 5/19:  Output remains thick and purulent and approximately 20-25 mL daily.  She is flushing the drainage catheter once or twice daily.    She has persistent low-grade temperatures of 99-100 degrees.   Additionally, she is starting to develop increasing pain  That she feels is on the inside radiating from about where the drain is placed upward toward her scapula.  Additionally, she has pain with deep inspiration.    CT same day: IMPRESSION: 1. Well-positioned right inferior perinephric drainage catheter with improvement in the amount of irregular perinephric fluid adjacent to the catheter. However, complex perinephric fluid persists more superiorly extending up into the apex of the right perinephric space just below the diaphragm. 2. Persistent and unchanged small right pleural effusion and associated right lower lobe atelectasis. 3. Progressive expansile lesion in the S2 neural foramen may represent a Tarlov cyst. 4. Additional ancillary findings as above.   Scheduled now for Rt perinephric abscess drain repositioning; possible aspiration; possible new drain placement   Past Medical History:  Diagnosis Date   Allergic rhinitis    Aortic aneurysm ([place]) 09/23/2020   Cancer ([place])    Melanoma   Cervical dysplasia 2007   low-grade SIL, normal Pap smears since then   Family history of adverse reaction to anesthesia    mom has h/o post op nausea/vomiting   Intermittent palpitations    IUD    inserted 03/2002.04/06/2007, 06/03/2012   Ocular migraine    Osteomalacia    Pancreatitis    Seafood allergy      Past Surgical History:  Procedure Laterality Date   BIOPSY  01/21/2022   Procedure: BIOPSY;  Surgeon: Wilford Corner, MD;  Location: [facility];  Service: Gastroenterology;;   Lillard Anes  2018   CHOLECYSTECTOMY N/A 04/09/2016   Procedure: LAPAROSCOPIC CHOLECYSTECTOMY ;  Surgeon: Rolm Bookbinder, MD;  Location: [place];  Service: General;  Laterality: N/A;   ERCP  04/11/2016   ERCP with pancreatic stent and failed CBD stent after needle knife and conventional sphincterotomy   ERCP N/A 04/12/2016   Procedure: ENDOSCOPIC RETROGRADE CHOLANGIOPANCREATOGRAPHY (ERCP);  Surgeon: Clarene Essex, MD;  Location: [facility] END[facility]COPY;  Service: Endoscopy;  Laterality: N/A;   ERCP N/A 01/15/2022   Procedure: ENDOSCOPIC RETROGRADE CHOLANGIOPANCREATOGRAPHY (ERCP);  Surgeon: Clarene Essex, MD;  Location: [facility];  Service: Gastroenterology;  Laterality: N/A;   FLEXIBLE SIGMOIDOSCOPY N/A 01/21/2022   Procedure: FLEXIBLE SIGMOIDOSCOPY;  Surgeon: Wilford Corner, MD;  Location: [facility];  Service: Gastroenterology;  Laterality: N/A;   FOOT SURGERY     toenail   INTRAUTERINE DEVICE INSERTION  08/07/2017   Mirena   IR RADIOLOGIST EVAL & MGMT  02/08/2022   removal of melanoma     REMOVAL OF STONES  01/15/2022   Procedure: REMOVAL OF STONES;  Surgeon: Clarene Essex, MD;  Location: [facility];  Service: Gastroenterology;;   [name]  01/15/2022   Procedure: [name];  Surgeon: Clarene Essex, MD;  Location: [facility];  Service: Gastroenterology;;    Allergies: Cefepime, Contrast media [iodinated contrast media], Iodine, Other, Ampicillin, Avocado, Levofloxacin, Pantoprazole, Septra [bactrim], Sulfamethoxazole-trimethoprim, Vancomycin, and Colestipol hcl  Medications: Prior to Admission medications  Medication Sig Start Date End Date Taking? Authorizing Provider  acetaminophen (TYLENOL) 500 MG tablet Take 1,000 mg by mouth every 6 (six) hours as needed for mild pain or fever.   Yes [provider]  amoxicillin-clavulanate (AUGMENTIN) 875-125 MG tablet Take 1 tablet by mouth every 12 (twelve) hours for 14 days. 01/31/22 02/14/22 Yes Mignon Pine, DO  cholestyramine light (PREVALITE) 4 g packet Take 1 packet (4 g total) by mouth 2 (two) times daily. Patient taking differently: Take 4 g by mouth as needed. 01/26/22  Yes Regalado, Belkys A, MD  dextromethorphan (DELSYM) 30 MG/5ML liquid Take 5 mLs (30 mg total) by mouth 2 (two) times daily. Patient taking differently: Take 30 mg by mouth as needed for cough. 02/02/22  Yes Debbe Odea, MD  diclofenac Sodium (VOLTAREN) 1 % GEL Apply 4 g topically 4 (four) times daily. 02/02/22  Yes Debbe Odea, MD  dicyclomine (BENTYL) 10 MG capsule Take 1 capsule (10 mg total) by mouth 3 (three) times daily as needed for spasms. 02/02/22 03/04/22 Yes Debbe Odea, MD  diphenhydrAMINE (BENADRYL) 50 MG capsule Take one capsule 1 hour prior to scan. 02/08/22  Yes Docia Barrier, PA  famotidine (PEPCID) 20 MG tablet Take 20 mg by mouth daily as needed for heartburn or indigestion.   Yes [provider]  fluconazole (DIFLUCAN) 200 MG tablet Take 2 tablets (400 mg total) by mouth daily for 11 days. 02/03/22 02/14/22 Yes Debbe Odea, MD  fluticasone (FLONASE) 50 MCG/ACT nasal spray Place 2 sprays into both nostrils 2 (two) times daily as needed for allergies. 07/14/20  Yes [provider]  metoprolol tartrate (LOPRESSOR) 25 MG tablet Take 1 tablet (25 mg total) by mouth daily as needed (svt). 01/26/22  Yes Regalado, Belkys A, MD  predniSONE (DELTASONE) 50 MG tablet Take one tablet 13 hours, 7 hours, and 1 hour prior to scan. 02/08/22  Yes Docia Barrier, PA  traMADol (ULTRAM) 50 MG tablet Take 1 tablet (50 mg total) by mouth every 6 (six) hours as needed for moderate pain. 02/02/22  Yes Debbe Odea, MD  albuterol (PROAIR HFA) 108 (90 Base) MCG/ACT inhaler Inhale 2 puffs into the lungs every 4 (four) hours as needed for  wheezing or shortness of breath (coughing fits). 05/23/21   Garnet Sierras, DO  chlorpheniramine-HYDROcodone 10-8 MG/5ML Take 5 mLs by mouth at bedtime for 14 doses. 02/02/22 02/16/22  Debbe Odea, MD  eletriptan (RELPAX) 40 MG tablet 40 mg every 2 (two) hours as needed for migraine. 05/18/20   [provider]  EPINEPHrine 0.3 mg/0.3 mL IJ SOAJ injection Inject 0.3 mg into the muscle as needed for anaphylaxis.    [provider]  ketotifen (ZADITOR) 0.025 % ophthalmic solution Place 1 drop into both eyes daily as needed (Itching eye and allergies).    [provider]  Sodium Chloride Flush (NORMAL SALINE FLUSH) 0.9 % SOLN Flush right drain with 5 ml of sterile saline once daily 02/01/22        Family History  Problem Relation Age of Onset   Tremor Mother    Stroke Father    Autoimmune disease Father    CVA Father    Breast cancer Sister 54   Arthritis Sister        Psoriatic   Thyroid disease Sister    Breast cancer Maternal Grandmother        Age 8   Thyroid disease Daughter        Hypo  Social History   Socioeconomic History   Marital status: Married    Spouse name: Not on file   Number of children: Not on file   Years of education: Not on file   Highest education level: Not on file  Occupational History   Not on file  Tobacco Use   Smoking status: Former   Smokeless tobacco: Never  Vaping Use   Vaping Use: Never used  Substance and Sexual Activity   Alcohol use: Not Currently    Comment: Rare   Drug use: No   Sexual activity: Yes    Birth control/protection: Post-menopausal    Comment:  declined sexual hx questions,des neg  Other Topics Concern   Not on file  Social History Narrative   Not on file   Social Determinants of Health   Financial Resource Strain: Not on file  Food Insecurity: Not on file  Transportation Needs: Not on file  Physical Activity: Not on file  Stress: Not on file  Social Connections: Not on file    Review of  Systems: A 12 point ROS discussed and pertinent positives are indicated in the HPI above.  All other systems are negative.  Review of Systems  Constitutional:  Positive for fever. Negative for activity change and fatigue.  Respiratory:  Negative for cough and shortness of breath.   Cardiovascular:  Negative for chest pain.  Gastrointestinal:  Negative for abdominal pain.  Genitourinary:  Positive for flank pain.  Psychiatric/Behavioral:  Negative for behavioral problems and confusion.    Vital Signs: BP 104/72   Pulse 99   Temp 97.8 F (36.6 C) (Oral)   Resp 17   Ht '5\' 5"'$  (1.651 m)   Wt 135 lb (61.2 kg)   SpO2 96%   BMI 22.47 kg/m   Physical Exam Vitals reviewed.  HENT:     Mouth/Throat:     Mouth: Mucous membranes are moist.  Cardiovascular:     Rate and Rhythm: Normal rate and regular rhythm.     Heart sounds: Normal heart sounds.  Pulmonary:     Effort: Pulmonary effort is normal.     Breath sounds: Normal breath sounds.  Abdominal:     Palpations: Abdomen is soft.  Musculoskeletal:        General: Normal range of motion.  Skin:    General: Skin is warm.  Neurological:     Mental Status: She is alert and oriented to person, place, and time.  Psychiatric:        Behavior: Behavior normal.    Imaging: CT ABDOMEN PELVIS WO CONTRAST  Result Date: 02/08/2022 CLINICAL DATA:  56 year old female with a complicated right perinephric abscess secondary to presumed microperforation sustained during prior ERCP for choledocholithiasis. EXAM: CT ABDOMEN AND PELVIS WITHOUT CONTRAST TECHNIQUE: Multidetector CT imaging of the abdomen and pelvis was performed following the standard protocol without IV contrast. RADIATION DOSE REDUCTION: This exam was performed according to the departmental dose-optimization program which includes automated exposure control, adjustment of the mA and/or kV according to patient size and/or use of iterative reconstruction technique. COMPARISON:  MRI  abdomen 01/28/2022; CT abdomen/pelvis 01/27/2022 FINDINGS: Lower chest: Small right pleural effusion, similar compared to prior. Associated right lower lobe atelectasis. Unremarkable appearance of the heart and distal esophagus. Hepatobiliary: Surgical changes of prior cholecystectomy. Pneumobilia is present consistent with patency of the biliary sphincter. Normal hepatic contour and morphology. No discrete hepatic lesion. No biliary ductal dilatation. Pancreas: Unremarkable. No pancreatic ductal dilatation or surrounding inflammatory changes. Spleen:  Normal in size without focal abnormality. Adrenals/Urinary Tract: Unremarkable adrenal glands. The left kidney is normal in appearance. Irregular fluid collection remains present in the right superior perinephric space extending inferiorly along the lateral margin of the perinephric space to a percutaneous drainage catheter. The drainage catheter is well position. The inferior aspect of the fluid collection is significantly reduced in volume. However, the more superior aspect remains nearly unchanged. No new fluid collection identified. Ureters and bladder are unremarkable. Stomach/Bowel: Stomach is within normal limits. Appendix appears normal. No evidence of bowel wall thickening, distention, or inflammatory changes. Vascular/Lymphatic: Limited evaluation in the absence of intravenous contrast. No evidence of atherosclerotic plaque or suspicious adenopathy. Reproductive: Status post hysterectomy. No adnexal masses. Other: No abdominal wall hernia or abnormality. No abdominopelvic ascites. Musculoskeletal: No acute or significant osseous findings. Expansile low-attenuation lesion present within the left sacral foramen has mildly enlarged compared to prior imaging from 2017. IMPRESSION: 1. Well-positioned right inferior perinephric drainage catheter with improvement in the amount of irregular perinephric fluid adjacent to the catheter. However, complex perinephric  fluid persists more superiorly extending up into the apex of the right perinephric space just below the diaphragm. 2. Persistent and unchanged small right pleural effusion and associated right lower lobe atelectasis. 3. Progressive expansile lesion in the S2 neural foramen may represent a Tarlov cyst. 4. Additional ancillary findings as above. Electronically Signed   By: Jacqulynn Cadet M.D.   On: 02/08/2022 15:35   CT Abdomen Pelvis Wo Contrast  Result Date: 01/27/2022 CLINICAL DATA:  Abdominal pain. Right-sided retroperitoneal inflammatory process after ERCP with persistent fever. Status post recent right thoracentesis. EXAM: CT ABDOMEN AND PELVIS WITHOUT CONTRAST TECHNIQUE: Multidetector CT imaging of the abdomen and pelvis was performed following the standard protocol without IV contrast. RADIATION DOSE REDUCTION: This exam was performed according to the departmental dose-optimization program which includes automated exposure control, adjustment of the mA and/or kV according to patient size and/or use of iterative reconstruction technique. COMPARISON:  CT abdomen pelvis dated 01/24/2022. FINDINGS: Evaluation of this exam is limited in the absence of intravenous contrast. Lower chest: Small bilateral pleural effusions similar to prior CT. There is associated partial compressive atelectasis of the lower lobes. No intra-abdominal free air. Hepatobiliary: The liver is unremarkable. Cholecystectomy. Similar pneumobilia. Slight interval decrease in the subhepatic fluid collection compared to prior CT. This collection extends down along the right paracolic gutter similar to prior CT. Pancreas: Unremarkable. No pancreatic ductal dilatation or surrounding inflammatory changes. Spleen: Normal in size without focal abnormality. Adrenals/Urinary Tract: The left adrenal glands unremarkable. The right adrenal gland is not visualized. The left kidney is unremarkable. Punctate nonobstructing stone in the upper pole of the  right kidney. No hydronephrosis. Similar appearance of right perinephric fluid collection as the prior CT. This may be related to prior hematoma. The urinary bladder is unremarkable. Stomach/Bowel: No bowel obstruction or active inflammation. The appendix is normal. Vascular/Lymphatic: IVC are unremarkable on this noncontrast CT. No portal venous gas. There is no adenopathy. Reproductive: The uterus and ovaries are grossly unremarkable. No adnexal masses. Other: None Musculoskeletal: Mild degenerative changes of the spine. No acute osseous pathology. IMPRESSION: 1. No significant interval change in the right subhepatic and perinephric fluid collection compared to prior CT. 2. Small bilateral pleural effusions similar to prior CT.  A 3. Punctate nonobstructing right renal upper pole stone. No hydronephrosis. 4. No bowel obstruction. Normal appendix. Electronically Signed   By: Anner Crete M.D.   On: 01/27/2022 22:42  CT ABDOMEN PELVIS WO CONTRAST  Result Date: 01/24/2022 CLINICAL DATA:  Right-sided retroperitoneal inflammatory process after ERCP with persistent fever. Status post right thoracentesis yesterday. Evaluation of inflammatory process for drainable abscess and repeat CT with oral contrast to evaluate for duodenal leak. The patient was pre-medicated for administration of IV contrast due to prior history significant allergic reaction but decision was made to not administer IV contrast today due to some persistent symptoms related to allergic reaction to IV antibiotic administration the previous night. EXAM: CT ABDOMEN AND PELVIS WITHOUT CONTRAST TECHNIQUE: Multidetector CT imaging of the abdomen and pelvis was performed following the standard protocol without IV contrast. RADIATION DOSE REDUCTION: This exam was performed according to the departmental dose-optimization program which includes automated exposure control, adjustment of the mA and/or kV according to patient size and/or use of iterative  reconstruction technique. COMPARISON:  01/21/2022 FINDINGS: Lower chest: Significant reduction in right pleural fluid volume at the lung base after thoracentesis yesterday with only a trace amount of fluid remaining. There is a smaller trace left pleural effusion. Hepatobiliary: Stable pneumobilia in the left lobe of the liver after prior ERCP. Clips from prior cholecystectomy. Small amount of air bubbles in the gallbladder fossa adjacent to cholecystectomy clips and extending towards the duodenum appear unchanged and there is no significant fluid collection associated with the gallbladder fossa. Pancreas: Unremarkable. No pancreatic ductal dilatation or surrounding inflammatory changes. Spleen: Normal in size without focal abnormality. Adrenals/Urinary Tract: Adrenal glands are unremarkable. Kidneys are normal, without renal calculi, focal lesion, or hydronephrosis. Bladder is unremarkable. Stomach/Bowel: At the time of imaging, most of the ingested oral contrast is within the small bowel and some diluted contrast in the colon. There also is a small amount of residual diluted contrast in the stomach. There is no evidence to suggest overt leakage of oral contrast from the GI tract and particularly the duodenum. No evidence of bowel obstruction or significant ileus. Vascular/Lymphatic: No significant vascular findings are present. No enlarged abdominal or pelvic lymph nodes. Reproductive: Uterus and bilateral adnexa are unremarkable. Other: Inflammatory process centered in the posterior right retroperitoneal space abutting the medial margin of the lower liver, superior aspect of the right kidney and the posterior duodenum appears stable since the prior study with no significant progression or development discrete marginated fluid collection. Dependent free fluid in the pelvis appears slightly smaller in volume compared to the prior study. No free intraperitoneal air. Musculoskeletal: No acute or significant osseous  findings. IMPRESSION: 1. Reduction in right pleural fluid volume after thoracentesis yesterday. Trace bilateral pleural fluid remains. 2. Stable small amount of air bubbles in the gallbladder fossa adjacent to cholecystectomy tips and extending towards the duodenum. No significant fluid collection associated with the gallbladder fossa. 3. Stable inflammatory process of the right retroperitoneal space abutting the lower liver, superior right kidney and posterior duodenum. This shows no significant progression since the 01/21/2022 scan and no discrete enlarging marginated fluid collection is identified. It is difficult to evaluate for discrete abscess without IV contrast. 4. No evidence of oral contrast leak to suggest overt bowel leak, specifically at the level of the duodenum. 5. Decreased volume of free fluid in the pelvis. Electronically Signed   By: Aletta Edouard M.D.   On: 01/24/2022 12:46   CT ABDOMEN PELVIS WO CONTRAST  Result Date: 01/21/2022 CLINICAL DATA:  Abdominal pain, nausea.  Recent lipase is normal. EXAM: CT ABDOMEN AND PELVIS WITHOUT CONTRAST TECHNIQUE: Multidetector CT imaging of the abdomen and pelvis was performed following  the standard protocol without IV contrast. RADIATION DOSE REDUCTION: This exam was performed according to the departmental dose-optimization program which includes automated exposure control, adjustment of the mA and/or kV according to patient size and/or use of iterative reconstruction technique. COMPARISON:  01/15/2022 FINDINGS: Lower chest: New moderate right and smaller left pleural effusions, with adjacent atelectasis/consolidation in the lower lobes. Hepatobiliary: Cholecystectomy clips. Gas in the biliary tree which is nondilated. Poorly marginated loculated gas and fluid noted in the gallbladder fossa, increased since prior CT. No new liver lesion. Pancreas: Unremarkable. No pancreatic ductal dilatation or surrounding inflammatory changes. Spleen: Normal in size  without focal abnormality. Adrenals/Urinary Tract: No adrenal mass. Left kidney remains unremarkable. 1 mm calculus in the upper pole right renal collecting system without hydronephrosis or ureterectasis. Urinary bladder physiologically distended. Stomach/Bowel: Stomach decompressed. Small bowel is nondistended. Appendix not identified. The colon is nondilated, unremarkable. Vascular/Lymphatic: No significant vascular findings are present. No enlarged abdominal or pelvic lymph nodes. Reproductive: Uterus and bilateral adnexa are unremarkable. Other: Bilateral pelvic phleboliths. Small volume pelvic ascites. No free air. Since the prior scan there has been significant development of right retroperitoneal fluid inferior to the liver and lateral to the right kidney. Musculoskeletal: Mild spondylitic changes in the lower lumbar spine. IMPRESSION: 1. Increasing loculated gas and fluid in the gallbladder fossa. 2. New right retroperitoneal fluid inferior to the liver and lateral to the kidney. 3. New moderate  right and small left pleural effusions. 4. Small volume pelvic ascites, new since prior study. 5. Nonobstructive right urolithiasis. Electronically Signed   By: Lucrezia Europe M.D.   On: 01/21/2022 17:40   DG Chest 2 View  Result Date: 02/07/2022 CLINICAL DATA:  Right pleural effusion. EXAM: CHEST - 2 VIEW COMPARISON:  Jan 27, 2022. FINDINGS: The heart size and mediastinal contours are within normal limits. Left lung is clear. Mild right basilar subsegmental atelectasis is noted with small right pleural effusion. The visualized skeletal structures are unremarkable. IMPRESSION: Mild right basilar subsegmental atelectasis is noted with small right pleural effusion. Electronically Signed   By: Marijo Conception M.D.   On: 02/07/2022 12:44   DG Ribs Unilateral W/Chest Right  Result Date: 01/27/2022 CLINICAL DATA:  History of recent discharge from treatment of perforation from ERCP, presenting with fever and lower right  rib tenderness. EXAM: RIGHT RIBS AND CHEST - 3+ VIEW COMPARISON:  Jan 24, 2022 FINDINGS: A radiopaque marker was placed at the site of the patient's pain. No fracture or other bone lesions are seen involving the ribs. There is no evidence of pneumothorax or pleural effusion. There is mild elevation of the right hemidiaphragm. Small, predominantly stable bilateral pleural effusions are seen. There is no evidence of an acute infiltrate or pneumothorax. Heart size and mediastinal contours are within normal limits. Radiopaque surgical clips are seen within the right upper quadrant. IMPRESSION: 1. No acute osseous abnormality. 2. Small, predominantly stable bilateral pleural effusions. Electronically Signed   By: Virgina Norfolk M.D.   On: 01/27/2022 17:51   DG Abd 1 View  Result Date: 01/16/2022 CLINICAL DATA:  Epigastric pain EXAM: ABDOMEN - 1 VIEW COMPARISON:  Abdominal radiograph dated May 03, 2016 FINDINGS: Visualized bowel gas pattern is normal. Inferior pelvis is not included in the field of view. Cholecystectomy clips. No radio-opaque calculi or other significant radiographic abnormality are seen. IMPRESSION: Nonobstructive bowel gas pattern. Electronically Signed   By: Yetta Glassman M.D.   On: 01/16/2022 13:41   MR 3D Recon At Scanner  Result Date: 02/04/2022 CLINICAL DATA:  Right upper quadrant abdominal pain. Right perinephric fluid collection on CT first demonstrated 01/21/2022, new from previous CT. Intra-abdominal abscess suspected following ERCP. EXAM: 3-DIMENSIONAL MR IMAGE RENDERING ON ACQUISITION WORKSTATION TECHNIQUE: 3-dimensional MR images were rendered by post-processing of the original MR data on an acquisition workstation. The 3-dimensional MR images were interpreted and findings were reported in the accompanying complete MR report for this study COMPARISON:  None Available. FINDINGS: See results for MRI abdomen with and without contrast. IMPRESSION: See separate results for MRI of  the abdomen with and without contrast. Electronically Signed   By: Richardean Sale M.D.   On: 02/04/2022 09:02   DG CHEST PORT 1 VIEW  Result Date: 01/24/2022 CLINICAL DATA:  Cough EXAM: PORTABLE CHEST 1 VIEW COMPARISON:  01/23/2022 FINDINGS: Cardiac size is within normal limits. There are no signs of pulmonary edema or focal pulmonary consolidation. Small bilateral pleural effusions are seen. Right hemidiaphragm is elevated. There is no pneumothorax. No significant interval changes are noted. IMPRESSION: Small bilateral pleural effusions. There are no new infiltrates or signs of pulmonary edema. Electronically Signed   By: Elmer Picker M.D.   On: 01/24/2022 08:57   DG CHEST PORT 1 VIEW  Result Date: 01/23/2022 CLINICAL DATA:  Status post thoracentesis EXAM: PORTABLE CHEST 1 VIEW COMPARISON:  Previous studies including the examination of 01/20/2022 FINDINGS: There is interval decrease in bilateral pleural effusions. Blunting of both lateral CP angles suggests small bilateral pleural effusions. There is no pneumothorax. There is improvement in aeration of left lower lung fields. Small linear densities seen in both lower lung fields. There are no signs of pulmonary edema. IMPRESSION: There is interval decrease in bilateral pleural effusions. There is no pneumothorax. Small linear densities in the lower lung fields suggest subsegmental atelectasis. There are no signs of pulmonary edema or new focal consolidation. Electronically Signed   By: Elmer Picker M.D.   On: 01/23/2022 14:33   DG CHEST PORT 1 VIEW  Result Date: 01/20/2022 CLINICAL DATA:  Difficulty breathing EXAM: PORTABLE CHEST 1 VIEW COMPARISON:  CT from 01/16/2022 FINDINGS: Cardiac shadow is stable. Bilateral pleural effusions are now seen new from the prior exam right greater than left. Underlying atelectatic changes are likely present as well. No acute bony abnormality is noted. IMPRESSION: New increasing effusions bilaterally right  greater than left with underlying atelectatic changes. Electronically Signed   By: Inez Catalina M.D.   On: 01/20/2022 23:38   DG ERCP  Result Date: 01/15/2022 CLINICAL DATA:  56 year old female undergoing ERCP, history of abdominal pain. EXAM: ERCP TECHNIQUE: Multiple spot images obtained with the fluoroscopic device and submitted for interpretation post-procedure. FLUOROSCOPY TIME:  Fluoroscopy Time:  42 seconds Radiation Exposure Index (if provided by the fluoroscopic device): 35.86 mGy Number of Acquired Spot Images: 14 COMPARISON:  MRCP from 12/16/2021 FINDINGS: Duodenal scope is positioned over the second portion the duodenum. There is retrograde cannulation of the common bile duct and intrahepatic biliary tree. Retrograde cholangiogram demonstrates mild extrahepatic biliary ductal dilation. Evaluation of the mid and distal common bile duct is limited by overlying duodenal scope. Cholecystectomy clips are present. IMPRESSION: Retrograde cholangiogram demonstrating mild extrahepatic biliary ductal dilation with limited visualization of the mid to distal common bile duct. These images were submitted for radiologic interpretation only. Please see the procedural report for the full procedural details, amount of contrast, and the fluoroscopy time utilized. Electronically Signed   By: Ruthann Cancer M.D.   On: 01/15/2022 14:07  DG UGI W SINGLE CM (SOL OR THIN BA)  Result Date: 01/17/2022 CLINICAL DATA:  Post ERCP with stone removal. Epigastric pain. Concern for duodenal leak. EXAM: UPPER GI SERIES WITH KUB TECHNIQUE: After obtaining a scout radiograph a routine upper GI series was performed using water-soluble contrast FLUOROSCOPY: Radiation Exposure Index (as provided by the fluoroscopic device): 43.3 COMPARISON:  CT 01/16/2022, ERCP 01/15/2022 FINDINGS: Oral contrast flows freely through the esophagus into the stomach. Normal orientation of the stomach. Contrast flows into the first and second portion of  the duodenum without evidence of leak. Contrast subsequently flows into the third and fourth portion the duodenum and proximal small bowel. No extraluminal contrast to suggest duodenal leak or injury. Contrast does reflux into the common bile duct and common hepatic duct. Contrast reflux into the cystic duct. Small gas bubbles noted in the common bile duct. No stones identified. Patient was pre-medicated for IV contrast allergy. No adverse reactions. IMPRESSION: 1. No evidence of duodenal leak or perforation. 2. Contrast refluxes into the common bile duct, common hepatic duct, and cystic duct. 3. Small gas bubbles in the common bile duct. Electronically Signed   By: Suzy Bouchard M.D.   On: 01/17/2022 09:12   MR ABDOMEN MRCP W WO CONTAST  Result Date: 01/28/2022 CLINICAL DATA:  Right upper quadrant abdominal pain. Right perinephric fluid collection on CT, 1st demonstrated 01/21/2022, new from previous CT 01/15/2022. Intra-abdominal abscess suspected following ERCP. EXAM: MRI ABDOMEN WITHOUT AND WITH CONTRAST (INCLUDING MRCP) TECHNIQUE: Multiplanar multisequence MR imaging of the abdomen was performed both before and after the administration of intravenous contrast. Heavily T2-weighted images of the biliary and pancreatic ducts were obtained, and three-dimensional MRCP images were rendered by post processing. CONTRAST:  60m GADAVIST GADOBUTROL 1 MMOL/ML IV SOLN COMPARISON:  Noncontrast abdominopelvic CT 01/27/2022, 01/24/2022, 01/21/2022 and 01/16/2022. Abdominal MRI 12/16/2021 head FINDINGS: Lower chest: Small right-greater-than-left pleural effusions, mildly improved from previous CTs and similar to yesterday's examination. Mild right lower lobe atelectasis. Hepatobiliary: The liver is normal in signal, without steatosis, abnormal enhancement or focal lesion. Status post cholecystectomy. No significant biliary dilatation. In correlation with recent CT, mild pneumobilia, without evidence of  choledocholithiasis. Pancreas: Unremarkable. No pancreatic ductal dilatation or surrounding inflammatory changes. Spleen: Normal in size without focal abnormality. Adrenals/Urinary Tract: Both adrenal glands appear normal. Again demonstrated is a large complex multiloculated fluid collection in the subhepatic space which appears to involve the right perinephric space as well. This collection demonstrates no intrinsic T1 shortening to suggest hemorrhage. Following contrast, there is diffuse peripheral enhancement of this collection, suspicious for an infected fluid collection. Because of the shape of this collection, it is difficult to accurately measure, although there is a component superior to the right kidney which measures up to 5.2 x 3.1 cm on image 32/21. Inferior to the right kidney, there is a component measuring up to 4.4 x 5.7 cm on image 66/21. This collection is not significantly changed from the recent CT, and is new from 01/15/2022. No perinephric fluid collections are seen on the left. No evidence of hydronephrosis. Probable small incidental cyst in the interpolar region of the right kidney. Bladder not imaged. Stomach/Bowel: The stomach appears unremarkable for its degree of distension. No evidence of bowel wall thickening, distention or surrounding inflammatory change. Vascular/Lymphatic: There are no enlarged abdominal lymph nodes. No evidence of acute vascular thrombosis or aneurysm. Other: No other intra-abdominal fluid collections are identified. No ascites. Musculoskeletal: No acute or significant osseous findings. IMPRESSION: 1. Stable size  of large complex right perinephric fluid collection with heterogeneous peripheral enhancement following contrast, highly suspicious for an infected fluid collection. Differential considerations include infected urinoma and infected biloma given the patient's recent biliary intervention. As noted above, this collection is new from 01/15/2022 and not  significantly changed in size from the most recent noncontrast abdominal CTs. 2. No hydronephrosis or significant biliary dilatation. 3. Small right-greater-than-left pleural effusions with associated bibasilar atelectasis. Electronically Signed   By: Richardean Sale M.D.   On: 01/28/2022 19:05   ECHOCARDIOGRAM COMPLETE  Result Date: 01/23/2022    ECHOCARDIOGRAM REPORT   Patient Name:   [facility] Date of Exam: 01/23/2022 Medical Rec #:  [id number]           Height:       64.0 in Accession #:    [id number]          Weight:       149.0 lb Date of Birth:  July 03, 1966           BSA:          1.726 m Patient Age:    51 years            BP:           109/62 mmHg Patient Gender: F                   HR:           89 bpm. Exam Location:  Inpatient Procedure: 2D Echo, Cardiac Doppler and Color Doppler Indications:     CHF  History:         Patient has prior history of Echocardiogram examinations, most                  recent 05/24/2021.  Sonographer:     Jefferey Pica Referring Phys:  [id number] Brand Males Diagnosing Phys: Candee Furbish MD IMPRESSIONS  1. Left ventricular ejection fraction, by estimation, is 60 to 65%. The left ventricle has normal function. The left ventricle has no regional wall motion abnormalities. Left ventricular diastolic parameters are consistent with Grade I diastolic dysfunction (impaired relaxation).  2. Right ventricular systolic function is normal. The right ventricular size is normal.  3. The mitral valve is normal in structure. No evidence of mitral valve regurgitation. No evidence of mitral stenosis.  4. The aortic valve is normal in structure. Aortic valve regurgitation is not visualized. No aortic stenosis is present.  5. The inferior vena cava is dilated in size with <50% respiratory variability, suggesting right atrial pressure of 15 mmHg. FINDINGS  Left Ventricle: Left ventricular ejection fraction, by estimation, is 60 to 65%. The left ventricle has normal function. The left  ventricle has no regional wall motion abnormalities. The left ventricular internal cavity size was normal in size. There is  no left ventricular hypertrophy. Left ventricular diastolic parameters are consistent with Grade I diastolic dysfunction (impaired relaxation). Right Ventricle: The right ventricular size is normal. No increase in right ventricular wall thickness. Right ventricular systolic function is normal. Left Atrium: Left atrial size was normal in size. Right Atrium: Right atrial size was normal in size. Pericardium: There is no evidence of pericardial effusion. Mitral Valve: The mitral valve is normal in structure. No evidence of mitral valve regurgitation. No evidence of mitral valve stenosis. Tricuspid Valve: The tricuspid valve is normal in structure. Tricuspid valve regurgitation is trivial. No evidence of tricuspid stenosis. Aortic Valve: The aortic valve is normal in structure. Aortic  valve regurgitation is not visualized. No aortic stenosis is present. Aortic valve peak gradient measures 5.8 mmHg. Pulmonic Valve: The pulmonic valve was normal in structure. Pulmonic valve regurgitation is not visualized. No evidence of pulmonic stenosis. Aorta: The aortic root is normal in size and structure. Venous: The inferior vena cava is dilated in size with less than 50% respiratory variability, suggesting right atrial pressure of 15 mmHg. IAS/Shunts: No atrial level shunt detected by color flow Doppler.  LEFT VENTRICLE PLAX 2D LVIDd:         4.50 cm   Diastology LVIDs:         3.25 cm   LV e' medial:    10.90 cm/s LV PW:         0.90 cm   LV E/e' medial:  7.8 LV IVS:        0.90 cm   LV e' lateral:   10.10 cm/s LVOT diam:     2.10 cm   LV E/e' lateral: 8.4 LV SV:         71 LV SV Index:   41 LVOT Area:     3.46 cm  RIGHT VENTRICLE             IVC RV Basal diam:  2.90 cm     IVC diam: 2.10 cm RV S prime:     14.20 cm/s TAPSE (M-mode): 2.4 cm LEFT ATRIUM             Index        RIGHT ATRIUM           Index  LA diam:        2.90 cm 1.68 cm/m   RA Area:     13.20 cm LA Vol (A2C):   44.4 ml 25.72 ml/m  RA Volume:   33.00 ml  19.11 ml/m LA Vol (A4C):   48.2 ml 27.92 ml/m LA Biplane Vol: 46.1 ml 26.70 ml/m  AORTIC VALVE                 PULMONIC VALVE AV Area (Vmax): 3.25 cm     PV Vmax:       0.79 m/s AV Vmax:        120.50 cm/s  PV Peak grad:  2.5 mmHg AV Peak Grad:   5.8 mmHg LVOT Vmax:      113.00 cm/s LVOT Vmean:     73.900 cm/s LVOT VTI:       0.205 m  AORTA Ao Root diam: 3.90 cm Ao Asc diam:  3.90 cm MITRAL VALVE MV Area (PHT): 7.29 cm    SHUNTS MV Decel Time: 104 msec    Systemic VTI:  0.20 m MV E velocity: 84.90 cm/s  Systemic Diam: 2.10 cm MV A velocity: 85.40 cm/s MV E/A ratio:  0.99 Candee Furbish MD Electronically signed by Candee Furbish MD Signature Date/Time: 01/23/2022/1:13:41 PM    Final (Updated)    CT IMAGE GUIDED DRAINAGE BY PERCUTANEOUS CATHETER  Result Date: 01/30/2022 CLINICAL DATA:  Right upper quadrant abdominal pain. Right perinephric fluid collection on CT, 1st demonstrated 01/21/2022, new from previous CT 01/15/2022. Intra-abdominal abscess suspected following ERCP. Stable size of large complex right perinephric fluid collection on MR, with heterogeneous peripheral enhancement following contrast, highly suspicious for an infected fluid collection. Differential considerations include infected urinoma and infected biloma given the patient's recent biliary intervention. EXAM: CT GUIDED DRAINAGE OF RETROPERITONEAL ABSCESS ANESTHESIA/SEDATION: Intravenous Fentanyl 49mg and Versed 0.'5mg'$  were administered as conscious sedation during  continuous monitoring of the patient's level of consciousness and physiological / cardiorespiratory status by the radiology RN, with a total moderate sedation time of 18 minutes. PROCEDURE: The procedure, risks, benefits, and alternatives were explained to the patient. Questions regarding the procedure were encouraged and answered. The patient understands and consents  to the procedure. Patient placed prone. Select axial scans through the mid abdomen were obtained. The right retroperitoneal collection was localized and an appropriate skin site was determined and marked. Under CT fluoroscopic guidance, 18 gauge trocar needle advanced into the collection. Purulent material could be aspirated. Amplatz wire advanced easily, position confirmed on CT. Tract dilated to facilitate placement 12 French pigtail drain catheter, formed centrally within the collection. Position confirmed on CT. Approximately 20 mL of purulent material were aspirated and sent for Gram stain and culture. Catheter secured externally with 0 Prolene suture and StatLock and placed to external gravity drain bag The patient tolerated the procedure well. RADIATION DOSE REDUCTION: This exam was performed according to the departmental dose-optimization program which includes automated exposure control, adjustment of the mA and/or kV according to patient size and/or use of iterative reconstruction technique. COMPLICATIONS: None immediate FINDINGS: Multiloculated right retroperitoneal collection was localized. 12 French pigtail drain catheter placed into the inferior dominant component. 20 mL purulent aspirate sent for Gram stain and culture. IMPRESSION: Technically successful CT-guided retroperitoneal abscess drain catheter placement. Electronically Signed   By: Lucrezia Europe M.D.   On: 01/30/2022 16:00   [place] EKG SITE RITE  Result Date: 01/31/2022 If Site Rite image not attached, placement could not be confirmed due to current cardiac rhythm.  CT CHEST ABDOMEN PELVIS WO CONTRAST  Result Date: 01/16/2022 CLINICAL DATA:  Aortic aneurysm, known or suspected abdominal pain, known aortic aneurysm, s/p ERCP earlier today. Abdominal pain, nausea. EXAM: CT CHEST, ABDOMEN AND PELVIS WITHOUT CONTRAST TECHNIQUE: Multidetector CT imaging of the chest, abdomen and pelvis was performed following the standard protocol without IV  contrast. RADIATION DOSE REDUCTION: This exam was performed according to the departmental dose-optimization program which includes automated exposure control, adjustment of the mA and/or kV according to patient size and/or use of iterative reconstruction technique. COMPARISON:  None. FINDINGS: CT CHEST FINDINGS Cardiovascular: No significant vascular findings. Normal heart size. No pericardial effusion. Mediastinum/Nodes: No enlarged mediastinal, hilar, or axillary lymph nodes. Thyroid gland, trachea, and esophagus demonstrate no significant findings. Lungs/Pleura: Lungs are clear. No pleural effusion or pneumothorax. Musculoskeletal: No chest wall mass or suspicious bone lesions identified. CT ABDOMEN PELVIS FINDINGS Hepatobiliary: Status post cholecystectomy. Mild intra and extrahepatic pneumobilia in keeping with given history of recent sphincterotomy. Several punctate foci of gas seen adjacent to the surgical clips may represent gas within the residual cystic duct. There is interstitial fluid extending along the anti mesenteric border of the duodenum into the hepatic hilum, either postsurgical in nature or potentially inflammatory related to inflammation of the pancreatic head or second portion of the duodenum. Pancreas: Mild peripancreatic inflammatory stranding surrounds the head and uncinate process of the pancreas, best seen on image # 61/2, which may reflect changes of acute pancreatitis or retroperitoneal fluid related to recent surgical procedure. The body and tail the pancreas are unremarkable. No loculated peripancreatic fluid collections. Spleen: Unremarkable Adrenals/Urinary Tract: Adrenal glands are unremarkable. Kidneys are normal, without renal calculi, focal lesion, or hydronephrosis. Bladder is unremarkable. Stomach/Bowel: Moderate stool within the colon. No evidence of obstruction. Periduodenal fluid seen within the retroperitoneum adjacent to the second portion of the duodenum extending of the  hepatic  hilum, described above. Stomach, small bowel, and large bowel are otherwise unremarkable. Appendix normal. Vascular/Lymphatic: The abdominal vasculature is unremarkable on this noncontrast examination. No pathologic adenopathy. Reproductive: The pelvic organs are unremarkable. Other: No significant abdominal wall hernia. Musculoskeletal: 2.7 cm circumscribed lytic lesion demonstrating remodeling of the adjacent sacrum is in keeping with a perineural cyst. No acute bone abnormality. IMPRESSION: Interval development of mild intra and extrahepatic pneumobilia in keeping with given history of ERCP and sphincterotomy. Punctate foci of gas within the hepatic hilum may represent gas within the remnant of the cystic duct or, less likely, extraluminal gas. Infiltrative fluid seen adjacent to the uncinate process and head of the pancreas as well as along the anti mesenteric border of the second portion of the duodenum extending into the hepatic hilum. This may represent postprocedural fluid/change. Alternatively, this may reflect inflammatory fluid related to acute pancreatitis or enteric fluid related to duodenal injury/perforation with retroperitoneal leakage. Correlation with laboratory examination including serum lipase may be helpful in further evaluating this finding. Moderate stool burden. Electronically Signed   By: Fidela Salisbury M.D.   On: 01/16/2022 00:27   IR Radiologist Eval & Mgmt  Result Date: 02/08/2022 Please refer to notes tab for details about interventional procedure. (Op Note)  US THORACENTESIS ASP PLEURAL SPACE W/IMG GUIDE  Result Date: 01/23/2022 INDICATION: Pleural effusion, biliary obstruction, possible pancreatitis EXAM: ULTRASOUND GUIDED RIGHT THORACENTESIS MEDICATIONS: None. COMPLICATIONS: None immediate. PROCEDURE: An ultrasound guided thoracentesis was thoroughly discussed with the patient and questions answered. The benefits, risks, alternatives and complications were also  discussed. The patient understands and wishes to proceed with the procedure. Written consent was obtained. Ultrasound was performed to localize and mark an adequate pocket of fluid in the right chest. The area was then prepped and draped in the normal sterile fashion. 1% Lidocaine was used for local anesthesia. Under ultrasound guidance a 6 Fr Safe-T-Centesis catheter was introduced. Thoracentesis was performed. The catheter was removed and a dressing applied. FINDINGS: A total of approximately 300cc of yellow fluid was removed. Samples were sent to the laboratory as requested by the clinical team. IMPRESSION: Successful ultrasound guided right thoracentesis yielding 300cc of pleural fluid. Performed and dictated by Pasty Spillers, PA-C Electronically Signed   By: Aletta Edouard M.D.   On: 01/23/2022 15:21    Labs:  CBC: Recent Labs    01/28/22 0343 01/29/22 0401 01/30/22 0401 02/01/22 0327  WBC 19.9* 18.3* 13.6* 10.7*  HGB 10.5* 11.0* 11.2* 10.8*  HCT 33.0* 35.0* 34.3* 33.8*  PLT 463* 518* 535* 541*    COAGS: Recent Labs    01/29/22 1410  INR 1.1    BMP: Recent Labs    01/28/22 0343 01/29/22 0401 01/30/22 0401 02/01/22 0327  NA 140 137 140 138  K 3.4* 3.6 3.8 3.9  CL 106 104 105 105  CO2 '26 23 27 26  '$ GLUCOSE 102* 90 97 93  BUN '6 8 7 11  '$ CALCIUM 8.3* 8.2* 8.3* 8.4*  CREATININE 0.46 0.55 0.60 0.53  GFRNONAA >60 >60 >60 >60    LIVER FUNCTION TESTS: Recent Labs    01/27/22 1834 01/28/22 0343 01/29/22 0401 01/30/22 0401  BILITOT 0.5 0.5 0.5 0.5  AST 28 18 11* 11*  ALT '25 20 17 15  '$ ALKPHOS 71 63 67 67  PROT 6.8 5.8* 5.9* 6.0*  ALBUMIN 3.1* 2.7* 2.7* 2.6*    TUMOR MARKERS: No results for input(s): AFPTM, CEA, CA199, CHROMGRNA in the last 8760 hours.  Assessment and Plan:  Rt perinephric abscess drain evaluation For possible repositioning; aspiration or new drain placement Pt is aware of procedure benefits and risks including but not limited to Infection,  bleeding, damage to surrounding structures Agreeable to proceed Consent signed and in chart   Thank you for this interesting consult.  I greatly enjoyed meeting LARKYN GREENBERGER and look forward to participating in their care.  A copy of this report was sent to the requesting provider on this date.  Electronically Signed: Lavonia Drafts, PA-C 02/11/2022, 9:02 AM   I spent a total of    25 Minutes in face to face in clinical consultation, greater than 50% of which was counseling/coordinating care for perinephric abscess drain evaluation

## 2022-02-12 ENCOUNTER — Other Ambulatory Visit: Payer: Self-pay | Admitting: Primary Care

## 2022-02-12 ENCOUNTER — Other Ambulatory Visit: Payer: Self-pay | Admitting: Internal Medicine

## 2022-02-12 DIAGNOSIS — J90 Pleural effusion, not elsewhere classified: Secondary | ICD-10-CM

## 2022-02-12 MED ORDER — FLUCONAZOLE 200 MG PO TABS: 400.0000 mg | ORAL_TABLET | Freq: Every day | ORAL | 0 refills | Status: DC

## 2022-02-12 MED ORDER — AMOXICILLIN-POT CLAVULANATE 875-125 MG PO TABS: 1.0000 | ORAL_TABLET | Freq: Two times a day (BID) | ORAL | 0 refills | Status: DC

## 2022-02-12 NOTE — Progress Notes (Signed)
Spoke with pt and notified of results per [facility]. Pt verbalized understanding and denied any questions. CXR for 4 wks was ordered.

## 2022-02-13 ENCOUNTER — Telehealth: Payer: Self-pay

## 2022-02-13 ENCOUNTER — Other Ambulatory Visit: Payer: Self-pay | Admitting: Physician Assistant

## 2022-02-13 ENCOUNTER — Other Ambulatory Visit: Payer: Self-pay | Admitting: Internal Medicine

## 2022-02-13 DIAGNOSIS — B999 Unspecified infectious disease: Secondary | ICD-10-CM

## 2022-02-13 NOTE — Telephone Encounter (Signed)
Received short term disability forms, placed in Dr. Alcario Drought box. Copy in triage.   Beryle Flock, RN

## 2022-02-15 ENCOUNTER — Other Ambulatory Visit: Payer: Self-pay

## 2022-02-15 ENCOUNTER — Ambulatory Visit: Payer: 59 | Admitting: Internal Medicine

## 2022-02-15 ENCOUNTER — Encounter: Payer: Self-pay | Admitting: Internal Medicine

## 2022-02-15 VITALS — BP 102/70 | HR 87 | Temp 98.5°F | Wt 134.0 lb

## 2022-02-15 DIAGNOSIS — A0811 Acute gastroenteropathy due to Norwalk agent: Secondary | ICD-10-CM

## 2022-02-15 DIAGNOSIS — J90 Pleural effusion, not elsewhere classified: Secondary | ICD-10-CM | POA: Diagnosis not present

## 2022-02-15 DIAGNOSIS — K651 Peritoneal abscess: Secondary | ICD-10-CM

## 2022-02-15 NOTE — Assessment & Plan Note (Signed)
She has residual small right sided pleural effusion felt to be reactive from intra-abdominal infection.  Status post thoracentesis while admitted and will follow up with pulmonary in July and repeat CXR also planned.  Her CT abd/pelvis this week captured the lower lung field and did note see any complicating features of the small effusion.

## 2022-02-15 NOTE — Progress Notes (Signed)
[facility]  CHIEF COMPLAINT:    Follow up for intra-abdominal abscess following ERCP  SUBJECTIVE:    Laurie Terry is a 56 y.o. female with PMHx as below who presents to the clinic for intra-abdominal abscess.   She has had a difficult course recently following ERCP with Dr Watt Climes on 01/15/22.  This was complicated by post operative fevers, abdominal pain, diarrhea, and right sided pleural effusion (felt to be reactive from intra abdominal process).  She ultimately was treated with several days of IV antibiotics and CT imaging without contrast due to allergy showed fluid collection but nothing that was obviously drainable.  Underwent MRI abdomen with contrast 5/8 that subsequently showed a large complex fluid collection in the subhepatic/perinephric space.  She underwent IR guided drainage on 5/10.  Cultures grew E faecalis and yeast.  She was discharged on Augmentin and Fluconazole.  Following discharge, she continued to have some low grade fevers and abdominal pain.  She saw Dr Johnnye Sima on 5/18 and was doing okay that visit.  Current therapy was continued.  Follow up with IR the following day 5/19 with continued thick purulent output of about 20-53m daily.  CT suggested improvement in the perinephric abscess but the superior aspect of collection had not been drained well at that time.   She came back to IR 5/22 and underwent repositioning and exchange of right sided drain.  She reports following this procedure there was about 427mof dark, green liquid.  She continues to have 10-15 mL of murky drainage daily.    She will be going back to IR on 5/31 for repeat evaluation.  She is continuing to take Augmentin and Fluconazole.  She has short term disability papers that need to be filled out.  She has been running temperatures in the 99 range since recent IR visit.  She reports fluconazole leaves a funny taste in her mouth.  Her diarrhea has improved following  norovirus infection. She continues to have right sided chest discomfort radiating to her shoulder with deep inspiration.  Please see A&P for the details of today's visit and status of the patient's medical problems.   Patient's Medications  New Prescriptions   No medications on file  Previous Medications   ACETAMINOPHEN (TYLENOL) 500 MG TABLET    Take 1,000 mg by mouth every 6 (six) hours as needed for mild pain or fever.   ALBUTEROL (PROAIR HFA) 108 (90 BASE) MCG/ACT INHALER    Inhale 2 puffs into the lungs every 4 (four) hours as needed for wheezing or shortness of breath (coughing fits).   AMOXICILLIN-CLAVULANATE (AUGMENTIN) 875-125 MG TABLET    Take 1 tablet by mouth every 12 (twelve) hours for 14 days.   CHLORPHENIRAMINE-HYDROCODONE 10-8 MG/5ML    Take 5 mLs by mouth at bedtime for 14 doses.   CHOLESTYRAMINE LIGHT (PREVALITE) 4 G PACKET    Take 1 packet (4 g total) by mouth 2 (two) times daily.   DEXTROMETHORPHAN (DELSYM) 30 MG/5ML LIQUID    Take 5 mLs (30 mg total) by mouth 2 (two) times daily.   DICLOFENAC SODIUM (VOLTAREN) 1 % GEL    Apply 4 g topically 4 (four) times daily.   DICYCLOMINE (BENTYL) 10 MG CAPSULE    Take 1 capsule (10 mg total) by mouth 3 (three) times daily as needed for spasms.   DIPHENHYDRAMINE (BENADRYL) 50 MG CAPSULE    Take one capsule 1 hour prior to scan.  ELETRIPTAN (RELPAX) 40 MG TABLET    40 mg every 2 (two) hours as needed for migraine.   EPINEPHRINE 0.3 MG/0.3 ML IJ SOAJ INJECTION    Inject 0.3 mg into the muscle as needed for anaphylaxis.   FAMOTIDINE (PEPCID) 20 MG TABLET    Take 20 mg by mouth daily as needed for heartburn or indigestion.   FLUCONAZOLE (DIFLUCAN) 200 MG TABLET    Take 2 tablets (400 mg total) by mouth daily for 14 days.   FLUTICASONE (FLONASE) 50 MCG/ACT NASAL SPRAY    Place 2 sprays into both nostrils 2 (two) times daily as needed for allergies.   KETOTIFEN (ZADITOR) 0.025 % OPHTHALMIC SOLUTION    Place 1 drop into both eyes daily as  needed (Itching eye and allergies).   METOPROLOL TARTRATE (LOPRESSOR) 25 MG TABLET    Take 1 tablet (25 mg total) by mouth daily as needed (svt).   PREDNISONE (DELTASONE) 50 MG TABLET    Take one tablet 13 hours, 7 hours, and 1 hour prior to scan.   SODIUM CHLORIDE FLUSH (NORMAL SALINE FLUSH) 0.9 % SOLN    Flush right drain with 5 ml of sterile saline once daily   TRAMADOL (ULTRAM) 50 MG TABLET    Take 1 tablet (50 mg total) by mouth every 6 (six) hours as needed for moderate pain.  Modified Medications   No medications on file  Discontinued Medications   No medications on file      Past Medical History:  Diagnosis Date   Allergic rhinitis    Aortic aneurysm (HCC) 09/23/2020   Cancer (HCC)    Melanoma   Cervical dysplasia 2007   low-grade SIL, normal Pap smears since then   Family history of adverse reaction to anesthesia    mom has h/o post op nausea/vomiting   Intermittent palpitations    IUD    inserted 03/2002.04/06/2007, 06/03/2012   Ocular migraine    Osteomalacia    Pancreatitis    Seafood allergy     Social History   Tobacco Use   Smoking status: Former   Smokeless tobacco: Never  Scientific laboratory technician Use: Never used  Substance Use Topics   Alcohol use: Not Currently    Comment: Rare   Drug use: No    Family History  Problem Relation Age of Onset   Tremor Mother    Stroke Father    Autoimmune disease Father    CVA Father    Breast cancer Sister 43   Arthritis Sister        Psoriatic   Thyroid disease Sister    Breast cancer Maternal Grandmother        Age 71   Thyroid disease Daughter        Hypo    Allergies  Allergen Reactions   Cefepime Cough    Patient develops cough, rash in her ears.    Contrast Media [Iodinated Contrast Media] Shortness Of Breath    Low blood pressure    Iodine Shortness Of Breath and Swelling   Other Hives and Swelling    Seafood   Ampicillin Hives    Childhood allergy   Avocado Itching    Tongue and mouth    Levofloxacin Hives    Pt already has aortic aneurysm.   Pantoprazole Other (See Comments)    Joint aches, fever, peeling of skin   Septra [Bactrim] Hives   Sulfamethoxazole-Trimethoprim Other (See Comments) and Hives   Vancomycin Cough  Patient develops cough, rash ears, tingling lips after receiving Cefepime and vancomycin 01/23/2022   Colestipol Hcl Rash    Review of Systems  All other systems reviewed and are negative. Except as noted above.    OBJECTIVE:    Vitals:   02/15/22 1456  BP: 102/70  Pulse: 87  Temp: 98.5 F (36.9 C)  TempSrc: Oral  SpO2: 99%  Weight: 134 lb (60.8 kg)   Body mass index is 22.3 kg/m.  Physical Exam Constitutional:      General: She is not in acute distress.    Appearance: Normal appearance.  HENT:     Head: Normocephalic and atraumatic.  Eyes:     Extraocular Movements: Extraocular movements intact.     Conjunctiva/sclera: Conjunctivae normal.  Pulmonary:     Effort: Pulmonary effort is normal. No respiratory distress.  Abdominal:     General: There is no distension.     Palpations: Abdomen is soft.     Comments: IR drain in place with purulent fluid in bag.  Musculoskeletal:        General: Normal range of motion.     Cervical back: Normal range of motion and neck supple.  Skin:    General: Skin is warm and dry.     Findings: No rash.  Neurological:     General: No focal deficit present.     Mental Status: She is alert and oriented to person, place, and time.  Psychiatric:        Mood and Affect: Mood normal.        Behavior: Behavior normal.     Labs and Microbiology:    Latest Ref Rng & Units 02/01/2022    3:27 AM 01/30/2022    4:01 AM 01/29/2022    4:01 AM  CBC  WBC 4.0 - 10.5 K/uL 10.7   13.6   18.3    Hemoglobin 12.0 - 15.0 g/dL 10.8   11.2   11.0    Hematocrit 36.0 - 46.0 % 33.8   34.3   35.0    Platelets 150 - 400 K/uL 541   535   518        Latest Ref Rng & Units 02/01/2022    3:27 AM 01/30/2022    4:01 AM  01/29/2022    4:01 AM  CMP  Glucose 70 - 99 mg/dL 93   97   90    BUN 6 - 20 mg/dL '11   7   8    ' Creatinine 0.44 - 1.00 mg/dL 0.53   0.60   0.55    Sodium 135 - 145 mmol/L 138   140   137    Potassium 3.5 - 5.1 mmol/L 3.9   3.8   3.6    Chloride 98 - 111 mmol/L 105   105   104    CO2 22 - 32 mmol/L '26   27   23    ' Calcium 8.9 - 10.3 mg/dL 8.4   8.3   8.2    Total Protein 6.5 - 8.1 g/dL  6.0   5.9    Total Bilirubin 0.3 - 1.2 mg/dL  0.5   0.5    Alkaline Phos 38 - 126 U/L  67   67    AST 15 - 41 U/L  11   11    ALT 0 - 44 U/L  15   17        ASSESSMENT & PLAN:    Iatrogenic subdiaphragmatic  abscess [facility]) She is slowly improving although not back to her baseline.  She continues to have purulent drainage from her IR drain and will follow up with IR next week for repeat drain evaluation.  Fortunately, fever curve has improved this week.  She will continue Augmentin and Fluconazole for now.  Check labs today and will follow up in a couple weeks to reassess how things are going.  I will fill out her short term disability paperwork.  Pleural effusion on right She has residual small right sided pleural effusion felt to be reactive from intra-abdominal infection.  Status post thoracentesis while admitted and will follow up with pulmonary in July and repeat CXR also planned.  Her CT abd/pelvis this week captured the lower lung field and did note see any complicating features of the small effusion.  Norovirus She contracted norovirus during all of this that was detected on a GI panel at her follow up appointment with [facility].  Her diarrhea has now improved.   Orders Placed This Encounter  Procedures   Sedimentation rate   C-reactive protein   CBC w/Diff   Comp Met (CMET)      Raynelle Highland [facility] 02/15/2022, 4:06 PM  I have personally spent 40 minutes involved in face-to-face and non-face-to-face activities for this patient  on the day of the visit. Professional time spent includes the following activities: Preparing to see the patient (review of tests), Obtaining and/or reviewing separately obtained history (admission/discharge record), Performing a medically appropriate examination and/or evaluation , Ordering medications/tests/procedures, referring and communicating with other health care professionals, Documenting clinical information in the EMR, Independently interpreting results (not separately reported), Communicating results to the patient/family/caregiver, Counseling and educating the patient/family/caregiver and Care coordination (not separately reported).

## 2022-02-15 NOTE — Telephone Encounter (Signed)
Forms completed by Dr. Juleen China. Completed original at front desk for patient to pick up. Copy of completed forms in triage.   Beryle Flock, RN

## 2022-02-15 NOTE — Assessment & Plan Note (Signed)
She is slowly improving although not back to her baseline.  She continues to have purulent drainage from her IR drain and will follow up with IR next week for repeat drain evaluation.  Fortunately, fever curve has improved this week.  She will continue Augmentin and Fluconazole for now.  Check labs today and will follow up in a couple weeks to reassess how things are going.  I will fill out her short term disability paperwork.

## 2022-02-15 NOTE — Patient Instructions (Signed)
Thank you for coming to see me today. It was a pleasure seeing you.  To Do: Labs today Continue Augmentin and Fluconazole.  If you need any refills prior to follow up with me please let us know Follow up in 2-3 weeks via virtual visit  If you have any questions or concerns, please do not hesitate to call the office at [phone].  Take Care,   Jule Ser, DO

## 2022-02-15 NOTE — Assessment & Plan Note (Signed)
She contracted norovirus during all of this that was detected on a GI panel at her follow up appointment with [facility].  Her diarrhea has now improved.

## 2022-02-16 LAB — COMPREHENSIVE METABOLIC PANEL
AG Ratio: 1.4 (calc) (ref 1.0–2.5)
ALT: 28 U/L (ref 6–29)
AST: 16 U/L (ref 10–35)
Albumin: 3.7 g/dL (ref 3.6–5.1)
Alkaline phosphatase (APISO): 104 U/L (ref 37–153)
BUN: 13 mg/dL (ref 7–25)
CO2: 29 mmol/L (ref 20–32)
Calcium: 9.5 mg/dL (ref 8.6–10.4)
Chloride: 101 mmol/L (ref 98–110)
Creat: 0.64 mg/dL (ref 0.50–1.03)
Globulin: 2.7 g/dL (calc) (ref 1.9–3.7)
Glucose, Bld: 94 mg/dL (ref 65–99)
Potassium: 4.2 mmol/L (ref 3.5–5.3)
Sodium: 142 mmol/L (ref 135–146)
Total Bilirubin: 0.3 mg/dL (ref 0.2–1.2)
Total Protein: 6.4 g/dL (ref 6.1–8.1)

## 2022-02-16 LAB — CBC WITH DIFFERENTIAL/PLATELET
Absolute Monocytes: 658 cells/uL (ref 200–950)
Basophils Absolute: 85 cells/uL (ref 0–200)
Basophils Relative: 0.9 %
Eosinophils Absolute: 508 cells/uL — ABNORMAL HIGH (ref 15–500)
Eosinophils Relative: 5.4 %
HCT: 37.1 % (ref 35.0–45.0)
Hemoglobin: 12.4 g/dL (ref 11.7–15.5)
Lymphs Abs: 1626 cells/uL (ref 850–3900)
MCH: 28.8 pg (ref 27.0–33.0)
MCHC: 33.4 g/dL (ref 32.0–36.0)
MCV: 86.3 fL (ref 80.0–100.0)
MPV: 9.8 fL (ref 7.5–12.5)
Monocytes Relative: 7 %
Neutro Abs: 6524 cells/uL (ref 1500–7800)
Neutrophils Relative %: 69.4 %
Platelets: 449 10*3/uL — ABNORMAL HIGH (ref 140–400)
RBC: 4.3 10*6/uL (ref 3.80–5.10)
RDW: 13.7 % (ref 11.0–15.0)
Total Lymphocyte: 17.3 %
WBC: 9.4 10*3/uL (ref 3.8–10.8)

## 2022-02-16 LAB — SEDIMENTATION RATE: Sed Rate: 31 mm/h — ABNORMAL HIGH (ref 0–30)

## 2022-02-16 LAB — C-REACTIVE PROTEIN: CRP: 11 mg/L — ABNORMAL HIGH (ref <8.0)

## 2022-02-20 ENCOUNTER — Ambulatory Visit
Admission: RE | Admit: 2022-02-20 | Discharge: 2022-02-20 | Disposition: A | Discharge status: Home / Self Care | Payer: 59 | Source: Ambulatory Visit | Attending: Physician Assistant | Admitting: Physician Assistant

## 2022-02-20 ENCOUNTER — Encounter: Payer: Self-pay | Admitting: *Deleted

## 2022-02-20 ENCOUNTER — Other Ambulatory Visit: Payer: Self-pay | Admitting: Internal Medicine

## 2022-02-20 ENCOUNTER — Ambulatory Visit
Admission: RE | Admit: 2022-02-20 | Discharge: 2022-02-20 | Disposition: A | Discharge status: Home / Self Care | Payer: 59 | Source: Ambulatory Visit | Attending: Internal Medicine | Admitting: Internal Medicine

## 2022-02-20 DIAGNOSIS — B999 Unspecified infectious disease: Secondary | ICD-10-CM

## 2022-02-20 HISTORY — PX: IR RADIOLOGIST EVAL & MGMT: IMG5224

## 2022-02-20 NOTE — Progress Notes (Signed)
Chief Complaint: The patient is seen in follow up today s/p perinephric drain exchange 1 week ago  History of present illness:  Perinephric abscess drain placed in IR 01/30/22, fever and flank pain returned, drain replaced and repositioned 02/12/22, reports feeling much better.  Notes temp seems to stay around 99.  Having minimal OP now.  Still on antibiotic.    Past Medical History:  Diagnosis Date   Allergic rhinitis    Aortic aneurysm ([place]) 09/23/2020   Cancer ([place])    Melanoma   Cervical dysplasia 2007   low-grade SIL, normal Pap smears since then   Family history of adverse reaction to anesthesia    mom has h/o post op nausea/vomiting   Intermittent palpitations    IUD    inserted 03/2002.04/06/2007, 06/03/2012   Ocular migraine    Osteomalacia    Pancreatitis    Seafood allergy     Past Surgical History:  Procedure Laterality Date   BIOPSY  01/21/2022   Procedure: BIOPSY;  Surgeon: Wilford Corner, MD;  Location: [facility];  Service: Gastroenterology;;   Lillard Anes  2018   CHOLECYSTECTOMY N/A 04/09/2016   Procedure: LAPAROSCOPIC CHOLECYSTECTOMY ;  Surgeon: Rolm Bookbinder, MD;  Location: [facility];  Service: General;  Laterality: N/A;   ERCP  04/11/2016   ERCP with pancreatic stent and failed CBD stent after needle knife and conventional sphincterotomy   ERCP N/A 04/12/2016   Procedure: ENDOSCOPIC RETROGRADE CHOLANGIOPANCREATOGRAPHY (ERCP);  Surgeon: Clarene Essex, MD;  [facility];  Service: Endoscopy;  Laterality: N/A;   ERCP N/A 01/15/2022   Procedure: ENDOSCOPIC RETROGRADE CHOLANGIOPANCREATOGRAPHY (ERCP);  Surgeon: Clarene Essex, MD;  Location: [facility];  Service: Gastroenterology;  Laterality: N/A;   FLEXIBLE SIGMOIDOSCOPY N/A 01/21/2022   Procedure: FLEXIBLE SIGMOIDOSCOPY;  Surgeon: Wilford Corner, MD;  Location: [facility] ENDOSCOPY;  Service: Gastroenterology;  Laterality: N/A;   FOOT SURGERY     toenail   INTRAUTERINE DEVICE INSERTION  08/07/2017   Mirena    IR EXCHANGE BILIARY DRAIN  02/11/2022   IR RADIOLOGIST EVAL & MGMT  02/08/2022   IR RADIOLOGIST EVAL & MGMT  02/20/2022   removal of melanoma     REMOVAL OF STONES  01/15/2022   Procedure: REMOVAL OF STONES;  Surgeon: Clarene Essex, MD;  Location: [facility];  Service: Gastroenterology;;   [name]  01/15/2022   Procedure: [name];  Surgeon: Clarene Essex, MD;  Location: [facility] ENDOSCOPY;  Service: Gastroenterology;;    Allergies: Cefepime, Contrast media [iodinated contrast media], Iodine, Other, Ampicillin, Avocado, Levofloxacin, Pantoprazole, Septra [bactrim], Sulfamethoxazole-trimethoprim, Vancomycin, and Colestipol hcl  Medications: Prior to Admission medications   Medication Sig Start Date End Date Taking? Authorizing Provider  acetaminophen (TYLENOL) 500 MG tablet Take 1,000 mg by mouth every 6 (six) hours as needed for mild pain or fever.    [provider]  albuterol (PROAIR HFA) 108 (90 Base) MCG/ACT inhaler Inhale 2 puffs into the lungs every 4 (four) hours as needed for wheezing or shortness of breath (coughing fits). 05/23/21   Garnet Sierras, DO  amoxicillin-clavulanate (AUGMENTIN) 875-125 MG tablet Take 1 tablet by mouth every 12 (twelve) hours for 14 days. 02/12/22 02/26/22  Mignon Pine, DO  cholestyramine light (PREVALITE) 4 g packet Take 1 packet (4 g total) by mouth 2 (two) times daily. Patient taking differently: Take 4 g by mouth as needed. 01/26/22   Regalado, Belkys A, MD  dextromethorphan (DELSYM) 30 MG/5ML liquid Take 5 mLs (30 mg total) by mouth 2 (two) times daily. Patient  taking differently: Take 30 mg by mouth as needed for cough. 02/02/22   Debbe Odea, MD  diclofenac Sodium (VOLTAREN) 1 % GEL Apply 4 g topically 4 (four) times daily. 02/02/22   Debbe Odea, MD  dicyclomine (BENTYL) 10 MG capsule Take 1 capsule (10 mg total) by mouth 3 (three) times daily as needed for spasms. 02/02/22 03/04/22  Debbe Odea, MD  diphenhydrAMINE (BENADRYL) 50 MG  capsule Take one capsule 1 hour prior to scan. 02/08/22   Docia Barrier, PA  eletriptan (RELPAX) 40 MG tablet 40 mg every 2 (two) hours as needed for migraine. 05/18/20   [provider]  EPINEPHrine 0.3 mg/0.3 mL IJ SOAJ injection Inject 0.3 mg into the muscle as needed for anaphylaxis.    [provider]  famotidine (PEPCID) 20 MG tablet Take 20 mg by mouth daily as needed for heartburn or indigestion.    [provider]  fluconazole (DIFLUCAN) 200 MG tablet Take 2 tablets (400 mg total) by mouth daily for 14 days. 02/12/22 02/26/22  Mignon Pine, DO  fluticasone (FLONASE) 50 MCG/ACT nasal spray Place 2 sprays into both nostrils 2 (two) times daily as needed for allergies. 07/14/20   [provider]  ketotifen (ZADITOR) 0.025 % ophthalmic solution Place 1 drop into both eyes daily as needed (Itching eye and allergies).    [provider]  metoprolol tartrate (LOPRESSOR) 25 MG tablet Take 1 tablet (25 mg total) by mouth daily as needed (svt). 01/26/22   Regalado, Belkys A, MD  predniSONE (DELTASONE) 50 MG tablet Take one tablet 13 hours, 7 hours, and 1 hour prior to scan. 02/08/22   Docia Barrier, PA  Sodium Chloride Flush (NORMAL SALINE FLUSH) 0.9 % SOLN Flush right drain with 5 ml of sterile saline once daily 02/01/22     traMADol (ULTRAM) 50 MG tablet Take 1 tablet (50 mg total) by mouth every 6 (six) hours as needed for moderate pain. 02/02/22   Debbe Odea, MD     Family History  Problem Relation Age of Onset   Tremor Mother    Stroke Father    Autoimmune disease Father    CVA Father    Breast cancer Sister 71   Arthritis Sister        Psoriatic   Thyroid disease Sister    Breast cancer Maternal Grandmother        Age 23   Thyroid disease Daughter        Hypo    Social History   Socioeconomic History   Marital status: Married    Spouse name: Not on file   Number of children: Not on file   Years of education:  Not on file   Highest education level: Not on file  Occupational History   Not on file  Tobacco Use   Smoking status: Former   Smokeless tobacco: Never  Vaping Use   Vaping Use: Never used  Substance and Sexual Activity   Alcohol use: Not Currently    Comment: Rare   Drug use: No   Sexual activity: Yes    Birth control/protection: Post-menopausal    Comment:  declined sexual hx questions,des neg  Other Topics Concern   Not on file  Social History Narrative   Not on file   Social Determinants of Health   Financial Resource Strain: Not on file  Food Insecurity: Not on file  Transportation Needs: Not on file  Physical Activity: Not on file  Stress: Not on  file  Social Connections: Not on file     Vital Signs: There were no vitals taken for this visit.   Physical Exam Patient is alert and in NAD.  She ambulates normally to exam room and has normal respiratory effort.  Her belly is soft and non tender.  She has no CVA tenderness.  The drain catheter is in place and secured with stat lock and suture.  There is no redness or erythema.  Only a few mL of cloudy serous fluid in the gravity bag.  Imaging: IR Radiologist Eval & Mgmt  Result Date: 02/20/2022 Please refer to notes tab for details about interventional procedure. (Op Note)   Labs:  CBC: Recent Labs    01/29/22 0401 01/30/22 0401 02/01/22 0327 02/15/22 1541  WBC 18.3* 13.6* 10.7* 9.4  HGB 11.0* 11.2* 10.8* 12.4  HCT 35.0* 34.3* 33.8* 37.1  PLT 518* 535* 541* 449*    COAGS: Recent Labs    01/29/22 1410  INR 1.1    BMP: Recent Labs    01/28/22 0343 01/29/22 0401 01/30/22 0401 02/01/22 0327 02/15/22 1541  NA 140 137 140 138 142  K 3.4* 3.6 3.8 3.9 4.2  CL 106 104 105 105 101  CO2 '26 23 27 26 29  '$ GLUCOSE 102* 90 97 93 94  BUN '6 8 7 11 13  '$ CALCIUM 8.3* 8.2* 8.3* 8.4* 9.5  CREATININE 0.46 0.55 0.60 0.53 0.64  GFRNONAA >60 >60 >60 >60  --     LIVER FUNCTION TESTS: Recent Labs     01/27/22 1834 01/28/22 0343 01/29/22 0401 01/30/22 0401 02/15/22 1541  BILITOT 0.5 0.5 0.5 0.5 0.3  AST 28 18 11* 11* 16  ALT '25 20 17 15 28  '$ ALKPHOS 71 63 67 67  --   PROT 6.8 5.8* 5.9* 6.0* 6.4  ALBUMIN 3.1* 2.7* 2.7* 2.6*  --     Assessment:  Perinephric abscess --significant improvement on CT, due to contrast allergy and no pre-medication, opted to do a capping trial.  Pt to return in one week for follow up CT.  If collection does not reform, drain can likely be removed without injection.  New gravity bag provided. Pt verbalizes understanding when to attach bag should symptoms of illness resurface.  SignedPasty Spillers, PA 02/20/2022, 3:51 PM   Please refer to Dr. Maryelizabeth Kaufmann attestation of this note for management and plan.

## 2022-02-21 ENCOUNTER — Other Ambulatory Visit: Payer: Self-pay

## 2022-02-21 MED ORDER — FLUCONAZOLE 200 MG PO TABS: 400.0000 mg | ORAL_TABLET | Freq: Every day | ORAL | 0 refills | Status: DC

## 2022-02-21 MED ORDER — AMOXICILLIN-POT CLAVULANATE 875-125 MG PO TABS: 1.0000 | ORAL_TABLET | Freq: Two times a day (BID) | ORAL | 0 refills | Status: DC

## 2022-02-25 LAB — FUNGUS CULTURE WITH STAIN

## 2022-02-25 LAB — FUNGAL ORGANISM REFLEX

## 2022-02-25 LAB — FUNGUS CULTURE RESULT

## 2022-02-28 ENCOUNTER — Ambulatory Visit
Admission: RE | Admit: 2022-02-28 | Discharge: 2022-02-28 | Disposition: A | Discharge status: Home / Self Care | Payer: 59 | Source: Ambulatory Visit | Attending: Internal Medicine | Admitting: Internal Medicine

## 2022-02-28 ENCOUNTER — Encounter: Payer: Self-pay | Admitting: *Deleted

## 2022-02-28 DIAGNOSIS — B999 Unspecified infectious disease: Secondary | ICD-10-CM

## 2022-02-28 HISTORY — PX: IR RADIOLOGIST EVAL & MGMT: IMG5224

## 2022-02-28 NOTE — Progress Notes (Signed)
Chief Complaint: Sub-hepatic abscess, SP drainage, follow-up  Referring Physician(s): [facility]  PCP: Dr. [name] GI: Dr. Watt Climes  History of Present Illness: Laurie Terry is a 56 y.o. female presenting as scheduled follow up with [facility] for abscess drain.   History:  She has prior gallstone pancreatitis treated by laparoscopic cholecystectomy 04/09/2016.  This operation was complicated by a small amount of fluid within the gallbladder resection bed which was aspirated by interventional radiology in 2017.  Cultures were negative.  In April 2023 she underwent ERCP for choledocholithiasis which was complicated by microperforation and further gallbladder fossa fluid collection treated with antibiotic therapy.  She then returned to the hospital on 5/7 with fever and increasing right upper quadrant pain despite antibiotics.  Follow-up CT imaging demonstrates a large complex fluid collection in the right perinephric retroperitoneal space concerning for abscess.  This was then subsequently drained with a percutaneous drain placement on 01/30/2022.    Interval history: Laurie Terry returns today for follow up with [facility], having started a "capping trial" of the abscess drain at her last visit with [place] 02/20/22.   She did tell met that on Tuesday ~48hours ago, her temperature was 99 and she was feeling generally ill, so she uncapped the drain and attached to gravity bag.   Since then she feels fine, with no further ill feeling and resolution of low grade temp.  She reports scant material in the bag, with only 1-2cc accumulation in the past 2 days.  She continues ABX with the ID team.   Today's CT shows that the residual fluid at the drain site is slightly smaller, certainly not worsening.      Past Medical History:  Diagnosis Date   Allergic rhinitis    Aortic aneurysm ([place]) 09/23/2020   Cancer ([place])    Melanoma   Cervical dysplasia 2007   low-grade SIL, normal Pap  smears since then   Family history of adverse reaction to anesthesia    mom has h/o post op nausea/vomiting   Intermittent palpitations    IUD    inserted 03/2002.04/06/2007, 06/03/2012   Ocular migraine    Osteomalacia    Pancreatitis    Seafood allergy     Past Surgical History:  Procedure Laterality Date   BIOPSY  01/21/2022   Procedure: BIOPSY;  Surgeon: Wilford Corner, MD;  Location: [facility];  Service: Gastroenterology;;   Lillard Anes  2018   CHOLECYSTECTOMY N/A 04/09/2016   Procedure: LAPAROSCOPIC CHOLECYSTECTOMY ;  Surgeon: Rolm Bookbinder, MD;  Location: [place];  Service: General;  Laterality: N/A;   ERCP  04/11/2016   ERCP with pancreatic stent and failed CBD stent after needle knife and conventional sphincterotomy   ERCP N/A 04/12/2016   Procedure: ENDOSCOPIC RETROGRADE CHOLANGIOPANCREATOGRAPHY (ERCP);  Surgeon: Clarene Essex, MD;  Location: [facility];  Service: Endoscopy;  Laterality: N/A;   ERCP N/A 01/15/2022   Procedure: ENDOSCOPIC RETROGRADE CHOLANGIOPANCREATOGRAPHY (ERCP);  Surgeon: Clarene Essex, MD;  Location: [facility];  Service: Gastroenterology;  Laterality: N/A;   FLEXIBLE SIGMOIDOSCOPY N/A 01/21/2022   Procedure: FLEXIBLE SIGMOIDOSCOPY;  Surgeon: Wilford Corner, MD;  Location: [facility];  Service: Gastroenterology;  Laterality: N/A;   FOOT SURGERY     toenail   INTRAUTERINE DEVICE INSERTION  08/07/2017   Mirena   IR EXCHANGE BILIARY DRAIN  02/11/2022   IR RADIOLOGIST EVAL & MGMT  02/08/2022   IR RADIOLOGIST EVAL & MGMT  02/20/2022   removal of melanoma     REMOVAL OF  STONES  01/15/2022   Procedure: REMOVAL OF STONES;  Surgeon: Clarene Essex, MD;  Location: [facility];  Service: Gastroenterology;;   [name]  01/15/2022   Procedure: [name];  Surgeon: Clarene Essex, MD;  Location: [facility];  Service: Gastroenterology;;    Allergies: Cefepime, Contrast media [iodinated contrast media], Iodine, Other, Ampicillin, Avocado, Levofloxacin,  Pantoprazole, Septra [bactrim], Sulfamethoxazole-trimethoprim, Vancomycin, and Colestipol hcl  Medications: Prior to Admission medications   Medication Sig Start Date End Date Taking? Authorizing Provider  acetaminophen (TYLENOL) 500 MG tablet Take 1,000 mg by mouth every 6 (six) hours as needed for mild pain or fever.    [provider]  albuterol (PROAIR HFA) 108 (90 Base) MCG/ACT inhaler Inhale 2 puffs into the lungs every 4 (four) hours as needed for wheezing or shortness of breath (coughing fits). 05/23/21   Garnet Sierras, DO  amoxicillin-clavulanate (AUGMENTIN) 875-125 MG tablet Take 1 tablet by mouth every 12 (twelve) hours for 14 days. 02/21/22 03/07/22  Mignon Pine, DO  cholestyramine light (PREVALITE) 4 g packet Take 1 packet (4 g total) by mouth 2 (two) times daily. Patient taking differently: Take 4 g by mouth as needed. 01/26/22   Regalado, Belkys A, MD  dextromethorphan (DELSYM) 30 MG/5ML liquid Take 5 mLs (30 mg total) by mouth 2 (two) times daily. Patient taking differently: Take 30 mg by mouth as needed for cough. 02/02/22   Debbe Odea, MD  diclofenac Sodium (VOLTAREN) 1 % GEL Apply 4 g topically 4 (four) times daily. 02/02/22   Debbe Odea, MD  dicyclomine (BENTYL) 10 MG capsule Take 1 capsule (10 mg total) by mouth 3 (three) times daily as needed for spasms. 02/02/22 03/04/22  Debbe Odea, MD  diphenhydrAMINE (BENADRYL) 50 MG capsule Take one capsule 1 hour prior to scan. 02/08/22   Docia Barrier, PA  eletriptan (RELPAX) 40 MG tablet 40 mg every 2 (two) hours as needed for migraine. 05/18/20   [provider]  EPINEPHrine 0.3 mg/0.3 mL IJ SOAJ injection Inject 0.3 mg into the muscle as needed for anaphylaxis.    [provider]  famotidine (PEPCID) 20 MG tablet Take 20 mg by mouth daily as needed for heartburn or indigestion.    [provider]  fluconazole (DIFLUCAN) 200 MG tablet Take 2 tablets (400 mg total) by mouth daily for 14  days. 02/21/22 03/07/22  Mignon Pine, DO  fluticasone (FLONASE) 50 MCG/ACT nasal spray Place 2 sprays into both nostrils 2 (two) times daily as needed for allergies. 07/14/20   [provider]  ketotifen (ZADITOR) 0.025 % ophthalmic solution Place 1 drop into both eyes daily as needed (Itching eye and allergies).    [provider]  metoprolol tartrate (LOPRESSOR) 25 MG tablet Take 1 tablet (25 mg total) by mouth daily as needed (svt). 01/26/22   Regalado, Belkys A, MD  predniSONE (DELTASONE) 50 MG tablet Take one tablet 13 hours, 7 hours, and 1 hour prior to scan. 02/08/22   Docia Barrier, PA  Sodium Chloride Flush (NORMAL SALINE FLUSH) 0.9 % SOLN Flush right drain with 5 ml of sterile saline once daily 02/01/22     traMADol (ULTRAM) 50 MG tablet Take 1 tablet (50 mg total) by mouth every 6 (six) hours as needed for moderate pain. 02/02/22   Debbe Odea, MD     Family History  Problem Relation Age of Onset   Tremor Mother    Stroke Father    Autoimmune disease Father    CVA  Father    Breast cancer Sister 39   Arthritis Sister        Psoriatic   Thyroid disease Sister    Breast cancer Maternal Grandmother        Age 47   Thyroid disease Daughter        Hypo    Social History   Socioeconomic History   Marital status: Married    Spouse name: Not on file   Number of children: Not on file   Years of education: Not on file   Highest education level: Not on file  Occupational History   Not on file  Tobacco Use   Smoking status: Former   Smokeless tobacco: Never  Vaping Use   Vaping Use: Never used  Substance and Sexual Activity   Alcohol use: Not Currently    Comment: Rare   Drug use: No   Sexual activity: Yes    Birth control/protection: Post-menopausal    Comment:  declined sexual hx questions,des neg  Other Topics Concern   Not on file  Social History Narrative   Not on file   Social Determinants of Health   Financial Resource Strain:  Not on file  Food Insecurity: Not on file  Transportation Needs: Not on file  Physical Activity: Not on file  Stress: Not on file  Social Connections: Not on file       Review of Systems: A 12 point ROS discussed and pertinent positives are indicated in the HPI above.  All other systems are negative.  Review of Systems  Vital Signs: BP 98/64 (BP Location: Left Arm)   Pulse 82   SpO2 96%   Physical Exam General: 56 yo female appearing stated age.  Well-developed, well-nourished.  No distress, healthy appearing. HEENT: Atraumatic, normocephalic.  Conjugate gaze, extra-ocular motor intact. No scleral icterus or scleral injection. No lesions on external ears, nose, lips, or gums.  Oral mucosa moist, pink.  Neck: Symmetric with no goiter enlargement.  Chest/Lungs:  Symmetric chest with inspiration/expiration.  No labored breathing.    Heart:    No JVD appreciated.  Genito-urinary: Deferred Neurologic: Alert & Oriented to person, place, and time.   Normal affect and insight.  Appropriate questions.  Moving all 4 extremities with gross sensory intact.  Drain site: small scab at the skin site.  No erythema or concern for infection.   Mallampati Score:     Imaging: CT ABDOMEN PELVIS WO CONTRAST  Result Date: 02/20/2022 CLINICAL DATA:  One week follow-up perinephric abscess. Last drain interrogation 02/11/2022. EXAM: CT ABDOMEN AND PELVIS WITHOUT CONTRAST TECHNIQUE: Multidetector CT imaging of the abdomen and pelvis was performed following the standard protocol without IV contrast. RADIATION DOSE REDUCTION: This exam was performed according to the departmental dose-optimization program which includes automated exposure control, adjustment of the mA and/or kV according to patient size and/or use of iterative reconstruction technique. COMPARISON:  CT AP, 02/08/2022.  IR fluoroscopy, 02/11/2022. FINDINGS: Lower chest: Persistent small volume RIGHT pleural effusion with minimal adjacent  dependent atelectasis. Hepatobiliary: Trace pneumobilia as noted within the LEFT hepatic lobe. Persistent and likely secondary to prior sphincterotomy. Status post cholecystectomy. No biliary dilatation. Small volume residual hypodense perihepatic fluid collection, along the inferomedial margin of the RIGHT hepatic lobe, and measuring up to 5.0 x 1.6 x 1.4 cm. This is improved since 02/08/2022 comparison Interval repositioning of infrahepatic percutaneous drainage catheter, with pigtail just inferior to aforementioned residual collection. Pancreas: No pancreatic ductal dilatation or surrounding inflammatory changes. Spleen: Normal  in size without focal abnormality. Adrenals/Urinary Tract: Adrenal glands are unremarkable. Trace residual RIGHT perihepatic stranding. No CT evidence of residual RIGHT perinephric fluid collection. The LEFT kidney is normal. No renal calculi, focal lesion, or hydronephrosis. Bladder is unremarkable. Stomach/Bowel: Stomach is within normal limits. Appendix appears normal. No evidence of bowel wall thickening, distention, or inflammatory changes. Vascular/Lymphatic: No significant vascular findings are present. No enlarged abdominal or pelvic lymph nodes. Reproductive: Uterus and adnexa are unremarkable. Pelvic phleboliths. Other: No abdominal wall hernia or abnormality. No abdominopelvic ascites. Musculoskeletal: Incidental LEFT sacral Tarlov cyst. No acute osseous findings. No follow-up is indicated for incidental findings, unless specifically mentioned. IMPRESSION: Since CT AP dated 02/08/2022; 1. Repositioning with improved location of RIGHT infrahepatic / perinephric drainage catheter. 2. Trace volume of residual perihepatic fluid collection along the inferomedial margin of the RIGHT hepatic lobe. 3. No CT evidence of residual RIGHT perinephric fluid collection. 4. Trace residual RIGHT pleural effusion, likely reactive, and pneumobilia. Additional incidental, chronic and senescent  findings as above. Electronically Signed   By: Michaelle Birks M.D.   On: 02/20/2022 17:16   IR Radiologist Eval & Mgmt  Result Date: 02/20/2022 Please refer to notes tab for details about interventional procedure. (Op Note)  IR EXCHANGE BILIARY DRAIN  Result Date: 02/11/2022 INDICATION: 56 year old woman with prior history of perinephric abscess status post drain placement on 01/30/2022 presented to interventional radiology clinic with low-grade intermittent fever and increasing right flank pain. CT of the abdomen and pelvis on 02/08/2022 showed resolution of the Peri nephric component, however complex collection still remained between the liver and kidney. Patient presents to interventional Radiology for attempted repositioning of the Peri nephric drain/aspiration of remaining component between the liver and right kidney. EXAM: 1. Repositioning of right retroperitoneal drain. 2. Ultrasound-guided aspiration of right retroperitoneal residual fluid collection. MEDICATIONS: The patient is currently admitted to the hospital and receiving intravenous antibiotics. The antibiotics were administered within an appropriate time frame prior to the initiation of the procedure. ANESTHESIA/SEDATION: Fentanyl 25 mcg IV; Versed 0.5 mg IV Moderate Sedation Time:  26 minutes The patient was continuously monitored during the procedure by the interventional radiology nurse under my direct supervision. COMPLICATIONS: None immediate. PROCEDURE: Informed written consent was obtained from the patient after a thorough discussion of the procedural risks, benefits and alternatives. All questions were addressed. Maximal Sterile Barrier Technique was utilized including caps, mask, sterile gowns, sterile gloves, sterile drape, hand hygiene and skin antiseptic. A timeout was performed prior to the initiation of the procedure. Patient position prone on the fluoroscopy table. The external segment of the existing pigtail catheter and  surrounding skin prepped and draped in usual fashion. Contrast administered under fluoroscopy showed communication between the perinephric collection and the collection seen more superiorly adjacent to the liver. The existing drain was removed over 0.035 inch glidewire. Kumpe catheter and Glidewire were successfully navigated superiorly to the level of the residual fluid collection. 99 [place] biliary type drain was inserted to allow for drainage of the superior and inferior perinephric components. Contrast administered under fluoroscopy opacified both collections. The drain was secured to skin with suture and connected to bag. Sonographic evaluation of the right upper quadrant demonstrated a complex collection interposed between the right kidney and liver. Following local lidocaine administration, this area was accessed with 19 gauge Yueh needle under continuous ultrasound guidance, however no significant fluid could be aspirated. IMPRESSION: 1. Successful repositioning of right retroperitoneal drain with new 12 [place] biliary type catheter in place with pigtail  positioned in the superior perinephric region and additional sideholes positioned in the inferior perinephric region. 2. Residual collection between the liver and right kidney was percutaneously accessed utilizing ultrasound guidance, however no significant fluid could be aspirated. PLAN: 1. Return in 1 week to IR clinic for repeat CT and drain check. 2. Patient instructed to contact us earlier if she develops worsening pain or fever. Electronically Signed   By: Miachel Roux M.D.   On: 02/11/2022 17:50   CT ABDOMEN PELVIS WO CONTRAST  Result Date: 02/08/2022 CLINICAL DATA:  56 year old female with a complicated right perinephric abscess secondary to presumed microperforation sustained during prior ERCP for choledocholithiasis. EXAM: CT ABDOMEN AND PELVIS WITHOUT CONTRAST TECHNIQUE: Multidetector CT imaging of the abdomen and pelvis was performed  following the standard protocol without IV contrast. RADIATION DOSE REDUCTION: This exam was performed according to the departmental dose-optimization program which includes automated exposure control, adjustment of the mA and/or kV according to patient size and/or use of iterative reconstruction technique. COMPARISON:  MRI abdomen 01/28/2022; CT abdomen/pelvis 01/27/2022 FINDINGS: Lower chest: Small right pleural effusion, similar compared to prior. Associated right lower lobe atelectasis. Unremarkable appearance of the heart and distal esophagus. Hepatobiliary: Surgical changes of prior cholecystectomy. Pneumobilia is present consistent with patency of the biliary sphincter. Normal hepatic contour and morphology. No discrete hepatic lesion. No biliary ductal dilatation. Pancreas: Unremarkable. No pancreatic ductal dilatation or surrounding inflammatory changes. Spleen: Normal in size without focal abnormality. Adrenals/Urinary Tract: Unremarkable adrenal glands. The left kidney is normal in appearance. Irregular fluid collection remains present in the right superior perinephric space extending inferiorly along the lateral margin of the perinephric space to a percutaneous drainage catheter. The drainage catheter is well position. The inferior aspect of the fluid collection is significantly reduced in volume. However, the more superior aspect remains nearly unchanged. No new fluid collection identified. Ureters and bladder are unremarkable. Stomach/Bowel: Stomach is within normal limits. Appendix appears normal. No evidence of bowel wall thickening, distention, or inflammatory changes. Vascular/Lymphatic: Limited evaluation in the absence of intravenous contrast. No evidence of atherosclerotic plaque or suspicious adenopathy. Reproductive: Status post hysterectomy. No adnexal masses. Other: No abdominal wall hernia or abnormality. No abdominopelvic ascites. Musculoskeletal: No acute or significant osseous findings.  Expansile low-attenuation lesion present within the left sacral foramen has mildly enlarged compared to prior imaging from 2017. IMPRESSION: 1. Well-positioned right inferior perinephric drainage catheter with improvement in the amount of irregular perinephric fluid adjacent to the catheter. However, complex perinephric fluid persists more superiorly extending up into the apex of the right perinephric space just below the diaphragm. 2. Persistent and unchanged small right pleural effusion and associated right lower lobe atelectasis. 3. Progressive expansile lesion in the S2 neural foramen may represent a Tarlov cyst. 4. Additional ancillary findings as above. Electronically Signed   By: Jacqulynn Cadet M.D.   On: 02/08/2022 15:35   IR Radiologist Eval & Mgmt  Result Date: 02/08/2022 Please refer to notes tab for details about interventional procedure. (Op Note)  DG Chest 2 View  Result Date: 02/07/2022 CLINICAL DATA:  Right pleural effusion. EXAM: CHEST - 2 VIEW COMPARISON:  Jan 27, 2022. FINDINGS: The heart size and mediastinal contours are within normal limits. Left lung is clear. Mild right basilar subsegmental atelectasis is noted with small right pleural effusion. The visualized skeletal structures are unremarkable. IMPRESSION: Mild right basilar subsegmental atelectasis is noted with small right pleural effusion. Electronically Signed   By: Marijo Conception M.D.   On:  02/07/2022 12:44   [place] EKG SITE RITE  Result Date: 01/31/2022 If Site Rite image not attached, placement could not be confirmed due to current cardiac rhythm.  CT IMAGE GUIDED DRAINAGE BY PERCUTANEOUS CATHETER  Result Date: 01/30/2022 CLINICAL DATA:  Right upper quadrant abdominal pain. Right perinephric fluid collection on CT, 1st demonstrated 01/21/2022, new from previous CT 01/15/2022. Intra-abdominal abscess suspected following ERCP. Stable size of large complex right perinephric fluid collection on MR, with heterogeneous  peripheral enhancement following contrast, highly suspicious for an infected fluid collection. Differential considerations include infected urinoma and infected biloma given the patient's recent biliary intervention. EXAM: CT GUIDED DRAINAGE OF RETROPERITONEAL ABSCESS ANESTHESIA/SEDATION: Intravenous Fentanyl 53mg and Versed 0.564mwere administered as conscious sedation during continuous monitoring of the patient's level of consciousness and physiological / cardiorespiratory status by the radiology RN, with a total moderate sedation time of 18 minutes. PROCEDURE: The procedure, risks, benefits, and alternatives were explained to the patient. Questions regarding the procedure were encouraged and answered. The patient understands and consents to the procedure. Patient placed prone. Select axial scans through the mid abdomen were obtained. The right retroperitoneal collection was localized and an appropriate skin site was determined and marked. Under CT fluoroscopic guidance, 18 gauge trocar needle advanced into the collection. Purulent material could be aspirated. Amplatz wire advanced easily, position confirmed on CT. Tract dilated to facilitate placement 12 French pigtail drain catheter, formed centrally within the collection. Position confirmed on CT. Approximately 20 mL of purulent material were aspirated and sent for Gram stain and culture. Catheter secured externally with 0 Prolene suture and StatLock and placed to external gravity drain bag The patient tolerated the procedure well. RADIATION DOSE REDUCTION: This exam was performed according to the departmental dose-optimization program which includes automated exposure control, adjustment of the mA and/or kV according to patient size and/or use of iterative reconstruction technique. COMPLICATIONS: None immediate FINDINGS: Multiloculated right retroperitoneal collection was localized. 12 French pigtail drain catheter placed into the inferior dominant component.  20 mL purulent aspirate sent for Gram stain and culture. IMPRESSION: Technically successful CT-guided retroperitoneal abscess drain catheter placement. Electronically Signed   By: D Lucrezia Europe.D.   On: 01/30/2022 16:00    Labs:  CBC: Recent Labs    01/29/22 0401 01/30/22 0401 02/01/22 0327 02/15/22 1541  WBC 18.3* 13.6* 10.7* 9.4  HGB 11.0* 11.2* 10.8* 12.4  HCT 35.0* 34.3* 33.8* 37.1  PLT 518* 535* 541* 449*    COAGS: Recent Labs    01/29/22 1410  INR 1.1    BMP: Recent Labs    01/28/22 0343 01/29/22 0401 01/30/22 0401 02/01/22 0327 02/15/22 1541  NA 140 137 140 138 142  K 3.4* 3.6 3.8 3.9 4.2  CL 106 104 105 105 101  CO2 '26 23 27 26 29  ' GLUCOSE 102* 90 97 93 94  BUN '6 8 7 11 13  ' CALCIUM 8.3* 8.2* 8.3* 8.4* 9.5  CREATININE 0.46 0.55 0.60 0.53 0.64  GFRNONAA >60 >60 >60 >60  --     LIVER FUNCTION TESTS: Recent Labs    01/27/22 1834 01/28/22 0343 01/29/22 0401 01/30/22 0401 02/15/22 1541  BILITOT 0.5 0.5 0.5 0.5 0.3  AST 28 18 11* 11* 16  ALT '25 20 17 15 28  ' ALKPHOS 71 63 67 67  --   PROT 6.8 5.8* 5.9* 6.0* 6.4  ALBUMIN 3.1* 2.7* 2.7* 2.6*  --     TUMOR MARKERS: No results for input(s): "AFPTM", "CEA", "CA199", "CHROMGRNA" in the  last 8760 hours.  Assessment and Plan: 56 yo female with prior drainage of subhepatic abscess.   Drainage has been scant, with decreasing overall volume on serial CT scans, and she is feeling well.   We elected to remove the drain today and I discussed with her that there is no routine care and decisions are based on favorable trend of output, clinical signs, and the CT images.  There is always a small chance of recurrence.  She understands and would like to remove.   She is most concerned with who would be the doctors to help her if she feels she has recurrence.  I did let her know that while it might be reasonable for our team to order a CT and assess her if this were to happen in the next month or so, we would otherwise  encourage her to get worked up with her PCP as outpatient or ED/urgent care (if urgent/emergent), especially if any new symptoms were to occur months down the road from now. She understands.   The drain was removed in the clinic today.   Electronically Signed: Corrie Mckusick 02/28/2022, 10:59 AM   I spent a total of    25 Minutes in face to face in clinical consultation, greater than 50% of which was counseling/coordinating care for subhepatic abscess, drainage

## 2022-03-03 DIAGNOSIS — N151 Renal and perinephric abscess: Secondary | ICD-10-CM | POA: Insufficient documentation

## 2022-03-03 NOTE — Assessment & Plan Note (Addendum)
-   Secondary to diaphragmatic irritation. Recommend she resume dextromethorphan '30mg'$ /15m q 12 hours for cough suppressant

## 2022-03-03 NOTE — Assessment & Plan Note (Addendum)
-   Hospitalized from 4/25-5/6 for fever and abdominal pain. Patient underwent ERCP which was complicated by micro perforation and fluid collection in gallbladder fossa. CT abdomen 01/27/22 showed fluid collection in right subhepatic perinephritic area which was essentially unchanged from previous bilateral small pleural effusions. Started on broad spectrum IV antibiotics. Underwent CT guided right retroperitoneal drain placement. Culture grew out enterococcus and yeast. Patient transitioned to Augmentin and Difucan. She continues to have some pain on her rigiht side and dry cough. Perc drain draining 10-13m cloud white fluid. Scheduled for repeat CT/abdomen pelvis 5/19 with IR.

## 2022-03-03 NOTE — Assessment & Plan Note (Addendum)
-    Patient underwent thoracentesis on 5/3 with 320m removed, consistent with exudative effusion. Pleural effusion felt to be reactive due to abdominal process.  Repeat CXR today.

## 2022-03-04 ENCOUNTER — Ambulatory Visit (INDEPENDENT_AMBULATORY_CARE_PROVIDER_SITE_OTHER): Payer: 59

## 2022-03-04 ENCOUNTER — Encounter: Payer: Self-pay | Admitting: Infectious Diseases

## 2022-03-04 ENCOUNTER — Other Ambulatory Visit: Payer: Self-pay | Admitting: Obstetrics and Gynecology

## 2022-03-04 DIAGNOSIS — J90 Pleural effusion, not elsewhere classified: Secondary | ICD-10-CM

## 2022-03-04 DIAGNOSIS — Z1231 Encounter for screening mammogram for malignant neoplasm of breast: Secondary | ICD-10-CM

## 2022-03-05 ENCOUNTER — Telehealth (INDEPENDENT_AMBULATORY_CARE_PROVIDER_SITE_OTHER): Payer: 59 | Admitting: Internal Medicine

## 2022-03-05 ENCOUNTER — Other Ambulatory Visit: Payer: Self-pay

## 2022-03-05 ENCOUNTER — Encounter: Payer: Self-pay | Admitting: Internal Medicine

## 2022-03-05 DIAGNOSIS — K651 Peritoneal abscess: Secondary | ICD-10-CM | POA: Diagnosis not present

## 2022-03-05 DIAGNOSIS — J90 Pleural effusion, not elsewhere classified: Secondary | ICD-10-CM

## 2022-03-05 NOTE — Progress Notes (Signed)
[facility]  CHIEF COMPLAINT:    Follow up for intra abdominal abscess  SUBJECTIVE:    Laurie Terry is a 56 y.o. female with PMHx as below who presents to the clinic for intra abdominal abscess as a virtual visit.   For detailed discussion of her recent clinical history, please see my note from 02/15/22.  Since that visit, patient has continued to take Augmentin and Fluconazole.  She had follow up with IR on 5/31 and started a "capping trial" of her abscess drain.  She then most recently saw IR on 02/28/22.  At that visit, she reported on ~6/6 she had a temperature of 99 and generally not well so she uncapped the drain and attached gravity bag.  This resolved her symptoms.  She had about 1-2 cc in the bag with this uncapping.  She had repeat CT scan on 6/8 that showed slightly smaller residual fluid at the drain site and near complete resolution of fluid.  There was no worsening.    Additionally, her right pleural effusion was resolved on the CT.  Her drain was removed that day on 6/8 and since then she has been doing okay.  She reports abdominal pain as about a 2 right now at  the drain site.  It is a little worse with deeper breaths. She still has some upset stomach likely due to the antibiotics.  Her temperatures have been back down to her normal.  She still reports a slight dry cough.   Please see A&P for the details of today's visit and status of the patient's medical problems.   Patient's Medications  New Prescriptions   No medications on file  Previous Medications   EPINEPHRINE 0.3 MG/0.3 ML IJ SOAJ INJECTION    Inject 0.3 mg into the muscle as needed for anaphylaxis.   FAMOTIDINE (PEPCID) 20 MG TABLET    Take 20 mg by mouth daily as needed for heartburn or indigestion.   FLUTICASONE (FLONASE) 50 MCG/ACT NASAL SPRAY    Place 2 sprays into both nostrils 2 (two) times daily as needed for allergies.   METOPROLOL TARTRATE (LOPRESSOR) 25 MG  TABLET    Take 1 tablet (25 mg total) by mouth daily as needed (svt).  Modified Medications   No medications on file  Discontinued Medications   ACETAMINOPHEN (TYLENOL) 500 MG TABLET    Take 1,000 mg by mouth every 6 (six) hours as needed for mild pain or fever.   ALBUTEROL (PROAIR HFA) 108 (90 BASE) MCG/ACT INHALER    Inhale 2 puffs into the lungs every 4 (four) hours as needed for wheezing or shortness of breath (coughing fits).   AMOXICILLIN-CLAVULANATE (AUGMENTIN) 875-125 MG TABLET    Take 1 tablet by mouth every 12 (twelve) hours for 14 days.   CHOLESTYRAMINE LIGHT (PREVALITE) 4 G PACKET    Take 1 packet (4 g total) by mouth 2 (two) times daily.   DEXTROMETHORPHAN (DELSYM) 30 MG/5ML LIQUID    Take 5 mLs (30 mg total) by mouth 2 (two) times daily.   DICLOFENAC SODIUM (VOLTAREN) 1 % GEL    Apply 4 g topically 4 (four) times daily.   DICYCLOMINE (BENTYL) 10 MG CAPSULE    Take 1 capsule (10 mg total) by mouth 3 (three) times daily as needed for spasms.   DIPHENHYDRAMINE (BENADRYL) 50 MG CAPSULE    Take one capsule 1 hour prior to scan.   ELETRIPTAN (RELPAX) 40 MG TABLET  40 mg every 2 (two) hours as needed for migraine.   FLUCONAZOLE (DIFLUCAN) 200 MG TABLET    Take 2 tablets (400 mg total) by mouth daily for 14 days.   KETOTIFEN (ZADITOR) 0.025 % OPHTHALMIC SOLUTION    Place 1 drop into both eyes daily as needed (Itching eye and allergies).   PREDNISONE (DELTASONE) 50 MG TABLET    Take one tablet 13 hours, 7 hours, and 1 hour prior to scan.   SODIUM CHLORIDE FLUSH (NORMAL SALINE FLUSH) 0.9 % SOLN    Flush right drain with 5 ml of sterile saline once daily   TRAMADOL (ULTRAM) 50 MG TABLET    Take 1 tablet (50 mg total) by mouth every 6 (six) hours as needed for moderate pain.      Past Medical History:  Diagnosis Date   Allergic rhinitis    Aortic aneurysm ([place]) 09/23/2020   Cancer ([place])    Melanoma   Cervical dysplasia 2007   low-grade SIL, normal Pap smears since then   Family  history of adverse reaction to anesthesia    mom has h/o post op nausea/vomiting   Intermittent palpitations    IUD    inserted 03/2002.04/06/2007, 06/03/2012   Ocular migraine    Osteomalacia    Pancreatitis    Seafood allergy     Social History   Tobacco Use   Smoking status: Former   Smokeless tobacco: Never  Scientific laboratory technician Use: Never used  Substance Use Topics   Alcohol use: Not Currently    Comment: Rare   Drug use: No    Family History  Problem Relation Age of Onset   Tremor Mother    Stroke Father    Autoimmune disease Father    CVA Father    Breast cancer Sister 54   Arthritis Sister        Psoriatic   Thyroid disease Sister    Breast cancer Maternal Grandmother        Age 64   Thyroid disease Daughter        Hypo    Allergies  Allergen Reactions   Cefepime Cough    Patient develops cough, rash in her ears.    Contrast Media [Iodinated Contrast Media] Shortness Of Breath    Low blood pressure    Iodine Shortness Of Breath and Swelling   Other Hives and Swelling    Seafood   Ampicillin Hives    Childhood allergy   Avocado Itching    Tongue and mouth   Levofloxacin Hives    Pt already has aortic aneurysm.   Pantoprazole Other (See Comments)    Joint aches, fever, peeling of skin   Septra [Bactrim] Hives   Sulfamethoxazole-Trimethoprim Other (See Comments) and Hives   Vancomycin Cough    Patient develops cough, rash ears, tingling lips after receiving Cefepime and vancomycin 01/23/2022   Colestipol Hcl Rash    Review of Systems  All other systems reviewed and are negative.  Except as noted above.   OBJECTIVE:     Physical Exam Constitutional:      Comments: She is seen on the video and appears very well this morning.       Labs and Microbiology:    Latest Ref Rng & Units 02/15/2022    3:41 PM 02/01/2022    3:27 AM 01/30/2022    4:01 AM  CBC  WBC 3.8 - 10.8 Thousand/uL 9.4  10.7  13.6   Hemoglobin 11.7 - 15.5  g/dL 12.4  10.8   11.2   Hematocrit 35.0 - 45.0 % 37.1  33.8  34.3   Platelets 140 - 400 Thousand/uL 449  541  535       Latest Ref Rng & Units 02/15/2022    3:41 PM 02/01/2022    3:27 AM 01/30/2022    4:01 AM  CMP  Glucose 65 - 99 mg/dL 94  93  97   BUN 7 - 25 mg/dL '13  11  7   '$ Creatinine 0.50 - 1.03 mg/dL 0.64  0.53  0.60   Sodium 135 - 146 mmol/L 142  138  140   Potassium 3.5 - 5.3 mmol/L 4.2  3.9  3.8   Chloride 98 - 110 mmol/L 101  105  105   CO2 20 - 32 mmol/L '29  26  27   '$ Calcium 8.6 - 10.4 mg/dL 9.5  8.4  8.3   Total Protein 6.1 - 8.1 g/dL 6.4   6.0   Total Bilirubin 0.2 - 1.2 mg/dL 0.3   0.5   Alkaline Phos 38 - 126 U/L   67   AST 10 - 35 U/L 16   11   ALT 6 - 29 U/L 28   15      No results found for this or any previous visit (from the past 240 hour(s)).  Imaging: CT scan abd/pelvis 6/8 IMPRESSION: Unchanged position of pigtail drain in the subhepatic space, with near complete resolution of the nonspecific fluid/tissue.   Interval resolution of right-sided pleural fluid.   ASSESSMENT & PLAN:    Iatrogenic subdiaphragmatic abscess ([place]) She has continued to improve from her prolonged complication following ERCP in April.  Today, she continues on Augmentin and Fluconazole following IR drain removal on 02/28/22.  Will have her stop antibiotics on 03/07/22 (1 week of therapy following drain removal).  Return precautions discussed and RTC as needed.  Pleural effusion on right This has resolved on CT abd/pelvis from 6/8 and CXR yesterday.  CXR did note possible atelectasis minimally in the right base.  Recommended continued incentive spirometry and she will also see pulmonary on 04/12/22 for follow up.    Raynelle High[facility] 03/05/2022, 9:18 AM  Virtual Visit via Video Note   I connected with Laurie Terry on 03/05/22 at 9:18 AM by videoand verified that I am speaking with the correct person using two identifiers.    I discussed the limitations, risks, security and privacy concerns of performing an evaluation and management service by telephone and the availability of in person appointments. I also discussed with the patient that there may be a patient responsible charge related to this service. The patient expressed understanding and agreed to proceed.  Patient location: home My location: rcid Duration of call: 10 min

## 2022-03-05 NOTE — Assessment & Plan Note (Signed)
She has continued to improve from her prolonged complication following ERCP in April.  Today, she continues on Augmentin and Fluconazole following IR drain removal on 02/28/22.  Will have her stop antibiotics on 03/07/22 (1 week of therapy following drain removal).  Return precautions discussed and RTC as needed.

## 2022-03-05 NOTE — Assessment & Plan Note (Signed)
This has resolved on CT abd/pelvis from 6/8 and CXR yesterday.  CXR did note possible atelectasis minimally in the right base.  Recommended continued incentive spirometry and she will also see pulmonary on 04/12/22 for follow up.

## 2022-03-05 NOTE — Progress Notes (Signed)
Please let patient know there is no further pleural effusion noted on CXR. Minimal atelectasis or infiltrate left lung base. If not having any fever or purulent sputum no indication for further abx. Encourage deep breathing. FU with GI and IR as indicated. Keep apt in July with MR

## 2022-03-06 ENCOUNTER — Encounter: Payer: Self-pay | Admitting: Internal Medicine

## 2022-03-08 NOTE — Progress Notes (Signed)
That's likely atelectasis, when the airsacs are deflated. Needs to practice deep breathing exercises 10x/hour

## 2022-03-09 LAB — ACID FAST CULTURE WITH REFLEXED SENSITIVITIES (MYCOBACTERIA): Acid Fast Culture: NEGATIVE

## 2022-03-22 ENCOUNTER — Other Ambulatory Visit: Payer: Self-pay | Admitting: Gastroenterology

## 2022-03-22 DIAGNOSIS — R1011 Right upper quadrant pain: Secondary | ICD-10-CM

## 2022-03-28 ENCOUNTER — Ambulatory Visit
Admission: RE | Admit: 2022-03-28 | Discharge: 2022-03-28 | Disposition: A | Discharge status: Home / Self Care | Payer: 59 | Source: Ambulatory Visit | Attending: Gastroenterology | Admitting: Gastroenterology

## 2022-03-28 DIAGNOSIS — R1011 Right upper quadrant pain: Secondary | ICD-10-CM

## 2022-03-29 ENCOUNTER — Other Ambulatory Visit: Payer: Self-pay

## 2022-03-29 ENCOUNTER — Emergency Department (HOSPITAL_BASED_OUTPATIENT_CLINIC_OR_DEPARTMENT_OTHER): Payer: 59

## 2022-03-29 ENCOUNTER — Emergency Department (HOSPITAL_BASED_OUTPATIENT_CLINIC_OR_DEPARTMENT_OTHER)
Admission: EM | Admit: 2022-03-29 | Discharge: 2022-03-29 | Disposition: A | Discharge status: Home / Self Care | Payer: 59 | Attending: Emergency Medicine | Admitting: Emergency Medicine

## 2022-03-29 ENCOUNTER — Encounter (HOSPITAL_BASED_OUTPATIENT_CLINIC_OR_DEPARTMENT_OTHER): Payer: Self-pay

## 2022-03-29 DIAGNOSIS — R1010 Upper abdominal pain, unspecified: Secondary | ICD-10-CM

## 2022-03-29 DIAGNOSIS — R1011 Right upper quadrant pain: Secondary | ICD-10-CM | POA: Diagnosis present

## 2022-03-29 DIAGNOSIS — R509 Fever, unspecified: Secondary | ICD-10-CM | POA: Diagnosis not present

## 2022-03-29 DIAGNOSIS — R110 Nausea: Secondary | ICD-10-CM | POA: Diagnosis not present

## 2022-03-29 LAB — COMPREHENSIVE METABOLIC PANEL
ALT: 12 U/L (ref 0–44)
AST: 14 U/L — ABNORMAL LOW (ref 15–41)
Albumin: 4.5 g/dL (ref 3.5–5.0)
Alkaline Phosphatase: 65 U/L (ref 38–126)
Anion gap: 9 (ref 5–15)
BUN: 14 mg/dL (ref 6–20)
CO2: 30 mmol/L (ref 22–32)
Calcium: 9.7 mg/dL (ref 8.9–10.3)
Chloride: 103 mmol/L (ref 98–111)
Creatinine, Ser: 0.65 mg/dL (ref 0.44–1.00)
GFR, Estimated: >60 mL/min (ref >60)
Glucose, Bld: 93 mg/dL (ref 70–99)
Potassium: 3.8 mmol/L (ref 3.5–5.1)
Sodium: 142 mmol/L (ref 135–145)
Total Bilirubin: 0.3 mg/dL (ref 0.3–1.2)
Total Protein: 6.9 g/dL (ref 6.5–8.1)

## 2022-03-29 LAB — CBC
HCT: 40 % (ref 36.0–46.0)
Hemoglobin: 12.8 g/dL (ref 12.0–15.0)
MCH: 28.6 pg (ref 26.0–34.0)
MCHC: 32 g/dL (ref 30.0–36.0)
MCV: 89.3 fL (ref 80.0–100.0)
Platelets: 277 10*3/uL (ref 150–400)
RBC: 4.48 MIL/uL (ref 3.87–5.11)
RDW: 15.4 % (ref 11.5–15.5)
WBC: 6.8 10*3/uL (ref 4.0–10.5)
nRBC: 0 % (ref 0.0–0.2)

## 2022-03-29 LAB — LIPASE, BLOOD: Lipase: 21 U/L (ref 11–51)

## 2022-03-29 NOTE — ED Provider Notes (Cosign Needed Addendum)
[facility] EMERGENCY DEPT Provider Note   CSN: [id number] Arrival date & time: 03/29/22  1720     History  Chief Complaint  Patient presents with   Abdominal Pain    Laurie Terry is a 56 y.o. female with history of thoracic aortic aneurysm, gallstones s/p cholecystectomy in 2017, pancreatitis, s/p ERCP by Dr. Watt Climes on 01/15/22 that presents with 2 days of worsening abdominal pain.  Patient describes abdominal pain in 2 separate areas.  She states that she has right upper quadrant abdominal pain that is constant, 3/10 in intensity, does not radiate, and increasing in frequency.  She is also complaining of substernal abdominal pain that is intermittent, 7 out of 10 in intensity, radiates to her back, and increasing in frequency.  The patient states that she also develops nausea and diaphoresis during the episodes of pain.  She states that she developed a fever and chills today.  She states that she has tried using the dicyclomine she was prescribed as well as an ice pack without relief in her symptoms.  The patient's postoperative course following her ERCP was complicated by intra-abdominal abscess requiring antibiotics and drainage as well as a right-sided pleural effusion.  The patient states that the drain for her abscess was removed several weeks ago.  Her chart states that her antibiotics were stopped on 03/07/2022.  She states that she was feeling better overall following the removal of her drain and after her last course of antibiotics had finished but that her symptoms began worsening this week.  She states that she received an ultrasound at her GI doctor visit last week but was not yet updated on her results. She called her GI doctor's office today about her symptoms and was instructed to visit the ED.   Patient denies recent trauma or injury, shortness of breath, chest pain, swelling, vomiting, diarrhea, decreased appetite.  Patient states that she has an allergy to IV  contrast.  The history is provided by the patient.  Abdominal Pain Associated symptoms: chills, fever and nausea   Associated symptoms: no chest pain, no diarrhea, no shortness of breath and no vomiting        Home Medications Prior to Admission medications   Medication Sig Start Date End Date Taking? Authorizing Provider  EPINEPHrine 0.3 mg/0.3 mL IJ SOAJ injection Inject 0.3 mg into the muscle as needed for anaphylaxis. Patient not taking: Reported on 03/05/2022    [provider]  famotidine (PEPCID) 20 MG tablet Take 20 mg by mouth daily as needed for heartburn or indigestion.    [provider]  fluticasone (FLONASE) 50 MCG/ACT nasal spray Place 2 sprays into both nostrils 2 (two) times daily as needed for allergies. 07/14/20   [provider]  metoprolol tartrate (LOPRESSOR) 25 MG tablet Take 1 tablet (25 mg total) by mouth daily as needed (svt). Patient not taking: Reported on 03/05/2022 01/26/22   Niel Hummer A, MD      Allergies    Cefepime, Contrast media [iodinated contrast media], Iodine, Other, Ampicillin, Avocado, Levaquin [levofloxacin], Levofloxacin, Pantoprazole, Septra [bactrim], Sulfamethoxazole-trimethoprim, Vancomycin, and Colestipol hcl    Review of Systems   Review of Systems  Constitutional:  Positive for chills, diaphoresis and fever. Negative for appetite change.  Respiratory:  Negative for shortness of breath.   Cardiovascular:  Negative for chest pain and leg swelling.  Gastrointestinal:  Positive for abdominal pain and nausea. Negative for diarrhea and vomiting.    Physical Exam Updated Vital Signs BP  106/65   Pulse 63   Temp 98.7 F (37.1 C)   Resp 12   Ht '5\' 5"'$  (1.651 m)   Wt 60.8 kg   SpO2 100%   BMI 22.30 kg/m  Physical Exam Constitutional:      General: She is not in acute distress.    Appearance: She is not ill-appearing.  Cardiovascular:     Rate and Rhythm: Normal rate and regular rhythm.     Heart  sounds: No murmur heard.    No friction rub. No gallop.  Pulmonary:     Effort: Pulmonary effort is normal.     Breath sounds: Normal breath sounds. No wheezing or rales.  Abdominal:     General: Abdomen is flat. Bowel sounds are normal. There is no distension.     Comments: Tenderness to palpation of the right upper quadrant in the subcostal region.  No tenderness to palpation of the substernal abdominal region.   Skin:    General: Skin is warm and dry.     Comments: Healed incision from prior drainage tube on the right flank.   Neurological:     Mental Status: She is alert.     ED Results / Procedures / Treatments   Labs (all labs ordered are listed, but only abnormal results are displayed) Labs Reviewed  COMPREHENSIVE METABOLIC PANEL - Abnormal; Notable for the following components:      Result Value   AST 14 (*)    All other components within normal limits  LIPASE, BLOOD  CBC  URINALYSIS, ROUTINE W REFLEX MICROSCOPIC  PREGNANCY, URINE    EKG None  Radiology US Abdomen Limited RUQ (LIVER/GB)  Result Date: 03/28/2022 CLINICAL DATA:  A 56 year old female presents with pain at drainage site following prior cholecystectomy. Drain reportedly now removed. EXAM: ULTRASOUND ABDOMEN LIMITED RIGHT UPPER QUADRANT COMPARISON:  02/28/2022 CT evaluation. FINDINGS: Gallbladder: Post cholecystectomy. Common bile duct: Diameter: 3.2 mm No intrahepatic biliary duct distension but with scattered echogenic foci in the liver favored to be pneumobilia following recent biliary intervention. Liver: Coarse hepatic echotexture. No visible lesion. Portal vein is patent on color Doppler imaging with normal direction of blood flow towards the liver. Other: Area measuring 1.1 x 1.6 x 2.2 cm adjacent to the portal vein at the hepatic hilum. No visible color Doppler flow. Nearly anechoic. Adjacent dirty shadowing from the duodenal bulb. IMPRESSION: Post cholecystectomy without biliary duct dilation and with  signs of pneumobilia. Cystic structure near the porta hepatis without color Doppler flow and without change from imaging performed on November 28, 2021. This may represent a small seroma or biloma. This could also represent a cyst extending into the porta hepatis from the hepatic parenchyma. No new fluid collection or signs of biliary obstruction. Electronically Signed   By: Zetta Bills M.D.   On: 03/28/2022 10:51    Procedures Procedures    Medications Ordered in ED Medications - No data to display  ED Course/ Medical Decision Making/ A&P                           Medical Decision Making Laurie Terry is a 56 y.o. female with history of thoracic aortic aneurysm, gallstones s/p cholecystectomy in 2017, pancreatitis, s/p ERCP by Dr. Watt Climes on 01/15/22 that presents with 2 days of worsening abdominal pain.  The patient is not ill-appearing.  Upon review of the patient's chart, her most recent ultrasound that resulted yesterday demonstrated no biliary duct  dilation and with signs of pneumobilia, unchanged cystic structure near the porta hepatis that may represent small seroma, biloma, or cyst, and no new fluid collections or signs of biliary obstruction. Differential diagnosis includes but is not limited to pancreatitis or recurrent abscess.  Will order CT of the abdomen and pelvis without contrast to assess for potential pancreatitis or signs of worsening abscess.  Will order lipase to assess for potential pancreatitis. Will order EKG given location of patients substernal abdominal pain to rule out potential cardiac causes. Will order CBC and CMP to assess the patient's cell counts, renal function, and liver function. Patient discussed with Dr. Roslynn Amble.  Amount and/or Complexity of Data Reviewed Labs: ordered. Decision-making details documented in ED Course. Radiology: ordered and independent interpretation performed. Decision-making details documented in ED Course.  10:02 PM  The patient CT  demonstrates no acute process and stable pneumobilia status post cholecystectomy. The patient's CBC, CMP, and lipase are within normal limits. Patients EKG is normal. Dr. Roslynn Amble updated the patient on the results of her labwork, imaging, and EKG. Dr. Roslynn Amble discussed with the patient plan to discharge and follow-up with Dr. Watt Climes (GI). Dr. Roslynn Amble discussed strict ED return precautions if the patient develops new or worsening symptoms including but not limited to abdominal pain, nausea, vomiting, chest pain. Patient agrees with the plan.        Final Clinical Impression(s) / ED Diagnoses Final diagnoses:  None    Rx / DC Orders ED Discharge Orders     None         Alfreda Hammad, Claudia Desanctis, MD 03/29/22 2252    Starlyn Skeans, MD 03/29/22 2257    Lucrezia Starch, MD 03/30/22 1902

## 2022-03-29 NOTE — ED Triage Notes (Signed)
Recent ERCP and peritoneal abcess, Pt endorses substernal chest pain and abd pain.

## 2022-03-29 NOTE — Discharge Instructions (Signed)
You have been diagnosed with upper abdominal pain.  Please follow-up with Dr. Watt Climes about your symptoms.  Please return to the ED if you develop new or worsening symptoms including but not limited to abdominal pain, nausea, vomiting, chest pain.

## 2022-03-29 NOTE — ED Notes (Signed)
Patient transported to CT 

## 2022-04-12 ENCOUNTER — Ambulatory Visit: Payer: 59 | Admitting: Internal Medicine

## 2022-04-18 ENCOUNTER — Ambulatory Visit
Admission: RE | Admit: 2022-04-18 | Discharge: 2022-04-18 | Disposition: A | Discharge status: Home / Self Care | Payer: 59 | Source: Ambulatory Visit | Attending: Obstetrics and Gynecology | Admitting: Obstetrics and Gynecology

## 2022-04-18 DIAGNOSIS — Z1231 Encounter for screening mammogram for malignant neoplasm of breast: Secondary | ICD-10-CM

## 2022-05-10 ENCOUNTER — Ambulatory Visit (INDEPENDENT_AMBULATORY_CARE_PROVIDER_SITE_OTHER): Payer: 59 | Admitting: Internal Medicine

## 2022-05-10 ENCOUNTER — Encounter: Payer: Self-pay | Admitting: Internal Medicine

## 2022-05-10 VITALS — BP 106/64 | HR 70 | Ht 65.0 in | Wt 141.4 lb

## 2022-05-10 DIAGNOSIS — J90 Pleural effusion, not elsewhere classified: Secondary | ICD-10-CM

## 2022-05-10 NOTE — Patient Instructions (Signed)
ICD-10-CM   1. Pleural effusion, bilateral  J90       Resovled  Plan As needed followup

## 2022-05-10 NOTE — Progress Notes (Signed)
02/07/2022    former smoker. PMH significant for bilateral pleural effusion, lap cholecystectomy, pancreatitis due to biliary obstruction, paroxysmal SVT, thoracic aortic aneurysm without rupture.   Patient presents today for hospital follow-up.  She was originally hospitalized from April 25 through May 6 for fever and abdominal pain.  At that time she underwent ERCP which was complicated by micro perforation and fluid collection in the gallbladder fossa.  She was also noted to have reactive pleural effusion.  She had right thoracentesis with 361m removed, consistent with exudative effusion. Patient was treated with antibiotics and discharged home on 5/6 on Augmentin.  She presented back to the emergency room on 5/7 and white count was noted to be 19.7.  CT of abdomen and pelvis showed fluid collection in right subhepatic perinephritic area which was essentially unchanged from previous, small bilateral pleural effusions,punctate nonobstructing right upper pole stone.  Patient was admitted and started on broad-spectrum IV antibiotics.  GI was consulted and underwent CT-guided right retroperitoneal drain placement.  Culture from abscess grew out Enterococcus and yeast, patient was transitioned to Augmentin and Diflucan.  She was noted to have a dry cough felt to be secondary to diaphragmatic irritation.  Pleural effusion felt to be reactive due to abdominal process.  She still has dry cough which causes some pain on her right side. She has not been taking cough suppressant. Perc drain draining 10-117mcloudy white fluid. Temp <99. She is scheduled for CT abdomen/pelvis tomorrow along with apt with IR.        OV 05/10/2022  Subjective:  Patient ID: Laurie Arabiafemale , DOB: 5/June 09, 1966 age 56.o. , MRN: [id number] AD[address]: [id number] McCari CarawayMD Patient Care Team: McCari CarawayMD as PCP - General (Family Medicine) WaRolm BookbinderMD  as Consulting Physician (General Surgery)  This Provider for this visit: Treatment Team:  Attending Provider: RaBrand MalesMD    05/10/2022 -   Chief Complaint  Patient presents with   Follow-up    Pt states she has been doing okay since last visit.     HPI Laurie Terry [age].o. -returns for follow-up.  Since I saw her last in the hospital and her following up with nurse practitioner her pleural effusions have resolved on CT scan.  In addition she also feels well she is asymptomatic from a respiratory standpoint.  Today in the exam room CMA noted differential blood pressures.  I checked the blood pressure on bilateral forearm with radial pulse.  I would say the systolic is around 10778ystolic blood pressure equal on both sides.  She does not have any chest pain that is active or diaphoresis.  She feels pleural effusion issues and GI issues are all resolved and she is open to the idea of expectant follow-up.    CT Chest data  No results found.    PFT      No data to display             has a past medical history of Allergic rhinitis, [place] [facility] Cervical dysplasia (2007), Family history of adverse reaction to anesthesia, Intermittent palpitations, IUD, Ocular migraine, Osteomalacia, Pancreatitis, and Seafood allergy.   reports that she has quit smoking. She has never used smokeless tobacco.  Past Surgical History:  Procedure Laterality Date   BIOPSY  01/21/2022   Procedure: BIOPSY;  Surgeon: ScWilford CornerMD;  Location: [facility];  Service: Gastroenterology;;  BUNIONECTOMY  2018   CHOLECYSTECTOMY N/A 04/09/2016   Procedure: LAPAROSCOPIC CHOLECYSTECTOMY ;  Surgeon: Rolm Bookbinder, MD;  Location: [place];  Service: General;  Laterality: N/A;   ERCP  04/11/2016   ERCP with pancreatic stent and failed CBD stent after needle knife and conventional sphincterotomy   ERCP N/A 04/12/2016   Procedure: ENDOSCOPIC RETROGRADE  CHOLANGIOPANCREATOGRAPHY (ERCP);  Surgeon: Clarene Essex, MD;  [facility] END[facility]COPY;  Service: Endoscopy;  Laterality: N/A;   ERCP N/A 01/15/2022   Procedure: ENDOSCOPIC RETROGRADE CHOLANGIOPANCREATOGRAPHY (ERCP);  Surgeon: Clarene Essex, MD;  Location: [facility];  Service: Gastroenterology;  Laterality: N/A;   FLEXIBLE SIGMOIDOSCOPY N/A 01/21/2022   Procedure: FLEXIBLE SIGMOIDOSCOPY;  Surgeon: Wilford Corner, MD;  Location: [facility];  Service: Gastroenterology;  Laterality: N/A;   FOOT SURGERY     toenail   INTRAUTERINE DEVICE INSERTION  08/07/2017   Mirena   IR EXCHANGE BILIARY DRAIN  02/11/2022   IR RADIOLOGIST EVAL & MGMT  02/08/2022   IR RADIOLOGIST EVAL & MGMT  02/20/2022   IR RADIOLOGIST EVAL & MGMT  02/28/2022   removal of melanoma     REMOVAL OF STONES  01/15/2022   Procedure: REMOVAL OF STONES;  Surgeon: Clarene Essex, MD;  Location: [facility];  Service: Gastroenterology;;   [name]  01/15/2022   Procedure: [name];  Surgeon: Clarene Essex, MD;  Location: [facility];  Service: Gastroenterology;;    Allergies  Allergen Reactions   Cefepime Cough    Patient develops cough, rash in her ears.    Contrast Media [Iodinated Contrast Media] Shortness Of Breath    Low blood pressure    Iodine Shortness Of Breath and Swelling   Other Hives and Swelling    Seafood   Ampicillin Hives    Childhood allergy   Avocado Itching    Tongue and mouth   Levaquin [Levofloxacin]     Not recommended in Aortic aneurysm patients.   Levofloxacin Hives    Pt already has aortic aneurysm.   Pantoprazole Other (See Comments)    Joint aches, fever, peeling of skin   Septra [Bactrim] Hives   Sulfamethoxazole-Trimethoprim Other (See Comments) and Hives   Vancomycin Cough    Patient develops cough, rash ears, tingling lips after receiving Cefepime and vancomycin 01/23/2022   Colestipol Hcl Rash    Immunization History  Administered Date(s) Administered   Hepatitis A, Adult  11/24/2015, 05/31/2016   Influenza,inj,Quad PF,6+ Mos 06/21/2013, 07/01/2016, 06/08/2017, 06/22/2020   Influenza-Unspecified 06/24/2015, 07/15/2019   PFIZER(Purple Top)SARS-COV-2 Vaccination 12/04/2019, 12/25/2019, 08/11/2020, 11/20/2020, 05/30/2021   Td 11/24/2015   Tdap 01/03/2006, 11/24/2015   Typhoid Inactivated 11/24/2015   Zoster, Live 05/18/2020, 10/20/2020    Family History  Problem Relation Age of Onset   Tremor Mother    Stroke Father    Autoimmune disease Father    CVA Father    Breast cancer Sister 75   Arthritis Sister        Psoriatic   Thyroid disease Sister    Breast cancer Maternal Grandmother        Age 46   Thyroid disease Daughter        Hypo     Current Outpatient Medications:    EPINEPHrine 0.3 mg/0.3 mL IJ SOAJ injection, Inject 0.3 mg into the muscle as needed for anaphylaxis., Disp: , Rfl:    famotidine (PEPCID) 20 MG tablet, Take 20 mg by mouth daily as needed for heartburn or indigestion., Disp: , Rfl:    fluticasone (FLONASE) 50 MCG/ACT nasal spray,  Place 2 sprays into both nostrils 2 (two) times daily as needed for allergies., Disp: , Rfl:    metoprolol tartrate (LOPRESSOR) 25 MG tablet, Take 1 tablet (25 mg total) by mouth daily as needed (svt)., Disp: 10 tablet, Rfl: 0      Objective:   Vitals:   05/10/22 1537  BP: 106/64  Pulse: 70  SpO2: 98%  Weight: 141 lb 6.4 oz (64.1 kg)  Height: '5\' 5"'$  (1.651 m)    Estimated body mass index is 23.53 kg/m as calculated from the following:   Height as of this encounter: '5\' 5"'$  (1.651 m).   Weight as of this encounter: 141 lb 6.4 oz (64.1 kg).  '@WEIGHTCHANGE'$ @  [employer]   05/10/22 1537  Weight: 141 lb 6.4 oz (64.1 kg)     Physical Exam   General: No distress. Looks well Neuro: Alert and Oriented x 3. GCS 15. Speech normal Psych: Pleasant Resp:  Barrel Chest - no.  Wheeze - no, Crackles - no, No overt respiratory distress CVS: Normal heart sounds. Murmurs - no Ext: Stigmata of  Connective Tissue Disease - no HEENT: Normal upper airway. PEERL +. No post nasal drip        Assessment:       ICD-10-CM   1. Pleural effusion, bilateral  J90          Plan:     Patient Instructions     ICD-10-CM   1. Pleural effusion, bilateral  J90       Resovled  Plan As needed followup    SIGNATURE    Dr. Brand Males, M.D., F.C.C.P,  Pulmonary and Critical Care Medicine Staff Physician, [place] Director - Interstitial Lung Disease  Program  Pulmonary [place] [facility] [place], [place], [address]  Pager: [phone], If no answer or between  15:00h - 7:00h: call [phone] Telephone: [phone]  3:58 PM 05/10/2022

## 2022-05-30 ENCOUNTER — Ambulatory Visit: Payer: 59 | Admitting: Obstetrics and Gynecology

## 2022-06-19 NOTE — Progress Notes (Signed)
Entered in error  This note is not being shared with the patient for the following reason: To respect privacy (The patient or proxy has requested that the information not be shared). This note was entered in this chart by error.

## 2022-06-20 NOTE — Progress Notes (Signed)
56 y.o. [id number] Married Caucasian female here for annual exam.    Developed infection following removal of a gallstone.  She had subdiaphragmatic abscess.   Did pelvic floor therapy for urinary incontinence.  She is doing well.   Considering breast MRI.  Daughter is marrying.   PCP:  [name]   No LMP recorded. Patient is postmenopausal.           Sexually active: Yes.    The current method of family planning is post menopausal status.    Exercising: Yes.     Walking & workout class Smoker:  no  Health Maintenance: Pap:  07-13-18 neg HPV HR neg, 04-28-14 neg HPV HR neg History of abnormal Pap:  yes, 2007.  LGSIL.  No treatment.  Fu was normal. MMG:  04-14-22 category b density birads 1:neg Colonoscopy:  age 33 normal BMD:   2010 TDaP:  2017 Gardasil:   no HIV: negative 2023 Hep C: not done Screening Labs: PCP Flu vaccine:  accepts today.  Covid vaccine:  just had Covid around Labor Day.    reports that she has quit smoking. She has never used smokeless tobacco. She reports that she does not currently use alcohol. She reports that she does not use drugs.  Past Medical History:  Diagnosis Date   Allergic rhinitis    Aortic aneurysm ([place]) 09/23/2020   Cancer ([place])    Melanoma   Cervical dysplasia 2007   low-grade SIL, normal Pap smears since then   Family history of adverse reaction to anesthesia    mom has h/o post op nausea/vomiting   Intermittent palpitations    IUD    inserted 03/2002.04/06/2007, 06/03/2012   Ocular migraine    Osteomalacia    Pancreatitis    Seafood allergy     Past Surgical History:  Procedure Laterality Date   BIOPSY  01/21/2022   Procedure: BIOPSY;  Surgeon: Wilford Corner, MD;  Location: [facility];  Service: Gastroenterology;;   Lillard Anes  2018   CHOLECYSTECTOMY N/A 04/09/2016   Procedure: LAPAROSCOPIC CHOLECYSTECTOMY ;  Surgeon: Rolm Bookbinder, MD;  Location: [facility];  Service: General;  Laterality: N/A;   ERCP  04/11/2016    ERCP with pancreatic stent and failed CBD stent after needle knife and conventional sphincterotomy   ERCP N/A 04/12/2016   Procedure: ENDOSCOPIC RETROGRADE CHOLANGIOPANCREATOGRAPHY (ERCP);  Surgeon: Clarene Essex, MD;  Location: [facility] ENDOSCOPY;  Service: Endoscopy;  Laterality: N/A;   ERCP N/A 01/15/2022   Procedure: ENDOSCOPIC RETROGRADE CHOLANGIOPANCREATOGRAPHY (ERCP);  Surgeon: Clarene Essex, MD;  Location: [facility];  Service: Gastroenterology;  Laterality: N/A;   FLEXIBLE SIGMOIDOSCOPY N/A 01/21/2022   Procedure: FLEXIBLE SIGMOIDOSCOPY;  Surgeon: Wilford Corner, MD;  [facility];  Service: Gastroenterology;  Laterality: N/A;   FOOT SURGERY     toenail   INTRAUTERINE DEVICE INSERTION  08/07/2017   Mirena   IR EXCHANGE BILIARY DRAIN  02/11/2022   IR RADIOLOGIST EVAL & MGMT  02/08/2022   IR RADIOLOGIST EVAL & MGMT  02/20/2022   IR RADIOLOGIST EVAL & MGMT  02/28/2022   removal of melanoma     REMOVAL OF STONES  01/15/2022   Procedure: REMOVAL OF STONES;  Surgeon: Clarene Essex, MD;  Location: [facility];  Service: Gastroenterology;;   [name]  01/15/2022   Procedure: [name];  Surgeon: Clarene Essex, MD;  [facility];  Service: Gastroenterology;;    Current Outpatient Medications  Medication Sig Dispense Refill   Adapalene 0.3 % gel Apply topically daily.  famotidine (PEPCID) 20 MG tablet Take 20 mg by mouth daily as needed for heartburn or indigestion.     fluticasone (FLONASE) 50 MCG/ACT nasal spray Place 2 sprays into both nostrils 2 (two) times daily as needed for allergies.     EPINEPHrine 0.3 mg/0.3 mL IJ SOAJ injection Inject 0.3 mg into the muscle as needed for anaphylaxis. (Patient not taking: Reported on 06/27/2022)     metoprolol tartrate (LOPRESSOR) 25 MG tablet Take 1 tablet (25 mg total) by mouth daily as needed (svt). (Patient not taking: Reported on 06/27/2022) 10 tablet 0   No current facility-administered medications for this visit.     Family History  Problem Relation Age of Onset   Tremor Mother    Stroke Father    Autoimmune disease Father    CVA Father    Breast cancer Sister 75   Arthritis Sister        Psoriatic   Thyroid disease Sister    Breast cancer Maternal Grandmother        Age 7   Thyroid disease Daughter        Hypo    Review of Systems  Constitutional: Negative.   HENT: Negative.    Eyes: Negative.   Respiratory: Negative.    Cardiovascular: Negative.   Gastrointestinal: Negative.   Endocrine: Negative.   Genitourinary: Negative.   Musculoskeletal: Negative.   Skin: Negative.   Allergic/Immunologic: Negative.   Neurological: Negative.   Hematological: Negative.   Psychiatric/Behavioral: Negative.      Exam:   BP 112/72   Pulse 79   Ht 5' 4.75" (1.645 m)   Wt 143 lb (64.9 kg)   SpO2 98%   BMI 23.98 kg/m     General appearance: alert, cooperative and appears stated age Head: normocephalic, without obvious abnormality, atraumatic Neck: no adenopathy, supple, symmetrical, trachea midline and thyroid normal to inspection and palpation Lungs: clear to auscultation bilaterally Breasts: normal appearance, no masses or tenderness, No nipple retraction or dimpling, No nipple discharge or bleeding, No axillary adenopathy Heart: regular rate and rhythm Abdomen: soft, non-tender; no masses, no organomegaly Extremities: extremities normal, atraumatic, no cyanosis or edema Skin: skin color, texture, turgor normal. No rashes or lesions Lymph nodes: cervical, supraclavicular, and axillary nodes normal. Neurologic: grossly normal  Pelvic: External genitalia:  no lesions              No abnormal inguinal nodes palpated.              Urethra:  normal appearing urethra with no masses, tenderness or lesions              Bartholins and Skenes: normal                 Vagina: normal appearing vagina with normal color and discharge, no lesions              Cervix: no lesions              Pap  taken: No. Bimanual Exam:  Uterus:  normal size, contour, position, consistency, mobility, non-tender              Adnexa: no mass, fullness, tenderness              Rectal exam: yes.  Confirms.              Anus:  normal sphincter tone, no lesions  Chaperone was present for exam:  Joy, CMA  Assessment:   Well woman  visit with gynecologic exam. Urinary incontinence.  Overactive bladder.  Fecal incontinence.  FH breast cancer.  Sister negative genetic testing.   Patient doing yearly mammogram and MRI of breast.  Menopausal female.  Plan: Mammogram screening discussed. Self breast awareness reviewed. Pap and HR HPV as above. Guidelines for Calcium, Vitamin D, regular exercise program including cardiovascular and weight bearing exercise. Will order breast MRI.  Follow up annually and prn.   After visit summary provided.

## 2022-06-27 ENCOUNTER — Ambulatory Visit (INDEPENDENT_AMBULATORY_CARE_PROVIDER_SITE_OTHER): Payer: 59 | Admitting: Obstetrics and Gynecology

## 2022-06-27 ENCOUNTER — Telehealth: Payer: Self-pay | Admitting: Obstetrics and Gynecology

## 2022-06-27 ENCOUNTER — Encounter: Payer: Self-pay | Admitting: Obstetrics and Gynecology

## 2022-06-27 VITALS — BP 112/72 | HR 79 | Ht 64.75 in | Wt 143.0 lb

## 2022-06-27 DIAGNOSIS — Z23 Encounter for immunization: Secondary | ICD-10-CM

## 2022-06-27 DIAGNOSIS — Z803 Family history of malignant neoplasm of breast: Secondary | ICD-10-CM

## 2022-06-27 DIAGNOSIS — Z01419 Encounter for gynecological examination (general) (routine) without abnormal findings: Secondary | ICD-10-CM

## 2022-06-27 NOTE — Telephone Encounter (Signed)
Breast MRI scheduled for 07/19/22 at 8:50am at [facility].  Will route to [facility] for PA.

## 2022-06-27 NOTE — Telephone Encounter (Signed)
Please place order for breast MRI with contrast to be done at the patient's convenience for family history of breast cancer.   She has been told she has increased risk of breast cancer.  I am not able to find this in her chart.   Let her know that gadolinium contrast does not produce radiation.

## 2022-06-27 NOTE — Telephone Encounter (Signed)
Spoke with patient and informed her that order has been placed. I provided the scheduling phone number so that she can call and schedule and answer the screening questions.  I also infomred her that gadolinium contrast does not have radiation.  I will monitor for when it is scheduled and forward to [facility] for PA.

## 2022-06-27 NOTE — Patient Instructions (Signed)

## 2022-07-02 ENCOUNTER — Other Ambulatory Visit: Payer: Self-pay | Admitting: Family Medicine

## 2022-07-02 DIAGNOSIS — E2839 Other primary ovarian failure: Secondary | ICD-10-CM

## 2022-07-15 ENCOUNTER — Other Ambulatory Visit: Payer: 59

## 2022-07-19 ENCOUNTER — Ambulatory Visit
Admission: RE | Admit: 2022-07-19 | Discharge: 2022-07-19 | Disposition: A | Discharge status: Home / Self Care | Payer: 59 | Source: Ambulatory Visit | Attending: Obstetrics and Gynecology | Admitting: Obstetrics and Gynecology

## 2022-07-19 DIAGNOSIS — Z803 Family history of malignant neoplasm of breast: Secondary | ICD-10-CM

## 2022-07-19 MED ORDER — GADOPICLENOL 0.5 MMOL/ML IV SOLN
6.0000 mL | Freq: Once | INTRAVENOUS | Status: AC | PRN
  Administered 2022-07-19: 6 mL via INTRAVENOUS

## 2022-08-08 ENCOUNTER — Ambulatory Visit
Admission: RE | Admit: 2022-08-08 | Discharge: 2022-08-08 | Disposition: A | Discharge status: Home / Self Care | Payer: 59 | Source: Ambulatory Visit | Attending: Family Medicine | Admitting: Family Medicine

## 2022-08-08 DIAGNOSIS — E2839 Other primary ovarian failure: Secondary | ICD-10-CM

## 2022-08-20 ENCOUNTER — Telehealth: Payer: Self-pay | Admitting: Physician Assistant

## 2022-08-20 NOTE — Telephone Encounter (Signed)
Pt w/ aortic arch mildly dilated at 4.1 cm.   She has a GI bug, had some violent vomiting and got a sharp pain in her chest.   The pain improved, is almost resolved.  She wants to make sure she did not tear anything.  Reassured pt that she did not tear anything, she is treating the nausea, so hopefully will not vomit again.  If she does and has more pain, as long as it goes down/away, it is likely GI related.  Pt reassured.  Rosaria Ferries, PA-C 08/20/2022 7:38 PM

## 2022-09-26 ENCOUNTER — Encounter: Payer: Self-pay | Admitting: Internal Medicine

## 2022-10-08 ENCOUNTER — Ambulatory Visit: Payer: 59 | Admitting: Internal Medicine

## 2022-10-08 ENCOUNTER — Other Ambulatory Visit: Payer: Self-pay

## 2022-10-08 ENCOUNTER — Encounter: Payer: Self-pay | Admitting: Internal Medicine

## 2022-10-08 VITALS — BP 102/50 | HR 66 | Temp 98.3°F | Resp 12 | Ht 65.0 in | Wt 144.0 lb

## 2022-10-08 DIAGNOSIS — T781XXD Other adverse food reactions, not elsewhere classified, subsequent encounter: Secondary | ICD-10-CM

## 2022-10-08 DIAGNOSIS — Z91013 Allergy to seafood: Secondary | ICD-10-CM | POA: Diagnosis not present

## 2022-10-08 DIAGNOSIS — Z889 Allergy status to unspecified drugs, medicaments and biological substances status: Secondary | ICD-10-CM

## 2022-10-08 DIAGNOSIS — J3089 Other allergic rhinitis: Secondary | ICD-10-CM

## 2022-10-08 DIAGNOSIS — Z880 Allergy status to penicillin: Secondary | ICD-10-CM

## 2022-10-08 DIAGNOSIS — T50995D Adverse effect of other drugs, medicaments and biological substances, subsequent encounter: Secondary | ICD-10-CM

## 2022-10-08 MED ORDER — EPINEPHRINE 0.3 MG/0.3ML IJ SOAJ: 0.3000 mg | INTRAMUSCULAR | 1 refills | Status: AC | PRN

## 2022-10-08 MED ORDER — FLUTICASONE PROPIONATE 50 MCG/ACT NA SUSP: 2.0000 | Freq: Every day | NASAL | 5 refills | Status: DC

## 2022-10-08 MED ORDER — LEVOCETIRIZINE DIHYDROCHLORIDE 5 MG PO TABS: 5.0000 mg | ORAL_TABLET | Freq: Every day | ORAL | 5 refills | Status: DC | PRN

## 2022-10-08 NOTE — Progress Notes (Signed)
FOLLOW UP Date of Service/Encounter:  10/08/22   Subjective:  Laurie Terry (DOB: 01-Mar-1966) is a 57 y.o. female who returns to the [facility] [place] on 10/08/2022 for follow up for drug allergies, food allergies and allergic rhino conjunctivitis.   History obtained from: chart review and patient. Last saw Dr. Maudie Mercury 05/23/2021 and discussed penicillin skin test and challenge.  Also with oral allergy syndrome to avocado, banana, melon. Also with allergic rhinoconjunctivitis on Flonase, OTC anti histamine.    \ Drug Allergies:  Since last visit, she was admitted 01/2022 for intra-abdominal abscess with complicated post op course after ERCP: developed diarrhea, R sided pleural effusion.  She was discharged home on Augmentin and Fluconazole. Did fine with augmentin, never had a reaction but has ampicillin allergy listed on chart with hives in childhood still.   During the hospitalization, she was given vancomycin and cefepime and developed coughing with rash of ear.  No other symptoms.  Treated with steroids and anti-histamine for possible allergic reaction.  Of note, she was having trouble with coughing at the time due to effusion.    She has a history of septra and recalls getting it as a young adult and having hives. No other systemic symptoms.   Colestipod is listed as an allergy due to development of hives during hospitalization 01/2022.  However, she recalls taking this at home afterwards for weeks without any issues.   With protonix, she reports joint aches/fever/skin peeling.  Occurred after first dose and lasted for 2 days.       Food Reactions: She does report having a seafood allergy with trouble breathing. She is a vegetarian and has no interested in reintroduction.  She carries an Epipen, no accidental exposures.    She also has PFAS with avocado, banana and melon that she avoids due to mouth and throat itching.   Allergic Rhinoconjunctivitis: Doing okay but does  sometimes have increased congestion, rhinorrhea, sneezing.  Takes OTC benadryl. Has tried Zyrtec/Claritin without relief.  Takes Flonase PRN also which helps but does not resolve it completely.   Past Medical History: Past Medical History:  Diagnosis Date   Allergic rhinitis    Aortic aneurysm ([place]) 09/23/2020   Cancer ([place])    Melanoma   Cervical dysplasia 2007   low-grade SIL, normal Pap smears since then   Family history of adverse reaction to anesthesia    mom has h/o post op nausea/vomiting   Intermittent palpitations    IUD    inserted 03/2002.04/06/2007, 06/03/2012   Ocular migraine    Osteomalacia    Pancreatitis    Seafood allergy     Objective:  BP (!) 102/50   Pulse 66   Temp 98.3 F (36.8 C) (Temporal)   Resp 12   Ht '5\' 5"'$  (1.651 m)   Wt 144 lb (65.3 kg)   SpO2 98%   BMI 23.96 kg/m  Body mass index is 23.96 kg/m. Physical Exam: GEN: alert, well developed HEENT: clear conjunctiva, TM grey and translucent, nose with moderate inferior turbinate hypertrophy, pink nasal mucosa, clear rhinorrhea, no cobblestoning HEART: regular rate and rhythm, no murmur LUNGS: clear to auscultation bilaterally, no coughing, unlabored respiration SKIN: no rashes or lesions  Data Reviewed: Progress note from 01/19/2022 and 01/20/2022 [place] RN and Dr. Doristine Bosworth MD Concern for allergic reaction to Colestid after first dose as she reported "itching to cheeks and chest, and tightness in throat. States it seems a little harder to swallow."  Discontinued medication and  give IV benadryl.  Progress note from 01/25/2022 Dr. Tyrell Antonio: developed coughing spell, rash in ears, lip tingling after cefepime and earlier had received vancomycin that same day.  Both listed as allergies.  Normal exam.  Received prednisone, benadryl, albuterol.    Progress note from Dr. Chase Caller 05/10/2022: Note indicates she was discharged home on long course of Augmentin. No reactions.    Assessment/Plan  Ampicillin  Allergy - I have cleared this from your list.  You have tolerated Augmentin without issues so you cannot be allergic to Ampicillin.   Bactrim Allergy - Hives as a children but can't recall reaction.  This is a low risk so would recommend direct oral challenge. Please call us back and let us know and we could get this scheduled and I would send in the Bactrim that you would pick up and bring with you to the office.  Vancomycin/Cefepime - Unclear if this is a history.  Developed coughing after cefepime/vanc.  There were other reasons for you to have coughing that day.  Also, you tolerated Rocephin and Augmentin which makes it difficult to explain a Cefepime allergy (similar R groups and beta lactam ring). Also, vancomycin can cause non IgE mediated degranulation of mast cells and the treatment for this is to slow down the infusion, avoid opioids while receiving Vanc and PRN antihistamines.  - There are alternatives available for both vancomycin and cefepime.  You can also get desensitized if you ever need it emergently.  There are some case reports that discuss skin testing and challenge but we are unable to do that in office.    Colestipod Allergy - Unclear because she recalls taking this after the hospitalization.  Check at home and confirm, if this it the medication you took after hospitalization then you do not have an allergy to it.    Protonix Allergy - This is concerning for delayed T cell mediated drug reaction but the timeline does not clearly fit as these usually occur weeks after initiation and hers was within a day.  However, recommend avoidance as Protonix has been associated with adverse cutaneous drug reactions (SJS/TEN). There is no desensitization for this.     Food allergy:  - please strictly avoid seafood. She is a vegetarian and would like to continue avoidance.   - for SKIN only reaction, okay to take Benadryl '25mg'$  capsules every 6 hours - for SKIN + ANY additional symptoms, OR  IF concern for LIFE THREATENING reaction = Epipen Autoinjector EpiPen 0.3 mg. - If using Epinephrine autoinjector, call 911  Oral Allergy Syndrome- Avocado - These symptoms are typically not life-threatening and are because of a cross reaction between a pollen you are allergic to, and to a protein in specific foods (such as fresh fruits, vegetables, and nuts). - If you can eat these things and tolerate the symptoms, it is fine to continue to do so.  If not, you may avoid these fresh fruits and vegetables.   - Heating these foods, buying them canned, and peeling these foods should allow them to be consumed without symptoms or with less symptoms.  Allergic Rhinitis Allergic Conjunctivitis  Previous SPT positive to grass, ragweed, weed, trees, mold, dust mites, cat, cockroach.  Start environmental control measures as below. Use over the counter antihistamines such as Xyzal (levocetirizine) daily as needed. May take twice a day during allergy flares. Use Flonase (fluticasone) nasal spray 2 spray per nostril daily. Nasal saline spray (i.e., Simply Saline) or nasal saline lavage (i.e., NeilMed) is  recommended as needed and prior to medicated nasal sprays.   Return in about 6 months (around 04/08/2023). Harlon Flor, MD  [facility]

## 2022-10-08 NOTE — Patient Instructions (Addendum)
Ampicillin Allergy - I have cleared this from your list.  You have tolerated Augmentin without issues so you cannot be allergic to Ampicillin.  Bactrim Allergy - Hives as a children but can't recall reaction.  This is a low risk so would recommend direct oral challenge. Please call us back and let us know and we could get this scheduled and I would send in the Bactrim that you would pick up and bring with you to the office.  Vancomycin/Cefepime - Unclear if this is a history.  There are alternatives available for both of these.  You can also get desensitized if you ever need it emergently.  We do not do skin testing or challenge to this but can be available at certain academic centers.  I will look into this further.   Colestipod Allergy - Unclear because she recalls taking this after the hospitalization.  Check at home and confirm, if this it the medication then you do not have an allergy to it.    Food allergy:  - please strictly avoid seafood. - for SKIN only reaction, okay to take Benadryl '25mg'$  capsules every 6 hours - for SKIN + ANY additional symptoms, OR IF concern for LIFE THREATENING reaction = Epipen Autoinjector EpiPen 0.3 mg. - If using Epinephrine autoinjector, call 911  Oral Allergy Syndrome- Avocado - These symptoms are typically not life-threatening and are because of a cross reaction between a pollen you are allergic to, and to a protein in specific foods (such as fresh fruits, vegetables, and nuts). - If you can eat these things and tolerate the symptoms, it is fine to continue to do so.  If not, you may avoid these fresh fruits and vegetables.   - Heating these foods, buying them canned, and peeling these foods should allow them to be consumed without symptoms or with less symptoms.  Allergic Rhinitis Allergic Conjunctivitis  Previous SPT positive to grass, ragweed, weed, trees, mold, dust mites, cat, cockroach.  Start environmental control measures as below. Use over  the counter antihistamines such as Xyzal (levocetirizine) daily as needed. May take twice a day during allergy flares. Use Flonase (fluticasone) nasal spray 2 spray per nostril daily. Nasal saline spray (i.e., Simply Saline) or nasal saline lavage (i.e., NeilMed) is recommended as needed and prior to medicated nasal sprays.

## 2022-10-09 ENCOUNTER — Encounter: Payer: Self-pay | Admitting: Internal Medicine

## 2022-10-09 NOTE — Addendum Note (Signed)
Addended byHarlon Flor on: 10/09/2022 04:53 PM   Modules accepted: Orders

## 2022-10-22 ENCOUNTER — Telehealth: Payer: Self-pay | Admitting: Internal Medicine

## 2022-10-22 NOTE — Telephone Encounter (Signed)
Referral sent to  [facility] [address] [place], [place] [address] Mamie [phone]  I have faxed referral and notes to their office.  I indicated in the referral to please schedule with any provider that will handle the diagnosis.  They will reach out to patient to schedule.

## 2022-11-25 ENCOUNTER — Ambulatory Visit (HOSPITAL_COMMUNITY)
Admission: RE | Admit: 2022-11-25 | Discharge: 2022-11-25 | Disposition: A | Discharge status: Home / Self Care | Payer: 59 | Source: Ambulatory Visit | Attending: Internal Medicine | Admitting: Internal Medicine

## 2022-11-25 DIAGNOSIS — I712 Thoracic aortic aneurysm, without rupture, unspecified: Secondary | ICD-10-CM | POA: Diagnosis not present

## 2022-11-25 MED ORDER — GADOBUTROL 1 MMOL/ML IV SOLN
6.5000 mL | Freq: Once | INTRAVENOUS | Status: AC | PRN
  Administered 2022-11-25: 6.5 mL via INTRAVENOUS

## 2022-12-18 ENCOUNTER — Other Ambulatory Visit: Payer: Self-pay

## 2022-12-18 ENCOUNTER — Encounter (HOSPITAL_BASED_OUTPATIENT_CLINIC_OR_DEPARTMENT_OTHER): Payer: Self-pay

## 2022-12-18 ENCOUNTER — Emergency Department (HOSPITAL_BASED_OUTPATIENT_CLINIC_OR_DEPARTMENT_OTHER)
Admission: EM | Admit: 2022-12-18 | Discharge: 2022-12-18 | Disposition: A | Discharge status: Home / Self Care | Payer: 59 | Attending: Emergency Medicine | Admitting: Emergency Medicine

## 2022-12-18 DIAGNOSIS — N3001 Acute cystitis with hematuria: Secondary | ICD-10-CM | POA: Diagnosis not present

## 2022-12-18 DIAGNOSIS — R300 Dysuria: Secondary | ICD-10-CM | POA: Diagnosis present

## 2022-12-18 LAB — URINALYSIS, ROUTINE W REFLEX MICROSCOPIC
Bilirubin Urine: NEGATIVE
Glucose, UA: NEGATIVE mg/dL
Ketones, ur: NEGATIVE mg/dL
Nitrite: NEGATIVE
RBC / HPF: >50 RBC/hpf (ref 0–5)
Specific Gravity, Urine: 1.009 (ref 1.005–1.030)
WBC, UA: >50 WBC/hpf (ref 0–5)
pH: 7 (ref 5.0–8.0)

## 2022-12-18 MED ORDER — NITROFURANTOIN MONOHYD MACRO 100 MG PO CAPS
100.0000 mg | ORAL_CAPSULE | Freq: Once | ORAL | Status: AC
  Administered 2022-12-18: 100 mg via ORAL
  Filled 2022-12-18: qty 1

## 2022-12-18 MED ORDER — NITROFURANTOIN MONOHYD MACRO 100 MG PO CAPS: 100.0000 mg | ORAL_CAPSULE | Freq: Two times a day (BID) | ORAL | 0 refills | Status: DC

## 2022-12-18 NOTE — Discharge Instructions (Signed)
Please read and follow all provided instructions.  Your diagnoses today include:  1. Acute cystitis with hematuria     Tests performed today include: Urine test - suggests that you have an infection in your bladder Urine culture - pending Vital signs. See below for your results today.   Medications prescribed:  Macrobid - antibiotic for urinary tract infection  You have been prescribed an antibiotic medicine: take the entire course of medicine even if you are feeling better. Stopping early can cause the antibiotic not to work.  Home care instructions:  Follow any educational materials contained in this packet.  Follow-up instructions: Please follow-up with your primary care provider in 3 days if symptoms are not resolved for further evaluation of your symptoms.  Return instructions:  Please return to the Emergency Department if you experience worsening symptoms.  Return with fever, worsening pain, persistent vomiting, worsening pain in your back.  Please return if you have any other emergent concerns.  Additional Information:  Your vital signs today were: BP 100/74 (BP Location: Right Arm)   Pulse 81   Temp 98.1 F (36.7 C)   Resp 16   Ht 5\' 5"  (1.651 m)   Wt 65.8 kg   SpO2 100%   BMI 24.13 kg/m  If your blood pressure (BP) was elevated above 135/85 this visit, please have this repeated by your doctor within one month. --------------

## 2022-12-18 NOTE — ED Provider Notes (Signed)
[facility] Provider Note   CSN: [id number] Arrival date & time: 12/18/22  1943     History  Chief Complaint  Patient presents with   Dysuria         Laurie Terry is a 57 y.o. female.  Patient presents to the emergency department for evaluation of irritative UTI symptoms.  She has a history of UTI, but not in the recent past.  Patient was in [place] until today when she came home.  She developed irritative UTI symptoms 4 to 5 days ago.  This included dysuria, increased frequency and urgency, suprapubic pain.  She had some chills when symptoms started, but no persistent chills or fevers.  She denies vomiting.  She did have some left back pain at onset as well, however she was helping her daughter move and thinks that it may have been musculoskeletal.  She called her primary care doctor and they had called her in a 3-day course of Macrobid.  She states that she was improving and was "95% better" when she completed the antibiotics.  Then late yesterday and today, her symptoms returned.  She has taken Azo.  She was in a lot of discomfort earlier at the end of her flight.  No current fevers, nausea or vomiting, flank pain.  Patient does report multiple antibiotic allergies.  She reports prolonged hospitalization last year in regards to gallbladder infection.       Home Medications Prior to Admission medications   Medication Sig Start Date End Date Taking? Authorizing Provider  Adapalene 0.3 % gel Apply topically daily. 04/29/22   [provider]  Adapalene 0.3 % gel Apply 0.3 % topically daily as needed.  03/23/23  [provider]  Difluprednate 0.05 % EMUL Place 1 drop into both eyes daily. 09/02/22   [provider]  EPINEPHrine 0.3 mg/0.3 mL IJ SOAJ injection Inject 0.3 mg into the muscle as needed for anaphylaxis. 10/08/22   Larose Kells, MD  famotidine (PEPCID) 20 MG tablet Take 20 mg by mouth daily as needed  for heartburn or indigestion.    [provider]  famotidine (PEPCID) 40 MG tablet Take 40 mg by mouth 2 (two) times daily. 09/20/22   [provider]  fluticasone (FLONASE) 50 MCG/ACT nasal spray Place 2 sprays into both nostrils daily. 10/08/22   Larose Kells, MD  levocetirizine (XYZAL) 5 MG tablet Take 1 tablet (5 mg total) by mouth daily as needed for allergies. 10/08/22   Larose Kells, MD  metoprolol tartrate (LOPRESSOR) 25 MG tablet Take 1 tablet (25 mg total) by mouth daily as needed (svt). Patient not taking: Reported on 06/27/2022 01/26/22   Regalado, Jerald Kief A, MD  nitrofurantoin, macrocrystal-monohydrate, (MACROBID) 100 MG capsule Take 1 capsule (100 mg total) by mouth 2 (two) times daily. 12/18/22   Carlisle Cater, PA-C      Allergies    Cefepime, Contrast media [iodinated contrast media], Iodine, Other, Augmentin [amoxicillin-pot clavulanate], Avocado, Levaquin [levofloxacin], Levofloxacin, Pantoprazole, Septra [bactrim], Sulfamethoxazole-trimethoprim, Vancomycin, and Colestipol hcl    Review of Systems   Review of Systems  Physical Exam Updated Vital Signs BP 100/74 (BP Location: Right Arm)   Pulse 81   Temp 98.1 F (36.7 C)   Resp 16   Ht 5\' 5"  (1.651 m)   Wt 65.8 kg   SpO2 100%   BMI 24.13 kg/m  Physical Exam Vitals and nursing note reviewed.  Constitutional:      General:  She is not in acute distress.    Appearance: She is well-developed.  HENT:     Head: Normocephalic and atraumatic.     Right Ear: External ear normal.     Left Ear: External ear normal.     Nose: Nose normal.  Eyes:     Conjunctiva/sclera: Conjunctivae normal.  Cardiovascular:     Rate and Rhythm: Normal rate and regular rhythm.     Heart sounds: No murmur heard. Pulmonary:     Effort: No respiratory distress.     Breath sounds: No wheezing, rhonchi or rales.  Abdominal:     Palpations: Abdomen is soft.     Tenderness: There is abdominal tenderness. There is no guarding  or rebound.     Comments: Mild to moderate suprapubic tenderness without rebound or guarding  Musculoskeletal:     Cervical back: Normal range of motion and neck supple.     Right lower leg: No edema.     Left lower leg: No edema.  Skin:    General: Skin is warm and dry.     Findings: No rash.  Neurological:     General: No focal deficit present.     Mental Status: She is alert. Mental status is at baseline.     Motor: No weakness.  Psychiatric:        Mood and Affect: Mood normal.     ED Results / Procedures / Treatments   Labs (all labs ordered are listed, but only abnormal results are displayed) Labs Reviewed  URINALYSIS, ROUTINE W REFLEX MICROSCOPIC - Abnormal; Notable for the following components:      Result Value   APPearance CLOUDY (*)    Hgb urine dipstick LARGE (*)    Protein, ur TRACE (*)    Leukocytes,Ua LARGE (*)    Bacteria, UA MANY (*)    Non Squamous Epithelial 0-5 (*)    All other components within normal limits  URINE CULTURE    EKG None  Radiology No results found.  Procedures Procedures    Medications Ordered in ED Medications  nitrofurantoin (macrocrystal-monohydrate) (MACROBID) capsule 100 mg (100 mg Oral Given 12/18/22 2056)    ED Course/ Medical Decision Making/ A&P    Patient seen and examined. History obtained directly from patient. Work-up including labs, imaging, EKG ordered in triage, if performed, were reviewed.    Labs/EKG: Independently reviewed and interpreted.  This included: UA, greater than 50 white cells and greater than 50 red cells, suggestive of cystitis.  Imaging: None ordered  Medications/Fluids: Ordered: P.o. Macrobid  I discussed that typically we would put patient on a different antibiotic given failure, however she was having good improvement with Macrobid.  She was only on a 3-day course.  She has allergies to cephalosporins, Bactrim, levofloxacin.  Will give an additional 5-day course of Macrobid and send  culture for sensitivities.  Patient will follow-up with her primary care doctor to ensure clearance and for follow-up of culture results.  Most recent vital signs reviewed and are as follows: BP 100/74 (BP Location: Right Arm)   Pulse 81   Temp 98.3 F (36.8 C) (Oral)   Resp 16   Ht 5\' 5"  (1.651 m)   Wt 65.8 kg   SpO2 100%   BMI 24.13 kg/m   Initial impression: Uncomplicated cystitis  Home treatment plan: Continue Azo, antibiotics  Return instructions discussed with patient: Return with vomiting, fever, worsening flank pain  Follow-up instructions discussed with patient: Follow-up with PCP for recheck  in 72 hours and for culture results.                            Medical Decision Making Amount and/or Complexity of Data Reviewed Labs: ordered.  Risk Prescription drug management.   Patient with irritative UTI symptoms and suprapubic pain.  I suspect that she was improving with Macrobid, but her infection was incompletely treated as her symptoms have now returned.  No evidence of pyelonephritis.  Low concern for intra-abdominal infection mimicking UTI symptoms.  Given multiple drug allergies, will place her back on Macrobid and give her an additional 5-day course.  She has appropriate PCP follow-up.  The patient's vital signs, pertinent lab work and imaging were reviewed and interpreted as discussed in the ED course. Hospitalization was considered for further testing, treatments, or serial exams/observation. However as patient is well-appearing, has a stable exam, and reassuring studies today, I do not feel that they warrant admission at this time. This plan was discussed with the patient who verbalizes agreement and comfort with this plan and seems reliable and able to return to the Emergency Department with worsening or changing symptoms.         Final Clinical Impression(s) / ED Diagnoses Final diagnoses:  Acute cystitis with hematuria    Rx / DC Orders ED Discharge  Orders          Ordered    nitrofurantoin, macrocrystal-monohydrate, (MACROBID) 100 MG capsule  2 times daily,   Status:  Discontinued        12/18/22 2104    nitrofurantoin, macrocrystal-monohydrate, (MACROBID) 100 MG capsule  2 times daily        12/18/22 2105              Carlisle Cater, Hershal Coria 12/18/22 2116    Wyvonnia Dusky, MD 12/18/22 2132

## 2022-12-18 NOTE — ED Triage Notes (Signed)
POV from home, A&O x 4, GCS 15, amb to triage  Pt sts approx 1 week ago urinary symptoms of urgency, frequency and pressure/burning began. Completed 3 day macrobid without full relief and today all symptoms came back.

## 2022-12-21 LAB — URINE CULTURE: Culture: 50000 — AB

## 2022-12-22 ENCOUNTER — Telehealth (HOSPITAL_BASED_OUTPATIENT_CLINIC_OR_DEPARTMENT_OTHER): Payer: Self-pay

## 2022-12-22 NOTE — Telephone Encounter (Signed)
Post ED Visit - Positive Culture Follow-up  Culture report reviewed by antimicrobial stewardship pharmacist: [facility] Team [x]  Lorelei Pont, Pharm.D. []  Heide Guile, Pharm.D., BCPS AQ-ID []  Parks Neptune, Pharm.D., BCPS []  Alycia Rossetti, Pharm.D., BCPS []  [place], Pharm.D., BCPS, AAHIVP []  Legrand Como, Pharm.D., BCPS, AAHIVP []  Salome Arnt, PharmD, BCPS []  Johnnette Gourd, PharmD, BCPS []  Hughes Better, PharmD, BCPS []  Leeroy Cha, PharmD []  Laqueta Linden, PharmD, BCPS []  Albertina Parr, PharmD  [facility] Team []  Leodis Sias, PharmD []  Lindell Spar, PharmD []  Royetta Asal, PharmD []  Graylin Shiver, Rph []  Rema Fendt) Laurie Terry, PharmD []  Arlyn Dunning, PharmD []  Netta Cedars, PharmD []  Dia Sitter, PharmD []  Leone Haven, PharmD []  Gretta Arab, PharmD []  Theodis Shove, PharmD []  Peggyann Juba, PharmD []  Reuel Boom, PharmD   Positive urine culture Treated with Nitrofurantoin, organism sensitive to the same and no further patient follow-up is required at this time.  Laurie Terry 12/22/2022, 5:31 PM

## 2023-01-24 ENCOUNTER — Ambulatory Visit: Payer: 59 | Attending: Internal Medicine | Admitting: Internal Medicine

## 2023-01-24 ENCOUNTER — Encounter: Payer: Self-pay | Admitting: Internal Medicine

## 2023-01-24 VITALS — BP 100/70 | HR 65 | Ht 65.0 in | Wt 146.2 lb

## 2023-01-24 DIAGNOSIS — E7849 Other hyperlipidemia: Secondary | ICD-10-CM

## 2023-01-24 DIAGNOSIS — I77819 Aortic ectasia, unspecified site: Secondary | ICD-10-CM | POA: Insufficient documentation

## 2023-01-24 DIAGNOSIS — I491 Atrial premature depolarization: Secondary | ICD-10-CM | POA: Diagnosis not present

## 2023-01-24 DIAGNOSIS — I471 Supraventricular tachycardia, unspecified: Secondary | ICD-10-CM | POA: Diagnosis not present

## 2023-01-24 LAB — LDL CHOLESTEROL, DIRECT: LDL Direct: 119 mg/dL — ABNORMAL HIGH (ref 0–99)

## 2023-01-24 NOTE — Patient Instructions (Signed)
Medication Instructions:  Your physician recommends that you continue on your current medications as directed. Please refer to the Current Medication list given to you today.  *If you need a refill on your cardiac medications before your next appointment, please call your pharmacy*   Lab Work: TODAY: [facility]  If you have labs (blood work) drawn today and your tests are completely normal, you will receive your results only by: MyChart Message (if you have MyChart) OR A paper copy in the mail If you have any lab test that is abnormal or we need to change your treatment, we will call you to review the results.   Testing/Procedures: MARCH 2025: Your physician has requested that you have an echocardiogram. Echocardiography is a painless test that uses sound waves to create images of your heart. It provides your doctor with information about the size and shape of your heart and how well your heart's chambers and valves are working. This procedure takes approximately one hour. There are no restrictions for this procedure. Please do NOT wear cologne, perfume, aftershave, or lotions (deodorant is allowed). Please arrive 15 minutes prior to your appointment time.    Follow-Up: At [facility], you and your health needs are our priority.  As part of our continuing mission to provide you with exceptional heart care, we have created designated Provider Care Teams.  These Care Teams include your primary Cardiologist (physician) and Advanced Practice Providers (APPs -  Physician Assistants and Nurse Practitioners) who all work together to provide you with the care you need, when you need it.   Your next appointment:   1 year(s)  Provider:   Riley Lam, MD

## 2023-01-24 NOTE — Progress Notes (Signed)
Cardiology Office Note:    Date:  01/24/2023   ID:  Laurie Terry, DOB 08-15-1966, MRN [id number]  PCP:  [name], MD  [facility] Cardiologist:  Riley Lam MD [facility] Electrophysiologist:  None   CC: Follow up aorta  History of Present Illness:    Laurie Terry is a 57 y.o. female with a hx of Paroxysmal SVT, PACs, Gestational DM with fourth child, family history  of bicuspid aortic valve,  mld thoracic aortic dilation- contrast allergy.   2022: seen without significant palpitations and stable aortic size  2023: Stable MRA 11/29/21 (Aorta 4.1).  Had fevers and ERCP complicated by microperforation, complicated by biloma IR draining.  Patient notes that she is doing well. She is transitioning to different role career wise.   There are no interval hospital/ED visit.   She is still recovering her full strength but is back to working out. Still having rare GI issues.  No chest pain or pressure .  No SOB/DOE and no PND/Orthopnea.  No weight gain or leg swelling.  No palpitations or syncope.    Past Medical History:  Diagnosis Date   Allergic rhinitis    Aortic aneurysm (HCC) 09/23/2020   Cancer (HCC)    Melanoma   Cervical dysplasia 2007   low-grade SIL, normal Pap smears since then   Family history of adverse reaction to anesthesia    mom has h/o post op nausea/vomiting   Intermittent palpitations    IUD    inserted 03/2002.04/06/2007, 06/03/2012   Ocular migraine    Osteomalacia    Pancreatitis    Seafood allergy     Past Surgical History:  Procedure Laterality Date   BIOPSY  01/21/2022   Procedure: BIOPSY;  Surgeon: Charlott Rakes, MD;  Location: [facility];  Service: Gastroenterology;;   Arbutus Leas  2018   CHOLECYSTECTOMY N/A 04/09/2016   Procedure: LAPAROSCOPIC CHOLECYSTECTOMY ;  Surgeon: Emelia Loron, MD;  Location: [facility] OR;  Service: General;  Laterality: N/A;   ERCP  04/11/2016   ERCP with pancreatic stent and failed CBD  stent after needle knife and conventional sphincterotomy   ERCP N/A 04/12/2016   Procedure: ENDOSCOPIC RETROGRADE CHOLANGIOPANCREATOGRAPHY (ERCP);  Surgeon: Vida Rigger, MD;  Location: [facility] ENDOSCOPY;  Service: Endoscopy;  Laterality: N/A;   ERCP N/A 01/15/2022   Procedure: ENDOSCOPIC RETROGRADE CHOLANGIOPANCREATOGRAPHY (ERCP);  Surgeon: Vida Rigger, MD;  Location: [facility];  Service: Gastroenterology;  Laterality: N/A;   FLEXIBLE SIGMOIDOSCOPY N/A 01/21/2022   Procedure: FLEXIBLE SIGMOIDOSCOPY;  Surgeon: Charlott Rakes, MD;  Location: [facility] ENDOSCOPY;  Service: Gastroenterology;  Laterality: N/A;   FOOT SURGERY     toenail   INTRAUTERINE DEVICE INSERTION  08/07/2017   Mirena   IR EXCHANGE BILIARY DRAIN  02/11/2022   IR RADIOLOGIST EVAL & MGMT  02/08/2022   IR RADIOLOGIST EVAL & MGMT  02/20/2022   IR RADIOLOGIST EVAL & MGMT  02/28/2022   removal of melanoma     REMOVAL OF STONES  01/15/2022   Procedure: REMOVAL OF STONES;  Surgeon: Vida Rigger, MD;  Location: [facility];  Service: Gastroenterology;;   Dennison Mascot  01/15/2022   Procedure: Dennison Mascot;  Surgeon: Vida Rigger, MD;  Location: [facility] ENDOSCOPY;  Service: Gastroenterology;;    Current Medications: Current Meds  Medication Sig   Adapalene 0.3 % gel Apply topically daily.   Adapalene 0.3 % gel Apply 0.3 % topically daily as needed.   EPINEPHrine 0.3 mg/0.3 mL IJ SOAJ injection Inject 0.3 mg into the muscle as needed for  anaphylaxis.   famotidine (PEPCID) 20 MG tablet Take 20 mg by mouth daily as needed for heartburn or indigestion.   famotidine (PEPCID) 40 MG tablet Take 40 mg by mouth 2 (two) times daily.   fluticasone (FLONASE) 50 MCG/ACT nasal spray Place 2 sprays into both nostrils daily.   levocetirizine (XYZAL) 5 MG tablet Take 1 tablet (5 mg total) by mouth daily as needed for allergies.   methylPREDNISolone (MEDROL DOSEPAK) 4 MG TBPK tablet See admin instructions.   metoprolol tartrate (LOPRESSOR) 25 MG tablet Take 1 tablet  (25 mg total) by mouth daily as needed (svt).   predniSONE (STERAPRED UNI-PAK 21 TAB) 10 MG (21) TBPK tablet Take by mouth daily.     Allergies:   Contrast media [iodinated contrast media], Iodine, Other, Augmentin [amoxicillin-pot clavulanate], Avocado, Levaquin [levofloxacin], Levofloxacin, Pantoprazole, Vancomycin, and Colestipol hcl   Social History   Socioeconomic History   Marital status: Married    Spouse name: Not on file   Number of children: Not on file   Years of education: Not on file   Highest education level: Not on file  Occupational History   Not on file  Tobacco Use   Smoking status: Former    Passive exposure: Past   Smokeless tobacco: Never  Vaping Use   Vaping Use: Never used  Substance and Sexual Activity   Alcohol use: Not Currently   Drug use: No   Sexual activity: Yes    Partners: Male    Birth control/protection: Post-menopausal    Comment: des neg  Other Topics Concern   Not on file  Social History Narrative   Not on file   Social Determinants of Health   Financial Resource Strain: Not on file  Food Insecurity: Not on file  Transportation Needs: Not on file  Physical Activity: Not on file  Stress: Not on file  Social Connections: Not on file    SOCIAL: Has four kids; one daughter lives in [place]  Family History: The patient's family history includes Arthritis in her sister; Autoimmune disease in her father; Breast cancer in her maternal grandmother; Breast cancer (age of onset: 79) in her sister; CVA in her father; Stroke in her father; Thyroid disease in her daughter and sister; Tremor in her mother.  History of coronary artery disease notable for no members. History of heart failure notable for no members. No history of cardiomyopathies including hypertrophic cardiomyopathy, left ventricular non-compaction, or arrhythmogenic right ventricular cardiomyopathy. History of arrhythmia notable for atrial fibrillation in father. Denies family  history of sudden cardiac death including drowning, car accidents, or unexplained deaths in the family.  Father died after being his by a drunk driver at age 43. Father had bicuspid aortic valve and aneurysm without dissection.   ROS:   Please see the history of present illness.     All other systems reviewed and are negative.  EKGs/Labs/Other Studies Reviewed:    The following studies were reviewed today:  EKG:   04/13/2016: Sinus Bradycardia Rate 53 WNL  Cardiac Studies & Procedures     STRESS TESTS  ECHOCARDIOGRAM STRESS TEST 11/21/2020  Narrative EXERCISE STRESS ECHO REPORT   --------------------------------------------------------------------------------  Patient Name:   LEVERTA SCHRADER [facility] Date of Exam: 11/20/2020 Medical Rec #:  [id number]           Height:       65.5 in Accession #:    [id number]          Weight:  150.0 lb Date of Birth:  1966/06/07           BSA:          1.760 m Patient Age:    54 years            BP:           90/69 mmHg Patient Gender: F                   HR:           70 bpm. Exam Location:  [address]  Procedure: Stress Echo  Indications:    R07.9 Chest pain  History:        Patient has prior history of Echocardiogram examinations, most recent 11/07/2020. Palpitations, Thoracic Aortic Aneurysm.  Sonographer:    Farrel Conners RDCS Referring Phys: [id number] [facility] A Airyanna Dipalma  IMPRESSIONS   1. This is a negative stress echocardiogram for ischemia. 2. This is a low risk study.  FINDINGS  Exam Protocol: The patient exercised on a treadmill according to a Bruce protocol.   Patient Performance: The patient exercised for 9 minutes and 10 seconds, achieving 10 METS. The maximum stage achieved was IV of the Bruce protocol. The baseline heart rate was 70 bpm. The heart rate at peak stress was 171 bpm. The target heart rate was calculated to be 140 bpm. The percentage of maximum predicted heart rate achieved was 104.1 %. The baseline  blood pressure was 90/69 mmHg. The blood pressure at peak stress was 132/76 mmHg. The blood pressure response was normal. The patient developed fatigue during the stress exam. The patient's functional capacity was average.  EKG: Resting EKG showed normal sinus rhythm with no abnormal findings. The patient developed no abnormal EKG findings during exercise.   2D Echo Findings: The baseline ejection fraction was 60%. The peak ejection fraction at stress was 80%. Baseline regional wall motion abnormalities were not present. There were no stress-induced wall motion abnormalities. This is a negative stress echocardiogram for ischemia.   Armanda Magic MD Electronically signed on 11/20/2020 at 3:24:21 PM      Final   ECHOCARDIOGRAM  ECHOCARDIOGRAM COMPLETE 01/23/2022  Narrative ECHOCARDIOGRAM REPORT    Patient Name:   DENAH MOTT [facility] Date of Exam: 01/23/2022 Medical Rec #:  [id number]           Height:       64.0 in Accession #:    [id number]          Weight:       149.0 lb Date of Birth:  1965/12/23           BSA:          1.726 m Patient Age:    55 years            BP:           109/62 mmHg Patient Gender: F                   HR:           89 bpm. Exam Location:  Inpatient  Procedure: 2D Echo, Cardiac Doppler and Color Doppler  Indications:     CHF  History:         Patient has prior history of Echocardiogram examinations, most recent 05/24/2021.  Sonographer:     Eduard Roux Referring Phys:  [id number] Kalman Shan Diagnosing Phys: Donato Schultz MD  IMPRESSIONS   1. Left ventricular ejection fraction, by estimation,  is 60 to 65%. The left ventricle has normal function. The left ventricle has no regional wall motion abnormalities. Left ventricular diastolic parameters are consistent with Grade I diastolic dysfunction (impaired relaxation). 2. Right ventricular systolic function is normal. The right ventricular size is normal. 3. The mitral valve is normal in structure. No  evidence of mitral valve regurgitation. No evidence of mitral stenosis. 4. The aortic valve is normal in structure. Aortic valve regurgitation is not visualized. No aortic stenosis is present. 5. The inferior vena cava is dilated in size with <50% respiratory variability, suggesting right atrial pressure of 15 mmHg.  FINDINGS Left Ventricle: Left ventricular ejection fraction, by estimation, is 60 to 65%. The left ventricle has normal function. The left ventricle has no regional wall motion abnormalities. The left ventricular internal cavity size was normal in size. There is no left ventricular hypertrophy. Left ventricular diastolic parameters are consistent with Grade I diastolic dysfunction (impaired relaxation).  Right Ventricle: The right ventricular size is normal. No increase in right ventricular wall thickness. Right ventricular systolic function is normal.  Left Atrium: Left atrial size was normal in size.  Right Atrium: Right atrial size was normal in size.  Pericardium: There is no evidence of pericardial effusion.  Mitral Valve: The mitral valve is normal in structure. No evidence of mitral valve regurgitation. No evidence of mitral valve stenosis.  Tricuspid Valve: The tricuspid valve is normal in structure. Tricuspid valve regurgitation is trivial. No evidence of tricuspid stenosis.  Aortic Valve: The aortic valve is normal in structure. Aortic valve regurgitation is not visualized. No aortic stenosis is present. Aortic valve peak gradient measures 5.8 mmHg.  Pulmonic Valve: The pulmonic valve was normal in structure. Pulmonic valve regurgitation is not visualized. No evidence of pulmonic stenosis.  Aorta: The aortic root is normal in size and structure.  Venous: The inferior vena cava is dilated in size with less than 50% respiratory variability, suggesting right atrial pressure of 15 mmHg.  IAS/Shunts: No atrial level shunt detected by color flow Doppler.   LEFT  VENTRICLE PLAX 2D LVIDd:         4.50 cm   Diastology LVIDs:         3.25 cm   LV e' medial:    10.90 cm/s LV PW:         0.90 cm   LV E/e' medial:  7.8 LV IVS:        0.90 cm   LV e' lateral:   10.10 cm/s LVOT diam:     2.10 cm   LV E/e' lateral: 8.4 LV SV:         71 LV SV Index:   41 LVOT Area:     3.46 cm   RIGHT VENTRICLE             IVC RV Basal diam:  2.90 cm     IVC diam: 2.10 cm RV S prime:     14.20 cm/s TAPSE (M-mode): 2.4 cm  LEFT ATRIUM             Index        RIGHT ATRIUM           Index LA diam:        2.90 cm 1.68 cm/m   RA Area:     13.20 cm LA Vol (A2C):   44.4 ml 25.72 ml/m  RA Volume:   33.00 ml  19.11 ml/m LA Vol (A4C):   48.2 ml 27.92 ml/m  LA Biplane Vol: 46.1 ml 26.70 ml/m AORTIC VALVE                 PULMONIC VALVE AV Area (Vmax): 3.25 cm     PV Vmax:       0.79 m/s AV Vmax:        120.50 cm/s  PV Peak grad:  2.5 mmHg AV Peak Grad:   5.8 mmHg LVOT Vmax:      113.00 cm/s LVOT Vmean:     73.900 cm/s LVOT VTI:       0.205 m  AORTA Ao Root diam: 3.90 cm Ao Asc diam:  3.90 cm  MITRAL VALVE MV Area (PHT): 7.29 cm    SHUNTS MV Decel Time: 104 msec    Systemic VTI:  0.20 m MV E velocity: 84.90 cm/s  Systemic Diam: 2.10 cm MV A velocity: 85.40 cm/s MV E/A ratio:  0.99  Donato Schultz MD Electronically signed by Donato Schultz MD Signature Date/Time: 01/23/2022/1:13:41 PM    Final (Updated)    MONITORS  LONG TERM MONITOR (3-14 DAYS) 12/27/2021  Narrative  Patient had a minimum heart rate of 48 bpm, maximum heart rate of 164 bpm, and average heart rate of 74 bpm.  Predominant underlying rhythm was sinus rhythm.  Thirteen runs of suprventricular tachycardia occurred lasting 19 beats at longest with a max rate of 164 bpm at fastest.  Isolated PACs were rare (<1.0%).  Isolated PVCs were rare (<1.0%).  Triggered and diary events associated with sinus rhythm but occasionally with SVT.  Symptomatic SVT.             Recent  Labs: 03/29/2022: ALT 12; BUN 14; Creatinine, Ser 0.65; Hemoglobin 12.8; Platelets 277; Potassium 3.8; Sodium 142  Recent Lipid Panel    Component Value Date/Time   CHOL 147 04/07/2016 0331   TRIG 57 04/07/2016 0331   HDL 48 04/07/2016 0331   CHOLHDL 3.1 04/07/2016 0331   VLDL 11 04/07/2016 0331   LDLCALC 88 04/07/2016 0331    Physical Exam:    VS:  BP 100/70   Pulse 65   Ht 5\' 5"  (1.651 m)   Wt 146 lb 3.2 oz (66.3 kg)   SpO2 96%   BMI 24.33 kg/m     Wt Readings from Last 3 Encounters:  01/24/23 146 lb 3.2 oz (66.3 kg)  12/18/22 145 lb (65.8 kg)  10/08/22 144 lb (65.3 kg)    Gen: no distress  Neck: No JVD Cardiac: No Rubs or Gallops, no Murmur, RRR +2radial pulses Respiratory: Clear to auscultation bilaterally, normal effort, normal  respiratory rate GI: Soft, nontender, drain still in place MS: No  edema;  moves all extremities Integument: Skin feels warm Neuro:  At time of evaluation, alert and oriented to person/place/time/situation  Psych: Normal affect, patient feels in reasonable spirits considering all she has been through   ASSESSMENT:    1. Dilatation of aorta (HCC)   2. Other hyperlipidemia   3. PAC (premature atrial contraction)     PLAN:    Asymptomatic thoracic aortic dilation with iodine allergy Family history of bicuspid aortic valve - Last at aortic size 42 mm (SOV) - echo in 11/2023; if stable < 45 mm will not repeat MRA that year  Hyperlipidemia  -LDL goal less than 100 - LDL direct - if elevated will get CAC score  PACs and P-SVT - metoprolol 25 mg PO PRN palpitations but has not needed  One year with me  Medication Adjustments/Labs and  Tests Ordered: Current medicines are reviewed at length with the patient today.  Concerns regarding medicines are outlined above.  Orders Placed This Encounter  Procedures   LDL cholesterol, direct   EKG 12-Lead   ECHOCARDIOGRAM COMPLETE    No orders of the defined types were placed in this  encounter.    Patient Instructions  Medication Instructions:  Your physician recommends that you continue on your current medications as directed. Please refer to the Current Medication list given to you today.  *If you need a refill on your cardiac medications before your next appointment, please call your pharmacy*   Lab Work: TODAY: LDL Direct  If you have labs (blood work) drawn today and your tests are completely normal, you will receive your results only by: MyChart Message (if you have MyChart) OR A paper copy in the mail If you have any lab test that is abnormal or we need to change your treatment, we will call you to review the results.   Testing/Procedures: MARCH 2025: Your physician has requested that you have an echocardiogram. Echocardiography is a painless test that uses sound waves to create images of your heart. It provides your doctor with information about the size and shape of your heart and how well your heart's chambers and valves are working. This procedure takes approximately one hour. There are no restrictions for this procedure. Please do NOT wear cologne, perfume, aftershave, or lotions (deodorant is allowed). Please arrive 15 minutes prior to your appointment time.    Follow-Up: At [facility], you and your health needs are our priority.  As part of our continuing mission to provide you with exceptional heart care, we have created designated Provider Care Teams.  These Care Teams include your primary Cardiologist (physician) and Advanced Practice Providers (APPs -  Physician Assistants and Nurse Practitioners) who all work together to provide you with the care you need, when you need it.   Your next appointment:   1 year(s)  Provider:   Riley Lam, MD        Signed, Christell Constant, MD  01/24/2023 11:23 AM    [facility]

## 2023-01-26 ENCOUNTER — Encounter: Payer: Self-pay | Admitting: Internal Medicine

## 2023-01-26 DIAGNOSIS — E7849 Other hyperlipidemia: Secondary | ICD-10-CM

## 2023-01-27 NOTE — Telephone Encounter (Signed)
Results and MD comments viewed on my chart.

## 2023-03-03 ENCOUNTER — Ambulatory Visit (HOSPITAL_BASED_OUTPATIENT_CLINIC_OR_DEPARTMENT_OTHER)
Admission: RE | Admit: 2023-03-03 | Discharge: 2023-03-03 | Disposition: A | Discharge status: Home / Self Care | Payer: 59 | Source: Ambulatory Visit | Attending: Internal Medicine | Admitting: Internal Medicine

## 2023-03-03 ENCOUNTER — Encounter: Payer: Self-pay | Admitting: Internal Medicine

## 2023-03-03 DIAGNOSIS — E7849 Other hyperlipidemia: Secondary | ICD-10-CM | POA: Insufficient documentation

## 2023-03-05 ENCOUNTER — Other Ambulatory Visit: Payer: Self-pay | Admitting: Obstetrics and Gynecology

## 2023-03-05 DIAGNOSIS — Z1231 Encounter for screening mammogram for malignant neoplasm of breast: Secondary | ICD-10-CM

## 2023-03-10 ENCOUNTER — Other Ambulatory Visit: Payer: Self-pay | Admitting: Gastroenterology

## 2023-03-10 DIAGNOSIS — R1011 Right upper quadrant pain: Secondary | ICD-10-CM

## 2023-03-10 DIAGNOSIS — R1010 Upper abdominal pain, unspecified: Secondary | ICD-10-CM

## 2023-03-12 ENCOUNTER — Ambulatory Visit
Admission: RE | Admit: 2023-03-12 | Discharge: 2023-03-12 | Disposition: A | Discharge status: Home / Self Care | Payer: 59 | Source: Ambulatory Visit | Attending: Gastroenterology | Admitting: Gastroenterology

## 2023-03-12 DIAGNOSIS — R1011 Right upper quadrant pain: Secondary | ICD-10-CM

## 2023-03-12 DIAGNOSIS — R1010 Upper abdominal pain, unspecified: Secondary | ICD-10-CM

## 2023-03-12 MED ORDER — GADOPICLENOL 0.5 MMOL/ML IV SOLN
7.5000 mL | Freq: Once | INTRAVENOUS | Status: AC | PRN
  Administered 2023-03-12: 7.5 mL via INTRAVENOUS

## 2023-04-08 ENCOUNTER — Ambulatory Visit: Payer: 59 | Admitting: Internal Medicine

## 2023-04-21 ENCOUNTER — Ambulatory Visit
Admission: RE | Admit: 2023-04-21 | Discharge: 2023-04-21 | Disposition: A | Discharge status: Home / Self Care | Payer: 59 | Source: Ambulatory Visit | Attending: Obstetrics and Gynecology | Admitting: Obstetrics and Gynecology

## 2023-04-21 DIAGNOSIS — Z1231 Encounter for screening mammogram for malignant neoplasm of breast: Secondary | ICD-10-CM

## 2023-05-15 NOTE — Progress Notes (Deleted)
57 y.o. G83P4004 Married Caucasian female here for annual exam.    PCP:     No LMP recorded. Patient is postmenopausal.           Sexually active: {yes no:314532}  The current method of family planning is post menopausal status.    Exercising: {yes no:314532}  {types:19826} Smoker:  former  Health Maintenance: Pap:  07/13/18 neg: HR HPV neg History of abnormal Pap:  yes, 2007. LGSIL. No treatment. Fu was normal.  MMG:  04/21/23 Breast Density B, BI-RADS CAT 1 neg Colonoscopy:  01/29/22 BMD:   06/19/09  Result  ??? TDaP:  11/24/15 Gardasil:   no HIV: 01/16/22 NR Hep C: n/a Screening Labs:  Hb today: ***, Urine today: ***   reports that she has quit smoking. She has been exposed to tobacco smoke. She has never used smokeless tobacco. She reports that she does not currently use alcohol. She reports that she does not use drugs.  Past Medical History:  Diagnosis Date   Allergic rhinitis    Aortic aneurysm (HCC) 09/23/2020   Cancer (HCC)    Melanoma   Cervical dysplasia 2007   low-grade SIL, normal Pap smears since then   Family history of adverse reaction to anesthesia    mom has h/o post op nausea/vomiting   Intermittent palpitations    IUD    inserted 03/2002.04/06/2007, 06/03/2012   Ocular migraine    Osteomalacia    Pancreatitis    Seafood allergy     Past Surgical History:  Procedure Laterality Date   BIOPSY  01/21/2022   Procedure: BIOPSY;  Surgeon: Charlott Rakes, MD;  Location: [facility];  Service: Gastroenterology;;   Arbutus Leas  2018   CHOLECYSTECTOMY N/A 04/09/2016   Procedure: LAPAROSCOPIC CHOLECYSTECTOMY ;  Surgeon: Emelia Loron, MD;  Location: [facility] OR;  Service: General;  Laterality: N/A;   ERCP  04/11/2016   ERCP with pancreatic stent and failed CBD stent after needle knife and conventional sphincterotomy   ERCP N/A 04/12/2016   Procedure: ENDOSCOPIC RETROGRADE CHOLANGIOPANCREATOGRAPHY (ERCP);  Surgeon: Vida Rigger, MD;  Location: [facility] ENDOSCOPY;  Service:  Endoscopy;  Laterality: N/A;   ERCP N/A 01/15/2022   Procedure: ENDOSCOPIC RETROGRADE CHOLANGIOPANCREATOGRAPHY (ERCP);  Surgeon: Vida Rigger, MD;  Location: [facility];  Service: Gastroenterology;  Laterality: N/A;   FLEXIBLE SIGMOIDOSCOPY N/A 01/21/2022   Procedure: FLEXIBLE SIGMOIDOSCOPY;  Surgeon: Charlott Rakes, MD;  Location: [facility] ENDOSCOPY;  Service: Gastroenterology;  Laterality: N/A;   FOOT SURGERY     toenail   INTRAUTERINE DEVICE INSERTION  08/07/2017   Mirena   IR EXCHANGE BILIARY DRAIN  02/11/2022   IR RADIOLOGIST EVAL & MGMT  02/08/2022   IR RADIOLOGIST EVAL & MGMT  02/20/2022   IR RADIOLOGIST EVAL & MGMT  02/28/2022   removal of melanoma     REMOVAL OF STONES  01/15/2022   Procedure: REMOVAL OF STONES;  Surgeon: Vida Rigger, MD;  Location: [facility];  Service: Gastroenterology;;   Dennison Mascot  01/15/2022   Procedure: Dennison Mascot;  Surgeon: Vida Rigger, MD;  Location: [facility] ENDOSCOPY;  Service: Gastroenterology;;    Current Outpatient Medications  Medication Sig Dispense Refill   Adapalene 0.3 % gel Apply topically daily.     EPINEPHrine 0.3 mg/0.3 mL IJ SOAJ injection Inject 0.3 mg into the muscle as needed for anaphylaxis. 2 each 1   famotidine (PEPCID) 20 MG tablet Take 20 mg by mouth daily as needed for heartburn or indigestion.     famotidine (PEPCID) 40 MG tablet Take 40  mg by mouth 2 (two) times daily.     fluticasone (FLONASE) 50 MCG/ACT nasal spray Place 2 sprays into both nostrils daily. 16 g 5   levocetirizine (XYZAL) 5 MG tablet Take 1 tablet (5 mg total) by mouth daily as needed for allergies. 30 tablet 5   methylPREDNISolone (MEDROL DOSEPAK) 4 MG TBPK tablet See admin instructions.     metoprolol tartrate (LOPRESSOR) 25 MG tablet Take 1 tablet (25 mg total) by mouth daily as needed (svt). 10 tablet 0   predniSONE (STERAPRED UNI-PAK 21 TAB) 10 MG (21) TBPK tablet Take by mouth daily.     No current facility-administered medications for this visit.    Family  History  Problem Relation Age of Onset   Tremor Mother    Stroke Father    Autoimmune disease Father    CVA Father    Breast cancer Sister 77       3X   Arthritis Sister        Psoriatic   Thyroid disease Sister    Thyroid disease Daughter        Hypo   Breast cancer Maternal Grandmother        Age 40    Review of Systems  Exam:   There were no vitals taken for this visit.    General appearance: alert, cooperative and appears stated age Head: normocephalic, without obvious abnormality, atraumatic Neck: no adenopathy, supple, symmetrical, trachea midline and thyroid normal to inspection and palpation Lungs: clear to auscultation bilaterally Breasts: normal appearance, no masses or tenderness, No nipple retraction or dimpling, No nipple discharge or bleeding, No axillary adenopathy Heart: regular rate and rhythm Abdomen: soft, non-tender; no masses, no organomegaly Extremities: extremities normal, atraumatic, no cyanosis or edema Skin: skin color, texture, turgor normal. No rashes or lesions Lymph nodes: cervical, supraclavicular, and axillary nodes normal. Neurologic: grossly normal  Pelvic: External genitalia:  no lesions              No abnormal inguinal nodes palpated.              Urethra:  normal appearing urethra with no masses, tenderness or lesions              Bartholins and Skenes: normal                 Vagina: normal appearing vagina with normal color and discharge, no lesions              Cervix: no lesions              Pap taken: {yes no:314532} Bimanual Exam:  Uterus:  normal size, contour, position, consistency, mobility, non-tender              Adnexa: no mass, fullness, tenderness              Rectal exam: {yes no:314532}.  Confirms.              Anus:  normal sphincter tone, no lesions  Chaperone was present for exam:  ***  Assessment:   Well woman visit with gynecologic exam.   Plan: Mammogram screening discussed. Self breast awareness  reviewed. Pap and HR HPV as above. Guidelines for Calcium, Vitamin D, regular exercise program including cardiovascular and weight bearing exercise.   Follow up annually and prn.   Additional counseling given.  {yes [id number]. _______ minutes face to face time of which over 50% was spent in counseling.    After visit summary  provided.

## 2023-05-29 ENCOUNTER — Ambulatory Visit: Payer: 59 | Admitting: Obstetrics and Gynecology

## 2023-06-23 ENCOUNTER — Ambulatory Visit: Payer: 59 | Admitting: Obstetrics and Gynecology

## 2023-09-30 NOTE — Progress Notes (Signed)
58 y.o. [id number] Married Caucasian female here for annual exam.    Has gained 10 pounds.   Having urinary and fecal incontinence.  PT was helpful. Has urgency and difficulty getting to the toilet on time.  Voiding every 2 hours.  Not getting up every night to void.  No leak with cough, laugh, exercise. Started Benefiber. Declines medication for bladder control.  Not sleeping well.  Husband does snore and had a glucose monitor that goes off.   One child married and two others are engaged.   PCP: [name], MD   No LMP recorded. Patient is postmenopausal.           Sexually active: Yes.    The current method of family planning is post menopausal status.    Menopausal hormone therapy:  n/a Exercising: No.   Smoker:  no  OB History  Gravida Para Term Preterm AB Living  4 4 4   4   SAB IAB Ectopic Multiple Live Births          # Outcome Date GA Lbr Len/2nd Weight Sex Type Anes PTL Lv  4 Term           3 Term           2 Term           1 Term              HEALTH MAINTENANCE: Last 2 paps:  07-13-18 neg HPV HR neg, 04-28-14 neg HPV HR neg  History of abnormal Pap or positive HPV:  yes, 2007. LGSIL. No treatment. Fu was normal.  Mammogram:   04/21/23 Breast Density Cat B, BI-RADS CAT 1 neg.  Breast MRI done 07/19/22 - BI-RADS1.  Colonoscopy:  8 years ago, normal per pt Bone Density:  n/a  Result  n/a   Immunization History  Administered Date(s) Administered   Hepatitis A, Adult 11/24/2015, 05/31/2016   Influenza,inj,Quad PF,6+ Mos 06/21/2013, 07/01/2016, 06/08/2017, 06/22/2020, 06/27/2022   Influenza-Unspecified 06/24/2015, 07/15/2019   PFIZER(Purple Top)SARS-COV-2 Vaccination 12/04/2019, 12/25/2019, 08/11/2020, 11/20/2020, 05/30/2021   Td 11/24/2015   Tdap 01/03/2006, 11/24/2015   Typhoid Inactivated 11/24/2015   Zoster, Live 05/18/2020, 10/20/2020      reports that she has quit smoking. She has been exposed to tobacco smoke. She has never used smokeless  tobacco. She reports that she does not currently use alcohol. She reports that she does not use drugs.  Past Medical History:  Diagnosis Date   Allergic rhinitis    Aortic aneurysm (HCC) 09/23/2020   Cancer (HCC)    Melanoma   Cervical dysplasia 2007   low-grade SIL, normal Pap smears since then   Family history of adverse reaction to anesthesia    mom has h/o post op nausea/vomiting   Intermittent palpitations    IUD    inserted 03/2002.04/06/2007, 06/03/2012   Ocular migraine    Osteomalacia    Pancreatitis    Seafood allergy     Past Surgical History:  Procedure Laterality Date   BIOPSY  01/21/2022   Procedure: BIOPSY;  Surgeon: Charlott Rakes, MD;  Location: [facility];  Service: Gastroenterology;;   Arbutus Leas  2018   CHOLECYSTECTOMY N/A 04/09/2016   Procedure: LAPAROSCOPIC CHOLECYSTECTOMY ;  Surgeon: Emelia Loron, MD;  Location: [facility] OR;  Service: General;  Laterality: N/A;   ERCP  04/11/2016   ERCP with pancreatic stent and failed CBD stent after needle knife and conventional sphincterotomy   ERCP N/A 04/12/2016   Procedure: ENDOSCOPIC RETROGRADE CHOLANGIOPANCREATOGRAPHY (  ERCP);  Surgeon: Vida Rigger, MD;  Location: [facility];  Service: Endoscopy;  Laterality: N/A;   ERCP N/A 01/15/2022   Procedure: ENDOSCOPIC RETROGRADE CHOLANGIOPANCREATOGRAPHY (ERCP);  Surgeon: Vida Rigger, MD;  Location: [facility];  Service: Gastroenterology;  Laterality: N/A;   FLEXIBLE SIGMOIDOSCOPY N/A 01/21/2022   Procedure: FLEXIBLE SIGMOIDOSCOPY;  Surgeon: Charlott Rakes, MD;  Location: [facility];  Service: Gastroenterology;  Laterality: N/A;   FOOT SURGERY     toenail   INTRAUTERINE DEVICE INSERTION  08/07/2017   Mirena   IR EXCHANGE BILIARY DRAIN  02/11/2022   IR RADIOLOGIST EVAL & MGMT  02/08/2022   IR RADIOLOGIST EVAL & MGMT  02/20/2022   IR RADIOLOGIST EVAL & MGMT  02/28/2022   removal of melanoma     REMOVAL OF STONES  01/15/2022   Procedure: REMOVAL OF STONES;  Surgeon: Vida Rigger, MD;  Location: [facility];  Service: Gastroenterology;;   Dennison Mascot  01/15/2022   Procedure: Dennison Mascot;  Surgeon: Vida Rigger, MD;  Location: [facility];  Service: Gastroenterology;;    Current Outpatient Medications  Medication Sig Dispense Refill   Adapalene 0.3 % gel Apply topically daily.     EPINEPHrine 0.3 mg/0.3 mL IJ SOAJ injection Inject 0.3 mg into the muscle as needed for anaphylaxis. 2 each 1   famotidine (PEPCID) 20 MG tablet Take 20 mg by mouth daily as needed for heartburn or indigestion.     famotidine (PEPCID) 40 MG tablet Take 40 mg by mouth 2 (two) times daily.     fluticasone (FLONASE) 50 MCG/ACT nasal spray Place 2 sprays into both nostrils daily. 16 g 5   No current facility-administered medications for this visit.    ALLERGIES: Contrast media [iodinated contrast media], Iodine, Other, Augmentin [amoxicillin-pot clavulanate], Avocado, Levaquin [levofloxacin], Levofloxacin, Pantoprazole, Vancomycin, and Colestipol hcl  Family History  Problem Relation Age of Onset   Tremor Mother    Stroke Father    Autoimmune disease Father    CVA Father    Breast cancer Sister 74       3X   Arthritis Sister        Psoriatic   Thyroid disease Sister    Thyroid disease Daughter        Hypo   Breast cancer Maternal Grandmother        Age 40    Review of Systems  All other systems reviewed and are negative.   PHYSICAL EXAM:  BP 112/68 (BP Location: Left Arm, Patient Position: Sitting, Cuff Size: Small)   Ht 5' 6.5" (1.689 m)   Wt 156 lb (70.8 kg)   BMI 24.80 kg/m     General appearance: alert, cooperative and appears stated age Head: normocephalic, without obvious abnormality, atraumatic Neck: no adenopathy, supple, symmetrical, trachea midline and thyroid normal to inspection and palpation Lungs: clear to auscultation bilaterally Breasts: normal appearance, no masses or tenderness, No nipple retraction or dimpling, No nipple discharge or  bleeding, No axillary adenopathy Heart: regular rate and rhythm Abdomen: soft, non-tender; no masses, no organomegaly Extremities: extremities normal, atraumatic, no cyanosis or edema Skin: skin color, texture, turgor normal. No rashes or lesions Lymph nodes: cervical, supraclavicular, and axillary nodes normal. Neurologic: grossly normal  Pelvic: External genitalia:  no lesions              No abnormal inguinal nodes palpated.              Urethra:  normal appearing urethra with no masses, tenderness or lesions  Bartholins and Skenes: normal                 Vagina: normal appearing vagina with normal color and discharge, no lesions              Cervix: no lesions              Pap taken: yes Bimanual Exam:  Uterus:  normal size, contour, position, consistency, mobility, non-tender              Adnexa: no mass, fullness, tenderness              Rectal exam: yes.  Confirms.              Anus:  normal sphincter tone, no lesions  Chaperone was present for exam:  Warren Lacy, CMA  ASSESSMENT: Well woman visit with gynecologic exam Urinary incontinence.  Overactive bladder.  Fecal incontinence.  FH breast cancer.  Sister negative genetic testing.   Patient doing yearly mammogram and MRI of breast.  At increased risk of breast cancer - 24%.   PLAN: Mammogram screening discussed. Breast MRI ordered.  I discussed referral to oncology.  Patient will consider.  Self breast awareness reviewed. Pap and HRV collected:  Yes.   Guidelines for Calcium, Vitamin D, regular exercise program including cardiovascular and weight bearing exercise. Medication refills:  NA Start Metamucil.  Referral to urogynecology.  Follow up:  yearly and prn.

## 2023-10-01 ENCOUNTER — Ambulatory Visit: Payer: 59 | Admitting: Obstetrics and Gynecology

## 2023-10-13 ENCOUNTER — Encounter: Payer: Self-pay | Admitting: Obstetrics and Gynecology

## 2023-10-13 ENCOUNTER — Ambulatory Visit (INDEPENDENT_AMBULATORY_CARE_PROVIDER_SITE_OTHER): Payer: No Typology Code available for payment source | Admitting: Obstetrics and Gynecology

## 2023-10-13 ENCOUNTER — Other Ambulatory Visit (HOSPITAL_COMMUNITY)
Admission: RE | Admit: 2023-10-13 | Discharge: 2023-10-13 | Disposition: A | Discharge status: Home / Self Care | Payer: 59 | Source: Ambulatory Visit | Attending: Obstetrics and Gynecology | Admitting: Obstetrics and Gynecology

## 2023-10-13 ENCOUNTER — Telehealth: Payer: Self-pay | Admitting: Obstetrics and Gynecology

## 2023-10-13 VITALS — BP 112/68 | Ht 66.5 in | Wt 156.0 lb

## 2023-10-13 DIAGNOSIS — Z124 Encounter for screening for malignant neoplasm of cervix: Secondary | ICD-10-CM | POA: Diagnosis present

## 2023-10-13 DIAGNOSIS — N3281 Overactive bladder: Secondary | ICD-10-CM

## 2023-10-13 DIAGNOSIS — Z01419 Encounter for gynecological examination (general) (routine) without abnormal findings: Secondary | ICD-10-CM

## 2023-10-13 DIAGNOSIS — R159 Full incontinence of feces: Secondary | ICD-10-CM

## 2023-10-13 DIAGNOSIS — Z9189 Other specified personal risk factors, not elsewhere classified: Secondary | ICD-10-CM

## 2023-10-13 NOTE — Telephone Encounter (Signed)
Spoke with patient, provided number for scheduling Breast MRI. PA process reviewed. Patient aware to contact insurance provider or [facility] to determine OOP cost.   Patient aware to call if any additional assistance needed. Patient verbalizes understanding and is agreeable.   Encounter closed.

## 2023-10-13 NOTE — Patient Instructions (Signed)

## 2023-10-13 NOTE — Telephone Encounter (Signed)
Please facilitate scheduling of breast MRI with contrast. Patient has increased risk of breast cancer - 24%.   I have already placed the order.

## 2023-10-16 LAB — CYTOLOGY - PAP
Comment: NEGATIVE
Diagnosis: NEGATIVE
High risk HPV: NEGATIVE

## 2023-10-20 ENCOUNTER — Encounter: Payer: Self-pay | Admitting: Obstetrics and Gynecology

## 2023-10-21 ENCOUNTER — Encounter: Payer: Self-pay | Admitting: Obstetrics and Gynecology

## 2023-10-27 ENCOUNTER — Encounter: Payer: Self-pay | Admitting: Obstetrics and Gynecology

## 2023-10-27 ENCOUNTER — Ambulatory Visit
Admission: RE | Admit: 2023-10-27 | Discharge: 2023-10-27 | Discharge status: Home / Self Care | Payer: 59 | Source: Ambulatory Visit | Attending: Obstetrics and Gynecology | Admitting: Obstetrics and Gynecology

## 2023-10-27 DIAGNOSIS — Z9189 Other specified personal risk factors, not elsewhere classified: Secondary | ICD-10-CM

## 2023-10-27 MED ORDER — GADOPICLENOL 0.5 MMOL/ML IV SOLN
7.0000 mL | Freq: Once | INTRAVENOUS | Status: AC | PRN
  Administered 2023-10-27: 7 mL via INTRAVENOUS

## 2023-11-19 ENCOUNTER — Encounter: Payer: Self-pay | Admitting: Internal Medicine

## 2023-11-24 ENCOUNTER — Ambulatory Visit (HOSPITAL_COMMUNITY): Payer: No Typology Code available for payment source | Attending: Internal Medicine

## 2023-11-24 DIAGNOSIS — I471 Supraventricular tachycardia, unspecified: Secondary | ICD-10-CM

## 2023-11-24 DIAGNOSIS — I77819 Aortic ectasia, unspecified site: Secondary | ICD-10-CM | POA: Insufficient documentation

## 2023-11-24 LAB — ECHOCARDIOGRAM COMPLETE
Area-P 1/2: 4.31 cm2
S' Lateral: 2.9 cm

## 2023-11-26 ENCOUNTER — Other Ambulatory Visit (HOSPITAL_COMMUNITY): Payer: 59

## 2023-11-26 ENCOUNTER — Encounter: Payer: Self-pay | Admitting: Internal Medicine

## 2024-01-06 ENCOUNTER — Ambulatory Visit: Payer: 59 | Admitting: Obstetrics

## 2024-01-08 ENCOUNTER — Encounter: Payer: Self-pay | Admitting: Obstetrics and Gynecology

## 2024-01-08 ENCOUNTER — Ambulatory Visit (INDEPENDENT_AMBULATORY_CARE_PROVIDER_SITE_OTHER): Admitting: Obstetrics and Gynecology

## 2024-01-08 ENCOUNTER — Other Ambulatory Visit (HOSPITAL_COMMUNITY)
Admission: RE | Admit: 2024-01-08 | Discharge: 2024-01-08 | Disposition: A | Discharge status: Home / Self Care | Source: Ambulatory Visit | Attending: Obstetrics and Gynecology | Admitting: Obstetrics and Gynecology

## 2024-01-08 ENCOUNTER — Encounter: Payer: Self-pay | Admitting: Internal Medicine

## 2024-01-08 VITALS — BP 112/78 | HR 79 | Ht 64.5 in | Wt 159.8 lb

## 2024-01-08 DIAGNOSIS — R350 Frequency of micturition: Secondary | ICD-10-CM | POA: Diagnosis not present

## 2024-01-08 DIAGNOSIS — N952 Postmenopausal atrophic vaginitis: Secondary | ICD-10-CM

## 2024-01-08 DIAGNOSIS — R339 Retention of urine, unspecified: Secondary | ICD-10-CM

## 2024-01-08 DIAGNOSIS — R82998 Other abnormal findings in urine: Secondary | ICD-10-CM | POA: Insufficient documentation

## 2024-01-08 DIAGNOSIS — Z823 Family history of stroke: Secondary | ICD-10-CM

## 2024-01-08 DIAGNOSIS — R151 Fecal smearing: Secondary | ICD-10-CM

## 2024-01-08 DIAGNOSIS — E7849 Other hyperlipidemia: Secondary | ICD-10-CM

## 2024-01-08 DIAGNOSIS — N3941 Urge incontinence: Secondary | ICD-10-CM

## 2024-01-08 LAB — POCT URINALYSIS DIPSTICK
Bilirubin, UA: NEGATIVE
Glucose, UA: NEGATIVE
Ketones, UA: NEGATIVE
Nitrite, UA: NEGATIVE
Protein, UA: NEGATIVE
Spec Grav, UA: 1.015 (ref 1.010–1.025)
Urobilinogen, UA: 0.2 U/dL
pH, UA: 7 (ref 5.0–8.0)

## 2024-01-08 MED ORDER — ESTRADIOL 0.1 MG/GM VA CREA: 0.5000 g | TOPICAL_CREAM | VAGINAL | 11 refills | Status: DC

## 2024-01-08 NOTE — Patient Instructions (Addendum)
 We need to do bladder testing and then we can plan for PTNS and/or SNM discussion.   Constipation: Our goal is to achieve formed bowel movements daily or every-other-day.  You may need to try different combinations of the following options to find what works best for you - everybody's body works differently so feel free to adjust the dosages as needed.  Some options to help maintain bowel health include:  "Power Pudding" is a natural mixture that may help your constipation.  To make blend 1 cup applesauce, 1 cup wheat bran, and 3/4 cup prune juice, refrigerate and then take 1 tablespoon daily with a large glass of water as needed.  Today we talked about ways to manage bladder urgency such as altering your diet to avoid irritative beverages and foods (bladder diet) as well as attempting to decrease stress and other exacerbating factors.   The Most Bothersome Foods* The Least Bothersome Foods*  Coffee - Regular & Decaf Tea - caffeinated Carbonated beverages - cola, non-colas, diet & caffeine-free Alcohols - Beer, Red Wine, White Wine, [address] - Grapefruit, [place], Orange, [employer] - Cranberry, Grapefruit, Orange, Pineapple Vegetables - Tomato & Tomato Products Flavor Enhancers - Hot peppers, Spicy foods, Chili, Horseradish, Vinegar, Monosodium glutamate (MSG) Artificial Sweeteners - NutraSweet, Sweet 'N Low, Equal (sweetener), Saccharin Ethnic foods - [place], [place], [place] food [employer] - low-fat & whole Fruits - Bananas, Blueberries, Honeydew melon, Pears, Raisins, Watermelon Vegetables - Broccoli, [address] Sprouts, [place], Carrots, Cauliflower, [place], Cucumber, Mushrooms, Peas, Radishes, Squash, Zucchini, White potatoes, Sweet potatoes & yams Poultry - Chicken, Eggs, [place], [employer] - Beef, Diplomatic Services operational officer, Lamb Seafood - Shrimp, [place] fish, Salmon Grains - Oat, Rice Snacks - Pretzels, Popcorn  *Theodosia Fishman et al. Diet and its role in interstitial cystitis/bladder pain  syndrome (IC/BPS) and comorbid conditions. [employer]. [employer]U Int. 2012 Jan 11.

## 2024-01-08 NOTE — Progress Notes (Signed)
 [facility] New Patient Evaluation and Consultation  Referring Provider: [name], MD PCP: [name], MD Date of Service: 01/08/2024  SUBJECTIVE Chief Complaint: New Patient (Initial Visit) Laurie Terry is a 58 y.o. female is here for OAB and fecal incontinence.)  History of Present Illness: Laurie Terry is a 58 y.o. White or Caucasian female seen in consultation at the request of Dr. Corliss Blacker for evaluation of urinary incontinence and fecal incontinence.  Has tried self directed pelvic floor exercises and pelvic floor PT. In 2023 when the symptoms started she had a microperforation with an ERCP and then had a drain, the FI and UUI started after.   Review of records significant for: Did pelvic floor PT with Eulis Foster  CLINICAL DATA:  Acute abdominal pain, right upper quadrant pain. Recent ERCP and peritoneal abscess.   EXAM: 03/29/22 CT ABDOMEN AND PELVIS WITHOUT CONTRAST   TECHNIQUE: Multidetector CT imaging of the abdomen and pelvis was performed following the standard protocol without IV contrast.   RADIATION DOSE REDUCTION: This exam was performed according to the departmental dose-optimization program which includes automated exposure control, adjustment of the mA and/or kV according to patient size and/or use of iterative reconstruction technique.   COMPARISON:  CT abdomen and pelvis 02/28/2022. CT abdomen and pelvis 02/20/2022.   FINDINGS: Lower chest: No acute abnormality.   Hepatobiliary: Pneumobilia again seen. The gallbladder surgically absent. The liver is within normal limits.   Pancreas: Unremarkable. No pancreatic ductal dilatation or surrounding inflammatory changes.   Spleen: Normal in size without focal abnormality.   Adrenals/Urinary Tract: There is minimal right perinephric stranding similar to the prior study. No perinephric fluid collection identified. There is a single punctate calculus in the right  kidney, unchanged. No hydronephrosis in either kidney. The adrenal glands and bladder are within normal limits.   Stomach/Bowel: Stomach is within normal limits. Appendix appears normal. No evidence of bowel wall thickening, distention, or inflammatory changes. There is sigmoid colon diverticulosis.   Vascular/Lymphatic: No significant vascular findings are present. No enlarged abdominal or pelvic lymph nodes.   Reproductive: Uterus and bilateral adnexa are unremarkable.   Other: No abdominal wall hernia or abnormality. No abdominopelvic ascites. The percutaneous drainage catheter has been removed in the interval. No residual fluid collection identified in the right retroperitoneum. Single small nodular density in the right perinephric space image 2/40 measuring 6 mm may represent some residual scarring or small lymph node.   Musculoskeletal: Degenerative changes affect the spine   IMPRESSION: 1. No acute process identified. 2. Percutaneous drainage catheter has been removed. There is some residual scarring and stranding surrounding the right kidney. 3. Nonobstructing right renal calculus. 4. Stable pneumobilia status post cholecystectomy. 5. Sigmoid colon diverticulosis.  Urinary Symptoms: Leaks urine with with a full bladder, with movement to the bathroom, with urgency, and without sensation Leaks with urge intermittently Pad use: None Patient is bothered by UI symptoms.  Day time voids 4-6.  Nocturia: 1-2 times per night to void. Voiding dysfunction:  empties bladder well.  Patient does not use a catheter to empty bladder.  When urinating, patient feels she has no difficulties Drinks: 24oz Decaf coffee and 16-24oz herbal tea per day  UTIs: 1 UTI's in the last year.   Denies history of blood in urine, kidney or bladder stones, pyelonephritis, bladder cancer, and kidney cancer No results found for the last 90 days.   Pelvic Organ Prolapse Symptoms:  Patient Denies a feeling of a bulge the vaginal area.   Bowel Symptom: Bowel movements: 2-3 time(s) per week Stool consistency: solid Straining: no.  Splinting: yes if she is constipated.  Incomplete evacuation: yes.  Patient Admits to accidental bowel leakage / fecal incontinence  Occurs: intermittent  Consistency with leakage: solid Bowel regimen: diet, fiber, and miralax Last colonoscopy: Date 2014, Results WNL [facility] Colonoscopy          Current Care Gaps     Colonoscopy (Every 10 Years) Never done   No completion history exists for this topic.                 Sexual Function Sexually active: yes.  Sexual orientation: heterosexual Pain with sex: Yes, has discomfort due to dryness  Pelvic Pain Denies pelvic pain   Past Medical History:  Past Medical History:  Diagnosis Date   Allergic rhinitis    Aortic aneurysm (HCC) 09/23/2020   Cancer (HCC)    Melanoma   Cervical dysplasia 2007   low-grade SIL, normal Pap smears since then   Family history of adverse reaction to anesthesia    mom has h/o post op nausea/vomiting   Intermittent palpitations    IUD    inserted 03/2002.04/06/2007, 06/03/2012   Ocular migraine    Osteomalacia    Pancreatitis    Seafood allergy      Past Surgical History:   Past Surgical History:  Procedure Laterality Date   BIOPSY  01/21/2022   Procedure: BIOPSY;  Surgeon: Baldo Bonds, MD;  Location: [facility];  Service: Gastroenterology;;   Tillman Folks  2018   CHOLECYSTECTOMY N/A 04/09/2016   Procedure: LAPAROSCOPIC CHOLECYSTECTOMY ;  Surgeon: Enid Harry, MD;  Location: [facility] OR;  Service: General;  Laterality: N/A;   ERCP  04/11/2016   ERCP with pancreatic stent and failed CBD stent after needle knife and conventional sphincterotomy   ERCP N/A 04/12/2016   Procedure: ENDOSCOPIC RETROGRADE CHOLANGIOPANCREATOGRAPHY (ERCP);  Surgeon: Ozell Blunt, MD;  Location: [facility];  Service: Endoscopy;  Laterality: N/A;   ERCP N/A  01/15/2022   Procedure: ENDOSCOPIC RETROGRADE CHOLANGIOPANCREATOGRAPHY (ERCP);  Surgeon: Ozell Blunt, MD;  Location: [facility];  Service: Gastroenterology;  Laterality: N/A;   FLEXIBLE SIGMOIDOSCOPY N/A 01/21/2022   Procedure: FLEXIBLE SIGMOIDOSCOPY;  Surgeon: Baldo Bonds, MD;  Location: [facility];  Service: Gastroenterology;  Laterality: N/A;   FOOT SURGERY     toenail   INTRAUTERINE DEVICE INSERTION  08/07/2017   Mirena   IR EXCHANGE BILIARY DRAIN  02/11/2022   IR RADIOLOGIST EVAL & MGMT  02/08/2022   IR RADIOLOGIST EVAL & MGMT  02/20/2022   IR RADIOLOGIST EVAL & MGMT  02/28/2022   removal of melanoma     REMOVAL OF STONES  01/15/2022   Procedure: REMOVAL OF STONES;  Surgeon: Ozell Blunt, MD;  Location: [facility];  Service: Gastroenterology;;   [address]  01/15/2022   Procedure: [address];  Surgeon: Ozell Blunt, MD;  Location: [facility];  Service: Gastroenterology;;     Past OB/GYN History: [id number] Vaginal deliveries: 4,  Forceps/ Vacuum deliveries: 0, Cesarean section: 0 Menopausal: No/Unknown Contraception: IUD. Last pap smear was 09/2023.  Any history of abnormal pap smears: no. HM PAP   This patient has no relevant Health Maintenance data.     Medications: Patient has a current medication list which includes the following prescription(s): adapalene, epinephrine, estradiol, famotidine, famotidine, fluticasone, and nitrofurantoin (macrocrystal-monohydrate).   Allergies: Patient is allergic to contrast media [iodinated contrast media], iodine, other, augmentin [amoxicillin-pot  clavulanate], avocado, levaquin [levofloxacin], levofloxacin, pantoprazole, vancomycin, and colestipol hcl.   Social History:  Social History   Tobacco Use   Smoking status: Former    Passive exposure: Past   Smokeless tobacco: Never  Vaping Use   Vaping status: Never Used  Substance Use Topics   Alcohol use: Not Currently   Drug use: No    Relationship status:  married Patient lives with husband.   Patient is employed [employer]. Regular exercise: No History of abuse: No  Family History:   Family History  Problem Relation Age of Onset   Tremor Mother    Stroke Father    Autoimmune disease Father    CVA Father    Breast cancer Sister 51       3X   Arthritis Sister        Psoriatic   Thyroid disease Sister    Thyroid disease Daughter        Hypo   Breast cancer Maternal Grandmother        Age 78     Review of Systems: Review of Systems  Constitutional:  Negative for chills and fever.       +Weight Gain  Respiratory:  Negative for cough and shortness of breath.   Cardiovascular:  Negative for chest pain and palpitations.  Gastrointestinal:  Negative for abdominal pain, blood in stool, constipation and diarrhea.  Skin:  Negative for rash.  Neurological:  Negative for weakness.  Psychiatric/Behavioral:  Negative for depression and suicidal ideas.      OBJECTIVE Physical Exam: Vitals:   01/08/24 0944  BP: 112/78  Pulse: 79  Weight: 159 lb 12.8 oz (72.5 kg)  Height: 5' 4.5" (1.638 m)    Physical Exam Vitals reviewed. Exam conducted with a chaperone present.  Constitutional:      Appearance: Normal appearance.  Pulmonary:     Effort: Pulmonary effort is normal.  Abdominal:     Palpations: Abdomen is soft.  Neurological:     General: No focal deficit present.     Mental Status: She is alert and oriented to person, place, and time.  Psychiatric:        Mood and Affect: Mood normal.        Behavior: Behavior normal. Behavior is cooperative.        Thought Content: Thought content normal.      GU / Detailed Urogynecologic Evaluation:  Pelvic Exam: Normal external female genitalia; Bartholin's and Skene's glands normal in appearance; urethral meatus normal in appearance, no urethral masses or discharge.   CST: negative  Speculum exam reveals normal vaginal mucosa with atrophy. Cervix normal appearance. Uterus normal  single, nontender. Adnexa normal adnexa.     Pelvic floor strength II/V, puborectalis II/V external anal sphincter I/V  Pelvic floor musculature: Right levator non-tender, Right obturator non-tender, Left levator non-tender, Left obturator non-tender  POP-Q:   POP-Q  -3                                            Aa   -3                                           Ba  -7  C   3                                            Gh  3.5                                            Pb  10                                            tvl   -2                                            Ap  -2                                            Bp  -7.5                                              D      Rectal Exam:  Normal sphincter tone, no distal rectocele, enterocoele not present, no rectal masses, noted dyssynergia when asking the patient to bear down.  Post-Void Residual (PVR) by Bladder Scan: In order to evaluate bladder emptying, we discussed obtaining a postvoid residual and patient agreed to this procedure.  Procedure: The ultrasound unit was placed on the patient's abdomen in the suprapubic region after the patient had voided.    Post Void Residual - 01/08/24 1038       Post Void Residual   Post Void Residual 92 mL              Laboratory Results: Lab Results  Component Value Date   COLORU yellow 01/08/2024   CLARITYU clear 01/08/2024   GLUCOSEUR Negative 01/08/2024   BILIRUBINUR negative 01/08/2024   KETONESU negative 01/08/2024   SPECGRAV 1.015 01/08/2024   RBCUR trace intact 01/08/2024   PHUR 7.0 01/08/2024   PROTEINUR Negative 01/08/2024   UROBILINOGEN 0.2 01/08/2024   LEUKOCYTESUR Small (1+) (A) 01/08/2024    Lab Results  Component Value Date   CREATININE 0.65 03/29/2022   CREATININE 0.64 02/15/2022   CREATININE 0.53 02/01/2022    Lab Results  Component Value Date   HGBA1C 5.0 04/07/2016    Lab Results   Component Value Date   HGB 12.8 03/29/2022     ASSESSMENT AND PLAN Ms. Szeliga is a 58 y.o. with:  1. Urinary frequency   2. Incomplete bladder emptying   3. Fecal smearing   4. Urge incontinence of urine   5. Leukocytes in urine   6. Vaginal atrophy    We discussed the symptoms of overactive bladder (OAB), which include urinary urgency, urinary frequency, nocturia, with or without urge incontinence.  While we do not know the exact etiology of OAB, several treatment options exist. We discussed management  including behavioral therapy (decreasing bladder irritants, urge suppression strategies, timed voids, bladder retraining), physical therapy, medication; for refractory cases posterior tibial nerve stimulation, sacral neuromodulation, and intravesical botulinum toxin injection. Patient has significant problems with her bowels including Fecal incontinence and constipation that waxes and wanes. She would not be a good candidate for anticholinergic medications. She is interested in PTNS and has done lifestyle changes previously with reduction of acid, a vegetarian diet, and pelvic floor PT. Will start with PTNS. Do not want to start patient on Beta 3 blocker at this time due to the PVR today of close to 100.  Patient's PVR was 92 today. She is actively being treated for a UTI with Macrobid which could be a factor to her emptying.  Patient's main concern is her fecal smearing. She has significant weekly loss of bowel that is impacting her quality of life. We will start with evaluating her bladder emptying and see how she does with PTNS. She also would like to re-start pelvic floor PT. New referral sent for pelvic floor PT.  Will start with PTNS and then consider options for SNM moving forward.  Will send urine for culture. She is actively on antibiotics at this time.  Patient has vaginal atrophy on exam. She would benefit from estrogen cream. Patient to use a blueberry sized amount into the vagina.  She may use this nightly for 2 weeks and then twice weekly after. We discussed using her finger instead of using the applicator.   Patient to follow up for PTNS and Urodynamics. She will then follow up with the surgeon for plan of care moving forward.      Christan Ciccarelli G Kina Shiffman, NP

## 2024-01-09 ENCOUNTER — Encounter: Payer: Self-pay | Admitting: Obstetrics and Gynecology

## 2024-01-09 LAB — URINE CULTURE: Culture: NO GROWTH

## 2024-01-12 ENCOUNTER — Encounter: Payer: Self-pay | Admitting: Obstetrics and Gynecology

## 2024-01-19 ENCOUNTER — Other Ambulatory Visit: Payer: Self-pay | Admitting: Internal Medicine

## 2024-01-19 ENCOUNTER — Ambulatory Visit: Payer: 59 | Admitting: Internal Medicine

## 2024-01-20 ENCOUNTER — Encounter: Payer: Self-pay | Admitting: Obstetrics and Gynecology

## 2024-01-20 ENCOUNTER — Ambulatory Visit (INDEPENDENT_AMBULATORY_CARE_PROVIDER_SITE_OTHER): Admitting: Obstetrics and Gynecology

## 2024-01-20 VITALS — BP 100/70 | HR 84

## 2024-01-20 DIAGNOSIS — R350 Frequency of micturition: Secondary | ICD-10-CM

## 2024-01-20 DIAGNOSIS — N3281 Overactive bladder: Secondary | ICD-10-CM

## 2024-01-20 DIAGNOSIS — N3941 Urge incontinence: Secondary | ICD-10-CM

## 2024-01-20 NOTE — Progress Notes (Signed)
 [facility]  PTNS VISIT  CC:  Overactive bladder  58 y.o. with refractory overactive bladder who presents for percutaneous tibial nerve stimulation. The patient presents for PTNS session # 1.  Patient has pins in both feet which may impact sensation and the left foot has been broken previously.   Patient to fill out bladder diary.    Procedure: The patient was placed in the sitting position and the right lower extremity was prepped in the usual fashion. The PTNS needle was then inserted at a 60 degree angle, 5 cm cephalad and 2 cm posterior to the medial malleolus. The PTNS unit was then programmed and an optimal response was noted at 3 milliamps. The PTNS stimulation was then performed at this setting for 30 minutes and at the end of the procedure she felt a strong sensation in her ankle and in her large toe joint. The needle was removed and hemostasis was noted. Patient was able to walk without difficulty but reported a stinging sensation still.   The pt will return in 1 week for PTNS session # 2. All questions were answered.

## 2024-01-22 ENCOUNTER — Encounter: Payer: Self-pay | Admitting: Internal Medicine

## 2024-01-26 ENCOUNTER — Ambulatory Visit: Admitting: Obstetrics and Gynecology

## 2024-01-26 ENCOUNTER — Encounter: Payer: Self-pay | Admitting: Obstetrics and Gynecology

## 2024-01-26 VITALS — BP 99/68 | HR 74

## 2024-01-26 DIAGNOSIS — R350 Frequency of micturition: Secondary | ICD-10-CM

## 2024-01-26 DIAGNOSIS — N3281 Overactive bladder: Secondary | ICD-10-CM | POA: Diagnosis not present

## 2024-01-26 DIAGNOSIS — N3941 Urge incontinence: Secondary | ICD-10-CM

## 2024-01-26 NOTE — Progress Notes (Signed)
 [facility]  PTNS VISIT  CC:  Overactive bladder  58 y.o. with refractory overactive bladder who presents for percutaneous tibial nerve stimulation. The patient presents for PTNS session # 2.   Procedure: The patient was placed in the sitting position and the right lower extremity was prepped in the usual fashion. The PTNS needle was then inserted at a 60 degree angle, 5 cm cephalad and 2 cm posterior to the medial malleolus. The PTNS unit was then programmed and an optimal response was noted at 6 milliamps. The PTNS stimulation was then performed at this setting for 30 minutes without incident and the patient tolerated the procedure well. The needle was removed and hemostasis was noted.   The pt will return in 1 week for PTNS session # 3. All questions were answered.

## 2024-01-27 MED ORDER — METOPROLOL TARTRATE 25 MG PO TABS: 25.0000 mg | ORAL_TABLET | Freq: Every day | ORAL | 3 refills | Status: DC | PRN

## 2024-02-02 ENCOUNTER — Ambulatory Visit (INDEPENDENT_AMBULATORY_CARE_PROVIDER_SITE_OTHER): Admitting: Obstetrics and Gynecology

## 2024-02-02 ENCOUNTER — Encounter: Payer: Self-pay | Admitting: Obstetrics and Gynecology

## 2024-02-02 VITALS — BP 93/68 | HR 71

## 2024-02-02 DIAGNOSIS — N3281 Overactive bladder: Secondary | ICD-10-CM

## 2024-02-02 DIAGNOSIS — N3941 Urge incontinence: Secondary | ICD-10-CM

## 2024-02-02 DIAGNOSIS — R350 Frequency of micturition: Secondary | ICD-10-CM

## 2024-02-02 NOTE — Progress Notes (Signed)
 [facility]  PTNS VISIT  CC:  Overactive bladder  58 y.o. with refractory overactive bladder who presents for percutaneous tibial nerve stimulation. The patient presents for PTNS session # 3.   Procedure: The patient was placed in the sitting position and the right lower extremity was prepped in the usual fashion. The PTNS needle was then inserted at a 60 degree angle, 5 cm cephalad and 2 cm posterior to the medial malleolus. The PTNS unit was then programmed and an optimal response was noted at 6 milliamps. The PTNS stimulation was then performed at this setting for 30 minutes without incident and the patient tolerated the procedure well. The needle was removed and hemostasis was noted.   The pt will return in 1 week for PTNS session # 4. All questions were answered.

## 2024-02-07 LAB — LIPOPROTEIN A (LPA): Lipoprotein (a): <8.4 nmol/L (ref <75.0)

## 2024-02-08 ENCOUNTER — Ambulatory Visit: Payer: Self-pay | Admitting: Internal Medicine

## 2024-02-09 ENCOUNTER — Ambulatory Visit: Admitting: Obstetrics and Gynecology

## 2024-02-09 ENCOUNTER — Encounter: Payer: Self-pay | Admitting: Obstetrics and Gynecology

## 2024-02-09 VITALS — BP 106/70 | HR 72

## 2024-02-09 DIAGNOSIS — N3941 Urge incontinence: Secondary | ICD-10-CM

## 2024-02-09 DIAGNOSIS — N3281 Overactive bladder: Secondary | ICD-10-CM

## 2024-02-09 DIAGNOSIS — R151 Fecal smearing: Secondary | ICD-10-CM

## 2024-02-09 DIAGNOSIS — R350 Frequency of micturition: Secondary | ICD-10-CM

## 2024-02-09 NOTE — Progress Notes (Signed)
 [facility]  PTNS VISIT  CC:  Overactive bladder  58 y.o. with refractory overactive bladder who presents for percutaneous tibial nerve stimulation. The patient presents for PTNS session # 4.   Procedure: The patient was placed in the sitting position and the right lower extremity was prepped in the usual fashion. The PTNS needle was then inserted at a 60 degree angle, 5 cm cephalad and 2 cm posterior to the medial malleolus. The PTNS unit was then programmed and an optimal response was noted at 8 milliamps. The PTNS stimulation was then performed at this setting for 30 minutes without incident and the patient tolerated the procedure well. The needle was removed and hemostasis was noted.   The pt will return in 1 week for PTNS session # 5. All questions were answered.

## 2024-02-17 ENCOUNTER — Encounter: Payer: Self-pay | Admitting: Obstetrics and Gynecology

## 2024-02-17 ENCOUNTER — Ambulatory Visit (INDEPENDENT_AMBULATORY_CARE_PROVIDER_SITE_OTHER): Admitting: Obstetrics and Gynecology

## 2024-02-17 VITALS — BP 110/74 | HR 72

## 2024-02-17 DIAGNOSIS — R350 Frequency of micturition: Secondary | ICD-10-CM

## 2024-02-17 DIAGNOSIS — N3941 Urge incontinence: Secondary | ICD-10-CM

## 2024-02-17 DIAGNOSIS — N3281 Overactive bladder: Secondary | ICD-10-CM | POA: Diagnosis not present

## 2024-02-17 NOTE — Progress Notes (Signed)
 [facility]  PTNS VISIT  CC:  Overactive bladder  58 y.o. with refractory overactive bladder who presents for percutaneous tibial nerve stimulation. The patient presents for PTNS session # 5.   Procedure: The patient was placed in the sitting position and the right lower extremity was prepped in the usual fashion. The PTNS needle was then inserted at a 60 degree angle, 5 cm cephalad and 2 cm posterior to the medial malleolus. The PTNS unit was then programmed and an optimal response was noted at 8 milliamps. The PTNS stimulation was then performed at this setting for 30 minutes without incident and the patient tolerated the procedure well. The needle was removed and hemostasis was noted.   The pt will return in 1 week for PTNS session # 6. All questions were answered.

## 2024-02-24 ENCOUNTER — Encounter: Payer: Self-pay | Admitting: Obstetrics and Gynecology

## 2024-02-24 ENCOUNTER — Ambulatory Visit (INDEPENDENT_AMBULATORY_CARE_PROVIDER_SITE_OTHER): Admitting: Obstetrics and Gynecology

## 2024-02-24 VITALS — BP 86/57 | HR 71

## 2024-02-24 DIAGNOSIS — N3941 Urge incontinence: Secondary | ICD-10-CM

## 2024-02-24 DIAGNOSIS — R948 Abnormal results of function studies of other organs and systems: Secondary | ICD-10-CM | POA: Diagnosis not present

## 2024-02-24 DIAGNOSIS — R350 Frequency of micturition: Secondary | ICD-10-CM

## 2024-02-24 DIAGNOSIS — R339 Retention of urine, unspecified: Secondary | ICD-10-CM

## 2024-02-24 DIAGNOSIS — N3281 Overactive bladder: Secondary | ICD-10-CM

## 2024-02-24 DIAGNOSIS — R151 Fecal smearing: Secondary | ICD-10-CM

## 2024-02-24 LAB — POCT URINALYSIS DIPSTICK
Bilirubin, UA: NEGATIVE
Blood, UA: NEGATIVE
Glucose, UA: NEGATIVE
Ketones, UA: NEGATIVE
Leukocytes, UA: NEGATIVE
Nitrite, UA: NEGATIVE
Protein, UA: NEGATIVE
Spec Grav, UA: <=1.005 — AB (ref 1.010–1.025)
Urobilinogen, UA: NEGATIVE U/dL — AB
pH, UA: 7.5 (ref 5.0–8.0)

## 2024-02-24 NOTE — Patient Instructions (Addendum)
 We will practice in and out catheterizing. I want you to just get 4-5 times of data points so we can evaluate the retention. Document what you urinate first and then how much you cathed for.    Please try to do timed voids (Voiding every 2-3 hours on a schedule)  Double voids; urinating and then standing, doing some deep breathing and then sitting to pee again. Please make sure you sit to pee and do not hover to pee.   Continue with plan to start PT. I will be sending them some information from your bladder testing.   Plan for the kidney ultrasound.   Taking Care of Yourself after Urodynamics  Drink plenty of water for a day or two following your procedure. Try to have about 8 ounces (one cup) at a time, and do this 6 times or more per day unless you have fluid restrictitons AVOID irritative beverages such as coffee, tea, soda, alcoholic or citrus drinks for a day or two, as this may cause burning with urination.  You may experience some discomfort or a burning sensation with urination after having this procedure. You can use over the counter Azo or pyridium to help with burning and follow the instructions on the packaging. If it does not improve within 1-2 days, or other symptoms appear (fever, chills, or difficulty urinating) call the office to speak to a nurse.  You may return to normal daily activities such as work, school, driving, exercising and housework on the day of the procedure.

## 2024-02-24 NOTE — Progress Notes (Signed)
 [facility]  PTNS VISIT  CC:  Overactive bladder  58 y.o. with refractory overactive bladder who presents for percutaneous tibial nerve stimulation. The patient presents for PTNS session # 6.   Procedure: The patient was placed in the sitting position and the right lower extremity was prepped in the usual fashion. The PTNS needle was then inserted at a 60 degree angle, 5 cm cephalad and 2 cm posterior to the medial malleolus. The PTNS unit was then programmed and an optimal response was noted at 6 milliamps. The PTNS stimulation was then performed at this setting for 30 minutes without incident and the patient tolerated the procedure well. The needle was removed and hemostasis was noted.   The pt will return in 1 week for PTNS session # 7. All questions were answered.

## 2024-02-24 NOTE — Progress Notes (Signed)
 [facility] Urodynamics Procedure  Referring Physician: Helyn Lobstein, MD Date of Procedure: 02/24/2024  Laurie Terry is a 58 y.o. female who presents for urodynamic evaluation. Indication(s) for study: Feeling of incomplete bladder emptying, Mixed incontinence   Vital Signs: BP (!) 86/57 (Cuff Size: Large)   Pulse 71   Laboratory Results: A catheterized urine specimen revealed:  POC urine:  Lab Results  Component Value Date   COLORU yellow 02/24/2024   CLARITYU clear 02/24/2024   GLUCOSEUR Negative 02/24/2024   BILIRUBINUR Negative 02/24/2024   KETONESU Negative 02/24/2024   SPECGRAV <=1.005 (A) 02/24/2024   RBCUR Negative 02/24/2024   PHUR 7.5 02/24/2024   PROTEINUR Negative 02/24/2024   UROBILINOGEN negative (A) 02/24/2024   LEUKOCYTESUR Negative 02/24/2024     Voiding Diary: Not done  Procedure Timeout:  The correct patient was verified and the correct procedure was verified. The patient was in the correct position and safety precautions were reviewed based on at the patient's history.  Urodynamic Procedure A 50F dual lumen urodynamics catheter was placed under sterile conditions into the patient's bladder. A 50F catheter was placed into the rectum in order to measure abdominal pressure. EMG patches were placed in the appropriate position.  All connections were confirmed and calibrations/adjusted made. Saline was instilled into the bladder through the dual lumen catheters.  Cough/valsalva pressures were measured periodically during filling.  Patient was allowed to void.  The bladder was then emptied of its residual.  UROFLOW: Revealed a Qmax of 15 mL/sec.  She voided 82 mL and had a residual of 160 mL.  It was a normal pattern and represented normal habits though interpretation limited due to low voided volume.  CMG: This was performed with sterile water in the sitting position at a fill rate of 30 mL/min.    First sensation of fullness was 88 mLs,   First urge was 102 mLs,  Strong urge was 249 mLs and  Capacity was 824 mLs  Stress incontinence was not demonstrated Highest negative CLPP was 80 cmH20 at 251 ml. Highest negative VLPP was 62 cmH20at 278 ml.   Detrusor function was normal, with phasic contractions seen when patient was standing.  The first occurred at 279 mL to 9 cm of water and was not associated with leakage.  Compliance:  Normal. End fill detrusor pressure was 21cmH20.  Calculated compliance was 2mL/cmH20  UPP: MUCP without barrier reduction was 62 cm of water.    MICTURITION STUDY: Voiding was performed without reduction in the sitting position.  Pdet at Qmax was 31 cm of water.  Qmax was 16 mL/sec.  It was a prolonged normal pattern.  She voided 399 mL and had a residual of 425 mL.  It was a volitional void, sustained detrusor contraction was present and abdominal straining was not present  EMG: This was performed with patches.  She had voluntary contractions, recruitment with fill was present and urethral sphincter was not relaxed with void.  The details of the procedure with the study tracings have been scanned into EPIC.   Urodynamic Impression:  1. Sensation was normal; capacity was increased 2. Stress Incontinence was not demonstrated at normal pressures; 3. Detrusor Overactivity was demonstrated without leakage. 4. Emptying was dysfunctional with a elevated PVR ( ) , a sustained detrusor contraction present,  abdominal straining not present, dyssynergic urethral sphincter activity on EMG.  Plan: -Patient is scheduled to start PT soon -Patient will in and out cath to obtain at least 4-5 data points so  we can see on a normal day how much she is retaining.  -Patient to attempt double voiding and timed voids (every 2-3 hours) -Patient to obtain US  imaging of the kidney's to rule out hydronephrosis related to potential retention Patient to follow up with Dr. Frutoso Jing to discuss further options. UDS  was quickly reviewed with Dr. Frutoso Jing who reported she may consider pelvic floor botox if other measures are not enough to assist with bladder emptying. Can also consider SNM in future if her symptoms are not improving.

## 2024-02-26 ENCOUNTER — Other Ambulatory Visit (HOSPITAL_COMMUNITY)
Admission: RE | Admit: 2024-02-26 | Discharge: 2024-02-26 | Disposition: A | Discharge status: Home / Self Care | Source: Ambulatory Visit | Attending: Obstetrics and Gynecology | Admitting: Obstetrics and Gynecology

## 2024-02-26 ENCOUNTER — Ambulatory Visit (INDEPENDENT_AMBULATORY_CARE_PROVIDER_SITE_OTHER)

## 2024-02-26 ENCOUNTER — Ambulatory Visit

## 2024-02-26 VITALS — BP 117/80 | HR 60 | Temp 97.9°F

## 2024-02-26 DIAGNOSIS — R319 Hematuria, unspecified: Secondary | ICD-10-CM

## 2024-02-26 DIAGNOSIS — R82998 Other abnormal findings in urine: Secondary | ICD-10-CM

## 2024-02-26 DIAGNOSIS — R350 Frequency of micturition: Secondary | ICD-10-CM

## 2024-02-26 LAB — POCT URINALYSIS DIP (CLINITEK)
Bilirubin, UA: NEGATIVE
Glucose, UA: NEGATIVE mg/dL
Ketones, POC UA: NEGATIVE mg/dL
Nitrite, UA: NEGATIVE
Spec Grav, UA: >=1.03 — AB (ref 1.010–1.025)
Urobilinogen, UA: 0.2 U/dL
pH, UA: 6 (ref 5.0–8.0)

## 2024-02-26 LAB — URINALYSIS, ROUTINE W REFLEX MICROSCOPIC
Bilirubin Urine: NEGATIVE
Glucose, UA: NEGATIVE mg/dL
Ketones, ur: NEGATIVE mg/dL
Nitrite: NEGATIVE
Protein, ur: 30 mg/dL — AB
Specific Gravity, Urine: 1.027 (ref 1.005–1.030)
WBC, UA: >50 WBC/hpf (ref 0–5)
pH: 5 (ref 5.0–8.0)

## 2024-02-26 MED ORDER — NITROFURANTOIN MONOHYD MACRO 100 MG PO CAPS: 100.0000 mg | ORAL_CAPSULE | Freq: Two times a day (BID) | ORAL | 0 refills | Status: DC

## 2024-02-26 NOTE — Patient Instructions (Signed)
 Your Urine dip that was done in office was Positive. I am sending the urine off for culture and you can take AZO over the counter for your discomfort.  We have ordered Macrobid  for you to take while we wait for your culture results, hopefully this gives you some relief. We will contact you when the results are back between 3-5 days.  If a different antibiotic is needed we will sent the order to the pharmacy and you will be notified. If you have any questions or concerns please feel free to call us  at [phone]

## 2024-02-26 NOTE — Progress Notes (Signed)
 Laurie Terry is a 58 y.o. female arrived today with UTI sx.  Per Dr. Sunnie England protocol: A urine specimen was collected and POCT Urine was done and urine culture sent to the lab. POCT Urine was POSITIVE for Blood and leukocytes. Pt was notified and prescription sent to the preferred pharmacy.

## 2024-02-28 LAB — URINE CULTURE: Culture: >=100000 — AB

## 2024-02-29 ENCOUNTER — Encounter: Payer: Self-pay | Admitting: Obstetrics and Gynecology

## 2024-03-01 ENCOUNTER — Ambulatory Visit: Payer: Self-pay | Admitting: Obstetrics and Gynecology

## 2024-03-01 ENCOUNTER — Ambulatory Visit (INDEPENDENT_AMBULATORY_CARE_PROVIDER_SITE_OTHER): Admitting: Obstetrics and Gynecology

## 2024-03-01 ENCOUNTER — Ambulatory Visit (HOSPITAL_BASED_OUTPATIENT_CLINIC_OR_DEPARTMENT_OTHER)
Admission: RE | Admit: 2024-03-01 | Discharge: 2024-03-01 | Disposition: A | Discharge status: Home / Self Care | Source: Ambulatory Visit | Attending: Obstetrics and Gynecology | Admitting: Obstetrics and Gynecology

## 2024-03-01 ENCOUNTER — Encounter: Payer: Self-pay | Admitting: Obstetrics and Gynecology

## 2024-03-01 VITALS — BP 98/65 | HR 66

## 2024-03-01 DIAGNOSIS — R339 Retention of urine, unspecified: Secondary | ICD-10-CM | POA: Diagnosis present

## 2024-03-01 DIAGNOSIS — N3281 Overactive bladder: Secondary | ICD-10-CM

## 2024-03-01 DIAGNOSIS — N3941 Urge incontinence: Secondary | ICD-10-CM

## 2024-03-01 DIAGNOSIS — R350 Frequency of micturition: Secondary | ICD-10-CM

## 2024-03-01 NOTE — Progress Notes (Signed)
 [facility]  PTNS VISIT  CC:  Overactive bladder  58 y.o. with refractory overactive bladder who presents for percutaneous tibial nerve stimulation. The patient presents for PTNS session # 7.   Procedure: The patient was placed in the sitting position and the right lower extremity was prepped in the usual fashion. The PTNS needle was then inserted at a 60 degree angle, 5 cm cephalad and 2 cm posterior to the medial malleolus. The PTNS unit was then programmed and an optimal response was noted at 9 milliamps. The PTNS stimulation was then performed at this setting for 30 minutes without incident and the patient tolerated the procedure well. The needle was removed and hemostasis was noted.   The pt will return in 1 week for PTNS session # 8. All questions were answered.

## 2024-03-04 ENCOUNTER — Ambulatory Visit: Attending: Obstetrics and Gynecology | Admitting: Physical Therapy

## 2024-03-04 ENCOUNTER — Encounter: Payer: Self-pay | Admitting: Physical Therapy

## 2024-03-04 ENCOUNTER — Other Ambulatory Visit: Payer: Self-pay

## 2024-03-04 DIAGNOSIS — M6281 Muscle weakness (generalized): Secondary | ICD-10-CM | POA: Diagnosis present

## 2024-03-04 DIAGNOSIS — R279 Unspecified lack of coordination: Secondary | ICD-10-CM | POA: Insufficient documentation

## 2024-03-04 DIAGNOSIS — R293 Abnormal posture: Secondary | ICD-10-CM | POA: Diagnosis present

## 2024-03-04 NOTE — Therapy (Signed)
 OUTPATIENT PHYSICAL THERAPY FEMALE PELVIC EVALUATION   Patient Name: Laurie Terry MRN: [id number] DOB:01/11/1966, 58 y.o., female Today's Date: 03/04/2024  END OF SESSION:  PT End of Session - 03/04/24 1013     Visit Number 1    Number of Visits 10    Date for PT Re-Evaluation 04/01/24    Authorization Type [employer]    Authorization - Number of Visits 20    PT Start Time 0800    PT Stop Time 0845    PT Time Calculation (min) 45 min    Activity Tolerance Patient tolerated treatment well    Behavior During Therapy [facility] for tasks assessed/performed          Past Medical History:  Diagnosis Date   Allergic rhinitis    Aortic aneurysm (HCC) 09/23/2020   Cancer (HCC)    Melanoma   Cervical dysplasia 2007   low-grade SIL, normal Pap smears since then   Family history of adverse reaction to anesthesia    mom has h/o post op nausea/vomiting   Intermittent palpitations    IUD    inserted 03/2002.04/06/2007, 06/03/2012   Ocular migraine    Osteomalacia    Pancreatitis    Seafood allergy    Past Surgical History:  Procedure Laterality Date   BIOPSY  01/21/2022   Procedure: BIOPSY;  Surgeon: Baldo Bonds, MD;  Location: [facility];  Service: Gastroenterology;;   Tillman Folks  2018   CHOLECYSTECTOMY N/A 04/09/2016   Procedure: LAPAROSCOPIC CHOLECYSTECTOMY ;  Surgeon: Enid Harry, MD;  Location: [facility] OR;  Service: General;  Laterality: N/A;   ERCP  04/11/2016   ERCP with pancreatic stent and failed CBD stent after needle knife and conventional sphincterotomy   ERCP N/A 04/12/2016   Procedure: ENDOSCOPIC RETROGRADE CHOLANGIOPANCREATOGRAPHY (ERCP);  Surgeon: Ozell Blunt, MD;  Location: [facility] ENDOSCOPY;  Service: Endoscopy;  Laterality: N/A;   ERCP N/A 01/15/2022   Procedure: ENDOSCOPIC RETROGRADE CHOLANGIOPANCREATOGRAPHY (ERCP);  Surgeon: Ozell Blunt, MD;  Location: [facility];  Service: Gastroenterology;  Laterality: N/A;   FLEXIBLE SIGMOIDOSCOPY N/A  01/21/2022   Procedure: FLEXIBLE SIGMOIDOSCOPY;  Surgeon: Baldo Bonds, MD;  Location: [facility];  Service: Gastroenterology;  Laterality: N/A;   FOOT SURGERY     toenail   INTRAUTERINE DEVICE INSERTION  08/07/2017   Mirena    IR EXCHANGE BILIARY DRAIN  02/11/2022   IR RADIOLOGIST EVAL & MGMT  02/08/2022   IR RADIOLOGIST EVAL & MGMT  02/20/2022   IR RADIOLOGIST EVAL & MGMT  02/28/2022   removal of melanoma     REMOVAL OF STONES  01/15/2022   Procedure: REMOVAL OF STONES;  Surgeon: Ozell Blunt, MD;  Location: [facility];  Service: Gastroenterology;;   [address]  01/15/2022   Procedure: [address];  Surgeon: Ozell Blunt, MD;  Location: [facility];  Service: Gastroenterology;;   Patient Active Problem List   Diagnosis Date Noted   Dilatation of aorta (HCC) 01/24/2023   Perinephric abscess 03/03/2022   Norovirus 02/15/2022   Dry cough 02/02/2022   Iatrogenic subdiaphragmatic abscess (HCC) 02/02/2022   Pleural effusion on right    Post-ERCP acute pancreatitis 01/16/2022   GERD without esophagitis 01/16/2022   Choledocholithiasis 01/16/2022   Adverse effect of other drugs, medicaments and biological substances, subsequent encounter 05/23/2021   Other allergic rhinitis 05/23/2021   Allergic conjunctivitis of both eyes 05/23/2021   History of asthma 05/23/2021   Oral allergy syndrome, subsequent encounter 05/23/2021   Family history of bicuspid aortic valve 11/28/2020   Other hyperlipidemia 11/28/2020  PAC (premature atrial contraction) 10/30/2020   Postoperative bile leak 04/12/2016   Acute hypokalemia 04/12/2016   Paroxysmal SVT (supraventricular tachycardia) (HCC) 04/12/2016   Bile leak, postoperative 04/12/2016   S/P laparoscopic cholecystectomy    History of Gallstone pancreatitis 04/07/2016   Pancreatitis due to biliary obstruction 04/07/2016   Cancer (HCC)    Osteopenia     PCP: Rosanne Commodore   REFERRING PROVIDER: Zuleta, Kaitlin G, NP   REFERRING DIAG:  R35.0 (ICD-10-CM) - Urinary frequency R82.998 (ICD-10-CM) - Leukocytes in urine N39.41 (ICD-10-CM) - Urge incontinence of urine R15.1 (ICD-10-CM) - Fecal smearing  THERAPY DIAG:  Muscle weakness (generalized)  Unspecified lack of coordination  Abnormal posture  Rationale for Evaluation and Treatment: Rehabilitation  ONSET DATE: within the last year or so   SUBJECTIVE:                                                                                                                                                                                           SUBJECTIVE STATEMENT: On a weekly basis, patient experiences fecal smearing. She is doing PTNS therapy with her referring provider and this is helping. She has urgency when she walks into the bathroom. She is experiencing small pieces of stool in her underwear when she pulls her pants down. This does not happen every day, but it does happen weekly. Fluid intake: coffee (decaf, low acid); drinks minimal water; couple of cups of hot herbal tea   PAIN:  Are you having pain? No NPRS scale: 0/10  PRECAUTIONS: None  RED FLAGS: None   WEIGHT BEARING RESTRICTIONS: No  FALLS:  Has patient fallen in last 6 months? 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  Sexual abuse: No  BOWEL MOVEMENT: Pain with bowel movement: No Type of bowel movement:Type (Bristol Stool Scale) 4-5, Frequency 3-4x/week, Strain no, and Splinting no Fully empty rectum: Yes: it depends on if its a large BM or not  Leakage: Yes: on days when  she is not having a bowel movement  Pads: No Fiber supplement/laxative Yes - consumes natural fiber in her foods   URINATION: Pain with urination: No Fully empty bladder: Yes: testing has  shown otherwise though - she feels like she empties fully  Stream: Strong Urgency: Yes  Frequency: within normal limits - gets up 1-3x/night  Leakage: Walking to the bathroom Pads: No  INTERCOURSE: currently sexually active   Ability to have vaginal penetration Yes  Pain with intercourse: none DrynessYes - has a vaginal estrogen cream  Climax: yes Marinoff Scale: 1/3  PREGNANCY: Vaginal deliveries 4, 9lb babies Tearing No Episiotomy Yes  C-section deliveries 0 Currently pregnant No  PROLAPSE: None  OBJECTIVE:  Note: Objective measures were completed at Evaluation unless otherwise noted.  PATIENT SURVEYS:  PFIQ-7: 45  COGNITION: Overall cognitive status: Within functional limits for tasks assessed     SENSATION: Light touch: Appears intact  LUMBAR SPECIAL TESTS:  Single leg stance test: Positive  FUNCTIONAL TESTS:  Squat test: bilateral dynamic knee valgus with loading, general lumbopelvic stiffness present   GAIT: Assistive device utilized: None Comments: mild trendelenburg gait pattern with ambulation   POSTURE: rounded shoulders and forward head  LUMBARAROM/PROM: within normal limits   A/PROM A/PROM  eval  Flexion 100%  Extension 100%  Right lateral flexion 100%  Left lateral flexion 100%  Right rotation 100%  Left rotation 100%   (Blank rows = not tested)  LOWER EXTREMITY ROM: within functional limits   LOWER EXTREMITY MMT: 4/5 bilateral knees and hips grossly  PALPATION:   General: no significant tenderness to glute medius/max/piriformis/coccyx/sacrum  Pelvic Alignment: within normal limits   Abdominal: upper chest breathing and abdominal bracing present at rest, patient demonstrates sufficient lower rib mobility for deep diaphragmatic breathing                  External Perineal Exam: dryness present around EAS, anal wink reflex positive                             Internal Pelvic Floor: Patient fully consents to today's rectal examination of her pelvic floor musculature. She had no pain during today's examination. She demonstrates weakness at external and internal anal sphincters, only able to achieve a weak 3/5 contraction in sidelying. She is able to create a stronger (4/5) contraction when she pairs an exhale with her contraction. She is stiff throughout the rectal musculature, most likely from not utilizing the full range of motion of the rectal muscles. No increase in overall tone following assessment. No fecal smearing present on glove.  Patient confirms identification and approves PT to assess internal pelvic floor and treatment Yes No emotional/communication barriers or cognitive limitation. Patient is motivated to learn. Patient understands and agrees with treatment goals and plan. PT explains patient will be examined in standing, sitting, and lying down to see how their muscles and joints work. When they are ready, they will be asked to remove their underwear so PT can examine their perineum. The patient is also given the option of providing their own chaperone as one is not provided in our facility. The patient also has the right and is explained the right to defer or refuse any part of the evaluation or treatment including the internal exam. With the patient's consent, PT will use one gloved finger to gently assess the muscles of the pelvic floor, seeing how well it contracts and relaxes and if there is muscle symmetry. After, the patient will get dressed and PT and patient will discuss exam findings and plan of care. PT and patient discuss plan of care, schedule, attendance policy and HEP activities.  PELVIC MMT:   MMT eval  Vaginal   Internal Anal Sphincter 3/5  External Anal Sphincter 3/5  Puborectalis 3/5  Diastasis Recti   (Blank  rows = not tested)       TONE: Decreased throughout superficial and deep rectal musculature.  PROLAPSE: N/A  TODAY'S TREATMENT:                                                                                                                              DATE:   EVAL 03/04/24: Examination completed, findings reviewed, pt educated on POC, HEP, and self care. Pt motivated to participate in PT and agreeable to attempt recommendations.  Neuro re-ed: Hooklying diaphragmatic breathing + pelvic floor lengthening with inhalation and shortening with exhalation 3x10  Self care: Relative anatomy and the connection between the diaphragm and pelvic floor, how to contract the pelvic floor vs relaxing the musculature  PATIENT EDUCATION:  Education details: Relative anatomy and the connection between the diaphragm and pelvic floor, how to contract the pelvic floor vs relaxing the musculature Person educated: Patient Education method: Explanation, Demonstration, Tactile cues, Verbal cues, and Handouts Education comprehension: verbalized understanding, returned demonstration, verbal cues required, tactile cues required, and needs further education  HOME EXERCISE PROGRAM: Access Code: [email or web address] URL: [email or web address] Date: 03/04/2024 Prepared by: Robbin Chill  Exercises - Supine Pelvic Floor Contraction  - 1 x daily - 7 x weekly - 3 sets - 10 reps  ASSESSMENT:  CLINICAL IMPRESSION: Patient is a 58 y.o. female who was seen today for physical therapy evaluation and treatment for urinary frequency/urge and fecal incontinence. She started noticing the fecal incontinence within the last year, and she sometimes experiences this when exercising, but it can also be randomly. She reports no pelvic pain at rest. Intercourse is slightly uncomfortable due to dryness, and we discussed the use of topical estrogen for this management, which she has at home already. Patient fully consents to today's  rectal examination of her pelvic floor musculature. She had no pain during today's examination. She demonstrates weakness at external and internal anal sphincters, only able to achieve a weak 3/5 contraction in sidelying. She is able to create a stronger (4/5) contraction when she pairs an exhale with her contraction. She is stiff throughout the rectal musculature, most likely from not utilizing the full range of motion of the rectal muscles. No increase in overall tone following assessment. No fecal smearing present on glove following exam. Overall, patient tolerated session well and Pt would benefit from additional PT to further address deficits.    OBJECTIVE IMPAIRMENTS: decreased coordination, decreased endurance, decreased mobility, decreased ROM, and decreased strength.   ACTIVITY LIMITATIONS: continence  PARTICIPATION LIMITATIONS: N/A  PERSONAL FACTORS: Age, Past/current experiences, and Time since onset of injury/illness/exacerbation are also affecting patient's functional outcome.   REHAB POTENTIAL: Good  CLINICAL DECISION MAKING: Stable/uncomplicated  EVALUATION COMPLEXITY: Low   GOALS: Goals reviewed with patient? Yes  SHORT TERM GOALS: Target date:  04/01/2024  Pt will be independent with HEP.  Baseline: Goal status: INITIAL  2.  Pt will be independent with diaphragmatic breathing and down training activities in order to improve pelvic floor relaxation. Baseline:  Goal status: INITIAL  3.  Pt will be independent with use of squatty potty, relaxed toileting mechanics, and improved bowel movement techniques in order to increase ease of bowel movements and complete evacuation.   Baseline:  Goal status: INITIAL  4.  Pt will be independent with the knack, urge suppression technique, and double voiding in order to improve bladder habits and decrease urinary incontinence.   Baseline:  Goal status: INITIAL   LONG TERM GOALS: Target date: 09/03/2024  Pt will be independent  with advanced HEP.  Baseline:  Goal status: INITIAL  2.  Pt to demonstrate improved coordination of pelvic floor and breathing mechanics with 10# squat with appropriate synergistic patterns to decrease leakage at least 75% of the time for improved ability to complete a 30 minute workout with strain at pelvic floor and symptoms.   Baseline:  Goal status: INITIAL  3.  Pt will report her BMs are complete due to improved bowel habits and evacuation techniques to improve quality of life. Baseline:  Goal status: INITIAL  4.  Pt will be able to teach back and utilize urge suppression technique in order to help reduce number of trips to the bathroom and to decrease urge urinary incontinence when trying to make it to the bathroom. Baseline:  Goal status: INITIAL  PLAN:  PT FREQUENCY: 1-2x/week  PT DURATION: 12 weeks  PLANNED INTERVENTIONS: 97110-Therapeutic exercises, 97530- Therapeutic activity, 97112- Neuromuscular re-education, 97535- Self Care, 96045- Manual therapy, Patient/Family education, Taping, Joint mobilization, Spinal mobilization, Scar mobilization, Cryotherapy, and Moist heat  PLAN FOR NEXT SESSION: continued pelvic lfoor AROM training in seated position, introduce toileting mechanics for complete emptying, manual to rectal/vaginal musculature, education surrounding urge suppression   Marni Sins, PT 03/04/2024, 10:15 AM

## 2024-03-05 ENCOUNTER — Ambulatory Visit: Admitting: Obstetrics and Gynecology

## 2024-03-10 ENCOUNTER — Ambulatory Visit (INDEPENDENT_AMBULATORY_CARE_PROVIDER_SITE_OTHER): Admitting: Obstetrics and Gynecology

## 2024-03-10 VITALS — BP 107/69 | HR 76

## 2024-03-10 DIAGNOSIS — R151 Fecal smearing: Secondary | ICD-10-CM

## 2024-03-10 DIAGNOSIS — N3281 Overactive bladder: Secondary | ICD-10-CM

## 2024-03-10 DIAGNOSIS — N3941 Urge incontinence: Secondary | ICD-10-CM

## 2024-03-10 DIAGNOSIS — R350 Frequency of micturition: Secondary | ICD-10-CM

## 2024-03-10 NOTE — Progress Notes (Signed)
 [facility]  PTNS VISIT  CC:  Overactive bladder  58 y.o. with refractory overactive bladder who presents for percutaneous tibial nerve stimulation. The patient presents for PTNS session # 8.   Procedure: The patient was placed in the sitting position and the right lower extremity was prepped in the usual fashion. The PTNS needle was then inserted at a 60 degree angle, 5 cm cephalad and 2 cm posterior to the medial malleolus. The PTNS unit was then programmed and an optimal response was noted at 10 milliamps. The PTNS stimulation was then performed at this setting for 30 minutes without incident and the patient tolerated the procedure well. The needle was removed and hemostasis was noted.   The pt will return in 1 week for PTNS session # 9. All questions were answered.

## 2024-03-15 ENCOUNTER — Encounter: Payer: Self-pay | Admitting: Obstetrics and Gynecology

## 2024-03-15 ENCOUNTER — Ambulatory Visit (INDEPENDENT_AMBULATORY_CARE_PROVIDER_SITE_OTHER): Admitting: Obstetrics and Gynecology

## 2024-03-15 VITALS — BP 95/65 | HR 76

## 2024-03-15 DIAGNOSIS — R159 Full incontinence of feces: Secondary | ICD-10-CM | POA: Diagnosis not present

## 2024-03-15 DIAGNOSIS — R339 Retention of urine, unspecified: Secondary | ICD-10-CM

## 2024-03-15 DIAGNOSIS — N3281 Overactive bladder: Secondary | ICD-10-CM

## 2024-03-15 NOTE — Progress Notes (Signed)
 [facility] Return Visit  SUBJECTIVE  History of Present Illness: Laurie Terry is a 58 y.o. female seen in follow-up for OAB, incomplete bladder emptying and bowel leakage. She is undergoing PTNS but also wants to consider SNM. PTNS has improved her bladder urgency symptoms. Recently has also started pelvic PT.   She was noted to have incomplete bladder emptying on UDS. She was taught to self cath so she could keep a diary at home but developed a UTI following UDS so has not done it yet.   She reports she is having bowel leakage about once a week. She is taking fiber (wheat bran and sometimes metamucil) and this has helped some. But if she has an incomplete BM, then will have leakage throughout the day.    Urodynamic Impression:  1. Sensation was normal; capacity was increased 2. Stress Incontinence was not demonstrated at normal pressures; 3. Detrusor Overactivity was demonstrated without leakage. 4. Emptying was dysfunctional with a elevated PVR ( ) , a sustained detrusor contraction present,  abdominal straining not present, dyssynergic urethral sphincter activity on EMG.  Renal US  IMPRESSION: 1. Normal sonographic appearance of the kidneys. No hydronephrosis. 2. Prominent postvoid residual of 74.6 mL within the bladder.  Past Medical History: Patient  has a past medical history of Allergic rhinitis, Aortic aneurysm (HCC) (09/23/2020), Cancer [facility]), Cervical dysplasia (2007), Family history of adverse reaction to anesthesia, Intermittent palpitations, IUD, Ocular migraine, Osteomalacia, Pancreatitis, and Seafood allergy.   Past Surgical History: She  has a past surgical history that includes Foot surgery; removal of melanoma; Intrauterine device insertion (08/07/2017); Cholecystectomy (N/A, 04/09/2016); ERCP (04/11/2016); ERCP (N/A, 04/12/2016); Bunionectomy (2018); ERCP (N/A, 01/15/2022); sphincterotomy (01/15/2022); removal of stones (01/15/2022); Flexible  sigmoidoscopy (N/A, 01/21/2022); biopsy (01/21/2022); IR Radiologist Eval & Mgmt (02/08/2022); IR EXCHANGE BILIARY DRAIN (02/11/2022); IR Radiologist Eval & Mgmt (02/20/2022); and IR Radiologist Eval & Mgmt (02/28/2022).   Medications: She has a current medication list which includes the following prescription(s): adapalene, epinephrine , estradiol , famotidine , famotidine , fluticasone , metoprolol  tartrate, nitrofurantoin  (macrocrystal-monohydrate), and nitrofurantoin  (macrocrystal-monohydrate).   Allergies: Patient is allergic to contrast media [iodinated contrast media], iodine, other, augmentin  [amoxicillin -pot clavulanate], avocado, levaquin [levofloxacin], levofloxacin, pantoprazole , vancomycin , and colestipol  hcl.   Social History: Patient  reports that she has quit smoking. She has been exposed to tobacco smoke. She has never used smokeless tobacco. She reports current alcohol use. She reports that she does not use drugs.     OBJECTIVE     Physical Exam: Vitals:   03/15/24 0814  BP: 95/65  Pulse: 76   Gen: No apparent distress, A&O x 3.  PTNS VISIT  PTNS session # 9.   Procedure: The patient was placed in the sitting position and the right lower extremity was prepped in the usual fashion. The PTNS needle was then inserted at a 60 degree angle, 5 cm cephalad and 2 cm posterior to the medial malleolus. The PTNS unit was then programmed and an optimal response was noted at 6 milliamps. The PTNS stimulation was then performed at this setting for 30 minutes without incident and the patient tolerated the procedure well. The needle was removed and hemostasis was noted.      ASSESSMENT AND PLAN    Laurie Terry is a 58 y.o. with:  1. Overactive bladder   2. Incontinence of feces, unspecified fecal incontinence type   3. Incomplete bladder emptying     - Return 1 week for PTNS - We discussed the role of sacral neuromodulation and how  it works. It requires a test phase, and documentation of  bladder and bowel function via diary. After a successful test period, a permanent wire and generator are placed in the OR.  The goal of this therapy is at least a 50% improvement in symptoms. It is NOT realistic to expect a 100% cure.  We reviewed the fact that about 30% of patients fail the test phase and are not candidates for permanent generator placement.  Handouts provided to her to review about the device.  - Recommended keeping a self cath PVR diary to further evaluate emptying. She does not feel she has an issue urinating at home so it is unclear if the testing results were situational.  - We also discussed using a trial of miralax  to ensure more complete bowel movements.   Laurie LOISE Caper, MD  Time spent: I spent 30 minutes dedicated to the care of this patient on the date of this encounter to include pre-visit review of records, face-to-face time with the patient and post visit documentation.

## 2024-03-16 ENCOUNTER — Ambulatory Visit: Admitting: Obstetrics and Gynecology

## 2024-03-22 ENCOUNTER — Ambulatory Visit: Admitting: Obstetrics

## 2024-03-25 ENCOUNTER — Ambulatory Visit: Admitting: Obstetrics and Gynecology

## 2024-03-29 ENCOUNTER — Ambulatory Visit (INDEPENDENT_AMBULATORY_CARE_PROVIDER_SITE_OTHER): Admitting: Obstetrics and Gynecology

## 2024-03-29 ENCOUNTER — Encounter: Payer: Self-pay | Admitting: Obstetrics and Gynecology

## 2024-03-29 VITALS — BP 107/67 | HR 86

## 2024-03-29 DIAGNOSIS — R339 Retention of urine, unspecified: Secondary | ICD-10-CM

## 2024-03-29 DIAGNOSIS — N3281 Overactive bladder: Secondary | ICD-10-CM | POA: Diagnosis not present

## 2024-03-29 DIAGNOSIS — R159 Full incontinence of feces: Secondary | ICD-10-CM

## 2024-03-29 NOTE — Progress Notes (Signed)
 [facility]  PTNS VISIT  CC:  Overactive bladder  58 y.o. with refractory overactive bladder who presents for percutaneous tibial nerve stimulation. The patient presents for PTNS session # 10.   Procedure: The patient was placed in the sitting position and the right lower extremity was prepped in the usual fashion. The PTNS needle was then inserted at a 60 degree angle, 5 cm cephalad and 2 cm posterior to the medial malleolus. The PTNS unit was then programmed and an optimal response was noted at 9 milliamps. The PTNS stimulation was then performed at this setting for 30 minutes without incident and the patient tolerated the procedure well. The needle was removed and hemostasis was noted.   The pt will return in 1 week for PTNS session # 11. All questions were answered.

## 2024-03-29 NOTE — Patient Instructions (Signed)
 Magnesium  butter may be helpful for your feet cramping

## 2024-03-31 ENCOUNTER — Other Ambulatory Visit: Payer: Self-pay | Admitting: Obstetrics and Gynecology

## 2024-03-31 DIAGNOSIS — Z1231 Encounter for screening mammogram for malignant neoplasm of breast: Secondary | ICD-10-CM

## 2024-04-05 ENCOUNTER — Encounter: Payer: Self-pay | Admitting: Obstetrics and Gynecology

## 2024-04-05 ENCOUNTER — Ambulatory Visit (INDEPENDENT_AMBULATORY_CARE_PROVIDER_SITE_OTHER): Admitting: Obstetrics and Gynecology

## 2024-04-05 DIAGNOSIS — N3281 Overactive bladder: Secondary | ICD-10-CM

## 2024-04-05 DIAGNOSIS — R159 Full incontinence of feces: Secondary | ICD-10-CM

## 2024-04-05 NOTE — Progress Notes (Signed)
 [facility]  PTNS VISIT  CC:  Overactive bladder  58 y.o. with refractory overactive bladder who presents for percutaneous tibial nerve stimulation. The patient presents for PTNS session # 11.   Procedure: The patient was placed in the sitting position and the right lower extremity was prepped in the usual fashion. The PTNS needle was then inserted at a 60 degree angle, 5 cm cephalad and 2 cm posterior to the medial malleolus. The PTNS unit was then programmed and an optimal response was noted at 9 milliamps. The PTNS stimulation was then performed at this setting for 30 minutes without incident and the patient tolerated the procedure well. The needle was removed and hemostasis was noted.   The pt will return in 1 week for PTNS session # 12. All questions were answered.

## 2024-04-06 ENCOUNTER — Ambulatory Visit: Attending: Internal Medicine | Admitting: Internal Medicine

## 2024-04-06 ENCOUNTER — Encounter: Payer: Self-pay | Admitting: Internal Medicine

## 2024-04-06 VITALS — BP 101/69 | HR 71 | Ht 65.0 in | Wt 156.0 lb

## 2024-04-06 DIAGNOSIS — I471 Supraventricular tachycardia, unspecified: Secondary | ICD-10-CM

## 2024-04-06 DIAGNOSIS — I77819 Aortic ectasia, unspecified site: Secondary | ICD-10-CM

## 2024-04-06 DIAGNOSIS — I491 Atrial premature depolarization: Secondary | ICD-10-CM | POA: Diagnosis not present

## 2024-04-06 DIAGNOSIS — E7849 Other hyperlipidemia: Secondary | ICD-10-CM | POA: Diagnosis not present

## 2024-04-06 NOTE — Patient Instructions (Signed)
 Medication Instructions:  Your physician recommends that you continue on your current medications as directed. Please refer to the Current Medication list given to you today.  *If you need a refill on your cardiac medications before your next appointment, please call your pharmacy*  Lab Work: Fasting Lipid Panel and Lpa at any [facility]   If you have labs (blood work) drawn today and your tests are completely normal, you will receive your results only by: MyChart Message (if you have MyChart) OR A paper copy in the mail If you have any lab test that is abnormal or we need to change your treatment, we will call you to review the results.  Testing/Procedures: March Echo  Your physician has requested that you have an echocardiogram. Echocardiography is a painless test that uses sound waves to create images of your heart. It provides your doctor with information about the size and shape of your heart and how well your heart's chambers and valves are working. This procedure takes approximately one hour. There are no restrictions for this procedure. Please do NOT wear cologne, perfume, aftershave, or lotions (deodorant is allowed). Please arrive 15 minutes prior to your appointment time.  Please note: We ask at that you not bring children with you during ultrasound (echo/ vascular) testing. Due to room size and safety concerns, children are not allowed in the ultrasound rooms during exams. Our front office staff cannot provide observation of children in our lobby area while testing is being conducted. An adult accompanying a patient to their appointment will only be allowed in the ultrasound room at the discretion of the ultrasound technician under special circumstances. We apologize for any inconvenience.   Follow-Up: At [facility], you and your health needs are our priority.  As part of our continuing mission to provide you with exceptional heart care, our providers are all part of one  team.  This team includes your primary Cardiologist (physician) and Advanced Practice Providers or APPs (Physician Assistants and Nurse Practitioners) who all work together to provide you with the care you need, when you need it.  Your next appointment:   8 month(s)  Provider:   Stanly Leavens, MD

## 2024-04-06 NOTE — Progress Notes (Signed)
 Cardiology Office Note:    Date:  04/06/2024   ID:  Laurie Terry, DOB 06-May-1966, MRN [id number]  PCP:  [name], MD  [facility] Cardiologist:  Stanly Leavens MD [facility] Electrophysiologist:  None   CC: Follow up aorta  History of Present Illness:    Laurie Terry is a 58 y.o. female with a hx of Paroxysmal SVT, PACs, Gestational DM with fourth child, family history  of bicuspid aortic valve,  mld thoracic aortic dilation- contrast allergy.   2022: seen without significant palpitations and stable aortic size  2023: Stable MRA 11/29/21 (Aorta 4.1).  Had fevers and ERCP complicated by microperforation, complicated by biloma IR draining. 2024: Changed jobs.  Back to working out.  Laurie Terry is a 58 year old female with mild aortic dilation and paroxysmal supraventricular tachycardia who presents for routine follow-up.  She has a history of mild aortic dilation and her father has a history of aortopathy. She has undergone MRA assessments, with the last evaluation in 2023, and her echocardiogram in 2025 was within normal limits. She has a contrast allergy.  She has paroxysmal supraventricular tachycardia and premature atrial contractions. Earlier this year, she experienced irregular heartbeats, managed with metoprolol  for about a week. Symptoms flare up when overly tired or occasionally after caffeine intake. She avoids regular caffeine use to prevent triggering symptoms.  Her cholesterol levels have been elevated, with an increase noted in 2024. She previously managed to lower her cholesterol without medication before a complicated GI illness in 2023. She has gained weight post-menopause.  She frequently hears her heartbeat in her ears, a lifelong symptom. She has a family history of atrial fibrillation but has not been diagnosed with it herself. She is concerned about stroke risk, particularly as she approaches the age when her father experienced  strokes.   Past Medical History:  Diagnosis Date   Allergic rhinitis    Aortic aneurysm (HCC) 09/23/2020   Cancer (HCC)    Melanoma   Cervical dysplasia 2007   low-grade SIL, normal Pap smears since then   Family history of adverse reaction to anesthesia    mom has h/o post op nausea/vomiting   Intermittent palpitations    IUD    inserted 03/2002.04/06/2007, 06/03/2012   Ocular migraine    Osteomalacia    Pancreatitis    Seafood allergy     Past Surgical History:  Procedure Laterality Date   BIOPSY  01/21/2022   Procedure: BIOPSY;  Surgeon: Dianna Specking, MD;  Location: [facility];  Service: Gastroenterology;;   ROMAYNE  2018   CHOLECYSTECTOMY N/A 04/09/2016   Procedure: LAPAROSCOPIC CHOLECYSTECTOMY ;  Surgeon: Donnice Bury, MD;  Location: [facility] OR;  Service: General;  Laterality: N/A;   ERCP  04/11/2016   ERCP with pancreatic stent and failed CBD stent after needle knife and conventional sphincterotomy   ERCP N/A 04/12/2016   Procedure: ENDOSCOPIC RETROGRADE CHOLANGIOPANCREATOGRAPHY (ERCP);  Surgeon: Oliva Boots, MD;  Location: [facility] END[facility]COPY;  Service: Endoscopy;  Laterality: N/A;   ERCP N/A 01/15/2022   Procedure: ENDOSCOPIC RETROGRADE CHOLANGIOPANCREATOGRAPHY (ERCP);  Surgeon: Boots Oliva, MD;  Location: [facility];  Service: Gastroenterology;  Laterality: N/A;   FLEXIBLE SIGMOIDOSCOPY N/A 01/21/2022   Procedure: FLEXIBLE SIGMOIDOSCOPY;  Surgeon: Dianna Specking, MD;  Location: [facility];  Service: Gastroenterology;  Laterality: N/A;   FOOT SURGERY     toenail   INTRAUTERINE DEVICE INSERTION  08/07/2017   Mirena    IR EXCHANGE BILIARY DRAIN  02/11/2022   IR RADIOLOGIST  EVAL & MGMT  02/08/2022   IR RADIOLOGIST EVAL & MGMT  02/20/2022   IR RADIOLOGIST EVAL & MGMT  02/28/2022   removal of melanoma     REMOVAL OF STONES  01/15/2022   Procedure: REMOVAL OF STONES;  Surgeon: Rosalie Kitchens, MD;  Location: [facility];  Service: Gastroenterology;;   ANNETT   01/15/2022   Procedure: ANNETT;  Surgeon: Rosalie Kitchens, MD;  Location: [facility];  Service: Gastroenterology;;    Current Medications: Current Meds  Medication Sig   Adapalene 0.3 % gel Apply topically daily.   EPINEPHrine  0.3 mg/0.3 mL IJ SOAJ injection Inject 0.3 mg into the muscle as needed for anaphylaxis.   estradiol  (ESTRACE ) 0.1 MG/GM vaginal cream Place 0.5 g vaginally 2 (two) times a week. Place 0.5g nightly for two weeks then twice a week after   famotidine  (PEPCID ) 20 MG tablet Take 20 mg by mouth daily as needed for heartburn or indigestion.   famotidine  (PEPCID ) 40 MG tablet Take 40 mg by mouth 2 (two) times daily.   fluticasone  (FLONASE ) 50 MCG/ACT nasal spray Place 2 sprays into both nostrils daily.   metoprolol  tartrate (LOPRESSOR ) 25 MG tablet Take 1 tablet (25 mg total) by mouth daily as needed (palpitations).     Allergies:   Contrast media [iodinated contrast media], Iodine, Other, Augmentin  [amoxicillin -pot clavulanate], Avocado, Levaquin [levofloxacin], Levofloxacin, Pantoprazole , Vancomycin , and Colestipol  hcl   Social History   Socioeconomic History   Marital status: Married    Spouse name: Marcey Blumenthal   Number of children: 4   Years of education: Not on file   Highest education level: Not on file  Occupational History   Not on file  Tobacco Use   Smoking status: Former    Passive exposure: Past   Smokeless tobacco: Never  Vaping Use   Vaping status: Never Used  Substance and Sexual Activity   Alcohol use: Yes   Drug use: No   Sexual activity: Yes    Partners: Male    Birth control/protection: Post-menopausal    Comment: des neg  Other Topics Concern   Not on file  Social History Narrative   Not on file   Social Drivers of Health   Financial Resource Strain: Not on file  Food Insecurity: Low Risk  (05/22/2023)   Received from [facility]   Hunger Vital Sign    Within the past 12 months, you worried that your food would run out  before you got money to buy more: Never true    Within the past 12 months, the food you bought just didn't last and you didn't have money to get more. : Never true  Transportation Needs: No Transportation Needs (05/22/2023)   Received from [facility]    In the past 12 months, has lack of reliable transportation kept you from medical appointments, meetings, work or from getting things needed for daily living? : No  Physical Activity: Not on file  Stress: Not on file  Social Connections: Not on file    SOCIAL: Has four kids; one daughter lives in [place]  Family History: The patient's family history includes Arthritis in her sister; Autoimmune disease in her father; Breast cancer in her maternal grandmother; Breast cancer (age of onset: 36) in her sister; CVA in her father; Stroke in her father; Thyroid  disease in her daughter and sister; Tremor in her mother. There is no history of Uterine cancer, Bladder Cancer, or Renal cancer.  History of coronary artery disease  notable for no members. History of heart failure notable for no members. No history of cardiomyopathies including hypertrophic cardiomyopathy, left ventricular non-compaction, or arrhythmogenic right ventricular cardiomyopathy. History of arrhythmia notable for atrial fibrillation in father. Denies family history of sudden cardiac death including drowning, car accidents, or unexplained deaths in the family.  Father died after being his by a drunk driver at age 3. Father had bicuspid aortic valve and aneurysm without dissection.   ROS:   Please see the history of present illness.     All other systems reviewed and are negative.  EKGs/Labs/Other Studies Reviewed:    The following studies were reviewed today:  EKG:   04/13/2016: Sinus Bradycardia Rate 53 WNL  Cardiac Studies & Procedures   ______________________________________________________________________________________________   STRESS  TESTS  ECHOCARDIOGRAM STRESS TEST 11/20/2020  Narrative EXERCISE STRESS ECHO REPORT   --------------------------------------------------------------------------------  Patient [facility]:   [facility] Date of Exam: 11/20/2020 Medical Rec #:  [id number]           Height:       65.5 in Accession #:    [id number]          Weight:       150.0 lb Date of Birth:  03-04-1966           BSA:          1.760 m Patient Age:    54 years            BP:           90/69 mmHg Patient Gender: F                   HR:           70 bpm. Exam Location:  [address]  Procedure: Stress Echo  Indications:    R07.9 Chest pain  History:        Patient has prior history of Echocardiogram examinations, most recent 11/07/2020. Palpitations, Thoracic Aortic Aneurysm.  Sonographer:    Heather Hawks RDCS Referring Phys: [id number] [facility] A Alainna Stawicki  IMPRESSIONS   1. This is a negative stress echocardiogram for ischemia. 2. This is a low risk study.  FINDINGS  Exam Protocol: The patient exercised on a treadmill according to a Bruce protocol.   Patient Performance: The patient exercised for 9 minutes and 10 seconds, achieving 10 METS. The maximum stage achieved was IV of the Bruce protocol. The baseline heart rate was 70 bpm. The heart rate at peak stress was 171 bpm. The target heart rate was calculated to be 140 bpm. The percentage of maximum predicted heart rate achieved was 104.1 %. The baseline blood pressure was 90/69 mmHg. The blood pressure at peak stress was 132/76 mmHg. The blood pressure response was normal. The patient developed fatigue during the stress exam. The patient's functional capacity was average.  EKG: Resting EKG showed normal sinus rhythm with no abnormal findings. The patient developed no abnormal EKG findings during exercise.   2D Echo Findings: The baseline ejection fraction was 60%. The peak ejection fraction at stress was 80%. Baseline regional wall motion abnormalities  were not present. There were no stress-induced wall motion abnormalities. This is a negative stress echocardiogram for ischemia.   Wilbert Bihari MD Electronically signed on 11/20/2020 at 3:24:21 PM      Final   ECHOCARDIOGRAM  ECHOCARDIOGRAM COMPLETE 11/24/2023  Narrative ECHOCARDIOGRAM REPORT    Patient Name:   Laurie Terry Date of Exam: 11/24/2023 Medical Rec #:  [id number]           Height:       66.5 in Accession #:    [id number]          Weight:       156.0 lb Date of Birth:  01/14/1966           BSA:          1.810 m Patient Age:    57 years            BP:           112/68 mmHg Patient Gender: F                   HR:           64 bpm. Exam Location:  [address]  Procedure: 2D Echo, 3D Echo, Cardiac Doppler, Color Doppler and Strain Analysis (Both Spectral and Color Flow Doppler were utilized during procedure).  Indications:    I77.819 Aortic Dilation  History:        Patient has prior history of Echocardiogram examinations, most recent 01/23/2022. Risk Factors:Dyslipidemia and Former Smoker. Family History of Bicuspid Aortic Valve, Palpitations, Dilated Aorta.  Sonographer:    Heather Hawks RDCS Referring Phys: [facility] A Keane Martelli  IMPRESSIONS   1. Left ventricular ejection fraction, by estimation, is 60 to 65%. Left ventricular ejection fraction by 3D volume is 68 %. The left ventricle has normal function. The left ventricle has no regional wall motion abnormalities. Left ventricular diastolic parameters were normal. The average left ventricular global longitudinal strain is -26.1 %. The global longitudinal strain is normal. 2. Right ventricular systolic function is normal. The right ventricular size is normal. Tricuspid regurgitation signal is inadequate for assessing PA pressure. 3. Left atrial size was mildly dilated. 4. The mitral valve is normal in structure. Trivial mitral valve regurgitation. No evidence of mitral stenosis. 5. The aortic valve  is tricuspid. Aortic valve regurgitation is not visualized. No aortic stenosis is present. 6. Aortic dilatation noted. There is mild dilatation of the aortic root, measuring 39 mm. 7. The inferior vena cava is normal in size with greater than 50% respiratory variability, suggesting right atrial pressure of 3 mmHg.  FINDINGS Left Ventricle: Left ventricular ejection fraction, by estimation, is 60 to 65%. Left ventricular ejection fraction by 3D volume is 68 %. The left ventricle has normal function. The left ventricle has no regional wall motion abnormalities. The average left ventricular global longitudinal strain is -26.1 %. Strain was performed and the global longitudinal strain is normal. The left ventricular internal cavity size was normal in size. There is no left ventricular hypertrophy. Left ventricular diastolic parameters were normal.  Right Ventricle: The right ventricular size is normal. No increase in right ventricular wall thickness. Right ventricular systolic function is normal. Tricuspid regurgitation signal is inadequate for assessing PA pressure.  Left [facility]: Left atrial size was mildly dilated.  Right [facility]: Right atrial size was normal in size.  Pericardium: There is no evidence of pericardial effusion.  Mitral Valve: The mitral valve is normal in structure. Trivial mitral valve regurgitation. No evidence of mitral valve stenosis.  Tricuspid Valve: The tricuspid valve is normal in structure. Tricuspid valve regurgitation is not demonstrated.  Aortic Valve: The aortic valve is tricuspid. Aortic valve regurgitation is not visualized. No aortic stenosis is present.  Pulmonic Valve: The pulmonic valve was normal in structure. Pulmonic valve regurgitation is trivial.  Aorta: Aortic dilatation noted. There is mild dilatation  of the aortic root, measuring 39 mm.  Venous: The inferior vena cava is normal in size with greater than 50% respiratory variability, suggesting right  atrial pressure of 3 mmHg.  IAS/Shunts: No atrial level shunt detected by color flow Doppler.  Additional Comments: 3D was performed not requiring image post processing on an independent workstation and was normal.   LEFT VENTRICLE PLAX 2D LVIDd:         4.40 cm         Diastology LVIDs:         2.90 cm         LV e' medial:    11.30 cm/s LV PW:         0.90 cm         LV E/e' medial:  8.3 LV IVS:        0.60 cm         LV e' lateral:   12.40 cm/s LVOT diam:     2.00 cm         LV E/e' lateral: 7.6 LV SV:         63 LV SV Index:   35              2D Longitudinal LVOT Area:     3.14 cm        Strain 2D Strain GLS   -31.6 % (A4C): 2D Strain GLS   -23.5 % (A3C): 2D Strain GLS   -23.2 % (A2C): 2D Strain GLS   -26.1 % Avg:  3D Volume EF LV 3D EF:    Left ventricul ar ejection fraction by 3D volume is 68 %.  3D Volume EF: 3D EF:        68 % LV EDV:       82 ml LV ESV:       26 ml LV SV:        56 ml  RIGHT VENTRICLE RV Basal diam:  3.10 cm RV S prime:     12.90 cm/s TAPSE (M-mode): 1.8 cm  LEFT ATRIUM           Index        RIGHT ATRIUM           Index LA diam:      3.10 cm 1.71 cm/m   RA Pressure: 3.00 mmHg LA Vol (A2C): 75.2 ml 41.56 ml/m  RA Area:     13.30 cm LA Vol (A4C): 37.4 ml 20.67 ml/m  RA Volume:   33.30 ml  18.40 ml/m AORTIC VALVE LVOT Vmax:   99.20 cm/s LVOT Vmean:  61.600 cm/s LVOT VTI:    0.199 m  AORTA Ao Root diam: 3.90 cm Ao Asc diam:  3.70 cm  MITRAL VALVE               TRICUSPID VALVE MV Area (PHT): cm         Estimated RAP:  3.00 mmHg MV Decel Time: 176 msec MV E velocity: 94.35 cm/s  SHUNTS MV A velocity: 62.90 cm/s  Systemic VTI:  0.20 m MV E/A ratio:  1.50        Systemic Diam: 2.00 cm  Dalton McleanMD Electronically signed by Ezra Kanner Signature Date/Time: 11/24/2023/8:46:04 AM    Final    MONITORS  LONG TERM MONITOR (3-14 DAYS) 12/24/2021  Narrative  Patient had a minimum heart rate of 48 bpm, maximum heart  rate of 164 bpm, and average heart rate of 74 bpm.  Predominant  underlying rhythm was sinus rhythm.  Thirteen runs of suprventricular tachycardia occurred lasting 19 beats at longest with a max rate of 164 bpm at fastest.  Isolated PACs were rare (<1.0%).  Isolated PVCs were rare (<1.0%).  Triggered and diary events associated with sinus rhythm but occasionally with SVT.  Symptomatic SVT.   CT SCANS  CT CARDIAC SCORING (SELF PAY ONLY) 03/03/2023  Addendum 03/08/2023  9:04 PM ADDENDUM REPORT: 03/08/2023 21:01  EXAM: OVER-READ INTERPRETATION  CT CHEST  The following report is an over-read performed by radiologist Dr. Oneil Devonshire of [facility] on 03/08/2023. This over-read does not include interpretation of cardiac or coronary anatomy or pathology. The coronary calcium score interpretation by the cardiologist is attached.  COMPARISON:  None.  FINDINGS: Cardiovascular: There are no significant extracardiac vascular findings.  Mediastinum/Nodes: There are no enlarged lymph nodes within the visualized mediastinum.  Lungs/Pleura: There is no pleural effusion. The visualized lungs appear clear.  Upper abdomen: No significant findings in the visualized upper abdomen.  Musculoskeletal/Chest wall: No chest wall mass or suspicious osseous findings within the visualized chest.  IMPRESSION: No significant extracardiac findings within the visualized chest.   Electronically Signed By: Oneil Devonshire M.D. On: 03/08/2023 21:01  Narrative CLINICAL DATA:  Risk stratification: 58 Year-old Female  EXAM: Coronary Calcium Score  TECHNIQUE: The patient was scanned on a [employer]. Axial non-contrast 3 mm slices were carried out through the heart. The data set was analyzed on a dedicated work station and scored using the Agatson method.  FINDINGS: Non-cardiac: See separate report from [facility].  Ascending Aorta: Normal caliber for  non-contrast study. No calcifications.  Aortic Valve Calcium score: 0  Mitral annular calcification: 0  Pericardium: Normal.  Coronary arteries: Normal origins.  Coronary Calcium Score:  Left main: 0  Left anterior descending artery: 0  Left circumflex artery: 0  Right coronary artery: 0  Total: 0  Percentile: 1st for age, sex, and race matched control.  IMPRESSION: 1. Coronary calcium score of 0. This was 1st percentile for age, gender, and race matched controls.  RECOMMENDATIONS:  The proposed cut-off value of 1,651 AU yielded a 93 % sensitivity and 75 % specificity in grading AS severity in patients with classical low-flow, low-gradient AS. Proposed different cut-off values to define severe AS for men and women as 2,065 AU and 1,274 AU, respectively. The joint European and American recommendations for the assessment of AS consider the aortic valve calcium score as a continuum - a very high calcium score suggests severe AS and a low calcium score suggests severe AS is unlikely.  Donney VEAR Jarome LULLA Stephen RENETTE, et al. 2017 ESC/EACTS Guidelines for the management of valvular heart disease. Eur Heart J 2017;38:2739-91.  Coronary artery calcium (CAC) score is a strong predictor of incident coronary heart disease (CHD) and provides predictive information beyond traditional risk factors. CAC scoring is reasonable to use in the decision to withhold, postpone, or initiate statin therapy in intermediate-risk or selected borderline-risk asymptomatic adults (age 20-75 years and LDL-C >=70 to <190 mg/dL) who do not have diabetes or established atherosclerotic cardiovascular disease (ASCVD).* In intermediate-risk (10-year ASCVD risk >=7.5% to <20%) adults or selected borderline-risk (10-year ASCVD risk >=5% to <7.5%) adults in whom a CAC score is measured for the purpose of making a treatment decision the following recommendations have been made:  If CAC = 0, it is  reasonable to withhold statin therapy and reassess in 5 to 10 years, as long as  higher risk conditions are absent (diabetes mellitus, family history of premature CHD in first degree relatives (males <55 years; females <65 years), cigarette smoking, LDL >=190 mg/dL or other independent risk factors).  If CAC is 1 to 99, it is reasonable to initiate statin therapy for patients >=59 years of age.  If CAC is >=100 or >=75th percentile, it is reasonable to initiate statin therapy at any age.  Cardiology referral should be considered for patients with CAC scores =400 or >=75th percentile.  *2018 AHA/ACC/AACVPR/AAPA/ABC/ACPM/ADA/AGS/APhA/ASPC/NLA/PCNA Guideline on the Management of Blood Cholesterol: A Report of the American College of Cardiology/American Heart Association Task Force on Clinical Practice Guidelines. J Am Coll Cardiol. 2019;73(24):3168-3209.  Stanly Leavens, MD  Electronically Signed: By: Stanly Leavens M.D. On: 03/03/2023 10:10     ______________________________________________________________________________________________        Recent Labs: No results found for requested labs within last 365 days.  Recent Lipid Panel    Component Value Date/Time   CHOL 147 04/07/2016 0331   TRIG 57 04/07/2016 0331   HDL 48 04/07/2016 0331   CHOLHDL 3.1 04/07/2016 0331   VLDL 11 04/07/2016 0331   LDLCALC 88 04/07/2016 0331   LDLDIRECT 119 (H) 01/24/2023 1128    Physical Exam:    VS:  BP 101/69 (BP Location: Left Arm)   Pulse 71   Ht 5' 5 (1.651 m)   Wt 156 lb (70.8 kg)   SpO2 97%   BMI 25.96 kg/m     Wt Readings from Last 3 Encounters:  04/06/24 156 lb (70.8 kg)  01/08/24 159 lb 12.8 oz (72.5 kg)  10/13/23 156 lb (70.8 kg)    Gen: no distress  Neck: No JVD Cardiac: No Rubs or Gallops, no Murmur, RRR +2radial pulses Respiratory: Clear to auscultation bilaterally, normal effort, normal  respiratory rate GI: Soft, nontender, drain still in  place MS: No  edema;  moves all extremities Integument: Skin feels warm Neuro:  At time of evaluation, alert and oriented to person/place/time/situation  Psych: Normal affect, patient feels in reasonable spirits considering all she has been through   ASSESSMENT:    1. PAC (premature atrial contraction)   2. Paroxysmal SVT (supraventricular tachycardia) (HCC)   3. Other hyperlipidemia   4. Dilatation of aorta (HCC)     PLAN:    Paroxysmal supraventricular tachycardia (PSVT) Episodes occur a few times a year, often triggered by fatigue or caffeine. EKG today showed no PACs or PSVT. - Continue metoprolol  25 mg as needed for PSVT  Premature atrial contractions (PACs) No PACs on today's EKG. Managed with metoprolol  for symptomatic relief. - Continue metoprolol  25 mg as needed for PACs  Mild aortic dilation Mild aortic dilation without bicuspid aortic valve or significant valve disease. Echocardiogram in 2025 was within normal limits. Monitoring required.  - Order echocardiogram in March 2026  Elevated cholesterol Cholesterol levels elevated with successful lifestyle modification. No calcium buildup on recent calcium score. Discussed lifestyle modification versus medication. She prefers lifestyle changes first. - Order fasting lipid panel and lipoprotein(a) test this week or next week - Reassess cholesterol levels in September 2025 - Encourage lifestyle modifications to lower cholesterol  Longitudinal care: The evaluation and management services provided today reflect the complexity inherent in caring for this patient, including the ongoing longitudinal relationship and management of multiple chronic conditions and/or the need for care coordination. The visit required a comprehensive assessment and management plan tailored to the patient's unique needs Time was spent addressing not only the acute  concerns but also the broader context of the patient's health, including preventive care,  chronic disease management, and care coordination as appropriate.  Complex longitudinal is necessary for conditions including: minimal medication management of HLD in the setting of family history of stroke and bicuspid valve/atrial fibrillation   March 2026 with me; if no availability in my schedule then APP  Stanly Leavens, MD FASE [facility] Cardiologist [facility]  [address] [place], [place] [address] [phone]  8:50 AM

## 2024-04-08 ENCOUNTER — Ambulatory Visit: Payer: Self-pay

## 2024-04-08 DIAGNOSIS — E7849 Other hyperlipidemia: Secondary | ICD-10-CM

## 2024-04-08 LAB — LIPID PANEL
Chol/HDL Ratio: 3.2 ratio (ref 0.0–4.4)
Cholesterol, Total: 183 mg/dL (ref 100–199)
HDL: 58 mg/dL (ref >39)
LDL Chol Calc (NIH): 103 mg/dL — ABNORMAL HIGH (ref 0–99)
Triglycerides: 124 mg/dL (ref 0–149)
VLDL Cholesterol Cal: 22 mg/dL (ref 5–40)

## 2024-04-08 LAB — LIPOPROTEIN A (LPA): Lipoprotein (a): <8.4 nmol/L (ref <75.0)

## 2024-04-12 ENCOUNTER — Ambulatory Visit: Admitting: Obstetrics and Gynecology

## 2024-04-15 ENCOUNTER — Ambulatory Visit (INDEPENDENT_AMBULATORY_CARE_PROVIDER_SITE_OTHER): Admitting: Obstetrics and Gynecology

## 2024-04-15 ENCOUNTER — Encounter: Payer: Self-pay | Admitting: Obstetrics and Gynecology

## 2024-04-15 VITALS — BP 101/69 | HR 66

## 2024-04-15 DIAGNOSIS — N3281 Overactive bladder: Secondary | ICD-10-CM

## 2024-04-15 DIAGNOSIS — R350 Frequency of micturition: Secondary | ICD-10-CM

## 2024-04-15 DIAGNOSIS — N3941 Urge incontinence: Secondary | ICD-10-CM

## 2024-04-15 NOTE — Progress Notes (Signed)
 [facility]  PTNS VISIT  CC:  Overactive bladder  58 y.o. with refractory overactive bladder who presents for percutaneous tibial nerve stimulation. The patient presents for PTNS session # 12.  Patient feels this has made a noticeable difference in her bowel and bladder activity. She endorses reduced urgency and frequency. Patient to send in bladder diary for comparison to previous.   Procedure: The patient was placed in the sitting position and the right lower extremity was prepped in the usual fashion. The PTNS needle was then inserted at a 60 degree angle, 5 cm cephalad and 2 cm posterior to the medial malleolus. The PTNS unit was then programmed and an optimal response was noted at 8 milliamps. The PTNS stimulation was then performed at this setting for 30 minutes without incident and the patient tolerated the procedure well. The needle was removed and hemostasis was noted.   Patient to call if she would like to do a Maintenance session in 6-12 weeks. She endorses she will call if she needs assistance.

## 2024-04-23 ENCOUNTER — Other Ambulatory Visit: Payer: Self-pay | Admitting: Medical Genetics

## 2024-04-24 ENCOUNTER — Other Ambulatory Visit: Payer: Self-pay

## 2024-04-27 ENCOUNTER — Ambulatory Visit: Admitting: Physical Therapy

## 2024-04-28 ENCOUNTER — Ambulatory Visit

## 2024-04-29 ENCOUNTER — Ambulatory Visit: Attending: Obstetrics and Gynecology | Admitting: Physical Therapy

## 2024-04-29 DIAGNOSIS — R293 Abnormal posture: Secondary | ICD-10-CM | POA: Diagnosis present

## 2024-04-29 DIAGNOSIS — M6281 Muscle weakness (generalized): Secondary | ICD-10-CM | POA: Insufficient documentation

## 2024-04-29 DIAGNOSIS — R279 Unspecified lack of coordination: Secondary | ICD-10-CM | POA: Insufficient documentation

## 2024-04-29 NOTE — Patient Instructions (Signed)
 Urge Incontinence  Ideal urination frequency is every 2-4 wakeful hours, which equates to 5-8 times within a 24-hour period.   Urge incontinence is leakage that occurs when the bladder muscle contracts, creating a sudden need to go before getting to the bathroom.   Going too often when your bladder isn't actually full can disrupt the body's automatic signals to store and hold urine longer, which will increase urgency/frequency.  In this case, the bladder "is running the show" and strategies can be learned to retrain this pattern.   One should be able to control the first urge to urinate, at around .  The bladder can hold up to a "grande latte," or . To help you gain control, practice the Urge Drill below when urgency strikes.  This drill will help retrain your bladder signals and allow you to store and hold urine longer.  The overall goal is to stretch out your time between voids to reach a more manageable voiding schedule.    Practice your quick flicks often throughout the day (each waking hour) even when you don't need feel the urge to go.  This will help strengthen your pelvic floor muscles, making them more effective in controlling leakage.  Urge Drill  When you feel an urge to go, follow these steps to regain control: Stop what you are doing and be still Take one deep breath, directing your air into your abdomen Think an affirming thought, such as "I've got this." Do 5 quick flicks of your pelvic floor Walk with control to the bathroom to void, or delay voiding  Can count backwards from 10 all the way to the bathroom

## 2024-04-29 NOTE — Therapy (Signed)
 OUTPATIENT PHYSICAL THERAPY FEMALE PELVIC TREATMENT   Patient Name: Laurie Terry MRN: [id number] DOB:[birth year], 58 y.o., female Today's Date: 04/29/2024  END OF SESSION:  PT End of Session - 04/29/24 0754     Visit Number 2    Number of Visits 10    Date for PT Re-Evaluation 04/01/24    Authorization Type [employer]    Authorization - Number of Visits 20   auth req after 20   PT Start Time 0800    PT Stop Time 0841    PT Time Calculation (min) 41 min    Activity Tolerance Patient tolerated treatment well    Behavior During Therapy [facility] for tasks assessed/performed          Past Medical History:  Diagnosis Date   Allergic rhinitis    Aortic aneurysm (HCC) 09/23/2020   Cancer (HCC)    Melanoma   Cervical dysplasia 2007   low-grade SIL, normal Pap smears since then   Family history of adverse reaction to anesthesia    mom has h/o post op nausea/vomiting   Intermittent palpitations    IUD    inserted 03/2002.04/06/2007, 06/03/2012   Ocular migraine    Osteomalacia    Pancreatitis    Seafood allergy    Past Surgical History:  Procedure Laterality Date   BIOPSY  01/21/2022   Procedure: BIOPSY;  Surgeon: Dianna Specking, MD;  Location: [facility];  Service: Gastroenterology;;   ROMAYNE  2018   CHOLECYSTECTOMY N/A 04/09/2016   Procedure: LAPAROSCOPIC CHOLECYSTECTOMY ;  Surgeon: Donnice Bury, MD;  Location: [facility] OR;  Service: General;  Laterality: N/A;   ERCP  04/11/2016   ERCP with pancreatic stent and failed CBD stent after needle knife and conventional sphincterotomy   ERCP N/A 04/12/2016   Procedure: ENDOSCOPIC RETROGRADE CHOLANGIOPANCREATOGRAPHY (ERCP);  Surgeon: Oliva Boots, MD;  Location: [facility];  Service: Endoscopy;  Laterality: N/A;   ERCP N/A 01/15/2022   Procedure: ENDOSCOPIC RETROGRADE CHOLANGIOPANCREATOGRAPHY (ERCP);  Surgeon: Boots Oliva, MD;  Location: [facility];  Service: Gastroenterology;  Laterality: N/A;   FLEXIBLE  SIGMOIDOSCOPY N/A 01/21/2022   Procedure: FLEXIBLE SIGMOIDOSCOPY;  Surgeon: Dianna Specking, MD;  Location: [facility];  Service: Gastroenterology;  Laterality: N/A;   FOOT SURGERY     toenail   INTRAUTERINE DEVICE INSERTION  08/07/2017   Mirena    IR EXCHANGE BILIARY DRAIN  02/11/2022   IR RADIOLOGIST EVAL & MGMT  02/08/2022   IR RADIOLOGIST EVAL & MGMT  02/20/2022   IR RADIOLOGIST EVAL & MGMT  02/28/2022   removal of melanoma     REMOVAL OF STONES  01/15/2022   Procedure: REMOVAL OF STONES;  Surgeon: Boots Oliva, MD;  Location: [facility];  Service: Gastroenterology;;   ANNETT  01/15/2022   Procedure: ANNETT;  Surgeon: Boots Oliva, MD;  Location: [facility];  Service: Gastroenterology;;   Patient Active Problem List   Diagnosis Date Noted   Dilatation of aorta (HCC) 01/24/2023   Perinephric abscess 03/03/2022   Norovirus 02/15/2022   Dry cough 02/02/2022   Iatrogenic subdiaphragmatic abscess (HCC) 02/02/2022   Pleural effusion on right    Post-ERCP acute pancreatitis 01/16/2022   GERD without esophagitis 01/16/2022   Choledocholithiasis 01/16/2022   Adverse effect of other drugs, medicaments and biological substances, subsequent encounter 05/23/2021   Other allergic rhinitis 05/23/2021   Allergic conjunctivitis of both eyes 05/23/2021   History of asthma 05/23/2021   Oral allergy syndrome, subsequent encounter 05/23/2021   Family history of bicuspid aortic valve 11/28/2020  Other hyperlipidemia 11/28/2020   PAC (premature atrial contraction) 10/30/2020   Postoperative bile leak 04/12/2016   Acute hypokalemia 04/12/2016   Paroxysmal SVT (supraventricular tachycardia) (HCC) 04/12/2016   Bile leak, postoperative 04/12/2016   S/P laparoscopic cholecystectomy    History of Gallstone pancreatitis 04/07/2016   Pancreatitis due to biliary obstruction 04/07/2016   Cancer (HCC)    Osteopenia     PCP: Sari Mcbride   REFERRING PROVIDER: Zuleta, Kaitlin G,  NP   REFERRING DIAG: R35.0 (ICD-10-CM) - Urinary frequency R82.998 (ICD-10-CM) - Leukocytes in urine N39.41 (ICD-10-CM) - Urge incontinence of urine R15.1 (ICD-10-CM) - Fecal smearing  THERAPY DIAG:  Muscle weakness (generalized)  Abnormal posture  Unspecified lack of coordination  Rationale for Evaluation and Treatment: Rehabilitation  ONSET DATE: within the last year or so   SUBJECTIVE:                                                                                                                                                                                           SUBJECTIVE STATEMENT: PTNS was very helpful, that has completed and is now back to having some issues with urination at night with increased frequency and has urge incontinence sometimes en route to bathroom at night. Moderate to large amount of urine loss.  Fecal incontinence - still weekly having small formed balls. Has sensation of something present and then loss of stool and can happen several times in that day. Worse with days without urge or bowel movements. On average feels like she fully empties bowels but needs to wipe a lot. Was recently diagnosed with polymyalgia rheumatica  PAIN:  Are you having pain? No NPRS scale: 0/10  PRECAUTIONS: None  RED FLAGS: None   WEIGHT BEARING RESTRICTIONS: No  FALLS:  Has patient fallen in last 6 months? 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  Sexual abuse: No  BOWEL MOVEMENT: Pain with bowel movement: No Type of bowel movement:Type  (Bristol Stool Scale) 4-5, Frequency 3-4x/week, Strain no, and Splinting no Fully empty rectum: Yes: it depends on if its a large BM or not  Leakage: Yes: on days when she is not having a bowel  movement  Pads: No Fiber supplement/laxative Yes - consumes natural fiber in her foods   URINATION: Pain with urination: No Fully empty bladder: Yes: testing has shown otherwise though - she feels like she empties fully  Stream: Strong Urgency: Yes  Frequency: within normal limits - gets up 1-3x/night  Leakage: Walking to the bathroom Pads: No  INTERCOURSE: currently sexually active   Ability to have vaginal penetration Yes  Pain with intercourse: none DrynessYes - has a vaginal estrogen cream  Climax: yes Marinoff Scale: 1/3  PREGNANCY: Vaginal deliveries 4, 9lb babies Tearing No Episiotomy Yes  C-section deliveries 0 Currently pregnant No  PROLAPSE: None  OBJECTIVE:  Note: Objective measures were completed at Evaluation unless otherwise noted.  PATIENT SURVEYS:  PFIQ-7: 45  COGNITION: Overall cognitive status: Within functional limits for tasks assessed     SENSATION: Light touch: Appears intact  LUMBAR SPECIAL TESTS:  Single leg stance test: Positive  FUNCTIONAL TESTS:  Squat test: bilateral dynamic knee valgus with loading, general lumbopelvic stiffness present   GAIT: Assistive device utilized: None Comments: mild trendelenburg gait pattern with ambulation   POSTURE: rounded shoulders and forward head  LUMBARAROM/PROM: within normal limits   A/PROM A/PROM  eval  Flexion 100%  Extension 100%  Right lateral flexion 100%  Left lateral flexion 100%  Right rotation 100%  Left rotation 100%   (Blank rows = not tested)  LOWER EXTREMITY ROM: within functional limits   LOWER EXTREMITY MMT: 4/5 bilateral knees and hips grossly  PALPATION:   General: no significant tenderness to glute medius/max/piriformis/coccyx/sacrum  Pelvic Alignment: within normal  limits   Abdominal: upper chest breathing and abdominal bracing present at rest, patient demonstrates sufficient lower rib mobility for deep diaphragmatic breathing                 External Perineal Exam: dryness present around EAS, anal wink reflex positive                             Internal Pelvic Floor: Patient fully consents to today's rectal examination of her pelvic floor musculature. She had no pain during today's examination. She demonstrates weakness at external and internal anal sphincters, only able to achieve a weak 3/5 contraction in sidelying. She is able to create a stronger (4/5) contraction when she pairs an exhale with her contraction. She is stiff throughout the rectal musculature, most likely from not utilizing the full range of motion of the rectal muscles. No increase in overall tone following assessment. No fecal smearing present on glove.  Patient confirms identification and approves PT to assess internal pelvic floor and treatment Yes No emotional/communication barriers or cognitive limitation. Patient is motivated to learn. Patient understands and agrees with treatment goals and plan. PT explains patient will be examined in standing, sitting, and lying down to see how their muscles and joints work. When they are ready, they will be asked to remove their underwear so PT can examine their perineum. The patient is also given the option of providing their own chaperone as one is not provided in our facility. The patient also has the right and is explained the right to defer or refuse any part of the evaluation or treatment including the internal exam. With the patient's consent, PT will use one gloved finger to gently assess the muscles of the pelvic floor, seeing how well it contracts and relaxes and if there is muscle symmetry. After, the patient will  get dressed and PT and patient will discuss exam findings and plan of care. PT and patient discuss plan of care, schedule,  attendance policy and HEP activities.  PELVIC MMT:   MMT eval  Vaginal   Internal Anal Sphincter 3/5  External Anal Sphincter 3/5  Puborectalis 3/5  Diastasis Recti   (Blank rows = not tested)       TONE: Decreased throughout superficial and deep rectal musculature.  PROLAPSE: N/A  TODAY'S TREATMENT:                                                                                                                              DATE:   EVAL 03/04/24: Examination completed, findings reviewed, pt educated on POC, HEP, and self care. Pt motivated to participate in PT and agreeable to attempt recommendations.  Neuro re-ed: Hooklying diaphragmatic breathing + pelvic floor lengthening with inhalation and shortening with exhalation 3x10  Self care: Relative anatomy and the connection between the diaphragm and pelvic floor, how to contract the pelvic floor vs relaxing the musculature  04/29/24: Pt educated on voiding mechanics, step stool, urge drill, bladder retraining and updated HEP.  Pt benefited from extra time for education of these topics as she has urine and fecal incontinence and time spent answering all questions and insuring she has full understanding on how to implement all techniques - pt denied questions at end of session and reports understanding 2x10 pelvic floor contractions - improved awareness and feeling of pelvic floor mobility with towel roll in sitting and cues for slight posterior weight shift to improve sensation of posterior pelvic floor    PATIENT EDUCATION:  Education details: Relative anatomy and the connection between the diaphragm and pelvic floor, how to contract the pelvic floor vs relaxing the musculature Person educated: Patient Education method: Explanation, Demonstration, Tactile cues, Verbal cues, and Handouts Education comprehension: verbalized understanding, returned demonstration, verbal cues required, tactile cues required, and needs further  education  HOME EXERCISE PROGRAM: Access Code: [email or web address] URL: [email or web address] Date: 04/29/2024 Prepared by: Darryle   Exercises - Supine Pelvic Floor Contraction  - 1 x daily - 7 x weekly - 3 sets - 10 reps - pelvic floor contract and holds  - 1 x daily - 7 x weekly - 3 sets - 10 reps - 15s holds  Patient Education - Bowel Emptying Techniques - Abdominal Massage for Constipation  ASSESSMENT:  CLINICAL IMPRESSION: Patient is a 58 y.o. female who was seen today for physical therapy  treatment for urinary frequency/urge and fecal incontinence. Pt tolerated session well, had several questions throughout session, all answered and pt denied questions at end of session. Pt very motivated to implement recommendations and HEP. Overall, patient tolerated session well and Pt would benefit from additional PT to further address deficits.    OBJECTIVE IMPAIRMENTS: decreased coordination, decreased endurance, decreased mobility, decreased ROM, and decreased strength.   ACTIVITY LIMITATIONS: continence  PARTICIPATION LIMITATIONS: N/A  PERSONAL FACTORS: Age,  Past/current experiences, and Time since onset of injury/illness/exacerbation are also affecting patient's functional outcome.   REHAB POTENTIAL: Good  CLINICAL DECISION MAKING: Stable/uncomplicated  EVALUATION COMPLEXITY: Low   GOALS: Goals reviewed with patient? Yes  SHORT TERM GOALS: Target date: 04/01/2024  Pt will be independent with HEP.  Baseline: Goal status: INITIAL  2.  Pt will be independent with diaphragmatic breathing and down training activities in order to improve pelvic floor relaxation. Baseline:  Goal status: INITIAL  3.  Pt will be independent with use of squatty potty, relaxed toileting mechanics, and improved bowel movement techniques in order to increase ease of bowel movements and complete evacuation.   Baseline:  Goal status: INITIAL  4.  Pt will be independent with the knack, urge  suppression technique, and double voiding in order to improve bladder habits and decrease urinary incontinence.   Baseline:  Goal status: INITIAL   LONG TERM GOALS: Target date: 09/03/2024  Pt will be independent with advanced HEP.  Baseline:  Goal status: INITIAL  2.  Pt to demonstrate improved coordination of pelvic floor and breathing mechanics with 10# squat with appropriate synergistic patterns to decrease leakage at least 75% of the time for improved ability to complete a 30 minute workout with strain at pelvic floor and symptoms.   Baseline:  Goal status: INITIAL  3.  Pt will report her BMs are complete due to improved bowel habits and evacuation techniques to improve quality of life. Baseline:  Goal status: INITIAL  4.  Pt will be able to teach back and utilize urge suppression technique in order to help reduce number of trips to the bathroom and to decrease urge urinary incontinence when trying to make it to the bathroom. Baseline:  Goal status: INITIAL  PLAN:  PT FREQUENCY: 1-2x/week  PT DURATION: 12 weeks  PLANNED INTERVENTIONS: 97110-Therapeutic exercises, 97530- Therapeutic activity, 97112- Neuromuscular re-education, 97535- Self Care, [id number]- Manual therapy, Patient/Family education, Taping, Joint mobilization, Spinal mobilization, Scar mobilization, Cryotherapy, and Moist heat  PLAN FOR NEXT SESSION: continued pelvic lfoor AROM training in seated position, introduce toileting mechanics for complete emptying, manual to rectal/vaginal musculature, education surrounding urge suppression   Darryle Navy, PT, DPT 08/07/258:52 AM  [facility] [address] [place], [place] [address] Phone # [phone] Fax [phone]

## 2024-04-30 ENCOUNTER — Ambulatory Visit
Admission: RE | Admit: 2024-04-30 | Discharge: 2024-04-30 | Disposition: A | Discharge status: Home / Self Care | Source: Ambulatory Visit | Attending: Obstetrics and Gynecology | Admitting: Obstetrics and Gynecology

## 2024-04-30 DIAGNOSIS — Z1231 Encounter for screening mammogram for malignant neoplasm of breast: Secondary | ICD-10-CM

## 2024-05-03 ENCOUNTER — Ambulatory Visit: Payer: Self-pay | Admitting: Obstetrics and Gynecology

## 2024-05-04 ENCOUNTER — Ambulatory Visit: Admitting: Physical Therapy

## 2024-05-04 DIAGNOSIS — R279 Unspecified lack of coordination: Secondary | ICD-10-CM

## 2024-05-04 DIAGNOSIS — M6281 Muscle weakness (generalized): Secondary | ICD-10-CM

## 2024-05-04 DIAGNOSIS — R293 Abnormal posture: Secondary | ICD-10-CM

## 2024-05-04 NOTE — Therapy (Signed)
 OUTPATIENT PHYSICAL THERAPY FEMALE PELVIC TREATMENT   Patient Name: PHYLLIS ABELSON MRN: [id number] DOB:1965/10/11, 58 y.o., female Today's Date: 05/04/2024  END OF SESSION:  PT End of Session - 05/04/24 0832     Visit Number 3    Number of Visits 10    Date for PT Re-Evaluation 04/01/24    Authorization Type [employer]    Authorization - Number of Visits 20   auth req after 20   PT Start Time 0800    PT Stop Time 0830    PT Time Calculation (min) 30 min    Activity Tolerance Patient tolerated treatment well    Behavior During Therapy [facility] for tasks assessed/performed           Past Medical History:  Diagnosis Date   Allergic rhinitis    Aortic aneurysm (HCC) 09/23/2020   Cancer (HCC)    Melanoma   Cervical dysplasia 2007   low-grade SIL, normal Pap smears since then   Family history of adverse reaction to anesthesia    mom has h/o post op nausea/vomiting   Intermittent palpitations    IUD    inserted 03/2002.04/06/2007, 06/03/2012   Ocular migraine    Osteomalacia    Pancreatitis    Seafood allergy    Past Surgical History:  Procedure Laterality Date   BIOPSY  01/21/2022   Procedure: BIOPSY;  Surgeon: Dianna Specking, MD;  Location: [facility];  Service: Gastroenterology;;   ROMAYNE  2018   CHOLECYSTECTOMY N/A 04/09/2016   Procedure: LAPAROSCOPIC CHOLECYSTECTOMY ;  Surgeon: Donnice Bury, MD;  Location: [facility] OR;  Service: General;  Laterality: N/A;   ERCP  04/11/2016   ERCP with pancreatic stent and failed CBD stent after needle knife and conventional sphincterotomy   ERCP N/A 04/12/2016   Procedure: ENDOSCOPIC RETROGRADE CHOLANGIOPANCREATOGRAPHY (ERCP);  Surgeon: Oliva Boots, MD;  Location: [facility]COPY;  Service: Endoscopy;  Laterality: N/A;   ERCP N/A 01/15/2022   Procedure: ENDOSCOPIC RETROGRADE CHOLANGIOPANCREATOGRAPHY (ERCP);  Surgeon: Boots Oliva, MD;  Location: [facility];  Service: Gastroenterology;  Laterality: N/A;   FLEXIBLE  SIGMOIDOSCOPY N/A 01/21/2022   Procedure: FLEXIBLE SIGMOIDOSCOPY;  Surgeon: Dianna Specking, MD;  Location: [facility];  Service: Gastroenterology;  Laterality: N/A;   FOOT SURGERY     toenail   INTRAUTERINE DEVICE INSERTION  08/07/2017   Mirena    IR EXCHANGE BILIARY DRAIN  02/11/2022   IR RADIOLOGIST EVAL & MGMT  02/08/2022   IR RADIOLOGIST EVAL & MGMT  02/20/2022   IR RADIOLOGIST EVAL & MGMT  02/28/2022   removal of melanoma     REMOVAL OF STONES  01/15/2022   Procedure: REMOVAL OF STONES;  Surgeon: Boots Oliva, MD;  Location: [facility];  Service: Gastroenterology;;   ANNETT  01/15/2022   Procedure: ANNETT;  Surgeon: Boots Oliva, MD;  Location: [facility];  Service: Gastroenterology;;   Patient Active Problem List   Diagnosis Date Noted   Dilatation of aorta (HCC) 01/24/2023   Perinephric abscess 03/03/2022   Norovirus 02/15/2022   Dry cough 02/02/2022   Iatrogenic subdiaphragmatic abscess (HCC) 02/02/2022   Pleural effusion on right    Post-ERCP acute pancreatitis 01/16/2022   GERD without esophagitis 01/16/2022   Choledocholithiasis 01/16/2022   Adverse effect of other drugs, medicaments and biological substances, subsequent encounter 05/23/2021   Other allergic rhinitis 05/23/2021   Allergic conjunctivitis of both eyes 05/23/2021   History of asthma 05/23/2021   Oral allergy syndrome, subsequent encounter 05/23/2021   Family history of bicuspid aortic valve  11/28/2020   Other hyperlipidemia 11/28/2020   PAC (premature atrial contraction) 10/30/2020   Postoperative bile leak 04/12/2016   Acute hypokalemia 04/12/2016   Paroxysmal SVT (supraventricular tachycardia) (HCC) 04/12/2016   Bile leak, postoperative 04/12/2016   S/P laparoscopic cholecystectomy    History of Gallstone pancreatitis 04/07/2016   Pancreatitis due to biliary obstruction 04/07/2016   Cancer (HCC)    Osteopenia     PCP: Sari Mcbride   REFERRING PROVIDER: Zuleta, Kaitlin G,  NP   REFERRING DIAG: R35.0 (ICD-10-CM) - Urinary frequency R82.998 (ICD-10-CM) - Leukocytes in urine N39.41 (ICD-10-CM) - Urge incontinence of urine R15.1 (ICD-10-CM) - Fecal smearing  THERAPY DIAG:  Muscle weakness (generalized)  Abnormal posture  Unspecified lack of coordination  Rationale for Evaluation and Treatment: Rehabilitation  ONSET DATE: within the last year or so   SUBJECTIVE:                                                                                                                                                                                           SUBJECTIVE STATEMENT: Patient reports that she is doing well this morning. She is currently working with a rheumatologist on her autoimmune disorders. She is noticing leakage at night when she tries to make it to the bathroom. She has been on a voiding schedule and using the urge drill at home when she wakes with urgency. She has fecal incontinence that are small pieces that just fall out. She does not get urgency with this instance. Some days are worse than others and some days it is all day long. She is taking calcium now with her new diagnoses.   Was recently diagnosed with polymyalgia rheumatica  PAIN:  Are you having pain? No NPRS scale: 0/10  PRECAUTIONS: None  RED FLAGS: None   WEIGHT BEARING RESTRICTIONS: No  FALLS:  Has patient fallen in last 6 months? 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  Sexual abuse: No  BOWEL MOVEMENT: Pain with bowel movement: No Type of bowel  movement:Type (Bristol Stool Scale) 4-5, Frequency 3-4x/week, Strain no, and Splinting no Fully empty rectum: Yes: it depends on if its a large BM or not  Leakage: Yes: on days when she is not having a bowel movement  Pads: No Fiber supplement/laxative Yes - consumes natural fiber in her foods   URINATION: Pain with urination: No Fully empty bladder: Yes: testing has shown otherwise though - she feels like she empties fully  Stream: Strong Urgency: Yes  Frequency: within normal limits - gets up 1-3x/night  Leakage: Walking to the bathroom Pads: No  INTERCOURSE: currently sexually active   Ability to have vaginal penetration Yes  Pain with intercourse: none DrynessYes - has a vaginal estrogen cream  Climax: yes Marinoff Scale: 1/3  PREGNANCY: Vaginal deliveries 4, 9lb babies Tearing No Episiotomy Yes  C-section deliveries 0 Currently pregnant No  PROLAPSE: None  OBJECTIVE:  Note: Objective measures were completed at Evaluation unless otherwise noted.  PATIENT SURVEYS:  PFIQ-7: 45  COGNITION: Overall cognitive status: Within functional limits for tasks assessed     SENSATION: Light touch: Appears intact  LUMBAR SPECIAL TESTS:  Single leg stance test: Positive  FUNCTIONAL TESTS:  Squat test: bilateral dynamic knee valgus with loading, general lumbopelvic stiffness present   GAIT: Assistive device utilized: None Comments: mild trendelenburg gait pattern with ambulation   POSTURE: rounded shoulders and forward head  LUMBARAROM/PROM: within normal limits   A/PROM A/PROM  eval  Flexion 100%  Extension 100%  Right lateral flexion 100%  Left lateral flexion 100%  Right rotation 100%  Left rotation 100%   (Blank rows = not tested)  LOWER EXTREMITY ROM: within functional limits   LOWER EXTREMITY MMT: 4/5 bilateral knees and hips grossly  PALPATION:   General: no significant tenderness to glute medius/max/piriformis/coccyx/sacrum  Pelvic Alignment:  within normal limits   Abdominal: upper chest breathing and abdominal bracing present at rest, patient demonstrates sufficient lower rib mobility for deep diaphragmatic breathing                 External Perineal Exam: dryness present around EAS, anal wink reflex positive                             Internal Pelvic Floor: Patient fully consents to today's rectal examination of her pelvic floor musculature. She had no pain during today's examination. She demonstrates weakness at external and internal anal sphincters, only able to achieve a weak 3/5 contraction in sidelying. She is able to create a stronger (4/5) contraction when she pairs an exhale with her contraction. She is stiff throughout the rectal musculature, most likely from not utilizing the full range of motion of the rectal muscles. No increase in overall tone following assessment. No fecal smearing present on glove.  Patient confirms identification and approves PT to assess internal pelvic floor and treatment Yes No emotional/communication barriers or cognitive limitation. Patient is motivated to learn. Patient understands and agrees with treatment goals and plan. PT explains patient will be examined in standing, sitting, and lying down to see how their muscles and joints work. When they are ready, they will be asked to remove their underwear so PT can examine their perineum. The patient is also given the option of providing their own chaperone as one is not provided in our facility. The patient also has the right and is explained the right to defer or refuse any part of the evaluation or treatment including the internal exam. With the patient's consent, PT will use one gloved finger to gently assess the muscles of the pelvic floor, seeing how well it contracts and  relaxes and if there is muscle symmetry. After, the patient will get dressed and PT and patient will discuss exam findings and plan of care. PT and patient discuss plan of care,  schedule, attendance policy and HEP activities.  PELVIC MMT:   MMT eval 05/04/24  Vaginal  4/5, 10 quick flicks   Internal Anal Sphincter 3/5   External Anal Sphincter 3/5   Puborectalis 3/5   Diastasis Recti    (Blank rows = not tested)       TONE: Decreased throughout superficial and deep rectal musculature. 05/04/24: normal tone throughout superficial and deep pelvic floor musculature bilaterally   PROLAPSE: N/A  TODAY'S TREATMENT:                                                                                                                              DATE:   EVAL 03/04/24: Examination completed, findings reviewed, pt educated on POC, HEP, and self care. Pt motivated to participate in PT and agreeable to attempt recommendations.  Neuro re-ed: Hooklying diaphragmatic breathing + pelvic floor lengthening with inhalation and shortening with exhalation 3x10  Self care: Relative anatomy and the connection between the diaphragm and pelvic floor, how to contract the pelvic floor vs relaxing the musculature  04/29/24: Pt educated on voiding mechanics, step stool, urge drill, bladder retraining and updated HEP.  Pt benefited from extra time for education of these topics as she has urine and fecal incontinence and time spent answering all questions and insuring she has full understanding on how to implement all techniques - pt denied questions at end of session and reports understanding 2x10 pelvic floor contractions - improved awareness and feeling of pelvic floor mobility with towel roll in sitting and cues for slight posterior weight shift to improve sensation of posterior pelvic floor   05/04/24: Patient needs to leave session early today to make it to work Neuro re-ed/manual therapy: Hooklying diaphragmatic breathing + pelvic floor lengthening with inhalation and shortening with exhalation 3x10  Internal vaginal muscle cueing for lengthening with inhalation + shortening with exhalation   Quick flick contractions + diaphragmatic breathing 2x10  Internal pelvic floor review of coordination-based exercises to ensure patient is performing contractions correctly  Self care: Relative anatomy and the connection between the diaphragm and pelvic floor, how to contract the pelvic floor vs relaxing the musculature Review of urge drill/squatty potty mechanics   PATIENT EDUCATION:  Education details: Relative anatomy and the connection between the diaphragm and pelvic floor, how to contract the pelvic floor vs relaxing the musculature Person educated: Patient Education method: Explanation, Demonstration, Tactile cues, Verbal cues, and Handouts Education comprehension: verbalized understanding, returned demonstration, verbal cues required, tactile cues required, and needs further education  HOME EXERCISE PROGRAM: Access Code: [email or web address] URL: [email or web address] Date: 05/04/2024 Prepared by: Celena Domino  Exercises - Supine Pelvic Floor Contraction  - 1 x daily - 7 x weekly - 3 sets - 10 reps - Quick Flick Pelvic  Floor Contractions in Hooklying  - 1 x daily - 7 x weekly - 3 sets - 10 reps - pelvic floor contract and holds  - 1 x daily - 7 x weekly - 3 sets - 10 reps - 15s holds  ASSESSMENT:  CLINICAL IMPRESSION: Patient is a 58 y.o. female who was seen today for physical therapy  treatment for urinary frequency/urge and fecal incontinence. Internal vaginal pelvic floor training performed today as a review of previous HEP and introduction to endurance-based pelvic floor training exercises. Patient had no pain with examination and demonstrates improved coordination in the musculature compared to eval. Pt very motivated to implement recommendations and HEP. Overall, patient tolerated session well and Pt would benefit from additional PT to further address deficits.    OBJECTIVE IMPAIRMENTS: decreased coordination, decreased endurance, decreased mobility, decreased ROM, and  decreased strength.   ACTIVITY LIMITATIONS: continence  PARTICIPATION LIMITATIONS: N/A  PERSONAL FACTORS: Age, Past/current experiences, and Time since onset of injury/illness/exacerbation are also affecting patient's functional outcome.   REHAB POTENTIAL: Good  CLINICAL DECISION MAKING: Stable/uncomplicated  EVALUATION COMPLEXITY: Low   GOALS: Goals reviewed with patient? Yes  SHORT TERM GOALS: Target date: 04/01/2024  Pt will be independent with HEP.  Baseline: Goal status: INITIAL  2.  Pt will be independent with diaphragmatic breathing and down training activities in order to improve pelvic floor relaxation. Baseline:  Goal status: INITIAL  3.  Pt will be independent with use of squatty potty, relaxed toileting mechanics, and improved bowel movement techniques in order to increase ease of bowel movements and complete evacuation.   Baseline:  Goal status: INITIAL  4.  Pt will be independent with the knack, urge suppression technique, and double voiding in order to improve bladder habits and decrease urinary incontinence.   Baseline:  Goal status: INITIAL   LONG TERM GOALS: Target date: 09/03/2024  Pt will be independent with advanced HEP.  Baseline:  Goal status: INITIAL  2.  Pt to demonstrate improved coordination of pelvic floor and breathing mechanics with 10# squat with appropriate synergistic patterns to decrease leakage at least 75% of the time for improved ability to complete a 30 minute workout with strain at pelvic floor and symptoms.   Baseline:  Goal status: INITIAL  3.  Pt will report her BMs are complete due to improved bowel habits and evacuation techniques to improve quality of life. Baseline:  Goal status: INITIAL  4.  Pt will be able to teach back and utilize urge suppression technique in order to help reduce number of trips to the bathroom and to decrease urge urinary incontinence when trying to make it to the bathroom. Baseline:  Goal status:  INITIAL  PLAN:  PT FREQUENCY: 1-2x/week  PT DURATION: 12 weeks  PLANNED INTERVENTIONS: 97110-Therapeutic exercises, 97530- Therapeutic activity, 97112- Neuromuscular re-education, 97535- Self Care, [id number]- Manual therapy, Patient/Family education, Taping, Joint mobilization, Spinal mobilization, Scar mobilization, Cryotherapy, and Moist heat  PLAN FOR NEXT SESSION: continued pelvic lfoor AROM training in seated position, introduce toileting mechanics for complete emptying, manual to rectal/vaginal musculature, education surrounding urge suppression   Celena Domino, PT, DPT 05/04/24 8:32 AM   [facility] [address], [address] [place], [place] [address] Phone # [phone] Fax [phone]

## 2024-05-07 ENCOUNTER — Encounter (HOSPITAL_COMMUNITY): Payer: Self-pay | Admitting: Emergency Medicine

## 2024-05-07 ENCOUNTER — Other Ambulatory Visit: Payer: Self-pay

## 2024-05-07 ENCOUNTER — Emergency Department (HOSPITAL_COMMUNITY)

## 2024-05-07 ENCOUNTER — Emergency Department (HOSPITAL_COMMUNITY)
Admission: EM | Admit: 2024-05-07 | Discharge: 2024-05-07 | Disposition: A | Discharge status: Home / Self Care | Attending: Emergency Medicine | Admitting: Emergency Medicine

## 2024-05-07 DIAGNOSIS — K224 Dyskinesia of esophagus: Secondary | ICD-10-CM | POA: Diagnosis not present

## 2024-05-07 DIAGNOSIS — K2900 Acute gastritis without bleeding: Secondary | ICD-10-CM | POA: Insufficient documentation

## 2024-05-07 DIAGNOSIS — R1013 Epigastric pain: Secondary | ICD-10-CM | POA: Diagnosis present

## 2024-05-07 DIAGNOSIS — Z85820 Personal history of malignant melanoma of skin: Secondary | ICD-10-CM | POA: Insufficient documentation

## 2024-05-07 LAB — CBC WITH DIFFERENTIAL/PLATELET
Abs Immature Granulocytes: 0.26 K/uL — ABNORMAL HIGH (ref 0.00–0.07)
Basophils Absolute: 0.1 K/uL (ref 0.0–0.1)
Basophils Relative: 0 %
Eosinophils Absolute: 0.1 K/uL (ref 0.0–0.5)
Eosinophils Relative: 0 %
HCT: 44.4 % (ref 36.0–46.0)
Hemoglobin: 14.3 g/dL (ref 12.0–15.0)
Immature Granulocytes: 2 %
Lymphocytes Relative: 8 %
Lymphs Abs: 1.3 K/uL (ref 0.7–4.0)
MCH: 29.1 pg (ref 26.0–34.0)
MCHC: 32.2 g/dL (ref 30.0–36.0)
MCV: 90.4 fL (ref 80.0–100.0)
Monocytes Absolute: 1.3 K/uL — ABNORMAL HIGH (ref 0.1–1.0)
Monocytes Relative: 8 %
Neutro Abs: 12.7 K/uL — ABNORMAL HIGH (ref 1.7–7.7)
Neutrophils Relative %: 82 %
Platelets: 211 K/uL (ref 150–400)
RBC: 4.91 MIL/uL (ref 3.87–5.11)
RDW: 15.9 % — ABNORMAL HIGH (ref 11.5–15.5)
WBC: 15.7 K/uL — ABNORMAL HIGH (ref 4.0–10.5)
nRBC: 0 % (ref 0.0–0.2)

## 2024-05-07 LAB — TROPONIN I (HIGH SENSITIVITY)
Troponin I (High Sensitivity): 4 ng/L (ref <18)
Troponin I (High Sensitivity): 4 ng/L (ref <18)

## 2024-05-07 LAB — TSH: TSH: 0.715 u[IU]/mL (ref 0.350–4.500)

## 2024-05-07 LAB — COMPREHENSIVE METABOLIC PANEL WITH GFR
ALT: 42 U/L (ref 0–44)
AST: 34 U/L (ref 15–41)
Albumin: 3.4 g/dL — ABNORMAL LOW (ref 3.5–5.0)
Alkaline Phosphatase: 42 U/L (ref 38–126)
Anion gap: 12 (ref 5–15)
BUN: 21 mg/dL — ABNORMAL HIGH (ref 6–20)
CO2: 26 mmol/L (ref 22–32)
Calcium: 8.7 mg/dL — ABNORMAL LOW (ref 8.9–10.3)
Chloride: 104 mmol/L (ref 98–111)
Creatinine, Ser: 0.82 mg/dL (ref 0.44–1.00)
GFR, Estimated: >60 mL/min (ref >60)
Glucose, Bld: 113 mg/dL — ABNORMAL HIGH (ref 70–99)
Potassium: 3.9 mmol/L (ref 3.5–5.1)
Sodium: 142 mmol/L (ref 135–145)
Total Bilirubin: 1.3 mg/dL — ABNORMAL HIGH (ref 0.0–1.2)
Total Protein: 5.7 g/dL — ABNORMAL LOW (ref 6.5–8.1)

## 2024-05-07 LAB — I-STAT CG4 LACTIC ACID, ED
Lactic Acid, Venous: 3.2 mmol/L (ref 0.5–1.9)
Lactic Acid, Venous: 2.7 mmol/L (ref 0.5–1.9)

## 2024-05-07 LAB — LIPASE, BLOOD: Lipase: 27 U/L (ref 11–51)

## 2024-05-07 MED ORDER — ONDANSETRON HCL 4 MG/2ML IJ SOLN
INTRAMUSCULAR | Status: AC
  Filled 2024-05-07: qty 2

## 2024-05-07 MED ORDER — OXYCODONE-ACETAMINOPHEN 5-325 MG PO TABS
1.0000 | ORAL_TABLET | Freq: Once | ORAL | Status: AC
  Administered 2024-05-07: 1 via ORAL
  Filled 2024-05-07: qty 1

## 2024-05-07 MED ORDER — DIPHENHYDRAMINE HCL 50 MG/ML IJ SOLN: 50.0000 mg | Freq: Once | INTRAMUSCULAR | Status: DC

## 2024-05-07 MED ORDER — HYDROMORPHONE HCL 1 MG/ML IJ SOLN
1.0000 mg | Freq: Once | INTRAMUSCULAR | Status: AC
  Administered 2024-05-07: 1 mg via INTRAVENOUS
  Filled 2024-05-07: qty 1

## 2024-05-07 MED ORDER — MORPHINE SULFATE (PF) 4 MG/ML IV SOLN
4.0000 mg | Freq: Once | INTRAVENOUS | Status: AC
  Administered 2024-05-07: 4 mg via INTRAVENOUS
  Filled 2024-05-07: qty 1

## 2024-05-07 MED ORDER — ALUM & MAG HYDROXIDE-SIMETH 200-200-20 MG/5ML PO SUSP
30.0000 mL | Freq: Once | ORAL | Status: AC
  Administered 2024-05-07: 30 mL via ORAL
  Filled 2024-05-07: qty 30

## 2024-05-07 MED ORDER — DICYCLOMINE HCL 10 MG PO CAPS
10.0000 mg | ORAL_CAPSULE | Freq: Once | ORAL | Status: AC
  Administered 2024-05-07: 10 mg via ORAL
  Filled 2024-05-07: qty 1

## 2024-05-07 MED ORDER — LACTATED RINGERS IV BOLUS
1000.0000 mL | Freq: Once | INTRAVENOUS | Status: AC
  Administered 2024-05-07: 1000 mL via INTRAVENOUS

## 2024-05-07 MED ORDER — METHYLPREDNISOLONE SODIUM SUCC 40 MG IJ SOLR
40.0000 mg | Freq: Once | INTRAMUSCULAR | Status: AC
  Administered 2024-05-07: 40 mg via INTRAVENOUS
  Filled 2024-05-07: qty 1

## 2024-05-07 MED ORDER — ONDANSETRON HCL 4 MG/2ML IJ SOLN
4.0000 mg | Freq: Once | INTRAMUSCULAR | Status: AC
  Administered 2024-05-07: 4 mg via INTRAVENOUS

## 2024-05-07 MED ORDER — ONDANSETRON HCL 4 MG/2ML IJ SOLN
4.0000 mg | Freq: Once | INTRAMUSCULAR | Status: AC
  Administered 2024-05-07: 4 mg via INTRAVENOUS
  Filled 2024-05-07: qty 2

## 2024-05-07 MED ORDER — DIPHENHYDRAMINE HCL 25 MG PO CAPS: 50.0000 mg | ORAL_CAPSULE | Freq: Once | ORAL | Status: DC

## 2024-05-07 MED ORDER — ALUM & MAG HYDROXIDE-SIMETH 400-400-40 MG/5ML PO SUSP: 15.0000 mL | Freq: Four times a day (QID) | ORAL | 0 refills | Status: DC | PRN

## 2024-05-07 MED ORDER — LIDOCAINE VISCOUS HCL 2 % MT SOLN
15.0000 mL | Freq: Once | OROMUCOSAL | Status: AC
  Administered 2024-05-07: 15 mL via ORAL
  Filled 2024-05-07: qty 15

## 2024-05-07 MED ORDER — OXYCODONE-ACETAMINOPHEN 5-325 MG PO TABS: 1.0000 | ORAL_TABLET | Freq: Four times a day (QID) | ORAL | 0 refills | Status: DC | PRN

## 2024-05-07 MED ORDER — SODIUM CHLORIDE 0.9 % IV BOLUS
1000.0000 mL | Freq: Once | INTRAVENOUS | Status: AC
  Administered 2024-05-07: 1000 mL via INTRAVENOUS

## 2024-05-07 MED ORDER — DICYCLOMINE HCL 20 MG PO TABS: 20.0000 mg | ORAL_TABLET | Freq: Two times a day (BID) | ORAL | 0 refills | Status: DC

## 2024-05-07 NOTE — ED Provider Notes (Signed)
 [place] EMERGENCY DEPARTMENT AT [facility] Provider Note  MDM   HPI/ROS:  Laurie Terry is a 58 y.o. female with a medical history as below who presents with epigastric abdominal pain that is constant but fluctuating in intensity and worsening since yesterday.  She reports a history of pancreatitis and esophageal spasms but is unsure if either of those was the cause.  She does endorse associated nausea but denies any vomiting.  Physical exam is notable for: - Tenderness to epigastric region.  No peritonitis.  Generally well-appearing.  Equal pulses bilaterally.  On my initial evaluation, patient is:  -Vital signs stable. Patient afebrile, hemodynamically stable, and non-toxic appearing. -Additional history obtained from chart review  Differentials include aortic aneurysm or dissection, appendicitis, Bowel Obstruction, AAA, UTI, Pyelonephritis, Nephrolithiasis, Pancreatitis, Cholecystitis, Shingles, Perforated Bowel or Ulcer, Diverticulosis/itis, Ischemic Mesentery, Inflammatory Bowel Disease, Strangulated/Incarcerated Hernia, PID, Intrauterine Pregnancy, Ectopic Pregnancy, Ovarian Torsion, Tubal Ovarian Abscess, STD.    On my initial assessment here patient is hemodynamically intact.  Her neck exam is reassuring she has equal pulses bilaterally however given her history I am concerned about possible worsening of her aortic aneurysm.  She has a normal lipase but significant epigastric tenderness this groove appendicitis remains on the differential.  She is status postcholecystectomy.  Esophageal spasm also remains on the differential, GI cocktail was given.  Her prominent leukocytosis as below likely secondary to steroid use for recent giant cell arteritis diagnosis.  Her pain resolved with GI cocktail as well as pain medication.  She states she does feel that occasionally and it feels similar to prior esophageal spasms spasms now that the intensity is dulled.  Her workup as below  is extremely reassuring including CT.  We opted for CT without contrast given limitations by contrast allergy.  There is decent visualization of the aorta and pancreas.  Her lipase is normal and I believe that given her relatively young age group pancreatitis it is pretty unlikely.  Her prior pancreatitis was caused by cholelithiasis and she has since had cholecystectomy.  We did discuss the risk and benefits of not fully visualizing her aorta given the known aortic root dilation the patient states understanding and is in agreement with noncontrasted scan.  Given her symptoms have improved and her workup is so reassuring I do believe she is appropriate for discharge.  She was given Bentyl , Maalox and breakthrough pain medication instructed to follow-up with her gastroenterologist.  She was discharged with strict return precautions.  Interpretations, interventions, and the patient's course of care are documented below.    Clinical Course as of 05/07/24 2132  Fri May 07, 2024     1618 Troponin I (High Sensitivity): 4 [RC]  1618 Lipase: 27 [RC]  1643 WBC(!): 15.7 On steroids currently [RC]  1952 Lactic Acid, Venous(!!): 2.7 LR given.  Likely 2/2 dehydration.  Patient not taking p.o. today. [RC]  1953 CT CHEST ABDOMEN PELVIS WO CONTRAST No acute findings [RC]    Clinical Course User Index [RC] [name], MD      Disposition:  I discussed the plan for discharge with the patient and/or their surrogate at bedside prior to discharge and they were in agreement with the plan and verbalized understanding of the return precautions provided. All questions answered to the best of my ability. Ultimately, the patient was discharged in stable condition with stable vital signs. I am reassured that they are capable of close follow up and good social support at home.   Clinical  Impression:  1. Esophageal spasm   2. Acute gastritis without hemorrhage, unspecified gastritis type   3. Epigastric pain      Rx / DC Orders ED Discharge Orders          Ordered    alum & mag hydroxide-simeth (MAALOX PLUS) 400-400-40 MG/5ML suspension  Every 6 hours PRN        05/07/24 1950    dicyclomine  (BENTYL ) 20 MG tablet  2 times daily        05/07/24 1950    oxyCODONE -acetaminophen  (PERCOCET/ROXICET) 5-325 MG tablet  Every 6 hours PRN        05/07/24 1950            The plan for this patient was discussed with Dr. Dean, who voiced agreement and who oversaw evaluation and treatment of this patient.   Clinical Complexity A medically appropriate history, review of systems, and physical exam was performed.  My independent interpretations of EKG, labs, and radiology are documented in the ED course above.   If decision rules were used in this patient's evaluation, they are listed below.   Click here for ABCD2, HEART and other calculatorsREFRESH Note before signing   Patient's presentation is most consistent with acute presentation with potential threat to life or bodily function.  Medical Decision Making Amount and/or Complexity of Data Reviewed Labs: ordered. Decision-making details documented in ED Course. Radiology: ordered. Decision-making details documented in ED Course.  Risk OTC drugs. Prescription drug management.    HPI/ROS      See MDM section for pertinent HPI and ROS. A complete ROS was performed with pertinent positives/negatives noted above.   Past Medical History:  Diagnosis Date   Allergic rhinitis    Aortic aneurysm (HCC) 09/23/2020   Cancer (HCC)    Melanoma   Cervical dysplasia 2007   low-grade SIL, normal Pap smears since then   Family history of adverse reaction to anesthesia    mom has h/o post op nausea/vomiting   Intermittent palpitations    IUD    inserted 03/2002.04/06/2007, 06/03/2012   Ocular migraine    Osteomalacia    Pancreatitis    Seafood allergy     Past Surgical History:  Procedure Laterality Date   BIOPSY  01/21/2022   Procedure:  BIOPSY;  Surgeon: Dianna Specking, MD;  Location: [facility];  Service: Gastroenterology;;   ROMAYNE  2018   CHOLECYSTECTOMY N/A 04/09/2016   Procedure: LAPAROSCOPIC CHOLECYSTECTOMY ;  Surgeon: Donnice Bury, MD;  Location: [facility] OR;  Service: General;  Laterality: N/A;   ERCP  04/11/2016   ERCP with pancreatic stent and failed CBD stent after needle knife and conventional sphincterotomy   ERCP N/A 04/12/2016   Procedure: ENDOSCOPIC RETROGRADE CHOLANGIOPANCREATOGRAPHY (ERCP);  Surgeon: Oliva Boots, MD;  Location: [facility] ENDOSCOPY;  Service: Endoscopy;  Laterality: N/A;   ERCP N/A 01/15/2022   Procedure: ENDOSCOPIC RETROGRADE CHOLANGIOPANCREATOGRAPHY (ERCP);  Surgeon: Boots Oliva, MD;  Location: [facility];  Service: Gastroenterology;  Laterality: N/A;   FLEXIBLE SIGMOIDOSCOPY N/A 01/21/2022   Procedure: FLEXIBLE SIGMOIDOSCOPY;  Surgeon: Dianna Specking, MD;  Location: [facility];  Service: Gastroenterology;  Laterality: N/A;   FOOT SURGERY     toenail   INTRAUTERINE DEVICE INSERTION  08/07/2017   Mirena    IR EXCHANGE BILIARY DRAIN  02/11/2022   IR RADIOLOGIST EVAL & MGMT  02/08/2022   IR RADIOLOGIST EVAL & MGMT  02/20/2022   IR RADIOLOGIST EVAL & MGMT  02/28/2022   removal of melanoma  REMOVAL OF STONES  01/15/2022   Procedure: REMOVAL OF STONES;  Surgeon: Rosalie Kitchens, MD;  Location: [facility];  Service: Gastroenterology;;   ANNETT  01/15/2022   Procedure: ANNETT;  Surgeon: Rosalie Kitchens, MD;  Location: [facility];  Service: Gastroenterology;;      Physical Exam   Vitals:   05/07/24 1441 05/07/24 1838 05/07/24 1900 05/07/24 1915  BP: 94/69 108/65 105/62 112/67  Pulse: (!) 55 (!) 47 (!) 52 (!) 52  Resp: 14 16    Temp:  (!) 97.5 F (36.4 C)    TempSrc:  Oral    SpO2: 100% 99% 95% 98%  Weight:      Height:        Physical Exam Vitals and nursing note reviewed.  Constitutional:      General: She is not in acute distress.    Appearance: She is  well-developed.  HENT:     Head: Normocephalic and atraumatic.  Eyes:     Conjunctiva/sclera: Conjunctivae normal.  Cardiovascular:     Rate and Rhythm: Normal rate and regular rhythm.     Heart sounds: No murmur heard. Pulmonary:     Effort: Pulmonary effort is normal. No respiratory distress.     Breath sounds: Normal breath sounds.  Abdominal:     Palpations: Abdomen is soft.     Tenderness: There is abdominal tenderness in the epigastric area. There is no right CVA tenderness, left CVA tenderness, guarding or rebound.  Musculoskeletal:        General: No swelling.     Cervical back: Neck supple.  Skin:    General: Skin is warm and dry.     Capillary Refill: Capillary refill takes less than 2 seconds.  Neurological:     Mental Status: She is alert.  Psychiatric:        Mood and Affect: Mood normal.      Procedures   If procedures were preformed on this patient, they are listed below:  Procedures   @BBSIG @   Please note that this documentation was produced with the assistance of voice-to-text technology and may contain errors.    [name], MD 05/07/24 2135    Dean Clarity, MD 05/07/24 2258

## 2024-05-07 NOTE — ED Provider Triage Note (Signed)
 Emergency Medicine Provider Triage Evaluation Note  KAYLINA CAHUE , a 58 y.o. female  was evaluated in triage.  Pt complains of severe pain in upper abdomen, chest back and arms with nausea.  Review of Systems  Positive: Chest pain, back pain, shortness of breath, pain going on arms.  Nausea. Negative: Vomiting, constipation, diarrhea, urinary changes, trauma, rashes  Physical Exam  BP 130/73 (BP Location: Right Arm)   Pulse (!) 49   Temp 97.6 F (36.4 C) (Oral)   Resp 18   Ht 5' 5 (1.651 m)   Wt 68.9 kg   SpO2 100%   BMI 25.29 kg/m  Gen:   Awake, no distress  Resp:  Normal effort  MSK:   Moves extremities without difficulty  Other:  Dry mouth, tenderness across the abdomen and lower chest.  No tenderness in back.  Intact pulses in extremities.  No focal neurologic deficits.  Medical Decision Making  Medically screening exam initiated at 12:08 PM.  Appropriate orders placed.  Tayra Dawe Wilner was informed that the remainder of the evaluation will be completed by another provider, this initial triage assessment does not replace that evaluation, and the importance of remaining in the ED until their evaluation is complete.   MEKESHA SOLOMON is a 58 y.o. female with a past medical history significant for previous pancreatitis, previous cholecystectomy, SVT, aortic dilation, previous melanoma, and patient report of Gines arteritis currently on steroids and antibiotics who presents with severe abdominal pain, back pain, chest pain, shortness breath and nausea.  According to patient, since yesterday, she had onset of severe 10 out of 10 pain in her upper abdomen that goes to her back and chest.  Reports it goes down her arms.  She reports it feels similar to when she had pancreatitis in the past.  She has had nausea but no vomiting.  She is feeling fatigued.  She reports no trauma or rashes due to shingles.  She denies any constipation diarrhea or urinary changes.  Denies any leg  pain or leg swelling.  She also has documented history of dilated aortic root concerning for aneurysm and has a contrast allergy.  She denies history of blood clots but cannot get CT contrast.  On exam, patient is quite tender in her abdomen in the upper abdomen and some in the lower chest.  Back and flanks were nontender.  She had intact pulses.  She was bradycardic with no STEMI on initial EKG.  She is not significantly hypertensive.  Vital signs otherwise reassuring.  Given the patient's discomfort we will give pain medicine nausea medicine and fluids.  She will remain NPO.  Will order chest x-ray and cardiac workup with troponin to start with and will get labs for her abdominal pain.  With her aortic history and her pleuritic symptoms, she will need to be seen by provider in the back to determine if other imaging will be necessary.  Appears patient has had different types of MRA and MRI to evaluate her aorta and her chest and abdomen.  She cannot get CT contrast so do not feel she is appropriate to get a PE study or dissection study at this time.  Anticipate further evaluation by provider to determine management.    Emmanuelle Hibbitts, Lonni PARAS, MD 05/07/24 1213

## 2024-05-07 NOTE — ED Triage Notes (Signed)
 Pt arrived from home reported 10/10 pain in the epigastric region and specifically at the bottom of her sternum that started yesterday. Pt states that it hurts to breath and the pain radiating to both arms, back, and neck. Pt states she has a hx of pancreatitis. She also is taking prednisone  and an antibiotic. Pt took a zofran  to help with nausea. Pt has hx of aortic aneurysm.

## 2024-05-07 NOTE — ED Notes (Signed)
 L-stat lac 2.71

## 2024-05-07 NOTE — ED Notes (Signed)
 Extra SST & DG tube drawn

## 2024-05-07 NOTE — Discharge Instructions (Addendum)
 You were seen today for abdominal pain. While you were here we monitored your vitals, preformed a physical exam, and labs and CT. These were all reassuring and there is no indication for any further testing or intervention in the emergency department at this time.   Things to do:  - Follow up with your primary care provider within the next 1-2 weeks  Return to the emergency department if you have any new or worsening symptoms including worsening pain, nausea vomiting, sweating, inability to tolerate food or drink, or if you have any other concerns.

## 2024-05-10 ENCOUNTER — Ambulatory Visit: Admitting: Physical Therapy

## 2024-05-12 ENCOUNTER — Encounter: Admitting: Physical Therapy

## 2024-05-12 ENCOUNTER — Other Ambulatory Visit: Payer: Self-pay

## 2024-05-12 DIAGNOSIS — K219 Gastro-esophageal reflux disease without esophagitis: Secondary | ICD-10-CM

## 2024-05-12 DIAGNOSIS — R0789 Other chest pain: Secondary | ICD-10-CM

## 2024-05-19 ENCOUNTER — Ambulatory Visit: Admitting: Physical Therapy

## 2024-05-19 DIAGNOSIS — M6281 Muscle weakness (generalized): Secondary | ICD-10-CM

## 2024-05-19 DIAGNOSIS — D696 Thrombocytopenia, unspecified: Secondary | ICD-10-CM | POA: Diagnosis not present

## 2024-05-19 DIAGNOSIS — A4159 Other Gram-negative sepsis: Secondary | ICD-10-CM | POA: Diagnosis not present

## 2024-05-19 DIAGNOSIS — R279 Unspecified lack of coordination: Secondary | ICD-10-CM

## 2024-05-19 DIAGNOSIS — R293 Abnormal posture: Secondary | ICD-10-CM

## 2024-05-19 NOTE — Therapy (Signed)
 OUTPATIENT PHYSICAL THERAPY FEMALE PELVIC TREATMENT   Patient Name: Laurie Terry MRN: [id number] DOB:05-May-1966, 58 y.o., female Today's Date: 05/19/2024  END OF SESSION:  PT End of Session - 05/19/24 0839     Visit Number 4    Number of Visits 10    Date for PT Re-Evaluation 04/01/24    Authorization Type [employer]    Authorization - Number of Visits 20   auth req after 20   PT Start Time 0800    PT Stop Time 0840    PT Time Calculation (min) 40 min    Activity Tolerance Patient tolerated treatment well    Behavior During Therapy [facility] for tasks assessed/performed            Past Medical History:  Diagnosis Date   Allergic rhinitis    Aortic aneurysm (HCC) 09/23/2020   Cancer (HCC)    Melanoma   Cervical dysplasia 2007   low-grade SIL, normal Pap smears since then   Family history of adverse reaction to anesthesia    mom has h/o post op nausea/vomiting   Intermittent palpitations    IUD    inserted 03/2002.04/06/2007, 06/03/2012   Ocular migraine    Osteomalacia    Pancreatitis    Seafood allergy    Past Surgical History:  Procedure Laterality Date   BIOPSY  01/21/2022   Procedure: BIOPSY;  Surgeon: Dianna Specking, MD;  Location: [facility];  Service: Gastroenterology;;   ROMAYNE  2018   CHOLECYSTECTOMY N/A 04/09/2016   Procedure: LAPAROSCOPIC CHOLECYSTECTOMY ;  Surgeon: Donnice Bury, MD;  Location: [facility] OR;  Service: General;  Laterality: N/A;   ERCP  04/11/2016   ERCP with pancreatic stent and failed CBD stent after needle knife and conventional sphincterotomy   ERCP N/A 04/12/2016   Procedure: ENDOSCOPIC RETROGRADE CHOLANGIOPANCREATOGRAPHY (ERCP);  Surgeon: Oliva Boots, MD;  Location: [facility];  Service: Endoscopy;  Laterality: N/A;   ERCP N/A 01/15/2022   Procedure: ENDOSCOPIC RETROGRADE CHOLANGIOPANCREATOGRAPHY (ERCP);  Surgeon: Boots Oliva, MD;  Location: [facility];  Service: Gastroenterology;  Laterality: N/A;   FLEXIBLE  SIGMOIDOSCOPY N/A 01/21/2022   Procedure: FLEXIBLE SIGMOIDOSCOPY;  Surgeon: Dianna Specking, MD;  Location: [facility];  Service: Gastroenterology;  Laterality: N/A;   FOOT SURGERY     toenail   INTRAUTERINE DEVICE INSERTION  08/07/2017   Mirena    IR EXCHANGE BILIARY DRAIN  02/11/2022   IR RADIOLOGIST EVAL & MGMT  02/08/2022   IR RADIOLOGIST EVAL & MGMT  02/20/2022   IR RADIOLOGIST EVAL & MGMT  02/28/2022   removal of melanoma     REMOVAL OF STONES  01/15/2022   Procedure: REMOVAL OF STONES;  Surgeon: Boots Oliva, MD;  Location: [facility];  Service: Gastroenterology;;   ANNETT  01/15/2022   Procedure: ANNETT;  Surgeon: Boots Oliva, MD;  Location: [facility];  Service: Gastroenterology;;   Patient Active Problem List   Diagnosis Date Noted   Dilatation of aorta (HCC) 01/24/2023   Perinephric abscess 03/03/2022   Norovirus 02/15/2022   Dry cough 02/02/2022   Iatrogenic subdiaphragmatic abscess (HCC) 02/02/2022   Pleural effusion on right    Post-ERCP acute pancreatitis 01/16/2022   GERD without esophagitis 01/16/2022   Choledocholithiasis 01/16/2022   Adverse effect of other drugs, medicaments and biological substances, subsequent encounter 05/23/2021   Other allergic rhinitis 05/23/2021   Allergic conjunctivitis of both eyes 05/23/2021   History of asthma 05/23/2021   Oral allergy syndrome, subsequent encounter 05/23/2021   Family history of bicuspid aortic  valve 11/28/2020   Other hyperlipidemia 11/28/2020   PAC (premature atrial contraction) 10/30/2020   Postoperative bile leak 04/12/2016   Acute hypokalemia 04/12/2016   Paroxysmal SVT (supraventricular tachycardia) (HCC) 04/12/2016   Bile leak, postoperative 04/12/2016   S/P laparoscopic cholecystectomy    History of Gallstone pancreatitis 04/07/2016   Pancreatitis due to biliary obstruction 04/07/2016   Cancer (HCC)    Osteopenia     PCP: Sari Mcbride   REFERRING PROVIDER: Zuleta, Kaitlin G,  NP   REFERRING DIAG: R35.0 (ICD-10-CM) - Urinary frequency R82.998 (ICD-10-CM) - Leukocytes in urine N39.41 (ICD-10-CM) - Urge incontinence of urine R15.1 (ICD-10-CM) - Fecal smearing  THERAPY DIAG:  Muscle weakness (generalized)  Abnormal posture  Unspecified lack of coordination  Rationale for Evaluation and Treatment: Rehabilitation  ONSET DATE: within the last year or so   SUBJECTIVE:                                                                                                                                                                                           SUBJECTIVE STATEMENT:  She reports that she is feeling well today. She is still learning more about her autoimmune symptoms - she has markers for RA and is trying to navigate this with tapering her prednisone . Urinary leakage has been better - she is not voiding as much as night and she gives credit to the urge drill for this. She had some constipation last week but other than that she has been having regular bowel movements. She has not had any fecal smearing in her underwear, she is able to tell when she needs to go. She started using her estradiol  2x/wk currently.   She has fecal incontinence that are small pieces that just fall out.  Was recently diagnosed with polymyalgia rheumatica  PAIN:  Are you having pain? No NPRS scale: 0/10  PRECAUTIONS: None  RED FLAGS: None   WEIGHT BEARING RESTRICTIONS: No  FALLS:  Has patient fallen in last 6 months? 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  Sexual abuse: No  BOWEL MOVEMENT: Pain with bowel movement: No Type of bowel movement:Type (Bristol Stool Scale) 4-5, Frequency 3-4x/week,  Strain no, and Splinting no Fully empty rectum: Yes: it depends on if its a large BM or not  Leakage: Yes: on days when she is not having a bowel movement  Pads: No Fiber supplement/laxative Yes - consumes natural fiber in her foods   URINATION: Pain with urination: No Fully empty bladder: Yes: testing has shown otherwise though - she feels like she empties fully  Stream: Strong Urgency: Yes  Frequency: within normal limits - gets up 1-3x/night  Leakage: Walking to the bathroom Pads: No  INTERCOURSE: currently sexually active   Ability to have vaginal penetration Yes  Pain with intercourse: none DrynessYes - has a vaginal estrogen cream  Climax: yes Marinoff Scale: 1/3  PREGNANCY: Vaginal deliveries 4, 9lb babies Tearing No Episiotomy Yes  C-section deliveries 0 Currently pregnant No  PROLAPSE: None  OBJECTIVE:  Note: Objective measures were completed at Evaluation unless otherwise noted.  PATIENT SURVEYS:  PFIQ-7: 45  COGNITION: Overall cognitive status: Within functional limits for tasks assessed     SENSATION: Light touch: Appears intact  LUMBAR SPECIAL TESTS:  Single leg stance test: Positive  FUNCTIONAL TESTS:  Squat test: bilateral dynamic knee valgus with loading, general lumbopelvic stiffness present   GAIT: Assistive device utilized: None Comments: mild trendelenburg gait pattern with ambulation   POSTURE: rounded shoulders and forward head  LUMBARAROM/PROM: within normal limits   A/PROM A/PROM  eval  Flexion 100%  Extension 100%  Right lateral flexion 100%  Left lateral flexion 100%  Right rotation 100%  Left rotation 100%   (Blank rows = not tested)  LOWER EXTREMITY ROM: within functional limits   LOWER EXTREMITY MMT: 4/5 bilateral knees and hips grossly  PALPATION:   General: no  significant tenderness to glute medius/max/piriformis/coccyx/sacrum  Pelvic Alignment: within normal limits   Abdominal: upper chest breathing and abdominal bracing present at rest, patient demonstrates sufficient lower rib mobility for deep diaphragmatic breathing                 External Perineal Exam: dryness present around EAS, anal wink reflex positive                             Internal Pelvic Floor: Patient fully consents to today's rectal examination of her pelvic floor musculature. She had no pain during today's examination. She demonstrates weakness at external and internal anal sphincters, only able to achieve a weak 3/5 contraction in sidelying. She is able to create a stronger (4/5) contraction when she pairs an exhale with her contraction. She is stiff throughout the rectal musculature, most likely from not utilizing the full range of motion of the rectal muscles. No increase in overall tone following assessment. No fecal smearing present on glove.  Patient confirms identification and approves PT to assess internal pelvic floor and treatment Yes No emotional/communication barriers or cognitive limitation. Patient is motivated to learn. Patient understands and agrees with treatment goals and plan. PT explains patient will be examined in standing, sitting, and lying down to see how their muscles and joints work. When they are ready, they will be asked to remove their underwear so PT can examine their perineum. The patient is also given the option of providing their own chaperone as one is not provided in our facility. The patient also has the right and is explained the right to defer or refuse any part of the evaluation or treatment including the internal exam. With the patient's consent, PT  will use one gloved finger to gently assess the muscles of the pelvic floor, seeing how well it contracts and relaxes and if there is muscle symmetry. After, the patient will get dressed and PT and  patient will discuss exam findings and plan of care. PT and patient discuss plan of care, schedule, attendance policy and HEP activities.  PELVIC MMT:   MMT eval 05/04/24  Vaginal  4/5, 10 quick flicks   Internal Anal Sphincter 3/5   External Anal Sphincter 3/5   Puborectalis 3/5   Diastasis Recti    (Blank rows = not tested)       TONE: Decreased throughout superficial and deep rectal musculature. 05/04/24: normal tone throughout superficial and deep pelvic floor musculature bilaterally   PROLAPSE: N/A  TODAY'S TREATMENT:                                                                                                                              DATE:   04/29/24: Pt educated on voiding mechanics, step stool, urge drill, bladder retraining and updated HEP.  Pt benefited from extra time for education of these topics as she has urine and fecal incontinence and time spent answering all questions and insuring she has full understanding on how to implement all techniques - pt denied questions at end of session and reports understanding 2x10 pelvic floor contractions - improved awareness and feeling of pelvic floor mobility with towel roll in sitting and cues for slight posterior weight shift to improve sensation of posterior pelvic floor   05/04/24: Patient needs to leave session early today to make it to work Neuro re-ed/manual therapy: Hooklying diaphragmatic breathing + pelvic floor lengthening with inhalation and shortening with exhalation 3x10  Internal vaginal muscle cueing for lengthening with inhalation + shortening with exhalation  Quick flick contractions + diaphragmatic breathing 2x10  Internal pelvic floor review of coordination-based exercises to ensure patient is performing contractions correctly  Self care: Relative anatomy and the connection between the diaphragm and pelvic floor, how to contract the pelvic floor vs relaxing the musculature Review of urge drill/squatty potty  mechanics   05/19/24: Neuro re-ed: seated diaphragmatic breathing + pelvic floor lengthening with inhalation and shortening with exhalation 3x10  Seated Quick flick contractions + diaphragmatic breathing 2x10  Therapeutic exercise: Sit to stand + adductor ball squeeze + diaphragmatic breathing 2x10  Bridge + adductor ball squeeze + diaphragmatic breathing 2x10  Sidelying clamshell + reverse clamshell + diaphragmatic breathing 2x10  Supine butterfly stretch + diaphragmatic breathing 2x76min  Childs pose + diaphragmatic breathing 2x60min  Self care: Relative anatomy and the connection between the diaphragm and pelvic floor, how to contract the pelvic floor vs relaxing the musculature Review of urge drill/squatty potty mechanics   PATIENT EDUCATION:  Education details: Relative anatomy and the connection between the diaphragm and pelvic floor, how to contract the pelvic floor vs relaxing the musculature Person educated: Patient Education method: Explanation, Demonstration, Tactile cues, Verbal  cues, and Handouts Education comprehension: verbalized understanding, returned demonstration, verbal cues required, tactile cues required, and needs further education  HOME EXERCISE PROGRAM: Access Code: [email or web address] URL: [email or web address] Date: 05/19/2024 Prepared by: Celena Domino  Exercises - Seated Pelvic Floor Contraction  - 1 x daily - 7 x weekly - 2 sets - 10 reps - Seated Quick Flick Pelvic Floor Contractions  - 1 x daily - 7 x weekly - 2 sets - 10 reps - pelvic floor contract and holds  - 1 x daily - 7 x weekly - 3 sets - 10 reps - 15s holds - Sit to Stand with Mercer Between Knees  - 1 x daily - 7 x weekly - 2 sets - 10 reps - Supine Bridge with Mini Swiss Ball Between Knees  - 1 x daily - 7 x weekly - 2 sets - 10 reps - Clamshell  - 1 x daily - 7 x weekly - 2 sets - 10 reps - Sidelying Reverse Clamshell  - 1 x daily - 7 x weekly - 2 sets - 10 reps - Supine Butterfly Groin  Stretch  - 1 x daily - 7 x weekly - 2 sets - 1 min hold - Child's Pose Stretch  - 1 x daily - 7 x weekly - 2 sets - hold  ASSESSMENT:  CLINICAL IMPRESSION: Patient is a 58 y.o. female who was seen today for physical therapy  treatment for urinary frequency/urge and fecal incontinence. Patient has been doing better with urinary control, so we progressed her pelvic training to seated position today for increased gravitational load. External pelvic floor strengthening introduced today and patient tolerated this very well. Pt very motivated to implement recommendations and HEP, felt very good at end of session. Overall, patient tolerated session well and Pt would benefit from additional PT to further address deficits.    OBJECTIVE IMPAIRMENTS: decreased coordination, decreased endurance, decreased mobility, decreased ROM, and decreased strength.   ACTIVITY LIMITATIONS: continence  PARTICIPATION LIMITATIONS: N/A  PERSONAL FACTORS: Age, Past/current experiences, and Time since onset of injury/illness/exacerbation are also affecting patient's functional outcome.   REHAB POTENTIAL: Good  CLINICAL DECISION MAKING: Stable/uncomplicated  EVALUATION COMPLEXITY: Low   GOALS: Goals reviewed with patient? Yes  SHORT TERM GOALS: Target date: 04/01/2024  Pt will be independent with HEP.  Baseline: Goal status: INITIAL  2.  Pt will be independent with diaphragmatic breathing and down training activities in order to improve pelvic floor relaxation. Baseline:  Goal status: INITIAL  3.  Pt will be independent with use of squatty potty, relaxed toileting mechanics, and improved bowel movement techniques in order to increase ease of bowel movements and complete evacuation.   Baseline:  Goal status: INITIAL  4.  Pt will be independent with the knack, urge suppression technique, and double voiding in order to improve bladder habits and decrease urinary incontinence.   Baseline:  Goal status:  INITIAL   LONG TERM GOALS: Target date: 09/03/2024  Pt will be independent with advanced HEP.  Baseline:  Goal status: INITIAL  2.  Pt to demonstrate improved coordination of pelvic floor and breathing mechanics with 10# squat with appropriate synergistic patterns to decrease leakage at least 75% of the time for improved ability to complete a 30 minute workout with strain at pelvic floor and symptoms.   Baseline:  Goal status: INITIAL  3.  Pt will report her BMs are complete due to improved bowel habits and evacuation techniques to improve quality of life. Baseline:  Goal  status: INITIAL  4.  Pt will be able to teach back and utilize urge suppression technique in order to help reduce number of trips to the bathroom and to decrease urge urinary incontinence when trying to make it to the bathroom. Baseline:  Goal status: INITIAL  PLAN:  PT FREQUENCY: 1-2x/week  PT DURATION: 12 weeks  PLANNED INTERVENTIONS: 97110-Therapeutic exercises, 97530- Therapeutic activity, 97112- Neuromuscular re-education, 97535- Self Care, [id number]- Manual therapy, Patient/Family education, Taping, Joint mobilization, Spinal mobilization, Scar mobilization, Cryotherapy, and Moist heat  PLAN FOR NEXT SESSION: continued pelvic lfoor AROM training in seated position, introduce toileting mechanics for complete emptying, manual to rectal/vaginal musculature, education surrounding urge suppression   Celena Domino, PT, DPT 05/19/24 8:40 AM   [facility] [address] [place], [place] [address] Phone # [phone] Fax [phone]

## 2024-05-21 ENCOUNTER — Encounter (HOSPITAL_COMMUNITY): Payer: Self-pay | Admitting: Emergency Medicine

## 2024-05-21 ENCOUNTER — Inpatient Hospital Stay (HOSPITAL_COMMUNITY)
Admission: EM | Admit: 2024-05-21 | Discharge: 2024-05-30 | DRG: 871 | Disposition: A | Discharge status: Home / Self Care | Source: Ambulatory Visit | Attending: Internal Medicine | Admitting: Internal Medicine

## 2024-05-21 ENCOUNTER — Other Ambulatory Visit: Payer: Self-pay

## 2024-05-21 DIAGNOSIS — E663 Overweight: Secondary | ICD-10-CM | POA: Diagnosis present

## 2024-05-21 DIAGNOSIS — D6959 Other secondary thrombocytopenia: Secondary | ICD-10-CM | POA: Diagnosis present

## 2024-05-21 DIAGNOSIS — Z91018 Allergy to other foods: Secondary | ICD-10-CM

## 2024-05-21 DIAGNOSIS — R001 Bradycardia, unspecified: Secondary | ICD-10-CM

## 2024-05-21 DIAGNOSIS — D649 Anemia, unspecified: Secondary | ICD-10-CM | POA: Insufficient documentation

## 2024-05-21 DIAGNOSIS — B965 Pseudomonas (aeruginosa) (mallei) (pseudomallei) as the cause of diseases classified elsewhere: Secondary | ICD-10-CM

## 2024-05-21 DIAGNOSIS — I4589 Other specified conduction disorders: Secondary | ICD-10-CM | POA: Diagnosis present

## 2024-05-21 DIAGNOSIS — M315 Giant cell arteritis with polymyalgia rheumatica: Secondary | ICD-10-CM | POA: Diagnosis present

## 2024-05-21 DIAGNOSIS — D849 Immunodeficiency, unspecified: Secondary | ICD-10-CM | POA: Diagnosis present

## 2024-05-21 DIAGNOSIS — Z881 Allergy status to other antibiotic agents status: Secondary | ICD-10-CM

## 2024-05-21 DIAGNOSIS — R7881 Bacteremia: Secondary | ICD-10-CM

## 2024-05-21 DIAGNOSIS — Z823 Family history of stroke: Secondary | ICD-10-CM

## 2024-05-21 DIAGNOSIS — Z882 Allergy status to sulfonamides status: Secondary | ICD-10-CM

## 2024-05-21 DIAGNOSIS — E8809 Other disorders of plasma-protein metabolism, not elsewhere classified: Secondary | ICD-10-CM | POA: Diagnosis present

## 2024-05-21 DIAGNOSIS — M869 Osteomyelitis, unspecified: Secondary | ICD-10-CM | POA: Diagnosis present

## 2024-05-21 DIAGNOSIS — Z803 Family history of malignant neoplasm of breast: Secondary | ICD-10-CM

## 2024-05-21 DIAGNOSIS — R7303 Prediabetes: Secondary | ICD-10-CM | POA: Diagnosis present

## 2024-05-21 DIAGNOSIS — E872 Acidosis, unspecified: Secondary | ICD-10-CM | POA: Diagnosis present

## 2024-05-21 DIAGNOSIS — Z87891 Personal history of nicotine dependence: Secondary | ICD-10-CM

## 2024-05-21 DIAGNOSIS — Z7952 Long term (current) use of systemic steroids: Secondary | ICD-10-CM

## 2024-05-21 DIAGNOSIS — Z91041 Radiographic dye allergy status: Secondary | ICD-10-CM

## 2024-05-21 DIAGNOSIS — Z8261 Family history of arthritis: Secondary | ICD-10-CM

## 2024-05-21 DIAGNOSIS — Z79899 Other long term (current) drug therapy: Secondary | ICD-10-CM

## 2024-05-21 DIAGNOSIS — T380X5A Adverse effect of glucocorticoids and synthetic analogues, initial encounter: Secondary | ICD-10-CM | POA: Diagnosis present

## 2024-05-21 DIAGNOSIS — Z888 Allergy status to other drugs, medicaments and biological substances status: Secondary | ICD-10-CM

## 2024-05-21 DIAGNOSIS — K219 Gastro-esophageal reflux disease without esophagitis: Secondary | ICD-10-CM

## 2024-05-21 DIAGNOSIS — R6521 Severe sepsis with septic shock: Secondary | ICD-10-CM | POA: Diagnosis present

## 2024-05-21 DIAGNOSIS — J189 Pneumonia, unspecified organism: Secondary | ICD-10-CM | POA: Diagnosis present

## 2024-05-21 DIAGNOSIS — Z85820 Personal history of malignant melanoma of skin: Secondary | ICD-10-CM

## 2024-05-21 DIAGNOSIS — K750 Abscess of liver: Secondary | ICD-10-CM | POA: Diagnosis present

## 2024-05-21 DIAGNOSIS — M899 Disorder of bone, unspecified: Secondary | ICD-10-CM | POA: Insufficient documentation

## 2024-05-21 DIAGNOSIS — D696 Thrombocytopenia, unspecified: Principal | ICD-10-CM | POA: Diagnosis present

## 2024-05-21 DIAGNOSIS — B961 Klebsiella pneumoniae [K. pneumoniae] as the cause of diseases classified elsewhere: Secondary | ICD-10-CM

## 2024-05-21 DIAGNOSIS — K851 Biliary acute pancreatitis without necrosis or infection: Secondary | ICD-10-CM

## 2024-05-21 DIAGNOSIS — M609 Myositis, unspecified: Secondary | ICD-10-CM | POA: Diagnosis present

## 2024-05-21 DIAGNOSIS — Z6828 Body mass index (BMI) 28.0-28.9, adult: Secondary | ICD-10-CM

## 2024-05-21 DIAGNOSIS — Z8349 Family history of other endocrine, nutritional and metabolic diseases: Secondary | ICD-10-CM

## 2024-05-21 DIAGNOSIS — A419 Sepsis, unspecified organism: Secondary | ICD-10-CM

## 2024-05-21 DIAGNOSIS — D693 Immune thrombocytopenic purpura: Secondary | ICD-10-CM | POA: Diagnosis present

## 2024-05-21 DIAGNOSIS — A4159 Other Gram-negative sepsis: Principal | ICD-10-CM | POA: Diagnosis present

## 2024-05-21 DIAGNOSIS — Z91013 Allergy to seafood: Secondary | ICD-10-CM

## 2024-05-21 LAB — URINALYSIS, ROUTINE W REFLEX MICROSCOPIC
Bilirubin Urine: NEGATIVE
Glucose, UA: >=500 mg/dL — AB
Hgb urine dipstick: NEGATIVE
Ketones, ur: NEGATIVE mg/dL
Nitrite: NEGATIVE
Protein, ur: NEGATIVE mg/dL
Specific Gravity, Urine: 1.02 (ref 1.005–1.030)
pH: 5 (ref 5.0–8.0)

## 2024-05-21 LAB — COMPREHENSIVE METABOLIC PANEL WITH GFR
ALT: 30 U/L (ref 0–44)
AST: 16 U/L (ref 15–41)
Albumin: 2.6 g/dL — ABNORMAL LOW (ref 3.5–5.0)
Alkaline Phosphatase: 140 U/L — ABNORMAL HIGH (ref 38–126)
Anion gap: 15 (ref 5–15)
BUN: 27 mg/dL — ABNORMAL HIGH (ref 6–20)
CO2: 20 mmol/L — ABNORMAL LOW (ref 22–32)
Calcium: 8.1 mg/dL — ABNORMAL LOW (ref 8.9–10.3)
Chloride: 100 mmol/L (ref 98–111)
Creatinine, Ser: 0.71 mg/dL (ref 0.44–1.00)
GFR, Estimated: >60 mL/min (ref >60)
Glucose, Bld: 319 mg/dL — ABNORMAL HIGH (ref 70–99)
Potassium: 4 mmol/L (ref 3.5–5.1)
Sodium: 135 mmol/L (ref 135–145)
Total Bilirubin: 0.9 mg/dL (ref 0.0–1.2)
Total Protein: 4.9 g/dL — ABNORMAL LOW (ref 6.5–8.1)

## 2024-05-21 LAB — CBC
HCT: 39.4 % (ref 36.0–46.0)
Hemoglobin: 12.7 g/dL (ref 12.0–15.0)
MCH: 28.5 pg (ref 26.0–34.0)
MCHC: 32.2 g/dL (ref 30.0–36.0)
MCV: 88.5 fL (ref 80.0–100.0)
Platelets: 23 K/uL — CL (ref 150–400)
RBC: 4.45 MIL/uL (ref 3.87–5.11)
RDW: 17.2 % — ABNORMAL HIGH (ref 11.5–15.5)
WBC: 10.4 K/uL (ref 4.0–10.5)
nRBC: 0 % (ref 0.0–0.2)

## 2024-05-21 MED ORDER — SODIUM CHLORIDE 0.9 % IV BOLUS
1000.0000 mL | Freq: Once | INTRAVENOUS | Status: AC
  Administered 2024-05-21: 1000 mL via INTRAVENOUS

## 2024-05-21 MED ORDER — SODIUM CHLORIDE 0.9 % IV BOLUS
500.0000 mL | Freq: Once | INTRAVENOUS | Status: AC
  Administered 2024-05-21: 500 mL via INTRAVENOUS

## 2024-05-21 NOTE — ED Triage Notes (Signed)
 Patient c/o weakness x 1 week. Patient was seen by PCP today and recommended to be seen in ED for low platelet count. Patient report nausea, denies vomiting. Patient denies Chest pain and SOB.

## 2024-05-21 NOTE — ED Notes (Signed)
 Patient resting comfortably, warm blanket provided.  Spouse is at bedside.  Lights dimmed in room.

## 2024-05-21 NOTE — ED Provider Notes (Addendum)
 [place] EMERGENCY DEPARTMENT AT [facility] Provider Note   CSN: [id number] Arrival date & time: 05/21/24  1724     Patient presents with: Abnormal labs   Laurie Terry is a 58 y.o. female.   HPI   58 year old female presents emergency department concern for thrombocytopenia on outpatient blood work.  Of note patient has diagnosis of PMR and giant cell arteritis on high-dose steroids.  She had outpatient blood work done that noted the changes and she was instructed to come in.  She admits to general decline over the past week or so after she was seen for esophageal spasm.  She has had decreased appetite.  She has had small amount of bleeding at the blood draw site but otherwise denies any nosebleeds, gum bleeding.  No recent fever or other acute symptoms.  Prior to Admission medications   Medication Sig Start Date End Date Taking? Authorizing Provider  Adapalene 0.3 % gel Apply topically daily. 04/29/22   [provider]  alum & mag hydroxide-simeth (MAALOX PLUS) 400-400-40 MG/5ML suspension Take 15 mLs by mouth every 6 (six) hours as needed for indigestion. 05/07/24   [name], MD  dicyclomine  (BENTYL ) 20 MG tablet Take 1 tablet (20 mg total) by mouth 2 (two) times daily. 05/07/24   [name], MD  EPINEPHrine  0.3 mg/0.3 mL IJ SOAJ injection Inject 0.3 mg into the muscle as needed for anaphylaxis. 10/08/22   Tobie Arleta SQUIBB, MD  estradiol  (ESTRACE ) 0.1 MG/GM vaginal cream Place 0.5 g vaginally 2 (two) times a week. Place 0.5g nightly for two weeks then twice a week after 01/08/24   Zuleta, Kaitlin G, NP  famotidine  (PEPCID ) 20 MG tablet Take 20 mg by mouth daily as needed for heartburn or indigestion.    [provider]  fluticasone  (FLONASE ) 50 MCG/ACT nasal spray Place 2 sprays into both nostrils daily. 10/08/22   Tobie Arleta SQUIBB, MD  metoprolol  tartrate (LOPRESSOR ) 25 MG tablet Take 1 tablet (25 mg total) by mouth daily as needed  (palpitations). 01/27/24   Santo Stanly LABOR, MD  nitrofurantoin , macrocrystal-monohydrate, (MACROBID ) 100 MG capsule 1 capsule with food Orally every 12 hrs for 7 days Patient not taking: Reported on 04/06/2024 10/23/23   [provider]  nitrofurantoin , macrocrystal-monohydrate, (MACROBID ) 100 MG capsule Take 1 capsule (100 mg total) by mouth 2 (two) times daily. Patient not taking: Reported on 04/06/2024 02/26/24   Zuleta, Kaitlin G, NP  oxyCODONE -acetaminophen  (PERCOCET/ROXICET) 5-325 MG tablet Take 1 tablet by mouth every 6 (six) hours as needed for severe pain (pain score 7-10). 05/07/24   [name], MD    Allergies: Contrast media [iodinated contrast media], Iodine, Other, Augmentin  [amoxicillin -pot clavulanate], Avocado, Levaquin [levofloxacin], Levofloxacin, Pantoprazole , Vancomycin , and Colestipol  hcl    Review of Systems  Constitutional:  Positive for appetite change and fatigue. Negative for fever.  Respiratory:  Negative for shortness of breath.   Cardiovascular:  Negative for chest pain.  Gastrointestinal:  Negative for abdominal pain, diarrhea and vomiting.  Skin:  Negative for rash.  Neurological:  Negative for headaches.    Updated Vital Signs BP (!) 87/67   Pulse 79   Temp 98.2 F (36.8 C) (Oral)   Resp 14   SpO2 98%   Physical Exam Vitals and nursing note reviewed.  Constitutional:      General: She is not in acute distress.    Appearance: Normal appearance. She is not ill-appearing.  HENT:     Head: Normocephalic.  Mouth/Throat:     Mouth: Mucous membranes are moist.  Cardiovascular:     Rate and Rhythm: Normal rate.  Pulmonary:     Effort: Pulmonary effort is normal. No respiratory distress.  Abdominal:     Palpations: Abdomen is soft.     Tenderness: There is no abdominal tenderness.  Skin:    General: Skin is warm.  Neurological:     Mental Status: She is alert and oriented to person, place, and time. Mental status is at  baseline.  Psychiatric:        Mood and Affect: Mood normal.     (all labs ordered are listed, but only abnormal results are displayed) Labs Reviewed  COMPREHENSIVE METABOLIC PANEL WITH GFR - Abnormal; Notable for the following components:      Result Value   CO2 20 (*)    Glucose, Bld 319 (*)    BUN 27 (*)    Calcium 8.1 (*)    Total Protein 4.9 (*)    Albumin 2.6 (*)    Alkaline Phosphatase 140 (*)    All other components within normal limits  CBC - Abnormal; Notable for the following components:   RDW 17.2 (*)    Platelets 23 (*)    All other components within normal limits  URINALYSIS, ROUTINE W REFLEX MICROSCOPIC - Abnormal; Notable for the following components:   Color, Urine AMBER (*)    APPearance HAZY (*)    Glucose, UA >=500 (*)    Leukocytes,Ua LARGE (*)    Bacteria, UA RARE (*)    Non Squamous Epithelial 0-5 (*)    All other components within normal limits    EKG: None  Radiology: No results found.   .Critical Care  Performed by: Bari Roxie HERO, DO Authorized by: Bari Roxie HERO, DO   Critical care provider statement:    Critical care time (minutes):  30   Critical care time was exclusive of:  Separately billable procedures and treating other patients   Critical care was necessary to treat or prevent imminent or life-threatening deterioration of the following conditions:  Dehydration and metabolic crisis   Critical care was time spent personally by me on the following activities:  Development of treatment plan with patient or surrogate, discussions with consultants, evaluation of patient's response to treatment, examination of patient, ordering and review of laboratory studies, ordering and review of radiographic studies, ordering and performing treatments and interventions, pulse oximetry, re-evaluation of patient's condition and review of old charts   I assumed direction of critical care for this patient from another provider in my specialty: no      Care discussed with: admitting provider      Medications Ordered in the ED  sodium chloride  0.9 % bolus 500 mL (0 mLs Intravenous Stopped 05/21/24 2222)  sodium chloride  0.9 % bolus 1,000 mL (1,000 mLs Intravenous New Bag/Given 05/21/24 2242)                                    Medical Decision Making Amount and/or Complexity of Data Reviewed Labs: ordered.  Risk Decision regarding hospitalization.   58 year old female presents emergency department concern for thrombocytopenia.  She has also had decreased appetite and fatigue after being seen for esophageal spasm.  Ongoing diagnosis of PMR and GCA on high-dose prednisone .  Repeat blood work here confirms a thrombocytopenia of 23.  Remainder of the labs is reassuring.  Blood  pressures at 1 point were soft, improving with IV fluids.  She was also noted to be hyperglycemic most likely from high-dose chronic steroids.  Consulted with Dr. Lonn, hematology.  Given her diagnosis of GCA and PMR, suspect autoimmune process in regards to the thrombocytopenia.  Her recommendation would be higher dose oral steroids, taken in 1 dose versus the twice daily course that she is currently on.  She recommends an 80 mg of prednisone  to be taken in the morning with breakfast.  She recommends admission for repeat blood work and reevaluation in the morning by herself.  Patient agrees with this admission plan  Patients evaluation and results requires admission for further treatment and care.  Spoke with hospitalist, reviewed patient's ED course and they accept admission.  Patient agrees with admission plan, offers no new complaints and is stable/unchanged at time of admit.     Final diagnoses:  Thrombocytopenia [facility])    ED Discharge Orders     None          Bari Roxie HERO, DO 05/21/24 2321    Bari Roxie HERO, DO 05/21/24 2321

## 2024-05-22 ENCOUNTER — Emergency Department (HOSPITAL_COMMUNITY)

## 2024-05-22 DIAGNOSIS — E872 Acidosis, unspecified: Secondary | ICD-10-CM | POA: Diagnosis present

## 2024-05-22 DIAGNOSIS — Z91041 Radiographic dye allergy status: Secondary | ICD-10-CM | POA: Diagnosis not present

## 2024-05-22 DIAGNOSIS — B961 Klebsiella pneumoniae [K. pneumoniae] as the cause of diseases classified elsewhere: Secondary | ICD-10-CM | POA: Diagnosis not present

## 2024-05-22 DIAGNOSIS — M316 Other giant cell arteritis: Secondary | ICD-10-CM | POA: Diagnosis not present

## 2024-05-22 DIAGNOSIS — Z8349 Family history of other endocrine, nutritional and metabolic diseases: Secondary | ICD-10-CM | POA: Diagnosis not present

## 2024-05-22 DIAGNOSIS — D696 Thrombocytopenia, unspecified: Principal | ICD-10-CM | POA: Diagnosis present

## 2024-05-22 DIAGNOSIS — R001 Bradycardia, unspecified: Secondary | ICD-10-CM | POA: Diagnosis not present

## 2024-05-22 DIAGNOSIS — J189 Pneumonia, unspecified organism: Secondary | ICD-10-CM | POA: Diagnosis present

## 2024-05-22 DIAGNOSIS — R6521 Severe sepsis with septic shock: Secondary | ICD-10-CM | POA: Diagnosis present

## 2024-05-22 DIAGNOSIS — Z85820 Personal history of malignant melanoma of skin: Secondary | ICD-10-CM | POA: Diagnosis not present

## 2024-05-22 DIAGNOSIS — Z79899 Other long term (current) drug therapy: Secondary | ICD-10-CM | POA: Diagnosis not present

## 2024-05-22 DIAGNOSIS — M315 Giant cell arteritis with polymyalgia rheumatica: Secondary | ICD-10-CM | POA: Diagnosis present

## 2024-05-22 DIAGNOSIS — D849 Immunodeficiency, unspecified: Secondary | ICD-10-CM | POA: Diagnosis present

## 2024-05-22 DIAGNOSIS — Z888 Allergy status to other drugs, medicaments and biological substances status: Secondary | ICD-10-CM | POA: Diagnosis not present

## 2024-05-22 DIAGNOSIS — R7303 Prediabetes: Secondary | ICD-10-CM | POA: Diagnosis present

## 2024-05-22 DIAGNOSIS — R300 Dysuria: Secondary | ICD-10-CM | POA: Diagnosis not present

## 2024-05-22 DIAGNOSIS — I4589 Other specified conduction disorders: Secondary | ICD-10-CM | POA: Diagnosis present

## 2024-05-22 DIAGNOSIS — E8809 Other disorders of plasma-protein metabolism, not elsewhere classified: Secondary | ICD-10-CM | POA: Diagnosis present

## 2024-05-22 DIAGNOSIS — Z87891 Personal history of nicotine dependence: Secondary | ICD-10-CM | POA: Diagnosis not present

## 2024-05-22 DIAGNOSIS — A4159 Other Gram-negative sepsis: Secondary | ICD-10-CM | POA: Diagnosis present

## 2024-05-22 DIAGNOSIS — Z881 Allergy status to other antibiotic agents status: Secondary | ICD-10-CM | POA: Diagnosis not present

## 2024-05-22 DIAGNOSIS — M869 Osteomyelitis, unspecified: Secondary | ICD-10-CM | POA: Diagnosis present

## 2024-05-22 DIAGNOSIS — Z91018 Allergy to other foods: Secondary | ICD-10-CM | POA: Diagnosis not present

## 2024-05-22 DIAGNOSIS — A419 Sepsis, unspecified organism: Secondary | ICD-10-CM | POA: Diagnosis not present

## 2024-05-22 DIAGNOSIS — K750 Abscess of liver: Secondary | ICD-10-CM | POA: Diagnosis present

## 2024-05-22 DIAGNOSIS — Z91013 Allergy to seafood: Secondary | ICD-10-CM | POA: Diagnosis not present

## 2024-05-22 DIAGNOSIS — D649 Anemia, unspecified: Secondary | ICD-10-CM | POA: Diagnosis not present

## 2024-05-22 DIAGNOSIS — D693 Immune thrombocytopenic purpura: Secondary | ICD-10-CM | POA: Diagnosis present

## 2024-05-22 DIAGNOSIS — R102 Pelvic and perineal pain: Secondary | ICD-10-CM | POA: Diagnosis not present

## 2024-05-22 DIAGNOSIS — R7881 Bacteremia: Secondary | ICD-10-CM

## 2024-05-22 DIAGNOSIS — T380X5A Adverse effect of glucocorticoids and synthetic analogues, initial encounter: Secondary | ICD-10-CM | POA: Diagnosis present

## 2024-05-22 DIAGNOSIS — Z882 Allergy status to sulfonamides status: Secondary | ICD-10-CM | POA: Diagnosis not present

## 2024-05-22 DIAGNOSIS — D6959 Other secondary thrombocytopenia: Secondary | ICD-10-CM | POA: Diagnosis present

## 2024-05-22 LAB — CBC
HCT: 35.5 % — ABNORMAL LOW (ref 36.0–46.0)
Hemoglobin: 11.4 g/dL — ABNORMAL LOW (ref 12.0–15.0)
MCH: 28.2 pg (ref 26.0–34.0)
MCHC: 32.1 g/dL (ref 30.0–36.0)
MCV: 87.9 fL (ref 80.0–100.0)
Platelets: 21 K/uL — CL (ref 150–400)
RBC: 4.04 MIL/uL (ref 3.87–5.11)
RDW: 17.2 % — ABNORMAL HIGH (ref 11.5–15.5)
WBC: 7.8 K/uL (ref 4.0–10.5)
nRBC: 0 % (ref 0.0–0.2)

## 2024-05-22 LAB — VITAMIN B12: Vitamin B-12: 618 pg/mL (ref 180–914)

## 2024-05-22 LAB — COMPREHENSIVE METABOLIC PANEL WITH GFR
ALT: 24 U/L (ref 0–44)
AST: 11 U/L — ABNORMAL LOW (ref 15–41)
Albumin: 2.3 g/dL — ABNORMAL LOW (ref 3.5–5.0)
Alkaline Phosphatase: 144 U/L — ABNORMAL HIGH (ref 38–126)
Anion gap: 12 (ref 5–15)
BUN: 24 mg/dL — ABNORMAL HIGH (ref 6–20)
CO2: 22 mmol/L (ref 22–32)
Calcium: 8 mg/dL — ABNORMAL LOW (ref 8.9–10.3)
Chloride: 106 mmol/L (ref 98–111)
Creatinine, Ser: 0.54 mg/dL (ref 0.44–1.00)
GFR, Estimated: >60 mL/min (ref >60)
Glucose, Bld: 90 mg/dL (ref 70–99)
Potassium: 3.5 mmol/L (ref 3.5–5.1)
Sodium: 139 mmol/L (ref 135–145)
Total Bilirubin: 0.9 mg/dL (ref 0.0–1.2)
Total Protein: 4.2 g/dL — ABNORMAL LOW (ref 6.5–8.1)

## 2024-05-22 LAB — BLOOD CULTURE ID PANEL (REFLEXED) - BCID2

## 2024-05-22 LAB — IMMATURE PLATELET FRACTION: Immature Platelet Fraction: 9.3 % — ABNORMAL HIGH (ref 1.2–8.6)

## 2024-05-22 LAB — TECHNOLOGIST SMEAR REVIEW: Plt Morphology: NORMAL

## 2024-05-22 LAB — HEPATITIS PANEL, ACUTE
HCV Ab: NONREACTIVE
Hep A IgM: NONREACTIVE
Hep B C IgM: NONREACTIVE
Hepatitis B Surface Ag: NONREACTIVE

## 2024-05-22 LAB — RETICULOCYTES
Immature Retic Fract: 7.9 % (ref 2.3–15.9)
RBC.: 4.1 MIL/uL (ref 3.87–5.11)
Retic Count, Absolute: 18.4 K/uL — ABNORMAL LOW (ref 19.0–186.0)
Retic Ct Pct: 0.5 % (ref 0.4–3.1)

## 2024-05-22 LAB — LACTATE DEHYDROGENASE: LDH: 193 U/L — ABNORMAL HIGH (ref 98–192)

## 2024-05-22 LAB — HIV ANTIBODY (ROUTINE TESTING W REFLEX): HIV Screen 4th Generation wRfx: NONREACTIVE

## 2024-05-22 MED ORDER — ACETAMINOPHEN 325 MG PO TABS
650.0000 mg | ORAL_TABLET | Freq: Once | ORAL | Status: AC
  Administered 2024-05-22: 650 mg via ORAL
  Filled 2024-05-22: qty 2

## 2024-05-22 MED ORDER — ALBUTEROL SULFATE (2.5 MG/3ML) 0.083% IN NEBU: 2.5000 mg | INHALATION_SOLUTION | RESPIRATORY_TRACT | Status: DC | PRN

## 2024-05-22 MED ORDER — ONDANSETRON HCL 4 MG PO TABS: 4.0000 mg | ORAL_TABLET | Freq: Four times a day (QID) | ORAL | Status: DC | PRN

## 2024-05-22 MED ORDER — PREDNISONE 20 MG PO TABS
80.0000 mg | ORAL_TABLET | Freq: Every day | ORAL | Status: DC
  Administered 2024-05-22 – 2024-05-24 (×3): 80 mg via ORAL
  Filled 2024-05-22: qty 1
  Filled 2024-05-22 (×3): qty 4

## 2024-05-22 MED ORDER — LACTATED RINGERS IV SOLN: INTRAVENOUS | Status: DC

## 2024-05-22 MED ORDER — SENNOSIDES-DOCUSATE SODIUM 8.6-50 MG PO TABS
1.0000 | ORAL_TABLET | Freq: Two times a day (BID) | ORAL | Status: DC
  Administered 2024-05-22: 1 via ORAL
  Filled 2024-05-22 (×4): qty 1

## 2024-05-22 MED ORDER — ATOVAQUONE 750 MG/5ML PO SUSP
750.0000 mg | Freq: Two times a day (BID) | ORAL | Status: DC
  Administered 2024-05-22 – 2024-05-24 (×3): 750 mg via ORAL
  Filled 2024-05-22 (×6): qty 5

## 2024-05-22 MED ORDER — POLYETHYLENE GLYCOL 3350 17 G PO PACK: 17.0000 g | PACK | Freq: Every day | ORAL | Status: DC | PRN

## 2024-05-22 MED ORDER — DIPHENHYDRAMINE HCL 25 MG PO CAPS
25.0000 mg | ORAL_CAPSULE | ORAL | Status: AC
  Administered 2024-05-22 – 2024-05-23 (×2): 25 mg via ORAL
  Filled 2024-05-22 (×2): qty 1

## 2024-05-22 MED ORDER — METOPROLOL TARTRATE 25 MG PO TABS: 25.0000 mg | ORAL_TABLET | Freq: Every day | ORAL | Status: DC | PRN

## 2024-05-22 MED ORDER — FAMOTIDINE 20 MG PO TABS
40.0000 mg | ORAL_TABLET | Freq: Every day | ORAL | Status: DC
  Administered 2024-05-22 – 2024-05-30 (×9): 40 mg via ORAL
  Filled 2024-05-22 (×9): qty 2

## 2024-05-22 MED ORDER — ONDANSETRON HCL 4 MG/2ML IJ SOLN: 4.0000 mg | Freq: Four times a day (QID) | INTRAMUSCULAR | Status: DC | PRN

## 2024-05-22 MED ORDER — ACETAMINOPHEN 325 MG PO TABS
650.0000 mg | ORAL_TABLET | Freq: Four times a day (QID) | ORAL | Status: DC | PRN
  Administered 2024-05-22 – 2024-05-24 (×3): 650 mg via ORAL
  Filled 2024-05-22 (×3): qty 2

## 2024-05-22 MED ORDER — IMMUNE GLOBULIN (HUMAN) 10 GM/100ML IV SOLN
1.0000 g/kg | INTRAVENOUS | Status: AC
  Administered 2024-05-22 – 2024-05-23 (×2): 70 g via INTRAVENOUS
  Filled 2024-05-22 (×2): qty 700

## 2024-05-22 MED ORDER — SODIUM CHLORIDE 0.9 % IV SOLN
2.0000 g | INTRAVENOUS | Status: DC
  Filled 2024-05-22: qty 20

## 2024-05-22 MED ORDER — TRAZODONE HCL 50 MG PO TABS: 25.0000 mg | ORAL_TABLET | Freq: Every evening | ORAL | Status: DC | PRN

## 2024-05-22 NOTE — Hospital Course (Addendum)
  58 year old female past medical history of paroxysmal SVT, PAC, bicuspid aortic valve, thoracic aortic dilation, contrast allergy, and aortic aneurysm presented to the emergency department for evaluation for low platelet count.   Patient has history of giant cell arteritis on high-dose steroid outpatient.  She had outpatient blood work done that noted the changes and she was instructed to come in.  She admits to general decline over the past week or so after she was seen for esophageal spasm.  She has had decreased appetite.  She has had small amount of bleeding at the blood draw site but otherwise denies any nosebleeds, gum bleeding.  No recent fever or other acute symptoms.   Patient also taking atovaquone  unclear etiology.  At presentation to ED patient found hypotensive blood pressure was 58/38 which improved to 97/62. CBC showing low platelet count 23, normal WBC and H&H. CMP showed low bicarb 20, elevated BUN 29, elevated ALP 140.  Consulted with Dr. Lonn, hematology. Given her diagnosis of GCA and PMR, suspect autoimmune process in regards to the thrombocytopenia. Her recommendation would be higher dose oral steroids, taken in 1 dose versus the twice daily course that she is currently on. She recommends an 80 mg of prednisone  to be taken in the morning with breakfast. She recommends admission for repeat blood work and reevaluation in the morning by herself.  Hospitalist has been consulted for further evaluation management of thrombocytopenia.

## 2024-05-22 NOTE — H&P (Signed)
 History and Physical  Laurie Terry FMW:[id number] DOB: 12/06/1965 DOA: 05/21/2024  PCP: [name], MD   Chief Complaint: Low platelet count  HPI: Laurie Terry is a 58 y.o. female with medical history significant for esophageal spasm, aortic aneurysm, bicuspid aortic valve recently diagnosed with giant cell arteritis as well as PMR on high-dose steroid being admitted to the hospital with acute thrombocytopenia.  Patient states she was diagnosed with giant cell arteritis and PMR several months ago, saw a rheumatologist and has been on high-dose steroids.  Her PMR symptoms resolved very rapidly, however she has continued to have low-grade headaches and vision changes.  Over the last few days, she was having fever, severe lethargy, and relative hypotension.  She has no other focal symptoms, or symptoms of acute infection.  She denies any significant bruising or bleeding.  She went to her PCP with these complaints, and lab work was done which showed a platelet count of 30.  She was therefore sent to the ER for evaluation, where repeat CBC showed low platelets of 23,000.  She was admitted to the hospitalist service, and has already been seen by hematology this morning.  Review of Systems: Please see HPI for pertinent positives and negatives. A complete 10 system review of systems are otherwise negative.  Past Medical History:  Diagnosis Date   Allergic rhinitis    Aortic aneurysm (HCC) 09/23/2020   Cancer (HCC)    Melanoma   Cervical dysplasia 2007   low-grade SIL, normal Pap smears since then   Family history of adverse reaction to anesthesia    mom has h/o post op nausea/vomiting   Intermittent palpitations    IUD    inserted 03/2002.04/06/2007, 06/03/2012   Ocular migraine    Osteomalacia    Pancreatitis    Seafood allergy    Past Surgical History:  Procedure Laterality Date   BIOPSY  01/21/2022   Procedure: BIOPSY;  Surgeon: Dianna Specking, MD;  Location: [facility];   Service: Gastroenterology;;   ROMAYNE  2018   CHOLECYSTECTOMY N/A 04/09/2016   Procedure: LAPAROSCOPIC CHOLECYSTECTOMY ;  Surgeon: Donnice Bury, MD;  Location: [facility] OR;  Service: General;  Laterality: N/A;   ERCP  04/11/2016   ERCP with pancreatic stent and failed CBD stent after needle knife and conventional sphincterotomy   ERCP N/A 04/12/2016   Procedure: ENDOSCOPIC RETROGRADE CHOLANGIOPANCREATOGRAPHY (ERCP);  Surgeon: Oliva Boots, MD;  Location: [facility] ENDOSCOPY;  Service: Endoscopy;  Laterality: N/A;   ERCP N/A 01/15/2022   Procedure: ENDOSCOPIC RETROGRADE CHOLANGIOPANCREATOGRAPHY (ERCP);  Surgeon: Boots Oliva, MD;  Location: [facility];  Service: Gastroenterology;  Laterality: N/A;   FLEXIBLE SIGMOIDOSCOPY N/A 01/21/2022   Procedure: FLEXIBLE SIGMOIDOSCOPY;  Surgeon: Dianna Specking, MD;  Location: [facility];  Service: Gastroenterology;  Laterality: N/A;   FOOT SURGERY     toenail   INTRAUTERINE DEVICE INSERTION  08/07/2017   Mirena    IR EXCHANGE BILIARY DRAIN  02/11/2022   IR RADIOLOGIST EVAL & MGMT  02/08/2022   IR RADIOLOGIST EVAL & MGMT  02/20/2022   IR RADIOLOGIST EVAL & MGMT  02/28/2022   removal of melanoma     REMOVAL OF STONES  01/15/2022   Procedure: REMOVAL OF STONES;  Surgeon: Boots Oliva, MD;  Location: [facility];  Service: Gastroenterology;;   ANNETT  01/15/2022   Procedure: ANNETT;  Surgeon: Boots Oliva, MD;  Location: [facility];  Service: Gastroenterology;;   Social History:  reports that she has quit smoking. She has been exposed to tobacco smoke.  She has never used smokeless tobacco. She reports current alcohol use. She reports that she does not use drugs.  Allergies  Allergen Reactions   Contrast Media [Iodinated Contrast Media] Shortness Of Breath    Low blood pressure    Iodine Shortness Of Breath and Swelling   Other Hives and Swelling    Seafood   Augmentin  [Amoxicillin -Pot Clavulanate] Nausea And Vomiting   Avocado Itching     Tongue and mouth   Levaquin [Levofloxacin]     Not recommended in Aortic aneurysm patients.   Levofloxacin Hives    Pt already has aortic aneurysm.   Pantoprazole  Other (See Comments)    Joint aches, fever, peeling of skin   Vancomycin  Cough    Patient develops cough, rash ears, tingling lips after receiving Cefepime  and vancomycin  01/23/2022   Colestipol  Hcl Rash    Family History  Problem Relation Age of Onset   Tremor Mother    Stroke Father    Autoimmune disease Father    CVA Father    Breast cancer Sister 60       3X   Arthritis Sister        Psoriatic   Thyroid  disease Sister    Breast cancer Maternal Grandmother        Age 17   Thyroid  disease Daughter        Hypo   Uterine cancer Neg Hx    Bladder Cancer Neg Hx    Renal cancer Neg Hx      Prior to Admission medications   Medication Sig Start Date End Date Taking? Authorizing Provider  acetaminophen  (TYLENOL ) 500 MG tablet Take 500 mg by mouth every 6 (six) hours as needed.   Yes [provider]  Adapalene 0.3 % gel Apply 1 application  topically daily as needed. 04/29/22  Yes [provider]  alum & mag hydroxide-simeth (MAALOX PLUS) 400-400-40 MG/5ML suspension Take 15 mLs by mouth every 6 (six) hours as needed for indigestion. 05/07/24  Yes [name], MD  atovaquone  (MEPRON ) 750 MG/5ML suspension Take 750 mg by mouth 2 (two) times daily.   Yes [provider]  dicyclomine  (BENTYL ) 20 MG tablet Take 1 tablet (20 mg total) by mouth 2 (two) times daily. Patient taking differently: Take 20 mg by mouth 2 (two) times daily as needed. 05/07/24  Yes [name], MD  EPINEPHrine  0.3 mg/0.3 mL IJ SOAJ injection Inject 0.3 mg into the muscle as needed for anaphylaxis. 10/08/22  Yes Tobie Arleta SQUIBB, MD  famotidine  (PEPCID ) 20 MG tablet Take 20 mg by mouth daily as needed for heartburn or indigestion.   Yes [provider]  fluticasone  (FLONASE ) 50 MCG/ACT nasal spray Place 2 sprays  into both nostrils daily. Patient taking differently: Place 2 sprays into both nostrils daily as needed. 10/08/22  Yes Tobie Arleta SQUIBB, MD  metoprolol  tartrate (LOPRESSOR ) 25 MG tablet Take 1 tablet (25 mg total) by mouth daily as needed (palpitations). 01/27/24  Yes Chandrasekhar, Mahesh A, MD  predniSONE  (DELTASONE ) 20 MG tablet Take 30 mg by mouth 2 (two) times daily.   Yes [provider]  estradiol  (ESTRACE ) 0.1 MG/GM vaginal cream Place 0.5 g vaginally 2 (two) times a week. Place 0.5g nightly for two weeks then twice a week after Patient not taking: Reported on 05/21/2024 01/08/24   Zuleta, Kaitlin G, NP  nitrofurantoin , macrocrystal-monohydrate, (MACROBID ) 100 MG capsule Take 1 capsule (100 mg total) by mouth 2 (two) times daily. Patient not taking: Reported  on 04/06/2024 02/26/24   Zuleta, Kaitlin G, NP  oxyCODONE -acetaminophen  (PERCOCET/ROXICET) 5-325 MG tablet Take 1 tablet by mouth every 6 (six) hours as needed for severe pain (pain score 7-10). Patient not taking: Reported on 05/21/2024 05/07/24   [name], MD    Physical Exam: BP 90/64   Pulse (!) 102   Temp 98.3 F (36.8 C) (Oral)   Resp 16   SpO2 95%  General:  Alert, oriented, calm, in no acute distress  Eyes: EOMI, clear conjuctivae, white sclerea Neck: supple, no masses, trachea mildline  Cardiovascular: RRR, no murmurs or rubs, no peripheral edema  Respiratory: clear to auscultation bilaterally, no wheezes, no crackles  Abdomen: soft, nontender, nondistended, normal bowel tones heard  Skin: dry, no rashes  Musculoskeletal: no joint effusions, normal range of motion  Psychiatric: appropriate affect, normal speech  Neurologic: extraocular muscles intact, clear speech, moving all extremities with intact sensorium         Labs on Admission:  Basic Metabolic Panel: Recent Labs  Lab 05/21/24 1800 05/22/24 0700  NA 135 139  K 4.0 3.5  CL 100 106  CO2 20* 22  GLUCOSE 319* 90  BUN 27* 24*  CREATININE 0.71  0.54  CALCIUM 8.1* 8.0*   Liver Function Tests: Recent Labs  Lab 05/21/24 1800 05/22/24 0700  AST 16 11*  ALT 30 24  ALKPHOS 140* 144*  BILITOT 0.9 0.9  PROT 4.9* 4.2*  ALBUMIN 2.6* 2.3*   No results for input(s): LIPASE, AMYLASE in the last 168 hours. No results for input(s): AMMONIA in the last 168 hours. CBC: Recent Labs  Lab 05/21/24 1800 05/22/24 0700  WBC 10.4 7.8  HGB 12.7 11.4*  HCT 39.4 35.5*  MCV 88.5 87.9  PLT 23* 21*   Cardiac Enzymes: No results for input(s): CKTOTAL, CKMB, CKMBINDEX, TROPONINI in the last 168 hours. BNP (last 3 results) No results for input(s): BNP in the last 8760 hours.  ProBNP (last 3 results) No results for input(s): PROBNP in the last 8760 hours.  CBG: No results for input(s): GLUCAP in the last 168 hours.  Radiological Exams on Admission: DG CHEST PORT 1 VIEW Result Date: 05/22/2024 EXAM: 1 VIEW XRAY OF THE CHEST 05/22/2024 07:44:00 AM COMPARISON: 05/07/2024 Stable left lung base linear opacities compatible with atelectasis/scar. CLINICAL HISTORY: Fever [phone]. Per chart: Patient c/o weakness x 1 week. Patient was seen by PCP today and recommended to be seen in ED for low platelet count. Patient report nausea, denies vomiting. Patient denies Chest pain and SOB. FINDINGS: LUNGS AND PLEURA: No focal pulmonary opacity. No pulmonary edema. No pleural effusion. No pneumothorax. HEART AND MEDIASTINUM: No acute abnormality of the cardiac and mediastinal silhouettes. BONES AND SOFT TISSUES: No acute osseous abnormality. IMPRESSION: 1. No acute findings. 2. Stable left lung base linear opacities compatible with atelectasis/scar. Electronically signed by: Waddell Calk MD 05/22/2024 07:59 AM EDT RP Workstation: [id number]   Assessment/Plan Laurie Terry is a 58 y.o. female with medical history significant for esophageal spasm, aortic aneurysm, bicuspid aortic valve recently diagnosed with giant cell arteritis as well as  PMR on high-dose steroid being admitted to the hospital with acute thrombocytopenia.   Thrombocytopenia-found on abnormal blood draw done by her PCP recently due to low-grade fever, severe fatigue.  She was recently diagnosed with giant cell arteritis and has been on high-dose steroid. -Inpatient admission -Has already been evaluated by Dr. Lonn -Per her recommendation, will initiate prednisone  80 mg p.o. daily, as well as IVIG -  Currently no indication for platelet transfusion, but would transfuse for signs of active bleeding or platelet count less than 10,000  Recurrent fever and hypotension-unclear etiology, will follow-up on blood cultures, urinalysis and chest x-ray without evidence of infection  Hyperglycemia-initial blood sugar 319, likely due to high-dose steroid -Monitor on regular diet for the time being  DVT prophylaxis: SCDs only    Code Status: Full Code  Consults called: Hematology  Admission status: The appropriate patient status for this patient is INPATIENT. Inpatient status is judged to be reasonable and necessary in order to provide the required intensity of service to ensure the patient's safety. The patient's presenting symptoms, physical exam findings, and initial radiographic and laboratory data in the context of their chronic comorbidities is felt to place them at high risk for further clinical deterioration. Furthermore, it is not anticipated that the patient will be medically stable for discharge from the hospital within 2 midnights of admission.    I certify that at the point of admission it is my clinical judgment that the patient will require inpatient hospital care spanning beyond 2 midnights from the point of admission due to high intensity of service, high risk for further deterioration and high frequency of surveillance required  Time spent: 56 minutes  Wynelle Dreier CHRISTELLA Gail MD [facility] Pager [phone]  If 7PM-7AM, please contact  night-coverage [email or web address] Password [email or web address]  05/22/2024, 9:35 AM

## 2024-05-22 NOTE — ED Notes (Signed)
 Breakfast tray given.

## 2024-05-22 NOTE — Consult Note (Signed)
 [name] CONSULT NOTE  Patient Care Team: Laurie Harvey, MD as PCP - General (Family Medicine) Laurie Cough, MD as Consulting Physician (General Surgery)  ASSESSMENT & PLAN Acute thrombocytopenia She developed acute thrombocytopenia in the setting of recent treatment with prednisone  for giant cell arteritis, prescribed by her rheumatologist.  I reviewed all her labs extensively.  She has positive anti-CCP antibody and strong family history of rheumatoid arthritis in her father She has developed some fever and chills at home, raising the possibility that acute thrombocytopenia could be related to possible immune mediated thrombocytopenia/destruction from undiagnosed infection I will order additional labs Surprisingly, she has minimal bruising with her low platelet count  She could be considered steroid refractory, but to give her the benefit of the doubt, I recommend raising her prednisone  to 80 mg daily for few days We discussed risk and benefits of IVIG infusion at 1 g Per kilogram for 2 days She agreed to proceed I will hold off platelet transfusion unless she have signs of active bleeding or platelet count less than 10,000 I will order CBC daily to trend If her platelet count does not improve by Monday, can consider adding Nplate   Giant cell arteritis/autoimmune disorder She was started on steroids/prednisone  by her rheumatologist We will continue for few days although I am not sure if she does have giant cell arteritis as it is usually associated with thrombocytosis and her symptoms have not fully improved with high-dose steroids so far  Mild dysuria I suspect this could be related to atrophic vaginitis Urinalysis is so far unremarkable She will continue topical vaginal estrogen cream  Recurrent fever I will order blood culture and chest x-ray  Elevated blood sugar Likely steroid-induced We discussed dietary modification while on high-dose  steroids  Discharge planning I informed the ER physician that she will likely benefit from admission over the next few days until her platelet count stabilized The risk of not admitting her could be risk of fatal bleeding The patient is aware that I will not be rounding this weekend I have informed covering physician, [name] to check on her I anticipate she will be here through the weekend   All questions were answered. The patient knows to call the clinic with any problems, questions or concerns. No barriers to learning was detected. The total time spent in the appointment was 80 minutes encounter with patients including review of chart and various tests results, discussions about plan of care and coordination of care plan  Laurie Bedford, MD 05/22/2024 7:29 AM  CHIEF COMPLAINTS/PURPOSE OF CONSULTATION:  Acute thrombocytopenia  HISTORY OF PRESENTING ILLNESS:  Laurie Terry 58 y.o. female is here because of thrombocytopenia.  She was found to have abnormal CBC from blood draw with her primary care doctor The patient has background history of melanoma and recent diagnosis of giant cell arteritis I have reviewed her records extensively She was prescribed prednisone  for almost a month ago by her rheumatologist after presentation with right temporal pain and visual changes.  She never had a biopsy performed. She had multiple rheumatology labs done, surprisingly, her inflammatory markers were not elevated but she has positive anti-CCP antibodies She was placed on 30 mg of prednisone  twice daily Initially, the prednisone  improved her joint pain but her visual changes persist This past week, she started to have recurrent fever, as high as 101.5 associated with profound fatigue She was recommended to follow-up with her primary care doctor who ordered a CBC yesterday and was  found to have low platelet count of 30 and she was directed to the emergency department Yesterday evening, repeat  CBC came back low at 23  On August 15, she went to the emergency department due to epigastric discomfort, could be related to steroids.  She had CT imaging of the chest, abdomen and pelvis which showed no abnormalities She denies recent bruising/bleeding, such as spontaneous epistaxis, hematuria, melena or hematochezia.  She does have some bruises after blood draw. The patient denies history of liver disease, exposure to heparin, history of cardiac murmur/prior cardiovascular surgery  She denies prior blood or platelet transfusions   MEDICAL HISTORY:  Past Medical History:  Diagnosis Date   Allergic rhinitis    Aortic aneurysm (HCC) 09/23/2020   Cancer (HCC)    Melanoma   Cervical dysplasia 2007   low-grade SIL, normal Pap smears since then   Family history of adverse reaction to anesthesia    mom has h/o post op nausea/vomiting   Intermittent palpitations    IUD    inserted 03/2002.04/06/2007, 06/03/2012   Ocular migraine    Osteomalacia    Pancreatitis    Seafood allergy     SURGICAL HISTORY: Past Surgical History:  Procedure Laterality Date   BIOPSY  01/21/2022   Procedure: BIOPSY;  Surgeon: Laurie Specking, MD;  Location: [facility];  Service: Gastroenterology;;   ROMAYNE  2018   CHOLECYSTECTOMY N/A 04/09/2016   Procedure: LAPAROSCOPIC CHOLECYSTECTOMY ;  Surgeon: Laurie Bury, MD;  Location: [facility] OR;  Service: General;  Laterality: N/A;   ERCP  04/11/2016   ERCP with pancreatic stent and failed CBD stent after needle knife and conventional sphincterotomy   ERCP N/A 04/12/2016   Procedure: ENDOSCOPIC RETROGRADE CHOLANGIOPANCREATOGRAPHY (ERCP);  Surgeon: Laurie Boots, MD;  Location: [facility] ENDOSCOPY;  Service: Endoscopy;  Laterality: N/A;   ERCP N/A 01/15/2022   Procedure: ENDOSCOPIC RETROGRADE CHOLANGIOPANCREATOGRAPHY (ERCP);  Surgeon: Terry Oliva, MD;  Location: [facility];  Service: Gastroenterology;  Laterality: N/A;   FLEXIBLE SIGMOIDOSCOPY N/A 01/21/2022   Procedure:  FLEXIBLE SIGMOIDOSCOPY;  Surgeon: Laurie Specking, MD;  Location: [facility];  Service: Gastroenterology;  Laterality: N/A;   FOOT SURGERY     toenail   INTRAUTERINE DEVICE INSERTION  08/07/2017   Mirena    IR EXCHANGE BILIARY DRAIN  02/11/2022   IR RADIOLOGIST EVAL & MGMT  02/08/2022   IR RADIOLOGIST EVAL & MGMT  02/20/2022   IR RADIOLOGIST EVAL & MGMT  02/28/2022   removal of melanoma     REMOVAL OF STONES  01/15/2022   Procedure: REMOVAL OF STONES;  Surgeon: Terry Oliva, MD;  Location: [facility];  Service: Gastroenterology;;   ANNETT  01/15/2022   Procedure: ANNETT;  Surgeon: Terry Oliva, MD;  Location: [facility];  Service: Gastroenterology;;    SOCIAL HISTORY: Social History   Socioeconomic History   Marital status: Married    Spouse name: Marcey Blumenthal   Number of children: 4   Years of education: Not on file   Highest education level: Not on file  Occupational History   Not on file  Tobacco Use   Smoking status: Former    Passive exposure: Past   Smokeless tobacco: Never  Vaping Use   Vaping status: Never Used  Substance and Sexual Activity   Alcohol use: Yes   Drug use: No   Sexual activity: Yes    Partners: Male    Birth control/protection: Post-menopausal    Comment: des neg  Other Topics Concern   Not on file  Social History Narrative   Not on file   Social Drivers of Health   Financial Resource Strain: Not on file  Food Insecurity: Low Risk  (05/22/2023)   Received from [facility]   Hunger Vital Sign    Within the past 12 months, you worried that your food would run out before you got money to buy more: Never true    Within the past 12 months, the food you bought just didn't last and you didn't have money to get more. : Never true  Transportation Needs: No Transportation Needs (05/22/2023)   Received from [employer]    In the past 12 months, has lack of reliable transportation kept you from medical appointments,  meetings, work or from getting things needed for daily living? : No  Physical Activity: Not on file  Stress: Not on file  Social Connections: Not on file  Intimate Partner Violence: Not on file    FAMILY HISTORY: Family History  Problem Relation Age of Onset   Tremor Mother    Stroke Father    Autoimmune disease Father    CVA Father    Breast cancer Sister 53       3X   Arthritis Sister        Psoriatic   Thyroid  disease Sister    Breast cancer Maternal Grandmother        Age 58   Thyroid  disease Daughter        Hypo   Uterine cancer Neg Hx    Bladder Cancer Neg Hx    Renal cancer Neg Hx     ALLERGIES:  is allergic to contrast media [iodinated contrast media], iodine, other, augmentin  [amoxicillin -pot clavulanate], avocado, levaquin [levofloxacin], levofloxacin, pantoprazole , vancomycin , and colestipol  hcl.  MEDICATIONS:  Current Facility-Administered Medications  Medication Dose Route Frequency Provider Last Rate Last Admin   acetaminophen  (TYLENOL ) tablet 650 mg  650 mg Oral Q6H PRN Sundil, Subrina, MD       Immune Globulin  10% (PRIVIGEN ) IV infusion 1 g/kg  1 g/kg Intravenous Q24 Hr x 2 Lonn, Lanai Conlee, MD       lactated ringers  infusion   Intravenous Continuous Sundil, Subrina, MD       predniSONE  (DELTASONE ) tablet 80 mg  80 mg Oral Q breakfast Lonn Hicks, MD       Current Outpatient Medications  Medication Sig Dispense Refill   acetaminophen  (TYLENOL ) 500 MG tablet Take 500 mg by mouth every 6 (six) hours as needed.     Adapalene 0.3 % gel Apply 1 application  topically daily as needed.     alum & mag hydroxide-simeth (MAALOX PLUS) 400-400-40 MG/5ML suspension Take 15 mLs by mouth every 6 (six) hours as needed for indigestion. 355 mL 0   atovaquone  (MEPRON ) 750 MG/5ML suspension Take 750 mg by mouth 2 (two) times daily.     dicyclomine  (BENTYL ) 20 MG tablet Take 1 tablet (20 mg total) by mouth 2 (two) times daily. (Patient taking differently: Take 20 mg by mouth 2 (two)  times daily as needed.) 20 tablet 0   EPINEPHrine  0.3 mg/0.3 mL IJ SOAJ injection Inject 0.3 mg into the muscle as needed for anaphylaxis. 2 each 1   famotidine  (PEPCID ) 20 MG tablet Take 20 mg by mouth daily as needed for heartburn or indigestion.     fluticasone  (FLONASE ) 50 MCG/ACT nasal spray Place 2 sprays into both nostrils daily. (Patient taking differently: Place 2 sprays into both nostrils daily as needed.) 16 g  5   metoprolol  tartrate (LOPRESSOR ) 25 MG tablet Take 1 tablet (25 mg total) by mouth daily as needed (palpitations). 30 tablet 3   predniSONE  (DELTASONE ) 20 MG tablet Take 30 mg by mouth 2 (two) times daily.     estradiol  (ESTRACE ) 0.1 MG/GM vaginal cream Place 0.5 g vaginally 2 (two) times a week. Place 0.5g nightly for two weeks then twice a week after (Patient not taking: Reported on 05/21/2024) 42.5 g 11   nitrofurantoin , macrocrystal-monohydrate, (MACROBID ) 100 MG capsule Take 1 capsule (100 mg total) by mouth 2 (two) times daily. (Patient not taking: Reported on 04/06/2024) 10 capsule 0   oxyCODONE -acetaminophen  (PERCOCET/ROXICET) 5-325 MG tablet Take 1 tablet by mouth every 6 (six) hours as needed for severe pain (pain score 7-10). (Patient not taking: Reported on 05/21/2024) 6 tablet 0    REVIEW OF SYSTEMS:   All other systems were reviewed with the patient and are negative.  PHYSICAL EXAMINATION: ECOG PERFORMANCE STATUS: 1 - Symptomatic but completely ambulatory  Vitals:   05/22/24 0700 05/22/24 0715  BP: (!) 96/59   Pulse: 99   Resp: 16   Temp:  98.3 F (36.8 C)  SpO2: 96%    There were no vitals filed for this visit.  GENERAL:alert, no distress and comfortable SKIN: skin color, texture, turgor are normal, no rashes or significant lesions. Minimal bruising EYES: normal, conjunctiva are pink and non-injected, sclera clear OROPHARYNX:no exudate, no erythema and lips, buccal mucosa, and tongue normal  NECK: supple, thyroid  normal size, non-tender, without  nodularity LYMPH:  no palpable lymphadenopathy in the cervical, axillary or inguinal LUNGS: Some mild bibasilar crackles HEART: regular rate & rhythm and no murmurs and no lower extremity edema ABDOMEN:abdomen soft, non-tender and normal bowel sounds Musculoskeletal:no cyanosis of digits and no clubbing  PSYCH: alert & oriented x 3 with fluent speech NEURO: no focal motor/sensory deficits  LABORATORY DATA:  I have reviewed the data as listed Lab Results  Component Value Date   WBC 10.4 05/21/2024   HGB 12.7 05/21/2024   HCT 39.4 05/21/2024   MCV 88.5 05/21/2024   PLT 23 (LL) 05/21/2024     RADIOGRAPHIC STUDIES: I have personally reviewed the radiological images as listed and agreed with the findings in the report. CT CHEST ABDOMEN PELVIS WO CONTRAST Result Date: 05/07/2024 CLINICAL DATA:  Epigastric pain, chest pain EXAM: CT CHEST, ABDOMEN AND PELVIS WITHOUT CONTRAST TECHNIQUE: Multidetector CT imaging of the chest, abdomen and pelvis was performed following the standard protocol without IV contrast. RADIATION DOSE REDUCTION: This exam was performed according to the departmental dose-optimization program which includes automated exposure control, adjustment of the mA and/or kV according to patient size and/or use of iterative reconstruction technique. COMPARISON:  None Available. FINDINGS: CT CHEST FINDINGS Cardiovascular: Heart is normal size. Aorta is normal caliber. Mediastinum/Nodes: No mediastinal, hilar, or axillary adenopathy. Trachea and esophagus are unremarkable. Thyroid  unremarkable. Lungs/Pleura: Linear scarring in the lung bases. No confluent opacities or effusions. Musculoskeletal: Chest wall soft tissues are unremarkable. No acute bony abnormality. CT ABDOMEN PELVIS FINDINGS Hepatobiliary: Pneumobilia again noted. No focal hepatic abnormality. Prior cholecystectomy. Pancreas: No focal abnormality or ductal dilatation. Spleen: No focal abnormality.  Normal size. Adrenals/Urinary  Tract: No adrenal abnormality. No focal renal abnormality. No stones or hydronephrosis. Urinary bladder is unremarkable. Stomach/Bowel: Normal appendix. Stomach, large and small bowel grossly unremarkable. Moderate stool burden. Vascular/Lymphatic: No evidence of aneurysm or adenopathy. Reproductive: Uterus and adnexa unremarkable.  No mass. Other: No free fluid or free air.  Musculoskeletal: No acute bony abnormality. IMPRESSION: No acute findings in the chest, abdomen or pelvis. Bibasilar scarring. Moderate stool burden throughout the colon. Electronically Signed   By: Franky Crease M.D.   On: 05/07/2024 19:14   DG Chest 2 View Result Date: 05/07/2024 CLINICAL DATA:  Chest pain. EXAM: CHEST - 2 VIEW COMPARISON:  03/04/2022. FINDINGS: The heart size and mediastinal contours are within normal limits. Minimal linear atelectasis/scarring at the left lung base. No focal consolidation, pleural effusion, or pneumothorax. No acute osseous abnormality. IMPRESSION: Minimal linear atelectasis/scarring at the left lung base. Otherwise, no acute cardiopulmonary findings. Electronically Signed   By: Harrietta Sherry M.D.   On: 05/07/2024 13:27   MM 3D SCREENING MAMMOGRAM BILATERAL BREAST Result Date: 05/03/2024 CLINICAL DATA:  Screening. EXAM: DIGITAL SCREENING BILATERAL MAMMOGRAM WITH TOMOSYNTHESIS AND CAD TECHNIQUE: Bilateral screening digital craniocaudal and mediolateral oblique mammograms were obtained. Bilateral screening digital breast tomosynthesis was performed. The images were evaluated with computer-aided detection. COMPARISON:  Previous exam(s). ACR Breast Density Category b: There are scattered areas of fibroglandular density. FINDINGS: There are no findings suspicious for malignancy. IMPRESSION: No mammographic evidence of malignancy. A result letter of this screening mammogram will be mailed directly to the patient. RECOMMENDATION: Screening mammogram in one year. (Code:SM-B-01Y) BI-RADS CATEGORY  1:  Negative. Electronically Signed   By: Toribio Agreste M.D.   On: 05/03/2024 17:10

## 2024-05-22 NOTE — ED Notes (Signed)
 Patient has refused meds until she can get something to eat before taking them   she was advised that they were due now and that we didn't know when breakfast trays would be coming down

## 2024-05-22 NOTE — Progress Notes (Signed)
 PHARMACY - PHYSICIAN COMMUNICATION CRITICAL VALUE ALERT - BLOOD CULTURE IDENTIFICATION (BCID)  Laurie Terry is an 58 y.o. female who presented to [facility] on 05/21/2024 with acute thrombocytopenia  Assessment:  BCID + Klebsiella pneumoniae (no ESBL) in 2/2 bottles   Name of physician (or Provider) Contacted: Lynwood Kipper, FNP  Current antibiotics: Atovaquone   Changes to prescribed antibiotics recommended:  Begin Ceftriaxone  2g IV q24h  Results for orders placed or performed during the hospital encounter of 05/21/24  Blood Culture ID Panel (Reflexed) (Collected: 05/22/2024  7:51 AM)  Result Value Ref Range   Enterococcus faecalis NOT DETECTED NOT DETECTED   Enterococcus Faecium NOT DETECTED NOT DETECTED   Listeria monocytogenes NOT DETECTED NOT DETECTED   Staphylococcus species NOT DETECTED NOT DETECTED   Staphylococcus aureus (BCID) NOT DETECTED NOT DETECTED   Staphylococcus epidermidis NOT DETECTED NOT DETECTED   Staphylococcus lugdunensis NOT DETECTED NOT DETECTED   Streptococcus species NOT DETECTED NOT DETECTED   Streptococcus agalactiae NOT DETECTED NOT DETECTED   Streptococcus pneumoniae NOT DETECTED NOT DETECTED   Streptococcus pyogenes NOT DETECTED NOT DETECTED   A.calcoaceticus-baumannii NOT DETECTED NOT DETECTED   Bacteroides fragilis NOT DETECTED NOT DETECTED   Enterobacterales DETECTED (A) NOT DETECTED   Enterobacter cloacae complex NOT DETECTED NOT DETECTED   Escherichia coli NOT DETECTED NOT DETECTED   Klebsiella aerogenes NOT DETECTED NOT DETECTED   Klebsiella oxytoca NOT DETECTED NOT DETECTED   Klebsiella pneumoniae DETECTED (A) NOT DETECTED   Proteus species NOT DETECTED NOT DETECTED   Salmonella species NOT DETECTED NOT DETECTED   Serratia marcescens NOT DETECTED NOT DETECTED   Haemophilus influenzae NOT DETECTED NOT DETECTED   Neisseria meningitidis NOT DETECTED NOT DETECTED   Pseudomonas aeruginosa NOT DETECTED NOT DETECTED   Stenotrophomonas  maltophilia NOT DETECTED NOT DETECTED   Candida albicans NOT DETECTED NOT DETECTED   Candida auris NOT DETECTED NOT DETECTED   Candida glabrata NOT DETECTED NOT DETECTED   Candida krusei NOT DETECTED NOT DETECTED   Candida parapsilosis NOT DETECTED NOT DETECTED   Candida tropicalis NOT DETECTED NOT DETECTED   Cryptococcus neoformans/gattii NOT DETECTED NOT DETECTED   CTX-M ESBL NOT DETECTED NOT DETECTED   Carbapenem resistance IMP NOT DETECTED NOT DETECTED   Carbapenem resistance KPC NOT DETECTED NOT DETECTED   Carbapenem resistance NDM NOT DETECTED NOT DETECTED   Carbapenem resist OXA 48 LIKE NOT DETECTED NOT DETECTED   Carbapenem resistance VIM NOT DETECTED NOT DETECTED    Arvin Gauss, PharmD 05/22/2024  10:36 PM

## 2024-05-22 NOTE — ED Notes (Signed)
 Have spoke to pharmacy    they stated they would hold infusion until she has a room on the floor

## 2024-05-22 NOTE — ED Notes (Signed)
 Patient states she feels as if she has a fever and has chills/shakes.  Temperature normal.  Patient also has a headache and was given Tylenol .

## 2024-05-22 NOTE — Plan of Care (Signed)

## 2024-05-23 ENCOUNTER — Inpatient Hospital Stay (HOSPITAL_COMMUNITY)

## 2024-05-23 DIAGNOSIS — D696 Thrombocytopenia, unspecified: Secondary | ICD-10-CM | POA: Diagnosis not present

## 2024-05-23 LAB — CBC WITH DIFFERENTIAL/PLATELET
Abs Immature Granulocytes: 0.84 K/uL — ABNORMAL HIGH (ref 0.00–0.07)
Basophils Absolute: 0.1 K/uL (ref 0.0–0.1)
Basophils Relative: 1 %
Eosinophils Absolute: 0 K/uL (ref 0.0–0.5)
Eosinophils Relative: 0 %
HCT: 35.4 % — ABNORMAL LOW (ref 36.0–46.0)
Hemoglobin: 11.3 g/dL — ABNORMAL LOW (ref 12.0–15.0)
Immature Granulocytes: 11 %
Lymphocytes Relative: 5 %
Lymphs Abs: 0.4 K/uL — ABNORMAL LOW (ref 0.7–4.0)
MCH: 27.8 pg (ref 26.0–34.0)
MCHC: 31.9 g/dL (ref 30.0–36.0)
MCV: 87.2 fL (ref 80.0–100.0)
Monocytes Absolute: 0.3 K/uL (ref 0.1–1.0)
Monocytes Relative: 4 %
Neutro Abs: 6 K/uL (ref 1.7–7.7)
Neutrophils Relative %: 79 %
Platelets: 20 K/uL — CL (ref 150–400)
RBC: 4.06 MIL/uL (ref 3.87–5.11)
RDW: 17.5 % — ABNORMAL HIGH (ref 11.5–15.5)
WBC: 7.6 K/uL (ref 4.0–10.5)
nRBC: 0.3 % — ABNORMAL HIGH (ref 0.0–0.2)

## 2024-05-23 LAB — BASIC METABOLIC PANEL WITH GFR
Anion gap: 12 (ref 5–15)
BUN: 24 mg/dL — ABNORMAL HIGH (ref 6–20)
CO2: 21 mmol/L — ABNORMAL LOW (ref 22–32)
Calcium: 8 mg/dL — ABNORMAL LOW (ref 8.9–10.3)
Chloride: 104 mmol/L (ref 98–111)
Creatinine, Ser: 0.77 mg/dL (ref 0.44–1.00)
GFR, Estimated: >60 mL/min (ref >60)
Glucose, Bld: 88 mg/dL (ref 70–99)
Potassium: 3.6 mmol/L (ref 3.5–5.1)
Sodium: 137 mmol/L (ref 135–145)

## 2024-05-23 LAB — LACTIC ACID, PLASMA
Lactic Acid, Venous: 2.8 mmol/L (ref 0.5–1.9)
Lactic Acid, Venous: 2.3 mmol/L (ref 0.5–1.9)

## 2024-05-23 LAB — HAPTOGLOBIN: Haptoglobin: 387 mg/dL — ABNORMAL HIGH (ref 33–346)

## 2024-05-23 MED ORDER — SODIUM CHLORIDE 0.9 % IV SOLN
2.0000 g | INTRAVENOUS | Status: AC
  Administered 2024-05-23: 2 g via INTRAVENOUS
  Filled 2024-05-23: qty 20

## 2024-05-23 MED ORDER — BARIUM SULFATE 2 % PO SUSP
450.0000 mL | ORAL | Status: AC
  Administered 2024-05-23 (×2): 450 mL via ORAL

## 2024-05-23 MED ORDER — SODIUM CHLORIDE 0.9 % IV BOLUS
1000.0000 mL | Freq: Once | INTRAVENOUS | Status: AC
  Administered 2024-05-23: 1000 mL via INTRAVENOUS

## 2024-05-23 MED ORDER — MIDODRINE HCL 5 MG PO TABS
10.0000 mg | ORAL_TABLET | Freq: Three times a day (TID) | ORAL | Status: DC
  Administered 2024-05-23 – 2024-05-24 (×4): 10 mg via ORAL
  Filled 2024-05-23 (×4): qty 2

## 2024-05-23 MED ORDER — SODIUM CHLORIDE 0.9 % IV SOLN: INTRAVENOUS | Status: AC

## 2024-05-23 MED ORDER — SODIUM CHLORIDE 0.9 % IV SOLN
2.0000 g | Freq: Every day | INTRAVENOUS | Status: DC
  Administered 2024-05-24 – 2024-05-30 (×7): 2 g via INTRAVENOUS
  Filled 2024-05-23 (×7): qty 20

## 2024-05-23 NOTE — Progress Notes (Signed)
 Hematology/Oncology Progress Note  Clinical Summary: [name] is a 58 year old female with history of giant cell arteritis/autoimmune disorder who presents with severe thrombocytopenia, likely secondary to ITP.   Interval History: -- Patient received initial dose of IVIG yesterday, second dose today. -- Continues on prednisone  80 mg p.o. daily -- Blood culture data returned concerning for Enterobacterales and Klebsiella.  Empiric antibiotic therapy started. -- Patient is seen today with her husband at bedside. -- She is not having any bleeding, bruising, or dark stools.  She also denies any further fevers, chills, sweats. -- Reports mild abdominal pain but no nausea, vomiting, or diarrhea. -- Tolerated first round of IVIG well, understands the risks and benefits of additional rounds of treatment.   O:  Vitals:   05/23/24 1001 05/23/24 1110  BP: (!) 81/54 91/63  Pulse:  76  Resp:    Temp:    SpO2:  96%      Latest Ref Rng & Units 05/23/2024    5:13 AM 05/22/2024    7:00 AM 05/21/2024    6:00 PM  CMP  Glucose 70 - 99 mg/dL 88  90  680   BUN 6 - 20 mg/dL 24  24  27    Creatinine 0.44 - 1.00 mg/dL 9.22  9.45  9.28   Sodium 135 - 145 mmol/L 137  139  135   Potassium 3.5 - 5.1 mmol/L 3.6  3.5  4.0   Chloride 98 - 111 mmol/L 104  106  100   CO2 22 - 32 mmol/L 21  22  20    Calcium 8.9 - 10.3 mg/dL 8.0  8.0  8.1   Total Protein 6.5 - 8.1 g/dL  4.2  4.9   Total Bilirubin 0.0 - 1.2 mg/dL  0.9  0.9   Alkaline Phos 38 - 126 U/L  144  140   AST 15 - 41 U/L  11  16   ALT 0 - 44 U/L  24  30       Latest Ref Rng & Units 05/23/2024    5:13 AM 05/22/2024    7:00 AM 05/21/2024    6:00 PM  CBC  WBC 4.0 - 10.5 K/uL 7.6  7.8  10.4   Hemoglobin 12.0 - 15.0 g/dL 88.6  88.5  87.2   Hematocrit 36.0 - 46.0 % 35.4  35.5  39.4   Platelets 150 - 400 K/uL 20  21  23        GENERAL: well appearing young Caucasian female in NAD  SKIN: skin color, texture, turgor are normal, no rashes or  significant lesions EYES: conjunctiva are pink and non-injected, sclera clear LUNGS: clear to auscultation and percussion with normal breathing effort HEART: regular rate & rhythm and no murmurs and no lower extremity edema Musculoskeletal: no cyanosis of digits and no clubbing  PSYCH: alert & oriented x 3, fluent speech NEURO: no focal motor/sensory deficits  Assessment/Plan:  # Severe Thrombocytopenia -- Patient is currently on prednisone  80 mg p.o. daily in addition to IVIG therapy.  Patient is receiving second and final dose of IVIG today. -- Recommend holding on platelet transfusion unless patient is having active bleeding or platelets less than 10,000 -- No evidence of hemolysis with normal bilirubin, reticulocytes.  Haptoglobin pending with mildly elevated LDH -- Labs today show white blood cell 7.6, hemoglobin 1.3, MCV 87.2, platelets of 20 -- Will plan to start Nplate  therapy 1 mcg subcutaneous tomorrow if platelets have not shown significant improvement -- Hematology  service will continue to follow  # Giant cell arteritis/autoimmune disorder  -- Continue prednisone  80 mg p.o. daily as noted above.  # Bacteremia -- Agree with empiric antibiotics per primary team.   Norleen IVAR Kidney, MD Department of Hematology/Oncology [facility] at [facility] Phone: [phone] Pager: [phone] Email: [email or web address]

## 2024-05-23 NOTE — Progress Notes (Signed)
 PROGRESS NOTE Laurie Terry  FMW:[id number] DOB: 05-26-1966 DOA: 05/21/2024 PCP: [name], MD  Brief Narrative/Hospital Course: 28 yof w/ history significant for esophageal spasm, aortic aneurysm, bicuspid aortic valve recently diagnosed with giant cell arteritis as well as PMR on high-dose steroid being admitted to the hospital with acute thrombocytopenia.  Recently diagnosed with GCA and PMR a month ago, saw a rheumatologist and has been on high-dose steroids.Her PMR symptoms resolved very rapidly, however she has continued to have low-grade headaches and vision changes.Over the last few days, she was having fever, severe lethargy, and relative hypotension w/ focal symptoms, or symptoms of acute infection, no bruising or bleeding. She went to her PCP for evaluation and labs showed with platelet count 30K and referred to the ED  In ED: BP soft 80s-90s, afebrile heart rate 70s-100, not hypoxic,CBC showed low platelets of 23,000, hematology was consulted and admitted for further management.  Subjective: Seen and examined today am Feels unwell worse compared to yesterday BP remains soft 80s-90s recheck in 70s,mildly symptomatic. afebrile, BCID positive overnight with Klebsiella. Labs platelet about the same at 20K hemoglobin stable.  Assessment and plan:  Acute Thrombocytopenia Klebsiella bacteremia  Recently diagnosed GCA/PMR/autoimmune disorder-PTA on high-dose steroid: Found to have abnormal labs and routine PCP workup for low-grade fever fatigue-in the setting of high-dose steroid for recently diagnosed  GCA/PMR/autoimmune disorder. Oncology Dr. Lonn following closely, her presentation could be related to possibly immune mediated thrombocytopenia/?destruction from undiagnosed infection-getting additional lab. Although thrombocytopenia no evidence of bleeding or bruising Suspecting steroid resistant, treating with 80mg  of prednisone  for few days to see response. started IVIG x  2 days on admit. Holding off on platelet transfusion unless she has active bleeding or platelet counts less than 10K if platelet does not improve by Monday can consider adding Nplate  Overnight BCID Panel 8/30 + Klebsiella bacteremia-ceftriaxone  has been ordered but patient was hesitant for new medis She was seen back in June at [facility] allergy-regarding adverse effects of other drugs, colestipol  was felt to be an allergy  Patient has received ceftriaxone  in May 2023 She is on Atovaquone  PTA. UA shows LE large WBC 20-50, check urine culture  Chest x-ray 8/30 no evidence of infection. Retics 0.5 ( less than 3) ldh 193.discussed w/ Dr Lonn and Dr Federico this am. I discussed with Dr. Lindia from infectious disease-agrees for antibiotics to clear her bacteremia and for sepsis, recommending CT chest abdomen pelvis without contrast once BP stable  Repeat BP improving to 90s lactic acid at 2.8, additional 1 L bolus NS ordered and repeat lactic ordered Transfer patient to progressive unit  Severe Sepsis POA due to bacteremia Relative hypotension: Suspect sepsis as bcid + w/ klebsiella C/W ID. Will do bolus NS, check lacttate. Cont iv antibiotis as above Continue IVF   Hyperglycemia: Blood sugar stable this morning initially 319, check A1c could be from steroid  Mild metabolic acidosis: Bicarbonate 20 on.  Monitor  DVT prophylaxis: SCDs Start: 05/22/24 9078 Code Status:   Code Status: Full Code Family Communication: plan of care discussed with patient at bedside. Patient status is: Remains hospitalized because of severity of illness Level of care: Med-Surg   Dispo: The patient is from: home            Anticipated disposition: TBD Objective: Vitals last 24 hrs: Vitals:   05/22/24 2004 05/23/24 0045 05/23/24 0739 05/23/24 0847  BP: 96/67 (!) 91/58 (!) 82/50 (!) 75/54  Pulse: 74 75 97 92  Resp: 18 16 16  Temp: 98.8 F (37.1 C) 100 F (37.8 C) 99.3 F (37.4 C)   TempSrc:   Oral   SpO2:  98% 99% 91%   Weight:      Height:        Physical Examination: General exam: alert awake, mildly ill appearing HEENT:Oral mucosa moist, Ear/Nose WNL grossly Respiratory system: Bilaterally clear BS,no use of accessory muscle Cardiovascular system: S1 & S2 +, No JVD. Gastrointestinal system: Abdomen soft,NT,ND, BS+ Nervous System: Alert, awake, moving all extremities,and following commands. Extremities: LE edema neg, distal extremities warm.  Skin: No rashes,no icterus. MSK: Normal muscle bulk,tone, power.   Medications reviewed:  Scheduled Meds:  atovaquone   750 mg Oral BID   diphenhydrAMINE   25 mg Oral Q24 Hr x 2   famotidine   40 mg Oral Daily   predniSONE   80 mg Oral Q breakfast   senna-docusate  1 tablet Oral BID   Continuous Infusions:  sodium chloride      cefTRIAXone  (ROCEPHIN )  IV 2 g (05/23/24 0935)   [START ON 05/24/2024] cefTRIAXone  (ROCEPHIN )  IV     Immune Globulin  10% Stopped (05/22/24 2006)   Diet: Diet Order             Diet vegetarian Room service appropriate? Yes; Fluid consistency: Thin  Diet effective now                   Data Reviewed: I have personally reviewed following labs and imaging studies ( see epic result tab) CBC: Recent Labs  Lab 05/21/24 1800 05/22/24 0700 05/23/24 0513  WBC 10.4 7.8 7.6  NEUTROABS  --   --  6.0  HGB 12.7 11.4* 11.3*  HCT 39.4 35.5* 35.4*  MCV 88.5 87.9 87.2  PLT 23* 21* 20*   CMP: Recent Labs  Lab 05/21/24 1800 05/22/24 0700 05/23/24 0513  NA 135 139 137  K 4.0 3.5 3.6  CL 100 106 104  CO2 20* 22 21*  GLUCOSE 319* 90 88  BUN 27* 24* 24*  CREATININE 0.71 0.54 0.77  CALCIUM 8.1* 8.0* 8.0*   GFR: Estimated Creatinine Clearance: 73.9 mL/min (by C-G formula based on SCr of 0.77 mg/dL). Recent Labs  Lab 05/21/24 1800 05/22/24 0700  AST 16 11*  ALT 30 24  ALKPHOS 140* 144*  BILITOT 0.9 0.9  PROT 4.9* 4.2*  ALBUMIN 2.6* 2.3*   No results for input(s): LIPASE, AMYLASE in the last 168 hours. No  results for input(s): AMMONIA in the last 168 hours. Coagulation Profile: No results for input(s): INR, PROTIME in the last 168 hours. Unresulted Labs (From admission, onward)     Start     Ordered   05/24/24 0500  Hemoglobin A1c  Tomorrow morning,   R        05/23/24 0945   05/23/24 0922  Lactic acid, plasma  (Lactic Acid)  STAT Now then every 3 hours,   R      05/23/24 0921   05/23/24 0837  Urinalysis, w/ Reflex to Culture (Infection Suspected) -Urine, Clean Catch  (Urine Labs)  Add-on,   AD       Question:  Specimen Source  Answer:  Urine, Clean Catch   05/23/24 0836   05/23/24 0500  CBC with Differential/Platelet  Daily,   R      05/22/24 0717   05/23/24 0500  CBC  Tomorrow morning,   R        05/22/24 0921   05/22/24 1019  Haptoglobin  Once,  R        05/22/24 1020           Antimicrobials/Microbiology: Anti-infectives (From admission, onward)    Start     Dose/Rate Route Frequency Ordered Stop   05/24/24 1000  cefTRIAXone  (ROCEPHIN ) 2 g in sodium chloride  0.9 % 100 mL IVPB        2 g 200 mL/hr over 30 Minutes Intravenous Daily 05/23/24 0831     05/23/24 0930  cefTRIAXone  (ROCEPHIN ) 2 g in sodium chloride  0.9 % 100 mL IVPB        2 g 200 mL/hr over 30 Minutes Intravenous STAT 05/23/24 0830 05/23/24 1005   05/22/24 2330  cefTRIAXone  (ROCEPHIN ) 2 g in sodium chloride  0.9 % 100 mL IVPB  Status:  Discontinued        2 g 200 mL/hr over 30 Minutes Intravenous Every 24 hours 05/22/24 2235 05/23/24 0831   05/22/24 1430  atovaquone  (MEPRON ) 750 MG/5ML suspension 750 mg        750 mg Oral 2 times daily 05/22/24 1330           Component Value Date/Time   SDES  05/22/2024 0751    BLOOD LEFT ANTECUBITAL Performed at [facility], [address], [place], [place] [address]    SPECREQUEST  05/22/2024 [id number]    BOTTLES DRAWN AEROBIC AND ANAEROBIC Blood Culture adequate volume Performed at [facility], [address], [place],  [place] [address]    CULT (A) 05/22/2024 0751    KLEBSIELLA PNEUMONIAE SUSCEPTIBILITIES TO FOLLOW Performed at [facility], [address], [place], [place] [address]    REPTSTATUS PENDING 05/22/2024 9248    Procedures:    Mennie LAMY, MD [facility] 05/23/2024, 12:05 PM

## 2024-05-23 NOTE — Plan of Care (Signed)

## 2024-05-24 DIAGNOSIS — D649 Anemia, unspecified: Secondary | ICD-10-CM | POA: Insufficient documentation

## 2024-05-24 DIAGNOSIS — A419 Sepsis, unspecified organism: Secondary | ICD-10-CM

## 2024-05-24 DIAGNOSIS — R7881 Bacteremia: Secondary | ICD-10-CM

## 2024-05-24 DIAGNOSIS — D696 Thrombocytopenia, unspecified: Secondary | ICD-10-CM | POA: Diagnosis not present

## 2024-05-24 DIAGNOSIS — B965 Pseudomonas (aeruginosa) (mallei) (pseudomallei) as the cause of diseases classified elsewhere: Secondary | ICD-10-CM

## 2024-05-24 LAB — CULTURE, BLOOD (SINGLE): Special Requests: ADEQUATE

## 2024-05-24 LAB — TSH: TSH: 0.311 u[IU]/mL — ABNORMAL LOW (ref 0.350–4.500)

## 2024-05-24 LAB — CBC WITH DIFFERENTIAL/PLATELET
Abs Immature Granulocytes: 0.27 K/uL — ABNORMAL HIGH (ref 0.00–0.07)
Basophils Absolute: 0 K/uL (ref 0.0–0.1)
Basophils Relative: 0 %
Eosinophils Absolute: 0 K/uL (ref 0.0–0.5)
Eosinophils Relative: 0 %
HCT: 30.1 % — ABNORMAL LOW (ref 36.0–46.0)
Hemoglobin: 9.7 g/dL — ABNORMAL LOW (ref 12.0–15.0)
Immature Granulocytes: 3 %
Lymphocytes Relative: 9 %
Lymphs Abs: 0.8 K/uL (ref 0.7–4.0)
MCH: 29 pg (ref 26.0–34.0)
MCHC: 32.2 g/dL (ref 30.0–36.0)
MCV: 90.1 fL (ref 80.0–100.0)
Monocytes Absolute: 0.4 K/uL (ref 0.1–1.0)
Monocytes Relative: 4 %
Neutro Abs: 7.4 K/uL (ref 1.7–7.7)
Neutrophils Relative %: 84 %
Platelets: 18 K/uL — CL (ref 150–400)
RBC: 3.34 MIL/uL — ABNORMAL LOW (ref 3.87–5.11)
RDW: 18.3 % — ABNORMAL HIGH (ref 11.5–15.5)
WBC: 8.9 K/uL (ref 4.0–10.5)
nRBC: 0.2 % (ref 0.0–0.2)

## 2024-05-24 LAB — T4, FREE: Free T4: 0.57 ng/dL — ABNORMAL LOW (ref 0.61–1.12)

## 2024-05-24 LAB — HEMOGLOBIN A1C
Hgb A1c MFr Bld: 6.4 % — ABNORMAL HIGH (ref 4.8–5.6)
Mean Plasma Glucose: 136.98 mg/dL

## 2024-05-24 LAB — MRSA NEXT GEN BY PCR, NASAL: MRSA by PCR Next Gen: NOT DETECTED

## 2024-05-24 LAB — LACTIC ACID, PLASMA: Lactic Acid, Venous: 3.5 mmol/L (ref 0.5–1.9)

## 2024-05-24 MED ORDER — METOCLOPRAMIDE HCL 5 MG/ML IJ SOLN: 10.0000 mg | Freq: Four times a day (QID) | INTRAMUSCULAR | Status: DC | PRN

## 2024-05-24 MED ORDER — ROMIPLOSTIM 125 MCG ~~LOC~~ SOLR
1.0000 ug/kg | Freq: Once | SUBCUTANEOUS | Status: AC
  Administered 2024-05-24: 70 ug via SUBCUTANEOUS
  Filled 2024-05-24: qty 0.14

## 2024-05-24 MED ORDER — OXYCODONE HCL 5 MG PO TABS
5.0000 mg | ORAL_TABLET | ORAL | Status: DC | PRN
  Filled 2024-05-24: qty 1

## 2024-05-24 MED ORDER — CHLORHEXIDINE GLUCONATE CLOTH 2 % EX PADS
6.0000 | MEDICATED_PAD | Freq: Every day | CUTANEOUS | Status: DC
  Administered 2024-05-24: 6 via TOPICAL

## 2024-05-24 MED ORDER — ATOVAQUONE 750 MG/5ML PO SUSP
750.0000 mg | Freq: Two times a day (BID) | ORAL | Status: DC
Start: 2024-05-24 — End: 2024-05-30
  Administered 2024-05-24 – 2024-05-30 (×11): 750 mg via ORAL
  Filled 2024-05-24 (×14): qty 5

## 2024-05-24 MED ORDER — DIPHENHYDRAMINE HCL 50 MG/ML IJ SOLN: 25.0000 mg | Freq: Four times a day (QID) | INTRAMUSCULAR | Status: DC | PRN

## 2024-05-24 MED ORDER — ORAL CARE MOUTH RINSE
15.0000 mL | OROMUCOSAL | Status: DC | PRN
Start: 2024-05-24 — End: 2024-05-30

## 2024-05-24 MED ORDER — LOPERAMIDE HCL 2 MG PO CAPS
2.0000 mg | ORAL_CAPSULE | ORAL | Status: DC | PRN
  Administered 2024-05-24 – 2024-05-29 (×9): 2 mg via ORAL
  Filled 2024-05-24 (×10): qty 1

## 2024-05-24 MED ORDER — ACETAMINOPHEN 325 MG PO TABS
650.0000 mg | ORAL_TABLET | ORAL | Status: DC | PRN
  Administered 2024-05-24 – 2024-05-26 (×3): 650 mg via ORAL
  Filled 2024-05-24 (×3): qty 2

## 2024-05-24 MED ORDER — LACTATED RINGERS IV BOLUS
500.0000 mL | Freq: Once | INTRAVENOUS | Status: AC
  Administered 2024-05-24: 500 mL via INTRAVENOUS

## 2024-05-24 NOTE — Progress Notes (Signed)
 Pt is co headache 5/10.reports she has been having this pain for 6 weeks,but today its more intense.Tylenol  given.

## 2024-05-24 NOTE — Progress Notes (Signed)
   05/24/24 0853  TOC Brief Assessment  Insurance and Status Reviewed  Patient has primary care physician Yes  Home environment has been reviewed Resides in single family home with spouse  Prior level of function: Independent with ADLs at baseline  Prior/Current Home Services No current home services  Social Drivers of Health Review SDOH reviewed no interventions necessary  Readmission risk has been reviewed Yes  Transition of care needs no transition of care needs at this time

## 2024-05-24 NOTE — Progress Notes (Signed)
 Progress Note    Laurie Terry   FMW:[id number]  DOB: 1966/04/17  DOA: 05/21/2024     2 PCP: [name], MD  Initial CC: lethargy  Hospital Course: 57 yof w/ history significant for esophageal spasm, aortic aneurysm, bicuspid aortic valve recently diagnosed recently with giant cell arteritis as well as PMR on high-dose steroid being admitted to the hospital with acute thrombocytopenia.   Recently diagnosed with GCA and PMR a month ago, saw a rheumatologist and has been on high-dose steroids. Her PMR symptoms (joint pains) resolved very rapidly, however she has continued to have low-grade headaches and vision changes. Over the last few days, she was having fever, severe lethargy, and relative hypotension with no focal symptoms, or symptoms of acute infection, no bruising or bleeding. She went to her PCP for evaluation and labs showed with platelet count 30K and referred to the ED   In ED: BP soft 80s-90s, afebrile heart rate 70s-100, not hypoxic,CBC showed low platelets of 23,000, hematology was consulted and admitted for further management.  Assessment and plan:  Acute Thrombocytopenia Recent clinical diagnosis GCA Recent diagnosis PMR - Found to have abnormal labs and routine PCP workup for low-grade fever fatigue-in the setting of high-dose steroid for recently diagnosed GCA/PMR/autoimmune disorder. - GCA diagnosis appears to be a clinical diagnosis outpatient.  No temporal artery biopsy performed - Obtaining temporal artery vascular ultrasound; likely no utility in ordering inflammatory markers given underlying infection - with further collateral info: known history of ocular migraines with aura's and recent headaches have been similar with aura (but slightly different) however she never had decreased vision or vision loss. She has had photosensitivity with this prolonged episode and symptoms since ~June (?prolonged complicated migraine? Instead of diagnosis of GCA especially  without biopsy or other modalities. She does state inflammatory marker was elevated at rheumatology evaluation and with presentation she was then diagnosed with GCA) - Need to ensure outpatient follow-up with rheumatology (Dr. Jon Jacob) - Seen by hematology initially and started on 2 days of IVIG - Still no improvement in platelets as of 9/1 and started on Nplate   Klebsiella bacteremia  Severe sepsis-resolved - No obvious urinary symptoms although does report some very mild dysuria prior to admission and even some suprapubic discomfort; on exam she also has right CVA tenderness.  CT A/P also showed bilateral perinephric fat stranding concerning for pyelonephritis.  Outpatient last Friday she had urine culture performed which showed 50-100 K mixed urogenital flora.  Most likely source seems to be urinary at this time.  She did have some GI symptoms also prior to admission but those did not seem significant - Sensitivities reviewed, continue Rocephin  for now - Given complicated infection with underlying immunocompromised state, will pursue ID consult for antibiotic length along with how long to remain on IV  Normocytic anemia - No obvious bleeding but with thrombocytopenia, hemoglobin has downtrended some since admission - Hgb 9.7 g/dL this morning - If further downtrend tomorrow, may need to consider platelet and/for blood transfusion  Prediabetes - A1c 6.4%.  Hyperglycemia likely in setting of steroid use - Controlled for now, but if up trends further will need to start on sliding scale  Mild metabolic acidosis -Stable in the low 20s  Interval History:  No events overnight. Still with some photophobia (brightness on phone dimmed) and still with intermittent headache without much improvement with tylenol .    Old records reviewed in assessment of this patient  Antimicrobials: Rocephin  05/23/2024 >> current  DVT  prophylaxis:  Place and maintain sequential compression device Start:  05/23/24 0944 SCDs Start: 05/22/24 0921   Code Status:   Code Status: Full Code  Mobility Assessment (Last 72 Hours)     Mobility Assessment     Row Name 05/24/24 0840 05/23/24 2300 05/23/24 0937 05/22/24 20:04:30 05/22/24 1300   Does the patient have exclusion criteria? No - Perform mobility assessment No - Perform mobility assessment No - Perform mobility assessment No - Perform mobility assessment No - Perform mobility assessment   What is the highest level of mobility based on the mobility assessment? Level 5 (Ambulates independently) - Balance while walking independently - Complete Level 5 (Ambulates independently) - Balance while walking independently - Complete Level 5 (Ambulates independently) - Balance while walking independently - Complete Level 5 (Ambulates independently) - Balance while walking independently - Complete Level 5 (Ambulates independently) - Balance while walking independently - Complete      Barriers to discharge:  Disposition Plan: Home HH orders placed: N/A Status is: Inpatient  Objective: Blood pressure 98/64, pulse 60, temperature 98.2 F (36.8 C), temperature source Oral, resp. rate 18, height 5' 5 (1.651 m), weight 68.9 kg, SpO2 96%.  Examination:  Physical Exam Constitutional:      Appearance: Normal appearance.     Comments: Subjective complaint of headache mostly right-sided  HENT:     Head: Normocephalic and atraumatic.     Mouth/Throat:     Mouth: Mucous membranes are moist.  Eyes:     Extraocular Movements: Extraocular movements intact.  Cardiovascular:     Rate and Rhythm: Normal rate and regular rhythm.  Pulmonary:     Effort: Pulmonary effort is normal. No respiratory distress.     Breath sounds: Normal breath sounds. No wheezing.  Abdominal:     General: Bowel sounds are normal. There is no distension.     Palpations: Abdomen is soft.     Tenderness: There is no abdominal tenderness.  Musculoskeletal:        General: Normal range  of motion.     Cervical back: Normal range of motion and neck supple.  Skin:    General: Skin is warm and dry.  Neurological:     General: No focal deficit present.     Mental Status: She is alert.     Comments: Strength and sensation intact  Psychiatric:        Mood and Affect: Mood normal.      Consultants:  Hematology  Procedures:    Data Reviewed: Results for orders placed or performed during the hospital encounter of 05/21/24 (from the past 24 hours)  CBC with Differential/Platelet     Status: Abnormal   Collection Time: 05/24/24  5:05 AM  Result Value Ref Range   WBC 8.9 4.0 - 10.5 K/uL   RBC 3.34 (L) 3.87 - 5.11 MIL/uL   Hemoglobin 9.7 (L) 12.0 - 15.0 g/dL   HCT 69.8 (L) 63.9 - 53.9 %   MCV 90.1 80.0 - 100.0 fL   MCH 29.0 26.0 - 34.0 pg   MCHC 32.2 30.0 - 36.0 g/dL   RDW 81.6 (H) 88.4 - 84.4 %   Platelets 18 (LL) 150 - 400 K/uL   nRBC 0.2 0.0 - 0.2 %   Neutrophils Relative % 84 %   Neutro Abs 7.4 1.7 - 7.7 K/uL   Lymphocytes Relative 9 %   Lymphs Abs 0.8 0.7 - 4.0 K/uL   Monocytes Relative 4 %   Monocytes Absolute 0.4 0.1 -  1.0 K/uL   Eosinophils Relative 0 %   Eosinophils Absolute 0.0 0.0 - 0.5 K/uL   Basophils Relative 0 %   Basophils Absolute 0.0 0.0 - 0.1 K/uL   Immature Granulocytes 3 %   Abs Immature Granulocytes 0.27 (H) 0.00 - 0.07 K/uL   Agglutination PRESENT   Hemoglobin A1c     Status: Abnormal   Collection Time: 05/24/24  5:05 AM  Result Value Ref Range   Hgb A1c MFr Bld 6.4 (H) 4.8 - 5.6 %   Mean Plasma Glucose 136.98 mg/dL    I have reviewed pertinent nursing notes, vitals, labs, and images as necessary. I have ordered labwork to follow up on as indicated.  I have reviewed the last notes from staff over past 24 hours. I have discussed patient's care plan and test results with nursing staff, CM/SW, and other staff as appropriate.  Time spent: Greater than 50% of the 55 minute visit was spent in counseling/coordination of care for the  patient as laid out in the A&P.   LOS: 2 days   Alm Apo, MD [facility] 05/24/2024, 1:07 PM

## 2024-05-24 NOTE — Plan of Care (Signed)

## 2024-05-24 NOTE — Progress Notes (Signed)
 Hematology/Oncology Progress Note  Clinical Summary: [name]. Laurie Terry is a 58 year old female with history of giant cell arteritis/autoimmune disorder who presents with severe thrombocytopenia, likely secondary to ITP.   Interval History: -- Patient received 2nd dose of IVIG yesterday -- Continues on prednisone  80 mg p.o. daily -- Blood culture data returned concerning for Enterobacterales and Klebsiella.  Empiric antibiotic therapy started. -- Patient is seen today with her husband at bedside. -- She is not having any bleeding or dark stools.  Does have bruising at sites of phlebotomy lab draws --She also denies any further fevers, chills, sweats. -- Reports mild abdominal pain but no nausea, vomiting.  Does endorse having some diarrhea.  Reports it is light in color with no maroon or black. --Patient reports otherwise she is feeling quite well. -- first dose of NPLATE  administered today.    O:  Vitals:   05/24/24 0512 05/24/24 1319  BP: 98/64 103/68  Pulse: 60 89  Resp: 18   Temp: 98.2 F (36.8 C) 97.9 F (36.6 C)  SpO2: 96% 99%      Latest Ref Rng & Units 05/23/2024    5:13 AM 05/22/2024    7:00 AM 05/21/2024    6:00 PM  CMP  Glucose 70 - 99 mg/dL 88  90  680   BUN 6 - 20 mg/dL 24  24  27    Creatinine 0.44 - 1.00 mg/dL 9.22  9.45  9.28   Sodium 135 - 145 mmol/L 137  139  135   Potassium 3.5 - 5.1 mmol/L 3.6  3.5  4.0   Chloride 98 - 111 mmol/L 104  106  100   CO2 22 - 32 mmol/L 21  22  20    Calcium 8.9 - 10.3 mg/dL 8.0  8.0  8.1   Total Protein 6.5 - 8.1 g/dL  4.2  4.9   Total Bilirubin 0.0 - 1.2 mg/dL  0.9  0.9   Alkaline Phos 38 - 126 U/L  144  140   AST 15 - 41 U/L  11  16   ALT 0 - 44 U/L  24  30       Latest Ref Rng & Units 05/24/2024    5:05 AM 05/23/2024    5:13 AM 05/22/2024    7:00 AM  CBC  WBC 4.0 - 10.5 K/uL 8.9  7.6  7.8   Hemoglobin 12.0 - 15.0 g/dL 9.7  88.6  88.5   Hematocrit 36.0 - 46.0 % 30.1  35.4  35.5   Platelets 150 - 400 K/uL 18  20  21         GENERAL: well appearing young Caucasian female in NAD  SKIN: skin color, texture, turgor are normal, no rashes or significant lesions EYES: conjunctiva are pink and non-injected, sclera clear LUNGS: clear to auscultation and percussion with normal breathing effort HEART: regular rate & rhythm and no murmurs and no lower extremity edema Musculoskeletal: no cyanosis of digits and no clubbing  PSYCH: alert & oriented x 3, fluent speech NEURO: no focal motor/sensory deficits  Assessment/Plan:  # Severe Thrombocytopenia -- Patient is currently on prednisone  80 mg p.o. daily in addition to IVIG therapy.  Patient is received second and final dose of IVIG yesterday -- Recommend holding on platelet transfusion unless patient is having active bleeding or platelets less than 10,000 -- No evidence of hemolysis with normal bilirubin, reticulocytes.  Haptoglobin pending with mildly elevated LDH --Peripheral blood smear showed near absent platelets with no evidence  of schistocytes or other concerning abnormalities. -- Labs today show white blood cell 8.9, hemoglobin 9.7, MCV 90.1, platelets 18 --started Nplate  therapy 1 mcg today as platelets have not shown significant improvement.  Patient tolerated shot well with no difficulties. -- Hematology service will continue to follow  # Giant cell arteritis/autoimmune disorder  -- Continue prednisone  80 mg p.o. daily as noted above.  # Bacteremia -- Agree with empiric antibiotics per primary team.   Norleen IVAR Kidney, MD Department of Hematology/Oncology [facility] Phone: [phone] Pager: [phone] Email: [email or web address]

## 2024-05-25 ENCOUNTER — Inpatient Hospital Stay (HOSPITAL_COMMUNITY)

## 2024-05-25 ENCOUNTER — Encounter: Admitting: Physical Therapy

## 2024-05-25 ENCOUNTER — Encounter (HOSPITAL_COMMUNITY)

## 2024-05-25 DIAGNOSIS — M315 Giant cell arteritis with polymyalgia rheumatica: Secondary | ICD-10-CM

## 2024-05-25 DIAGNOSIS — R7881 Bacteremia: Secondary | ICD-10-CM | POA: Diagnosis not present

## 2024-05-25 DIAGNOSIS — R300 Dysuria: Secondary | ICD-10-CM | POA: Diagnosis not present

## 2024-05-25 DIAGNOSIS — R6521 Severe sepsis with septic shock: Secondary | ICD-10-CM

## 2024-05-25 DIAGNOSIS — A419 Sepsis, unspecified organism: Secondary | ICD-10-CM

## 2024-05-25 DIAGNOSIS — D649 Anemia, unspecified: Secondary | ICD-10-CM | POA: Diagnosis not present

## 2024-05-25 DIAGNOSIS — R001 Bradycardia, unspecified: Secondary | ICD-10-CM | POA: Diagnosis not present

## 2024-05-25 DIAGNOSIS — B961 Klebsiella pneumoniae [K. pneumoniae] as the cause of diseases classified elsewhere: Secondary | ICD-10-CM

## 2024-05-25 DIAGNOSIS — D696 Thrombocytopenia, unspecified: Secondary | ICD-10-CM | POA: Diagnosis not present

## 2024-05-25 LAB — CBC WITH DIFFERENTIAL/PLATELET
Basophils Absolute: 0 K/uL (ref 0.0–0.1)
Basophils Relative: 0 %
Eosinophils Absolute: 0 K/uL (ref 0.0–0.5)
Eosinophils Relative: 0 %
HCT: 32 % — ABNORMAL LOW (ref 36.0–46.0)
Hemoglobin: 10 g/dL — ABNORMAL LOW (ref 12.0–15.0)
Lymphocytes Relative: 2 %
Lymphs Abs: 0.3 K/uL — ABNORMAL LOW (ref 0.7–4.0)
MCH: 27.9 pg (ref 26.0–34.0)
MCHC: 31.3 g/dL (ref 30.0–36.0)
MCV: 89.1 fL (ref 80.0–100.0)
Monocytes Absolute: 0.1 K/uL (ref 0.1–1.0)
Monocytes Relative: 1 %
Neutro Abs: 12.8 K/uL — ABNORMAL HIGH (ref 1.7–7.7)
Neutrophils Relative %: 97 %
Platelets: 37 K/uL — ABNORMAL LOW (ref 150–400)
RBC: 3.59 MIL/uL — ABNORMAL LOW (ref 3.87–5.11)
RDW: 18.3 % — ABNORMAL HIGH (ref 11.5–15.5)
WBC: 13.2 K/uL — ABNORMAL HIGH (ref 4.0–10.5)
nRBC: 0.2 % (ref 0.0–0.2)

## 2024-05-25 LAB — COMPREHENSIVE METABOLIC PANEL WITH GFR
ALT: 21 U/L (ref 0–44)
AST: <10 U/L — ABNORMAL LOW (ref 15–41)
Albumin: 2 g/dL — ABNORMAL LOW (ref 3.5–5.0)
Alkaline Phosphatase: 82 U/L (ref 38–126)
Anion gap: 9 (ref 5–15)
BUN: 21 mg/dL — ABNORMAL HIGH (ref 6–20)
CO2: 20 mmol/L — ABNORMAL LOW (ref 22–32)
Calcium: 8 mg/dL — ABNORMAL LOW (ref 8.9–10.3)
Chloride: 109 mmol/L (ref 98–111)
Creatinine, Ser: 0.53 mg/dL (ref 0.44–1.00)
GFR, Estimated: >60 mL/min (ref >60)
Glucose, Bld: 179 mg/dL — ABNORMAL HIGH (ref 70–99)
Potassium: 3.7 mmol/L (ref 3.5–5.1)
Sodium: 137 mmol/L (ref 135–145)
Total Bilirubin: 0.6 mg/dL (ref 0.0–1.2)
Total Protein: 6 g/dL — ABNORMAL LOW (ref 6.5–8.1)

## 2024-05-25 LAB — ECHOCARDIOGRAM COMPLETE
Area-P 1/2: 4.78 cm2
Height: 65 in
S' Lateral: 2.6 cm
Weight: 2804.25 [oz_av]

## 2024-05-25 LAB — LACTIC ACID, PLASMA: Lactic Acid, Venous: 3 mmol/L (ref 0.5–1.9)

## 2024-05-25 LAB — MAGNESIUM: Magnesium: 2.3 mg/dL (ref 1.7–2.4)

## 2024-05-25 MED ORDER — PREDNISONE 20 MG PO TABS
60.0000 mg | ORAL_TABLET | Freq: Every day | ORAL | Status: DC
  Administered 2024-05-25 – 2024-05-26 (×2): 60 mg via ORAL
  Filled 2024-05-25 (×2): qty 3

## 2024-05-25 MED ORDER — CHLORHEXIDINE GLUCONATE CLOTH 2 % EX PADS
6.0000 | MEDICATED_PAD | Freq: Every day | CUTANEOUS | Status: DC
  Administered 2024-05-26: 6 via TOPICAL

## 2024-05-25 MED ORDER — PREDNISONE 20 MG PO TABS: 60.0000 mg | ORAL_TABLET | Freq: Every day | ORAL | Status: DC

## 2024-05-25 MED ORDER — LACTATED RINGERS IV SOLN: INTRAVENOUS | Status: DC

## 2024-05-25 MED FILL — Immune Globulin (Human) IV Soln 10 GM/100ML: INTRAVENOUS | Qty: 100 | Status: AC

## 2024-05-25 MED FILL — Immune Globulin (Human) IV Soln 20 GM/200ML: INTRAVENOUS | Qty: 200 | Status: AC

## 2024-05-25 MED FILL — Immune Globulin (Human) IV Soln 40 GM/400ML: INTRAVENOUS | Qty: 400 | Status: AC

## 2024-05-25 NOTE — Progress Notes (Signed)
 Progress Note    Laurie Terry   FMW:[id number]  DOB: [birth year]  DOA: 05/21/2024     3 PCP: [name], MD  Initial CC: lethargy  Hospital Course: 42 yof w/ history significant for esophageal spasm, aortic aneurysm, bicuspid aortic valve recently diagnosed recently with giant cell arteritis as well as PMR on high-dose steroid being admitted to the hospital with acute thrombocytopenia.   Recently diagnosed with GCA and PMR a month ago, saw a rheumatologist and has been on high-dose steroids. Her PMR symptoms (joint pains) resolved very rapidly, however she has continued to have low-grade headaches and vision changes. Over the last few days, she was having fever, severe lethargy, and relative hypotension with no focal symptoms, or symptoms of acute infection, no bruising or bleeding. She went to her PCP for evaluation and labs showed with platelet count 30K and referred to the ED   In ED: BP soft 80s-90s, afebrile heart rate 70s-100, not hypoxic,CBC showed low platelets of 23,000, hematology was consulted and admitted for further management.  9/2: Hemodynamically stable with sustained bradycardia in high 20s to low 30s started yesterday evening-cardiology was consulted.  No need for any pacemaker, likely metabolic driven due to sepsis.  Advising to discontinue midodrine  and if needed patient will get benefit from dopamine or levo infusion for hypotension.  Assessment and plan:  Acute Thrombocytopenia Recent clinical diagnosis GCA Recent diagnosis PMR - Found to have abnormal labs and routine PCP workup for low-grade fever fatigue-in the setting of high-dose steroid for recently diagnosed GCA/PMR/autoimmune disorder. - GCA diagnosis appears to be a clinical diagnosis outpatient.  No temporal artery biopsy performed - Obtaining temporal artery vascular ultrasound; likely no utility in ordering inflammatory markers given underlying infection - with further collateral info: known  history of ocular migraines with aura's and recent headaches have been similar with aura (but slightly different) however she never had decreased vision or vision loss. She has had photosensitivity with this prolonged episode and symptoms since ~June (?prolonged complicated migraine? Instead of diagnosis of GCA especially without biopsy or other modalities. She does state inflammatory marker was elevated at rheumatology evaluation and with presentation she was then diagnosed with GCA) - Need to ensure outpatient follow-up with rheumatology (Dr. Jon Jacob) - Seen by hematology initially and started on 2 days of IVIG - Still no improvement in platelets as of 9/1 and started on Nplate  - Platelets with some improvement to 37 today.  No sign of any active bleeding. - Continue to monitor  Klebsiella bacteremia  Severe sepsis-resolved - No obvious urinary symptoms although does report some very mild dysuria prior to admission and even some suprapubic discomfort; on exam she also has right CVA tenderness.  CT A/P also showed bilateral perinephric fat stranding concerning for pyelonephritis.  Outpatient last Friday she had urine culture performed which showed 50-100 K mixed urogenital flora.  Most likely source seems to be urinary at this time.  She did have some GI symptoms also prior to admission but those did not seem significant - Sensitivities reviewed, continue Rocephin  for now - Given complicated infection with underlying immunocompromised state, will pursue ID consult for antibiotic length along with how long to remain on IV-message sent to Dr. Dennise.  Sinus bradycardia.  Heart rate recorded in high 20s to low 30s. Cardiology was consulted, remained asymptomatic. Per cardiology most likely due to metabolic derangements secondary to sepsis.  EKG with sinus bradycardia and isorhythmic dissociation, no evidence of high-grade conduction disease. - Midodrine   was discontinued - If needed patient will  get benefit from dopamine or levo - Can use atropine if needed.  Abdominal pain and diarrhea.  Patient with worsening abdominal pain, had 6 episodes of watery diarrhea yesterday, just 1 since this morning, abdominal pain worsening.  No nausea or vomiting.  Patient is on antibiotics. -Less likely C. difficile but patient is high risk - ID was consulted - Supportive care  Low TSH.  Patient was found to have mildly low TSH and free T4, likely secondary to sepsis. - Need repeat levels in 4 to 6 weeks by PCP  Normocytic anemia - No obvious bleeding but with thrombocytopenia, hemoglobin has downtrended some since admission - Hgb 10.0 g/dL this morning - If further downtrend tomorrow, may need to consider platelet and/for blood transfusion  Prediabetes - A1c 6.4%.  Hyperglycemia likely in setting of steroid use - Controlled for now, but if up trends further will need to start on sliding scale  Mild metabolic acidosis -Stable in the low 20s, lactic acid improving but still at 3 -Giving some more IV fluid -Monitor lactic acid  Interval History:  Patient was sitting comfortably in chair when seen today.  She was complaining of worsening abdominal pain, had 6 episodes of loose watery stool yesterday, just 1 since this morning.  No nausea or vomiting.  Continue to have intermittent ocular migraines.  She is being seen by ophthalmology. Husband is at bedside  Old records reviewed in assessment of this patient  Antimicrobials: Rocephin  05/23/2024 >> current  DVT prophylaxis:  Place and maintain sequential compression device Start: 05/23/24 0944   Code Status:   Code Status: Full Code  Mobility Assessment (Last 72 Hours)     Mobility Assessment     Row Name 05/25/24 0800 05/24/24 2010 05/24/24 0840 05/23/24 2300 05/23/24 0937   Does the patient have exclusion criteria? No - Perform mobility assessment No - Perform mobility assessment No - Perform mobility assessment No - Perform mobility  assessment No - Perform mobility assessment   What is the highest level of mobility based on the mobility assessment? Level 5 (Ambulates independently) - Balance while walking independently - Complete Level 5 (Ambulates independently) - Balance while walking independently - Complete Level 5 (Ambulates independently) - Balance while walking independently - Complete Level 5 (Ambulates independently) - Balance while walking independently - Complete Level 5 (Ambulates independently) - Balance while walking independently - Complete    Row Name 05/22/24 20:04:30           Does the patient have exclusion criteria? No - Perform mobility assessment       What is the highest level of mobility based on the mobility assessment? Level 5 (Ambulates independently) - Balance while walking independently - Complete          Barriers to discharge:  Disposition Plan: Home HH orders placed: N/A Status is: Inpatient  Objective: Blood pressure (!) 95/59, pulse (!) 50, temperature 97.9 F (36.6 C), temperature source Oral, resp. rate 18, height 5' 5 (1.651 m), weight 79.5 kg, SpO2 93%.  Examination:  General.  Well-developed lady, in no acute distress. Pulmonary.  Lungs clear bilaterally, normal respiratory effort. CV.  Mild sinus bradycardia, no JVD, rub or murmur. Abdomen.  Soft, nontender, nondistended, BS positive. CNS.  Alert and oriented .  No focal neurologic deficit. Extremities.  No edema, no cyanosis, pulses intact and symmetrical. Psychiatry.  Judgment and insight appears normal.    Consultants:  Hematology  Procedures:  None  Data Reviewed: Results for orders placed or performed during the hospital encounter of 05/21/24 (from the past 24 hours)  T4, free     Status: Abnormal   Collection Time: 05/24/24  7:30 PM  Result Value Ref Range   Free T4 0.57 (L) 0.61 - 1.12 ng/dL  TSH     Status: Abnormal   Collection Time: 05/24/24  7:30 PM  Result Value Ref Range   TSH 0.311 (L) 0.350 -  4.500 uIU/mL  Lactic acid, plasma     Status: Abnormal   Collection Time: 05/24/24  7:30 PM  Result Value Ref Range   Lactic Acid, Venous 3.5 (HH) 0.5 - 1.9 mmol/L  MRSA Next Gen by PCR, Nasal     Status: None   Collection Time: 05/24/24  8:14 PM   Specimen: Nasal Mucosa; Nasal Swab  Result Value Ref Range   MRSA by PCR Next Gen NOT DETECTED NOT DETECTED  CBC with Differential/Platelet     Status: Abnormal   Collection Time: 05/25/24  3:18 AM  Result Value Ref Range   WBC 13.2 (H) 4.0 - 10.5 K/uL   RBC 3.59 (L) 3.87 - 5.11 MIL/uL   Hemoglobin 10.0 (L) 12.0 - 15.0 g/dL   HCT 67.9 (L) 63.9 - 53.9 %   MCV 89.1 80.0 - 100.0 fL   MCH 27.9 26.0 - 34.0 pg   MCHC 31.3 30.0 - 36.0 g/dL   RDW 81.6 (H) 88.4 - 84.4 %   Platelets 37 (L) 150 - 400 K/uL   nRBC 0.2 0.0 - 0.2 %   Neutrophils Relative % 97 %   Neutro Abs 12.8 (H) 1.7 - 7.7 K/uL   Lymphocytes Relative 2 %   Lymphs Abs 0.3 (L) 0.7 - 4.0 K/uL   Monocytes Relative 1 %   Monocytes Absolute 0.1 0.1 - 1.0 K/uL   Eosinophils Relative 0 %   Eosinophils Absolute 0.0 0.0 - 0.5 K/uL   Basophils Relative 0 %   Basophils Absolute 0.0 0.0 - 0.1 K/uL   WBC Morphology See Note   Magnesium      Status: None   Collection Time: 05/25/24  3:18 AM  Result Value Ref Range   Magnesium  2.3 1.7 - 2.4 mg/dL  Comprehensive metabolic panel with GFR     Status: Abnormal   Collection Time: 05/25/24  3:18 AM  Result Value Ref Range   Sodium 137 135 - 145 mmol/L   Potassium 3.7 3.5 - 5.1 mmol/L   Chloride 109 98 - 111 mmol/L   CO2 20 (L) 22 - 32 mmol/L   Glucose, Bld 179 (H) 70 - 99 mg/dL   BUN 21 (H) 6 - 20 mg/dL   Creatinine, Ser 9.46 0.44 - 1.00 mg/dL   Calcium 8.0 (L) 8.9 - 10.3 mg/dL   Total Protein 6.0 (L) 6.5 - 8.1 g/dL   Albumin 2.0 (L) 3.5 - 5.0 g/dL   AST <89 (L) 15 - 41 U/L   ALT 21 0 - 44 U/L   Alkaline Phosphatase 82 38 - 126 U/L   Total Bilirubin 0.6 0.0 - 1.2 mg/dL   GFR, Estimated >39 >39 mL/min   Anion gap 9 5 - 15  Lactic  acid, plasma     Status: Abnormal   Collection Time: 05/25/24  9:05 AM  Result Value Ref Range   Lactic Acid, Venous 3.0 (HH) 0.5 - 1.9 mmol/L    I have reviewed pertinent nursing notes, vitals, labs, and images as necessary. I have ordered  labwork to follow up on as indicated.  I have reviewed the last notes from staff over past 24 hours. I have discussed patient's care plan and test results with nursing staff, CM/SW, and other staff as appropriate.  Time spent: Greater than 50% of the 50 minute visit was spent in counseling/coordination of care for the patient as laid out in the A&P.   LOS: 3 days   Amaryllis Dare, MD [facility] 05/25/2024, 1:55 PM

## 2024-05-25 NOTE — Consult Note (Signed)
 Cardiology Consultation   Patient ID: Laurie Terry MRN: [id number]; DOB: 07-Mar-1966  Admit date: 05/21/2024 Date of Consult: 05/25/2024  PCP:  [name], MD   [facility] Providers Cardiologist:  Stanly DELENA Leavens, MD      Patient Profile: Laurie Terry is a 58 y.o. female with a hx of esophageal spasm, paroxysmal SVT, mild thoracic aortic aneurysm and autoimmune disorders [ PMR and Giant Cell arteritis on high dose steroids] who is being seen 05/25/2024 for the evaluation of sinus bradycardia at the request of Amaryllis Dare MD.  History of Present Illness: [name] follows with Dr. Leavens for her history of pSVT and family history of aortopathy. She has prn metoprolol  for her palpitations. Echocardiogram is used for TAA monitoring 2/2 contrast allergy. Most recent echocardiogram shows LVEF 60-65% with no RWMA. Normal diastolic parameters. Mildly dilated LA. Tricuspid aortic valve with no disease. Aortic dilatation ~75mm.   Approximately 6 weeks ago presented to her rheumatologist with migraine and vision change. She was diagnosed clinically with GCA and started on high dose steroids.   Presented to the ED on 8/30 at the request of her PCP 2/2 thrombocytopenia. Patient had been experiencing fever and fatigue. In the ED BP was low, most notable: 75/54. She reported that her normal blood pressure is low though typically 90-105 systolic. HR at this time normal [ ~85] ED Labs notable for: Glucose 319  WBC 10.4   Hgb 12.7   Platelet 23K  LDH 193 Lactic acid 2.8 albumin 2.6 [chronic hypoalbuminemia]   UA +leukocytes CXR No acute cardiopulmonary findings CT A/P evidence of pyelonephritis and trace right pleural effusion. Hypodensities in liver suspicious for infectious etiology. Diagnosed with Klebsiella bacteremia, now on IV antibiotics.  For hypotension has been receiving midodrine  10 mg TID, new this admission. Hematology following, she was initially  placed on IVIG for 2 days and they increased her steroid dose. She is now on Nplate  for her thrombocytopenia.  Trend Hgb [12.7 -> ~10] Lactic acid [ 2.8 ->2.3 -> 3.5]  Yesterday at 1745 noted to be bradycardic, ~56.  ECG 9/1: sinus bradycardia VR 33 TWI in V1, peaked T waves TSH 0.311  T4 0.57 Most recent vitals:  HR 31  BP 102/71  RR 24   SpO2 94% on RA  On interview patient feels overall fine. Denied lightheadedness/dizziness and chest pain. She did note getting short of breath last night. No complications when she stood to get in the chair, has not been walking around the unit 2/2 pelvic pain. She reported that since she started taking the midodrine  she has not felt right. Does notice palpitations, though this is not a new symptom, has been happening since she started the prednisone . Has not used her prn metoprolol  in months. Diarrhea improving, no dark stools or bright red stools. Husband and daughter have hypothyroidism, patient denied history of thyroid  issues.   Past Medical History:  Diagnosis Date   Allergic rhinitis    Aortic aneurysm (HCC) 09/23/2020   Cancer (HCC)    Melanoma   Cervical dysplasia 2007   low-grade SIL, normal Pap smears since then   Family history of adverse reaction to anesthesia    mom has h/o post op nausea/vomiting   Intermittent palpitations    IUD    inserted 03/2002.04/06/2007, 06/03/2012   Ocular migraine    Osteomalacia    Pancreatitis    Seafood allergy     Past Surgical History:  Procedure Laterality Date   BIOPSY  01/21/2022   Procedure: BIOPSY;  Surgeon: Dianna Specking, MD;  Location: [facility];  Service: Gastroenterology;;   ROMAYNE  2018   CHOLECYSTECTOMY N/A 04/09/2016   Procedure: LAPAROSCOPIC CHOLECYSTECTOMY ;  Surgeon: Donnice Bury, MD;  Location: [facility] OR;  Service: General;  Laterality: N/A;   ERCP  04/11/2016   ERCP with pancreatic stent and failed CBD stent after needle knife and conventional sphincterotomy   ERCP N/A  04/12/2016   Procedure: ENDOSCOPIC RETROGRADE CHOLANGIOPANCREATOGRAPHY (ERCP);  Surgeon: Oliva Boots, MD;  Location: [facility] END[facility]COPY;  Service: Endoscopy;  Laterality: N/A;   ERCP N/A 01/15/2022   Procedure: ENDOSCOPIC RETROGRADE CHOLANGIOPANCREATOGRAPHY (ERCP);  Surgeon: Boots Oliva, MD;  Location: [facility];  Service: Gastroenterology;  Laterality: N/A;   FLEXIBLE SIGMOIDOSCOPY N/A 01/21/2022   Procedure: FLEXIBLE SIGMOIDOSCOPY;  Surgeon: Dianna Specking, MD;  Location: [facility];  Service: Gastroenterology;  Laterality: N/A;   FOOT SURGERY     toenail   INTRAUTERINE DEVICE INSERTION  08/07/2017   Mirena    IR EXCHANGE BILIARY DRAIN  02/11/2022   IR RADIOLOGIST EVAL & MGMT  02/08/2022   IR RADIOLOGIST EVAL & MGMT  02/20/2022   IR RADIOLOGIST EVAL & MGMT  02/28/2022   removal of melanoma     REMOVAL OF STONES  01/15/2022   Procedure: REMOVAL OF STONES;  Surgeon: Boots Oliva, MD;  Location: [facility];  Service: Gastroenterology;;   ANNETT  01/15/2022   Procedure: ANNETT;  Surgeon: Boots Oliva, MD;  Location: [facility];  Service: Gastroenterology;;       Scheduled Meds:  atovaquone   750 mg Oral BID   Chlorhexidine  Gluconate Cloth  6 each Topical QHS   famotidine   40 mg Oral Daily   [START ON 05/26/2024] predniSONE   60 mg Oral Q breakfast   Continuous Infusions:  cefTRIAXone  (ROCEPHIN )  IV Stopped (05/24/24 0909)   PRN Meds: acetaminophen , albuterol , diphenhydrAMINE  **AND** metoCLOPramide  (REGLAN ) injection, loperamide , ondansetron  **OR** ondansetron  (ZOFRAN ) IV, mouth rinse, oxyCODONE , polyethylene glycol, traZODone   Allergies:    Allergies  Allergen Reactions   Contrast Media [Iodinated Contrast Media] Shortness Of Breath    Low blood pressure    Iodine Shortness Of Breath and Swelling   Other Hives and Swelling    Seafood   Augmentin  [Amoxicillin -Pot Clavulanate] Nausea And Vomiting   Avocado Itching    Tongue and mouth   Levaquin [Levofloxacin]     Not  recommended in Aortic aneurysm patients.   Levofloxacin Hives    Pt already has aortic aneurysm.   Pantoprazole  Other (See Comments)    Joint aches, fever, peeling of skin   Vancomycin  Cough    Patient develops cough, rash ears, tingling lips after receiving Cefepime  and vancomycin  01/23/2022   Colestipol  Hcl Rash    Social History:   Social History   Socioeconomic History   Marital status: Married    Spouse name: Marcey Blumenthal   Number of children: 4   Years of education: Not on file   Highest education level: Not on file  Occupational History   Not on file  Tobacco Use   Smoking status: Former    Passive exposure: Past   Smokeless tobacco: Never  Vaping Use   Vaping status: Never Used  Substance and Sexual Activity   Alcohol use: Yes   Drug use: No   Sexual activity: Yes    Partners: Male    Birth control/protection: Post-menopausal    Comment: des neg  Other Topics Concern   Not on file  Social History Narrative  Not on file   Social Drivers of Health   Financial Resource Strain: Not on file  Food Insecurity: No Food Insecurity (05/22/2024)   Hunger Vital Sign    Worried About Running Out of Food in the Last Year: Never true    Ran Out of Food in the Last Year: Never true  Transportation Needs: No Transportation Needs (05/22/2024)   PRAPARE - Administrator, Civil Service (Medical): No    Lack of Transportation (Non-Medical): No  Physical Activity: Not on file  Stress: Not on file  Social Connections: Moderately Integrated (05/22/2024)   Social Connection and Isolation Panel    Frequency of Communication with Friends and Family: More than three times a week    Frequency of Social Gatherings with Friends and Family: More than three times a week    Attends Religious Services: Never    Database administrator or Organizations: Yes    Attends Engineer, structural: More than 4 times per year    Marital Status: Married  Catering manager  Violence: Not At Risk (05/22/2024)   Humiliation, Afraid, Rape, and Kick questionnaire    Fear of Current or Ex-Partner: No    Emotionally Abused: No    Physically Abused: No    Sexually Abused: No    Family History:   Family History  Problem Relation Age of Onset   Tremor Mother    Stroke Father    Autoimmune disease Father    CVA Father    Breast cancer Sister 78       3X   Arthritis Sister        Psoriatic   Thyroid  disease Sister    Breast cancer Maternal Grandmother        Age 54   Thyroid  disease Daughter        Hypo   Uterine cancer Neg Hx    Bladder Cancer Neg Hx    Renal cancer Neg Hx      ROS:  Please see the history of present illness.  All other ROS reviewed and negative.     Physical Exam/Data: Vitals:   05/25/24 0400 05/25/24 0500 05/25/24 0600 05/25/24 0800  BP: 138/69 133/67 102/71   Pulse: (!) 29 (!) 30 (!) 31   Resp: 15 13 (!) 24   Temp: 97.7 F (36.5 C)   98 F (36.7 C)  TempSrc: Oral   Oral  SpO2: 96% 96% 94%   Weight:      Height:        Intake/Output Summary (Last 24 hours) at 05/25/2024 0910 Last data filed at 05/24/2024 2354 Gross per 24 hour  Intake 600 ml  Output --  Net 600 ml      05/24/2024    8:10 PM 05/22/2024   10:39 AM 05/22/2024    7:30 AM  Last 3 Weights  Weight (lbs) 175 lb 4.3 oz 151 lb 14.4 oz 151 lb 14.4 oz  Weight (kg) 79.5 kg 68.9 kg 68.9 kg     Body mass index is 29.17 kg/m.  General:  Well nourished, well developed, in no acute distress HEENT: normal Vascular: Distal pulses 2+ bilaterally Cardiac:  Regular rhythm, bradycardic rate, no murmur  Lungs:  clear to auscultation bilaterally, no wheezing, rhonchi or rales   Ext: 2+ pitting edema to ankles Musculoskeletal:  No deformities, BUE and BLE strength normal and equal Skin: warm and dry  Neuro:  CNs 2-12 intact, no focal abnormalities noted Psych:  Normal  affect   EKG:  The EKG was personally reviewed and demonstrates: Sinus bradycardia 40 bpm Telemetry:   Telemetry was personally reviewed and demonstrates:  sinus bradycardia HR avg ~30. Occasional PAC  Relevant CV Studies: Echocardiogram 11/24/2023 IMPRESSIONS     1. Left ventricular ejection fraction, by estimation, is 60 to 65%. Left  ventricular ejection fraction by 3D volume is 68 %. The left ventricle has  normal function. The left ventricle has no regional wall motion  abnormalities. Left ventricular diastolic   parameters were normal. The average left ventricular global longitudinal  strain is -26.1 %. The global longitudinal strain is normal.   2. Right ventricular systolic function is normal. The right ventricular  size is normal. Tricuspid regurgitation signal is inadequate for assessing  PA pressure.   3. Left atrial size was mildly dilated.   4. The mitral valve is normal in structure. Trivial mitral valve  regurgitation. No evidence of mitral stenosis.   5. The aortic valve is tricuspid. Aortic valve regurgitation is not  visualized. No aortic stenosis is present.   6. Aortic dilatation noted. There is mild dilatation of the aortic root,  measuring 39 mm.   7. The inferior vena cava is normal in size with greater than 50%  respiratory variability, suggesting right atrial pressure of 3 mmHg.   Calcium Score CT 03/03/2023  IMPRESSION: No significant extracardiac findings within the visualized chest. 1. Coronary calcium score of 0. This was 1st percentile for age, gender, and race matched controls   Laboratory Data: High Sensitivity Troponin:   Recent Labs  Lab 05/07/24 1209 05/07/24 1410  TROPONINIHS 4 4     Chemistry Recent Labs  Lab 05/22/24 0700 05/23/24 0513 05/25/24 0318  NA 139 137 137  K 3.5 3.6 3.7  CL 106 104 109  CO2 22 21* 20*  GLUCOSE 90 88 179*  BUN 24* 24* 21*  CREATININE 0.54 0.77 0.53  CALCIUM 8.0* 8.0* 8.0*  MG  --   --  2.3  GFRNONAA >60 >60 >60  ANIONGAP 12 12 9     Recent Labs  Lab 05/21/24 1800 05/22/24 0700 05/25/24 0318   PROT 4.9* 4.2* 6.0*  ALBUMIN 2.6* 2.3* 2.0*  AST 16 11* <10*  ALT 30 24 21   ALKPHOS 140* 144* 82  BILITOT 0.9 0.9 0.6   Lipids No results for input(s): CHOL, TRIG, HDL, LABVLDL, LDLCALC, CHOLHDL in the last 168 hours.  Hematology Recent Labs  Lab 05/23/24 0513 05/24/24 0505 05/25/24 0318  WBC 7.6 8.9 13.2*  RBC 4.06 3.34* 3.59*  HGB 11.3* 9.7* 10.0*  HCT 35.4* 30.1* 32.0*  MCV 87.2 90.1 89.1  MCH 27.8 29.0 27.9  MCHC 31.9 32.2 31.3  RDW 17.5* 18.3* 18.3*  PLT 20* 18* 37*   Thyroid   Recent Labs  Lab 05/24/24 1930  TSH 0.311*  FREET4 0.57*    BNPNo results for input(s): BNP, PROBNP in the last 168 hours.  DDimer No results for input(s): DDIMER in the last 168 hours.  Radiology/Studies:  CT ABDOMEN PELVIS WO CONTRAST Result Date: 05/24/2024 CLINICAL DATA:  Sepsis EXAM: CT ABDOMEN AND PELVIS WITHOUT CONTRAST TECHNIQUE: Multidetector CT imaging of the abdomen and pelvis was performed following the standard protocol without IV contrast. RADIATION DOSE REDUCTION: This exam was performed according to the departmental dose-optimization program which includes automated exposure control, adjustment of the mA and/or kV according to patient size and/or use of iterative reconstruction technique. COMPARISON:  CT chest abdomen and pelvis 05/07/2024. CT abdomen and  pelvis 03/29/2022. FINDINGS: Lower chest: There is a trace right pleural effusion. Hepatobiliary: Patient is status post cholecystectomy. Pneumobilia is again seen. There are 5 new rounded hypodensities in the right lobe of the liver which are too small to characterize, indeterminate. Pancreas: Unremarkable. No pancreatic ductal dilatation or surrounding inflammatory changes. Spleen: Normal in size without focal abnormality. Adrenals/Urinary Tract: There is mild bilateral perinephric fat stranding, mildly increased from prior. No urinary tract calculus or hydronephrosis. The adrenal glands and bladder are within  normal limits. Stomach/Bowel: Stomach is within normal limits. Appendix appears normal. No evidence of bowel wall thickening, distention, or inflammatory changes. Vascular/Lymphatic: No significant vascular findings are present. No enlarged abdominal or pelvic lymph nodes. Reproductive: Uterus and bilateral adnexa are unremarkable. Other: There is a small amount of free fluid in the pelvis. There is no focal abdominal wall hernia. Musculoskeletal: No acute or significant osseous findings. IMPRESSION: 1. Mild bilateral perinephric fat stranding, mildly increased from prior. Correlate clinically for pyelonephritis. 2. Small amount of free fluid in the pelvis. 3. Trace right pleural effusion. 4. New subcentimeter hypodensities in the liver are too small to characterize. Given that these were not definitely seen on 05/07/2024, differential diagnosis includes infectious etiologies. Electronically Signed   By: Greig Pique M.D.   On: 05/24/2024 00:29   DG CHEST PORT 1 VIEW Result Date: 05/22/2024 EXAM: 1 VIEW XRAY OF THE CHEST 05/22/2024 07:44:00 AM COMPARISON: 05/07/2024 Stable left lung base linear opacities compatible with atelectasis/scar. CLINICAL HISTORY: Fever [phone]. Per chart: Patient c/o weakness x 1 week. Patient was seen by PCP today and recommended to be seen in ED for low platelet count. Patient report nausea, denies vomiting. Patient denies Chest pain and SOB. FINDINGS: LUNGS AND PLEURA: No focal pulmonary opacity. No pulmonary edema. No pleural effusion. No pneumothorax. HEART AND MEDIASTINUM: No acute abnormality of the cardiac and mediastinal silhouettes. BONES AND SOFT TISSUES: No acute osseous abnormality. IMPRESSION: 1. No acute findings. 2. Stable left lung base linear opacities compatible with atelectasis/scar. Electronically signed by: Waddell Calk MD 05/22/2024 07:59 AM EDT RP Workstation: [id number]     Assessment and Plan: # Sinus Bradycardia # Isorhythmic Dissociation  - EKGs and  telemetry personally reviewed.  They show evidence of sinus bradycardia with isorhythmic dissociation.  There is no evidence of high-grade conduction disease.  There is no indication for pacing.  Her current presentation of sinus bradycardia with isorhythmic dissociation is driven by metabolic derangements including sepsis.  She is also acidotic with a lactic acidosis. - If needed we could pursue a dopamine infusion.  Her heart rate is in the 40s when talking to me.  I see no need for discussion of pacing or indications for this.  I believe this will ultimately improve with resolution of her sepsis and critical illness. - Can keep atropine at the bedside but suspect this will not be necessary.  Cardiology to follow along.  We we will repeat her echocardiogram. - Would follow-up TSH in 4-6 weeks.  Cardiology to follow along.  # Septic Shock # Klebsiella pneumonia bacteremia # Giant cell arteritis # Acute thrombocytopenia # Lactic acidosis - Patiently is currently admitted to the ICU with hypotension and lactic acidosis.  She has bacteremia as well.  Will continue antibiotics.  If she remains hypotensive would recommend discussion with critical care medicine for Levophed infusion.  I do not believe midodrine  would be indicated at this point. - Her bradycardia is being driven by her critical illness.  This should be  treated per hospital medicine team.  For questions or updates, please contact [facility] Please consult [email or web address] for contact info under    Signed, Darryle T. Barbaraann, MD, [facility]  [facility]  [address] [place], [place] [address] [phone]  10:00 AM

## 2024-05-25 NOTE — Plan of Care (Signed)

## 2024-05-25 NOTE — Progress Notes (Signed)
 Laurie Terry   DOB:08/08/1966   FM#:[id number]    ASSESSMENT & PLAN:   Acute thrombocytopenia She developed acute thrombocytopenia in the setting of recent treatment with prednisone  for giant cell arteritis, prescribed by her rheumatologist.  I reviewed all her labs extensively.  She has positive anti-CCP antibody and strong family history of rheumatoid arthritis in her father She has received 2 doses of IVIG Blood culture came back positive I suspect her acute thrombocytopenia is due to infection She received 1 dose of Nplate  on May 24, 2024 Platelet count is stable today Monitor closely Transfuse platelet if bleeding or platelet count less than 10,000   Giant cell arteritis/autoimmune disorder She was started on steroids/prednisone  by her rheumatologist We will continue for few days although I am not sure if she does have giant cell arteritis as it is usually associated with thrombocytosis and her symptoms have not fully improved with high-dose steroids so far I plan to reduce prednisone  down to 60 mg   Mild dysuria I suspect this could be related to atrophic vaginitis Urinalysis is so far unremarkable She will continue topical vaginal estrogen cream   Bacteremia Appreciate ID consult She is responding to IV antibiotics CT is pending   Elevated blood sugar Likely steroid-induced We discussed dietary modification while on high-dose steroids  Severe bradycardia Cause is unknown Cardiology workup in progress  Discharge planning Unknown I will follow   [name], MD 05/25/2024 8:56 AM  Subjective:  Events over the weekend were noted.  The patient was noted to have bacteremia from positive blood culture but she has been afebrile since she was started on IV antibiotics Her platelet initially dropped but is now trending up She is noted to have severe bradycardia, cardiology workup in progress  Objective:  Vitals:   05/25/24 0600 05/25/24 0800  BP: 102/71    Pulse: (!) 31   Resp: (!) 24   Temp:  98 F (36.7 C)  SpO2: 94%      Intake/Output Summary (Last 24 hours) at 05/25/2024 0856 Last data filed at 05/24/2024 2354 Gross per 24 hour  Intake 600 ml  Output --  Net 600 ml

## 2024-05-26 ENCOUNTER — Inpatient Hospital Stay (HOSPITAL_COMMUNITY)

## 2024-05-26 DIAGNOSIS — D696 Thrombocytopenia, unspecified: Secondary | ICD-10-CM | POA: Diagnosis not present

## 2024-05-26 DIAGNOSIS — R001 Bradycardia, unspecified: Secondary | ICD-10-CM | POA: Diagnosis not present

## 2024-05-26 DIAGNOSIS — R300 Dysuria: Secondary | ICD-10-CM | POA: Diagnosis not present

## 2024-05-26 DIAGNOSIS — K750 Abscess of liver: Secondary | ICD-10-CM

## 2024-05-26 DIAGNOSIS — B961 Klebsiella pneumoniae [K. pneumoniae] as the cause of diseases classified elsewhere: Secondary | ICD-10-CM | POA: Diagnosis not present

## 2024-05-26 DIAGNOSIS — M315 Giant cell arteritis with polymyalgia rheumatica: Secondary | ICD-10-CM | POA: Diagnosis not present

## 2024-05-26 LAB — COMPREHENSIVE METABOLIC PANEL WITH GFR
ALT: 20 U/L (ref 0–44)
AST: 10 U/L — ABNORMAL LOW (ref 15–41)
Albumin: 2 g/dL — ABNORMAL LOW (ref 3.5–5.0)
Alkaline Phosphatase: 78 U/L (ref 38–126)
Anion gap: 11 (ref 5–15)
BUN: 16 mg/dL (ref 6–20)
CO2: 21 mmol/L — ABNORMAL LOW (ref 22–32)
Calcium: 7.8 mg/dL — ABNORMAL LOW (ref 8.9–10.3)
Chloride: 108 mmol/L (ref 98–111)
Creatinine, Ser: 0.53 mg/dL (ref 0.44–1.00)
GFR, Estimated: >60 mL/min (ref >60)
Glucose, Bld: 170 mg/dL — ABNORMAL HIGH (ref 70–99)
Potassium: 3.5 mmol/L (ref 3.5–5.1)
Sodium: 140 mmol/L (ref 135–145)
Total Bilirubin: 0.7 mg/dL (ref 0.0–1.2)
Total Protein: 5.5 g/dL — ABNORMAL LOW (ref 6.5–8.1)

## 2024-05-26 LAB — CBC WITH DIFFERENTIAL/PLATELET
Abs Immature Granulocytes: 0.98 K/uL — ABNORMAL HIGH (ref 0.00–0.07)
Basophils Absolute: 0.1 K/uL (ref 0.0–0.1)
Basophils Relative: 0 %
Eosinophils Absolute: 0 K/uL (ref 0.0–0.5)
Eosinophils Relative: 0 %
HCT: 28.5 % — ABNORMAL LOW (ref 36.0–46.0)
Hemoglobin: 9.4 g/dL — ABNORMAL LOW (ref 12.0–15.0)
Immature Granulocytes: 8 %
Lymphocytes Relative: 8 %
Lymphs Abs: 1 K/uL (ref 0.7–4.0)
MCH: 29.5 pg (ref 26.0–34.0)
MCHC: 33 g/dL (ref 30.0–36.0)
MCV: 89.3 fL (ref 80.0–100.0)
Monocytes Absolute: 0.3 K/uL (ref 0.1–1.0)
Monocytes Relative: 2 %
Neutro Abs: 10.7 K/uL — ABNORMAL HIGH (ref 1.7–7.7)
Neutrophils Relative %: 82 %
Platelets: 49 K/uL — ABNORMAL LOW (ref 150–400)
RBC: 3.19 MIL/uL — ABNORMAL LOW (ref 3.87–5.11)
RDW: 17.3 % — ABNORMAL HIGH (ref 11.5–15.5)
WBC: 13 K/uL — ABNORMAL HIGH (ref 4.0–10.5)
nRBC: 0.4 % — ABNORMAL HIGH (ref 0.0–0.2)

## 2024-05-26 LAB — LACTIC ACID, PLASMA: Lactic Acid, Venous: 2.9 mmol/L (ref 0.5–1.9)

## 2024-05-26 LAB — MAGNESIUM: Magnesium: 2.2 mg/dL (ref 1.7–2.4)

## 2024-05-26 MED ORDER — SACCHAROMYCES BOULARDII 250 MG PO CAPS
250.0000 mg | ORAL_CAPSULE | Freq: Two times a day (BID) | ORAL | Status: DC
  Administered 2024-05-26 – 2024-05-30 (×8): 250 mg via ORAL
  Filled 2024-05-26 (×9): qty 1

## 2024-05-26 MED ORDER — SODIUM CHLORIDE 0.9 % IV SOLN: INTRAVENOUS | Status: DC

## 2024-05-26 MED ORDER — BISACODYL 10 MG RE SUPP: 10.0000 mg | Freq: Every day | RECTAL | Status: DC | PRN

## 2024-05-26 MED ORDER — GADOBUTROL 1 MMOL/ML IV SOLN
8.0000 mL | Freq: Once | INTRAVENOUS | Status: AC | PRN
  Administered 2024-05-26: 8 mL via INTRAVENOUS

## 2024-05-26 NOTE — Progress Notes (Signed)
 Progress Note    Laurie Terry   FMW:[id number]  DOB: 1965/12/06  DOA: 05/21/2024     4 PCP: [name], MD  Initial CC: lethargy  Hospital Course: 4 yof w/ history significant for esophageal spasm, aortic aneurysm, bicuspid aortic valve recently diagnosed recently with giant cell arteritis as well as PMR on high-dose steroid being admitted to the hospital with acute thrombocytopenia.   Recently diagnosed with GCA and PMR a month ago, saw a rheumatologist and has been on high-dose steroids. Her PMR symptoms (joint pains) resolved very rapidly, however she has continued to have low-grade headaches and vision changes. Over the last few days, she was having fever, severe lethargy, and relative hypotension with no focal symptoms, or symptoms of acute infection, no bruising or bleeding. She went to her PCP for evaluation and labs showed with platelet count 30K and referred to the ED  Found to have sepsis from Klebsiella bacteremia.  Patient was also found to have small abscesses on her liver (similar presentation back in 2023)  Assessment and plan:  Acute Thrombocytopenia Recent clinical diagnosis GCA Recent diagnosis PMR - Found to have abnormal labs and routine PCP workup for low-grade fever fatigue-in the setting of high-dose steroid for recently diagnosed GCA/PMR/autoimmune disorder. - GCA diagnosis appears to be a clinical diagnosis outpatient.  No temporal artery biopsy performed-it appears a vascular ultrasound of the temporal arteries was ordered this weekend and is being done on 9/3 - with further collateral info: known history of ocular migraines with aura's and recent headaches have been similar with aura (but slightly different) however she never had decreased vision or vision loss. She has had photosensitivity with this prolonged episode and symptoms since ~June (?prolonged complicated migraine? Instead of diagnosis of GCA especially without biopsy or other modalities. She  does state inflammatory marker was elevated at rheumatology evaluation and with presentation she was then diagnosed with GCA) - Need to ensure outpatient follow-up with rheumatology (Dr. Jon Jacob) - Seen by hematology initially and started on 2 days of IVIG with no improvement in platelets as of 9/1 she was then started on Nplate  - Platelets slowly improving - Continue to monitor  Abnormal liver MRI - IR consulted for possible drain -Very similar situation in May 2023  Klebsiella bacteremia  Severe sepsis-resolved - No obvious urinary symptoms although does report some very mild dysuria prior to admission and even some suprapubic discomfort; on exam she also has right CVA tenderness.  CT A/P also showed bilateral perinephric fat stranding concerning for pyelonephritis.  Outpatient last Friday she had urine culture performed which showed 50-100 K mixed urogenital flora.  Most likely source seems to be urinary at this time.  She did have some GI symptoms also prior to admission but those did not seem significant-May need to repeat CT scan (defer to ID) - Antibiotics per ID - Check postvoid residual  Sinus bradycardia.  Heart rate recorded in high 20s to low 30s. Cardiology was consulted, remained asymptomatic. Per cardiology most likely due to metabolic derangements secondary to sepsis.   Abdominal pain and diarrhea.  Patient with worsening abdominal pain, had 6 episodes of watery diarrhea yesterday, just 1 since this morning, abdominal pain worsening.  No nausea or vomiting.  Patient is on antibiotics. -Less likely C. difficile but patient is high risk -Monitor  Low TSH.  Patient was found to have mildly low TSH and free T4, likely secondary to sepsis. - Need repeat levels in 4 to 6 weeks by  PCP  Normocytic anemia - No obvious bleeding but with thrombocytopenia, hemoglobin has downtrended some since admission - Trend  Prediabetes - A1c 6.4%.  Hyperglycemia likely in setting of  steroid use - Controlled for now, but if up trends further will need to start on sliding scale  Mild metabolic acidosis - Continue to trend lactic acid  Interval History:  Complaining of lower abdominal pain worse with walking Mild lower extremity edema    DVT prophylaxis:  Place TED hose Start: 05/26/24 1022 Place and maintain sequential compression device Start: 05/23/24 0944   Code Status:   Code Status: Full Code     Objective: Blood pressure (!) 100/56, pulse (!) 46, temperature 98 F (36.7 C), temperature source Oral, resp. rate 15, height 5' 5 (1.651 m), weight 79.5 kg, SpO2 98%.     General: Appearance:     Overweight female in no acute distress     Lungs:      respirations unlabored  Heart:    Bradycardic.   MS:   All extremities are intact.   Neurologic:   Awake, alert, oriented x 3. No apparent focal neurological           defect.    Consultants:  Hematology ID cardiology    Data Reviewed: Results for orders placed or performed during the hospital encounter of 05/21/24 (from the past 24 hours)  CBC with Differential/Platelet     Status: Abnormal   Collection Time: 05/26/24  3:27 AM  Result Value Ref Range   WBC 13.0 (H) 4.0 - 10.5 K/uL   RBC 3.19 (L) 3.87 - 5.11 MIL/uL   Hemoglobin 9.4 (L) 12.0 - 15.0 g/dL   HCT 71.4 (L) 63.9 - 53.9 %   MCV 89.3 80.0 - 100.0 fL   MCH 29.5 26.0 - 34.0 pg   MCHC 33.0 30.0 - 36.0 g/dL   RDW 82.6 (H) 88.4 - 84.4 %   Platelets 49 (L) 150 - 400 K/uL   nRBC 0.4 (H) 0.0 - 0.2 %   Neutrophils Relative % 82 %   Neutro Abs 10.7 (H) 1.7 - 7.7 K/uL   Lymphocytes Relative 8 %   Lymphs Abs 1.0 0.7 - 4.0 K/uL   Monocytes Relative 2 %   Monocytes Absolute 0.3 0.1 - 1.0 K/uL   Eosinophils Relative 0 %   Eosinophils Absolute 0.0 0.0 - 0.5 K/uL   Basophils Relative 0 %   Basophils Absolute 0.1 0.0 - 0.1 K/uL   WBC Morphology See Note    Immature Granulocytes 8 %   Abs Immature Granulocytes 0.98 (H) 0.00 - 0.07 K/uL   Magnesium      Status: None   Collection Time: 05/26/24  3:27 AM  Result Value Ref Range   Magnesium  2.2 1.7 - 2.4 mg/dL  Comprehensive metabolic panel with GFR     Status: Abnormal   Collection Time: 05/26/24  3:27 AM  Result Value Ref Range   Sodium 140 135 - 145 mmol/L   Potassium 3.5 3.5 - 5.1 mmol/L   Chloride 108 98 - 111 mmol/L   CO2 21 (L) 22 - 32 mmol/L   Glucose, Bld 170 (H) 70 - 99 mg/dL   BUN 16 6 - 20 mg/dL   Creatinine, Ser 9.46 0.44 - 1.00 mg/dL   Calcium 7.8 (L) 8.9 - 10.3 mg/dL   Total Protein 5.5 (L) 6.5 - 8.1 g/dL   Albumin 2.0 (L) 3.5 - 5.0 g/dL   AST 10 (L) 15 - 41 U/L  ALT 20 0 - 44 U/L   Alkaline Phosphatase 78 38 - 126 U/L   Total Bilirubin 0.7 0.0 - 1.2 mg/dL   GFR, Estimated >39 >39 mL/min   Anion gap 11 5 - 15  Lactic acid, plasma     Status: Abnormal   Collection Time: 05/26/24  3:27 AM  Result Value Ref Range   Lactic Acid, Venous 2.9 (HH) 0.5 - 1.9 mmol/L    I have reviewed pertinent nursing notes, vitals, labs, and images as necessary. I have ordered labwork to follow up on as indicated.  I have reviewed the last notes from staff over past 24 hours. I have discussed patient's care plan and test results with nursing staff, CM/SW, and other staff as appropriate.  Time spent: Greater than 50% of the 50 minute visit was spent in counseling/coordination of care for the patient as laid out in the A&P.   LOS: 4 days   Rosalee Tolley U Kynslie Ringle, DO [facility] 05/26/2024, 12:23 PM

## 2024-05-26 NOTE — Progress Notes (Addendum)
 [facility]  Date of Admission:  05/21/2024   Total days of inpatient antibiotics 3  Principal Problem:   Thrombocytopenia (HCC) Active Problems:   Bacteremia due to Klebsiella pneumoniae   Normocytic anemia   Sinus bradycardia          Assessment: 28 YF with past medical history of bicuspid aortic valve, aortic aneurysm, recently due to stress arteritis and PMR started on high-dose rest few months ago, prophylaxis with atovaqoune, Hx of intra-abdomianal (subhepatic/perinephric) abscess following ercp in 2023 SP IR drainage cx + efaecalis + yeast treated with augmentin   + fluconzole admitted with:   #Klebsiella pneumoniae bacteremia 2/2 liver abscess #mild dysuria -PT presented with lethargy and hypotension.Plt 23 on admission.  Blood Cx + Kleb pneomo. Ucx on 8/29 + leukocytes, did not reflex to Cx -CT on 8/31 showed mild bilateral perinephric fat stranding, mildly prior stasis.  new subcentimeter hypodensity in the liver too small to characterize.  Differential includes infectious etiology.  Definitely not seen on imaging on 05/07/2024. - On review of 2023 admission, ct did not show abscess, fluid was noted on MRCP in subhepatic/perinephric space  -MRCP showed 8 scattered slightly Kuster hyperenhancing lesions in the liver lesions rim-enhancing concern for small abscesses given that were not present on imaging on 03/29/2022.  Complicated assessment of biliary tree due to pneumobilia and motion artifact.   #GCA and PMR on steroids #Thrombocytopenia -H/O following for thrombocytopenia, sps IVIG x 2 days, plt 37 -continue atovaqoune. Noted that suspect thrombocypenia 2/2 infection. She is on appropriate abx with limited improvent in plt count, thrombocytopenia likely multifactorial.    #Diarrhea -resolved. But stool less formed today, will add probiotics   #Amox intolerance -Although she tolerated it in 2023. Notes that she had N/V following abx the  last 2 times she was on it.    Recommendations:  -Engage IR for possible aspiration and cultures including AFB, fungal, bacterial of liver abscess -Consider engaging GI given complicated biliary tree with pneumobilia and prior intra-abdominal abscess. -Continue ceftriaxone  and add metronidazole  -Standard precautions     Microbiology:   Antibiotics: Atovaqoune-> pjp ppx Ceftiaxone 8/31- Cultures: Blood 8/30 1/1 klebseilla pneumoniae Urine 8/29 postive leukocytes, rare bacteria, negative nitrites, did not reflex to Cx  SUBJECTIVE: Still in bed. Interval: Afebrile overnight.  WBC 13K.  Review of Systems: Review of Systems  All other systems reviewed and are negative.    Scheduled Meds:  atovaquone   750 mg Oral BID   Chlorhexidine  Gluconate Cloth  6 each Topical Daily   famotidine   40 mg Oral Daily   predniSONE   60 mg Oral Q breakfast   Continuous Infusions:  cefTRIAXone  (ROCEPHIN )  IV 2 g (05/26/24 1032)   PRN Meds:.acetaminophen , albuterol , bisacodyl , diphenhydrAMINE  **AND** metoCLOPramide  (REGLAN ) injection, loperamide , ondansetron  **OR** ondansetron  (ZOFRAN ) IV, mouth rinse, oxyCODONE , polyethylene glycol, traZODone  Allergies  Allergen Reactions   Contrast Media [Iodinated Contrast Media] Shortness Of Breath    Low blood pressure    Iodine Shortness Of Breath and Swelling   Midodrine  Shortness Of Breath   Other Hives and Swelling    Seafood   Augmentin  [Amoxicillin -Pot Clavulanate] Nausea And Vomiting   Avocado Itching    Tongue and mouth   Levaquin [Levofloxacin]     Not recommended in Aortic aneurysm patients.   Levofloxacin Hives    Pt already has aortic aneurysm.   Pantoprazole  Other (See Comments)    Joint aches, fever, peeling of skin  Vancomycin  Cough    Patient develops cough, rash ears, tingling lips after receiving Cefepime  and vancomycin  01/23/2022   Colestipol  Hcl Rash    OBJECTIVE: Vitals:   05/26/24 1024 05/26/24 1100 05/26/24 1101  05/26/24 1200  BP: 94/65  (!) 92/43 (!) 100/56  Pulse: (!) 52 60 (!) 59 (!) 46  Resp: 14 12 (!) 9 15  Temp:    98.1 F (36.7 C)  TempSrc:    Oral  SpO2: 98% 98% 98% 98%  Weight:      Height:       Body mass index is 29.17 kg/m.  Physical Exam Constitutional:      Appearance: Normal appearance.  HENT:     Head: Normocephalic and atraumatic.     Right Ear: Tympanic membrane normal.     Left Ear: Tympanic membrane normal.     Nose: Nose normal.     Mouth/Throat:     Mouth: Mucous membranes are moist.  Eyes:     Extraocular Movements: Extraocular movements intact.     Conjunctiva/sclera: Conjunctivae normal.     Pupils: Pupils are equal, round, and reactive to light.  Cardiovascular:     Rate and Rhythm: Normal rate and regular rhythm.     Heart sounds: No murmur heard.    No friction rub. No gallop.  Pulmonary:     Effort: Pulmonary effort is normal.     Breath sounds: Normal breath sounds.  Abdominal:     General: Abdomen is flat.     Palpations: Abdomen is soft.  Musculoskeletal:        General: Normal range of motion.  Skin:    General: Skin is warm and dry.  Neurological:     General: No focal deficit present.     Mental Status: She is alert and oriented to person, place, and time.  Psychiatric:        Mood and Affect: Mood normal.       Lab Results Lab Results  Component Value Date   WBC 13.0 (H) 05/26/2024   HGB 9.4 (L) 05/26/2024   HCT 28.5 (L) 05/26/2024   MCV 89.3 05/26/2024   PLT 49 (L) 05/26/2024    Lab Results  Component Value Date   CREATININE 0.53 05/26/2024   BUN 16 05/26/2024   NA 140 05/26/2024   K 3.5 05/26/2024   CL 108 05/26/2024   CO2 21 (L) 05/26/2024    Lab Results  Component Value Date   ALT 20 05/26/2024   AST 10 (L) 05/26/2024   ALKPHOS 78 05/26/2024   BILITOT 0.7 05/26/2024        Loney Stank, MD [facility] 05/26/2024, 12:33 PM

## 2024-05-26 NOTE — Plan of Care (Signed)

## 2024-05-26 NOTE — Progress Notes (Addendum)
 Rounding Note   Patient Name: Laurie Terry Date of Encounter: 05/26/2024  [facility] Cardiologist: Laurie DELENA Leavens, MD   Subjective Doing well. Her breathing improved since cessation of midodrine . She has been lightheadedness this morning but she thinks it may be due to just waking up. Was able to walk around yesterday with no issue. Denied dizziness, chest pain, shortness of breath. She does noticed the peripheral edema, not painful.    Scheduled Meds:  atovaquone   750 mg Oral BID   Chlorhexidine  Gluconate Cloth  6 each Topical Daily   famotidine   40 mg Oral Daily   predniSONE   60 mg Oral Q breakfast   Continuous Infusions:  cefTRIAXone  (ROCEPHIN )  IV Stopped (05/25/24 1039)   lactated ringers  100 mL/hr at 05/26/24 0315   PRN Meds: acetaminophen , albuterol , diphenhydrAMINE  **AND** metoCLOPramide  (REGLAN ) injection, loperamide , ondansetron  **OR** ondansetron  (ZOFRAN ) IV, mouth rinse, oxyCODONE , polyethylene glycol, traZODone    Vital Signs  Vitals:   05/26/24 0400 05/26/24 0415 05/26/24 0500 05/26/24 0600  BP:  113/66 121/72 134/67  Pulse: (!) 36 (!) 39 (!) 34 (!) 34  Resp: 12 12 10 13   Temp: 97.7 F (36.5 C)     TempSrc: Oral     SpO2: 95% 95% 96% 97%  Weight:      Height:        Intake/Output Summary (Last 24 hours) at 05/26/2024 0724 Last data filed at 05/26/2024 0315 Gross per 24 hour  Intake 1114.17 ml  Output --  Net 1114.17 ml      05/24/2024    8:10 PM 05/22/2024   10:39 AM 05/22/2024    7:30 AM  Last 3 Weights  Weight (lbs) 175 lb 4.3 oz 151 lb 14.4 oz 151 lb 14.4 oz  Weight (kg) 79.5 kg 68.9 kg 68.9 kg      Telemetry Sinus bradycardia with isorhythmic escape occasionally, avg HR ~40, no AV block noted, PACS noted - Personally Reviewed   Physical Exam GEN: No acute distress.   Neck: No carotid bruits Cardiac: regular rhythm, bradycardic, no murmurs  Respiratory: Clear to auscultation bilaterally. MS: 1+ edema to BLE; No  deformity. Neuro:  Nonfocal  Psych: Normal affect   Cardiac Studies Echocardiogram 05/25/24 IMPRESSIONS     1. Left ventricular ejection fraction, by estimation, is 55 to 60%. The  left ventricle has normal function. The left ventricle has no regional  wall motion abnormalities. Left ventricular diastolic parameters are  indeterminate.   2. Right ventricular systolic function is normal. The right ventricular  size is normal.   3. The mitral valve is normal in structure. No evidence of mitral valve  regurgitation. No evidence of mitral stenosis.   4. The aortic valve is normal in structure. Aortic valve regurgitation is  not visualized. No aortic stenosis is present.   5. There is borderline dilatation of the ascending aorta, measuring 38  mm.   6. The inferior vena cava is normal in size with greater than 50%  respiratory variability, suggesting right atrial pressure of 3 mmHg.   Patient Profile   58 y.o. female  with a hx of esophageal spasm, paroxysmal SVT, mild thoracic aortic aneurysm and autoimmune disorders [ PMR and Giant Cell arteritis on high dose steroids who presented to her PCP on 8/30 for fatigue and fevers, she was found to be thrombocytopenic and was referred to the ED. Hematology following. She was also found to have klebsiella bacteremia, ID following. Cardiology was consulted for sinus bradycardia.  Assessment & Plan  Sinus Bradycardia Isorhythmic Dissociation  Driven by metabolic derangements 2/2 bacteremia with lactic acidosis. The is no indication for pacing at this time. Patient remains asymptomatic.   If patient becomes symptomatic, would recommend dopamine infusion. Can keep atropine at bedside.  Echocardiogram this admission unremarkable with LVEF 55-60% with no valvular disease.  TSH/T4 both low, most likely related to acute presentation and steroid use, would repeat in 4-6 weeks.   Septic Shock Klebsiella pneumonia bacteremia Lactic acidosis Per  primary, would recommend against midodrine  for hypotension if she becomes symptomatic, her baseline blood pressure is low. Most recent BP:129/75  Peripheral edema Hypoalbuminemia Noted in dependent location, patient receiving substantial IV carrier fluids this admission. Her albumin is 2.  Will hold on diuretic at this time given her blood pressure, though improved, is still soft at times. Could consider tomorrow if peripheral edema is not improved after patient focusing on leg elevation today.   Per primary PMR GCA Prediabetes Thrombocytopenia Anemia   For questions or updates, please contact [facility] Please consult [email or web address] for contact info under     Signed, [name], PA-C  05/26/2024, 7:24 AM

## 2024-05-26 NOTE — Progress Notes (Signed)
 Temporal artery duplex has been completed.   Results can be found under chart review under CV PROC. 05/26/2024 5:42 PM Aalayah Riles RVT, RDMS

## 2024-05-26 NOTE — Consult Note (Signed)
 [facility]    Date of Admission:  05/21/2024   Total days of inpatient antibiotics 3        Reason for Consult: Bacteremia    Principal Problem:   Thrombocytopenia (HCC) Active Problems:   Bacteremia due to Klebsiella pneumoniae   Normocytic anemia   Assessment:  Laurie Terry with past medical history of bicuspid aortic valve, aortic aneurysm, recently due to stress arteritis and PMR started on high-dose rest few months ago, prophylaxis with atovaqoune, Hx of intra-abdomianal (subhepatic/perinephric) abscess following ercp in 2023 SP IR drainage cx + efaecalis + yeast treated with augmentin   + fluconzole admitted with:  #Klebsiella pneumoniae bacteremia #mild dysuria -PT presented with lethargy and hypotension.Plt 23 on admission.  Blood Cx + Kleb pneomo. Ucx on 8/29 + leukocytes, did not reflex to Cx -CT on 8/31 showed mild bilateral perinephric fat stranding, mildly prior stasis.  new subcentimeter hypodensity in the liver too small to characterize.  Differential includes infectious etiology.  Definitely not seen on imaging on 05/07/2024. - On review of 2023 admission, ct did not show abscess, fluid was noted on MRCP in subhepatic/perinephric space   #GCA and PMR on steroids #Thrombocytopenia -H/O following for thrombocytopenia, sps IVIG x 2 days, plt 37 -continue atovaqoune. Noted that suspect thrombocypenia 2/2 infection. She is on appropriate abx with limited improvent in plt count, thrombocytopenia likely multifactorial.   #Diarrhea -resolved  #Amox intolerance -Although she tolerated it in 2023. Notes that she had N/V following abx the last 2 times she was on it.   Recommendations:  -MRCP -Consider engaging GI given prior hs of abscesses pending MRCP -Continue ceftriaxone  -Had a formed BM today, hold off on stool tetitng as diarrhea resolved -Standard precautions  Evaluation of this patient requires complex antimicrobial therapy  evaluation and counseling + isolation needs for disease transmission risk assessment and mitigation   Microbiology:   Antibiotics: Atovaqoune-> pjp ppx Ceftiaxone 8/31- Cultures: Blood 8/30 1/1 klebseilla pneumoniae Urine 8/29 postive leukocytes, rare bacteria, negative nitrites, did not reflex to Cx Other   HPI: Laurie Terry is a 58 y.o. female with past medical history of esophageal spasm, aortic aneurysm, bicuspid aortic valve, recent diagnosis of giant cell arteritis as well as PMR on high-dose steroids admitted with acute thrombocytopenia.  Patient was diagnosed with giant cell arteritis PMR several months ago, saw rheumatology and started on steroids.  She is also on atovaquone  for prophylaxis.  She has continued to have some low-grade headaches and vision changes although the rest of her symptoms are resolved.  Prior to admission she had fever, severe lethargy and relative hypotension few days prior.  At PCPs office platelets of 30.  She was sent to the ED.  On arrival WBC 23K.  Blood cultures grew Klebsiella pneumonia, ID engaged. She had had about 6 loose stools yesterday, ideation for stool testing as well.  CT abdomen pelvis on 8/31 showed mild bilateral perinephric fat stranding, mildly prior stasis.  new subcentimeter hypodensity in the liver too small to characterize.  Differential includes infectious etiology.  Definitely not seen on imaging on 05/07/2024.   Review of Systems: Review of Systems  All other systems reviewed and are negative.   Past Medical History:  Diagnosis Date   Allergic rhinitis    Aortic aneurysm (HCC) 09/23/2020   Cancer (HCC)    Melanoma   Cervical dysplasia 2007   low-grade SIL, normal Pap smears since then  Family history of adverse reaction to anesthesia    mom has h/o post op nausea/vomiting   Intermittent palpitations    IUD    inserted 03/2002.04/06/2007, 06/03/2012   Ocular migraine    Osteomalacia    Pancreatitis    Seafood  allergy     Social History   Tobacco Use   Smoking status: Former    Passive exposure: Past   Smokeless tobacco: Never  Vaping Use   Vaping status: Never Used  Substance Use Topics   Alcohol use: Yes   Drug use: No    Family History  Problem Relation Age of Onset   Tremor Mother    Stroke Father    Autoimmune disease Father    CVA Father    Breast cancer Sister 6       3X   Arthritis Sister        Psoriatic   Thyroid  disease Sister    Breast cancer Maternal Grandmother        Age 37   Thyroid  disease Daughter        Hypo   Uterine cancer Neg Hx    Bladder Cancer Neg Hx    Renal cancer Neg Hx    Scheduled Meds:  atovaquone   750 mg Oral BID   Chlorhexidine  Gluconate Cloth  6 each Topical Daily   famotidine   40 mg Oral Daily   predniSONE   Laurie mg Oral Q breakfast   Continuous Infusions:  cefTRIAXone  (ROCEPHIN )  IV Stopped (05/25/24 1039)   lactated ringers  100 mL/hr at 05/26/24 0315   PRN Meds:.acetaminophen , albuterol , diphenhydrAMINE  **AND** metoCLOPramide  (REGLAN ) injection, loperamide , ondansetron  **OR** ondansetron  (ZOFRAN ) IV, mouth rinse, oxyCODONE , polyethylene glycol, traZODone  Allergies  Allergen Reactions   Contrast Media [Iodinated Contrast Media] Shortness Of Breath    Low blood pressure    Iodine Shortness Of Breath and Swelling   Other Hives and Swelling    Seafood   Augmentin  [Amoxicillin -Pot Clavulanate] Nausea And Vomiting   Avocado Itching    Tongue and mouth   Levaquin [Levofloxacin]     Not recommended in Aortic aneurysm patients.   Levofloxacin Hives    Pt already has aortic aneurysm.   Pantoprazole  Other (See Comments)    Joint aches, fever, peeling of skin   Vancomycin  Cough    Patient develops cough, rash ears, tingling lips after receiving Cefepime  and vancomycin  01/23/2022   Colestipol  Hcl Rash    OBJECTIVE: Blood pressure 121/72, pulse (!) 34, temperature 97.7 F (36.5 C), temperature source Oral, resp. rate 10, height 5' 5  (1.651 m), weight 79.5 kg, SpO2 96%.  Physical Exam Constitutional:      Appearance: Normal appearance.  HENT:     Head: Normocephalic and atraumatic.     Right Ear: Tympanic membrane normal.     Left Ear: Tympanic membrane normal.     Nose: Nose normal.     Mouth/Throat:     Mouth: Mucous membranes are moist.  Eyes:     Extraocular Movements: Extraocular movements intact.     Conjunctiva/sclera: Conjunctivae normal.     Pupils: Pupils are equal, round, and reactive to light.  Cardiovascular:     Rate and Rhythm: Normal rate and regular rhythm.     Heart sounds: No murmur heard.    No friction rub. No gallop.  Pulmonary:     Effort: Pulmonary effort is normal.     Breath sounds: Normal breath sounds.  Abdominal:     General: Abdomen is flat.  Palpations: Abdomen is soft.  Musculoskeletal:        General: Normal range of motion.  Skin:    General: Skin is warm and dry.  Neurological:     General: No focal deficit present.     Mental Status: She is alert and oriented to person, place, and time.  Psychiatric:        Mood and Affect: Mood normal.     Lab Results Lab Results  Component Value Date   WBC 13.0 (H) 05/26/2024   HGB 9.4 (L) 05/26/2024   HCT 28.5 (L) 05/26/2024   MCV 89.3 05/26/2024   PLT 49 (L) 05/26/2024    Lab Results  Component Value Date   CREATININE 0.53 05/26/2024   BUN 16 05/26/2024   NA 140 05/26/2024   K 3.5 05/26/2024   CL 108 05/26/2024   CO2 21 (L) 05/26/2024    Lab Results  Component Value Date   ALT 20 05/26/2024   AST 10 (L) 05/26/2024   ALKPHOS 78 05/26/2024   BILITOT 0.7 05/26/2024       Loney Stank, MD [facility] 05/26/2024, 6:00 AM

## 2024-05-27 ENCOUNTER — Inpatient Hospital Stay (HOSPITAL_COMMUNITY)

## 2024-05-27 ENCOUNTER — Other Ambulatory Visit (HOSPITAL_COMMUNITY)

## 2024-05-27 DIAGNOSIS — B961 Klebsiella pneumoniae [K. pneumoniae] as the cause of diseases classified elsewhere: Secondary | ICD-10-CM | POA: Diagnosis not present

## 2024-05-27 DIAGNOSIS — K750 Abscess of liver: Secondary | ICD-10-CM | POA: Diagnosis not present

## 2024-05-27 DIAGNOSIS — R300 Dysuria: Secondary | ICD-10-CM | POA: Diagnosis not present

## 2024-05-27 DIAGNOSIS — M899 Disorder of bone, unspecified: Secondary | ICD-10-CM | POA: Insufficient documentation

## 2024-05-27 DIAGNOSIS — R001 Bradycardia, unspecified: Secondary | ICD-10-CM | POA: Diagnosis not present

## 2024-05-27 DIAGNOSIS — D696 Thrombocytopenia, unspecified: Secondary | ICD-10-CM | POA: Diagnosis not present

## 2024-05-27 DIAGNOSIS — M315 Giant cell arteritis with polymyalgia rheumatica: Secondary | ICD-10-CM | POA: Diagnosis not present

## 2024-05-27 LAB — CBC WITH DIFFERENTIAL/PLATELET
Abs Immature Granulocytes: 1.54 K/uL — ABNORMAL HIGH (ref 0.00–0.07)
Basophils Absolute: 0.1 K/uL (ref 0.0–0.1)
Basophils Relative: 1 %
Eosinophils Absolute: 0 K/uL (ref 0.0–0.5)
Eosinophils Relative: 0 %
HCT: 32.3 % — ABNORMAL LOW (ref 36.0–46.0)
Hemoglobin: 9.9 g/dL — ABNORMAL LOW (ref 12.0–15.0)
Immature Granulocytes: 10 %
Lymphocytes Relative: 8 %
Lymphs Abs: 1.1 K/uL (ref 0.7–4.0)
MCH: 28.2 pg (ref 26.0–34.0)
MCHC: 30.7 g/dL (ref 30.0–36.0)
MCV: 92 fL (ref 80.0–100.0)
Monocytes Absolute: 0.4 K/uL (ref 0.1–1.0)
Monocytes Relative: 3 %
Neutro Abs: 11.6 K/uL — ABNORMAL HIGH (ref 1.7–7.7)
Neutrophils Relative %: 78 %
Platelets: 80 K/uL — ABNORMAL LOW (ref 150–400)
RBC: 3.51 MIL/uL — ABNORMAL LOW (ref 3.87–5.11)
RDW: 16.8 % — ABNORMAL HIGH (ref 11.5–15.5)
Smear Review: NORMAL
WBC: 14.8 K/uL — ABNORMAL HIGH (ref 4.0–10.5)
nRBC: 0.9 % — ABNORMAL HIGH (ref 0.0–0.2)

## 2024-05-27 LAB — COMPREHENSIVE METABOLIC PANEL WITH GFR
ALT: 22 U/L (ref 0–44)
AST: 14 U/L — ABNORMAL LOW (ref 15–41)
Albumin: 2 g/dL — ABNORMAL LOW (ref 3.5–5.0)
Alkaline Phosphatase: 82 U/L (ref 38–126)
Anion gap: 10 (ref 5–15)
BUN: 12 mg/dL (ref 6–20)
CO2: 25 mmol/L (ref 22–32)
Calcium: 7.9 mg/dL — ABNORMAL LOW (ref 8.9–10.3)
Chloride: 104 mmol/L (ref 98–111)
Creatinine, Ser: 0.47 mg/dL (ref 0.44–1.00)
GFR, Estimated: >60 mL/min (ref >60)
Glucose, Bld: 90 mg/dL (ref 70–99)
Potassium: 3.6 mmol/L (ref 3.5–5.1)
Sodium: 139 mmol/L (ref 135–145)
Total Bilirubin: 1 mg/dL (ref 0.0–1.2)
Total Protein: 5.6 g/dL — ABNORMAL LOW (ref 6.5–8.1)

## 2024-05-27 LAB — PROTIME-INR
INR: 1 (ref 0.8–1.2)
Prothrombin Time: 14.2 s (ref 11.4–15.2)

## 2024-05-27 LAB — MAGNESIUM: Magnesium: 2.4 mg/dL (ref 1.7–2.4)

## 2024-05-27 LAB — LACTIC ACID, PLASMA: Lactic Acid, Venous: 2.4 mmol/L (ref 0.5–1.9)

## 2024-05-27 MED ORDER — MORPHINE SULFATE (PF) 2 MG/ML IV SOLN
1.0000 mg | INTRAVENOUS | Status: DC | PRN
  Administered 2024-05-27: 2 mg via INTRAVENOUS
  Filled 2024-05-27: qty 1

## 2024-05-27 MED ORDER — MIDAZOLAM HCL 2 MG/2ML IJ SOLN
INTRAMUSCULAR | Status: AC | PRN
  Administered 2024-05-27: 1 mg via INTRAVENOUS

## 2024-05-27 MED ORDER — FENTANYL CITRATE (PF) 100 MCG/2ML IJ SOLN
INTRAMUSCULAR | Status: AC | PRN
  Administered 2024-05-27: 25 ug via INTRAVENOUS

## 2024-05-27 MED ORDER — KETOROLAC TROMETHAMINE 15 MG/ML IJ SOLN
15.0000 mg | Freq: Once | INTRAMUSCULAR | Status: AC
  Administered 2024-05-28: 15 mg via INTRAVENOUS
  Filled 2024-05-27: qty 1

## 2024-05-27 MED ORDER — MORPHINE SULFATE 2 MG/ML IJ SOLN: 1.0000 mg | INTRAMUSCULAR | Status: DC | PRN

## 2024-05-27 MED ORDER — PREDNISONE 50 MG PO TABS
50.0000 mg | ORAL_TABLET | Freq: Every day | ORAL | Status: DC
  Administered 2024-05-28 – 2024-05-30 (×3): 50 mg via ORAL
  Filled 2024-05-27 (×3): qty 1

## 2024-05-27 MED ORDER — LIDOCAINE HCL 1 % IJ SOLN
INTRAMUSCULAR | Status: AC
  Filled 2024-05-27: qty 20

## 2024-05-27 MED ORDER — GADOBUTROL 1 MMOL/ML IV SOLN
8.0000 mL | Freq: Once | INTRAVENOUS | Status: AC | PRN
  Administered 2024-05-27: 8 mL via INTRAVENOUS

## 2024-05-27 MED ORDER — FLUMAZENIL 0.5 MG/5ML IV SOLN
INTRAVENOUS | Status: AC
Start: 2024-05-27 — End: 2024-05-27
  Filled 2024-05-27: qty 5

## 2024-05-27 MED ORDER — MIDAZOLAM HCL 2 MG/2ML IJ SOLN
INTRAMUSCULAR | Status: AC
  Filled 2024-05-27: qty 2

## 2024-05-27 MED ORDER — NALOXONE HCL 0.4 MG/ML IJ SOLN
INTRAMUSCULAR | Status: AC
Start: 2024-05-27 — End: 2024-05-27
  Filled 2024-05-27: qty 1

## 2024-05-27 MED ORDER — METRONIDAZOLE 500 MG PO TABS
500.0000 mg | ORAL_TABLET | Freq: Two times a day (BID) | ORAL | Status: DC
  Administered 2024-05-27 – 2024-05-30 (×6): 500 mg via ORAL
  Filled 2024-05-27 (×7): qty 1

## 2024-05-27 MED ORDER — DICLOFENAC SODIUM 1 % EX GEL
2.0000 g | Freq: Four times a day (QID) | CUTANEOUS | Status: DC
  Administered 2024-05-27 – 2024-05-29 (×6): 2 g via TOPICAL
  Filled 2024-05-27: qty 100

## 2024-05-27 MED ORDER — FENTANYL CITRATE (PF) 100 MCG/2ML IJ SOLN
INTRAMUSCULAR | Status: AC
  Filled 2024-05-27: qty 2

## 2024-05-27 NOTE — Progress Notes (Signed)
Patient placed on overnight pulse oximetry study

## 2024-05-27 NOTE — Progress Notes (Signed)
 Referring Physician(s): Laurie Terry,J/Laurie Terry,M  Supervising Physician: Laurie Terry  Patient Status:  Laurie Terry - In-pt  Chief Complaint: Enterobacter/klebsiella bacteremia, abdominal pain, liver lesions?abscesses; referred for image guided liver lesion(s) aspiration   Subjective: Pt known to IR team from perihepatic fluid collection aspiration 2017, thoracentesis 2023, RP abscess drain 2023. 58 yo female with hx esophageal spasm, aortic aneurysm, bicuspid aortic valve, GCA, PMR admitted with acute thrombocytopenia, lethargy, occ HA's ,hypotension, abd pain; found to have klebsiella/enterobacter bacteremia and liver lesions concerning for small abscesses; request now received for image guided liver lesion(s) aspiration. She is afebrile, WBC  14.8, hgb 9.9, plts 80k, PT/INR nl, creat nl, lactic acid 2.4; currently on flagyl , rocephin .   Past Medical History:  Diagnosis Date   Allergic rhinitis    Aortic aneurysm (HCC) 09/23/2020   Cancer (HCC)    Melanoma   Cervical dysplasia 2007   low-grade SIL, normal Pap smears since then   Family history of adverse reaction to anesthesia    mom has h/o post op nausea/vomiting   Intermittent palpitations    IUD    inserted 03/2002.04/06/2007, 06/03/2012   Ocular migraine    Osteomalacia    Pancreatitis    Seafood allergy    Past Surgical History:  Procedure Laterality Date   BIOPSY  01/21/2022   Procedure: BIOPSY;  Surgeon: Laurie Specking, MD;  Location: [facility];  Service: Gastroenterology;;   ROMAYNE  2018   CHOLECYSTECTOMY N/Terry 04/09/2016   Procedure: LAPAROSCOPIC CHOLECYSTECTOMY ;  Surgeon: Laurie Bury, MD;  Location: [facility] OR;  Service: General;  Laterality: N/Terry;   ERCP  04/11/2016   ERCP with pancreatic stent and failed CBD stent after needle knife and conventional sphincterotomy   ERCP N/Terry 04/12/2016   Procedure: ENDOSCOPIC RETROGRADE CHOLANGIOPANCREATOGRAPHY (ERCP);  Surgeon: Terry Boots, MD;  Location: [facility] ENDOSCOPY;  Service:  Endoscopy;  Laterality: N/Terry;   ERCP N/Terry 01/15/2022   Procedure: ENDOSCOPIC RETROGRADE CHOLANGIOPANCREATOGRAPHY (ERCP);  Surgeon: Laurie Oliva, MD;  Location: [facility];  Service: Gastroenterology;  Laterality: N/Terry;   FLEXIBLE SIGMOIDOSCOPY N/Terry 01/21/2022   Procedure: FLEXIBLE SIGMOIDOSCOPY;  Surgeon: Laurie Specking, MD;  Location: [facility];  Service: Gastroenterology;  Laterality: N/Terry;   FOOT SURGERY     toenail   INTRAUTERINE DEVICE INSERTION  08/07/2017   Mirena    IR EXCHANGE BILIARY DRAIN  02/11/2022   IR RADIOLOGIST EVAL & MGMT  02/08/2022   IR RADIOLOGIST EVAL & MGMT  02/20/2022   IR RADIOLOGIST EVAL & MGMT  02/28/2022   removal of melanoma     REMOVAL OF STONES  01/15/2022   Procedure: REMOVAL OF STONES;  Surgeon: Laurie Oliva, MD;  Location: [facility];  Service: Gastroenterology;;   Laurie Terry  01/15/2022   Procedure: Laurie Terry;  Surgeon: Laurie Oliva, MD;  Location: [facility];  Service: Gastroenterology;;      Allergies: Contrast media [iodinated contrast media], Iodine, Midodrine , Other, Augmentin  [amoxicillin -pot clavulanate], Avocado, Levaquin [levofloxacin], Levofloxacin, Pantoprazole , Vancomycin , and Colestipol  hcl  Medications: Prior to Admission medications   Medication Sig Start Date End Date Taking? Authorizing Provider  acetaminophen  (TYLENOL ) 500 MG tablet Take 500 mg by mouth every 6 (six) hours as needed.   Yes [provider]  Adapalene 0.3 % gel Apply 1 application  topically daily as needed. 04/29/22  Yes [provider]  alum & mag hydroxide-simeth (MAALOX PLUS) 400-400-40 MG/5ML suspension Take 15 mLs by mouth every 6 (six) hours as needed for indigestion. 05/07/24  Yes Laurie Darina RAMAN, MD  atovaquone  (MEPRON ) 750 MG/5ML  suspension Take 750 mg by mouth 2 (two) times daily.   Yes [provider]  dicyclomine  (BENTYL ) 20 MG tablet Take 1 tablet (20 mg total) by mouth 2 (two) times daily. Patient taking differently: Take  20 mg by mouth 2 (two) times daily as needed. 05/07/24  Yes Laurie Darina RAMAN, MD  EPINEPHrine  0.3 mg/0.3 mL IJ SOAJ injection Inject 0.3 mg into the muscle as needed for anaphylaxis. 10/08/22  Yes Laurie Arleta SQUIBB, MD  famotidine  (PEPCID ) 20 MG tablet Take 20 mg by mouth daily as needed for heartburn or indigestion.   Yes [provider]  fluticasone  (FLONASE ) 50 MCG/ACT nasal spray Place 2 sprays into both nostrils daily. Patient taking differently: Place 2 sprays into both nostrils daily as needed. 10/08/22  Yes Laurie Arleta SQUIBB, MD  metoprolol  tartrate (LOPRESSOR ) 25 MG tablet Take 1 tablet (25 mg total) by mouth daily as needed (palpitations). 01/27/24  Yes Chandrasekhar, Mahesh A, MD  predniSONE  (DELTASONE ) 20 MG tablet Take 30 mg by mouth 2 (two) times daily.   Yes [provider]  estradiol  (ESTRACE ) 0.1 MG/GM vaginal cream Place 0.5 Terry vaginally 2 (two) times Terry week. Place 0.5g nightly for two weeks then twice Terry week after Patient not taking: Reported on 05/21/2024 01/08/24   Zuleta, Kaitlin G, NP  nitrofurantoin , macrocrystal-monohydrate, (MACROBID ) 100 MG capsule Take 1 capsule (100 mg total) by mouth 2 (two) times daily. Patient not taking: Reported on 04/06/2024 02/26/24   Zuleta, Kaitlin G, NP  oxyCODONE -acetaminophen  (PERCOCET/ROXICET) 5-325 MG tablet Take 1 tablet by mouth every 6 (six) hours as needed for severe pain (pain score 7-10). Patient not taking: Reported on 05/21/2024 05/07/24   Laurie Darina RAMAN, MD     Vital Signs: BP 117/73 (BP Location: Right Arm)   Pulse (!) 41   Temp 98.5 F (36.9 C) (Oral)   Resp 18   Ht 5' 5 (1.651 m)   Wt 173 lb 4.5 oz (78.6 kg)   SpO2 97%   BMI 28.84 kg/m   Physical Exam: awake/alert; chest- CTA bilat; heart- sl bradycardic but reg rhythm; abd-soft,+BS,some mild tenderness RUQ, mild- mod suprapubic tenderness; no LE edema  Imaging: DG Pelvis Comp Min 3V Result Date: 05/27/2024 CLINICAL DATA:  Traumatic separation of the pubic  symphysis. EXAM: JUDET PELVIS - 3+ VIEW COMPARISON:  Abdomen and pelvis CT dated 05/23/2024. FINDINGS: The symphysis pubis is normal in width. There is some sclerosis in the adjacent pubis on the right and to Terry significantly lesser degree on the left. No fracture or dislocation. Normal bowel-gas pattern. IMPRESSION: Mild changes of osteitis pubis with no pubic diastasis seen. Electronically Signed   By: Elspeth Bathe M.D.   On: 05/27/2024 09:33   TEMPORAL ARTERY Result Date: 05/27/2024  TEMPORAL ARTERY REPORT Patient Name:  Laurie Terry [facility]  Date of Exam:   05/26/2024 Medical Rec #: [id number]            Accession #:    [id number] Date of Birth: 1966-09-19            Patient Gender: F Patient Age:   4 years Exam Location:  [facility] Procedure:      VAS US  TEMPORTAL ARTERY BILATERAL Referring Phys: ALM APO --------------------------------------------------------------------------------  Indications: Headache (recent diagnosis of giant cell arteritis & PMR on              high-dose steroids). High Risk Factors: Age > 50 yrs.  Comparison Study: No previous exams. Exam limited due  to patient's hairline. Performing Technologist: Ezzie Potters RVT, RDMS  Examination Guidelines: Patient in reclined position. 2D, color and spectral doppler sampling in the temporal artery along the hairline and temple in the longitudinal plane. 2D images along the hairline and temple in the transverse plane. Exam is bilateral.  Summary: Absence of Terry halo sign in the bilateral temporal artery, although not definitive, makes Terry diagnosis of temporal arteritis unlikely.  *See table(s) above for measurements and observations.  Electronically signed by Eather Popp MD on 05/27/2024 at 7:53:59 AM.   Final    MR ABDOMEN MRCP W WO CONTAST Result Date: 05/26/2024 CLINICAL DATA:  Sepsis (Klebsiella), abscess, liver lesions on recent CT abdomen. History of polymyalgia rheumatica and giant cell arteritis with steroid therapy. EXAM: MRI  ABDOMEN WITHOUT AND WITH CONTRAST (INCLUDING MRCP) TECHNIQUE: Multiplanar multisequence MR imaging of the abdomen was performed both before and after the administration of intravenous contrast. Heavily T2-weighted images of the biliary and pancreatic ducts were obtained, and three-dimensional MRCP images were rendered by post processing. CONTRAST:  8mL GADAVIST  GADOBUTROL  1 MMOL/ML IV SOLN COMPARISON:  05/23/2024 FINDINGS: Lower chest: Small bilateral pleural effusions. Upper normal heart size. Hepatobiliary: Prior cholecystectomy. About 8 scattered slightly clustered hypoenhancing lesions in the liver demonstrate rim enhancement concerning for small abscesses given these were not present on the prior exam from 03/29/2022 and in light of the patient's current clinical scenario. Index lesion centrally in the right hepatic lobe is bilobed or with Terry central septation and measures 1.5 by 1.0 cm. Motion artifact complicates the MRCP assessment. The CBD is not dilated but there are signal hypo intensities in the intrahepatic and extrahepatic biliary tree which most likely reflect pneumobilia based on the findings on recent CT. Pancreas:  Unremarkable Spleen:  Unremarkable Adrenals/Urinary Tract: No findings of pyelonephritis. Symmetric perirenal stranding. Stomach/Bowel: Unremarkable Vascular/Lymphatic:  Unremarkable Other: Pelvic ascites. Mild upper abdominal ascites and mesenteric stranding. Subcutaneous edema/stranding along both flanks. Musculoskeletal: Mild lumbar spondylosis and degenerative disc disease. S1 Tarlov cysts, left greater than right. IMPRESSION: 1. About 8 scattered slightly clustered hypoenhancing lesions in the liver demonstrate rim enhancement concerning for small abscesses given that these were not present on the prior exam from 03/29/2022 and in light of the patient's current clinical scenario. 2. Complicated assessment of the biliary tree due to pneumobilia and motion artifact. No biliary dilatation  observed. 3. Small bilateral pleural effusions. 4. Mild upper abdominal ascites and mesenteric stranding. 5. Subcutaneous edema/stranding along both flanks. 6. Mild lumbar spondylosis and degenerative disc disease. 7. S1 Tarlov cysts, left greater than right. Electronically Signed   By: Ryan Salvage M.D.   On: 05/26/2024 10:29   MR 3D Recon At Scanner Result Date: 05/26/2024 CLINICAL DATA:  Sepsis (Klebsiella), abscess, liver lesions on recent CT abdomen. History of polymyalgia rheumatica and giant cell arteritis with steroid therapy. EXAM: MRI ABDOMEN WITHOUT AND WITH CONTRAST (INCLUDING MRCP) TECHNIQUE: Multiplanar multisequence MR imaging of the abdomen was performed both before and after the administration of intravenous contrast. Heavily T2-weighted images of the biliary and pancreatic ducts were obtained, and three-dimensional MRCP images were rendered by post processing. CONTRAST:  8mL GADAVIST  GADOBUTROL  1 MMOL/ML IV SOLN COMPARISON:  05/23/2024 FINDINGS: Lower chest: Small bilateral pleural effusions. Upper normal heart size. Hepatobiliary: Prior cholecystectomy. About 8 scattered slightly clustered hypoenhancing lesions in the liver demonstrate rim enhancement concerning for small abscesses given these were not present on the prior exam from 03/29/2022 and in light of the patient's current clinical scenario. Index  lesion centrally in the right hepatic lobe is bilobed or with Terry central septation and measures 1.5 by 1.0 cm. Motion artifact complicates the MRCP assessment. The CBD is not dilated but there are signal hypo intensities in the intrahepatic and extrahepatic biliary tree which most likely reflect pneumobilia based on the findings on recent CT. Pancreas:  Unremarkable Spleen:  Unremarkable Adrenals/Urinary Tract: No findings of pyelonephritis. Symmetric perirenal stranding. Stomach/Bowel: Unremarkable Vascular/Lymphatic:  Unremarkable Other: Pelvic ascites. Mild upper abdominal ascites and  mesenteric stranding. Subcutaneous edema/stranding along both flanks. Musculoskeletal: Mild lumbar spondylosis and degenerative disc disease. S1 Tarlov cysts, left greater than right. IMPRESSION: 1. About 8 scattered slightly clustered hypoenhancing lesions in the liver demonstrate rim enhancement concerning for small abscesses given that these were not present on the prior exam from 03/29/2022 and in light of the patient's current clinical scenario. 2. Complicated assessment of the biliary tree due to pneumobilia and motion artifact. No biliary dilatation observed. 3. Small bilateral pleural effusions. 4. Mild upper abdominal ascites and mesenteric stranding. 5. Subcutaneous edema/stranding along both flanks. 6. Mild lumbar spondylosis and degenerative disc disease. 7. S1 Tarlov cysts, left greater than right. Electronically Signed   By: Ryan Salvage M.D.   On: 05/26/2024 10:29   ECHOCARDIOGRAM COMPLETE Result Date: 05/25/2024    ECHOCARDIOGRAM REPORT   Patient Name:   Laurie Terry [facility] Date of Exam: 05/25/2024 Medical Rec #:  [id number]           Height:       65.0 in Accession #:    [id number]          Weight:       175.3 lb Date of Birth:  [birth year]           BSA:          1.870 m Patient Age:    58 years            BP:           106/56 mmHg Patient Gender: F                   HR:           46 bpm. Exam Location:  Inpatient Procedure: 2D Echo, Cardiac Doppler and Color Doppler (Both Spectral and Color            Flow Doppler were utilized during procedure). Indications:    Bacteremia  History:        Patient has prior history of Echocardiogram examinations, most                 recent 11/24/2023. Risk Factors:Dyslipidemia.  Sonographer:    Philomena Daring Referring Phys: [id number] LOGAN N LOCKWOOD IMPRESSIONS  1. Left ventricular ejection fraction, by estimation, is 55 to 60%. The left ventricle has normal function. The left ventricle has no regional wall motion abnormalities. Left ventricular diastolic  parameters are indeterminate.  2. Right ventricular systolic function is normal. The right ventricular size is normal.  3. The mitral valve is normal in structure. No evidence of mitral valve regurgitation. No evidence of mitral stenosis.  4. The aortic valve is normal in structure. Aortic valve regurgitation is not visualized. No aortic stenosis is present.  5. There is borderline dilatation of the ascending aorta, measuring 38 mm.  6. The inferior vena cava is normal in size with greater than 50% respiratory variability, suggesting right atrial pressure of 3 mmHg. Conclusion(s)/Recommendation(s): No evidence of valvular vegetations on this transthoracic  echocardiogram. Consider Terry transesophageal echocardiogram to exclude infective endocarditis if clinically indicated. FINDINGS  Left Ventricle: Left ventricular ejection fraction, by estimation, is 55 to 60%. The left ventricle has normal function. The left ventricle has no regional wall motion abnormalities. The left ventricular internal cavity size was normal in size. There is  no left ventricular hypertrophy. Left ventricular diastolic parameters are indeterminate. Right Ventricle: The right ventricular size is normal. No increase in right ventricular wall thickness. Right ventricular systolic function is normal. Left Atrium: Left atrial size was normal in size. Right Atrium: Right atrial size was normal in size. Pericardium: There is no evidence of pericardial effusion. Mitral Valve: The mitral valve is normal in structure. No evidence of mitral valve regurgitation. No evidence of mitral valve stenosis. Tricuspid Valve: The tricuspid valve is normal in structure. Tricuspid valve regurgitation is not demonstrated. No evidence of tricuspid stenosis. Aortic Valve: The aortic valve is normal in structure. Aortic valve regurgitation is not visualized. No aortic stenosis is present. Pulmonic Valve: The pulmonic valve was normal in structure. Pulmonic valve  regurgitation is trivial. No evidence of pulmonic stenosis. Aorta: The aortic root is normal in size and structure. There is borderline dilatation of the ascending aorta, measuring 38 mm. Venous: The inferior vena cava is normal in size with greater than 50% respiratory variability, suggesting right atrial pressure of 3 mmHg. IAS/Shunts: No atrial level shunt detected by color flow Doppler.  LEFT VENTRICLE PLAX 2D LVIDd:         4.20 cm   Diastology LVIDs:         2.60 cm   LV e' medial:    7.07 cm/s LV PW:         1.00 cm   LV E/e' medial:  14.3 LV IVS:        1.00 cm   LV e' lateral:   12.30 cm/s LVOT diam:     2.00 cm   LV E/e' lateral: 8.2 LV SV:         72 LV SV Index:   39 LVOT Area:     3.14 cm  RIGHT VENTRICLE             IVC RV S prime:     10.60 cm/s  IVC diam: 2.10 cm TAPSE (M-mode): 2.1 cm LEFT ATRIUM             Index        RIGHT ATRIUM           Index LA diam:        2.50 cm 1.34 cm/m   RA Area:     14.20 cm LA Vol (A2C):   44.1 ml 23.58 ml/m  RA Volume:   30.60 ml  16.36 ml/m LA Vol (A4C):   37.1 ml 19.84 ml/m LA Biplane Vol: 40.7 ml 21.76 ml/m  AORTIC VALVE LVOT Vmax:   113.00 cm/s LVOT Vmean:  69.900 cm/s LVOT VTI:    0.230 m  AORTA Ao Root diam: 3.80 cm Ao Asc diam:  3.80 cm MITRAL VALVE MV Area (PHT): 4.78 cm     SHUNTS MV E velocity: 101.00 cm/s  Systemic VTI:  0.23 m                             Systemic Diam: 2.00 cm Oneil Parchment MD Electronically signed by Oneil Parchment MD Signature Date/Time: 05/25/2024/4:16:04 PM    Final    CT  ABDOMEN PELVIS WO CONTRAST Result Date: 05/24/2024 CLINICAL DATA:  Sepsis EXAM: CT ABDOMEN AND PELVIS WITHOUT CONTRAST TECHNIQUE: Multidetector CT imaging of the abdomen and pelvis was performed following the standard protocol without IV contrast. RADIATION DOSE REDUCTION: This exam was performed according to the departmental dose-optimization program which includes automated exposure control, adjustment of the mA and/or kV according to patient size and/or use of  iterative reconstruction technique. COMPARISON:  CT chest abdomen and pelvis 05/07/2024. CT abdomen and pelvis 03/29/2022. FINDINGS: Lower chest: There is Terry trace right pleural effusion. Hepatobiliary: Patient is status post cholecystectomy. Pneumobilia is again seen. There are 5 new rounded hypodensities in the right lobe of the liver which are too small to characterize, indeterminate. Pancreas: Unremarkable. No pancreatic ductal dilatation or surrounding inflammatory changes. Spleen: Normal in size without focal abnormality. Adrenals/Urinary Tract: There is mild bilateral perinephric fat stranding, mildly increased from prior. No urinary tract calculus or hydronephrosis. The adrenal glands and bladder are within normal limits. Stomach/Bowel: Stomach is within normal limits. Appendix appears normal. No evidence of bowel wall thickening, distention, or inflammatory changes. Vascular/Lymphatic: No significant vascular findings are present. No enlarged abdominal or pelvic lymph nodes. Reproductive: Uterus and bilateral adnexa are unremarkable. Other: There is Terry small amount of free fluid in the pelvis. There is no focal abdominal wall hernia. Musculoskeletal: No acute or significant osseous findings. IMPRESSION: 1. Mild bilateral perinephric fat stranding, mildly increased from prior. Correlate clinically for pyelonephritis. 2. Small amount of free fluid in the pelvis. 3. Trace right pleural effusion. 4. New subcentimeter hypodensities in the liver are too small to characterize. Given that these were not definitely seen on 05/07/2024, differential diagnosis includes infectious etiologies. Electronically Signed   By: Greig Pique M.D.   On: 05/24/2024 00:29    Labs:  CBC: Recent Labs    05/24/24 0505 05/25/24 0318 05/26/24 0327 05/27/24 0543  WBC 8.9 13.2* 13.0* 14.8*  HGB 9.7* 10.0* 9.4* 9.9*  HCT 30.1* 32.0* 28.5* 32.3*  PLT 18* 37* 49* 80*    COAGS: Recent Labs    05/27/24 0543  INR 1.0     BMP: Recent Labs    05/23/24 0513 05/25/24 0318 05/26/24 0327 05/27/24 0543  NA 137 137 140 139  K 3.6 3.7 3.5 3.6  CL 104 109 108 104  CO2 21* 20* 21* 25  GLUCOSE 88 179* 170* 90  BUN 24* 21* 16 12  CALCIUM 8.0* 8.0* 7.8* 7.9*  CREATININE 0.77 0.53 0.53 0.47  GFRNONAA >60 >60 >60 >60    LIVER FUNCTION TESTS: Recent Labs    05/22/24 0700 05/25/24 0318 05/26/24 0327 05/27/24 0543  BILITOT 0.9 0.6 0.7 1.0  AST 11* <10* 10* 14*  ALT 24 21 20 22   ALKPHOS 144* 82 78 82  PROT 4.2* 6.0* 5.5* 5.6*  ALBUMIN 2.3* 2.0* 2.0* 2.0*    Assessment and Plan: 58 yo female with hx esophageal spasm, aortic aneurysm, bicuspid aortic valve, GCA, PMR admitted with acute thrombocytopenia, lethargy, occ HA's ,hypotension, abd pain; found to have klebsiella/enterobacter bacteremia and liver lesions concerning for small abscesses; request now received for image guided liver lesion(s) aspiration. She is afebrile, WBC  14.8, hgb 9.9, plts 80k, PT/INR nl, creat nl, lactic acid 2.4; currently on flagyl , rocephin . Pt known to IR team from perihepatic fluid collection aspiration 2017, thoracentesis 2023, RP abscess drain 2023. Imaging studies were reviewed by Dr. Philip. Risks and benefits of procedure was discussed with the patient  including, but not limited to  bleeding, infection, damage to adjacent structures or low yield requiring additional tests.  All of the questions were answered and there is agreement to proceed.  Consent signed and in chart.  Procedure tent scheduled for later today  Electronically Signed: D. Franky Rakers, PA-C 05/27/2024, 9:55 AM   I spent Terry total of 25 Minutes at the the patient's bedside AND on the patient's hospital floor or unit, greater than 50% of which was counseling/coordinating care for image guided liver lesion(s) aspiration    Patient ID: Laurie Terry, female   DOB: 04/20/1966, 58 y.o.   MRN: [id number]

## 2024-05-27 NOTE — Progress Notes (Signed)
 Cardiology Progress Note  Patient ID: ARALYN NOWAK MRN: [id number] DOB: [birth year] Date of Encounter: 05/27/2024 Primary Cardiologist: Stanly DELENA Leavens, MD  Subjective   Chief Complaint: Pelvic pain  HPI: Describes pelvic pain and liver abscess drainage procedure today.  Heart rate up in the 90s.  Describes pain in her pelvic area.    ROS:  All other ROS reviewed and negative. Pertinent positives noted in the HPI.     Telemetry  Overnight telemetry shows sinus rhythm 40 to 90 bpm, which I personally reviewed.     Physical Exam   Vitals:   05/26/24 1918 05/26/24 2335 05/27/24 0400 05/27/24 0746  BP: 105/74 114/75 122/70 117/73  Pulse: (!) 56 (!) 44 (!) 45 (!) 41  Resp: 14 14 14 18   Temp: 99.1 F (37.3 C) 98.3 F (36.8 C) 98.5 F (36.9 C) 98.5 F (36.9 C)  TempSrc: Oral Oral Oral Oral  SpO2: 97% 97% 96% 97%  Weight:   78.6 kg   Height:        Intake/Output Summary (Last 24 hours) at 05/27/2024 1101 Last data filed at 05/27/2024 0500 Gross per 24 hour  Intake 540 ml  Output --  Net 540 ml       05/27/2024    4:00 AM 05/24/2024    8:10 PM 05/22/2024   10:39 AM  Last 3 Weights  Weight (lbs) 173 lb 4.5 oz 175 lb 4.3 oz 151 lb 14.4 oz  Weight (kg) 78.6 kg 79.5 kg 68.9 kg    Body mass index is 28.84 kg/m.  General: Well nourished, well developed, in no acute distress Head: Atraumatic, normal size  Eyes: PEERLA, EOMI  Neck: Supple, no JVD Endocrine: No thryomegaly Cardiac: Normal S1, S2; RRR; no murmurs, rubs, or gallops Lungs: Clear to auscultation bilaterally, no wheezing, rhonchi or rales  Abd: Soft, nontender, no hepatomegaly  Ext: No edema, pulses 2+ Musculoskeletal: No deformities, BUE and BLE strength normal and equal Skin: Warm and dry, no rashes   Neuro: Alert and oriented to person, place, time, and situation, CNII-XII grossly intact, no focal deficits  Psych: Normal mood and affect   Cardiac Studies  TTE 05/25/2024  1. Left ventricular ejection  fraction, by estimation, is 55 to 60%. The  left ventricle has normal function. The left ventricle has no regional  wall motion abnormalities. Left ventricular diastolic parameters are  indeterminate.   2. Right ventricular systolic function is normal. The right ventricular  size is normal.   3. The mitral valve is normal in structure. No evidence of mitral valve  regurgitation. No evidence of mitral stenosis.   4. The aortic valve is normal in structure. Aortic valve regurgitation is  not visualized. No aortic stenosis is present.   5. There is borderline dilatation of the ascending aorta, measuring 38  mm.   6. The inferior vena cava is normal in size with greater than 50%  respiratory variability, suggesting right atrial pressure of 3 mmHg.   Patient Profile  58 year old female with history of polymyalgia rheumatica, giant cell arteritis admitted on 05/23/2024 for sepsis/pneumonia and Klebsiella bacteremia. Cardiology consulted for sinus bradycardia. Course also complicated by acute thrombocytopenia and lactic acidosis.   Assessment & Plan   # Sinus bradycardia # Isorhythmic dissociation - Currently admitted with sepsis and Klebsiella bacteremia.  Concerns for liver abscesses and osteitis pubis.  Also had lactic acidosis.  She has had sinus bradycardia with isorhythmic dissociation.  Narrow intervals.  No indication for pacing.  I  suspect her bradycardia was all related to infection it is actually improving.  Heart rate is in the 90s today.  No further evaluation or workup is needed per cardiology her echo was normal.  Her heart rate is improving.  This is all sepsis driven.  Cardiology to follow along as needed.  # Klebsiella bacteremia # Liver abscesses # Pubic bone osteomyelitis # PMR  # Giant cell arteritis # Thrombocytopenia - Per primary team.  [facility] will sign off.   The patient is ready for discharge today from a cardiac standpoint. Medication  Recommendations:  As above Other recommendations (labs, testing, etc):  none Follow up as an outpatient: She may keep regular follow-up with Dr. Nena Colt.  No urgent follow-up is needed in cardiology.  For questions or updates, please contact [facility] Please consult [email or web address] for contact info under       Signed, Darryle T. Barbaraann, MD, [facility]  [facility]  05/27/2024 11:01 AM

## 2024-05-27 NOTE — Procedures (Signed)
 Interventional Radiology Procedure:   Indications: Small hepatic abscesses  Procedure: US  guided aspiration of hepatic abscess  Findings: Aspirated 0.5 ml of bloody fluid from a small right hepatic fluid collection.   Complications: None     EBL: Minimal  Plan: Bedrest 2 hours.   Fluid sent for cultures.  Jani Ploeger R. Philip, MD  Pager: [phone]

## 2024-05-27 NOTE — Progress Notes (Signed)
 Laurie Terry   DOB:[birth year]   FM#:[id number]    ASSESSMENT & PLAN:  Acute thrombocytopenia She developed acute thrombocytopenia in the setting of recent treatment with prednisone  for giant cell arteritis, prescribed by her rheumatologist.  I reviewed all her labs extensively.  She has positive anti-CCP antibody and strong family history of rheumatoid arthritis in her father She has received 2 doses of IVIG Blood culture came back positive I suspect her acute thrombocytopenia is due to infection She received 1 dose of Nplate  on May 24, 2024 Platelet count is trending up  Giant cell arteritis/autoimmune disorder She was started on steroids/prednisone  by her rheumatologist Ultrasound of the temporal artery is not diagnostic of giant cell arteritis  I recommend prednisone  taper if indicated  Bacteremia with liver abscess Appreciate ID consult She is responding to IV antibiotics  Severe bradycardia Cause is unknown Cardiology workup in progress  Discharge planning Unknown Case is fully discussed with primary service and the patient With improvement of her platelet count, I will sign off   Laurie Bedford, MD 05/27/2024 11:38 AM  Subjective:  She was last seen on Tuesday.  She underwent multiple imaging studies which showed liver abscesses.  Ultrasound-guided biopsy/aspiration is pending She has been afebrile  Objective:  Vitals:   05/27/24 0400 05/27/24 0746  BP: 122/70 117/73  Pulse: (!) 45 (!) 41  Resp: 14 18  Temp: 98.5 F (36.9 C) 98.5 F (36.9 C)  SpO2: 96% 97%     Intake/Output Summary (Last 24 hours) at 05/27/2024 1138 Last data filed at 05/27/2024 0500 Gross per 24 hour  Intake 300 ml  Output --  Net 300 ml

## 2024-05-27 NOTE — Progress Notes (Signed)
 Progress Note    Laurie Terry   FMW:[id number]  DOB: 1966-04-04  DOA: 05/21/2024     5 PCP: [name], MD  Initial CC: lethargy  Hospital Course: 57 yof w/ history significant for esophageal spasm, aortic aneurysm, bicuspid aortic valve recently diagnosed recently with giant cell arteritis as well as PMR on high-dose steroid being admitted to the hospital with acute thrombocytopenia.   Recently diagnosed with GCA and PMR a month ago, saw a rheumatologist and has been on high-dose steroids. Her PMR symptoms (joint pains) resolved very rapidly, however she has continued to have low-grade headaches and vision changes. Over the last few days, she was having fever, severe lethargy, and relative hypotension with no focal symptoms, or symptoms of acute infection, no bruising or bleeding. She went to her PCP for evaluation and labs showed with platelet count 30K and referred to the ED  Found to have sepsis from Klebsiella bacteremia.  Patient was also found to have small abscesses on her liver (similar presentation back in 2023/2017?)  Assessment and plan:  Acute Thrombocytopenia Recent clinical diagnosis GCA Recent diagnosis PMR - Found to have abnormal labs and routine PCP workup for low-grade fever fatigue-in the setting of high-dose steroid for recently diagnosed GCA/PMR/autoimmune disorder. - GCA diagnosis appears to be a clinical diagnosis outpatient.  No temporal artery biopsy performed-it appears a vascular ultrasound of the temporal arteries was ordered this weekend and negative - with further collateral info: known history of ocular migraines with aura's and recent headaches have been similar with aura (but slightly different) however she never had decreased vision or vision loss. She has had photosensitivity with this prolonged episode and symptoms since ~June (?prolonged complicated migraine? Instead of diagnosis of GCA especially without biopsy or other modalities. She does  state inflammatory marker was elevated at rheumatology evaluation and with presentation she was then diagnosed with GCA) - Need to ensure outpatient follow-up with rheumatology (Dr. Jon Jacob)- discussed with Dr. Jacob- that even if biopsy done and if negative would still treat.  Can try to wean steroids but if worsening headaches will need to increase/slow - Seen by hematology  - Platelets slowly improving with treatment of infection - Continue to monitor  Osteitis pubis -on steroids -trial of voltaren  gel -NSAIDs if ale to tolerate  Abnormal liver MRI - IR consulted for possible drain -Very similar situation in May 2023  Klebsiella bacteremia  Severe sepsis-resolved - No obvious urinary symptoms although does report some very mild dysuria prior to admission and even some suprapubic discomfort; on exam she also has right CVA tenderness.  CT A/P also showed bilateral perinephric fat stranding concerning for pyelonephritis.  Outpatient last Friday she had urine culture performed which showed 50-100 K mixed urogenital flora.  Most likely source seems to be urinary at this time.   - Antibiotics per ID   Sinus bradycardia.  Heart rate recorded in high 20s to low 30s. Cardiology was consulted, remained asymptomatic. Per cardiology most likely due to metabolic derangements secondary to sepsis.   Abdominal pain and diarrhea.  Patient with worsening abdominal pain, had 6 episodes of watery diarrhea yesterday, just 1 since this morning, abdominal pain worsening.  No nausea or vomiting.  Patient is on antibiotics. -Less likely C. difficile but patient is high risk -Monitor  Low TSH.  Patient was found to have mildly low TSH and free T4, likely secondary to sepsis. - Need repeat levels in 4 to 6 weeks by PCP  Normocytic anemia -  No obvious bleeding but with thrombocytopenia, hemoglobin has downtrended some since admission - Trend  Prediabetes - A1c 6.4%.  Hyperglycemia likely in  setting of steroid use - Controlled for now, but if up trends further will need to start on sliding scale  Mild metabolic acidosis - Continue to trend lactic acid  Interval History:  Unable to walk due to pain at pubic symphysis     DVT prophylaxis:  Place TED hose Start: 05/26/24 1022 Place and maintain sequential compression device Start: 05/23/24 0944   Code Status:   Code Status: Full Code     Objective: Blood pressure 117/73, pulse (!) 41, temperature 98.5 F (36.9 C), temperature source Oral, resp. rate 18, height 5' 5 (1.651 m), weight 78.6 kg, SpO2 97%.     General: Appearance:     Overweight female in no acute distress     Lungs:      respirations unlabored  Heart:    Bradycardic.   MS:   All extremities are intact.   Neurologic:   Awake, alert, oriented x 3. No apparent focal neurological           defect.    Consultants:  Hematology ID cardiology    Data Reviewed: Results for orders placed or performed during the hospital encounter of 05/21/24 (from the past 24 hours)  CBC with Differential/Platelet     Status: Abnormal   Collection Time: 05/27/24  5:43 AM  Result Value Ref Range   WBC 14.8 (H) 4.0 - 10.5 K/uL   RBC 3.51 (L) 3.87 - 5.11 MIL/uL   Hemoglobin 9.9 (L) 12.0 - 15.0 g/dL   HCT 67.6 (L) 63.9 - 53.9 %   MCV 92.0 80.0 - 100.0 fL   MCH 28.2 26.0 - 34.0 pg   MCHC 30.7 30.0 - 36.0 g/dL   RDW 83.1 (H) 88.4 - 84.4 %   Platelets 80 (L) 150 - 400 K/uL   nRBC 0.9 (H) 0.0 - 0.2 %   Neutrophils Relative % 78 %   Neutro Abs 11.6 (H) 1.7 - 7.7 K/uL   Lymphocytes Relative 8 %   Lymphs Abs 1.1 0.7 - 4.0 K/uL   Monocytes Relative 3 %   Monocytes Absolute 0.4 0.1 - 1.0 K/uL   Eosinophils Relative 0 %   Eosinophils Absolute 0.0 0.0 - 0.5 K/uL   Basophils Relative 1 %   Basophils Absolute 0.1 0.0 - 0.1 K/uL   WBC Morphology See Note    RBC Morphology MORPHOLOGY UNREMARKABLE    Smear Review Normal platelet morphology    Immature Granulocytes 10 %    Abs Immature Granulocytes 1.54 (H) 0.00 - 0.07 K/uL  Magnesium      Status: None   Collection Time: 05/27/24  5:43 AM  Result Value Ref Range   Magnesium  2.4 1.7 - 2.4 mg/dL  Comprehensive metabolic panel with GFR     Status: Abnormal   Collection Time: 05/27/24  5:43 AM  Result Value Ref Range   Sodium 139 135 - 145 mmol/L   Potassium 3.6 3.5 - 5.1 mmol/L   Chloride 104 98 - 111 mmol/L   CO2 25 22 - 32 mmol/L   Glucose, Bld 90 70 - 99 mg/dL   BUN 12 6 - 20 mg/dL   Creatinine, Ser 9.52 0.44 - 1.00 mg/dL   Calcium 7.9 (L) 8.9 - 10.3 mg/dL   Total Protein 5.6 (L) 6.5 - 8.1 g/dL   Albumin 2.0 (L) 3.5 - 5.0 g/dL   AST 14 (  L) 15 - 41 U/L   ALT 22 0 - 44 U/L   Alkaline Phosphatase 82 38 - 126 U/L   Total Bilirubin 1.0 0.0 - 1.2 mg/dL   GFR, Estimated >39 >39 mL/min   Anion gap 10 5 - 15  Lactic acid, plasma     Status: Abnormal   Collection Time: 05/27/24  5:43 AM  Result Value Ref Range   Lactic Acid, Venous 2.4 (HH) 0.5 - 1.9 mmol/L  Protime-INR     Status: None   Collection Time: 05/27/24  5:43 AM  Result Value Ref Range   Prothrombin Time 14.2 11.4 - 15.2 seconds   INR 1.0 0.8 - 1.2    I have reviewed pertinent nursing notes, vitals, labs, and images as necessary. I have ordered labwork to follow up on as indicated.  I have reviewed the last notes from staff over past 24 hours. I have discussed patient's care plan and test results with nursing staff, CM/SW, and other staff as appropriate.  Time spent: Greater than 50% of the 50 minute visit was spent in counseling/coordination of care for the patient as laid out in the A&P.   LOS: 5 days   Eman Rynders U Leshon Armistead, DO [facility] 05/27/2024, 12:40 PM

## 2024-05-27 NOTE — Plan of Care (Signed)
  Problem: Health Behavior/Discharge Planning: Goal: Ability to manage health-related needs will improve Outcome: Progressing   Problem: Clinical Measurements: Goal: Diagnostic test results will improve Outcome: Progressing   Problem: Nutrition: Goal: Adequate nutrition will be maintained Outcome: Progressing

## 2024-05-27 NOTE — Plan of Care (Signed)

## 2024-05-28 DIAGNOSIS — D696 Thrombocytopenia, unspecified: Secondary | ICD-10-CM | POA: Diagnosis not present

## 2024-05-28 DIAGNOSIS — M315 Giant cell arteritis with polymyalgia rheumatica: Secondary | ICD-10-CM | POA: Diagnosis not present

## 2024-05-28 DIAGNOSIS — R300 Dysuria: Secondary | ICD-10-CM | POA: Diagnosis not present

## 2024-05-28 DIAGNOSIS — B961 Klebsiella pneumoniae [K. pneumoniae] as the cause of diseases classified elsewhere: Secondary | ICD-10-CM | POA: Diagnosis not present

## 2024-05-28 DIAGNOSIS — R102 Pelvic and perineal pain: Secondary | ICD-10-CM

## 2024-05-28 DIAGNOSIS — M869 Osteomyelitis, unspecified: Secondary | ICD-10-CM

## 2024-05-28 DIAGNOSIS — K750 Abscess of liver: Secondary | ICD-10-CM | POA: Diagnosis not present

## 2024-05-28 DIAGNOSIS — M609 Myositis, unspecified: Secondary | ICD-10-CM

## 2024-05-28 LAB — COMPREHENSIVE METABOLIC PANEL WITH GFR
ALT: 26 U/L (ref 0–44)
AST: 18 U/L (ref 15–41)
Albumin: 2.2 g/dL — ABNORMAL LOW (ref 3.5–5.0)
Alkaline Phosphatase: 100 U/L (ref 38–126)
Anion gap: 11 (ref 5–15)
BUN: 10 mg/dL (ref 6–20)
CO2: 26 mmol/L (ref 22–32)
Calcium: 7.7 mg/dL — ABNORMAL LOW (ref 8.9–10.3)
Chloride: 99 mmol/L (ref 98–111)
Creatinine, Ser: 0.53 mg/dL (ref 0.44–1.00)
GFR, Estimated: >60 mL/min (ref >60)
Glucose, Bld: 89 mg/dL (ref 70–99)
Potassium: 3.3 mmol/L — ABNORMAL LOW (ref 3.5–5.1)
Sodium: 135 mmol/L (ref 135–145)
Total Bilirubin: 1.1 mg/dL (ref 0.0–1.2)
Total Protein: 5.8 g/dL — ABNORMAL LOW (ref 6.5–8.1)

## 2024-05-28 LAB — CBC WITH DIFFERENTIAL/PLATELET
Abs Immature Granulocytes: 1.61 K/uL — ABNORMAL HIGH (ref 0.00–0.07)
Basophils Absolute: 0.1 K/uL (ref 0.0–0.1)
Basophils Relative: 1 %
Eosinophils Absolute: 0 K/uL (ref 0.0–0.5)
Eosinophils Relative: 0 %
HCT: 33 % — ABNORMAL LOW (ref 36.0–46.0)
Hemoglobin: 10.5 g/dL — ABNORMAL LOW (ref 12.0–15.0)
Immature Granulocytes: 11 %
Lymphocytes Relative: 9 %
Lymphs Abs: 1.3 K/uL (ref 0.7–4.0)
MCH: 28.5 pg (ref 26.0–34.0)
MCHC: 31.8 g/dL (ref 30.0–36.0)
MCV: 89.4 fL (ref 80.0–100.0)
Monocytes Absolute: 0.5 K/uL (ref 0.1–1.0)
Monocytes Relative: 3 %
Neutro Abs: 11.3 K/uL — ABNORMAL HIGH (ref 1.7–7.7)
Neutrophils Relative %: 76 %
Platelets: 134 K/uL — ABNORMAL LOW (ref 150–400)
RBC: 3.69 MIL/uL — ABNORMAL LOW (ref 3.87–5.11)
RDW: 16.2 % — ABNORMAL HIGH (ref 11.5–15.5)
Smear Review: NORMAL
WBC: 14.8 K/uL — ABNORMAL HIGH (ref 4.0–10.5)
nRBC: 0.9 % — ABNORMAL HIGH (ref 0.0–0.2)

## 2024-05-28 LAB — LACTIC ACID, PLASMA: Lactic Acid, Venous: 1.7 mmol/L (ref 0.5–1.9)

## 2024-05-28 LAB — CK: Total CK: 31 U/L — ABNORMAL LOW (ref 38–234)

## 2024-05-28 LAB — MAGNESIUM: Magnesium: 2.2 mg/dL (ref 1.7–2.4)

## 2024-05-28 MED ORDER — POTASSIUM CHLORIDE CRYS ER 20 MEQ PO TBCR
40.0000 meq | EXTENDED_RELEASE_TABLET | Freq: Once | ORAL | Status: AC
Start: 2024-05-28 — End: 2024-05-28
  Administered 2024-05-28: 40 meq via ORAL
  Filled 2024-05-28: qty 2

## 2024-05-28 MED ORDER — KETOROLAC TROMETHAMINE 15 MG/ML IJ SOLN
15.0000 mg | Freq: Four times a day (QID) | INTRAMUSCULAR | Status: DC | PRN
  Administered 2024-05-28: 15 mg via INTRAVENOUS
  Filled 2024-05-28: qty 1

## 2024-05-28 MED ORDER — DAPTOMYCIN-SODIUM CHLORIDE 700-0.9 MG/100ML-% IV SOLN
700.0000 mg | Freq: Every day | INTRAVENOUS | Status: DC
  Administered 2024-05-28 – 2024-05-29 (×2): 700 mg via INTRAVENOUS
  Filled 2024-05-28 (×3): qty 100

## 2024-05-28 NOTE — H&P (View-Only) (Signed)
 Referring Provider: Dr. Juvenal Primary Care Physician:  [name], MD Primary Gastroenterologist:  Dr. Rosalie  Reason for Consultation:  Klebsiella bacteremia; Liver abscesses  HPI: Laurie Terry is a 58 y.o. female with a history of choledocholithiasis and pancreatitis who had an ERCP in 2023 for choledocholithiasis and developed a small retroperitoneal perforation post-procedure that was drained and also an ERCP in 2017 for postop bile leak who was admitted with severe thrombocytopenia and found to have Klebsiella bacteremia and MRCP showed multiple liver abscesses. Pneumobilia. No biliary dilation on MRCP. S/P liver biopsy of one of the abscesses yesterday. Has been having pelvic pain but denies RUQ or epigastric pain. Fevers and malaise at home. LFTs normal.   Past Medical History:  Diagnosis Date   Allergic rhinitis    Aortic aneurysm (HCC) 09/23/2020   Cancer (HCC)    Melanoma   Cervical dysplasia 2007   low-grade SIL, normal Pap smears since then   Family history of adverse reaction to anesthesia    mom has h/o post op nausea/vomiting   Intermittent palpitations    IUD    inserted 03/2002.04/06/2007, 06/03/2012   Ocular migraine    Osteomalacia    Pancreatitis    Seafood allergy     Past Surgical History:  Procedure Laterality Date   BIOPSY  01/21/2022   Procedure: BIOPSY;  Surgeon: Dianna Specking, MD;  Location: [facility];  Service: Gastroenterology;;   ROMAYNE  2018   CHOLECYSTECTOMY N/A 04/09/2016   Procedure: LAPAROSCOPIC CHOLECYSTECTOMY ;  Surgeon: Donnice Bury, MD;  Location: [facility] OR;  Service: General;  Laterality: N/A;   ERCP  04/11/2016   ERCP with pancreatic stent and failed CBD stent after needle knife and conventional sphincterotomy   ERCP N/A 04/12/2016   Procedure: ENDOSCOPIC RETROGRADE CHOLANGIOPANCREATOGRAPHY (ERCP);  Surgeon: Oliva Rosalie, MD;  Location: [facility] END[facility]COPY;  Service: Endoscopy;  Laterality: N/A;   ERCP N/A 01/15/2022   Procedure:  ENDOSCOPIC RETROGRADE CHOLANGIOPANCREATOGRAPHY (ERCP);  Surgeon: Rosalie Oliva, MD;  Location: [facility];  Service: Gastroenterology;  Laterality: N/A;   FLEXIBLE SIGMOIDOSCOPY N/A 01/21/2022   Procedure: FLEXIBLE SIGMOIDOSCOPY;  Surgeon: Dianna Specking, MD;  Location: [facility];  Service: Gastroenterology;  Laterality: N/A;   FOOT SURGERY     toenail   INTRAUTERINE DEVICE INSERTION  08/07/2017   Mirena    IR EXCHANGE BILIARY DRAIN  02/11/2022   IR RADIOLOGIST EVAL & MGMT  02/08/2022   IR RADIOLOGIST EVAL & MGMT  02/20/2022   IR RADIOLOGIST EVAL & MGMT  02/28/2022   removal of melanoma     REMOVAL OF STONES  01/15/2022   Procedure: REMOVAL OF STONES;  Surgeon: Rosalie Oliva, MD;  Location: [facility];  Service: Gastroenterology;;   ANNETT  01/15/2022   Procedure: ANNETT;  Surgeon: Rosalie Oliva, MD;  Location: [facility];  Service: Gastroenterology;;    Prior to Admission medications   Medication Sig Start Date End Date Taking? Authorizing Provider  acetaminophen  (TYLENOL ) 500 MG tablet Take 500 mg by mouth every 6 (six) hours as needed.   Yes [provider]  Adapalene 0.3 % gel Apply 1 application  topically daily as needed. 04/29/22  Yes [provider]  alum & mag hydroxide-simeth (MAALOX PLUS) 400-400-40 MG/5ML suspension Take 15 mLs by mouth every 6 (six) hours as needed for indigestion. 05/07/24  Yes [name], MD  atovaquone  (MEPRON ) 750 MG/5ML suspension Take 750 mg by mouth 2 (two) times daily.   Yes [provider]  dicyclomine  (BENTYL ) 20 MG tablet  Take 1 tablet (20 mg total) by mouth 2 (two) times daily. Patient taking differently: Take 20 mg by mouth 2 (two) times daily as needed. 05/07/24  Yes [name], MD  EPINEPHrine  0.3 mg/0.3 mL IJ SOAJ injection Inject 0.3 mg into the muscle as needed for anaphylaxis. 10/08/22  Yes Tobie Arleta SQUIBB, MD  famotidine  (PEPCID ) 20 MG tablet Take 20 mg by mouth daily as needed for heartburn  or indigestion.   Yes [provider]  fluticasone  (FLONASE ) 50 MCG/ACT nasal spray Place 2 sprays into both nostrils daily. Patient taking differently: Place 2 sprays into both nostrils daily as needed. 10/08/22  Yes Tobie Arleta SQUIBB, MD  metoprolol  tartrate (LOPRESSOR ) 25 MG tablet Take 1 tablet (25 mg total) by mouth daily as needed (palpitations). 01/27/24  Yes Chandrasekhar, Mahesh A, MD  predniSONE  (DELTASONE ) 20 MG tablet Take 30 mg by mouth 2 (two) times daily.   Yes [provider]  estradiol  (ESTRACE ) 0.1 MG/GM vaginal cream Place 0.5 g vaginally 2 (two) times a week. Place 0.5g nightly for two weeks then twice a week after Patient not taking: Reported on 05/21/2024 01/08/24   Zuleta, Kaitlin G, NP  nitrofurantoin , macrocrystal-monohydrate, (MACROBID ) 100 MG capsule Take 1 capsule (100 mg total) by mouth 2 (two) times daily. Patient not taking: Reported on 04/06/2024 02/26/24   Zuleta, Kaitlin G, NP  oxyCODONE -acetaminophen  (PERCOCET/ROXICET) 5-325 MG tablet Take 1 tablet by mouth every 6 (six) hours as needed for severe pain (pain score 7-10). Patient not taking: Reported on 05/21/2024 05/07/24   [name], MD    Scheduled Meds:  atovaquone   750 mg Oral BID   diclofenac  Sodium  2 g Topical QID   famotidine   40 mg Oral Daily   metroNIDAZOLE   500 mg Oral Q12H   predniSONE   50 mg Oral Q breakfast   saccharomyces boulardii  250 mg Oral BID   Continuous Infusions:  cefTRIAXone  (ROCEPHIN )  IV 2 g (05/28/24 1024)   DAPTOmycin      PRN Meds:.acetaminophen , albuterol , bisacodyl , diphenhydrAMINE  **AND** metoCLOPramide  (REGLAN ) injection, loperamide , morphine  injection, ondansetron  **OR** ondansetron  (ZOFRAN ) IV, mouth rinse, oxyCODONE , polyethylene glycol, traZODone   Allergies as of 05/21/2024 - Review Complete 05/21/2024  Allergen Reaction Noted   Contrast media [iodinated contrast media] Shortness Of Breath 04/12/2016   Iodine Shortness Of Breath and Swelling 03/06/2011    Other Hives and Swelling 10/17/2013   Augmentin  [amoxicillin -pot clavulanate] Nausea And Vomiting 12/18/2022   Avocado Itching 09/28/2020   Levaquin [levofloxacin]  03/07/2022   Levofloxacin Hives 03/06/2011   Pantoprazole  Other (See Comments) 05/17/2021   Vancomycin  Cough 01/24/2022   Colestipol  hcl Rash 01/21/2022    Family History  Problem Relation Age of Onset   Tremor Mother    Stroke Father    Autoimmune disease Father    CVA Father    Breast cancer Sister 62       3X   Arthritis Sister        Psoriatic   Thyroid  disease Sister    Breast cancer Maternal Grandmother        Age 32   Thyroid  disease Daughter        Hypo   Uterine cancer Neg Hx    Bladder Cancer Neg Hx    Renal cancer Neg Hx     Social History   Socioeconomic History   Marital status: Married    Spouse name: Marcey Blumenthal   Number of children: 4   Years of education: Not on file  Highest education level: Not on file  Occupational History   Not on file  Tobacco Use   Smoking status: Former    Passive exposure: Past   Smokeless tobacco: Never  Vaping Use   Vaping status: Never Used  Substance and Sexual Activity   Alcohol use: Yes   Drug use: No   Sexual activity: Yes    Partners: Male    Birth control/protection: Post-menopausal    Comment: des neg  Other Topics Concern   Not on file  Social History Narrative   Not on file   Social Drivers of Health   Financial Resource Strain: Not on file  Food Insecurity: No Food Insecurity (05/22/2024)   Hunger Vital Sign    Worried About Running Out of Food in the Last Year: Never true    Ran Out of Food in the Last Year: Never true  Transportation Needs: No Transportation Needs (05/22/2024)   PRAPARE - Administrator, Civil Service (Medical): No    Lack of Transportation (Non-Medical): No  Physical Activity: Not on file  Stress: Not on file  Social Connections: Moderately Integrated (05/22/2024)   Social Connection and Isolation  Panel    Frequency of Communication with Friends and Family: More than three times a week    Frequency of Social Gatherings with Friends and Family: More than three times a week    Attends Religious Services: Never    Database administrator or Organizations: Yes    Attends Engineer, structural: More than 4 times per year    Marital Status: Married  Catering manager Violence: Not At Risk (05/22/2024)   Humiliation, Afraid, Rape, and Kick questionnaire    Fear of Current or Ex-Partner: No    Emotionally Abused: No    Physically Abused: No    Sexually Abused: No    Review of Systems: All negative except as stated above in HPI.  Physical Exam: Vital signs: Vitals:   05/27/24 2123 05/28/24 0649  BP: (!) 95/59 105/66  Pulse: 86 89  Resp: 18 18  Temp: 99.3 F (37.4 C) 99.5 F (37.5 C)  SpO2: 95% 94%   Last BM Date : 05/25/24 General:  Lethargic, Well-developed, well-nourished, pleasant and cooperative in NAD Head: normocephalic, atraumatic Eyes: anicteric sclera ENT: oropharynx clear Neck: supple, nontender Lungs:  Clear throughout to auscultation.   No wheezes, crackles, or rhonchi. No acute distress. Heart:  Regular rate and rhythm; no murmurs, clicks, rubs,  or gallops. Abdomen: diffuse tenderness (greatest in suprapubic) with guarding, soft, nondistended, +BS  Rectal:  Deferred Ext: no edema  GI:  Lab Results: Recent Labs    05/26/24 0327 05/27/24 0543 05/28/24 0501  WBC 13.0* 14.8* 14.8*  HGB 9.4* 9.9* 10.5*  HCT 28.5* 32.3* 33.0*  PLT 49* 80* 134*   BMET Recent Labs    05/26/24 0327 05/27/24 0543 05/28/24 0501  NA 140 139 135  K 3.5 3.6 3.3*  CL 108 104 99  CO2 21* 25 26  GLUCOSE 170* 90 89  BUN 16 12 10   CREATININE 0.53 0.47 0.53  CALCIUM 7.8* 7.9* 7.7*   LFT Recent Labs    05/28/24 0501  PROT 5.8*  ALBUMIN 2.2*  AST 18  ALT 26  ALKPHOS 100  BILITOT 1.1   PT/INR Recent Labs    05/27/24 0543  LABPROT 14.2  INR 1.0      Studies/Results:  Impression/Plan: Klebsiella bacteremia and liver abscesses of unclear etiology on broad spectrum antibiotics  followed by ID. Normal LFTs and no biliary dilation on MRCP. Previous sphincterotomy with pneumobilia. Patient very concerned about another ERCP due to previous complications from it. No urgent need for ERCP but if infection does not resolve then may need an ERCP in near future. Dr. Rosalie will reevaluate tomorrow. Supportive care.    LOS: 6 days   Jerrell JAYSON Sol  05/28/2024, 1:59 PM  Questions please call [phone]

## 2024-05-28 NOTE — Consult Note (Signed)
 Referring Provider: Dr. Juvenal Primary Care Physician:  [name], MD Primary Gastroenterologist:  Dr. Rosalie  Reason for Consultation:  Klebsiella bacteremia; Liver abscesses  HPI: Laurie Terry is a 58 y.o. female with a history of choledocholithiasis and pancreatitis who had an ERCP in 2023 for choledocholithiasis and developed a small retroperitoneal perforation post-procedure that was drained and also an ERCP in 2017 for postop bile leak who was admitted with severe thrombocytopenia and found to have Klebsiella bacteremia and MRCP showed multiple liver abscesses. Pneumobilia. No biliary dilation on MRCP. S/P liver biopsy of one of the abscesses yesterday. Has been having pelvic pain but denies RUQ or epigastric pain. Fevers and malaise at home. LFTs normal.   Past Medical History:  Diagnosis Date   Allergic rhinitis    Aortic aneurysm (HCC) 09/23/2020   Cancer (HCC)    Melanoma   Cervical dysplasia 2007   low-grade SIL, normal Pap smears since then   Family history of adverse reaction to anesthesia    mom has h/o post op nausea/vomiting   Intermittent palpitations    IUD    inserted 03/2002.04/06/2007, 06/03/2012   Ocular migraine    Osteomalacia    Pancreatitis    Seafood allergy     Past Surgical History:  Procedure Laterality Date   BIOPSY  01/21/2022   Procedure: BIOPSY;  Surgeon: Dianna Specking, MD;  Location: [facility];  Service: Gastroenterology;;   ROMAYNE  2018   CHOLECYSTECTOMY N/A 04/09/2016   Procedure: LAPAROSCOPIC CHOLECYSTECTOMY ;  Surgeon: Donnice Bury, MD;  Location: [facility] OR;  Service: General;  Laterality: N/A;   ERCP  04/11/2016   ERCP with pancreatic stent and failed CBD stent after needle knife and conventional sphincterotomy   ERCP N/A 04/12/2016   Procedure: ENDOSCOPIC RETROGRADE CHOLANGIOPANCREATOGRAPHY (ERCP);  Surgeon: Oliva Rosalie, MD;  Location: [facility] END[facility]COPY;  Service: Endoscopy;  Laterality: N/A;   ERCP N/A 01/15/2022   Procedure:  ENDOSCOPIC RETROGRADE CHOLANGIOPANCREATOGRAPHY (ERCP);  Surgeon: Rosalie Oliva, MD;  Location: [facility];  Service: Gastroenterology;  Laterality: N/A;   FLEXIBLE SIGMOIDOSCOPY N/A 01/21/2022   Procedure: FLEXIBLE SIGMOIDOSCOPY;  Surgeon: Dianna Specking, MD;  Location: [facility];  Service: Gastroenterology;  Laterality: N/A;   FOOT SURGERY     toenail   INTRAUTERINE DEVICE INSERTION  08/07/2017   Mirena    IR EXCHANGE BILIARY DRAIN  02/11/2022   IR RADIOLOGIST EVAL & MGMT  02/08/2022   IR RADIOLOGIST EVAL & MGMT  02/20/2022   IR RADIOLOGIST EVAL & MGMT  02/28/2022   removal of melanoma     REMOVAL OF STONES  01/15/2022   Procedure: REMOVAL OF STONES;  Surgeon: Rosalie Oliva, MD;  Location: [facility];  Service: Gastroenterology;;   ANNETT  01/15/2022   Procedure: ANNETT;  Surgeon: Rosalie Oliva, MD;  Location: [facility];  Service: Gastroenterology;;    Prior to Admission medications   Medication Sig Start Date End Date Taking? Authorizing Provider  acetaminophen  (TYLENOL ) 500 MG tablet Take 500 mg by mouth every 6 (six) hours as needed.   Yes [provider]  Adapalene 0.3 % gel Apply 1 application  topically daily as needed. 04/29/22  Yes [provider]  alum & mag hydroxide-simeth (MAALOX PLUS) 400-400-40 MG/5ML suspension Take 15 mLs by mouth every 6 (six) hours as needed for indigestion. 05/07/24  Yes [name], MD  atovaquone  (MEPRON ) 750 MG/5ML suspension Take 750 mg by mouth 2 (two) times daily.   Yes [provider]  dicyclomine  (BENTYL ) 20 MG tablet  Take 1 tablet (20 mg total) by mouth 2 (two) times daily. Patient taking differently: Take 20 mg by mouth 2 (two) times daily as needed. 05/07/24  Yes [name], MD  EPINEPHrine  0.3 mg/0.3 mL IJ SOAJ injection Inject 0.3 mg into the muscle as needed for anaphylaxis. 10/08/22  Yes Tobie Arleta SQUIBB, MD  famotidine  (PEPCID ) 20 MG tablet Take 20 mg by mouth daily as needed for heartburn  or indigestion.   Yes [provider]  fluticasone  (FLONASE ) 50 MCG/ACT nasal spray Place 2 sprays into both nostrils daily. Patient taking differently: Place 2 sprays into both nostrils daily as needed. 10/08/22  Yes Tobie Arleta SQUIBB, MD  metoprolol  tartrate (LOPRESSOR ) 25 MG tablet Take 1 tablet (25 mg total) by mouth daily as needed (palpitations). 01/27/24  Yes Chandrasekhar, Mahesh A, MD  predniSONE  (DELTASONE ) 20 MG tablet Take 30 mg by mouth 2 (two) times daily.   Yes [provider]  estradiol  (ESTRACE ) 0.1 MG/GM vaginal cream Place 0.5 g vaginally 2 (two) times a week. Place 0.5g nightly for two weeks then twice a week after Patient not taking: Reported on 05/21/2024 01/08/24   Zuleta, Kaitlin G, NP  nitrofurantoin , macrocrystal-monohydrate, (MACROBID ) 100 MG capsule Take 1 capsule (100 mg total) by mouth 2 (two) times daily. Patient not taking: Reported on 04/06/2024 02/26/24   Zuleta, Kaitlin G, NP  oxyCODONE -acetaminophen  (PERCOCET/ROXICET) 5-325 MG tablet Take 1 tablet by mouth every 6 (six) hours as needed for severe pain (pain score 7-10). Patient not taking: Reported on 05/21/2024 05/07/24   [name], MD    Scheduled Meds:  atovaquone   750 mg Oral BID   diclofenac  Sodium  2 g Topical QID   famotidine   40 mg Oral Daily   metroNIDAZOLE   500 mg Oral Q12H   predniSONE   50 mg Oral Q breakfast   saccharomyces boulardii  250 mg Oral BID   Continuous Infusions:  cefTRIAXone  (ROCEPHIN )  IV 2 g (05/28/24 1024)   DAPTOmycin      PRN Meds:.acetaminophen , albuterol , bisacodyl , diphenhydrAMINE  **AND** metoCLOPramide  (REGLAN ) injection, loperamide , morphine  injection, ondansetron  **OR** ondansetron  (ZOFRAN ) IV, mouth rinse, oxyCODONE , polyethylene glycol, traZODone   Allergies as of 05/21/2024 - Review Complete 05/21/2024  Allergen Reaction Noted   Contrast media [iodinated contrast media] Shortness Of Breath 04/12/2016   Iodine Shortness Of Breath and Swelling 03/06/2011    Other Hives and Swelling 10/17/2013   Augmentin  [amoxicillin -pot clavulanate] Nausea And Vomiting 12/18/2022   Avocado Itching 09/28/2020   Levaquin [levofloxacin]  03/07/2022   Levofloxacin Hives 03/06/2011   Pantoprazole  Other (See Comments) 05/17/2021   Vancomycin  Cough 01/24/2022   Colestipol  hcl Rash 01/21/2022    Family History  Problem Relation Age of Onset   Tremor Mother    Stroke Father    Autoimmune disease Father    CVA Father    Breast cancer Sister 62       3X   Arthritis Sister        Psoriatic   Thyroid  disease Sister    Breast cancer Maternal Grandmother        Age 32   Thyroid  disease Daughter        Hypo   Uterine cancer Neg Hx    Bladder Cancer Neg Hx    Renal cancer Neg Hx     Social History   Socioeconomic History   Marital status: Married    Spouse name: Marcey Blumenthal   Number of children: 4   Years of education: Not on file  Highest education level: Not on file  Occupational History   Not on file  Tobacco Use   Smoking status: Former    Passive exposure: Past   Smokeless tobacco: Never  Vaping Use   Vaping status: Never Used  Substance and Sexual Activity   Alcohol use: Yes   Drug use: No   Sexual activity: Yes    Partners: Male    Birth control/protection: Post-menopausal    Comment: des neg  Other Topics Concern   Not on file  Social History Narrative   Not on file   Social Drivers of Health   Financial Resource Strain: Not on file  Food Insecurity: No Food Insecurity (05/22/2024)   Hunger Vital Sign    Worried About Running Out of Food in the Last Year: Never true    Ran Out of Food in the Last Year: Never true  Transportation Needs: No Transportation Needs (05/22/2024)   PRAPARE - Administrator, Civil Service (Medical): No    Lack of Transportation (Non-Medical): No  Physical Activity: Not on file  Stress: Not on file  Social Connections: Moderately Integrated (05/22/2024)   Social Connection and Isolation  Panel    Frequency of Communication with Friends and Family: More than three times a week    Frequency of Social Gatherings with Friends and Family: More than three times a week    Attends Religious Services: Never    Database administrator or Organizations: Yes    Attends Engineer, structural: More than 4 times per year    Marital Status: Married  Catering manager Violence: Not At Risk (05/22/2024)   Humiliation, Afraid, Rape, and Kick questionnaire    Fear of Current or Ex-Partner: No    Emotionally Abused: No    Physically Abused: No    Sexually Abused: No    Review of Systems: All negative except as stated above in HPI.  Physical Exam: Vital signs: Vitals:   05/27/24 2123 05/28/24 0649  BP: (!) 95/59 105/66  Pulse: 86 89  Resp: 18 18  Temp: 99.3 F (37.4 C) 99.5 F (37.5 C)  SpO2: 95% 94%   Last BM Date : 05/25/24 General:  Lethargic, Well-developed, well-nourished, pleasant and cooperative in NAD Head: normocephalic, atraumatic Eyes: anicteric sclera ENT: oropharynx clear Neck: supple, nontender Lungs:  Clear throughout to auscultation.   No wheezes, crackles, or rhonchi. No acute distress. Heart:  Regular rate and rhythm; no murmurs, clicks, rubs,  or gallops. Abdomen: diffuse tenderness (greatest in suprapubic) with guarding, soft, nondistended, +BS  Rectal:  Deferred Ext: no edema  GI:  Lab Results: Recent Labs    05/26/24 0327 05/27/24 0543 05/28/24 0501  WBC 13.0* 14.8* 14.8*  HGB 9.4* 9.9* 10.5*  HCT 28.5* 32.3* 33.0*  PLT 49* 80* 134*   BMET Recent Labs    05/26/24 0327 05/27/24 0543 05/28/24 0501  NA 140 139 135  K 3.5 3.6 3.3*  CL 108 104 99  CO2 21* 25 26  GLUCOSE 170* 90 89  BUN 16 12 10   CREATININE 0.53 0.47 0.53  CALCIUM 7.8* 7.9* 7.7*   LFT Recent Labs    05/28/24 0501  PROT 5.8*  ALBUMIN 2.2*  AST 18  ALT 26  ALKPHOS 100  BILITOT 1.1   PT/INR Recent Labs    05/27/24 0543  LABPROT 14.2  INR 1.0      Studies/Results:  Impression/Plan: Klebsiella bacteremia and liver abscesses of unclear etiology on broad spectrum antibiotics  followed by ID. Normal LFTs and no biliary dilation on MRCP. Previous sphincterotomy with pneumobilia. Patient very concerned about another ERCP due to previous complications from it. No urgent need for ERCP but if infection does not resolve then may need an ERCP in near future. Dr. Rosalie will reevaluate tomorrow. Supportive care.    LOS: 6 days   Jerrell JAYSON Sol  05/28/2024, 1:59 PM  Questions please call [phone]

## 2024-05-28 NOTE — Progress Notes (Signed)
 Progress Note    Laurie Terry   FMW:[id number]  DOB: Feb 19, 1966  DOA: 05/21/2024     6 PCP: [name], MD  Initial CC: lethargy  Hospital Course: 75 yof w/ history significant for esophageal spasm, aortic aneurysm, bicuspid aortic valve recently diagnosed recently with giant cell arteritis as well as PMR on high-dose steroid being admitted to the hospital with acute thrombocytopenia.   Recently diagnosed with GCA and PMR a month ago, saw a rheumatologist and has been on high-dose steroids. Her PMR symptoms (joint pains) resolved very rapidly, however she has continued to have low-grade headaches and vision changes. Over the last few days, she was having fever, severe lethargy, and relative hypotension with no focal symptoms, or symptoms of acute infection, no bruising or bleeding. She went to her PCP for evaluation and labs showed with platelet count 30K and referred to the ED  Found to have sepsis from Klebsiella bacteremia.  Patient was also found to have small abscesses on her liver (similar presentation back in 2023/2017?)  Assessment and plan:  Acute Thrombocytopenia Recent clinical diagnosis GCA Recent diagnosis PMR - Found to have abnormal labs and routine PCP workup for low-grade fever fatigue-in the setting of high-dose steroid for recently diagnosed GCA/PMR/autoimmune disorder. - GCA diagnosis appears to be a clinical diagnosis outpatient.  No temporal artery biopsy performed-it appears a vascular ultrasound of the temporal arteries was ordered this weekend and negative but not the gold standard - with further collateral info: known history of ocular migraines with aura's and recent headaches have been similar with aura (but slightly different) however she never had decreased vision or vision loss. She has had photosensitivity with this prolonged episode and symptoms since ~June (?prolonged complicated migraine? Instead of diagnosis of GCA especially without biopsy or  other modalities. She does state inflammatory marker was elevated at rheumatology evaluation and with presentation she was then diagnosed with GCA) - Need to ensure outpatient follow-up with rheumatology (Dr. Jon Jacob)- discussed with Dr. Jacob- that even if biopsy done and if negative would still treat.  Can try to wean steroids but if worsening headaches will need to increase/slow - Seen by hematology  - Platelets slowly improving with treatment of infection - Continue to monitor  Osteitis pubis -on steroids MRI with ? Septic joint/myositis -trial of voltaren  gel -IV NSAIDS IV abx  Abnormal liver MRI - IR consulted- s/p aspiration -Very similar situation in May 2023 -culture pending  Klebsiella bacteremia  Severe sepsis-resolved - No obvious urinary symptoms although does report some very mild dysuria prior to admission and even some suprapubic discomfort; on exam she also has right CVA tenderness.  CT A/P also showed bilateral perinephric fat stranding concerning for pyelonephritis.  Outpatient last Friday she had urine culture performed which showed 50-100 K mixed urogenital flora.  Most likely source seems to be urinary at this time.   - Antibiotics per ID   Sinus bradycardia.  Heart rate recorded in high 20s to low 30s. Cardiology was consulted, remained asymptomatic. Per cardiology most likely due to metabolic derangements secondary to sepsis.   Abdominal pain and diarrhea.  Patient with worsening abdominal pain, had 6 episodes of watery diarrhea yesterday, just 1 since this morning, abdominal pain worsening.  No nausea or vomiting.  Patient is on antibiotics. -Less likely C. difficile but patient is high risk -Monitor  Low TSH.  Patient was found to have mildly low TSH and free T4, likely secondary to sepsis. - Need repeat levels  in 4 to 6 weeks by PCP  Normocytic anemia - No obvious bleeding but with thrombocytopenia, hemoglobin has downtrended some since  admission - Trend  Prediabetes - A1c 6.4%.  Hyperglycemia likely in setting of steroid use - Controlled for now, but if up trends further will need to start on sliding scale  Mild metabolic acidosis - Continue to trend lactic acid  Interval History:  Did not get toradol  yesterday-- will give today    DVT prophylaxis:  Place TED hose Start: 05/26/24 1022 Place and maintain sequential compression device Start: 05/23/24 0944   Code Status:   Code Status: Full Code     Objective: Blood pressure 105/66, pulse 89, temperature 99.5 F (37.5 C), resp. rate 18, height 5' 5 (1.651 m), weight 78.6 kg, SpO2 94%.     General: Appearance:     Overweight female in no acute distress     Lungs:      respirations unlabored  Heart:    Normal heart rate.   MS:   All extremities are intact.   Neurologic:   Awake, alert, oriented x 3. No apparent focal neurological           defect.    Consultants:  Hematology ID cardiology    Data Reviewed: Results for orders placed or performed during the hospital encounter of 05/21/24 (from the past 24 hours)  Aerobic/Anaerobic Culture w Gram Stain (surgical/deep wound)     Status: None (Preliminary result)   Collection Time: 05/27/24  3:18 PM   Specimen: Abscess  Result Value Ref Range   Specimen Description      ABSCESS Performed at [facility], [address] [place], [place] [address]    Special Requests      LIVER Performed at [facility], [address] [place], [place] [address]    Gram Stain      FEW WBC PRESENT, PREDOMINANTLY PMN MODERATE GRAM POSITIVE COCCI    Culture      TOO YOUNG TO READ Performed at [facility], [address], [place], [place] [address]    Report Status PENDING   CK     Status: Abnormal   Collection Time: 05/28/24  5:00 AM  Result Value Ref Range   Total CK 31 (L) 38 - 234 U/L  CBC with Differential/Platelet     Status: Abnormal   Collection Time:  05/28/24  5:01 AM  Result Value Ref Range   WBC 14.8 (H) 4.0 - 10.5 K/uL   RBC 3.69 (L) 3.87 - 5.11 MIL/uL   Hemoglobin 10.5 (L) 12.0 - 15.0 g/dL   HCT 66.9 (L) 63.9 - 53.9 %   MCV 89.4 80.0 - 100.0 fL   MCH 28.5 26.0 - 34.0 pg   MCHC 31.8 30.0 - 36.0 g/dL   RDW 83.7 (H) 88.4 - 84.4 %   Platelets 134 (L) 150 - 400 K/uL   nRBC 0.9 (H) 0.0 - 0.2 %   Neutrophils Relative % 76 %   Neutro Abs 11.3 (H) 1.7 - 7.7 K/uL   Lymphocytes Relative 9 %   Lymphs Abs 1.3 0.7 - 4.0 K/uL   Monocytes Relative 3 %   Monocytes Absolute 0.5 0.1 - 1.0 K/uL   Eosinophils Relative 0 %   Eosinophils Absolute 0.0 0.0 - 0.5 K/uL   Basophils Relative 1 %   Basophils Absolute 0.1 0.0 - 0.1 K/uL   WBC Morphology See Note    RBC Morphology MORPHOLOGY UNREMARKABLE  Smear Review Normal platelet morphology    Immature Granulocytes 11 %   Abs Immature Granulocytes 1.61 (H) 0.00 - 0.07 K/uL  Magnesium      Status: None   Collection Time: 05/28/24  5:01 AM  Result Value Ref Range   Magnesium  2.2 1.7 - 2.4 mg/dL  Comprehensive metabolic panel with GFR     Status: Abnormal   Collection Time: 05/28/24  5:01 AM  Result Value Ref Range   Sodium 135 135 - 145 mmol/L   Potassium 3.3 (L) 3.5 - 5.1 mmol/L   Chloride 99 98 - 111 mmol/L   CO2 26 22 - 32 mmol/L   Glucose, Bld 89 70 - 99 mg/dL   BUN 10 6 - 20 mg/dL   Creatinine, Ser 9.46 0.44 - 1.00 mg/dL   Calcium 7.7 (L) 8.9 - 10.3 mg/dL   Total Protein 5.8 (L) 6.5 - 8.1 g/dL   Albumin 2.2 (L) 3.5 - 5.0 g/dL   AST 18 15 - 41 U/L   ALT 26 0 - 44 U/L   Alkaline Phosphatase 100 38 - 126 U/L   Total Bilirubin 1.1 0.0 - 1.2 mg/dL   GFR, Estimated >39 >39 mL/min   Anion gap 11 5 - 15  Lactic acid, plasma     Status: None   Collection Time: 05/28/24  5:01 AM  Result Value Ref Range   Lactic Acid, Venous 1.7 0.5 - 1.9 mmol/L    I have reviewed pertinent nursing notes, vitals, labs, and images as necessary. I have ordered labwork to follow up on as indicated.  I have  reviewed the last notes from staff over past 24 hours. I have discussed patient's care plan and test results with nursing staff, CM/SW, and other staff as appropriate.  Time spent: Greater than 50% of the 50 minute visit was spent in counseling/coordination of care for the patient as laid out in the A&P.   LOS: 6 days   Sutton Hirsch U Ethanael Veith, DO [facility] 05/28/2024, 11:55 AM

## 2024-05-28 NOTE — Progress Notes (Signed)
 Doctor and patient unsure why Toradol  was not administered yesterday afternoon. One time dose moved to today per MD.

## 2024-05-28 NOTE — Progress Notes (Signed)
 [facility]  Date of Admission:  05/21/2024   Total days of inpatient antibiotics 6  Principal Problem:   Thrombocytopenia (HCC) Active Problems:   Bacteremia due to Klebsiella pneumoniae   Normocytic anemia   Sinus bradycardia   Pubic bone pain          Assessment: 71 YF with past medical history of bicuspid aortic valve, aortic aneurysm, recently due to stress arteritis and PMR started on high-dose rest few months ago, prophylaxis with atovaqoune, Hx of intra-abdomianal (subhepatic/perinephric) abscess following ercp in 2023 SP IR drainage cx + efaecalis + yeast treated with augmentin   + fluconzole admitted with:   #Klebsiella pneumoniae bacteremia 2/2 liver abscess #mild dysuria -PT presented with lethargy and hypotension.Plt 23 on admission.  Blood Cx + Kleb pneomo. Ucx on 8/29 + leukocytes, did not reflex to Cx -CT on 8/31 showed mild bilateral perinephric fat stranding, mildly prior stasis.  new subcentimeter hypodensity in the liver too small to characterize.  Differential includes infectious etiology.  Definitely not seen on imaging on 05/07/2024. - On review of 2023 admission, ct did not show abscess, fluid was noted on MRCP in subhepatic/perinephric space  -MRCP showed 8 scattered slightly Kuster hyperenhancing lesions in the liver lesions rim-enhancing concern for small abscesses given that were not present on imaging on 03/29/2022.  Complicated assessment of biliary tree due to pneumobilia and motion artifact.   #GCA and PMR on steroids #Thrombocytopenia -H/O following for thrombocytopenia, sps IVIG x 2 days, plt 37 -continue atovaqoune. Noted that suspect thrombocypenia 2/2 infection. She is on appropriate abx with limited improvent in plt count, thrombocytopenia likely multifactorial.     #Amox intolerance -Although she tolerated it in 2023. Notes that she had N/V following abx the last 2 times she was on it.   #Pelvic pain secondary  to osteitis with myositis -MR of pelvis with and without contrast showed abnormal edema pubic bone and the pubic symphysis possibly early septic joint, osteitis pubis, posttraumatic injury of the pubis.  Subtle edema tracking along the hip abductor, improving subcutaneous edema foci of the pelvis, low-grade myositis.  No drainable abscess - As there is no drainable collection, would leave a daptomycin  and ceftriaxone   Recommendations: - Anticipate prolonged course of antibiotics at least 4 weeks given ostitis, early septic joint. -Follow aspirate Cx -Add daptomycin  to cover for gpc -Consider engaging GI given complicated biliary tree with pneumobilia and prior intra-abdominal abscess. -Continue ceftriaxone  and  metronidazole  -Standard precautions -Dr. Epifanio is covering this weekend.  New ID team Monday  Microbiology:   Antibiotics: Atovaqoune-> pjp ppx Ceftiaxone 8/31- Cultures: Blood 8/30 1/1 klebseilla pneumoniae Urine 8/29 postive leukocytes, rare bacteria, negative nitrites, did not reflex to Cx Other 05/27/24 aerobic/anaerobic cx : gpc on gram stain, afb and fungal pening  SUBJECTIVE: Sitting in chair.  Interval: afebrile overnight. Wbc 14.8k  Review of Systems: Review of Systems  All other systems reviewed and are negative.    Scheduled Meds:  atovaquone   750 mg Oral BID   diclofenac  Sodium  2 g Topical QID   famotidine   40 mg Oral Daily   ketorolac   15 mg Intravenous Once   metroNIDAZOLE   500 mg Oral Q12H   predniSONE   50 mg Oral Q breakfast   saccharomyces boulardii  250 mg Oral BID   Continuous Infusions:  cefTRIAXone  (ROCEPHIN )  IV 2 g (05/28/24 1024)   DAPTOmycin      PRN Meds:.acetaminophen , albuterol , bisacodyl , diphenhydrAMINE  **  AND** metoCLOPramide  (REGLAN ) injection, loperamide , morphine  injection, ondansetron  **OR** ondansetron  (ZOFRAN ) IV, mouth rinse, oxyCODONE , polyethylene glycol, traZODone  Allergies  Allergen Reactions   Contrast Media  [Iodinated Contrast Media] Shortness Of Breath    Low blood pressure    Iodine Shortness Of Breath and Swelling   Midodrine  Shortness Of Breath   Other Hives and Swelling    Seafood   Augmentin  [Amoxicillin -Pot Clavulanate] Nausea And Vomiting   Avocado Itching    Tongue and mouth   Levaquin [Levofloxacin]     Not recommended in Aortic aneurysm patients.   Levofloxacin Hives    Pt already has aortic aneurysm.   Pantoprazole  Other (See Comments)    Joint aches, fever, peeling of skin   Vancomycin  Cough    Patient develops cough, rash ears, tingling lips after receiving Cefepime  and vancomycin  01/23/2022   Colestipol  Hcl Rash    OBJECTIVE: Vitals:   05/27/24 1545 05/27/24 1605 05/27/24 2123 05/28/24 0649  BP: (!) 100/53 (!) 99/58 (!) 95/59 105/66  Pulse: (!) 51 (!) 58 86 89  Resp:  18 18 18   Temp:   99.3 F (37.4 C) 99.5 F (37.5 C)  TempSrc:      SpO2: 97% 97% 95% 94%  Weight:      Height:       Body mass index is 28.84 kg/m.  Physical Exam Constitutional:      Appearance: Normal appearance.  HENT:     Head: Normocephalic and atraumatic.     Right Ear: Tympanic membrane normal.     Left Ear: Tympanic membrane normal.     Nose: Nose normal.     Mouth/Throat:     Mouth: Mucous membranes are moist.  Eyes:     Extraocular Movements: Extraocular movements intact.     Conjunctiva/sclera: Conjunctivae normal.     Pupils: Pupils are equal, round, and reactive to light.  Cardiovascular:     Rate and Rhythm: Normal rate and regular rhythm.     Heart sounds: No murmur heard.    No friction rub. No gallop.  Pulmonary:     Effort: Pulmonary effort is normal.     Breath sounds: Normal breath sounds.  Abdominal:     General: Abdomen is flat.     Palpations: Abdomen is soft.  Musculoskeletal:        General: Normal range of motion.  Skin:    General: Skin is warm and dry.  Neurological:     General: No focal deficit present.     Mental Status: She is alert and  oriented to person, place, and time.  Psychiatric:        Mood and Affect: Mood normal.       Lab Results Lab Results  Component Value Date   WBC 14.8 (H) 05/28/2024   HGB 10.5 (L) 05/28/2024   HCT 33.0 (L) 05/28/2024   MCV 89.4 05/28/2024   PLT 134 (L) 05/28/2024    Lab Results  Component Value Date   CREATININE 0.53 05/28/2024   BUN 10 05/28/2024   NA 135 05/28/2024   K 3.3 (L) 05/28/2024   CL 99 05/28/2024   CO2 26 05/28/2024    Lab Results  Component Value Date   ALT 26 05/28/2024   AST 18 05/28/2024   ALKPHOS 100 05/28/2024   BILITOT 1.1 05/28/2024        Loney Stank, MD [facility] 05/28/2024, 11:42 AM Evaluation of this patient requires complex antimicrobial therapy  evaluation and counseling + isolation needs for disease transmission risk assessment and mitigation

## 2024-05-28 NOTE — Evaluation (Signed)
 Physical Therapy Evaluation Patient Details Name: Laurie Terry MRN: [id number] DOB: 1966/04/14 Today's Date: 05/28/2024  History of Present Illness  Laurie Terry is a 58 y.o. female admitted to the hospital with acute thrombocytopenia; found to have Klebsiella bacteremia, liver abscess, Osteitis pubis. Pt s/p US  guided aspiration of hepatic abscess 05/27/24. PMH: esophageal spasm, aortic aneurysm, bicuspid aortic valve, melanoma, osteomalacia, recently diagnosed with giant cell arteritis as well as PMR on high-dose steroid  Clinical Impression  Pt admitted with above diagnosis. Pt reports ind at baseline, lives in 2 level home with office on 2nd level, 4-5 steps to enter the home, walks daily for exercise. On eval, pt reports new onset tingling in bil toes that comes and goes. Pt denies change in sensation to peri region, denies loss of bowel/bladder and describes pain as public symphysis region and superior to that as pressure and it feels like I'm pregnant. Pt with ankle and knee AROM WFL. Hip limited bil with hip abduction 4-/5 and hip adduction 3/5 with significant increased pain at pubic symphysis region and superior. Assisted pt to hooklying position in bed, able to perform isometric glute sets and bridges without difficulty or discomfort. Pt with weak hip abduction in hooklying and weak hip adduction in hooklying to the point pt unable due to pain. In standing, no gross asymmetries noted at ASIS, PSIS, SI joint, iliac crest and no tenderness to palpation at any sites. MD into room with pharmacy during eval so further ambulation unable to be attempted. Pt would benefit from further outpatient PT evaluation. Will continue to assess for proper DME recommendation. Pt currently with functional limitations due to the deficits listed below (see PT Problem List). Pt will benefit from acute skilled PT to increase their independence and safety with mobility to allow discharge.           If  plan is discharge home, recommend the following: A little help with walking and/or transfers;A little help with bathing/dressing/bathroom;Assistance with cooking/housework;Assist for transportation;Help with stairs or ramp for entrance   Can travel by private vehicle        Equipment Recommendations Other (comment) (TBD)  Recommendations for Other Services       Functional Status Assessment Patient has had a recent decline in their functional status and demonstrates the ability to make significant improvements in function in a reasonable and predictable amount of time.     Precautions / Restrictions Precautions Precautions: Fall Restrictions Weight Bearing Restrictions Per Provider Order: No      Mobility  Bed Mobility Overal bed mobility: Needs Assistance Bed Mobility: Sit to Supine, Supine to Sit     Supine to sit: Min assist, Used rails Sit to supine: Mod assist, Used rails   General bed mobility comments: through semi log rolling position, min A to lift trunk up into sitting, mod A to lift BLE back into bed    Transfers Overall transfer level: Needs assistance Equipment used: None Transfers: Sit to/from Stand Sit to Stand: Contact guard assist           General transfer comment: slow to power to stand, wide stance, increased pain with pt reaching for furniture/wall/therapist once standing    Ambulation/Gait Ambulation/Gait assistance: Contact guard assist Gait Distance (Feet): 6 Feet Assistive device: 1 person hand held assist   Gait velocity: decreased     General Gait Details: pt taking slow steps at bedside, wide BOS, increased lateral weightshifting, HHA with therapist, no LE Buckling noted, increased pressure pain  in pubic symphysis region limiting standing tolerance  Stairs            Wheelchair Mobility     Tilt Bed    Modified Rankin (Stroke Patients Only)       Balance Overall balance assessment: Needs assistance Sitting-balance  support: Feet supported Sitting balance-Leahy Scale: Good     Standing balance support: During functional activity, Single extremity supported Standing balance-Leahy Scale: Fair                               Pertinent Vitals/Pain Pain Assessment Pain Assessment: Faces Faces Pain Scale: Hurts whole lot Pain Location: pubic symphysis with palpation and weightbearing Pain Descriptors / Indicators: Pressure (it feels like I'm pregnant) Pain Intervention(s): Limited activity within patient's tolerance, Monitored during session, Repositioned    Home Living Family/patient expects to be discharged to:: Private residence Living Arrangements: Spouse/significant other Available Help at Discharge: Family Type of Home: House Home Access: Stairs to enter Entrance Stairs-Rails: Left Entrance Stairs-Number of Steps: 4-5 Alternate Level Stairs-Number of Steps: flight Home Layout: Two level;Able to live on main level with bedroom/bathroom (office upstairs) Home Equipment: None      Prior Function Prior Level of Function : Independent/Modified Independent;Working/employed             Mobility Comments: pt ind, walks daily for exericse ADLs Comments: pt ind     Extremity/Trunk Assessment   Upper Extremity Assessment Upper Extremity Assessment: Overall WFL for tasks assessed    Lower Extremity Assessment Lower Extremity Assessment: RLE deficits/detail;LLE deficits/detail RLE Deficits / Details: ankle and knee AROM WFL, hip abduction 4-/5, hip adduction 3/5 RLE Sensation: WNL (intermittent tingling in toes pt reports is new) RLE Coordination: WNL LLE Deficits / Details: ankle and knee AROM WFL, hip abduction 4-/5, hip adduction 3/5 LLE Sensation: WNL (intermittent tingling in toes pt reports is new) LLE Coordination: WNL    Cervical / Trunk Assessment Cervical / Trunk Assessment: Normal  Communication   Communication Communication: No apparent difficulties     Cognition Arousal: Alert Behavior During Therapy: WFL for tasks assessed/performed   PT - Cognitive impairments: No apparent impairments                         Following commands: Intact       Cueing       General Comments      Exercises Other Exercises Other Exercises: isometric glute sets with 5 sec isometric hold, x10 reps Other Exercises: bridge x10 reps   Assessment/Plan    PT Assessment Patient needs continued PT services  PT Problem List Decreased strength;Decreased activity tolerance;Decreased balance;Pain;Impaired sensation       PT Treatment Interventions DME instruction;Gait training;Stair training;Functional mobility training;Therapeutic activities;Therapeutic exercise;Neuromuscular re-education;Patient/family education;Modalities    PT Goals (Current goals can be found in the Care Plan section)  Acute Rehab PT Goals Patient Stated Goal: improvement in pain in order to walk PT Goal Formulation: With patient Time For Goal Achievement: 06/11/24 Potential to Achieve Goals: Good    Frequency Min 3X/week     Co-evaluation               AM-PAC PT 6 Clicks Mobility  Outcome Measure Help needed turning from your back to your side while in a flat bed without using bedrails?: A Little Help needed moving from lying on your back to sitting on the side of a flat  bed without using bedrails?: A Little Help needed moving to and from a bed to a chair (including a wheelchair)?: A Little Help needed standing up from a chair using your arms (e.g., wheelchair or bedside chair)?: A Little Help needed to walk in hospital room?: A Little Help needed climbing 3-5 steps with a railing? : A Lot 6 Click Score: 17    End of Session   Activity Tolerance: Patient tolerated treatment well Patient left: in chair;with call bell/phone within reach;Other (comment) (MD and pharmacy in room) Nurse Communication: Mobility status PT Visit Diagnosis: Muscle weakness  (generalized) (M62.81);Pain Pain - part of body:  (pubic symphysis)    Time: 9045-8986 PT Time Calculation (min) (ACUTE ONLY): 19 min   Charges:   PT Evaluation $PT Eval Moderate Complexity: 1 Mod   PT General Charges $$ ACUTE PT VISIT: 1 Visit         Tori Chenelle Benning PT, DPT 05/28/24, 10:51 AM

## 2024-05-28 NOTE — Progress Notes (Addendum)
 [facility]  Date of Admission:  05/21/2024   Total days of inpatient antibiotics 5  Principal Problem:   Thrombocytopenia (HCC) Active Problems:   Bacteremia due to Klebsiella pneumoniae   Normocytic anemia   Sinus bradycardia   Pubic bone pain          Assessment: 45 YF with past medical history of bicuspid aortic valve, aortic aneurysm, recently due to stress arteritis and PMR started on high-dose rest few months ago, prophylaxis with atovaqoune, Hx of intra-abdomianal (subhepatic/perinephric) abscess following ercp in 2023 SP IR drainage cx + efaecalis + yeast treated with augmentin   + fluconzole admitted with:   #Klebsiella pneumoniae bacteremia 2/2 liver abscess #mild dysuria -PT presented with lethargy and hypotension.Plt 23 on admission.  Blood Cx + Kleb pneomo. Ucx on 8/29 + leukocytes, did not reflex to Cx -CT on 8/31 showed mild bilateral perinephric fat stranding, mildly prior stasis.  new subcentimeter hypodensity in the liver too small to characterize.  Differential includes infectious etiology.  Definitely not seen on imaging on 05/07/2024. - On review of 2023 admission, ct did not show abscess, fluid was noted on MRCP in subhepatic/perinephric space  -MRCP showed 8 scattered slightly Kuster hyperenhancing lesions in the liver lesions rim-enhancing concern for small abscesses given that were not present on imaging on 03/29/2022.  Complicated assessment of biliary tree due to pneumobilia and motion artifact.   #GCA and PMR on steroids #Thrombocytopenia -H/O following for thrombocytopenia, sps IVIG x 2 days, plt 37 -continue atovaqoune. Noted that suspect thrombocypenia 2/2 infection. She is on appropriate abx with limited improvent in plt count, thrombocytopenia likely multifactorial.    #Diarrhea -resolved. But stool less formed today, will add probiotics   #Amox intolerance -Although she tolerated it in 2023. Notes that she had N/V  following abx the last 2 times she was on it.    Recommendations:  -IR engaged for aspiration, follow cx -Consider engaging GI given complicated biliary tree with pneumobilia and prior intra-abdominal abscess. -Continue ceftriaxone  and  metronidazole  -MR pelvis given osteitis on pelvic US  and continued pelvic pain with ambulation -Standard precautions     Microbiology:   Antibiotics: Atovaqoune-> pjp ppx Ceftiaxone 8/31- Cultures: Blood 8/30 1/1 klebseilla pneumoniae Urine 8/29 postive leukocytes, rare bacteria, negative nitrites, did not reflex to Cx  SUBJECTIVE: Laying in  bed. Mother at bedside Interval: Afebrile overnight.  WBC 14K.  Review of Systems: Review of Systems  All other systems reviewed and are negative.    Scheduled Meds:  atovaquone   750 mg Oral BID   diclofenac  Sodium  2 g Topical QID   famotidine   40 mg Oral Daily   ketorolac   15 mg Intravenous Once   metroNIDAZOLE   500 mg Oral Q12H   predniSONE   50 mg Oral Q breakfast   saccharomyces boulardii  250 mg Oral BID   Continuous Infusions:  cefTRIAXone  (ROCEPHIN )  IV 2 g (05/27/24 0942)   PRN Meds:.acetaminophen , albuterol , bisacodyl , diphenhydrAMINE  **AND** metoCLOPramide  (REGLAN ) injection, loperamide , morphine  injection, ondansetron  **OR** ondansetron  (ZOFRAN ) IV, mouth rinse, oxyCODONE , polyethylene glycol, traZODone  Allergies  Allergen Reactions   Contrast Media [Iodinated Contrast Media] Shortness Of Breath    Low blood pressure    Iodine Shortness Of Breath and Swelling   Midodrine  Shortness Of Breath   Other Hives and Swelling    Seafood   Augmentin  [Amoxicillin -Pot Clavulanate] Nausea And Vomiting   Avocado Itching    Tongue and mouth  Levaquin [Levofloxacin]     Not recommended in Aortic aneurysm patients.   Levofloxacin Hives    Pt already has aortic aneurysm.   Pantoprazole  Other (See Comments)    Joint aches, fever, peeling of skin   Vancomycin  Cough    Patient develops cough,  rash ears, tingling lips after receiving Cefepime  and vancomycin  01/23/2022   Colestipol  Hcl Rash    OBJECTIVE: Vitals:   05/27/24 1528 05/27/24 1545 05/27/24 1605 05/27/24 2123  BP: (!) 97/55 (!) 100/53 (!) 99/58 (!) 95/59  Pulse: (!) 54 (!) 51 (!) 58 86  Resp: 12  18 18   Temp:    99.3 F (37.4 C)  TempSrc:      SpO2: 96% 97% 97% 95%  Weight:      Height:       Body mass index is 28.84 kg/m.  Physical Exam Constitutional:      Appearance: Normal appearance.  HENT:     Head: Normocephalic and atraumatic.     Right Ear: Tympanic membrane normal.     Left Ear: Tympanic membrane normal.     Nose: Nose normal.     Mouth/Throat:     Mouth: Mucous membranes are moist.  Eyes:     Extraocular Movements: Extraocular movements intact.     Conjunctiva/sclera: Conjunctivae normal.     Pupils: Pupils are equal, round, and reactive to light.  Cardiovascular:     Rate and Rhythm: Normal rate and regular rhythm.     Heart sounds: No murmur heard.    No friction rub. No gallop.  Pulmonary:     Effort: Pulmonary effort is normal.     Breath sounds: Normal breath sounds.  Abdominal:     General: Abdomen is flat.     Palpations: Abdomen is soft.  Musculoskeletal:        General: Normal range of motion.  Skin:    General: Skin is warm and dry.  Neurological:     General: No focal deficit present.     Mental Status: She is alert and oriented to person, place, and time.  Psychiatric:        Mood and Affect: Mood normal.       Lab Results Lab Results  Component Value Date   WBC 14.8 (H) 05/27/2024   HGB 9.9 (L) 05/27/2024   HCT 32.3 (L) 05/27/2024   MCV 92.0 05/27/2024   PLT 80 (L) 05/27/2024    Lab Results  Component Value Date   CREATININE 0.47 05/27/2024   BUN 12 05/27/2024   NA 139 05/27/2024   K 3.6 05/27/2024   CL 104 05/27/2024   CO2 25 05/27/2024    Lab Results  Component Value Date   ALT 22 05/27/2024   AST 14 (L) 05/27/2024   ALKPHOS 82 05/27/2024    BILITOT 1.0 05/27/2024        Loney Stank, MD [facility] 05/28/2024, 5:24 AM Evaluation of this patient requires complex antimicrobial therapy evaluation and counseling + isolation needs for disease transmission risk assessment and mitigation

## 2024-05-28 NOTE — Progress Notes (Signed)
 Pharmacy Antibiotic Note  Laurie Terry is a 58 y.o. female admitted on 05/21/2024 with bacteremia, osteitis, and early septic joint.  Pharmacy has been consulted for Daptomycin  dosing.  Plan: Start Daptomycin  700 mg IV every 24 hours Continue Ceftriaxone  2g IV every 24 hours  Continue Flagyl  500mg  PO every 12 hours Follow-up baseline CK today, then check weekly  Monitor renal function, culture results, length of therapy, and antibiotic de-escalation plans.   Height: 5' 5 (165.1 cm) Weight: 78.6 kg (173 lb 4.5 oz) IBW/kg (Calculated) : 57  Temp (24hrs), Avg:99 F (37.2 C), Min:98.1 F (36.7 C), Max:99.5 F (37.5 C)  Recent Labs  Lab 05/23/24 0513 05/23/24 1011 05/24/24 0505 05/24/24 1930 05/25/24 0318 05/25/24 0905 05/26/24 0327 05/27/24 0543 05/28/24 0501  WBC 7.6  --  8.9  --  13.2*  --  13.0* 14.8* 14.8*  CREATININE 0.77  --   --   --  0.53  --  0.53 0.47 0.53  LATICACIDVEN  --    < >  --  3.5*  --  3.0* 2.9* 2.4* 1.7   < > = values in this interval not displayed.    Estimated Creatinine Clearance: 78.4 mL/min (by C-G formula based on SCr of 0.53 mg/dL).    Allergies  Allergen Reactions   Contrast Media [Iodinated Contrast Media] Shortness Of Breath    Low blood pressure    Iodine Shortness Of Breath and Swelling   Midodrine  Shortness Of Breath   Other Hives and Swelling    Seafood   Augmentin  [Amoxicillin -Pot Clavulanate] Nausea And Vomiting   Avocado Itching    Tongue and mouth   Levaquin [Levofloxacin]     Not recommended in Aortic aneurysm patients.   Levofloxacin Hives    Pt already has aortic aneurysm.   Pantoprazole  Other (See Comments)    Joint aches, fever, peeling of skin   Vancomycin  Cough    Patient develops cough, rash ears, tingling lips after receiving Cefepime  and vancomycin  01/23/2022   Colestipol  Hcl Rash    Antimicrobials this admission: 8/30 CTX >>   9/4 Flagyl  >> 9/5 Daptomycin  >>     Microbiology results: 8/30 BCx: K.  Pneumo (cefazolin  sens) 9/1 MRSA PCR: not detected 9/4 Abscess cx: moderate GPC; pending  Dionicia Canavan, PharmD, RPh PGY1 Acute Care Pharmacy Resident [facility]  05/28/2024 12:06 PM

## 2024-05-28 NOTE — Plan of Care (Signed)

## 2024-05-29 DIAGNOSIS — D696 Thrombocytopenia, unspecified: Secondary | ICD-10-CM | POA: Diagnosis not present

## 2024-05-29 DIAGNOSIS — M6088 Other myositis, other site: Secondary | ICD-10-CM

## 2024-05-29 LAB — CBC WITH DIFFERENTIAL/PLATELET
Abs Immature Granulocytes: 0.92 K/uL — ABNORMAL HIGH (ref 0.00–0.07)
Basophils Absolute: 0.1 K/uL (ref 0.0–0.1)
Basophils Relative: 1 %
Eosinophils Absolute: 0 K/uL (ref 0.0–0.5)
Eosinophils Relative: 0 %
HCT: 31.5 % — ABNORMAL LOW (ref 36.0–46.0)
Hemoglobin: 9.6 g/dL — ABNORMAL LOW (ref 12.0–15.0)
Immature Granulocytes: 8 %
Lymphocytes Relative: 8 %
Lymphs Abs: 0.9 K/uL (ref 0.7–4.0)
MCH: 27.9 pg (ref 26.0–34.0)
MCHC: 30.5 g/dL (ref 30.0–36.0)
MCV: 91.6 fL (ref 80.0–100.0)
Monocytes Absolute: 0.4 K/uL (ref 0.1–1.0)
Monocytes Relative: 4 %
Neutro Abs: 8.8 K/uL — ABNORMAL HIGH (ref 1.7–7.7)
Neutrophils Relative %: 79 %
Platelets: 136 K/uL — ABNORMAL LOW (ref 150–400)
RBC: 3.44 MIL/uL — ABNORMAL LOW (ref 3.87–5.11)
RDW: 16.3 % — ABNORMAL HIGH (ref 11.5–15.5)
Smear Review: NORMAL
WBC: 11 K/uL — ABNORMAL HIGH (ref 4.0–10.5)
nRBC: 0.2 % (ref 0.0–0.2)

## 2024-05-29 LAB — COMPREHENSIVE METABOLIC PANEL WITH GFR
ALT: 29 U/L (ref 0–44)
AST: 17 U/L (ref 15–41)
Albumin: 2.2 g/dL — ABNORMAL LOW (ref 3.5–5.0)
Alkaline Phosphatase: 93 U/L (ref 38–126)
Anion gap: 9 (ref 5–15)
BUN: 12 mg/dL (ref 6–20)
CO2: 28 mmol/L (ref 22–32)
Calcium: 8.1 mg/dL — ABNORMAL LOW (ref 8.9–10.3)
Chloride: 104 mmol/L (ref 98–111)
Creatinine, Ser: 0.59 mg/dL (ref 0.44–1.00)
GFR, Estimated: >60 mL/min (ref >60)
Glucose, Bld: 97 mg/dL (ref 70–99)
Potassium: 3.7 mmol/L (ref 3.5–5.1)
Sodium: 141 mmol/L (ref 135–145)
Total Bilirubin: 0.7 mg/dL (ref 0.0–1.2)
Total Protein: 5.5 g/dL — ABNORMAL LOW (ref 6.5–8.1)

## 2024-05-29 LAB — MAGNESIUM: Magnesium: 2.5 mg/dL — ABNORMAL HIGH (ref 1.7–2.4)

## 2024-05-29 NOTE — Progress Notes (Signed)
 Laurie Terry Un 10:08 AM  Subjective: Patient seen and examined in her hospital computer chart reviewed and her case discussed with my partner Dr. Dianna and the hospital team and she is familiar to me from outpatient follow-up and her previous ERCP 3 years ago and she is currently doing well from a GI standpoint we did discuss her pain 2 weeks ago and ER visit and she has been on high-dose prednisone  and we answered all of her questions  Objective: Vital signs stable afebrile no acute distress abdomen is soft nontender liver test normal MRCP reviewed and discussed with patient white count decreased a little hemoglobin stable platelet count improved  Assessment: Liver abscesses questionable etiology  Plan: Will plan an outpatient EUS to see if ERCP is needed or not and we answered all of the patient's questions and she has had an EUS before and we will schedule that as an outpatient in the next week or 2 based on Dr. Dolan availability and she will call me sooner as needed and please call me if I can be of any further assistance this weekend and hopefully she will be able to go home soon with close ID follow-up and repeat imaging in the future [facility]  office [phone] After 5PM or if no answer call [phone]

## 2024-05-29 NOTE — Progress Notes (Signed)
 PT Cancellation Note  Patient Details Name: Laurie Terry MRN: [id number] DOB: Dec 30, 1965   Cancelled Treatment:    Reason Eval/Treat Not Completed: Other (comment) (pt reports she doesn't need PT, her pain is resolved and she's walking without difficulty. PT signing off per pt request.)  Sylvan Delon Copp PT 05/29/2024  Acute Rehabilitation Services  Office [phone]

## 2024-05-29 NOTE — Progress Notes (Signed)
 Progress Note    Laurie Terry   FMW:[id number]  DOB: 07-04-1966  DOA: 05/21/2024     7 PCP: [name], MD  Initial CC: lethargy  Hospital Course: 28 yof w/ history significant for esophageal spasm, aortic aneurysm, bicuspid aortic valve recently diagnosed recently with giant cell arteritis as well as PMR on high-dose steroid being admitted to the hospital with acute thrombocytopenia.   Recently diagnosed with GCA and PMR a month ago, saw a rheumatologist and has been on high-dose steroids. Her PMR symptoms (joint pains) resolved very rapidly, however she has continued to have low-grade headaches and vision changes. Over the last few days, she was having fever, severe lethargy, and relative hypotension with no focal symptoms, or symptoms of acute infection, no bruising or bleeding. She went to her PCP for evaluation and labs showed with platelet count 30K and referred to the ED  Found to have sepsis from Klebsiella bacteremia.  Patient was also found to have small abscesses on her liver (similar presentation back in 2023/2017?)  Assessment and plan:  Acute Thrombocytopenia Recent clinical diagnosis GCA Recent diagnosis PMR - Found to have abnormal labs and routine PCP workup for low-grade fever fatigue-in the setting of high-dose steroid for recently diagnosed GCA/PMR/autoimmune disorder. - GCA diagnosis appears to be a clinical diagnosis outpatient.  No temporal artery biopsy performed-it appears a vascular ultrasound of the temporal arteries was ordered this weekend and negative but not the gold standard - with further collateral info: known history of ocular migraines with aura's and recent headaches have been similar with aura (but slightly different) however she never had decreased vision or vision loss. She has had photosensitivity with this prolonged episode and symptoms since ~June (?prolonged complicated migraine? Instead of diagnosis of GCA especially without biopsy or  other modalities. She does state inflammatory marker was elevated at rheumatology evaluation and with presentation she was then diagnosed with GCA) - Need to ensure outpatient follow-up with rheumatology (Dr. Jon Jacob)- discussed with Dr. Jacob- that even if biopsy done and if negative would still treat.  Can try to wean steroids but if worsening headaches will need to increase/slow - Seen by hematology - signed off - Platelets slowly improving with treatment of infection - Continue to monitor  Osteitis pubis -on steroids MRI with ? Septic joint/myositis -IV NSAIDS helped IV abx  Abnormal liver MRI - IR consulted- s/p aspiration -Very similar situation in May 2023 -culture pending- gram +-- defer to ID  Klebsiella bacteremia  Severe sepsis-resolved - No obvious urinary symptoms although does report some very mild dysuria prior to admission and even some suprapubic discomfort; on exam she also has right CVA tenderness.  CT A/P also showed bilateral perinephric fat stranding concerning for pyelonephritis.  Outpatient last Friday she had urine culture performed which showed 50-100 K mixed urogenital flora.  Most likely source seems to be urinary at this time.   - Antibiotics per ID   Sinus bradycardia.  Heart rate recorded in high 20s to low 30s. Cardiology was consulted, remained asymptomatic. Per cardiology most likely due to metabolic derangements secondary to sepsis.   Abdominal pain and diarrhea.  Patient with worsening abdominal pain, had 6 episodes of watery diarrhea yesterday, just 1 since this morning, abdominal pain worsening.  No nausea or vomiting.  Patient is on antibiotics. -Less likely C. difficile but patient is high risk -Monitor  Low TSH.  Patient was found to have mildly low TSH and free T4, likely secondary to sepsis. -  Need repeat levels in 4 to 6 weeks by PCP  Normocytic anemia - No obvious bleeding but with thrombocytopenia, hemoglobin has downtrended some  since admission - Trend  Prediabetes - A1c 6.4%.  Hyperglycemia likely in setting of steroid use - Controlled for now, but if up trends further will need to start on sliding scale  Mild metabolic acidosis - Continue to trend lactic acid  Interval History:  IV Toradol  helped a lot she was able to walk    DVT prophylaxis:  Place TED hose Start: 05/26/24 1022 Place and maintain sequential compression device Start: 05/23/24 0944   Code Status:   Code Status: Full Code     Objective: Blood pressure 104/67, pulse (!) 56, temperature 98.1 F (36.7 C), temperature source Oral, resp. rate 16, height 5' 5 (1.651 m), weight 78.6 kg, SpO2 96%.     General: Appearance:     Overweight female in no acute distress     Lungs:      respirations unlabored  Heart:    Bradycardic.   MS:   All extremities are intact.   Neurologic:   Awake, alert, oriented x 3. No apparent focal neurological           defect.    Consultants:  Hematology ID cardiology    Data Reviewed: Results for orders placed or performed during the hospital encounter of 05/21/24 (from the past 24 hours)  CBC with Differential/Platelet     Status: Abnormal   Collection Time: 05/29/24  7:06 AM  Result Value Ref Range   WBC 11.0 (H) 4.0 - 10.5 K/uL   RBC 3.44 (L) 3.87 - 5.11 MIL/uL   Hemoglobin 9.6 (L) 12.0 - 15.0 g/dL   HCT 68.4 (L) 63.9 - 53.9 %   MCV 91.6 80.0 - 100.0 fL   MCH 27.9 26.0 - 34.0 pg   MCHC 30.5 30.0 - 36.0 g/dL   RDW 83.6 (H) 88.4 - 84.4 %   Platelets 136 (L) 150 - 400 K/uL   nRBC 0.2 0.0 - 0.2 %   Neutrophils Relative % 79 %   Neutro Abs 8.8 (H) 1.7 - 7.7 K/uL   Lymphocytes Relative 8 %   Lymphs Abs 0.9 0.7 - 4.0 K/uL   Monocytes Relative 4 %   Monocytes Absolute 0.4 0.1 - 1.0 K/uL   Eosinophils Relative 0 %   Eosinophils Absolute 0.0 0.0 - 0.5 K/uL   Basophils Relative 1 %   Basophils Absolute 0.1 0.0 - 0.1 K/uL   WBC Morphology See Note    Smear Review Normal platelet morphology     Immature Granulocytes 8 %   Abs Immature Granulocytes 0.92 (H) 0.00 - 0.07 K/uL   Polychromasia PRESENT   Magnesium      Status: Abnormal   Collection Time: 05/29/24  7:06 AM  Result Value Ref Range   Magnesium  2.5 (H) 1.7 - 2.4 mg/dL  Comprehensive metabolic panel with GFR     Status: Abnormal   Collection Time: 05/29/24  7:06 AM  Result Value Ref Range   Sodium 141 135 - 145 mmol/L   Potassium 3.7 3.5 - 5.1 mmol/L   Chloride 104 98 - 111 mmol/L   CO2 28 22 - 32 mmol/L   Glucose, Bld 97 70 - 99 mg/dL   BUN 12 6 - 20 mg/dL   Creatinine, Ser 9.40 0.44 - 1.00 mg/dL   Calcium 8.1 (L) 8.9 - 10.3 mg/dL   Total Protein 5.5 (L) 6.5 - 8.1 g/dL  Albumin 2.2 (L) 3.5 - 5.0 g/dL   AST 17 15 - 41 U/L   ALT 29 0 - 44 U/L   Alkaline Phosphatase 93 38 - 126 U/L   Total Bilirubin 0.7 0.0 - 1.2 mg/dL   GFR, Estimated >39 >39 mL/min   Anion gap 9 5 - 15    I have reviewed pertinent nursing notes, vitals, labs, and images as necessary. I have ordered labwork to follow up on as indicated.  I have reviewed the last notes from staff over past 24 hours. I have discussed patient's care plan and test results with nursing staff, CM/SW, and other staff as appropriate.  Time spent: Greater than 50% of the 50 minute visit was spent in counseling/coordination of care for the patient as laid out in the A&P.   LOS: 7 days   Teandra Harlan U Ravonda Brecheen, DO [facility] 05/29/2024, 12:13 PM

## 2024-05-29 NOTE — Plan of Care (Signed)
  Problem: Clinical Measurements: Goal: Respiratory complications will improve Outcome: Progressing   Problem: Activity: Goal: Risk for activity intolerance will decrease Outcome: Progressing   Problem: Pain Managment: Goal: General experience of comfort will improve and/or be controlled Outcome: Progressing

## 2024-05-29 NOTE — Progress Notes (Addendum)
 INFECTIOUS DISEASE PROGRESS NOTE Date of Admission:  05/21/2024     ID: Laurie Terry is a 58 y.o. female with  Kleb bacteremia and liver absces Principal Problem:   Thrombocytopenia (HCC) Active Problems:   Bacteremia due to Klebsiella pneumoniae   Normocytic anemia   Sinus bradycardia   Pubic bone pain   Subjective: No fever, wbc down to 11. Liver aspirate cx E faecium. She feels pretty well. Some diarrhea. No fevers, chills. No ruq abd pain .  ROS  Eleven systems are reviewed and negative except per hpi  Medications:  Antibiotics Given (last 72 hours)     Date/Time Action Medication Dose Rate   05/26/24 1913 Given  [Patient would like to take this medication after dinner, husband will bring his food in about 30 mins from now.]   atovaquone  (MEPRON ) 750 MG/5ML suspension 750 mg 750 mg    05/27/24 0942 New Bag/Given   cefTRIAXone  (ROCEPHIN ) 2 g in sodium chloride  0.9 % 100 mL IVPB 2 g 200 mL/hr   05/27/24 1621 Given   metroNIDAZOLE  (FLAGYL ) tablet 500 mg 500 mg    05/27/24 2012 Given  [late due to pt care]   atovaquone  (MEPRON ) 750 MG/5ML suspension 750 mg 750 mg    05/28/24 0800 Given   atovaquone  (MEPRON ) 750 MG/5ML suspension 750 mg 750 mg    05/28/24 1018 Given   metroNIDAZOLE  (FLAGYL ) tablet 500 mg 500 mg    05/28/24 1024 New Bag/Given   cefTRIAXone  (ROCEPHIN ) 2 g in sodium chloride  0.9 % 100 mL IVPB 2 g 200 mL/hr   05/28/24 1428 New Bag/Given   DAPTOmycin  (CUBICIN ) IVPB 700 mg/163mL premix 700 mg 200 mL/hr   05/28/24 1747 Given   atovaquone  (MEPRON ) 750 MG/5ML suspension 750 mg 750 mg    05/28/24 2112 Given   metroNIDAZOLE  (FLAGYL ) tablet 500 mg 500 mg    05/29/24 0837 Given   atovaquone  (MEPRON ) 750 MG/5ML suspension 750 mg 750 mg    05/29/24 0837 Given   metroNIDAZOLE  (FLAGYL ) tablet 500 mg 500 mg    05/29/24 1009 New Bag/Given   cefTRIAXone  (ROCEPHIN ) 2 g in sodium chloride  0.9 % 100 mL IVPB 2 g 200 mL/hr       atovaquone   750 mg Oral BID    diclofenac  Sodium  2 g Topical QID   famotidine   40 mg Oral Daily   metroNIDAZOLE   500 mg Oral Q12H   predniSONE   50 mg Oral Q breakfast   saccharomyces boulardii  250 mg Oral BID    Objective: Vital signs in last 24 hours: Temp:  [98 F (36.7 C)-98.5 F (36.9 C)] 98.5 F (36.9 C) (09/06 1350) Pulse Rate:  [56-90] 85 (09/06 1350) Resp:  [16-20] 20 (09/06 1350) BP: (97-105)/(65-71) 105/65 (09/06 1350) SpO2:  [96 %-99 %] 99 % (09/06 1350) Physical Exam  Constitutional:  oriented to person, place, and time. appears well-developed and well-nourished. No distress.  Cardiovascular: Normal rate, regular rhythm and normal heart sounds.No murmur heard.  Pulmonary/Chest: Effort normal and breath sounds normal. No respiratory distress.  has no wheezes.  Abdominal: Soft. Bowel sounds are normal.  exhibits no distension. There is no tenderness.  Lymphadenopathy: no cervical adenopathy. No axillary adenopathy Neurological: alert and oriented to person, place, and time.  Skin: Skin is warm and dry. No rash noted. No erythema.  Psychiatric: a normal mood and affect.  behavior is normal.    Lab Results Recent Labs    05/28/24 0501 05/29/24 0706  WBC 14.8*  11.0*  HGB 10.5* 9.6*  HCT 33.0* 31.5*  NA 135 141  K 3.3* 3.7  CL 99 104  CO2 26 28  BUN 10 12  CREATININE 0.53 0.59    Microbiology: Results for orders placed or performed during the hospital encounter of 05/21/24  Culture, blood (single) w Reflex to ID Panel     Status: Abnormal   Collection Time: 05/22/24  7:51 AM   Specimen: BLOOD  Result Value Ref Range Status   Specimen Description   Final    BLOOD LEFT ANTECUBITAL Performed at [facility], [address], [place], [place] [address]    Special Requests   Final    BOTTLES DRAWN AEROBIC AND ANAEROBIC Blood Culture adequate volume Performed at [facility], [address], [place], [place] [address]    Culture  Setup Time   Final     GRAM NEGATIVE RODS IN BOTH AEROBIC AND ANAEROBIC BOTTLES CRITICAL RESULT CALLED TO, READ BACK BY AND VERIFIED WITH: PHARMD L POINTDEXTER 05/22/2024 @ 2210 BY AB Performed at [facility], [address], [place], [place] [address]    Culture KLEBSIELLA PNEUMONIAE (A)  Final   Report Status 05/24/2024 FINAL  Final   Organism ID, Bacteria KLEBSIELLA PNEUMONIAE  Final      Susceptibility   Klebsiella pneumoniae - MIC*    AMPICILLIN >=32 RESISTANT Resistant     CEFAZOLIN  (NON-URINE) 2 SENSITIVE Sensitive     CEFEPIME  <=0.12 SENSITIVE Sensitive     ERTAPENEM <=0.12 SENSITIVE Sensitive     CEFTRIAXONE  <=0.25 SENSITIVE Sensitive     CIPROFLOXACIN <=0.06 SENSITIVE Sensitive     GENTAMICIN <=1 SENSITIVE Sensitive     MEROPENEM <=0.25 SENSITIVE Sensitive     TRIMETH/SULFA <=20 SENSITIVE Sensitive     AMPICILLIN/SULBACTAM 4 SENSITIVE Sensitive     PIP/TAZO Value in next row Sensitive ug/mL     <=4 SENSITIVEThis is a modified FDA-approved test that has been validated and its performance characteristics determined by the reporting laboratory.  This laboratory is certified under the Clinical Laboratory Improvement Amendments CLIA as qualified to perform high complexity clinical laboratory testing.    * KLEBSIELLA PNEUMONIAE  Blood Culture ID Panel (Reflexed)     Status: Abnormal   Collection Time: 05/22/24  7:51 AM  Result Value Ref Range Status   Enterococcus faecalis NOT DETECTED NOT DETECTED Final   Enterococcus Faecium NOT DETECTED NOT DETECTED Final   Listeria monocytogenes NOT DETECTED NOT DETECTED Final   Staphylococcus species NOT DETECTED NOT DETECTED Final   Staphylococcus aureus (BCID) NOT DETECTED NOT DETECTED Final   Staphylococcus epidermidis NOT DETECTED NOT DETECTED Final   Staphylococcus lugdunensis NOT DETECTED NOT DETECTED Final   Streptococcus species NOT DETECTED NOT DETECTED Final   Streptococcus agalactiae NOT DETECTED NOT DETECTED Final   Streptococcus pneumoniae  NOT DETECTED NOT DETECTED Final   Streptococcus pyogenes NOT DETECTED NOT DETECTED Final   A.calcoaceticus-baumannii NOT DETECTED NOT DETECTED Final   Bacteroides fragilis NOT DETECTED NOT DETECTED Final   Enterobacterales DETECTED (A) NOT DETECTED Final    Comment: Enterobacterales represent a large order of gram negative bacteria, not a single organism. CRITICAL RESULT CALLED TO, READ BACK BY AND VERIFIED WITH: PHARMD L POINTDEXTER 05/22/2024 @ 2210 BY AB    Enterobacter cloacae complex NOT DETECTED NOT DETECTED Final   Escherichia coli NOT DETECTED NOT DETECTED Final   Klebsiella aerogenes NOT DETECTED NOT DETECTED Final   Klebsiella oxytoca NOT DETECTED NOT DETECTED Final  Klebsiella pneumoniae DETECTED (A) NOT DETECTED Final    Comment: CRITICAL RESULT CALLED TO, READ BACK BY AND VERIFIED WITH: PHARMD L POINTDEXTER 05/22/2024 @ 2210 BY AB    Proteus species NOT DETECTED NOT DETECTED Final   Salmonella species NOT DETECTED NOT DETECTED Final   Serratia marcescens NOT DETECTED NOT DETECTED Final   Haemophilus influenzae NOT DETECTED NOT DETECTED Final   Neisseria meningitidis NOT DETECTED NOT DETECTED Final   Pseudomonas aeruginosa NOT DETECTED NOT DETECTED Final   Stenotrophomonas maltophilia NOT DETECTED NOT DETECTED Final   Candida albicans NOT DETECTED NOT DETECTED Final   Candida auris NOT DETECTED NOT DETECTED Final   Candida glabrata NOT DETECTED NOT DETECTED Final   Candida krusei NOT DETECTED NOT DETECTED Final   Candida parapsilosis NOT DETECTED NOT DETECTED Final   Candida tropicalis NOT DETECTED NOT DETECTED Final   Cryptococcus neoformans/gattii NOT DETECTED NOT DETECTED Final   CTX-M ESBL NOT DETECTED NOT DETECTED Final   Carbapenem resistance IMP NOT DETECTED NOT DETECTED Final   Carbapenem resistance KPC NOT DETECTED NOT DETECTED Final   Carbapenem resistance NDM NOT DETECTED NOT DETECTED Final   Carbapenem resist OXA 48 LIKE NOT DETECTED NOT DETECTED Final    Carbapenem resistance VIM NOT DETECTED NOT DETECTED Final    Comment: Performed at [facility], [address], [place], [place] [address]  MRSA Next Gen by PCR, Nasal     Status: None   Collection Time: 05/24/24  8:14 PM   Specimen: Nasal Mucosa; Nasal Swab  Result Value Ref Range Status   MRSA by PCR Next Gen NOT DETECTED NOT DETECTED Final    Comment: (NOTE) The GeneXpert MRSA Assay (FDA approved for NASAL specimens only), is one component of a comprehensive MRSA colonization surveillance program. It is not intended to diagnose MRSA infection nor to guide or monitor treatment for MRSA infections. Test performance is not FDA approved in patients less than 10 years old. Performed at [facility], [address] [place], [place] [address]   Aerobic/Anaerobic Culture w Gram Stain (surgical/deep wound)     Status: None (Preliminary result)   Collection Time: 05/27/24  3:18 PM   Specimen: Abscess  Result Value Ref Range Status   Specimen Description   Final    ABSCESS Performed at [facility], [address], [place], [place] [address]    Special Requests   Final    LIVER Performed at [facility], [address] [place], [place] [address]    Gram Stain   Final    FEW WBC PRESENT, PREDOMINANTLY PMN MODERATE GRAM POSITIVE COCCI Performed at [facility], [address], [place], [place] [address]    Culture   Final    MODERATE ENTEROCOCCUS FAECIUM SUSCEPTIBILITIES TO FOLLOW NO ANAEROBES ISOLATED; CULTURE IN PROGRESS FOR 5 DAYS    Report Status PENDING  Incomplete    Studies/Results: MR PELVIS W WO CONTRAST Result Date: 05/28/2024 CLINICAL DATA:  Pubic symphysis pain. Sepsis (Klebsiella). History of polymyalgia rheumatica and giant cell arteritis with steroid therapy. EXAM: MRI PELVIS WITHOUT AND WITH CONTRAST TECHNIQUE: Multiplanar multisequence MR imaging of the pelvis was performed both before and after  administration of intravenous contrast. CONTRAST:  8mL GADAVIST  GADOBUTROL  1 MMOL/ML IV SOLN COMPARISON:  CT pelvis 05/07/2024 FINDINGS: OSSEOUS STRUCTURES AND JOINTS: 3.4 cm left and 1.9 cm right Tarlov cysts at the S2 level. Low-grade but abnormal edema signal in both pubic bones in the pubic  symphysis. There is little in the way of associated intraosseous enhancement although there is some enhancement along the superior and inferior pubic ligaments. Subtle edema tracks along the hip adductor aponeuroses (left greater than right) as on image 28 series 10, and adductor aponeurosis tear cannot be totally excluded. MUSCULOTENDINOUS: Abnormal edema tracks within along the iliopsoas musculature, gluteus medius and minimus musculature, rectus femoris and proximal vastus musculature, and hip adductor musculature bilaterally, favoring low-grade myositis. No associated significant enhancement. No abscess observed. SACRAL PLEXUS: No impinging lesion is identified involving the sacral plexus or proximal sciatic nerves. OTHER: Mild to moderate pelvic ascites. Infiltrative subcutaneous edema laterally along both sides of the pelvis without drainable process. Incidental 8 mm left lower uterine body fibroid. IMPRESSION: 1. Low-grade but abnormal edema signal in both pubic bones in the pubic symphysis. There is little in the way of associated intraosseous enhancement although there is some enhancement along the superior and inferior pubic ligaments. Possibilities may include osteitis pubis, posttraumatic injury of the pubis, or early septic joint. 2. Subtle edema tracks along the hip adductor aponeuroses (left greater than right), and adductor aponeurosis tear cannot be totally excluded. 3. Abnormal edema tracks within along the iliopsoas musculature, gluteus medius and minimus musculature, rectus femoris and proximal vastus musculature, and hip adductor musculature bilaterally, favoring low-grade myositis. No associated  significant enhancement. No abscess observed. 4. Mild to moderate pelvic ascites. 5. Infiltrative subcutaneous edema laterally along both sides of the pelvis without drainable process. 6. Tarlov cysts at the S2 level. Electronically Signed   By: Ryan Salvage M.D.   On: 05/28/2024 10:18   US  BIOPSY (LIVER) Result Date: 05/27/2024 INDICATION: 58 year old with small hepatic lesions that are concerning for small abscesses. EXAM: Ultrasound-guided hepatic lesion/abscess aspiration MEDICATIONS: Moderate sedation ANESTHESIA/SEDATION: Moderate (conscious) sedation was employed during this procedure. A total of Versed  1 mg and fentanyl  25 mcg was administered intravenously at the order of the provider performing the procedure. Total intra-service moderate sedation time: 11 minutes. Patient's level of consciousness and vital signs were monitored continuously by radiology nurse throughout the procedure under the supervision of the provider performing the procedure. COMPLICATIONS: None immediate. PROCEDURE: Informed written consent was obtained from the patient after a thorough discussion of the procedural risks, benefits and alternatives. All questions were addressed. A timeout was performed prior to the initiation of the procedure. Liver was evaluated with ultrasound. Small hypoechoic lesion or fluid collection in the right hepatic lobe was identified and targeted. Right side of the abdomen was prepped with chlorhexidine  and sterile field was created. Skin was anesthetized using 1% lidocaine . A small incision was made. Using ultrasound guidance, an 18 gauge trocar needle was directed into this right hepatic lesion and 0.5 ml of bloody fluid was aspirated. The lesion was decompressed based on ultrasound. Needle was removed. Bandage placed over the puncture site. FINDINGS: Small hypoechoic fluid collection in the right hepatic lobe measuring approximately 1.5 cm. Bloody fluid was aspirated. This fluid collection was  decompressed after aspiration. IMPRESSION: Ultrasound-guided aspiration of a small hepatic fluid collection/abscess in the right hepatic lobe. Electronically Signed   By: Juliene Balder M.D.   On: 05/27/2024 20:25    Assessment/Plan: Assessment: 47 YF with past medical history of bicuspid aortic valve, aortic aneurysm, recently due to stress arteritis and PMR started on high-dose rest few months ago, prophylaxis with atovaqoune, Hx of intra-abdomianal (subhepatic/perinephric) abscess following ercp in 2023 SP IR drainage cx + efaecalis + yeast treated with augmentin   + fluconzole admitted  with:   #Klebsiella pneumoniae bacteremia 2/2 liver abscess #mild dysuria -PT presented with lethargy and hypotension.Plt 23 on admission.  Blood Cx + Kleb pneomo. Ucx on 8/29 + leukocytes, did not reflex to Cx -CT on 8/31 showed mild bilateral perinephric fat stranding, mildly prior stasis.  new subcentimeter hypodensity in the liver too small to characterize.  Differential includes infectious etiology.  Definitely not seen on imaging on 05/07/2024. - On review of 2023 admission, ct did not show abscess, fluid was noted on MRCP in subhepatic/perinephric space  -MRCP showed 8 scattered slightly Kuster hyperenhancing lesions in the liver lesions rim-enhancing concern for small abscesses given that were not present on imaging on 03/29/2022.  Complicated assessment of biliary tree due to pneumobilia and motion artifact.   #GCA and PMR on steroids #Thrombocytopenia -H/O following for thrombocytopenia, sps IVIG x 2 days, plt 37 -continue atovaqoune. Noted that suspect thrombocypenia 2/2 infection. She is on appropriate abx with limited improvent in plt count, thrombocytopenia likely multifactorial.    #Amox intolerance - she is not allergic and we can use it if needed but may want to avoid clauvanic acid -Although she tolerated it in 2023. Notes that she had N/V following abx the last 2 times she was on it.    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  Subtle edema tracking along the hip abductor, improving subcutaneous edema foci of the pelvis, low-grade myositis.  No drainable abscess - As there is no drainable collection, would leave a daptomycin  and ceftriaxone    Recommendations: - Anticipate prolonged course of antibiotics at least 4 weeks given ostitis, early septic joint. -Follow aspirate Cx -Cont  daptomycin  to cover for gpc -Continue ceftriaxone  and  metronidazole  -GI followig for complicated biliary tree with pneumobilia and prior intra-abdominal abscess and planning EUS in next few weeks per pt -Standard precautions   Alm SHAUNNA Needle   05/29/2024, 2:06 PM

## 2024-05-30 DIAGNOSIS — D696 Thrombocytopenia, unspecified: Secondary | ICD-10-CM | POA: Diagnosis not present

## 2024-05-30 MED ORDER — LOPERAMIDE HCL 2 MG PO CAPS: 2.0000 mg | ORAL_CAPSULE | ORAL | Status: DC | PRN

## 2024-05-30 MED ORDER — AMOXICILLIN 500 MG PO CAPS
1000.0000 mg | ORAL_CAPSULE | Freq: Three times a day (TID) | ORAL | Status: DC
  Filled 2024-05-30: qty 2

## 2024-05-30 MED ORDER — NYSTATIN 100000 UNIT/ML MT SUSP: 5.0000 mL | Freq: Four times a day (QID) | OROMUCOSAL | 0 refills | Status: DC

## 2024-05-30 MED ORDER — SACCHAROMYCES BOULARDII 250 MG PO CAPS: 250.0000 mg | ORAL_CAPSULE | Freq: Two times a day (BID) | ORAL | 0 refills | Status: AC

## 2024-05-30 MED ORDER — PREDNISONE 50 MG PO TABS: ORAL_TABLET | ORAL | 0 refills | Status: DC

## 2024-05-30 MED ORDER — CEFADROXIL 500 MG PO CAPS
1000.0000 mg | ORAL_CAPSULE | Freq: Two times a day (BID) | ORAL | Status: DC
  Administered 2024-05-30: 1000 mg via ORAL
  Filled 2024-05-30: qty 2

## 2024-05-30 MED ORDER — FAMOTIDINE 20 MG PO TABS: 40.0000 mg | ORAL_TABLET | Freq: Two times a day (BID) | ORAL | Status: DC

## 2024-05-30 MED ORDER — NYSTATIN 100000 UNIT/ML MT SUSP
5.0000 mL | Freq: Four times a day (QID) | OROMUCOSAL | Status: DC
  Administered 2024-05-30: 500000 [IU] via ORAL
  Filled 2024-05-30: qty 5

## 2024-05-30 MED ORDER — DICLOFENAC SODIUM 1 % EX GEL: 2.0000 g | Freq: Four times a day (QID) | CUTANEOUS | Status: DC

## 2024-05-30 MED ORDER — POLYETHYLENE GLYCOL 3350 17 G PO PACK: 17.0000 g | PACK | Freq: Every day | ORAL | Status: DC | PRN

## 2024-05-30 MED ORDER — CEFADROXIL 500 MG PO CAPS: 1000.0000 mg | ORAL_CAPSULE | Freq: Two times a day (BID) | ORAL | 0 refills | Status: DC

## 2024-05-30 MED ORDER — AMOXICILLIN 500 MG PO CAPS: 1000.0000 mg | ORAL_CAPSULE | Freq: Three times a day (TID) | ORAL | 0 refills | Status: DC

## 2024-05-30 NOTE — Discharge Summary (Signed)
 Physician Discharge Summary  Laurie Terry FMW:[id number] DOB: 1966/01/02 DOA: 05/21/2024  PCP: [name], MD  Admit date: 05/21/2024 Discharge date: 05/30/2024  Admitted From:  Discharge disposition: Home   Recommendations for Outpatient Follow-Up:   Close follow-up outpatient with room/PCP for titration/weaning of prednisone  - Close follow-up with ID--- Fleurette antibiotics Follow-up to ensure resolution of thrush GI follow-up for outpatient EUS-please consider Carafate  or Colestid  if diarrhea continues - Need repeat thyroid  levels in 4 to 6 weeks by PCP Cbc/cmp at next office visit   Discharge Diagnosis:   Principal Problem:   Thrombocytopenia (HCC) Active Problems:   Bacteremia due to Klebsiella pneumoniae   Normocytic anemia   Sinus bradycardia   Pubic bone pain    Discharge Condition: Improved.  Diet recommendation: Regular.  Wound care: None.  Code status: Full.   History of Present Illness:   Laurie Terry is a 58 y.o. female with medical history significant for esophageal spasm, aortic aneurysm, bicuspid aortic valve recently diagnosed with giant cell arteritis as well as PMR on high-dose steroid being admitted to the hospital with acute thrombocytopenia.  Patient states she was diagnosed with giant cell arteritis and PMR several months ago, saw a rheumatologist and has been on high-dose steroids.  Her PMR symptoms resolved very rapidly, however she has continued to have low-grade headaches and vision changes.  Over the last few days, she was having fever, severe lethargy, and relative hypotension.  She has no other focal symptoms, or symptoms of acute infection.  She denies any significant bruising or bleeding.  She went to her PCP with these complaints, and lab work was done which showed a platelet count of 30.  She was therefore sent to the ER for evaluation, where repeat CBC showed low platelets of 23,000.  She was admitted to the  hospitalist service, and has already been seen by hematology this morning.    Hospital Course by Problem:   Acute Thrombocytopenia Recent clinical diagnosis GCA Recent diagnosis PMR - Found to have abnormal labs and routine PCP workup for low-grade fever fatigue-in the setting of high-dose steroid for recently diagnosed GCA/PMR/autoimmune disorder. - GCA diagnosis appears to be a clinical diagnosis outpatient.  No temporal artery biopsy performed-it appears a vascular ultrasound of the temporal arteries was ordered this weekend and negative but not the gold standard - with further collateral info: known history of ocular migraines with aura's and recent headaches have been similar with aura (but slightly different) however she never had decreased vision or vision loss. She has had photosensitivity with this prolonged episode and symptoms since ~June (?prolonged complicated migraine? Instead of diagnosis of GCA especially without biopsy or other modalities. She does state inflammatory marker was elevated at rheumatology evaluation and with presentation she was then diagnosed with GCA) - Need to ensure outpatient follow-up with rheumatology (Dr. Jon Jacob)- discussed with Dr. Jacob- that even if biopsy done and if negative would still treat.  Started to wean steroids and did not notice an increase or change in her headaches, headaches actually improved with IV Toradol  - Seen by hematology - signed off - Platelets  improving with treatment of infection   Osteitis pubis -on steroids MRI with ? Septic joint/myositis -IV NSAIDS helped  -ID: Rec prolonged course of antibiotics at least 4 weeks given ostitis, early septic joint. Complicated decision re change to oral regimen given Kleb bacteremia and enterococcus in liver abscess and multiple drug allergies. Considered augmentin  as the kleb  is S (but R to plain amp) as is the enterococcus but she reports twice have severe NV to this and does not  want to retry. Cannot use quinolones due to aneurysm hx. Allergic to sulfa. Rec amox 1000 tid for the enterococcus and cefadroxil  1000 bid for the Kleb. It is unusual to use 2 beta lactams but at this point I see no other good oral options. Discussed monitoring for side effects Will need fu in ID clinic in 1 -2 weeks   Abnormal liver MRI - IR consulted- s/p aspiration -Very similar situation in May 2023 - IR:  Rec prolonged course of antibiotics at least 4 weeks given ostitis, early septic joint. Complicated decision re change to oral regimen given Kleb bacteremia and enterococcus in liver abscess and multiple drug allergies. Considered augmentin  as the kleb is S (but R to plain amp) as is the enterococcus but she reports twice have severe NV to this and does not want to retry. Cannot use quinolones due to aneurysm hx. Allergic to sulfa. Rec amox 1000 tid for the enterococcus and cefadroxil  1000 bid for the Kleb. It is unusual to use 2 beta lactams but at this point I see no other good oral options. Discussed monitoring for side effects Will need fu in ID clinic in 1 -2 weeks   Klebsiella bacteremia  Severe sepsis-resolved - No obvious urinary symptoms although does report some very mild dysuria prior to admission and even some suprapubic discomfort; on exam she also has right CVA tenderness.  CT A/P also showed bilateral perinephric fat stranding concerning for pyelonephritis.  Outpatient last Friday she had urine culture performed which showed 50-100 K mixed urogenital flora.  Most likely source seems to be urinary at this time.   - Antibiotics per ID-for weeks of amoxicillin  and cef     Sinus bradycardia.  Heart rate recorded in high 20s to low 30s. Cardiology was consulted, remained asymptomatic. Per cardiology most likely due to metabolic derangements secondary to sepsis.  -Resolved   Abdominal pain and diarrhea.  Patient with worsening abdominal pain, had 6 episodes of watery diarrhea  yesterday, just 1 since this morning, abdominal pain worsening.  No nausea or vomiting.  Patient is on antibiotics. -Less likely C. difficile but patient is high risk -Monitor-see above for recommendations for GI   Low TSH.  Patient was found to have mildly low TSH and free T4, likely secondary to sepsis. - Need repeat levels in 4 to 6 weeks by PCP   Normocytic anemia - No obvious bleeding but with thrombocytopenia, hemoglobin has downtrended some since admission - Trend   Prediabetes - A1c 6.4%.  Hyperglycemia likely in setting of steroid use - Dietary changes   Mild metabolic acidosis - Finally normalized      Medical Consultants:   GI ID Cardiology   Discharge Exam:   Vitals:   05/29/24 1914 05/30/24 0522  BP: 98/75 108/80  Pulse: 66 61  Resp: 17 18  Temp: 98.4 F (36.9 C) 98.5 F (36.9 C)  SpO2: 99% 97%   Vitals:   05/29/24 0448 05/29/24 1350 05/29/24 1914 05/30/24 0522  BP: 104/67 105/65 98/75 108/80  Pulse: (!) 56 85 66 61  Resp: 16 20 17 18   Temp: 98.1 F (36.7 C) 98.5 F (36.9 C) 98.4 F (36.9 C) 98.5 F (36.9 C)  TempSrc: Oral Oral Oral Oral  SpO2: 96% 99% 99% 97%  Weight:      Height:  General exam: Appears calm and comfortable.    The results of significant diagnostics from this hospitalization (including imaging, microbiology, ancillary and laboratory) are listed below for reference.     Procedures and Diagnostic Studies:   DG CHEST PORT 1 VIEW Result Date: 05/22/2024 EXAM: 1 VIEW XRAY OF THE CHEST 05/22/2024 07:44:00 AM COMPARISON: 05/07/2024 Stable left lung base linear opacities compatible with atelectasis/scar. CLINICAL HISTORY: Fever [phone]. Per chart: Patient c/o weakness x 1 week. Patient was seen by PCP today and recommended to be seen in ED for low platelet count. Patient report nausea, denies vomiting. Patient denies Chest pain and SOB. FINDINGS: LUNGS AND PLEURA: No focal pulmonary opacity. No pulmonary edema. No pleural  effusion. No pneumothorax. HEART AND MEDIASTINUM: No acute abnormality of the cardiac and mediastinal silhouettes. BONES AND SOFT TISSUES: No acute osseous abnormality. IMPRESSION: 1. No acute findings. 2. Stable left lung base linear opacities compatible with atelectasis/scar. Electronically signed by: Waddell Calk MD 05/22/2024 07:59 AM EDT RP Workstation: [id number]     Labs:   Basic Metabolic Panel: Recent Labs  Lab 05/25/24 0318 05/26/24 0327 05/27/24 0543 05/28/24 0501 05/29/24 0706  NA 137 140 139 135 141  K 3.7 3.5 3.6 3.3* 3.7  CL 109 108 104 99 104  CO2 20* 21* 25 26 28   GLUCOSE 179* 170* 90 89 97  BUN 21* 16 12 10 12   CREATININE 0.53 0.53 0.47 0.53 0.59  CALCIUM 8.0* 7.8* 7.9* 7.7* 8.1*  MG 2.3 2.2 2.4 2.2 2.5*   GFR Estimated Creatinine Clearance: 78.4 mL/min (by C-G formula based on SCr of 0.59 mg/dL). Liver Function Tests: Recent Labs  Lab 05/25/24 0318 05/26/24 0327 05/27/24 0543 05/28/24 0501 05/29/24 0706  AST <10* 10* 14* 18 17  ALT 21 20 22 26 29   ALKPHOS 82 78 82 100 93  BILITOT 0.6 0.7 1.0 1.1 0.7  PROT 6.0* 5.5* 5.6* 5.8* 5.5*  ALBUMIN 2.0* 2.0* 2.0* 2.2* 2.2*   No results for input(s): LIPASE, AMYLASE in the last 168 hours. No results for input(s): AMMONIA in the last 168 hours. Coagulation profile Recent Labs  Lab 05/27/24 0543  INR 1.0    CBC: Recent Labs  Lab 05/25/24 0318 05/26/24 0327 05/27/24 0543 05/28/24 0501 05/29/24 0706  WBC 13.2* 13.0* 14.8* 14.8* 11.0*  NEUTROABS 12.8* 10.7* 11.6* 11.3* 8.8*  HGB 10.0* 9.4* 9.9* 10.5* 9.6*  HCT 32.0* 28.5* 32.3* 33.0* 31.5*  MCV 89.1 89.3 92.0 89.4 91.6  PLT 37* 49* 80* 134* 136*   Cardiac Enzymes: Recent Labs  Lab 05/28/24 0500  CKTOTAL 31*   BNP: Invalid input(s): POCBNP CBG: No results for input(s): GLUCAP in the last 168 hours. D-Dimer No results for input(s): DDIMER in the last 72 hours. Hgb A1c No results for input(s): HGBA1C in the last 72  hours. Lipid Profile No results for input(s): CHOL, HDL, LDLCALC, TRIG, CHOLHDL, LDLDIRECT in the last 72 hours. Thyroid  function studies No results for input(s): TSH, T4TOTAL, T3FREE, THYROIDAB in the last 72 hours.  Invalid input(s): FREET3 Anemia work up No results for input(s): VITAMINB12, FOLATE, FERRITIN, TIBC, IRON, RETICCTPCT in the last 72 hours. Microbiology Recent Results (from the past 240 hours)  Culture, blood (single) w Reflex to ID Panel     Status: Abnormal   Collection Time: 05/22/24  7:51 AM   Specimen: BLOOD  Result Value Ref Range Status   Specimen Description   Final    BLOOD LEFT ANTECUBITAL Performed at [facility], [address], [place], [place] [address]    Special Requests   Final    BOTTLES DRAWN AEROBIC AND ANAEROBIC Blood Culture adequate volume Performed at [facility], [address] [place], [place] [address]    Culture  Setup Time   Final    GRAM NEGATIVE RODS IN BOTH AEROBIC AND ANAEROBIC BOTTLES CRITICAL RESULT CALLED TO, READ BACK BY AND VERIFIED WITH: PHARMD L POINTDEXTER 05/22/2024 @ 2210 BY AB Performed at [facility], [address] [place], [place] [address]    Culture KLEBSIELLA PNEUMONIAE (A)  Final   Report Status 05/24/2024 FINAL  Final   Organism ID, Bacteria KLEBSIELLA PNEUMONIAE  Final      Susceptibility   Klebsiella pneumoniae - MIC*    AMPICILLIN >=32 RESISTANT Resistant     CEFAZOLIN  (NON-URINE) 2 SENSITIVE Sensitive     CEFEPIME  <=0.12 SENSITIVE Sensitive     ERTAPENEM <=0.12 SENSITIVE Sensitive     CEFTRIAXONE  <=0.25 SENSITIVE Sensitive     CIPROFLOXACIN <=0.06 SENSITIVE Sensitive     GENTAMICIN <=1 SENSITIVE Sensitive     MEROPENEM <=0.25 SENSITIVE Sensitive     TRIMETH/SULFA <=20 SENSITIVE Sensitive     AMPICILLIN/SULBACTAM 4 SENSITIVE Sensitive     PIP/TAZO Value in next row Sensitive ug/mL     <=4 SENSITIVEThis is a modified  FDA-approved test that has been validated and its performance characteristics determined by the reporting laboratory.  This laboratory is certified under the Clinical Laboratory Improvement Amendments CLIA as qualified to perform high complexity clinical laboratory testing.    * KLEBSIELLA PNEUMONIAE  Blood Culture ID Panel (Reflexed)     Status: Abnormal   Collection Time: 05/22/24  7:51 AM  Result Value Ref Range Status   Enterococcus faecalis NOT DETECTED NOT DETECTED Final   Enterococcus Faecium NOT DETECTED NOT DETECTED Final   Listeria monocytogenes NOT DETECTED NOT DETECTED Final   Staphylococcus species NOT DETECTED NOT DETECTED Final   Staphylococcus aureus (BCID) NOT DETECTED NOT DETECTED Final   Staphylococcus epidermidis NOT DETECTED NOT DETECTED Final   Staphylococcus lugdunensis NOT DETECTED NOT DETECTED Final   Streptococcus species NOT DETECTED NOT DETECTED Final   Streptococcus agalactiae NOT DETECTED NOT DETECTED Final   Streptococcus pneumoniae NOT DETECTED NOT DETECTED Final   Streptococcus pyogenes NOT DETECTED NOT DETECTED Final   A.calcoaceticus-baumannii NOT DETECTED NOT DETECTED Final   Bacteroides fragilis NOT DETECTED NOT DETECTED Final   Enterobacterales DETECTED (A) NOT DETECTED Final    Comment: Enterobacterales represent a large order of gram negative bacteria, not a single organism. CRITICAL RESULT CALLED TO, READ BACK BY AND VERIFIED WITH: PHARMD L POINTDEXTER 05/22/2024 @ 2210 BY AB    Enterobacter cloacae complex NOT DETECTED NOT DETECTED Final   Escherichia coli NOT DETECTED NOT DETECTED Final   Klebsiella aerogenes NOT DETECTED NOT DETECTED Final   Klebsiella oxytoca NOT DETECTED NOT DETECTED Final   Klebsiella pneumoniae DETECTED (A) NOT DETECTED Final    Comment: CRITICAL RESULT CALLED TO, READ BACK BY AND VERIFIED WITH: PHARMD L POINTDEXTER 05/22/2024 @ 2210 BY AB    Proteus species NOT DETECTED NOT DETECTED Final   Salmonella species NOT DETECTED  NOT DETECTED Final   Serratia marcescens NOT DETECTED NOT DETECTED Final   Haemophilus influenzae NOT DETECTED NOT DETECTED Final   Neisseria meningitidis NOT DETECTED NOT DETECTED Final   Pseudomonas aeruginosa NOT DETECTED NOT DETECTED Final   Stenotrophomonas maltophilia NOT DETECTED NOT DETECTED Final   Candida albicans NOT DETECTED NOT DETECTED  Final   Candida auris NOT DETECTED NOT DETECTED Final   Candida glabrata NOT DETECTED NOT DETECTED Final   Candida krusei NOT DETECTED NOT DETECTED Final   Candida parapsilosis NOT DETECTED NOT DETECTED Final   Candida tropicalis NOT DETECTED NOT DETECTED Final   Cryptococcus neoformans/gattii NOT DETECTED NOT DETECTED Final   CTX-M ESBL NOT DETECTED NOT DETECTED Final   Carbapenem resistance IMP NOT DETECTED NOT DETECTED Final   Carbapenem resistance KPC NOT DETECTED NOT DETECTED Final   Carbapenem resistance NDM NOT DETECTED NOT DETECTED Final   Carbapenem resist OXA 48 LIKE NOT DETECTED NOT DETECTED Final   Carbapenem resistance VIM NOT DETECTED NOT DETECTED Final    Comment: Performed at [facility], [address] [place], [place] [address]  MRSA Next Gen by PCR, Nasal     Status: None   Collection Time: 05/24/24  8:14 PM   Specimen: Nasal Mucosa; Nasal Swab  Result Value Ref Range Status   MRSA by PCR Next Gen NOT DETECTED NOT DETECTED Final    Comment: (NOTE) The GeneXpert MRSA Assay (FDA approved for NASAL specimens only), is one component of a comprehensive MRSA colonization surveillance program. It is not intended to diagnose MRSA infection nor to guide or monitor treatment for MRSA infections. Test performance is not FDA approved in patients less than 71 years old. Performed at [facility], [address], [place], [place] [address]   Aerobic/Anaerobic Culture w Gram Stain (surgical/deep wound)     Status: None (Preliminary result)   Collection Time: 05/27/24  3:18 PM   Specimen: Abscess  Result  Value Ref Range Status   Specimen Description   Final    ABSCESS Performed at [facility], [address] [place], [place] [address]    Special Requests   Final    LIVER Performed at [facility], [address], [place], [place] [address]    Gram Stain   Final    FEW WBC PRESENT, PREDOMINANTLY PMN MODERATE GRAM POSITIVE COCCI Performed at [facility], [address], [place], [place] [address]    Culture   Final    MODERATE ENTEROCOCCUS FAECIUM NO ANAEROBES ISOLATED; CULTURE IN PROGRESS FOR 5 DAYS    Report Status PENDING  Incomplete   Organism ID, Bacteria ENTEROCOCCUS FAECIUM  Final      Susceptibility   Enterococcus faecium - MIC*    AMPICILLIN <=2 SENSITIVE Sensitive     VANCOMYCIN  <=0.5 SENSITIVE Sensitive     GENTAMICIN SYNERGY SENSITIVE Sensitive     * MODERATE ENTEROCOCCUS FAECIUM     Discharge Instructions:    Allergies as of 05/30/2024       Reactions   Contrast Media [iodinated Contrast Media] Shortness Of Breath   Low blood pressure    Iodine Shortness Of Breath, Swelling   Midodrine  Shortness Of Breath   Other Hives, Swelling   Seafood   Augmentin  [amoxicillin -pot Clavulanate] Nausea And Vomiting   Avocado Itching   Tongue and mouth   Levaquin [levofloxacin]    Not recommended in Aortic aneurysm patients.   Levofloxacin Hives   Pt already has aortic aneurysm.   Pantoprazole  Other (See Comments)   Joint aches, fever, peeling of skin   Sulfa Antibiotics Hives   Per pt   Vancomycin  Cough   Patient develops cough, rash ears, tingling lips after receiving Cefepime  and vancomycin  01/23/2022   Colestipol  Hcl Rash        Medication  List     STOP taking these medications    estradiol  0.1 MG/GM vaginal cream Commonly known as: ESTRACE    metoprolol  tartrate 25 MG tablet Commonly known as: LOPRESSOR    nitrofurantoin  (macrocrystal-monohydrate) 100 MG capsule Commonly known as: Macrobid     oxyCODONE -acetaminophen  5-325 MG tablet Commonly known as: PERCOCET/ROXICET       TAKE these medications    acetaminophen  500 MG tablet Commonly known as: TYLENOL  Take 500 mg by mouth every 6 (six) hours as needed.   Adapalene 0.3 % gel Apply 1 application  topically daily as needed.   alum & mag hydroxide-simeth 400-400-40 MG/5ML suspension Commonly known as: MAALOX PLUS Take 15 mLs by mouth every 6 (six) hours as needed for indigestion.   amoxicillin  500 MG capsule Commonly known as: AMOXIL  Take 2 capsules (1,000 mg total) by mouth every 8 (eight) hours.   atovaquone  750 MG/5ML suspension Commonly known as: MEPRON  Take 750 mg by mouth 2 (two) times daily.   cefadroxil  500 MG capsule Commonly known as: DURICEF Take 2 capsules (1,000 mg total) by mouth 2 (two) times daily.   diclofenac  Sodium 1 % Gel Commonly known as: VOLTAREN  Apply 2 g topically 4 (four) times daily.   dicyclomine  20 MG tablet Commonly known as: BENTYL  Take 1 tablet (20 mg total) by mouth 2 (two) times daily. What changed:  when to take this reasons to take this   EPINEPHrine  0.3 mg/0.3 mL Soaj injection Commonly known as: EPI-PEN Inject 0.3 mg into the muscle as needed for anaphylaxis.   famotidine  20 MG tablet Commonly known as: PEPCID  Take 20 mg by mouth daily as needed for heartburn or indigestion.   fluticasone  50 MCG/ACT nasal spray Commonly known as: FLONASE  Place 2 sprays into both nostrils daily. What changed:  when to take this reasons to take this   loperamide  2 MG capsule Commonly known as: IMODIUM  Take 1 capsule (2 mg total) by mouth every 4 (four) hours as needed for diarrhea or loose stools.   nystatin  100000 UNIT/ML suspension Commonly known as: MYCOSTATIN  Take 5 mLs (500,000 Units total) by mouth 4 (four) times daily.   polyethylene glycol 17 g packet Commonly known as: MIRALAX  / GLYCOLAX  Take 17 g by mouth daily as needed for moderate constipation.    predniSONE  50 MG tablet Commonly known as: DELTASONE  1 PO daily-- follow up with rheum/PCP For titration down Start taking on: May 31, 2024 What changed:  medication strength how much to take how to take this when to take this additional instructions   saccharomyces boulardii 250 MG capsule Commonly known as: FLORASTOR Take 1 capsule (250 mg total) by mouth 2 (two) times daily.          Time coordinating discharge: 45 min  Signed:  Harlene RAYMOND Bowl DO  [facility] 05/30/2024, 11:05 AM

## 2024-05-30 NOTE — Progress Notes (Signed)
 Laurie Terry 9:45 AM  Subjective: Patient seen while walking in the hall doing well overall hopefully will go home soon no new complaints and we discussed her diarrhea as well as her giant cell arteritis and prednisone  dose and we discussed her outpatient EUS  Objective: Vital signs stable afebrile no acute distress in good spirits not examined today  Assessment: Liver abscess concerns over questionable bile duct origin and probably some diarrhea from antibiotics  Plan: I have left a message for Dr. Burnette and his covering nurse to schedule an outpatient EUS next week and have instructed her to call her office Tuesday afternoon if she is not heard by them by then and would try probiotics first for the diarrhea and hopefully when she changed to oral she will not have that and consider a C. difficile if needed and we discussed calling my office if diarrhea is getting worse and we can get that as an outpatient as well and might try Carafate  or Colestid  if diarrhea continues and please call my rounding partner next week if any GI question or problem or if patient will not be going home soon  [facility]  office [phone] After 5PM or if no answer call [phone]

## 2024-05-30 NOTE — Plan of Care (Signed)

## 2024-05-30 NOTE — Progress Notes (Signed)
 INFECTIOUS DISEASE PROGRESS NOTE Date of Admission:  05/21/2024     ID: Laurie Terry is a 58 y.o. female with  Kleb bacteremia and liver absces Principal Problem:   Thrombocytopenia (HCC) Active Problems:   Bacteremia due to Klebsiella pneumoniae   Normocytic anemia   Sinus bradycardia   Pubic bone pain   Subjective: No fever. Liver aspirate cx E faecium. She feels pretty well. No fevers, chills. No ruq abd pain .  ROS  Eleven systems are reviewed and negative except per hpi  Medications:  Antibiotics Given (last 72 hours)     Date/Time Action Medication Dose Rate   05/27/24 1621 Given   metroNIDAZOLE  (FLAGYL ) tablet 500 mg 500 mg    05/27/24 2012 Given  [late due to pt care]   atovaquone  (MEPRON ) 750 MG/5ML suspension 750 mg 750 mg    05/28/24 0800 Given   atovaquone  (MEPRON ) 750 MG/5ML suspension 750 mg 750 mg    05/28/24 1018 Given   metroNIDAZOLE  (FLAGYL ) tablet 500 mg 500 mg    05/28/24 1024 New Bag/Given   cefTRIAXone  (ROCEPHIN ) 2 g in sodium chloride  0.9 % 100 mL IVPB 2 g 200 mL/hr   05/28/24 1428 New Bag/Given   DAPTOmycin  (CUBICIN ) IVPB 700 mg/180mL premix 700 mg 200 mL/hr   05/28/24 1747 Given   atovaquone  (MEPRON ) 750 MG/5ML suspension 750 mg 750 mg    05/28/24 2112 Given   metroNIDAZOLE  (FLAGYL ) tablet 500 mg 500 mg    05/29/24 0837 Given   atovaquone  (MEPRON ) 750 MG/5ML suspension 750 mg 750 mg    05/29/24 0837 Given   metroNIDAZOLE  (FLAGYL ) tablet 500 mg 500 mg    05/29/24 1009 New Bag/Given   cefTRIAXone  (ROCEPHIN ) 2 g in sodium chloride  0.9 % 100 mL IVPB 2 g 200 mL/hr   05/29/24 1428 New Bag/Given   DAPTOmycin  (CUBICIN ) IVPB 700 mg/148mL premix 700 mg 200 mL/hr   05/29/24 1726 Given   atovaquone  (MEPRON ) 750 MG/5ML suspension 750 mg 750 mg    05/29/24 2106 Given   metroNIDAZOLE  (FLAGYL ) tablet 500 mg 500 mg    05/30/24 0746 Given   atovaquone  (MEPRON ) 750 MG/5ML suspension 750 mg 750 mg    05/30/24 0910 Given   metroNIDAZOLE  (FLAGYL ) tablet  500 mg 500 mg    05/30/24 0914 New Bag/Given   cefTRIAXone  (ROCEPHIN ) 2 g in sodium chloride  0.9 % 100 mL IVPB 2 g 200 mL/hr       atovaquone   750 mg Oral BID   diclofenac  Sodium  2 g Topical QID   famotidine   40 mg Oral BID   metroNIDAZOLE   500 mg Oral Q12H   nystatin   5 mL Oral QID   predniSONE   50 mg Oral Q breakfast   saccharomyces boulardii  250 mg Oral BID    Objective: Vital signs in last 24 hours: Temp:  [98.4 F (36.9 C)-98.5 F (36.9 C)] 98.5 F (36.9 C) (09/07 0522) Pulse Rate:  [61-85] 61 (09/07 0522) Resp:  [17-20] 18 (09/07 0522) BP: (98-108)/(65-80) 108/80 (09/07 0522) SpO2:  [97 %-99 %] 97 % (09/07 0522) Physical Exam  Constitutional:  oriented to person, place, and time. appears well-developed and well-nourished. No distress.  Cardiovascular: Normal rate, regular rhythm and normal heart sounds.No murmur heard.  Pulmonary/Chest: Effort normal and breath sounds normal. No respiratory distress.  has no wheezes.  Abdominal: Soft. Bowel sounds are normal.  exhibits no distension. There is no tenderness.  Lymphadenopathy: no cervical adenopathy. No axillary adenopathy Neurological: alert and  oriented to person, place, and time.  Skin: Skin is warm and dry. No rash noted. No erythema.  Psychiatric: a normal mood and affect.  behavior is normal.    Lab Results Recent Labs    05/28/24 0501 05/29/24 0706  WBC 14.8* 11.0*  HGB 10.5* 9.6*  HCT 33.0* 31.5*  NA 135 141  K 3.3* 3.7  CL 99 104  CO2 26 28  BUN 10 12  CREATININE 0.53 0.59    Microbiology: Results for orders placed or performed during the hospital encounter of 05/21/24  Culture, blood (single) w Reflex to ID Panel     Status: Abnormal   Collection Time: 05/22/24  7:51 AM   Specimen: BLOOD  Result Value Ref Range Status   Specimen Description   Final    BLOOD LEFT ANTECUBITAL Performed at [facility], [address], [place], [place] [address]    Special Requests   Final     BOTTLES DRAWN AEROBIC AND ANAEROBIC Blood Culture adequate volume Performed at [facility], [address] [place], [place] [address]    Culture  Setup Time   Final    GRAM NEGATIVE RODS IN BOTH AEROBIC AND ANAEROBIC BOTTLES CRITICAL RESULT CALLED TO, READ BACK BY AND VERIFIED WITH: PHARMD L POINTDEXTER 05/22/2024 @ 2210 BY AB Performed at [facility], [address], [place], [place] [address]    Culture KLEBSIELLA PNEUMONIAE (A)  Final   Report Status 05/24/2024 FINAL  Final   Organism ID, Bacteria KLEBSIELLA PNEUMONIAE  Final      Susceptibility   Klebsiella pneumoniae - MIC*    AMPICILLIN >=32 RESISTANT Resistant     CEFAZOLIN  (NON-URINE) 2 SENSITIVE Sensitive     CEFEPIME  <=0.12 SENSITIVE Sensitive     ERTAPENEM <=0.12 SENSITIVE Sensitive     CEFTRIAXONE  <=0.25 SENSITIVE Sensitive     CIPROFLOXACIN <=0.06 SENSITIVE Sensitive     GENTAMICIN <=1 SENSITIVE Sensitive     MEROPENEM <=0.25 SENSITIVE Sensitive     TRIMETH/SULFA <=20 SENSITIVE Sensitive     AMPICILLIN/SULBACTAM 4 SENSITIVE Sensitive     PIP/TAZO Value in next row Sensitive ug/mL     <=4 SENSITIVEThis is a modified FDA-approved test that has been validated and its performance characteristics determined by the reporting laboratory.  This laboratory is certified under the Clinical Laboratory Improvement Amendments CLIA as qualified to perform high complexity clinical laboratory testing.    * KLEBSIELLA PNEUMONIAE  Blood Culture ID Panel (Reflexed)     Status: Abnormal   Collection Time: 05/22/24  7:51 AM  Result Value Ref Range Status   Enterococcus faecalis NOT DETECTED NOT DETECTED Final   Enterococcus Faecium NOT DETECTED NOT DETECTED Final   Listeria monocytogenes NOT DETECTED NOT DETECTED Final   Staphylococcus species NOT DETECTED NOT DETECTED Final   Staphylococcus aureus (BCID) NOT DETECTED NOT DETECTED Final   Staphylococcus epidermidis NOT DETECTED NOT DETECTED Final    Staphylococcus lugdunensis NOT DETECTED NOT DETECTED Final   Streptococcus species NOT DETECTED NOT DETECTED Final   Streptococcus agalactiae NOT DETECTED NOT DETECTED Final   Streptococcus pneumoniae NOT DETECTED NOT DETECTED Final   Streptococcus pyogenes NOT DETECTED NOT DETECTED Final   A.calcoaceticus-baumannii NOT DETECTED NOT DETECTED Final   Bacteroides fragilis NOT DETECTED NOT DETECTED Final   Enterobacterales DETECTED (A) NOT DETECTED Final    Comment: Enterobacterales represent a large order of gram negative bacteria, not a single organism. CRITICAL RESULT CALLED TO, READ BACK BY AND VERIFIED WITH:  PHARMD L POINTDEXTER 05/22/2024 @ 2210 BY AB    Enterobacter cloacae complex NOT DETECTED NOT DETECTED Final   Escherichia coli NOT DETECTED NOT DETECTED Final   Klebsiella aerogenes NOT DETECTED NOT DETECTED Final   Klebsiella oxytoca NOT DETECTED NOT DETECTED Final   Klebsiella pneumoniae DETECTED (A) NOT DETECTED Final    Comment: CRITICAL RESULT CALLED TO, READ BACK BY AND VERIFIED WITH: PHARMD L POINTDEXTER 05/22/2024 @ 2210 BY AB    Proteus species NOT DETECTED NOT DETECTED Final   Salmonella species NOT DETECTED NOT DETECTED Final   Serratia marcescens NOT DETECTED NOT DETECTED Final   Haemophilus influenzae NOT DETECTED NOT DETECTED Final   Neisseria meningitidis NOT DETECTED NOT DETECTED Final   Pseudomonas aeruginosa NOT DETECTED NOT DETECTED Final   Stenotrophomonas maltophilia NOT DETECTED NOT DETECTED Final   Candida albicans NOT DETECTED NOT DETECTED Final   Candida auris NOT DETECTED NOT DETECTED Final   Candida glabrata NOT DETECTED NOT DETECTED Final   Candida krusei NOT DETECTED NOT DETECTED Final   Candida parapsilosis NOT DETECTED NOT DETECTED Final   Candida tropicalis NOT DETECTED NOT DETECTED Final   Cryptococcus neoformans/gattii NOT DETECTED NOT DETECTED Final   CTX-M ESBL NOT DETECTED NOT DETECTED Final   Carbapenem resistance IMP NOT DETECTED NOT  DETECTED Final   Carbapenem resistance KPC NOT DETECTED NOT DETECTED Final   Carbapenem resistance NDM NOT DETECTED NOT DETECTED Final   Carbapenem resist OXA 48 LIKE NOT DETECTED NOT DETECTED Final   Carbapenem resistance VIM NOT DETECTED NOT DETECTED Final    Comment: Performed at [facility], [address], [place], [place] [address]  MRSA Next Gen by PCR, Nasal     Status: None   Collection Time: 05/24/24  8:14 PM   Specimen: Nasal Mucosa; Nasal Swab  Result Value Ref Range Status   MRSA by PCR Next Gen NOT DETECTED NOT DETECTED Final    Comment: (NOTE) The GeneXpert MRSA Assay (FDA approved for NASAL specimens only), is one component of a comprehensive MRSA colonization surveillance program. It is not intended to diagnose MRSA infection nor to guide or monitor treatment for MRSA infections. Test performance is not FDA approved in patients less than 42 years old. Performed at [facility], [address], [place], [place] [address]   Aerobic/Anaerobic Culture w Gram Stain (surgical/deep wound)     Status: None (Preliminary result)   Collection Time: 05/27/24  3:18 PM   Specimen: Abscess  Result Value Ref Range Status   Specimen Description   Final    ABSCESS Performed at [facility], [address], [place], [place] [address]    Special Requests   Final    LIVER Performed at [facility], [address], [place], [place] [address]    Gram Stain   Final    FEW WBC PRESENT, PREDOMINANTLY PMN MODERATE GRAM POSITIVE COCCI Performed at [facility], [address], [place], [place] [address]    Culture   Final    MODERATE ENTEROCOCCUS FAECIUM NO ANAEROBES ISOLATED; CULTURE IN PROGRESS FOR 5 DAYS    Report Status PENDING  Incomplete   Organism ID, Bacteria ENTEROCOCCUS FAECIUM  Final      Susceptibility   Enterococcus faecium - MIC*    AMPICILLIN <=2 SENSITIVE Sensitive     VANCOMYCIN  <=0.5 SENSITIVE  Sensitive     GENTAMICIN SYNERGY SENSITIVE Sensitive     * MODERATE ENTEROCOCCUS FAECIUM  Studies/Results: No results found.   Assessment/Plan: Assessment: 75 YF with past medical history of bicuspid aortic valve, aortic aneurysm, recently due to stress arteritis and PMR started on high-dose rest few months ago, prophylaxis with atovaqoune, Hx of intra-abdomianal (subhepatic/perinephric) abscess following ercp in 2023 SP IR drainage cx + efaecalis + yeast treated with augmentin   + fluconzole admitted with:   #Klebsiella pneumoniae bacteremia 2/2 liver abscess #mild dysuria -PT presented with lethargy and hypotension.Plt 23 on admission.  Blood Cx + Kleb pneomo. Ucx on 8/29 + leukocytes, did not reflex to Cx -CT on 8/31 showed mild bilateral perinephric fat stranding, mildly prior stasis.  new subcentimeter hypodensity in the liver too small to characterize.  Differential includes infectious etiology.  Definitely not seen on imaging on 05/07/2024. - On review of 2023 admission, ct did not show abscess, fluid was noted on MRCP in subhepatic/perinephric space  -MRCP showed 8 scattered slightly Kuster hyperenhancing lesions in the liver lesions rim-enhancing concern for small abscesses given that were not present on imaging on 03/29/2022.  Complicated assessment of biliary tree due to pneumobilia and motion artifact.   #GCA and PMR on steroids #Thrombocytopenia -H/O following for thrombocytopenia, sps IVIG x 2 days, plt 37 -continue atovaqoune. Noted that suspect thrombocypenia 2/2 infection. She is on appropriate abx with limited improvent in plt count, thrombocytopenia likely multifactorial.    #Amox intolerance - she is not allergic and we can use it if needed but may want to avoid clauvanic acid -Although she tolerated it in 2023. Notes that she had N/V following abx the last 2 times she was on it.    #Pelvic pain secondary to osteitis with myositis -MR of pelvis with and without contrast  showed abnormal edema pubic bone and the pubic symphysis possibly early septic joint, osteitis pubis, posttraumatic injury of the pubis.  Subtle edema tracking along the hip abductor, improving subcutaneous edema foci of the pelvis, low-grade myositis.  No drainable abscess - As there is no drainable collection, would leave a daptomycin  and ceftriaxone    Recommendations: - Rec prolonged course of antibiotics at least 4 weeks given ostitis, early septic joint. Complicated decision re change to oral regimen given Kleb bacteremia and enterococcus in liver abscess and multiple drug allergies. Considered augmentin  as the kleb is S (but R to plain amp) as is the enterococcus but she reports twice have severe NV to this and does not want to retry. Cannot use quinolones due to aneurysm hx. Allergic to sulfa.  Rec amox 1000 tid for the enterococcus and cefadroxil  1000 bid for the Kleb. It is unusual to use 2 beta lactams but at this point I see no other good oral options. Discussed monitoring for side effects Will need fu in ID clinic in 1 -2 weeks  -GI followig for complicated biliary tree with pneumobilia and prior intra-abdominal abscess and planning EUS in next few weeks per pt   Alm SHAUNNA Needle   05/30/2024, 9:54 AM

## 2024-05-31 ENCOUNTER — Other Ambulatory Visit: Payer: Self-pay | Admitting: Gastroenterology

## 2024-06-01 ENCOUNTER — Encounter (HOSPITAL_COMMUNITY): Payer: Self-pay | Admitting: Gastroenterology

## 2024-06-01 ENCOUNTER — Ambulatory Visit: Admitting: Physical Therapy

## 2024-06-01 ENCOUNTER — Telehealth: Payer: Self-pay

## 2024-06-01 LAB — AEROBIC/ANAEROBIC CULTURE W GRAM STAIN (SURGICAL/DEEP WOUND)

## 2024-06-01 NOTE — Telephone Encounter (Signed)
 Patient called stating that she is still having diarrhea up to 5 to 6 times per day. It has improved since being admitted. Unable to make it to the bathroom at times. Unsure if its related to oral abx's. Patient has been taking imodium . Wanted to know if she should continue. Informed patient I will send message back to provider but she should also reach out to her GI doctor.    Laurie Terry Laurie Terry, CMA

## 2024-06-02 ENCOUNTER — Other Ambulatory Visit: Payer: Self-pay

## 2024-06-02 ENCOUNTER — Ambulatory Visit (HOSPITAL_COMMUNITY)
Admission: RE | Admit: 2024-06-02 | Discharge: 2024-06-02 | Disposition: A | Discharge status: Home / Self Care | Attending: Gastroenterology | Admitting: Gastroenterology

## 2024-06-02 ENCOUNTER — Encounter (HOSPITAL_COMMUNITY): Payer: Self-pay | Admitting: Gastroenterology

## 2024-06-02 ENCOUNTER — Ambulatory Visit (HOSPITAL_COMMUNITY): Admitting: Anesthesiology

## 2024-06-02 ENCOUNTER — Encounter (HOSPITAL_COMMUNITY)
Admission: RE | Disposition: A | Discharge status: Home / Self Care | Payer: Self-pay | Source: Home / Self Care | Attending: Gastroenterology

## 2024-06-02 DIAGNOSIS — K219 Gastro-esophageal reflux disease without esophagitis: Secondary | ICD-10-CM | POA: Diagnosis not present

## 2024-06-02 DIAGNOSIS — R1013 Epigastric pain: Secondary | ICD-10-CM | POA: Insufficient documentation

## 2024-06-02 DIAGNOSIS — R935 Abnormal findings on diagnostic imaging of other abdominal regions, including retroperitoneum: Secondary | ICD-10-CM | POA: Diagnosis not present

## 2024-06-02 DIAGNOSIS — Z87891 Personal history of nicotine dependence: Secondary | ICD-10-CM | POA: Insufficient documentation

## 2024-06-02 DIAGNOSIS — I493 Ventricular premature depolarization: Secondary | ICD-10-CM | POA: Diagnosis not present

## 2024-06-02 DIAGNOSIS — I471 Supraventricular tachycardia, unspecified: Secondary | ICD-10-CM | POA: Diagnosis not present

## 2024-06-02 HISTORY — PX: ESOPHAGOGASTRODUODENOSCOPY: SHX5428

## 2024-06-02 HISTORY — PX: EUS: SHX5427

## 2024-06-02 SURGERY — ULTRASOUND, UPPER GI TRACT, ENDOSCOPIC
Anesthesia: Monitor Anesthesia Care

## 2024-06-02 MED ORDER — PROPOFOL 500 MG/50ML IV EMUL
INTRAVENOUS | Status: AC
  Filled 2024-06-02: qty 50

## 2024-06-02 MED ORDER — SODIUM CHLORIDE 0.9 % IV SOLN: INTRAVENOUS | Status: DC

## 2024-06-02 MED ORDER — PROPOFOL 500 MG/50ML IV EMUL
INTRAVENOUS | Status: DC | PRN
  Administered 2024-06-02: 200 ug/kg/min via INTRAVENOUS

## 2024-06-02 MED ORDER — SODIUM CHLORIDE 0.9 % IV SOLN
INTRAVENOUS | Status: AC | PRN
  Administered 2024-06-02: 500 mL via INTRAMUSCULAR

## 2024-06-02 NOTE — Anesthesia Postprocedure Evaluation (Signed)
 Anesthesia Post Note  Patient: Laurie Terry  Procedure(s) Performed: ULTRASOUND, UPPER GI TRACT, ENDOSCOPIC     Patient location during evaluation: Endoscopy Anesthesia Type: MAC Level of consciousness: awake and alert Pain management: pain level controlled Vital Signs Assessment: post-procedure vital signs reviewed and stable Respiratory status: spontaneous breathing, nonlabored ventilation, respiratory function stable and patient connected to nasal cannula oxygen Cardiovascular status: stable and blood pressure returned to baseline Postop Assessment: no apparent nausea or vomiting Anesthetic complications: no   No notable events documented.  Last Vitals:  Vitals:   06/02/24 1220 06/02/24 1224  BP: 101/60 (!) 96/59  Pulse: 68 72  Resp: 11 (!) 9  Temp:    SpO2: 100% 96%    Last Pain:  Vitals:   06/02/24 1224  TempSrc:   PainSc: 0-No pain                 Rome Ade

## 2024-06-02 NOTE — Transfer of Care (Signed)
 Immediate Anesthesia Transfer of Care Note  Patient: Laurie Terry  Procedure(s) Performed: ULTRASOUND, UPPER GI TRACT, ENDOSCOPIC  Patient Location: PACU  Anesthesia Type:MAC  Level of Consciousness: drowsy and responds to stimulation  Airway & Oxygen Therapy: Patient Spontanous Breathing  Post-op Assessment: Report given to RN  Post vital signs: Reviewed and stable  Last Vitals:  Vitals Value Taken Time  BP    Temp    Pulse    Resp    SpO2      Last Pain:  Vitals:   06/02/24 1028  TempSrc: Temporal  PainSc: 0-No pain         Complications: No notable events documented.

## 2024-06-02 NOTE — Op Note (Signed)
 [facility] Patient Name: Laurie Terry Procedure Date: 06/02/2024 MRN: [id number] Attending MD: Elsie Cree , MD, [id number] Date of Birth: 1966/03/24 CSN: [id number] Age: 58 Admit Type: Inpatient Procedure:                Upper EUS Indications:              Abnormal abdominal/pelvic CT scan, Epigastric                            abdominal pain Providers:                Elsie Cree, MD, Mliss Eagles, RN, [facility]                            [place], Technician, Carmelita Delores BONINE Referring MD:             Dr. Oliva Boots Medicines:                Monitored Anesthesia Care Complications:            No immediate complications. Estimated Blood Loss:     Estimated blood loss: none. Procedure:                Pre-Anesthesia Assessment:                           - Prior to the procedure, a History and Physical                            was performed, and patient medications and                            allergies were reviewed. The patient's tolerance of                            previous anesthesia was also reviewed. The risks                            and benefits of the procedure and the sedation                            options and risks were discussed with the patient.                            All questions were answered, and informed consent                            was obtained. Prior Anticoagulants: The patient has                            taken no anticoagulant or antiplatelet agents. ASA                            Grade Assessment: II - A patient with mild systemic  disease. After reviewing the risks and benefits,                            the patient was deemed in satisfactory condition to                            undergo the procedure.                           After obtaining informed consent, the endoscope was                            passed under direct vision. Throughout the                             procedure, the patient's blood pressure, pulse, and                            oxygen saturations were monitored continuously. The                            [id number] ([id number]) Olympus endosonoscope was                            introduced through the and advanced to the. The                            upper EUS was accomplished without difficulty. The                            patient tolerated the procedure well. Scope In: Scope Out: Findings:      ENDOSONOGRAPHIC FINDING: :      There was no sign of significant endosonographic abnormality in the       ampulla.      No peripancreatic lymphadenopathy seen.      Lots of hyperechoic material suggestive of pneumobilia was visualized in       the common bile duct. No obvious bile duct stone(s) were identified.      There was no sign of significant endosonographic abnormality in the       pancreatic head, genu of the pancreas and uncinate process of the       pancreas. Impression:               - There was no sign of significant pathology in the                            ampulla.                           - Hyperechoic material suggestive of pneumobilia                            was visualized in the common bile duct. No obvious  bile duct stones seen, but small stones could be                            missed in setting of such significant pneumobilia.                           - There was no sign of significant pathology in the                            pancreatic head, genu of the pancreas and uncinate                            process of the pancreas. Moderate Sedation:      None Recommendation:           - Discharge patient to home (via wheelchair).                           - Resume previous diet today.                           - Continue present medications.                           - Return to GI clinic at appointment to be                            scheduled. Procedure Code(s):        ---  Professional ---                           [phone], Esophagogastroduodenoscopy, flexible,                            transoral; with endoscopic ultrasound examination,                            including the esophagus, stomach, and either the                            duodenum or a surgically altered stomach where the                            jejunum is examined distal to the anastomosis Diagnosis Code(s):        --- Professional ---                           R10.13, Epigastric pain                           R93.5, Abnormal findings on diagnostic imaging of                            other abdominal regions, including retroperitoneum CPT copyright 2022 [employer]. All rights reserved. The codes documented in this report are preliminary and upon coder review may  be revised to meet current compliance requirements. Elsie Cree, MD 06/02/2024 12:08:34 PM This report has been signed electronically. Number of Addenda: 0

## 2024-06-02 NOTE — Discharge Instructions (Signed)

## 2024-06-02 NOTE — Anesthesia Preprocedure Evaluation (Signed)
 Anesthesia Evaluation  Patient identified by MRN, date of birth, ID band Patient awake    Reviewed: Allergy & Precautions, NPO status , Patient's Chart, lab work & pertinent test results  History of Anesthesia Complications Negative for: history of anesthetic complications  Airway Mallampati: II  TM Distance: >3 FB Neck ROM: Full    Dental no notable dental hx. (+) Teeth Intact   Pulmonary neg pulmonary ROS, neg sleep apnea, neg COPD, Patient abstained from smoking.Not current smoker, former smoker   Pulmonary exam normal breath sounds clear to auscultation       Cardiovascular Exercise Tolerance: Good METS(-) hypertension(-) CAD and (-) Past MI negative cardio ROS (-) dysrhythmias  Rhythm:Regular Rate:Normal - Systolic murmurs    Neuro/Psych  Headaches  negative psych ROS   GI/Hepatic ,GERD  Controlled,,(+)     (-) substance abuse    Endo/Other  neg diabetes    Renal/GU negative Renal ROS     Musculoskeletal   Abdominal   Peds  Hematology   Anesthesia Other Findings Past Medical History: No date: Allergic rhinitis 09/23/2020: Aortic aneurysm (HCC) No date: Cancer [facility])     Comment:  Melanoma 2007: Cervical dysplasia     Comment:  low-grade SIL, normal Pap smears since then No date: Family history of adverse reaction to anesthesia     Comment:  mom has h/o post op nausea/vomiting No date: Intermittent palpitations No date: IUD     Comment:  inserted 03/2002.04/06/2007, 06/03/2012 No date: Ocular migraine No date: Osteomalacia No date: Pancreatitis No date: Seafood allergy  Reproductive/Obstetrics                              Anesthesia Physical Anesthesia Plan  ASA: 2  Anesthesia Plan: MAC   Post-op Pain Management: Minimal or no pain anticipated   Induction: Intravenous  PONV Risk Score and Plan: 2 and Propofol  infusion, TIVA and Ondansetron   Airway Management Planned:  Nasal Cannula  Additional Equipment: None  Intra-op Plan:   Post-operative Plan:   Informed Consent: I have reviewed the patients History and Physical, chart, labs and discussed the procedure including the risks, benefits and alternatives for the proposed anesthesia with the patient or authorized representative who has indicated his/her understanding and acceptance.     Dental advisory given  Plan Discussed with: CRNA and Surgeon  Anesthesia Plan Comments: (Discussed risks of anesthesia with patient, including possibility of difficulty with spontaneous ventilation under anesthesia necessitating airway intervention, PONV, and rare risks such as cardiac or respiratory or neurological events, and allergic reactions. Discussed the role of CRNA in patient's perioperative care. Patient understands.)        Anesthesia Quick Evaluation

## 2024-06-02 NOTE — Interval H&P Note (Signed)
 History and Physical Interval Note:  06/02/2024 11:32 AM  Laurie Terry  has presented today for surgery, with the diagnosis of abdominal pain, elevated lft's, rule out cbd stones.  The various methods of treatment have been discussed with the patient and family. After consideration of risks, benefits and other options for treatment, the patient has consented to  Procedure(s): ULTRASOUND, UPPER GI TRACT, ENDOSCOPIC (N/A) as a surgical intervention.  The patient's history has been reviewed, patient examined, no change in status, stable for surgery.  I have reviewed the patient's chart and labs.  Questions were answered to the patient's satisfaction.     [name]

## 2024-06-06 ENCOUNTER — Other Ambulatory Visit: Payer: Self-pay

## 2024-06-06 ENCOUNTER — Inpatient Hospital Stay (HOSPITAL_BASED_OUTPATIENT_CLINIC_OR_DEPARTMENT_OTHER)
Admission: EM | Admit: 2024-06-06 | Discharge: 2024-06-14 | DRG: 871 | Disposition: A | Discharge status: Home Health | Attending: Internal Medicine | Admitting: Internal Medicine

## 2024-06-06 ENCOUNTER — Encounter (HOSPITAL_BASED_OUTPATIENT_CLINIC_OR_DEPARTMENT_OTHER): Payer: Self-pay

## 2024-06-06 ENCOUNTER — Emergency Department (HOSPITAL_BASED_OUTPATIENT_CLINIC_OR_DEPARTMENT_OTHER)

## 2024-06-06 DIAGNOSIS — M353 Polymyalgia rheumatica: Secondary | ICD-10-CM

## 2024-06-06 DIAGNOSIS — I340 Nonrheumatic mitral (valve) insufficiency: Secondary | ICD-10-CM | POA: Diagnosis not present

## 2024-06-06 DIAGNOSIS — Z7952 Long term (current) use of systemic steroids: Secondary | ICD-10-CM

## 2024-06-06 DIAGNOSIS — Z882 Allergy status to sulfonamides status: Secondary | ICD-10-CM | POA: Diagnosis not present

## 2024-06-06 DIAGNOSIS — I77810 Thoracic aortic ectasia: Secondary | ICD-10-CM | POA: Diagnosis present

## 2024-06-06 DIAGNOSIS — K5900 Constipation, unspecified: Secondary | ICD-10-CM | POA: Diagnosis present

## 2024-06-06 DIAGNOSIS — B379 Candidiasis, unspecified: Secondary | ICD-10-CM | POA: Diagnosis present

## 2024-06-06 DIAGNOSIS — M315 Giant cell arteritis with polymyalgia rheumatica: Secondary | ICD-10-CM | POA: Diagnosis present

## 2024-06-06 DIAGNOSIS — D696 Thrombocytopenia, unspecified: Secondary | ICD-10-CM | POA: Diagnosis present

## 2024-06-06 DIAGNOSIS — B961 Klebsiella pneumoniae [K. pneumoniae] as the cause of diseases classified elsewhere: Secondary | ICD-10-CM | POA: Diagnosis present

## 2024-06-06 DIAGNOSIS — E872 Acidosis, unspecified: Secondary | ICD-10-CM | POA: Diagnosis present

## 2024-06-06 DIAGNOSIS — Z803 Family history of malignant neoplasm of breast: Secondary | ICD-10-CM

## 2024-06-06 DIAGNOSIS — E785 Hyperlipidemia, unspecified: Secondary | ICD-10-CM | POA: Diagnosis present

## 2024-06-06 DIAGNOSIS — I76 Septic arterial embolism: Secondary | ICD-10-CM | POA: Diagnosis present

## 2024-06-06 DIAGNOSIS — Z881 Allergy status to other antibiotic agents status: Secondary | ICD-10-CM | POA: Diagnosis not present

## 2024-06-06 DIAGNOSIS — K750 Abscess of liver: Secondary | ICD-10-CM | POA: Diagnosis present

## 2024-06-06 DIAGNOSIS — R5383 Other fatigue: Secondary | ICD-10-CM | POA: Diagnosis present

## 2024-06-06 DIAGNOSIS — Z880 Allergy status to penicillin: Secondary | ICD-10-CM

## 2024-06-06 DIAGNOSIS — Z91041 Radiographic dye allergy status: Secondary | ICD-10-CM

## 2024-06-06 DIAGNOSIS — K2970 Gastritis, unspecified, without bleeding: Secondary | ICD-10-CM | POA: Diagnosis present

## 2024-06-06 DIAGNOSIS — D649 Anemia, unspecified: Secondary | ICD-10-CM | POA: Diagnosis present

## 2024-06-06 DIAGNOSIS — Z87891 Personal history of nicotine dependence: Secondary | ICD-10-CM | POA: Diagnosis not present

## 2024-06-06 DIAGNOSIS — E860 Dehydration: Secondary | ICD-10-CM | POA: Diagnosis present

## 2024-06-06 DIAGNOSIS — I2690 Septic pulmonary embolism without acute cor pulmonale: Secondary | ICD-10-CM | POA: Diagnosis present

## 2024-06-06 DIAGNOSIS — R932 Abnormal findings on diagnostic imaging of liver and biliary tract: Secondary | ICD-10-CM | POA: Diagnosis not present

## 2024-06-06 DIAGNOSIS — Z91013 Allergy to seafood: Secondary | ICD-10-CM

## 2024-06-06 DIAGNOSIS — A419 Sepsis, unspecified organism: Principal | ICD-10-CM

## 2024-06-06 DIAGNOSIS — Z8261 Family history of arthritis: Secondary | ICD-10-CM

## 2024-06-06 DIAGNOSIS — Q2112 Patent foramen ovale: Secondary | ICD-10-CM | POA: Diagnosis not present

## 2024-06-06 DIAGNOSIS — J188 Other pneumonia, unspecified organism: Secondary | ICD-10-CM | POA: Diagnosis present

## 2024-06-06 DIAGNOSIS — I330 Acute and subacute infective endocarditis: Secondary | ICD-10-CM | POA: Diagnosis present

## 2024-06-06 DIAGNOSIS — Z8349 Family history of other endocrine, nutritional and metabolic diseases: Secondary | ICD-10-CM

## 2024-06-06 DIAGNOSIS — R6520 Severe sepsis without septic shock: Secondary | ICD-10-CM | POA: Diagnosis present

## 2024-06-06 DIAGNOSIS — R7881 Bacteremia: Secondary | ICD-10-CM | POA: Diagnosis not present

## 2024-06-06 DIAGNOSIS — B965 Pseudomonas (aeruginosa) (mallei) (pseudomallei) as the cause of diseases classified elsewhere: Secondary | ICD-10-CM | POA: Diagnosis not present

## 2024-06-06 DIAGNOSIS — M316 Other giant cell arteritis: Secondary | ICD-10-CM | POA: Insufficient documentation

## 2024-06-06 DIAGNOSIS — B952 Enterococcus as the cause of diseases classified elsewhere: Secondary | ICD-10-CM | POA: Diagnosis present

## 2024-06-06 DIAGNOSIS — Z85820 Personal history of malignant melanoma of skin: Secondary | ICD-10-CM

## 2024-06-06 DIAGNOSIS — A4152 Sepsis due to Pseudomonas: Secondary | ICD-10-CM | POA: Diagnosis present

## 2024-06-06 DIAGNOSIS — Z888 Allergy status to other drugs, medicaments and biological substances status: Secondary | ICD-10-CM

## 2024-06-06 DIAGNOSIS — Z79899 Other long term (current) drug therapy: Secondary | ICD-10-CM

## 2024-06-06 DIAGNOSIS — I959 Hypotension, unspecified: Secondary | ICD-10-CM | POA: Diagnosis present

## 2024-06-06 DIAGNOSIS — R197 Diarrhea, unspecified: Secondary | ICD-10-CM | POA: Diagnosis present

## 2024-06-06 DIAGNOSIS — I491 Atrial premature depolarization: Secondary | ICD-10-CM | POA: Diagnosis not present

## 2024-06-06 DIAGNOSIS — Z823 Family history of stroke: Secondary | ICD-10-CM

## 2024-06-06 LAB — COMPREHENSIVE METABOLIC PANEL WITH GFR
ALT: 44 U/L (ref 0–44)
AST: 25 U/L (ref 15–41)
Albumin: 3.3 g/dL — ABNORMAL LOW (ref 3.5–5.0)
Alkaline Phosphatase: 144 U/L — ABNORMAL HIGH (ref 38–126)
Anion gap: 16 — ABNORMAL HIGH (ref 5–15)
BUN: 15 mg/dL (ref 6–20)
CO2: 24 mmol/L (ref 22–32)
Calcium: 9 mg/dL (ref 8.9–10.3)
Chloride: 96 mmol/L — ABNORMAL LOW (ref 98–111)
Creatinine, Ser: 0.8 mg/dL (ref 0.44–1.00)
GFR, Estimated: >60 mL/min (ref >60)
Glucose, Bld: 192 mg/dL — ABNORMAL HIGH (ref 70–99)
Potassium: 3.5 mmol/L (ref 3.5–5.1)
Sodium: 136 mmol/L (ref 135–145)
Total Bilirubin: 0.7 mg/dL (ref 0.0–1.2)
Total Protein: 7.1 g/dL (ref 6.5–8.1)

## 2024-06-06 LAB — CBC
HCT: 33.7 % — ABNORMAL LOW (ref 36.0–46.0)
Hemoglobin: 11.2 g/dL — ABNORMAL LOW (ref 12.0–15.0)
MCH: 30 pg (ref 26.0–34.0)
MCHC: 33.2 g/dL (ref 30.0–36.0)
MCV: 90.3 fL (ref 80.0–100.0)
Platelets: 298 K/uL (ref 150–400)
RBC: 3.73 MIL/uL — ABNORMAL LOW (ref 3.87–5.11)
RDW: 17.9 % — ABNORMAL HIGH (ref 11.5–15.5)
WBC: 17.5 K/uL — ABNORMAL HIGH (ref 4.0–10.5)
nRBC: 0.2 % (ref 0.0–0.2)

## 2024-06-06 LAB — URINALYSIS, ROUTINE W REFLEX MICROSCOPIC
Bilirubin Urine: NEGATIVE
Glucose, UA: NEGATIVE mg/dL
Hgb urine dipstick: NEGATIVE
Ketones, ur: NEGATIVE mg/dL
Leukocytes,Ua: NEGATIVE
Nitrite: NEGATIVE
RBC / HPF: >50 RBC/hpf (ref 0–5)
Specific Gravity, Urine: 1.023 (ref 1.005–1.030)
pH: 6.5 (ref 5.0–8.0)

## 2024-06-06 LAB — LIPASE, BLOOD: Lipase: 28 U/L (ref 11–51)

## 2024-06-06 LAB — LACTIC ACID, PLASMA
Lactic Acid, Venous: 3.8 mmol/L (ref 0.5–1.9)
Lactic Acid, Venous: 2.9 mmol/L (ref 0.5–1.9)

## 2024-06-06 MED ORDER — PREDNISONE 50 MG PO TABS
50.0000 mg | ORAL_TABLET | Freq: Every day | ORAL | Status: DC
Start: 2024-06-07 — End: 2024-06-08
  Administered 2024-06-07 – 2024-06-08 (×2): 50 mg via ORAL
  Filled 2024-06-06 (×2): qty 1

## 2024-06-06 MED ORDER — FIDAXOMICIN 200 MG PO TABS: 200.0000 mg | ORAL_TABLET | Freq: Two times a day (BID) | ORAL | Status: DC

## 2024-06-06 MED ORDER — FIDAXOMICIN 200 MG PO TABS
200.0000 mg | ORAL_TABLET | Freq: Two times a day (BID) | ORAL | Status: DC
  Filled 2024-06-06: qty 1

## 2024-06-06 MED ORDER — ATOVAQUONE 750 MG/5ML PO SUSP
750.0000 mg | Freq: Two times a day (BID) | ORAL | Status: DC
  Administered 2024-06-06 – 2024-06-14 (×15): 750 mg via ORAL
  Filled 2024-06-06 (×17): qty 5

## 2024-06-06 MED ORDER — SODIUM CHLORIDE 0.9 % IV SOLN
2.0000 g | Freq: Once | INTRAVENOUS | Status: AC
  Administered 2024-06-06: 2 g via INTRAVENOUS
  Filled 2024-06-06: qty 20

## 2024-06-06 MED ORDER — ACETAMINOPHEN 325 MG PO TABS
650.0000 mg | ORAL_TABLET | Freq: Four times a day (QID) | ORAL | Status: DC | PRN
  Administered 2024-06-14: 650 mg via ORAL
  Filled 2024-06-06: qty 2

## 2024-06-06 MED ORDER — LACTATED RINGERS IV BOLUS
1000.0000 mL | Freq: Once | INTRAVENOUS | Status: AC
  Administered 2024-06-06: 1000 mL via INTRAVENOUS

## 2024-06-06 MED ORDER — LOPERAMIDE HCL 2 MG PO CAPS: 2.0000 mg | ORAL_CAPSULE | ORAL | Status: DC | PRN

## 2024-06-06 MED ORDER — LACTATED RINGERS IV SOLN
INTRAVENOUS | Status: AC
Start: 2024-06-06 — End: 2024-06-07

## 2024-06-06 MED ORDER — SACCHAROMYCES BOULARDII 250 MG PO CAPS
250.0000 mg | ORAL_CAPSULE | Freq: Two times a day (BID) | ORAL | Status: DC
  Administered 2024-06-06 – 2024-06-14 (×16): 250 mg via ORAL
  Filled 2024-06-06 (×16): qty 1

## 2024-06-06 MED ORDER — DICYCLOMINE HCL 10 MG PO CAPS: 20.0000 mg | ORAL_CAPSULE | Freq: Two times a day (BID) | ORAL | Status: DC | PRN

## 2024-06-06 MED ORDER — SODIUM CHLORIDE 0.9 % IV SOLN: 2.0000 g | INTRAVENOUS | Status: DC

## 2024-06-06 MED ORDER — LACTATED RINGERS IV BOLUS
500.0000 mL | Freq: Once | INTRAVENOUS | Status: AC
  Administered 2024-06-06: 500 mL via INTRAVENOUS

## 2024-06-06 MED ORDER — DAPTOMYCIN-SODIUM CHLORIDE 700-0.9 MG/100ML-% IV SOLN
700.0000 mg | Freq: Once | INTRAVENOUS | Status: AC
  Administered 2024-06-07: 700 mg via INTRAVENOUS
  Filled 2024-06-06: qty 100

## 2024-06-06 MED ORDER — ONDANSETRON HCL 4 MG/2ML IJ SOLN
4.0000 mg | Freq: Four times a day (QID) | INTRAMUSCULAR | Status: DC | PRN
  Administered 2024-06-09: 4 mg via INTRAVENOUS

## 2024-06-06 MED ORDER — SODIUM CHLORIDE 0.9% FLUSH
3.0000 mL | Freq: Two times a day (BID) | INTRAVENOUS | Status: DC
  Administered 2024-06-06 – 2024-06-14 (×16): 3 mL via INTRAVENOUS

## 2024-06-06 MED ORDER — FAMOTIDINE 20 MG PO TABS
40.0000 mg | ORAL_TABLET | Freq: Two times a day (BID) | ORAL | Status: DC
  Administered 2024-06-06 – 2024-06-14 (×16): 40 mg via ORAL
  Filled 2024-06-06 (×16): qty 2

## 2024-06-06 MED ORDER — DAPTOMYCIN-SODIUM CHLORIDE 700-0.9 MG/100ML-% IV SOLN
700.0000 mg | Freq: Once | INTRAVENOUS | Status: DC
Start: 2024-06-06 — End: 2024-06-06
  Filled 2024-06-06: qty 100

## 2024-06-06 MED ORDER — ONDANSETRON HCL 4 MG PO TABS: 4.0000 mg | ORAL_TABLET | Freq: Four times a day (QID) | ORAL | Status: DC | PRN

## 2024-06-06 MED ORDER — ACETAMINOPHEN 650 MG RE SUPP: 650.0000 mg | Freq: Four times a day (QID) | RECTAL | Status: DC | PRN

## 2024-06-06 MED ORDER — DAPTOMYCIN-SODIUM CHLORIDE 500-0.9 MG/50ML-% IV SOLN: 500.0000 mg | Freq: Every day | INTRAVENOUS | Status: DC

## 2024-06-06 MED ORDER — POLYETHYLENE GLYCOL 3350 17 G PO PACK: 17.0000 g | PACK | Freq: Every day | ORAL | Status: DC | PRN

## 2024-06-06 MED ORDER — COLESTIPOL HCL 1 G PO TABS: 1.0000 g | ORAL_TABLET | Freq: Two times a day (BID) | ORAL | Status: DC

## 2024-06-06 MED ORDER — ENOXAPARIN SODIUM 40 MG/0.4ML IJ SOSY
40.0000 mg | PREFILLED_SYRINGE | INTRAMUSCULAR | Status: DC
  Administered 2024-06-07: 40 mg via SUBCUTANEOUS
  Filled 2024-06-06 (×4): qty 0.4

## 2024-06-06 NOTE — ED Triage Notes (Addendum)
 Arrives POV with complaints of fatigue and hypotension x3 days. Patient also reports a hospital admission for sepsis (still on antibiotics) and 3 weeks of diarrhea. Went to a walk-in clinic and noted to be hypotensive (80/55). Sent here for further eval.  Negative C. Diff test last week.

## 2024-06-06 NOTE — Progress Notes (Signed)
 ED Pharmacy Antibiotic Sign Off An antibiotic consult was received from an ED provider for Daptomycin  per pharmacy dosing for sepsis. A chart review was completed to assess appropriateness. Pt with recent hx of complicated bacteremia 2/2 liver abscess with kleb pneumo and enterococcus seen by ID at time and plan was for PO abx and ID followup outpt, further doses will require ID consult per hospital policy   The following one time order(s) were placed:  Daptomycin  700 mg IV x 1  Further antibiotic and/or antibiotic pharmacy consults should be ordered by the admitting provider if indicated.   Thank you for allowing pharmacy to be a part of this patient's care.   Dorn Poot, [facility]  Clinical Pharmacist 06/06/24 1:07 PM

## 2024-06-06 NOTE — ED Notes (Signed)
 Called [facility] to transport the patient to [facility] rm# [phone]

## 2024-06-06 NOTE — ED Notes (Signed)
 [place] DO made aware of pts critical lactic 2.9

## 2024-06-06 NOTE — H&P (Signed)
 History and Physical    Laurie Terry FMW:[id number] DOB: May 11, 1966 DOA: 06/06/2024  PCP: [name], MD  Patient coming from: Home  I have personally briefly reviewed patient's old medical records in [facility]  Chief Complaint: Lightheadedness, low blood pressure  HPI: Laurie Terry is a 58 y.o. female with medical history significant for recent clinically diagnosed giant cell arteritis and PMR on prednisone , recent Klebsiella pneumonia bacteremia, Enterococcus liver abscess, osteitis pubis who presented to the ED for evaluation of low blood pressure.  Patient was recently admitted from 05/21/24-05/30/24 initially for acute thrombocytopenia which ultimately was felt due to infection.  She did receive 2 days IVIG.  Further workup revealed that patient had Klebsiella bacteremia and liver abscesses which grew Enterococcus faecalis on aspirate.  Imaging also showed osteitis pubis with question of septic joint/myositis.  ID were involved and patient was treated with daptomycin , ceftriaxone , and Flagyl  while in the hospital.  She was discharged on oral regimen of amoxicillin  and cefadroxil  for total 4-week course.  On 06/02/2024 patient underwent EUS for evaluation of pneumobilia which was visualized in the CBD.  No obvious bile duct stones were seen.  Patient states that she was having some diarrhea a week ago and had a negative C. difficile test with her GI team.  She says she was started on colestipol  yesterday and her stools are now soft.  She has not really had abdominal pain but does have some noticeable right upper quadrant abnormal sensation at the area of her recent liver aspirate site.  She has not had fevers, chills, diaphoresis.  Over the weekend she has had episodes where she felt lightheaded and seeing spots in her vision.  During these episodes she has had some shortness of breath with infrequent dry cough.  She was seen in the [facility] walk-in clinic earlier today  and was noted to have a low blood pressure of 80/40 and was sent to the ED for further evaluation.  Patient states that she has been taking the oral antibiotics as prescribed as well as a prednisone  and atovaquone  as prescribed by her rheumatologist [name].  [facility] ED Course  Labs/Imaging on admission: I have personally reviewed following labs and imaging studies.  Initial vitals showed BP 100/67, pulse 105, RR 20, temp 98.5 F, SpO2 100% on room air.  Labs showed WBC 17.5, hemoglobin 11.2, platelets 298, sodium 136, potassium 3.5, bicarb 24, BUN 15, creatinine 0.80, serum glucose 192, AST 25, LT 44, alk phos 144, total bilirubin 0.7, lipase 28.  Blood cultures in process.  Lactic acid 3.8 > 2.9.  CT chest/abdomen/pelvis without contrast IMPRESSION: 1. Several scattered lung nodules are new compared to prior 05/07/2024 exam with the larger nodules being cavitary, favoring multifocal cavitary pneumonia or septic emboli. 2. Several scattered hypodense hepatic lesions are observed including an index 1.9 by 1.7 cm lesion in the right hepatic lobe which formerly measured 1.6 by 1.4 cm on the MRI from 05/26/2024. These lesions previously demonstrated mild rim enhancement concerning for abscesses. 3. Prominent stool throughout the colon favors constipation. 4. Pneumobilia, as shown on 05/07/2024. 5. Previous pelvic ascites has resolved.  Patient was given 3.5 L LR, IV daptomycin  and ceftriaxone , oral Dificid .  It appears a consult to ID was placed however at time of this writing there is no documentation regarding any discussion that may have taken place.  The hospitalist service was consulted for transfer and admission to [facility].  Review of Systems:  All systems reviewed and are negative except as documented in history of present illness above.   Past Medical History:  Diagnosis Date   Allergic rhinitis    Aortic aneurysm (HCC) 09/23/2020   Cancer (HCC)     Melanoma   Cervical dysplasia 2007   low-grade SIL, normal Pap smears since then   Family history of adverse reaction to anesthesia    mom has h/o post op nausea/vomiting   Intermittent palpitations    IUD    inserted 03/2002.04/06/2007, 06/03/2012   Ocular migraine    Osteomalacia    Pancreatitis    Seafood allergy     Past Surgical History:  Procedure Laterality Date   BIOPSY  01/21/2022   Procedure: BIOPSY;  Surgeon: Dianna Specking, MD;  Location: [facility];  Service: Gastroenterology;;   ROMAYNE  2018   CHOLECYSTECTOMY N/A 04/09/2016   Procedure: LAPAROSCOPIC CHOLECYSTECTOMY ;  Surgeon: Donnice Bury, MD;  Location: [facility];  Service: General;  Laterality: N/A;   ERCP  04/11/2016   ERCP with pancreatic stent and failed CBD stent after needle knife and conventional sphincterotomy   ERCP N/A 04/12/2016   Procedure: ENDOSCOPIC RETROGRADE CHOLANGIOPANCREATOGRAPHY (ERCP);  Surgeon: Oliva Boots, MD;  Location: [facility] END[facility]COPY;  Service: Endoscopy;  Laterality: N/A;   ERCP N/A 01/15/2022   Procedure: ENDOSCOPIC RETROGRADE CHOLANGIOPANCREATOGRAPHY (ERCP);  Surgeon: Boots Oliva, MD;  Location: [facility];  Service: Gastroenterology;  Laterality: N/A;   ESOPHAGOGASTRODUODENOSCOPY N/A 06/02/2024   Procedure: EGD (ESOPHAGOGASTRODUODENOSCOPY);  Surgeon: Burnette Fallow, MD;  Location: [facility];  Service: Gastroenterology;  Laterality: N/A;   EUS N/A 06/02/2024   Procedure: ULTRASOUND, UPPER GI TRACT, ENDOSCOPIC;  Surgeon: Burnette Fallow, MD;  Location: [facility];  Service: Gastroenterology;  Laterality: N/A;   FLEXIBLE SIGMOIDOSCOPY N/A 01/21/2022   Procedure: FLEXIBLE SIGMOIDOSCOPY;  Surgeon: Dianna Specking, MD;  Location: [facility];  Service: Gastroenterology;  Laterality: N/A;   FOOT SURGERY     toenail   INTRAUTERINE DEVICE INSERTION  08/07/2017   Mirena    IR EXCHANGE BILIARY DRAIN  02/11/2022   IR RADIOLOGIST EVAL & MGMT  02/08/2022   IR RADIOLOGIST EVAL & MGMT   02/20/2022   IR RADIOLOGIST EVAL & MGMT  02/28/2022   removal of melanoma     REMOVAL OF STONES  01/15/2022   Procedure: REMOVAL OF STONES;  Surgeon: Boots Oliva, MD;  Location: [facility];  Service: Gastroenterology;;   ANNETT  01/15/2022   Procedure: SPHINCTEROTOMY;  Surgeon: Boots Oliva, MD;  Location: [facility];  Service: Gastroenterology;;    Social History: Social History   Tobacco Use   Smoking status: Former    Passive exposure: Past   Smokeless tobacco: Never  Vaping Use   Vaping status: Never Used  Substance Use Topics   Alcohol use: Yes   Drug use: No   Allergies  Allergen Reactions   Contrast Media [Iodinated Contrast Media] Shortness Of Breath    Low blood pressure    Iodine Shortness Of Breath and Swelling   Midodrine  Shortness Of Breath   Other Hives and Swelling    Seafood   Augmentin  [Amoxicillin -Pot Clavulanate] Nausea And Vomiting   Avocado Itching    Tongue and mouth   Levaquin [Levofloxacin]     Not recommended in Aortic aneurysm patients.   Levofloxacin Hives    Pt already has aortic aneurysm.   Pantoprazole  Other (See Comments)    Joint aches, fever, peeling of skin   Sulfa Antibiotics Hives    Per pt   Vancomycin   Cough    Patient develops cough, rash ears, tingling lips after receiving Cefepime  and vancomycin  01/23/2022   Colestipol  Hcl Rash    Family History  Problem Relation Age of Onset   Tremor Mother    Stroke Father    Autoimmune disease Father    CVA Father    Breast cancer Sister 4       3X   Arthritis Sister        Psoriatic   Thyroid  disease Sister    Breast cancer Maternal Grandmother        Age 20   Thyroid  disease Daughter        Hypo   Uterine cancer Neg Hx    Bladder Cancer Neg Hx    Renal cancer Neg Hx      Prior to Admission medications   Medication Sig Start Date End Date Taking? Authorizing Provider  acetaminophen  (TYLENOL ) 500 MG tablet Take 500 mg by mouth every 6 (six) hours as needed.     [provider]  Adapalene 0.3 % gel Apply 1 application  topically daily as needed. 04/29/22   [provider]  alum & mag hydroxide-simeth (MAALOX PLUS) 400-400-40 MG/5ML suspension Take 15 mLs by mouth every 6 (six) hours as needed for indigestion. 05/07/24   [name], MD  amoxicillin  (AMOXIL ) 500 MG capsule Take 2 capsules (1,000 mg total) by mouth every 8 (eight) hours. 05/30/24   Vann, Jessica U, DO  atovaquone  (MEPRON ) 750 MG/5ML suspension Take 750 mg by mouth 2 (two) times daily.    [provider]  cefadroxil  (DURICEF) 500 MG capsule Take 2 capsules (1,000 mg total) by mouth 2 (two) times daily. 05/30/24   Vann, Jessica U, DO  diclofenac  Sodium (VOLTAREN ) 1 % GEL Apply 2 g topically 4 (four) times daily. 05/30/24   Vann, Jessica U, DO  dicyclomine  (BENTYL ) 20 MG tablet Take 1 tablet (20 mg total) by mouth 2 (two) times daily. Patient taking differently: Take 20 mg by mouth 2 (two) times daily as needed. 05/07/24   [name], MD  EPINEPHrine  0.3 mg/0.3 mL IJ SOAJ injection Inject 0.3 mg into the muscle as needed for anaphylaxis. 10/08/22   Tobie Arleta SQUIBB, MD  famotidine  (PEPCID ) 20 MG tablet Take 20 mg by mouth daily as needed for heartburn or indigestion.    [provider]  fluticasone  (FLONASE ) 50 MCG/ACT nasal spray Place 2 sprays into both nostrils daily. Patient taking differently: Place 2 sprays into both nostrils daily as needed. 10/08/22   Tobie Arleta SQUIBB, MD  loperamide  (IMODIUM ) 2 MG capsule Take 1 capsule (2 mg total) by mouth every 4 (four) hours as needed for diarrhea or loose stools. 05/30/24   Vann, Jessica U, DO  nystatin  (MYCOSTATIN ) 100000 UNIT/ML suspension Take 5 mLs (500,000 Units total) by mouth 4 (four) times daily for 13 days. 05/30/24 06/12/24  Vann, Jessica U, DO  polyethylene glycol (MIRALAX  / GLYCOLAX ) 17 g packet Take 17 g by mouth daily as needed for moderate constipation. 05/30/24   Vann, Jessica U, DO  predniSONE  (DELTASONE )  50 MG tablet 1 PO daily-- follow up with rheum/PCP For titration down 05/31/24   Vann, Jessica U, DO  saccharomyces boulardii (FLORASTOR) 250 MG capsule Take 1 capsule (250 mg total) by mouth 2 (two) times daily. 05/30/24   Juvenal Harlene PENNER, DO    Physical Exam: Vitals:   06/06/24 1732 06/06/24 1825 06/06/24 1832 06/06/24 2028  BP: 103/69  98/75 99/70  Pulse: 63  77 72  Resp: 17  15 20   Temp:   98 F (36.7 C) 98.3 F (36.8 C)  TempSrc:   Oral Oral  SpO2: 98%  98% 98%  Weight:  71.8 kg    Height:  5' 5 (1.651 m)     Constitutional: Resting in bed, NAD, calm, comfortable Eyes: EOMI, lids and conjunctivae normal ENMT: Mucous membranes are moist. Posterior pharynx clear of any exudate or lesions.Normal dentition.  Neck: normal, supple, no masses. Respiratory: clear to auscultation bilaterally, no wheezing, no crackles. Normal respiratory effort. No accessory muscle use.  Cardiovascular: Regular rate and rhythm, no murmurs / rubs / gallops. No extremity edema. 2+ pedal pulses. Abdomen: no tenderness, no masses palpated. Musculoskeletal: no clubbing / cyanosis. No joint deformity upper and lower extremities. Good ROM, no contractures. Normal muscle tone.  Skin: no rashes, lesions, ulcers. No induration Neurologic: Sensation intact. Strength 5/5 in all 4.  Psychiatric: Normal judgment and insight. Alert and oriented x 3. Normal mood.   EKG: Personally reviewed. Sinus rhythm, rate 99, no acute ischemic changes.  Assessment/Plan Principal Problem:   Severe sepsis (HCC) Active Problems:   Bacteremia due to Klebsiella pneumoniae   Normocytic anemia   PMR (polymyalgia rheumatica) (HCC)   Laurie Terry is a 58 y.o. female with medical history significant for recent clinically diagnosed giant cell arteritis and PMR on prednisone , recent Klebsiella pneumonia bacteremia, Enterococcus liver abscess, osteitis pubis who is admitted with sepsis.  Assessment and Plan: Severe sepsis Recent  Klebsiella bacteremia/Enterococcus liver abscess/osteitis pubis: Patient with recent admission for Klebsiella bacteremia and liver abscesses, aspirate of which grew Enterococcus faecalis.  She has been on amoxicillin  and cefadroxil  as an outpatient.  Patient now presenting with leukocytosis, pulse >90, and lactic acidosis.  CT chest/abd/pelvis shows changes suspicious for pulmonary septic emboli as well as persistent hepatic lesions concerning for abscesses. - Continue IV daptomycin  and ceftriaxone  - Follow blood cultures - Continue IV fluid hydration overnight - TTE on 05/25/2024 was negative for vegetation; consider TEE  GCS and PMR: Clinical diagnosis by her rheumatologist [name] with elevated inflammatory markers.  She has been on prednisone  50 mg and atovaquone .  Given concern for persistent infectious process would consider rediscussing with her rheumatologist regarding steroid taper. - Will continue prednisone  and atovaquone  for now  Normocytic anemia: Hemoglobin stable at 11.2.  Continue to monitor.   DVT prophylaxis: enoxaparin  (LOVENOX ) injection 40 mg Start: 06/06/24 2215 Code Status: Full code, confirmed with patient on admission Family Communication: Discussed with patient, she has discussed with family Disposition Plan: From home, dispo pending clinical progress Consults called: None Severity of Illness: The appropriate patient status for this patient is INPATIENT. Inpatient status is judged to be reasonable and necessary in order to provide the required intensity of service to ensure the patient's safety. The patient's presenting symptoms, physical exam findings, and initial radiographic and laboratory data in the context of their chronic comorbidities is felt to place them at high risk for further clinical deterioration. Furthermore, it is not anticipated that the patient will be medically stable for discharge from the hospital within 2 midnights of admission.   * I certify  that at the point of admission it is my clinical judgment that the patient will require inpatient hospital care spanning beyond 2 midnights from the point of admission due to high intensity of service, high risk for further deterioration and high frequency of surveillance required.DEWAINE Jorie Blanch MD [facility]  If  7PM-7AM, please contact night-coverage [email or web address]  06/06/2024, 9:31 PM

## 2024-06-06 NOTE — ED Notes (Signed)
 [place] MD made aware of critical lactic 3.8

## 2024-06-06 NOTE — ED Provider Notes (Signed)
 [facility] AT [facility] Provider Note   CSN: [id number] Arrival date & time: 06/06/24  1050     Patient presents with: Hypotension, Fatigue, and Diarrhea   Laurie Terry is a 58 y.o. female.   HPI      58 year old female with a history of esophageal spasm, aortic aneurysm, bicuspid aortic valve recently diagnosed with giant cell arteritis as well as PMR on high-dose steroids, who had a recent admission to the hospital August 29 to September 7 found to have thrombocytopenia, Klebsiella bacteremia with severe sepsis, enterococcal liver abscess, osteitis pubis or septic joint who presents with concern for lightheadedness, low blood pressure from walk in clinic.    On amoxicillin , cefadroxil    In the hour after waking up started to feel lightheaded Went to walk in clinic came here, had HR 120s, BP 80s systolic Has had diarrhea for 3 weeks, did stool sample and was c diff negative Earlier this week had some swelling in feet but then improved.  This week has been feeling fuzzier in AM and better in afternoon, but this AM could tell something not right Had lightheadedness, feeling like things blacking out, sat on the floor for a while and sent here   No fevers, took it one day and was 100 On long term abx Diarrhea has improved. GI dr. Just gave colestipol  , (not allergic to it...was listed with a bunch of other things) started it yesterday, has taken it before. No itching, rash  No nausea or vomiting No black or bloody stools Dry cough, thought it was thrush in the hospital  No other new medicines For past few days has ahd some lightheadednes sin AM but better in the afternoon  Taking steroid so feels racing with taht.  Prior to Admission medications   Medication Sig Start Date End Date Taking? Authorizing Provider  acetaminophen  (TYLENOL ) 500 MG tablet Take 500 mg by mouth every 6 (six) hours as needed.    [provider]  Adapalene  0.3 % gel Apply 1 application  topically daily as needed. 04/29/22   [provider]  alum & mag hydroxide-simeth (MAALOX PLUS) 400-400-40 MG/5ML suspension Take 15 mLs by mouth every 6 (six) hours as needed for indigestion. 05/07/24   [name], MD  amoxicillin  (AMOXIL ) 500 MG capsule Take 2 capsules (1,000 mg total) by mouth every 8 (eight) hours. 05/30/24   Vann, Jessica U, DO  atovaquone  (MEPRON ) 750 MG/5ML suspension Take 750 mg by mouth 2 (two) times daily.    [provider]  cefadroxil  (DURICEF) 500 MG capsule Take 2 capsules (1,000 mg total) by mouth 2 (two) times daily. 05/30/24   Vann, Jessica U, DO  diclofenac  Sodium (VOLTAREN ) 1 % GEL Apply 2 g topically 4 (four) times daily. 05/30/24   Vann, Jessica U, DO  dicyclomine  (BENTYL ) 20 MG tablet Take 1 tablet (20 mg total) by mouth 2 (two) times daily. Patient taking differently: Take 20 mg by mouth 2 (two) times daily as needed. 05/07/24   [name], MD  EPINEPHrine  0.3 mg/0.3 mL IJ SOAJ injection Inject 0.3 mg into the muscle as needed for anaphylaxis. 10/08/22   Tobie Arleta SQUIBB, MD  famotidine  (PEPCID ) 20 MG tablet Take 20 mg by mouth daily as needed for heartburn or indigestion.    [provider]  fluticasone  (FLONASE ) 50 MCG/ACT nasal spray Place 2 sprays into both nostrils daily. Patient taking differently: Place 2 sprays into both nostrils daily as needed.  10/08/22   Tobie Arleta SQUIBB, MD  loperamide  (IMODIUM ) 2 MG capsule Take 1 capsule (2 mg total) by mouth every 4 (four) hours as needed for diarrhea or loose stools. 05/30/24   Vann, Jessica U, DO  nystatin  (MYCOSTATIN ) 100000 UNIT/ML suspension Take 5 mLs (500,000 Units total) by mouth 4 (four) times daily for 13 days. 05/30/24 06/12/24  Vann, Jessica U, DO  polyethylene glycol (MIRALAX  / GLYCOLAX ) 17 g packet Take 17 g by mouth daily as needed for moderate constipation. 05/30/24   Vann, Jessica U, DO  predniSONE  (DELTASONE ) 50 MG tablet 1 PO daily-- follow up  with rheum/PCP For titration down 05/31/24   Vann, Jessica U, DO  saccharomyces boulardii (FLORASTOR) 250 MG capsule Take 1 capsule (250 mg total) by mouth 2 (two) times daily. 05/30/24   Vann, Jessica U, DO    Allergies: Contrast media [iodinated contrast media], Iodine, Midodrine , Other, Augmentin  [amoxicillin -pot clavulanate], Avocado, Levaquin [levofloxacin], Levofloxacin, Pantoprazole , Sulfa antibiotics, Vancomycin , and Colestipol  hcl    Review of Systems  Updated Vital Signs BP 94/63 (BP Location: Right Arm)   Pulse 66   Temp 97.9 F (36.6 C) (Oral)   Resp 18   Ht 5' 5 (1.651 m)   Wt 71.8 kg   SpO2 97%   BMI 26.34 kg/m   Physical Exam Vitals and nursing note reviewed.  Constitutional:      General: She is not in acute distress.    Appearance: She is well-developed. She is not diaphoretic.  HENT:     Head: Normocephalic and atraumatic.  Eyes:     Conjunctiva/sclera: Conjunctivae normal.  Cardiovascular:     Rate and Rhythm: Normal rate and regular rhythm.     Heart sounds: Normal heart sounds. No murmur heard.    No friction rub. No gallop.  Pulmonary:     Effort: Pulmonary effort is normal. No respiratory distress.     Breath sounds: Normal breath sounds. No wheezing or rales.  Abdominal:     General: There is no distension.     Palpations: Abdomen is soft.     Tenderness: There is no abdominal tenderness. There is no guarding.  Musculoskeletal:        General: No tenderness.     Cervical back: Normal range of motion.  Skin:    General: Skin is warm and dry.     Findings: No erythema or rash.  Neurological:     Mental Status: She is alert and oriented to person, place, and time.     (all labs ordered are listed, but only abnormal results are displayed) Labs Reviewed  COMPREHENSIVE METABOLIC PANEL WITH GFR - Abnormal; Notable for the following components:      Result Value   Chloride 96 (*)    Glucose, Bld 192 (*)    Albumin 3.3 (*)    Alkaline Phosphatase  144 (*)    Anion gap 16 (*)    All other components within normal limits  CBC - Abnormal; Notable for the following components:   WBC 17.5 (*)    RBC 3.73 (*)    Hemoglobin 11.2 (*)    HCT 33.7 (*)    RDW 17.9 (*)    All other components within normal limits  URINALYSIS, ROUTINE W REFLEX MICROSCOPIC - Abnormal; Notable for the following components:   APPearance HAZY (*)    Protein, ur TRACE (*)    Bacteria, UA RARE (*)    All other components within normal limits  LACTIC ACID,  PLASMA - Abnormal; Notable for the following components:   Lactic Acid, Venous 3.8 (*)    All other components within normal limits  LACTIC ACID, PLASMA - Abnormal; Notable for the following components:   Lactic Acid, Venous 2.9 (*)    All other components within normal limits  CULTURE, BLOOD (ROUTINE X 2)  CULTURE, BLOOD (ROUTINE X 2)  LIPASE, BLOOD  CBC  BASIC METABOLIC PANEL WITH GFR    EKG: EKG Interpretation Date/Time:  Sunday June 06 2024 11:14:07 EDT Ventricular Rate:  99 PR Interval:  131 QRS Duration:  89 QT Interval:  323 QTC Calculation: 415 R Axis:   55  Text Interpretation: Sinus rhythm Minimal ST elevation, inferior leads Since prior ECG, rate has increased Confirmed by Dreama Longs ([id number]) on 06/06/2024 10:44:56 PM  Radiology: CT CHEST ABDOMEN PELVIS WO CONTRAST Result Date: 06/06/2024 CLINICAL DATA:  Sepsis EXAM: CT CHEST, ABDOMEN AND PELVIS WITHOUT CONTRAST TECHNIQUE: Multidetector CT imaging of the chest, abdomen and pelvis was performed following the standard protocol without IV contrast. RADIATION DOSE REDUCTION: This exam was performed according to the departmental dose-optimization program which includes automated exposure control, adjustment of the mA and/or kV according to patient size and/or use of iterative reconstruction technique. COMPARISON:  Multiple exams, including 05/07/2024 FINDINGS: CT CHEST FINDINGS Cardiovascular: Unremarkable Mediastinum/Nodes: Unremarkable  Lungs/Pleura: Several scattered lung nodules are new compared to prior 05/07/2024 exam with the larger nodules being cavitary, favoring multifocal cavitary pneumonia or septic emboli. Index nodule anteriorly in the right upper lobe measures 1.4 by 1.0 cm on image 64 series 3. Mild atelectasis in both lower lobes. Musculoskeletal: Unremarkable CT ABDOMEN PELVIS FINDINGS Hepatobiliary: Several scattered hypodense hepatic lesions are observed including an index 1.9 by 1.7 cm lesion in the right hepatic lobe on image 51 series 2 which formerly measured 1.6 by 1.4 cm on the MRI from 05/26/2024. These lesions previously demonstrated mild rim enhancement concerning for abscesses. Pneumobilia is observed, as shown on 05/07/2024. Prior cholecystectomy. Pancreas: Unremarkable Spleen: Unremarkable Adrenals/Urinary Tract: Unremarkable Stomach/Bowel: Prominent stool throughout the colon favors constipation. Vascular/Lymphatic: Unremarkable Reproductive: Unremarkable Other: No supplemental non-categorized findings. Previous pelvic ascites has resolved. Musculoskeletal: Left eccentric sacral Tarlov cyst. IMPRESSION: 1. Several scattered lung nodules are new compared to prior 05/07/2024 exam with the larger nodules being cavitary, favoring multifocal cavitary pneumonia or septic emboli. 2. Several scattered hypodense hepatic lesions are observed including an index 1.9 by 1.7 cm lesion in the right hepatic lobe which formerly measured 1.6 by 1.4 cm on the MRI from 05/26/2024. These lesions previously demonstrated mild rim enhancement concerning for abscesses. 3. Prominent stool throughout the colon favors constipation. 4. Pneumobilia, as shown on 05/07/2024. 5. Previous pelvic ascites has resolved. Electronically Signed   By: Ryan Salvage M.D.   On: 06/06/2024 14:13     .Critical Care  Performed by: Dreama Longs, MD Authorized by: Dreama Longs, MD   Critical care provider statement:    Critical care time (minutes):   30   Critical care was time spent personally by me on the following activities:  Development of treatment plan with patient or surrogate, discussions with consultants, evaluation of patient's response to treatment, examination of patient, ordering and review of laboratory studies, ordering and review of radiographic studies, ordering and performing treatments and interventions, pulse oximetry, re-evaluation of patient's condition and review of old charts    Medications Ordered in the ED  lactated ringers  infusion ( Intravenous New Bag/Given 06/06/24 1619)  cefTRIAXone  (ROCEPHIN ) 2 g in sodium  chloride 0.9 % 100 mL IVPB (has no administration in time range)  DAPTOmycin  (CUBICIN ) IVPB 500 mg/50mL premix (has no administration in time range)  DAPTOmycin  (CUBICIN ) IVPB 700 mg/100mL premix (has no administration in time range)  atovaquone  (MEPRON ) 750 MG/5ML suspension 750 mg (has no administration in time range)  famotidine  (PEPCID ) tablet 40 mg (has no administration in time range)  predniSONE  (DELTASONE ) tablet 50 mg (has no administration in time range)  saccharomyces boulardii (FLORASTOR) capsule 250 mg (has no administration in time range)  colestipol  (COLESTID ) tablet 1-2 g (has no administration in time range)  dicyclomine  (BENTYL ) capsule 20 mg (has no administration in time range)  loperamide  (IMODIUM ) capsule 2 mg (has no administration in time range)  enoxaparin  (LOVENOX ) injection 40 mg (has no administration in time range)  sodium chloride  flush (NS) 0.9 % injection 3 mL (has no administration in time range)  acetaminophen  (TYLENOL ) tablet 650 mg (has no administration in time range)    Or  acetaminophen  (TYLENOL ) suppository 650 mg (has no administration in time range)  ondansetron  (ZOFRAN ) tablet 4 mg (has no administration in time range)    Or  ondansetron  (ZOFRAN ) injection 4 mg (has no administration in time range)  polyethylene glycol (MIRALAX  / GLYCOLAX ) packet 17 g (has no  administration in time range)  lactated ringers  bolus 1,000 mL (0 mLs Intravenous Stopped 06/06/24 1327)  cefTRIAXone  (ROCEPHIN ) 2 g in sodium chloride  0.9 % 100 mL IVPB (0 g Intravenous Stopped 06/06/24 1408)  lactated ringers  bolus 1,000 mL (0 mLs Intravenous Stopped 06/06/24 1520)  lactated ringers  bolus 500 mL (0 mLs Intravenous Stopped 06/06/24 1520)  lactated ringers  bolus 1,000 mL (0 mLs Intravenous Stopped 06/06/24 1740)                                     58 year old female with a history of esophageal spasm, aortic aneurysm, bicuspid aortic valve recently diagnosed with giant cell arteritis as well as PMR on high-dose steroids, who had a recent admission to the hospital August 29 to September 7 found to have thrombocytopenia, Klebsiella bacteremia with severe sepsis, enterococcal liver abscess, osteitis pubis or septic joint who presents with concern for lightheadedness, low blood pressure from walk in clinic.    BP was 80s systolic in walk in clinic, on arrival to ED improved to her baseline pressures 90s-100s.  DDx includes sepsis, dehydration, adrenal insufficiency. No chest pain or dyspnea to suggest PE, myocarditis.  Labs completed and evaluated by me show lactic acidosis and leukocytosis.  UA unlikely UTI. No clinically significant electrolyte abnormalities.   Ordered daptomycin , rocephin . Discussed with Dr. Luiz ID. Ordered dificin for possible cdiff.   Clinically, speaking with patient lower suspicion given negative outpt cdiff with Dr. Rosalie this last week.  CT c/a/p ordered to evaluate for infection source and called for admission.      Final diagnoses:  Sepsis without acute organ dysfunction, due to unspecified organism [facility])  Lactic acidosis    ED Discharge Orders     None          Dreama Longs, MD 06/06/24 2313

## 2024-06-07 DIAGNOSIS — M353 Polymyalgia rheumatica: Secondary | ICD-10-CM

## 2024-06-07 DIAGNOSIS — B965 Pseudomonas (aeruginosa) (mallei) (pseudomallei) as the cause of diseases classified elsewhere: Secondary | ICD-10-CM | POA: Diagnosis not present

## 2024-06-07 DIAGNOSIS — R932 Abnormal findings on diagnostic imaging of liver and biliary tract: Secondary | ICD-10-CM | POA: Diagnosis not present

## 2024-06-07 DIAGNOSIS — R7881 Bacteremia: Secondary | ICD-10-CM

## 2024-06-07 DIAGNOSIS — J188 Other pneumonia, unspecified organism: Secondary | ICD-10-CM

## 2024-06-07 DIAGNOSIS — I76 Septic arterial embolism: Secondary | ICD-10-CM | POA: Diagnosis not present

## 2024-06-07 DIAGNOSIS — R6520 Severe sepsis without septic shock: Secondary | ICD-10-CM | POA: Diagnosis not present

## 2024-06-07 LAB — CK: Total CK: 26 U/L — ABNORMAL LOW (ref 38–234)

## 2024-06-07 LAB — BLOOD CULTURE ID PANEL (REFLEXED) - BCID2

## 2024-06-07 LAB — CBC
HCT: 28.7 % — ABNORMAL LOW (ref 36.0–46.0)
Hemoglobin: 9 g/dL — ABNORMAL LOW (ref 12.0–15.0)
MCH: 29.7 pg (ref 26.0–34.0)
MCHC: 31.4 g/dL (ref 30.0–36.0)
MCV: 94.7 fL (ref 80.0–100.0)
Platelets: 199 K/uL (ref 150–400)
RBC: 3.03 MIL/uL — ABNORMAL LOW (ref 3.87–5.11)
RDW: 17.5 % — ABNORMAL HIGH (ref 11.5–15.5)
WBC: 8.5 K/uL (ref 4.0–10.5)
nRBC: 0 % (ref 0.0–0.2)

## 2024-06-07 LAB — BASIC METABOLIC PANEL WITH GFR
Anion gap: 11 (ref 5–15)
BUN: 11 mg/dL (ref 6–20)
CO2: 25 mmol/L (ref 22–32)
Calcium: 8 mg/dL — ABNORMAL LOW (ref 8.9–10.3)
Chloride: 106 mmol/L (ref 98–111)
Creatinine, Ser: 0.49 mg/dL (ref 0.44–1.00)
GFR, Estimated: >60 mL/min (ref >60)
Glucose, Bld: 102 mg/dL — ABNORMAL HIGH (ref 70–99)
Potassium: 3.3 mmol/L — ABNORMAL LOW (ref 3.5–5.1)
Sodium: 142 mmol/L (ref 135–145)

## 2024-06-07 MED ORDER — PIPERACILLIN-TAZOBACTAM 3.375 G IVPB
3.3750 g | Freq: Three times a day (TID) | INTRAVENOUS | Status: DC
  Administered 2024-06-07: 3.375 g via INTRAVENOUS
  Filled 2024-06-07: qty 50

## 2024-06-07 MED ORDER — NYSTATIN 100000 UNIT/ML MT SUSP
5.0000 mL | Freq: Four times a day (QID) | OROMUCOSAL | Status: DC
  Administered 2024-06-07 – 2024-06-11 (×14): 500000 [IU] via ORAL
  Filled 2024-06-07 (×20): qty 5

## 2024-06-07 MED ORDER — COLESTIPOL HCL 1 G PO TABS
1.0000 g | ORAL_TABLET | Freq: Two times a day (BID) | ORAL | Status: DC
  Administered 2024-06-07 – 2024-06-14 (×11): 1 g via ORAL
  Filled 2024-06-07 (×16): qty 1

## 2024-06-07 MED ORDER — SODIUM CHLORIDE 0.9 % IV SOLN
1.0000 g | Freq: Three times a day (TID) | INTRAVENOUS | Status: DC
  Administered 2024-06-07 – 2024-06-14 (×21): 1 g via INTRAVENOUS
  Filled 2024-06-07 (×22): qty 20

## 2024-06-07 MED ORDER — DAPTOMYCIN-SODIUM CHLORIDE 700-0.9 MG/100ML-% IV SOLN
700.0000 mg | Freq: Every day | INTRAVENOUS | Status: DC
  Filled 2024-06-07: qty 100

## 2024-06-07 MED ORDER — DAPTOMYCIN-SODIUM CHLORIDE 700-0.9 MG/100ML-% IV SOLN
700.0000 mg | Freq: Once | INTRAVENOUS | Status: AC
  Administered 2024-06-07: 700 mg via INTRAVENOUS
  Filled 2024-06-07: qty 100

## 2024-06-07 MED ORDER — DIPHENHYDRAMINE HCL 50 MG/ML IJ SOLN
25.0000 mg | Freq: Four times a day (QID) | INTRAMUSCULAR | Status: DC | PRN
  Administered 2024-06-07: 25 mg via INTRAVENOUS
  Filled 2024-06-07 (×2): qty 1

## 2024-06-07 MED ORDER — PIPERACILLIN-TAZOBACTAM 3.375 G IVPB: 3.3750 g | Freq: Three times a day (TID) | INTRAVENOUS | Status: DC

## 2024-06-07 MED ORDER — SODIUM CHLORIDE 0.9 % IV SOLN
2.0000 g | Freq: Three times a day (TID) | INTRAVENOUS | Status: DC
  Administered 2024-06-07: 2 g via INTRAVENOUS
  Filled 2024-06-07: qty 12.5

## 2024-06-07 NOTE — Plan of Care (Signed)

## 2024-06-07 NOTE — TOC Initial Note (Signed)
 Transition of Care [facility]) - Initial/Assessment Note    Patient Details  Name: Laurie Terry MRN: [id number] Date of Birth: 12/19/1965  [facility] CM/SW Contact:    Bascom Service, RN Phone Number: 06/07/2024, 2:24 PM  Clinical Narrative:d/c plan home. Has own transport.                   Expected Discharge Plan: Home/Self Care Barriers to Discharge: Continued Medical Work up   Patient Goals and CMS Choice Patient states their goals for this hospitalization and ongoing recovery are:: Home CMS Medicare.gov Compare Post Acute Care list provided to:: Patient Choice offered to / list presented to : Patient [place] ownership interest in [facility].provided to:: Patient    Expected Discharge Plan and Services                                              Prior Living Arrangements/Services                       Activities of Daily Living   ADL Screening (condition at time of admission) Independently performs ADLs?: Yes (appropriate for developmental age) Does the patient have a NEW difficulty with bathing/dressing/toileting/self-feeding that is expected to last >3 days?: No Does the patient have a NEW difficulty with getting in/out of bed, walking, or climbing stairs that is expected to last >3 days?: No Does the patient have a NEW difficulty with communication that is expected to last >3 days?: No Is the patient deaf or have difficulty hearing?: No Does the patient have difficulty seeing, even when wearing glasses/contacts?: No Does the patient have difficulty concentrating, remembering, or making decisions?: No  Permission Sought/Granted                  Emotional Assessment              Admission diagnosis:  Sepsis (HCC) [A41.9] Patient Active Problem List   Diagnosis Date Noted   Severe sepsis (HCC) 06/06/2024   PMR (polymyalgia rheumatica) (HCC) 06/06/2024   Pubic bone pain 05/27/2024   Sinus bradycardia  05/26/2024   Bacteremia due to Klebsiella pneumoniae 05/24/2024   Normocytic anemia 05/24/2024   Dilatation of aorta (HCC) 01/24/2023   Perinephric abscess 03/03/2022   Norovirus 02/15/2022   Dry cough 02/02/2022   Iatrogenic subdiaphragmatic abscess (HCC) 02/02/2022   Pleural effusion on right    Post-ERCP acute pancreatitis 01/16/2022   GERD without esophagitis 01/16/2022   Choledocholithiasis 01/16/2022   Adverse effect of other drugs, medicaments and biological substances, subsequent encounter 05/23/2021   Other allergic rhinitis 05/23/2021   Allergic conjunctivitis of both eyes 05/23/2021   History of asthma 05/23/2021   Oral allergy syndrome, subsequent encounter 05/23/2021   Family history of bicuspid aortic valve 11/28/2020   Other hyperlipidemia 11/28/2020   PAC (premature atrial contraction) 10/30/2020   Postoperative bile leak 04/12/2016   Acute hypokalemia 04/12/2016   Paroxysmal SVT (supraventricular tachycardia) (HCC) 04/12/2016   Bile leak, postoperative 04/12/2016   S/P laparoscopic cholecystectomy    History of Gallstone pancreatitis 04/07/2016   Pancreatitis due to biliary obstruction 04/07/2016   Cancer (HCC)    Osteopenia    PCP:  [name], MD Pharmacy:   [facility] [phone] - [place], [place] - [address] AT [address] &  [facility] [address] [place] [address] Phone: [phone] Fax: [phone]     Social Drivers of Health (SDOH) Social History: SDOH Screenings   Food Insecurity: No Food Insecurity (06/06/2024)  Housing: Low Risk  (06/06/2024)  Transportation Needs: No Transportation Needs (06/06/2024)  Utilities: Not At Risk (06/06/2024)  Depression (PHQ2-9): Low Risk  (10/13/2023)  Social Connections: Moderately Integrated (05/22/2024)  Tobacco Use: Medium Risk (06/06/2024)   SDOH Interventions:     Readmission Risk Interventions     No data to display

## 2024-06-07 NOTE — Consult Note (Signed)
 [facility]                                                                                        Patient Identification: Patient Name: Laurie Terry MRN: [id number] Admit Date: 06/06/2024 11:00 AM Today's Date: 06/07/2024 Reason for consult: PsA bacteremia  Requesting provider: Dr Jadine  Principal Problem:   Severe sepsis [facility]) Active Problems:   Bacteremia due to Klebsiella pneumoniae   Normocytic anemia   PMR (polymyalgia rheumatica) (HCC)   Antibiotics:  Daptomycin  9/14 Ceftriaxone  9/14 Not on fidaxmicin  Lines/Hardware:  Assessment # PsA bacteremia  # CT with several scattered lung nodules new with larger nodules being cavitary favoring multifocal cavitary pneumonia/septic emboli.  Several scattered hypodense hepatic lesions, increased in size, with mild rim enhancement concerning for abscesses  # Recent admission 8/29-9/7 for Klebsiella pneumonia bacteremia with E faecium hepatic abscess, osteitis pubis, early septic joint, myositis  # H/o hepatic abscess with E faecalis and Candida albicans in May 2023 s/p treatment with Augmentin  and fluconazole   Comments - likely GI source of recurrent infection   Recommendations  - IV ceftriaxone  was initially switched to cefepime  however patient had itching started after 5 to 10 minutes of IV cefepime  and started.  Cefepime  has been discontinued, got one dose of zosyn . Will start meropenem  pending sensitivities for PsA for optimal coverage. Continue Daptomycin  - Benadryl  prn for itching. Closely monitor for rashes or any signs of allergy  - 2 sets of blood cultures ordered for tomorrow for clearance - Needs TEE due to high concerns for endocarditis - Monitor CBC, CMP and CPK - Universal/standard isolation precaution D/w primary team  Rest of the management as per the primary team. Please call with questions or concerns.   Thank you for the consult  __________________________________________________________________________________________________________ HPI and Hospital Course: 58 year old female with prior history with recently diagnosed giant cell arteritis with PMR on prednisone  and prophylactic atovaquone , h/o intra-abdominal abscess (subhepatic/perinephric, Cx with E faecalis and Candida albicans) following ERCP in 2023 treated with Augmentin  and fluconazole  and most recently with Klebsiella pneumonia bacteremia, Enterococcus hepatic abscess, osteitis pubis ( discharged on PO amoxicillin  and cefadroxil ) who presented to the ED on 8/14 with fatigue, lightheadedness for few days.  She went to GI walk-in clinic where she was tachycardic with low blood pressure and sent to ED.  No reported fever, chills nausea or vomiting.  Diarrhea which she had earlier has improved. She was started in colestipol  OP by GI which made her stool soft.   Recently admitted 8/29-9/7 for thrombocytopenia.  Hospital course remarkable for receiving IVIG as well as Klebsiella pneumonia bacteremia, Enterococcus faecium liver abscess with osteitis pubis with question of septic joint/myositis. Seen by ID, underwent aspiration of liver by IR on 9/4 with culture growing E faecium. Patient was treated with daptomycin , ceftriaxone  and metronidazole  while in the hospital and then transitioned to p.o. amoxicillin  and cefadroxil  for 4 weeks course.   At ED afebrile Labs remarkable for BG 192, ALP 144, albumin 3.3, lactic acid 3.8, WBC 17.5, Hb 11.2  9/14 blood cx GNR in both sets, BCID as PsA.  Received IVF, IV daptomycin , ceftriaxone  and oral fidaxomicin  after discussion with Dr Luiz.   CT  IMPRESSION: 1. Several scattered lung nodules are new compared to prior 05/07/2024 exam with the larger nodules being cavitary, favoring multifocal cavitary pneumonia or septic emboli. 2. Several scattered hypodense hepatic lesions are observed including  an index 1.9 by 1.7 cm lesion in the right hepatic lobe which formerly measured 1.6 by 1.4 cm on the MRI from 05/26/2024. These lesions previously demonstrated mild rim enhancement concerning for abscesses. 3. Prominent stool throughout the colon favors constipation. 4. Pneumobilia, as shown on 05/07/2024. 5. Previous pelvic ascites has resolved.  ROS: General- Denies fever, chills, loss of appetite and loss of weight HEENT - Denies headache, blurry vision, neck pain, sinus pain Chest - Denies any chest pain, SOB or cough CVS- Denies any dizziness/lightheadedness currently, syncopal attacks, palpitations Abdomen- Denies any nausea, vomiting, abdominal pain, hematochezia and diarrhea Neuro - Denies any weakness, numbness, tingling sensation Psych - Denies any changes in mood irritability or depressive symptoms GU- Denies any burning, dysuria, hematuria or increased frequency of urination Skin - denies any rashes/lesions MSK - denies any joint pain/swelling or restricted ROM   Past Medical History:  Diagnosis Date   Allergic rhinitis    Aortic aneurysm (HCC) 09/23/2020   Cancer (HCC)    Melanoma   Cervical dysplasia 2007   low-grade SIL, normal Pap smears since then   Family history of adverse reaction to anesthesia    mom has h/o post op nausea/vomiting   Intermittent palpitations    IUD    inserted 03/2002.04/06/2007, 06/03/2012   Ocular migraine    Osteomalacia    Pancreatitis    Seafood allergy    Past Surgical History:  Procedure Laterality Date   BIOPSY  01/21/2022   Procedure: BIOPSY;  Surgeon: Dianna Specking, MD;  Location: [facility];  Service: Gastroenterology;;   ROMAYNE  2018   CHOLECYSTECTOMY N/A 04/09/2016   Procedure: LAPAROSCOPIC CHOLECYSTECTOMY ;  Surgeon: Donnice Bury, MD;  Location: [facility] OR;  Service: General;  Laterality: N/A;   ERCP  04/11/2016   ERCP with pancreatic stent and failed CBD stent after needle knife and conventional sphincterotomy    ERCP N/A 04/12/2016   Procedure: ENDOSCOPIC RETROGRADE CHOLANGIOPANCREATOGRAPHY (ERCP);  Surgeon: Oliva Boots, MD;  Location: [facility] END[facility]COPY;  Service: Endoscopy;  Laterality: N/A;   ERCP N/A 01/15/2022   Procedure: ENDOSCOPIC RETROGRADE CHOLANGIOPANCREATOGRAPHY (ERCP);  Surgeon: Boots Oliva, MD;  Location: [facility];  Service: Gastroenterology;  Laterality: N/A;   ESOPHAGOGASTRODUODENOSCOPY N/A 06/02/2024   Procedure: EGD (ESOPHAGOGASTRODUODENOSCOPY);  Surgeon: Burnette Fallow, MD;  Location: [facility];  Service: Gastroenterology;  Laterality: N/A;   EUS N/A 06/02/2024   Procedure: ULTRASOUND, UPPER GI TRACT, ENDOSCOPIC;  Surgeon: Burnette Fallow, MD;  Location: [facility];  Service: Gastroenterology;  Laterality: N/A;   FLEXIBLE SIGMOIDOSCOPY N/A 01/21/2022   Procedure: FLEXIBLE SIGMOIDOSCOPY;  Surgeon: Dianna Specking, MD;  Location: [facility];  Service: Gastroenterology;  Laterality: N/A;   FOOT SURGERY     toenail   INTRAUTERINE DEVICE INSERTION  08/07/2017   Mirena    IR EXCHANGE BILIARY DRAIN  02/11/2022   IR RADIOLOGIST EVAL & MGMT  02/08/2022   IR RADIOLOGIST EVAL & MGMT  02/20/2022   IR RADIOLOGIST EVAL & MGMT  02/28/2022   removal of melanoma     REMOVAL OF STONES  01/15/2022   Procedure: REMOVAL OF STONES;  Surgeon: Boots Oliva, MD;  Location: [facility];  Service: Gastroenterology;;   ANNETT  01/15/2022  Procedure: SPHINCTEROTOMY;  Surgeon: Rosalie Kitchens, MD;  Location: [facility];  Service: Gastroenterology;;   Scheduled Meds:  atovaquone   750 mg Oral BID   colestipol   1-2 g Oral BID   enoxaparin  (LOVENOX ) injection  40 mg Subcutaneous Q24H   famotidine   40 mg Oral BID   nystatin   5 mL Oral QID   predniSONE   50 mg Oral Q breakfast   saccharomyces boulardii  250 mg Oral BID   sodium chloride  flush  3 mL Intravenous Q12H   Continuous Infusions:  ceFEPime  (MAXIPIME ) IV 2 g (06/07/24 0916)   DAPTOmycin      lactated ringers  125 mL/hr at 06/07/24 0306   PRN  Meds:.acetaminophen  **OR** acetaminophen , dicyclomine , loperamide , ondansetron  **OR** ondansetron  (ZOFRAN ) IV, polyethylene glycol  Allergies  Allergen Reactions   Contrast Media [Iodinated Contrast Media] Shortness Of Breath and Other (See Comments)    Low blood pressure    Midodrine  Shortness Of Breath   Avocado Itching and Other (See Comments)    Tongue and mouth.    Fish Allergy Hives and Swelling   Levofloxacin Hives and Other (See Comments)    Pt already has aortic aneurysm.   Pantoprazole  Other (See Comments)    Joint aches, fever, peeling of skin   Quinolones Other (See Comments)    Contraindicated due to history of Aortic Aneurysm    Shellfish Allergy Hives and Swelling   Sulfa Antibiotics Hives   Vancomycin  Rash, Other (See Comments) and Cough    Patient develops cough, rash ears, tingling lips after receiving Cefepime  and vancomycin  01/23/2022 - Patient had an allergy test and the MD stated that it is not a real allergy and that it was just an infusion reaction    Augmentin  [Amoxicillin -Pot Clavulanate] Nausea And Vomiting   Social History   Socioeconomic History   Marital status: Married    Spouse name: Marcey Blumenthal   Number of children: 4   Years of education: Not on file   Highest education level: Not on file  Occupational History   Not on file  Tobacco Use   Smoking status: Former    Passive exposure: Past   Smokeless tobacco: Never  Vaping Use   Vaping status: Never Used  Substance and Sexual Activity   Alcohol use: Yes   Drug use: No   Sexual activity: Yes    Partners: Male    Birth control/protection: Post-menopausal    Comment: des neg  Other Topics Concern   Not on file  Social History Narrative   Not on file   Social Drivers of Health   Financial Resource Strain: Not on file  Food Insecurity: No Food Insecurity (06/06/2024)   Hunger Vital Sign    Worried About Running Out of Food in the Last Year: Never true    Ran Out of Food in the Last  Year: Never true  Transportation Needs: No Transportation Needs (06/06/2024)   PRAPARE - Administrator, Civil Service (Medical): No    Lack of Transportation (Non-Medical): No  Physical Activity: Not on file  Stress: Not on file  Social Connections: Moderately Integrated (05/22/2024)   Social Connection and Isolation Panel    Frequency of Communication with Friends and Family: More than three times a week    Frequency of Social Gatherings with Friends and Family: More than three times a week    Attends Religious Services: Never    Database administrator or Organizations: Yes    Attends Banker Meetings:  More than 4 times per year    Marital Status: Married  Catering manager Violence: Not At Risk (06/06/2024)   Humiliation, Afraid, Rape, and Kick questionnaire    Fear of Current or Ex-Partner: No    Emotionally Abused: No    Physically Abused: No    Sexually Abused: No   Family History  Problem Relation Age of Onset   Tremor Mother    Stroke Father    Autoimmune disease Father    CVA Father    Breast cancer Sister 98       3X   Arthritis Sister        Psoriatic   Thyroid  disease Sister    Breast cancer Maternal Grandmother        Age 93   Thyroid  disease Daughter        Hypo   Uterine cancer Neg Hx    Bladder Cancer Neg Hx    Renal cancer Neg Hx    Vitals BP 102/63 (BP Location: Left Arm)   Pulse 75   Temp 97.7 F (36.5 C) (Oral)   Resp 18   Ht 5' 5 (1.651 m)   Wt 71.8 kg   SpO2 97%   BMI 26.34 kg/m    Physical Exam Constitutional: Adult female sitting in the recliner, not in acute distress    Comments: HEENT WNL  Cardiovascular:     Rate and Rhythm: Normal rate and regular rhythm.     Heart sounds: S1 and S2  Pulmonary:     Effort: Pulmonary effort is normal.     Comments: Normal breath sounds  Abdominal:     Palpations: Abdomen is soft.     Tenderness: Nontender and nondistended  Musculoskeletal:        General: No  swelling or tenderness in peripheral joints.  No signs of septic joint  Skin:    Comments: No rashes  Neurological:     General: Awake, alert and oriented, grossly nonfocal  Psychiatric:        Mood and Affect: Mood normal.    Pertinent Microbiology Results for orders placed or performed during the hospital encounter of 06/06/24  Blood culture (routine x 2)     Status: None (Preliminary result)   Collection Time: 06/06/24 11:12 AM   Specimen: BLOOD  Result Value Ref Range Status   Specimen Description   Final    BLOOD LEFT ANTECUBITAL Performed at [facility], [address] [place], [place] [address]    Special Requests   Final    BOTTLES DRAWN AEROBIC AND ANAEROBIC Blood Culture results may not be optimal due to an inadequate volume of blood received in culture bottles Performed at [facility], [address] [place], [place] [address]    Culture  Setup Time   Final    GRAM NEGATIVE RODS IN BOTH AEROBIC AND ANAEROBIC BOTTLES CRITICAL RESULT CALLED TO, READ BACK BY AND VERIFIED WITHBETHA RONAL HENCH PHARMD, AT 9188 06/07/24 D. VANHOOK CRITICAL VALUE NOTED.  VALUE IS CONSISTENT WITH PREVIOUSLY REPORTED AND CALLED VALUE. Performed at [facility], [address] [place], [place] [address]    Culture GRAM NEGATIVE RODS  Final   Report Status PENDING  Incomplete  Blood culture (routine x 2)     Status: None (Preliminary result)   Collection Time: 06/06/24 12:03 PM   Specimen: BLOOD  Result Value Ref Range Status   Specimen Description   Final    BLOOD RIGHT ANTECUBITAL Performed at  [facility], [address] [place], [place] [address]    Special Requests   Final    BOTTLES DRAWN AEROBIC AND ANAEROBIC Blood Culture adequate volume Performed at [facility], [address] [place], [place] [address]    Culture  Setup Time   Final    GRAM NEGATIVE RODS AEROBIC BOTTLE ONLY Organism ID  to follow CRITICAL RESULT CALLED TO, READ BACK BY AND VERIFIED WITHBETHA RONAL HENCH PHARMD, AT 9188 06/07/24 D. VANHOOK Performed at [facility], [address] [place], [place] [address]    Culture GRAM NEGATIVE RODS  Final   Report Status PENDING  Incomplete  Blood Culture ID Panel (Reflexed)     Status: Abnormal   Collection Time: 06/06/24 12:03 PM  Result Value Ref Range Status   Enterococcus faecalis NOT DETECTED NOT DETECTED Final   Enterococcus Faecium NOT DETECTED NOT DETECTED Final   Listeria monocytogenes NOT DETECTED NOT DETECTED Final   Staphylococcus species NOT DETECTED NOT DETECTED Final   Staphylococcus aureus (BCID) NOT DETECTED NOT DETECTED Final   Staphylococcus epidermidis NOT DETECTED NOT DETECTED Final   Staphylococcus lugdunensis NOT DETECTED NOT DETECTED Final   Streptococcus species NOT DETECTED NOT DETECTED Final   Streptococcus agalactiae NOT DETECTED NOT DETECTED Final   Streptococcus pneumoniae NOT DETECTED NOT DETECTED Final   Streptococcus pyogenes NOT DETECTED NOT DETECTED Final   A.calcoaceticus-baumannii NOT DETECTED NOT DETECTED Final   Bacteroides fragilis NOT DETECTED NOT DETECTED Final   Enterobacterales NOT DETECTED NOT DETECTED Final   Enterobacter cloacae complex NOT DETECTED NOT DETECTED Final   Escherichia coli NOT DETECTED NOT DETECTED Final   Klebsiella aerogenes NOT DETECTED NOT DETECTED Final   Klebsiella oxytoca NOT DETECTED NOT DETECTED Final   Klebsiella pneumoniae NOT DETECTED NOT DETECTED Final   Proteus species NOT DETECTED NOT DETECTED Final   Salmonella species NOT DETECTED NOT DETECTED Final   Serratia marcescens NOT DETECTED NOT DETECTED Final   Haemophilus influenzae NOT DETECTED NOT DETECTED Final   Neisseria meningitidis NOT DETECTED NOT DETECTED Final   Pseudomonas aeruginosa DETECTED (A) NOT DETECTED Final    Comment: CRITICAL RESULT CALLED TO, READ BACK BY AND VERIFIED WITHBETHA RONAL HENCH PHARMD, AT 9188 06/07/24 D.  VANHOOK    Stenotrophomonas maltophilia NOT DETECTED NOT DETECTED Final   Candida albicans NOT DETECTED NOT DETECTED Final   Candida auris NOT DETECTED NOT DETECTED Final   Candida glabrata NOT DETECTED NOT DETECTED Final   Candida krusei NOT DETECTED NOT DETECTED Final   Candida parapsilosis NOT DETECTED NOT DETECTED Final   Candida tropicalis NOT DETECTED NOT DETECTED Final   Cryptococcus neoformans/gattii NOT DETECTED NOT DETECTED Final   CTX-M ESBL NOT DETECTED NOT DETECTED Final   Carbapenem resistance IMP NOT DETECTED NOT DETECTED Final   Carbapenem resistance KPC NOT DETECTED NOT DETECTED Final   Carbapenem resistance NDM NOT DETECTED NOT DETECTED Final   Carbapenem resistance VIM NOT DETECTED NOT DETECTED Final    Comment: Performed at [facility], [address], [place], [place] [address]   Pertinent Lab seen by me:    Latest Ref Rng & Units 06/07/2024    4:19 AM 06/06/2024   11:12 AM 05/29/2024    7:06 AM  CBC  WBC 4.0 - 10.5 K/uL 8.5  17.5  11.0   Hemoglobin 12.0 - 15.0 g/dL 9.0  88.7  9.6   Hematocrit 36.0 - 46.0 % 28.7  33.7  31.5   Platelets 150 - 400  K/uL 199  298  136       Latest Ref Rng & Units 06/07/2024    4:19 AM 06/06/2024   11:12 AM 05/29/2024    7:06 AM  CMP  Glucose 70 - 99 mg/dL 897  807  97   BUN 6 - 20 mg/dL 11  15  12    Creatinine 0.44 - 1.00 mg/dL 9.50  9.19  9.40   Sodium 135 - 145 mmol/L 142  136  141   Potassium 3.5 - 5.1 mmol/L 3.3  3.5  3.7   Chloride 98 - 111 mmol/L 106  96  104   CO2 22 - 32 mmol/L 25  24  28    Calcium 8.9 - 10.3 mg/dL 8.0  9.0  8.1   Total Protein 6.5 - 8.1 g/dL  7.1  5.5   Total Bilirubin 0.0 - 1.2 mg/dL  0.7  0.7   Alkaline Phos 38 - 126 U/L  144  93   AST 15 - 41 U/L  25  17   ALT 0 - 44 U/L  44  29      Pertinent Imagings/Other Imagings Plain films and CT images have been personally visualized and interpreted; radiology reports have been reviewed. Decision making incorporated into the Impression /  Recommendations.  9/10 Endoscopic US  No sign of significant pathology in the ampulla.  Hyperechoic material suggestive of pneumobilia was visualized in the common bile duct.  No obvious bile duct stone seen, but small stones could be missed in setting of such significant pneumobilia.  There was no sign of significant pathology in the pancreatic head, genu of the pancreas and uncinate process of the pancreas.  CT CHEST ABDOMEN PELVIS WO CONTRAST Result Date: 06/06/2024 CLINICAL DATA:  Sepsis EXAM: CT CHEST, ABDOMEN AND PELVIS WITHOUT CONTRAST TECHNIQUE: Multidetector CT imaging of the chest, abdomen and pelvis was performed following the standard protocol without IV contrast. RADIATION DOSE REDUCTION: This exam was performed according to the departmental dose-optimization program which includes automated exposure control, adjustment of the mA and/or kV according to patient size and/or use of iterative reconstruction technique. COMPARISON:  Multiple exams, including 05/07/2024 FINDINGS: CT CHEST FINDINGS Cardiovascular: Unremarkable Mediastinum/Nodes: Unremarkable Lungs/Pleura: Several scattered lung nodules are new compared to prior 05/07/2024 exam with the larger nodules being cavitary, favoring multifocal cavitary pneumonia or septic emboli. Index nodule anteriorly in the right upper lobe measures 1.4 by 1.0 cm on image 64 series 3. Mild atelectasis in both lower lobes. Musculoskeletal: Unremarkable CT ABDOMEN PELVIS FINDINGS Hepatobiliary: Several scattered hypodense hepatic lesions are observed including an index 1.9 by 1.7 cm lesion in the right hepatic lobe on image 51 series 2 which formerly measured 1.6 by 1.4 cm on the MRI from 05/26/2024. These lesions previously demonstrated mild rim enhancement concerning for abscesses. Pneumobilia is observed, as shown on 05/07/2024. Prior cholecystectomy. Pancreas: Unremarkable Spleen: Unremarkable Adrenals/Urinary Tract: Unremarkable Stomach/Bowel: Prominent stool  throughout the colon favors constipation. Vascular/Lymphatic: Unremarkable Reproductive: Unremarkable Other: No supplemental non-categorized findings. Previous pelvic ascites has resolved. Musculoskeletal: Left eccentric sacral Tarlov cyst. IMPRESSION: 1. Several scattered lung nodules are new compared to prior 05/07/2024 exam with the larger nodules being cavitary, favoring multifocal cavitary pneumonia or septic emboli. 2. Several scattered hypodense hepatic lesions are observed including an index 1.9 by 1.7 cm lesion in the right hepatic lobe which formerly measured 1.6 by 1.4 cm on the MRI from 05/26/2024. These lesions previously demonstrated mild rim enhancement concerning for abscesses. 3. Prominent stool throughout the colon  favors constipation. 4. Pneumobilia, as shown on 05/07/2024. 5. Previous pelvic ascites has resolved. Electronically Signed   By: Ryan Salvage M.D.   On: 06/06/2024 14:13   MR PELVIS W WO CONTRAST Result Date: 05/28/2024 CLINICAL DATA:  Pubic symphysis pain. Sepsis (Klebsiella). History of polymyalgia rheumatica and giant cell arteritis with steroid therapy. EXAM: MRI PELVIS WITHOUT AND WITH CONTRAST TECHNIQUE: Multiplanar multisequence MR imaging of the pelvis was performed both before and after administration of intravenous contrast. CONTRAST:  8mL GADAVIST  GADOBUTROL  1 MMOL/ML IV SOLN COMPARISON:  CT pelvis 05/07/2024 FINDINGS: OSSEOUS STRUCTURES AND JOINTS: 3.4 cm left and 1.9 cm right Tarlov cysts at the S2 level. Low-grade but abnormal edema signal in both pubic bones in the pubic symphysis. There is little in the way of associated intraosseous enhancement although there is some enhancement along the superior and inferior pubic ligaments. Subtle edema tracks along the hip adductor aponeuroses (left greater than right) as on image 28 series 10, and adductor aponeurosis tear cannot be totally excluded. MUSCULOTENDINOUS: Abnormal edema tracks within along the iliopsoas musculature,  gluteus medius and minimus musculature, rectus femoris and proximal vastus musculature, and hip adductor musculature bilaterally, favoring low-grade myositis. No associated significant enhancement. No abscess observed. SACRAL PLEXUS: No impinging lesion is identified involving the sacral plexus or proximal sciatic nerves. OTHER: Mild to moderate pelvic ascites. Infiltrative subcutaneous edema laterally along both sides of the pelvis without drainable process. Incidental 8 mm left lower uterine body fibroid. IMPRESSION: 1. Low-grade but abnormal edema signal in both pubic bones in the pubic symphysis. There is little in the way of associated intraosseous enhancement although there is some enhancement along the superior and inferior pubic ligaments. Possibilities may include osteitis pubis, posttraumatic injury of the pubis, or early septic joint. 2. Subtle edema tracks along the hip adductor aponeuroses (left greater than right), and adductor aponeurosis tear cannot be totally excluded. 3. Abnormal edema tracks within along the iliopsoas musculature, gluteus medius and minimus musculature, rectus femoris and proximal vastus musculature, and hip adductor musculature bilaterally, favoring low-grade myositis. No associated significant enhancement. No abscess observed. 4. Mild to moderate pelvic ascites. 5. Infiltrative subcutaneous edema laterally along both sides of the pelvis without drainable process. 6. Tarlov cysts at the S2 level. Electronically Signed   By: Ryan Salvage M.D.   On: 05/28/2024 10:18   US  BIOPSY (LIVER) Result Date: 05/27/2024 INDICATION: 58 year old with small hepatic lesions that are concerning for small abscesses. EXAM: Ultrasound-guided hepatic lesion/abscess aspiration MEDICATIONS: Moderate sedation ANESTHESIA/SEDATION: Moderate (conscious) sedation was employed during this procedure. A total of Versed  1 mg and fentanyl  25 mcg was administered intravenously at the order of the provider  performing the procedure. Total intra-service moderate sedation time: 11 minutes. Patient's level of consciousness and vital signs were monitored continuously by radiology nurse throughout the procedure under the supervision of the provider performing the procedure. COMPLICATIONS: None immediate. PROCEDURE: Informed written consent was obtained from the patient after a thorough discussion of the procedural risks, benefits and alternatives. All questions were addressed. A timeout was performed prior to the initiation of the procedure. Liver was evaluated with ultrasound. Small hypoechoic lesion or fluid collection in the right hepatic lobe was identified and targeted. Right side of the abdomen was prepped with chlorhexidine  and sterile field was created. Skin was anesthetized using 1% lidocaine . A small incision was made. Using ultrasound guidance, an 18 gauge trocar needle was directed into this right hepatic lesion and 0.5 ml of bloody fluid was aspirated. The  lesion was decompressed based on ultrasound. Needle was removed. Bandage placed over the puncture site. FINDINGS: Small hypoechoic fluid collection in the right hepatic lobe measuring approximately 1.5 cm. Bloody fluid was aspirated. This fluid collection was decompressed after aspiration. IMPRESSION: Ultrasound-guided aspiration of a small hepatic fluid collection/abscess in the right hepatic lobe. Electronically Signed   By: Juliene Balder M.D.   On: 05/27/2024 20:25   DG Pelvis Comp Min 3V Result Date: 05/27/2024 CLINICAL DATA:  Traumatic separation of the pubic symphysis. EXAM: JUDET PELVIS - 3+ VIEW COMPARISON:  Abdomen and pelvis CT dated 05/23/2024. FINDINGS: The symphysis pubis is normal in width. There is some sclerosis in the adjacent pubis on the right and to a significantly lesser degree on the left. No fracture or dislocation. Normal bowel-gas pattern. IMPRESSION: Mild changes of osteitis pubis with no pubic diastasis seen. Electronically Signed    By: Elspeth Bathe M.D.   On: 05/27/2024 09:33   TEMPORAL ARTERY Result Date: 05/27/2024  TEMPORAL ARTERY REPORT Patient Name:  Laurie Terry [facility]  Date of Exam:   05/26/2024 Medical Rec #: [id number]            Accession #:    [id number] Date of Birth: 1966/08/27            Patient Gender: F Patient Age:   23 years Exam Location:  [facility] Procedure:      VAS US  TEMPORTAL ARTERY BILATERAL Referring Phys: ALM APO --------------------------------------------------------------------------------  Indications: Headache (recent diagnosis of giant cell arteritis & PMR on              high-dose steroids). High Risk Factors: Age > 50 yrs.  Comparison Study: No previous exams. Exam limited due to patient's hairline. Performing Technologist: Ezzie Potters RVT, RDMS  Examination Guidelines: Patient in reclined position. 2D, color and spectral doppler sampling in the temporal artery along the hairline and temple in the longitudinal plane. 2D images along the hairline and temple in the transverse plane. Exam is bilateral.  Summary: Absence of a halo sign in the bilateral temporal artery, although not definitive, makes a diagnosis of temporal arteritis unlikely.  *See table(s) above for measurements and observations.  Electronically signed by Eather Popp MD on 05/27/2024 at 7:53:59 AM.   Final    MR ABDOMEN MRCP W WO CONTAST Result Date: 05/26/2024 CLINICAL DATA:  Sepsis (Klebsiella), abscess, liver lesions on recent CT abdomen. History of polymyalgia rheumatica and giant cell arteritis with steroid therapy. EXAM: MRI ABDOMEN WITHOUT AND WITH CONTRAST (INCLUDING MRCP) TECHNIQUE: Multiplanar multisequence MR imaging of the abdomen was performed both before and after the administration of intravenous contrast. Heavily T2-weighted images of the biliary and pancreatic ducts were obtained, and three-dimensional MRCP images were rendered by post processing. CONTRAST:  8mL GADAVIST  GADOBUTROL  1 MMOL/ML IV SOLN COMPARISON:   05/23/2024 FINDINGS: Lower chest: Small bilateral pleural effusions. Upper normal heart size. Hepatobiliary: Prior cholecystectomy. About 8 scattered slightly clustered hypoenhancing lesions in the liver demonstrate rim enhancement concerning for small abscesses given these were not present on the prior exam from 03/29/2022 and in light of the patient's current clinical scenario. Index lesion centrally in the right hepatic lobe is bilobed or with a central septation and measures 1.5 by 1.0 cm. Motion artifact complicates the MRCP assessment. The CBD is not dilated but there are signal hypo intensities in the intrahepatic and extrahepatic biliary tree which most likely reflect pneumobilia based on the findings on recent CT. Pancreas:  Unremarkable Spleen:  Unremarkable  Adrenals/Urinary Tract: No findings of pyelonephritis. Symmetric perirenal stranding. Stomach/Bowel: Unremarkable Vascular/Lymphatic:  Unremarkable Other: Pelvic ascites. Mild upper abdominal ascites and mesenteric stranding. Subcutaneous edema/stranding along both flanks. Musculoskeletal: Mild lumbar spondylosis and degenerative disc disease. S1 Tarlov cysts, left greater than right. IMPRESSION: 1. About 8 scattered slightly clustered hypoenhancing lesions in the liver demonstrate rim enhancement concerning for small abscesses given that these were not present on the prior exam from 03/29/2022 and in light of the patient's current clinical scenario. 2. Complicated assessment of the biliary tree due to pneumobilia and motion artifact. No biliary dilatation observed. 3. Small bilateral pleural effusions. 4. Mild upper abdominal ascites and mesenteric stranding. 5. Subcutaneous edema/stranding along both flanks. 6. Mild lumbar spondylosis and degenerative disc disease. 7. S1 Tarlov cysts, left greater than right. Electronically Signed   By: Ryan Salvage M.D.   On: 05/26/2024 10:29   MR 3D Recon At Scanner Result Date: 05/26/2024 CLINICAL DATA:  Sepsis  (Klebsiella), abscess, liver lesions on recent CT abdomen. History of polymyalgia rheumatica and giant cell arteritis with steroid therapy. EXAM: MRI ABDOMEN WITHOUT AND WITH CONTRAST (INCLUDING MRCP) TECHNIQUE: Multiplanar multisequence MR imaging of the abdomen was performed both before and after the administration of intravenous contrast. Heavily T2-weighted images of the biliary and pancreatic ducts were obtained, and three-dimensional MRCP images were rendered by post processing. CONTRAST:  8mL GADAVIST  GADOBUTROL  1 MMOL/ML IV SOLN COMPARISON:  05/23/2024 FINDINGS: Lower chest: Small bilateral pleural effusions. Upper normal heart size. Hepatobiliary: Prior cholecystectomy. About 8 scattered slightly clustered hypoenhancing lesions in the liver demonstrate rim enhancement concerning for small abscesses given these were not present on the prior exam from 03/29/2022 and in light of the patient's current clinical scenario. Index lesion centrally in the right hepatic lobe is bilobed or with a central septation and measures 1.5 by 1.0 cm. Motion artifact complicates the MRCP assessment. The CBD is not dilated but there are signal hypo intensities in the intrahepatic and extrahepatic biliary tree which most likely reflect pneumobilia based on the findings on recent CT. Pancreas:  Unremarkable Spleen:  Unremarkable Adrenals/Urinary Tract: No findings of pyelonephritis. Symmetric perirenal stranding. Stomach/Bowel: Unremarkable Vascular/Lymphatic:  Unremarkable Other: Pelvic ascites. Mild upper abdominal ascites and mesenteric stranding. Subcutaneous edema/stranding along both flanks. Musculoskeletal: Mild lumbar spondylosis and degenerative disc disease. S1 Tarlov cysts, left greater than right. IMPRESSION: 1. About 8 scattered slightly clustered hypoenhancing lesions in the liver demonstrate rim enhancement concerning for small abscesses given that these were not present on the prior exam from 03/29/2022 and in light of  the patient's current clinical scenario. 2. Complicated assessment of the biliary tree due to pneumobilia and motion artifact. No biliary dilatation observed. 3. Small bilateral pleural effusions. 4. Mild upper abdominal ascites and mesenteric stranding. 5. Subcutaneous edema/stranding along both flanks. 6. Mild lumbar spondylosis and degenerative disc disease. 7. S1 Tarlov cysts, left greater than right. Electronically Signed   By: Ryan Salvage M.D.   On: 05/26/2024 10:29   ECHOCARDIOGRAM COMPLETE Result Date: 05/25/2024    ECHOCARDIOGRAM REPORT   Patient Name:   KELE WITHEM [facility] Date of Exam: 05/25/2024 Medical Rec #:  [id number]           Height:       65.0 in Accession #:    [id number]          Weight:       175.3 lb Date of Birth:  1966-04-26           BSA:  1.870 m Patient Age:    58 years            BP:           106/56 mmHg Patient Gender: F                   HR:           46 bpm. Exam Location:  Inpatient Procedure: 2D Echo, Cardiac Doppler and Color Doppler (Both Spectral and Color            Flow Doppler were utilized during procedure). Indications:    Bacteremia  History:        Patient has prior history of Echocardiogram examinations, most                 recent 11/24/2023. Risk Factors:Dyslipidemia.  Sonographer:    Philomena Daring Referring Phys: [id number] LOGAN N LOCKWOOD IMPRESSIONS  1. Left ventricular ejection fraction, by estimation, is 55 to 60%. The left ventricle has normal function. The left ventricle has no regional wall motion abnormalities. Left ventricular diastolic parameters are indeterminate.  2. Right ventricular systolic function is normal. The right ventricular size is normal.  3. The mitral valve is normal in structure. No evidence of mitral valve regurgitation. No evidence of mitral stenosis.  4. The aortic valve is normal in structure. Aortic valve regurgitation is not visualized. No aortic stenosis is present.  5. There is borderline dilatation of the ascending aorta,  measuring 38 mm.  6. The inferior vena cava is normal in size with greater than 50% respiratory variability, suggesting right atrial pressure of 3 mmHg. Conclusion(s)/Recommendation(s): No evidence of valvular vegetations on this transthoracic echocardiogram. Consider a transesophageal echocardiogram to exclude infective endocarditis if clinically indicated. FINDINGS  Left Ventricle: Left ventricular ejection fraction, by estimation, is 55 to 60%. The left ventricle has normal function. The left ventricle has no regional wall motion abnormalities. The left ventricular internal cavity size was normal in size. There is  no left ventricular hypertrophy. Left ventricular diastolic parameters are indeterminate. Right Ventricle: The right ventricular size is normal. No increase in right ventricular wall thickness. Right ventricular systolic function is normal. Left Atrium: Left atrial size was normal in size. Right Atrium: Right atrial size was normal in size. Pericardium: There is no evidence of pericardial effusion. Mitral Valve: The mitral valve is normal in structure. No evidence of mitral valve regurgitation. No evidence of mitral valve stenosis. Tricuspid Valve: The tricuspid valve is normal in structure. Tricuspid valve regurgitation is not demonstrated. No evidence of tricuspid stenosis. Aortic Valve: The aortic valve is normal in structure. Aortic valve regurgitation is not visualized. No aortic stenosis is present. Pulmonic Valve: The pulmonic valve was normal in structure. Pulmonic valve regurgitation is trivial. No evidence of pulmonic stenosis. Aorta: The aortic root is normal in size and structure. There is borderline dilatation of the ascending aorta, measuring 38 mm. Venous: The inferior vena cava is normal in size with greater than 50% respiratory variability, suggesting right atrial pressure of 3 mmHg. IAS/Shunts: No atrial level shunt detected by color flow Doppler.  LEFT VENTRICLE PLAX 2D LVIDd:          4.20 cm   Diastology LVIDs:         2.60 cm   LV e' medial:    7.07 cm/s LV PW:         1.00 cm   LV E/e' medial:  14.3 LV IVS:  1.00 cm   LV e' lateral:   12.30 cm/s LVOT diam:     2.00 cm   LV E/e' lateral: 8.2 LV SV:         72 LV SV Index:   39 LVOT Area:     3.14 cm  RIGHT VENTRICLE             IVC RV S prime:     10.60 cm/s  IVC diam: 2.10 cm TAPSE (M-mode): 2.1 cm LEFT ATRIUM             Index        RIGHT ATRIUM           Index LA diam:        2.50 cm 1.34 cm/m   RA Area:     14.20 cm LA Vol (A2C):   44.1 ml 23.58 ml/m  RA Volume:   30.60 ml  16.36 ml/m LA Vol (A4C):   37.1 ml 19.84 ml/m LA Biplane Vol: 40.7 ml 21.76 ml/m  AORTIC VALVE LVOT Vmax:   113.00 cm/s LVOT Vmean:  69.900 cm/s LVOT VTI:    0.230 m  AORTA Ao Root diam: 3.80 cm Ao Asc diam:  3.80 cm MITRAL VALVE MV Area (PHT): 4.78 cm     SHUNTS MV E velocity: 101.00 cm/s  Systemic VTI:  0.23 m                             Systemic Diam: 2.00 cm Oneil Parchment MD Electronically signed by Oneil Parchment MD Signature Date/Time: 05/25/2024/4:16:04 PM    Final    CT ABDOMEN PELVIS WO CONTRAST Result Date: 05/24/2024 CLINICAL DATA:  Sepsis EXAM: CT ABDOMEN AND PELVIS WITHOUT CONTRAST TECHNIQUE: Multidetector CT imaging of the abdomen and pelvis was performed following the standard protocol without IV contrast. RADIATION DOSE REDUCTION: This exam was performed according to the departmental dose-optimization program which includes automated exposure control, adjustment of the mA and/or kV according to patient size and/or use of iterative reconstruction technique. COMPARISON:  CT chest abdomen and pelvis 05/07/2024. CT abdomen and pelvis 03/29/2022. FINDINGS: Lower chest: There is a trace right pleural effusion. Hepatobiliary: Patient is status post cholecystectomy. Pneumobilia is again seen. There are 5 new rounded hypodensities in the right lobe of the liver which are too small to characterize, indeterminate. Pancreas: Unremarkable. No pancreatic  ductal dilatation or surrounding inflammatory changes. Spleen: Normal in size without focal abnormality. Adrenals/Urinary Tract: There is mild bilateral perinephric fat stranding, mildly increased from prior. No urinary tract calculus or hydronephrosis. The adrenal glands and bladder are within normal limits. Stomach/Bowel: Stomach is within normal limits. Appendix appears normal. No evidence of bowel wall thickening, distention, or inflammatory changes. Vascular/Lymphatic: No significant vascular findings are present. No enlarged abdominal or pelvic lymph nodes. Reproductive: Uterus and bilateral adnexa are unremarkable. Other: There is a small amount of free fluid in the pelvis. There is no focal abdominal wall hernia. Musculoskeletal: No acute or significant osseous findings. IMPRESSION: 1. Mild bilateral perinephric fat stranding, mildly increased from prior. Correlate clinically for pyelonephritis. 2. Small amount of free fluid in the pelvis. 3. Trace right pleural effusion. 4. New subcentimeter hypodensities in the liver are too small to characterize. Given that these were not definitely seen on 05/07/2024, differential diagnosis includes infectious etiologies. Electronically Signed   By: Greig Pique M.D.   On: 05/24/2024 00:29   DG CHEST PORT 1 VIEW Result Date:  05/22/2024 EXAM: 1 VIEW XRAY OF THE CHEST 05/22/2024 07:44:00 AM COMPARISON: 05/07/2024 Stable left lung base linear opacities compatible with atelectasis/scar. CLINICAL HISTORY: Fever [phone]. Per chart: Patient c/o weakness x 1 week. Patient was seen by PCP today and recommended to be seen in ED for low platelet count. Patient report nausea, denies vomiting. Patient denies Chest pain and SOB. FINDINGS: LUNGS AND PLEURA: No focal pulmonary opacity. No pulmonary edema. No pleural effusion. No pneumothorax. HEART AND MEDIASTINUM: No acute abnormality of the cardiac and mediastinal silhouettes. BONES AND SOFT TISSUES: No acute osseous abnormality.  IMPRESSION: 1. No acute findings. 2. Stable left lung base linear opacities compatible with atelectasis/scar. Electronically signed by: Waddell Calk MD 05/22/2024 07:59 AM EDT RP Workstation: [id number]   I spent 85 minutes involved in face-to-face and non-face-to-face activities for this patient on the day of the visit. Professional time spent includes the following activities: Preparing to see the patient (review of tests), Obtaining and reviewing separately obtained history (discharge record 9/7, current ED note, H&P, hospitalist progress note), Performing a medically appropriate examination and evaluation, Ordering medications/labs, referring and communicating with other health care professionals including primary and ID pharmacy, Documenting clinical information in the EMR, Independently interpreting results (not separately reported), Communicating results to the patient/family, Counseling and educating the patient/family and Care coordination (not separately reported).  Electronically signed by:   Plan d/w requesting provider as well as ID pharm D  Of note, portions of this note may have been created with voice recognition software. While this note has been edited for accuracy, occasional wrong-word or 'sound-a-like' substitutions may have occurred due to the inherent limitations of voice recognition software.   Annalee Orem, MD Infectious Disease Physician [facility] Pager: [phone]

## 2024-06-07 NOTE — Progress Notes (Signed)
 PHARMACY - PHYSICIAN COMMUNICATION CRITICAL VALUE ALERT - BLOOD CULTURE IDENTIFICATION (BCID)  Laurie Terry is an 58 y.o. female who presented to [facility] on 06/06/2024 with a chief complaint of lightheadedness and hypotension  Assessment:  PSA bacteremia  Name of physician (or Provider) Contacted: ID and Dr. Jadine  Current antibiotics: Dapt and Rocephin   Changes to prescribed antibiotics recommended:  Increase Daptomycin  to 700mg /24h  D/c Rocephin  Start Cefepime  2g IV q8hr    Results for orders placed or performed during the hospital encounter of 06/06/24  Blood Culture ID Panel (Reflexed) (Collected: 06/06/2024 12:03 PM)  Result Value Ref Range   Enterococcus faecalis NOT DETECTED NOT DETECTED   Enterococcus Faecium NOT DETECTED NOT DETECTED   Listeria monocytogenes NOT DETECTED NOT DETECTED   Staphylococcus species NOT DETECTED NOT DETECTED   Staphylococcus aureus (BCID) NOT DETECTED NOT DETECTED   Staphylococcus epidermidis NOT DETECTED NOT DETECTED   Staphylococcus lugdunensis NOT DETECTED NOT DETECTED   Streptococcus species NOT DETECTED NOT DETECTED   Streptococcus agalactiae NOT DETECTED NOT DETECTED   Streptococcus pneumoniae NOT DETECTED NOT DETECTED   Streptococcus pyogenes NOT DETECTED NOT DETECTED   A.calcoaceticus-baumannii NOT DETECTED NOT DETECTED   Bacteroides fragilis NOT DETECTED NOT DETECTED   Enterobacterales NOT DETECTED NOT DETECTED   Enterobacter cloacae complex NOT DETECTED NOT DETECTED   Escherichia coli NOT DETECTED NOT DETECTED   Klebsiella aerogenes NOT DETECTED NOT DETECTED   Klebsiella oxytoca NOT DETECTED NOT DETECTED   Klebsiella pneumoniae NOT DETECTED NOT DETECTED   Proteus species NOT DETECTED NOT DETECTED   Salmonella species NOT DETECTED NOT DETECTED   Serratia marcescens NOT DETECTED NOT DETECTED   Haemophilus influenzae NOT DETECTED NOT DETECTED   Neisseria meningitidis NOT DETECTED NOT DETECTED   Pseudomonas aeruginosa  DETECTED (A) NOT DETECTED   Stenotrophomonas maltophilia NOT DETECTED NOT DETECTED   Candida albicans NOT DETECTED NOT DETECTED   Candida auris NOT DETECTED NOT DETECTED   Candida glabrata NOT DETECTED NOT DETECTED   Candida krusei NOT DETECTED NOT DETECTED   Candida parapsilosis NOT DETECTED NOT DETECTED   Candida tropicalis NOT DETECTED NOT DETECTED   Cryptococcus neoformans/gattii NOT DETECTED NOT DETECTED   CTX-M ESBL NOT DETECTED NOT DETECTED   Carbapenem resistance IMP NOT DETECTED NOT DETECTED   Carbapenem resistance KPC NOT DETECTED NOT DETECTED   Carbapenem resistance NDM NOT DETECTED NOT DETECTED   Carbapenem resistance VIM NOT DETECTED NOT DETECTED   Kyoko Elsea Karoline Marina, PharmD, BCPS Clinical Staff Pharmacist  Marina Salines [facility] 06/07/2024  8:47 AM

## 2024-06-07 NOTE — Progress Notes (Signed)
  Progress Note   Patient: Laurie Terry FMW:[id number] DOB: 10-22-1965 DOA: 06/06/2024     1 DOS: the patient was seen and examined on 06/07/2024   Brief hospital course: Laurie Terry is a 58 y.o. female with medical history significant for recent clinically diagnosed giant cell arteritis and PMR on prednisone , recent Klebsiella pneumonia bacteremia, Enterococcus liver abscess, osteitis pubis who is admitted with sepsis.  Assessment and Plan: Severe sepsis Septic emboli/multifocal cavitary pneumonia Ongoing hepatic abscesses Recent Klebsiella bacteremia/Enterococcus liver abscess/osteitis pubis: Patient with recent admission for Klebsiella bacteremia and liver abscesses, aspirate of which grew Enterococcus faecalis.  She has been on amoxicillin  and cefadroxil  as an outpatient.   CT chest/abd/pelvis changes suspicious for pulmonary septic emboli as well as persistent hepatic lesions concerning for abscesses. Appears clinically stable.  Plan as per ID including antibiotics and TEE.  Allergic reaction to cefepime  Antibiotics changed by ID.  Allergic reaction appears nearly resolved.   GCS  PMR Clinical diagnosis by her rheumatologist [name] with elevated inflammatory markers.  She has been on prednisone  50 mg and atovaquone .  Will discuss with rheumatology   Normocytic anemia Hemoglobin stable at 9     Subjective:  Reports developing allergic reaction quickly after initiation of cefepime  this morning, itching a little over some rash developing on legs, reports swelling of face and tongue.  Infusion was stopped.  Feeling much better now.  Physical Exam: Vitals:   06/06/24 2028 06/06/24 2235 06/07/24 0321 06/07/24 0637  BP: 99/70 94/63 91/62  102/63  Pulse: 72 66 73 75  Resp: 20 18 18 18   Temp: 98.3 F (36.8 C) 97.9 F (36.6 C) 98.4 F (36.9 C) 97.7 F (36.5 C)  TempSrc: Oral Oral Oral Oral  SpO2: 98% 97% 96% 97%  Weight:      Height:       Physical  Exam Vitals reviewed.  Constitutional:      General: She is not in acute distress.    Appearance: She is not ill-appearing or toxic-appearing.  HENT:     Mouth/Throat:     Comments: No apparent swelling of lips or tongue. Perhaps mild facial swelling. Cardiovascular:     Rate and Rhythm: Normal rate and regular rhythm.     Heart sounds: No murmur heard. Pulmonary:     Effort: Pulmonary effort is normal. No respiratory distress.     Breath sounds: No wheezing, rhonchi or rales.  Neurological:     Mental Status: She is alert.  Psychiatric:        Mood and Affect: Mood normal.        Behavior: Behavior normal.     Data Reviewed: Potassium 3.3 remainder BMP unremarkable Hemoglobin stable 9.0 Lactic acid down from 3.8-2.9 on last check Blood cultures with gram-negative rods, BC ID Pseudomonas  Family Communication: husband at bedsdie  Disposition: Status is: Inpatient Remains inpatient appropriate because: bacteremia     Time spent: 35 minutes  Author: Toribio Door, MD 06/07/2024 8:02 AM  For on call review [email or web address].

## 2024-06-07 NOTE — Progress Notes (Signed)
 Pharmacy Antibiotic Note  Laurie Terry is a 58 y.o. female admitted on 06/06/2024 with PsA bacteremia and known prior liver abscess growing Kleb PNA + enterococcus faecium. Pharmacy has been consulted for Meropenem  and Daptomycin  dosing.  The patient had a reaction to cefepime  this morning of itching however has previously tolerated other cephalosporins such as ceftriaxone .   She received 1 dose of Zosyn  however the plan is now to transition to Meropenem  pending pseudomonas sensitivities.    Plan: - Adjust Daptomycin  to 700 mg (~10 mg/kg) IV every 24 hours - Start Meropenem  1g IV every 8 hours  -Will continue to follow renal function, culture results, LOT, and antibiotic de-escalation plans   Height: 5' 5 (165.1 cm) Weight: 71.8 kg (158 lb 4.6 oz) IBW/kg (Calculated) : 57  Temp (24hrs), Avg:98.2 F (36.8 C), Min:97.7 F (36.5 C), Max:98.5 F (36.9 C)  Recent Labs  Lab 06/06/24 1112 06/06/24 1203 06/06/24 1529 06/07/24 0419  WBC 17.5*  --   --  8.5  CREATININE 0.80  --   --  0.49  LATICACIDVEN  --  3.8* 2.9*  --     Estimated Creatinine Clearance: 75.2 mL/min (by C-G formula based on SCr of 0.49 mg/dL).    Allergies  Allergen Reactions   Contrast Media [Iodinated Contrast Media] Shortness Of Breath    Low blood pressure    Midodrine  Shortness Of Breath   Other Hives and Swelling    Seafood   Vancomycin  Rash, Other (See Comments) and Cough    Patient develops cough, rash ears, tingling lips after receiving Cefepime  and vancomycin  01/23/2022   Augmentin  [Amoxicillin -Pot Clavulanate] Nausea And Vomiting   Avocado Itching    Tongue and mouth   Levofloxacin Hives    Pt already has aortic aneurysm.   Pantoprazole  Other (See Comments)    Joint aches, fever, peeling of skin   Sulfa Antibiotics Hives    Per pt   Colestipol  Hcl Rash    Antimicrobials this admission: Ceftriaxone  9/14 x 1 Cefepime  9/15 x 1 (full dose not given d/t itching) Zosyn  9/15 x 1 Meropenem   9/15 >> Daptomycin  9/15 >>  Microbiology results: 9/14 BCx >> 4/4 GNR (BCID Pseudomonas aeruginosa)  Thank you for allowing pharmacy to be a part of this patient's care.  Almarie Lunger, PharmD, BCPS, BCIDP Infectious Diseases Clinical Pharmacist 06/07/2024 1:10 PM   **Pharmacist phone directory can now be found on [email or web address] ([email or web address]).  Listed under [facility].

## 2024-06-08 ENCOUNTER — Encounter: Admitting: Physical Therapy

## 2024-06-08 DIAGNOSIS — R7881 Bacteremia: Secondary | ICD-10-CM | POA: Diagnosis not present

## 2024-06-08 DIAGNOSIS — B965 Pseudomonas (aeruginosa) (mallei) (pseudomallei) as the cause of diseases classified elsewhere: Secondary | ICD-10-CM

## 2024-06-08 DIAGNOSIS — M353 Polymyalgia rheumatica: Secondary | ICD-10-CM | POA: Diagnosis not present

## 2024-06-08 DIAGNOSIS — K750 Abscess of liver: Secondary | ICD-10-CM | POA: Insufficient documentation

## 2024-06-08 DIAGNOSIS — M316 Other giant cell arteritis: Secondary | ICD-10-CM | POA: Insufficient documentation

## 2024-06-08 DIAGNOSIS — I76 Septic arterial embolism: Secondary | ICD-10-CM | POA: Diagnosis not present

## 2024-06-08 MED ORDER — PREDNISONE 5 MG PO TABS
15.0000 mg | ORAL_TABLET | Freq: Every day | ORAL | Status: DC
  Administered 2024-06-09 – 2024-06-14 (×6): 15 mg via ORAL
  Filled 2024-06-08 (×6): qty 3

## 2024-06-08 MED ORDER — SODIUM CHLORIDE 0.9 % IV SOLN: INTRAVENOUS | Status: DC

## 2024-06-08 MED ORDER — DAPTOMYCIN-SODIUM CHLORIDE 700-0.9 MG/100ML-% IV SOLN
700.0000 mg | Freq: Every day | INTRAVENOUS | Status: DC
  Administered 2024-06-08 – 2024-06-09 (×2): 700 mg via INTRAVENOUS
  Filled 2024-06-08 (×3): qty 100

## 2024-06-08 NOTE — Progress Notes (Addendum)
  Progress Note   Patient: Laurie Terry FMW:[id number] DOB: [birth year] DOA: 06/06/2024     2 DOS: the patient was seen and examined on 06/08/2024   Brief hospital course: M66 year old woman PMH including giant cell arteritis and PMR on prednisone , recent Klebsiella bacteremia and Enterococcus liver abscess, admitted for sepsis.  Found to have Pseudomonas bacteremia, enlarging liver abscess and new septic pulmonary emboli.  Seen by ID, plan for TEE.  Consultants ID  Procedures/Events  Assessment and Plan: Severe sepsis Pseudomonas bacteremia Septic emboli/multifocal cavitary pneumonia Ongoing hepatic abscesses Recent Klebsiella bacteremia/Enterococcus liver abscess Patient with recent admission for Klebsiella bacteremia and liver abscesses, aspirate of which grew Enterococcus faecalis.  She has been on amoxicillin  and cefadroxil  as an outpatient.   CT chest/abd/pelvis changes suspicious for pulmonary septic emboli as well as persistent hepatic lesions concerning for abscesses. Clinically much improved, continue antibiotics as per infectious disease.  Plan for TEE 9/17.   Allergic reaction to cefepime  Antibiotics changed by ID.  Allergic reaction appears resolved.   GCS  PMR Clinical diagnosis by her rheumatologist [name] with elevated inflammatory markers.  She has been on prednisone  50 mg and atovaquone .  Discussed with [name] today by telephone, risk/benefit of ongoing prednisone  at high dose discussed, decision making conducted with patient, all concur that decrease prednisone  is in the patient's best interest.  [name] recommended dropping dose to 15 mg daily (PMR dose)   Normocytic anemia Hemoglobin stable at 9 on last check       Subjective:  Feels better Working from computer Overall much better, has some pain RUQ  Physical Exam: Vitals:   06/07/24 2206 06/08/24 0425 06/08/24 0900 06/08/24 1330  BP: (!) 106/54 114/64 98/64 101/62  Pulse: 72 61 98  88  Resp: 18 18 16 17   Temp: 98.2 F (36.8 C) 98.1 F (36.7 C) 98.3 F (36.8 C) 98.3 F (36.8 C)  TempSrc:   Oral Oral  SpO2: 98% 98% 98% 98%  Weight:      Height:       Physical Exam Vitals reviewed.  Constitutional:      General: She is not in acute distress.    Appearance: She is not ill-appearing or toxic-appearing.  Cardiovascular:     Rate and Rhythm: Normal rate and regular rhythm.     Heart sounds: No murmur heard. Pulmonary:     Effort: Pulmonary effort is normal. No respiratory distress.     Breath sounds: No wheezing, rhonchi or rales.  Neurological:     Mental Status: She is alert.  Psychiatric:        Mood and Affect: Mood normal.        Behavior: Behavior normal.     Data Reviewed: No new data Blood cultures Pseudomonas  Family Communication: none  Disposition: Status is: Inpatient Remains inpatient appropriate because: Pseudomonas bacteremia     Time spent: 35 minutes  Author: Toribio Door, MD 06/08/2024 3:19 PM  For on call review [email or web address].

## 2024-06-08 NOTE — Progress Notes (Signed)
 Patient had questions regarding current steroid dosage and combination of antibiotics. Discussed her questions/concerns and she will continue to address them with ID team regarding plan for transition to oral antibiotics and continuation of prednisone .

## 2024-06-08 NOTE — Plan of Care (Signed)

## 2024-06-08 NOTE — H&P (View-Only) (Signed)
  Progress Note   Patient: Laurie Terry FMW:[id number] DOB: [birth year] DOA: 06/06/2024     2 DOS: the patient was seen and examined on 06/08/2024   Brief hospital course: M66 year old woman PMH including giant cell arteritis and PMR on prednisone , recent Klebsiella bacteremia and Enterococcus liver abscess, admitted for sepsis.  Found to have Pseudomonas bacteremia, enlarging liver abscess and new septic pulmonary emboli.  Seen by ID, plan for TEE.  Consultants ID  Procedures/Events  Assessment and Plan: Severe sepsis Pseudomonas bacteremia Septic emboli/multifocal cavitary pneumonia Ongoing hepatic abscesses Recent Klebsiella bacteremia/Enterococcus liver abscess Patient with recent admission for Klebsiella bacteremia and liver abscesses, aspirate of which grew Enterococcus faecalis.  She has been on amoxicillin  and cefadroxil  as an outpatient.   CT chest/abd/pelvis changes suspicious for pulmonary septic emboli as well as persistent hepatic lesions concerning for abscesses. Clinically much improved, continue antibiotics as per infectious disease.  Plan for TEE 9/17.   Allergic reaction to cefepime  Antibiotics changed by ID.  Allergic reaction appears resolved.   GCS  PMR Clinical diagnosis by her rheumatologist [name] with elevated inflammatory markers.  She has been on prednisone  50 mg and atovaquone .  Discussed with [name] today by telephone, risk/benefit of ongoing prednisone  at high dose discussed, decision making conducted with patient, all concur that decrease prednisone  is in the patient's best interest.  [name] recommended dropping dose to 15 mg daily (PMR dose)   Normocytic anemia Hemoglobin stable at 9 on last check       Subjective:  Feels better Working from computer Overall much better, has some pain RUQ  Physical Exam: Vitals:   06/07/24 2206 06/08/24 0425 06/08/24 0900 06/08/24 1330  BP: (!) 106/54 114/64 98/64 101/62  Pulse: 72 61 98  88  Resp: 18 18 16 17   Temp: 98.2 F (36.8 C) 98.1 F (36.7 C) 98.3 F (36.8 C) 98.3 F (36.8 C)  TempSrc:   Oral Oral  SpO2: 98% 98% 98% 98%  Weight:      Height:       Physical Exam Vitals reviewed.  Constitutional:      General: She is not in acute distress.    Appearance: She is not ill-appearing or toxic-appearing.  Cardiovascular:     Rate and Rhythm: Normal rate and regular rhythm.     Heart sounds: No murmur heard. Pulmonary:     Effort: Pulmonary effort is normal. No respiratory distress.     Breath sounds: No wheezing, rhonchi or rales.  Neurological:     Mental Status: She is alert.  Psychiatric:        Mood and Affect: Mood normal.        Behavior: Behavior normal.     Data Reviewed: No new data Blood cultures Pseudomonas  Family Communication: none  Disposition: Status is: Inpatient Remains inpatient appropriate because: Pseudomonas bacteremia     Time spent: 35 minutes  Author: Toribio Door, MD 06/08/2024 3:19 PM  For on call review [email or web address].

## 2024-06-08 NOTE — Progress Notes (Signed)
   [facility] has been requested to perform a transesophageal echocardiogram on Laurie Terry for bacteremia.    The patient does NOT have any absolute or relative contraindications to a Transesophageal Echocardiogram (TEE).  The patient has: No other conditions that may impact this procedure.    After careful review of history and examination, the risks and benefits of transesophageal echocardiogram have been explained including risks of esophageal damage, perforation (1:10,000 risk), bleeding, pharyngeal hematoma as well as other potential complications associated with conscious sedation including aspiration, arrhythmia, respiratory failure and death. Alternatives to treatment were discussed, questions were answered. Patient is willing to proceed.  Signed, Waddell DELENA Donath, PA-C  06/08/2024 3:08 PM

## 2024-06-09 ENCOUNTER — Inpatient Hospital Stay (HOSPITAL_COMMUNITY)

## 2024-06-09 ENCOUNTER — Encounter (HOSPITAL_COMMUNITY)
Admission: EM | Disposition: A | Discharge status: Home Health | Payer: Self-pay | Source: Home / Self Care | Attending: Internal Medicine

## 2024-06-09 DIAGNOSIS — I330 Acute and subacute infective endocarditis: Secondary | ICD-10-CM

## 2024-06-09 DIAGNOSIS — I340 Nonrheumatic mitral (valve) insufficiency: Secondary | ICD-10-CM

## 2024-06-09 DIAGNOSIS — R7881 Bacteremia: Secondary | ICD-10-CM | POA: Diagnosis not present

## 2024-06-09 DIAGNOSIS — I491 Atrial premature depolarization: Secondary | ICD-10-CM | POA: Diagnosis not present

## 2024-06-09 DIAGNOSIS — A419 Sepsis, unspecified organism: Secondary | ICD-10-CM | POA: Diagnosis not present

## 2024-06-09 DIAGNOSIS — R932 Abnormal findings on diagnostic imaging of liver and biliary tract: Secondary | ICD-10-CM | POA: Diagnosis not present

## 2024-06-09 DIAGNOSIS — R6520 Severe sepsis without septic shock: Secondary | ICD-10-CM | POA: Diagnosis not present

## 2024-06-09 DIAGNOSIS — K750 Abscess of liver: Secondary | ICD-10-CM | POA: Diagnosis not present

## 2024-06-09 DIAGNOSIS — B965 Pseudomonas (aeruginosa) (mallei) (pseudomallei) as the cause of diseases classified elsewhere: Secondary | ICD-10-CM | POA: Diagnosis not present

## 2024-06-09 HISTORY — PX: TRANSESOPHAGEAL ECHOCARDIOGRAM (CATH LAB): EP1270

## 2024-06-09 LAB — CULTURE, BLOOD (ROUTINE X 2): Special Requests: ADEQUATE

## 2024-06-09 LAB — ECHO TEE

## 2024-06-09 SURGERY — TRANSESOPHAGEAL ECHOCARDIOGRAM (TEE) (CATHLAB)
Anesthesia: Monitor Anesthesia Care

## 2024-06-09 MED ORDER — EPHEDRINE SULFATE-NACL 50-0.9 MG/10ML-% IV SOSY
PREFILLED_SYRINGE | INTRAVENOUS | Status: DC | PRN
Start: 2024-06-09 — End: 2024-06-09
  Administered 2024-06-09: 7.5 mg via INTRAVENOUS

## 2024-06-09 MED ORDER — PROPOFOL 10 MG/ML IV BOLUS
INTRAVENOUS | Status: DC | PRN
  Administered 2024-06-09: 175 ug/kg/min via INTRAVENOUS
  Administered 2024-06-09: 120 mg via INTRAVENOUS

## 2024-06-09 MED ORDER — PHENOL 1.4 % MT LIQD
1.0000 | OROMUCOSAL | Status: DC | PRN
  Administered 2024-06-09: 1 via OROMUCOSAL
  Filled 2024-06-09: qty 177

## 2024-06-09 MED ORDER — LIDOCAINE 2% (20 MG/ML) 5 ML SYRINGE
INTRAMUSCULAR | Status: DC | PRN
  Administered 2024-06-09: 100 mg via INTRAVENOUS

## 2024-06-09 MED ORDER — BENZOCAINE-MENTHOL 6-10 MG MT LOZG
1.0000 | LOZENGE | OROMUCOSAL | Status: DC | PRN
  Administered 2024-06-09: 1 via ORAL
  Filled 2024-06-09: qty 9

## 2024-06-09 NOTE — Anesthesia Procedure Notes (Signed)
 Procedure Name: MAC Date/Time: 06/09/2024 11:44 AM  Performed by: Viviana Almarie DASEN, CRNAPre-anesthesia Checklist: Patient identified, Suction available, Patient being monitored, Emergency Drugs available and Timeout performed Patient Re-evaluated:Patient Re-evaluated prior to induction Oxygen Delivery Method: Nasal cannula Induction Type: IV induction Airway Equipment and Method: Bite block Placement Confirmation: positive ETCO2

## 2024-06-09 NOTE — Op Note (Signed)
 INDICATIONS: infective endocarditis  PROCEDURE:   Informed consent was obtained prior to the procedure. The risks, benefits and alternatives for the procedure were discussed and the patient comprehended these risks.  Risks include, but are not limited to, cough, sore throat, vomiting, nausea, somnolence, esophageal and stomach trauma or perforation, bleeding, low blood pressure, aspiration, pneumonia, infection, trauma to the teeth and death.    During this procedure the patient was administered IV propofol  by Anesthesiology, Dr. Epifanio.  The transesophageal probe was inserted in the esophagus and stomach without difficulty and multiple views were obtained.  The patient was kept under observation until the patient left the procedure room.  The patient left the procedure room in stable condition.   Agitated microbubble saline contrast was not administered.  COMPLICATIONS:    There were no immediate complications.  FINDINGS:  Normal TEE. Incidental note of a small PFO with left to right shunting. No vegetations are seen.  RECOMMENDATIONS:     No TEE evidence of endocarditis.  Time Spent Directly with the Patient:  45 minutes   Laurie Terry 06/09/2024, 12:01 PM

## 2024-06-09 NOTE — Addendum Note (Signed)
 Addendum  created 06/09/24 1242 by Epifanio Fallow, MD   Clinical Note Signed, Delete clinical note

## 2024-06-09 NOTE — Transfer of Care (Signed)
 Immediate Anesthesia Transfer of Care Note  Patient: Laurie Terry  Procedure(s) Performed: TRANSESOPHAGEAL ECHOCARDIOGRAM  Patient Location: PACU and Cath Lab  Anesthesia Type:MAC  Level of Consciousness: awake, alert , oriented, and patient cooperative  Airway & Oxygen Therapy: Patient Spontanous Breathing  Post-op Assessment: Report given to RN, Post -op Vital signs reviewed and stable, Patient moving all extremities X 4, and Patient able to stick tongue midline  Post vital signs: Reviewed and stable  Last Vitals:  Vitals Value Taken Time  BP 104/63   Temp 97.9   Pulse 96   Resp 14   SpO2 98     Last Pain:  Vitals:   06/09/24 1143  TempSrc:   PainSc: 0-No pain         Complications: No notable events documented.

## 2024-06-09 NOTE — Anesthesia Postprocedure Evaluation (Signed)
 Anesthesia Post Note  Patient: MARGURETE GUAMAN  Procedure(s) Performed: TRANSESOPHAGEAL ECHOCARDIOGRAM     Patient location during evaluation: Cath Lab Anesthesia Type: MAC Level of consciousness: awake and alert Pain management: pain level controlled Vital Signs Assessment: post-procedure vital signs reviewed and stable Respiratory status: spontaneous breathing, nonlabored ventilation and respiratory function stable Cardiovascular status: stable and blood pressure returned to baseline Postop Assessment: no apparent nausea or vomiting Anesthetic complications: no   No notable events documented.  Last Vitals:  Vitals:   06/09/24 1230 06/09/24 1236  BP: (!) 107/57   Pulse: 72 66  Resp: 12 13  Temp:  (!) 36.4 C  SpO2: 98% 99%    Last Pain:  Vitals:   06/09/24 1236  TempSrc: Axillary  PainSc:                  Eberardo Demello,W. EDMOND

## 2024-06-09 NOTE — Progress Notes (Signed)
 Echocardiogram Echocardiogram Transesophageal has been performed.  Laurie Terry 06/09/2024, 12:06 PM

## 2024-06-09 NOTE — Progress Notes (Signed)
 [facility] arrived to transport pt back to [facility], report given.

## 2024-06-09 NOTE — Progress Notes (Signed)
 Progress Note   Patient: Laurie Terry FMW:[id number] DOB: December 28, 1965 DOA: 06/06/2024     3 DOS: the patient was seen and examined on 06/09/2024     Brief hospital course: M74 year old woman PMH including giant cell arteritis and PMR on prednisone , recent Klebsiella bacteremia and Enterococcus liver abscess, admitted for sepsis.  Found to have Pseudomonas bacteremia, enlarging liver abscess and new septic pulmonary emboli.  By infectious disease status post TEE on 06/09/2024  Consultants ID   Procedures/Events   Assessment and Plan: Severe sepsis Pseudomonas bacteremia Septic emboli/multifocal cavitary pneumonia Ongoing hepatic abscesses Recent Klebsiella bacteremia/Enterococcus liver abscess Patient with recent admission for Klebsiella bacteremia and liver abscesses, aspirate of which grew Enterococcus faecalis.  She has been on amoxicillin  and cefadroxil  as an outpatient.   CT chest/abd/pelvis changes suspicious for pulmonary septic emboli as well as persistent hepatic lesions concerning for abscesses. Clinically much improved, continue antibiotics as per infectious disease. Patient underwent TEE on 06/09/2024 that did not show any vegetations which showed small PFO with left-to-right shunting. Continue daptomycin  and meropenem    Allergic reaction to cefepime  Antibiotics changed by ID.  Allergic reaction appears resolved.   GCS  PMR Clinical diagnosis by her rheumatologist [name] with elevated inflammatory markers.  She has been on prednisone  50 mg and atovaquone .  Discussed with [name] and, risk/benefit of ongoing prednisone  at high dose discussed, decision making conducted with patient, all concur that decrease prednisone  is in the patient's best interest.  [name] recommended dropping dose to 15 mg daily (PMR dose)   Normocytic anemia Continue to monitor CBC closely    Family Communication: none   Disposition: Status is: Inpatient Remains inpatient  appropriate because: Pseudomonas bacteremia   Subjective:  Feels better Working from computer Overall much better, has some pain RUQ    Physical Exam Vitals reviewed.  Constitutional:      General: She is not in acute distress.    Appearance: She is not ill-appearing or toxic-appearing.  Cardiovascular:     Rate and Rhythm: Normal rate and regular rhythm.     Heart sounds: No murmur heard. Pulmonary:     Effort: Pulmonary effort is normal. No respiratory distress.     Breath sounds: No wheezing, rhonchi or rales.  Neurological:     Mental Status: She is alert.  Psychiatric:        Mood and Affect: Mood normal.        Behavior: Behavior normal.       Data Reviewed:     Latest Ref Rng & Units 06/07/2024    4:19 AM 06/06/2024   11:12 AM 05/29/2024    7:06 AM  BMP  Glucose 70 - 99 mg/dL 897  807  97   BUN 6 - 20 mg/dL 11  15  12    Creatinine 0.44 - 1.00 mg/dL 9.50  9.19  9.40   Sodium 135 - 145 mmol/L 142  136  141   Potassium 3.5 - 5.1 mmol/L 3.3  3.5  3.7   Chloride 98 - 111 mmol/L 106  96  104   CO2 22 - 32 mmol/L 25  24  28    Calcium 8.9 - 10.3 mg/dL 8.0  9.0  8.1        Latest Ref Rng & Units 06/07/2024    4:19 AM 06/06/2024   11:12 AM 05/29/2024    7:06 AM  CBC  WBC 4.0 - 10.5 K/uL 8.5  17.5  11.0   Hemoglobin 12.0 -  15.0 g/dL 9.0  88.7  9.6   Hematocrit 36.0 - 46.0 % 28.7  33.7  31.5   Platelets 150 - 400 K/uL 199  298  136     Vitals:   06/09/24 1215 06/09/24 1230 06/09/24 1236 06/09/24 1324  BP: 104/68 (!) 107/57  118/63  Pulse: 80 72 66 78  Resp: 17 12 13 14   Temp:   (!) 97.5 F (36.4 C) 97.9 F (36.6 C)  TempSrc:   Axillary Oral  SpO2: 99% 98% 99% 99%  Weight:      Height:        Author: Drue ONEIDA Potter, MD 06/09/2024 3:48 PM  For on call review [email or web address].

## 2024-06-09 NOTE — Progress Notes (Signed)
 [facility] Infectious Diseases Follow Up Note  Patient Identification: Patient Name: Laurie Terry MRN: [id number] Admit Date: 06/06/2024 11:00 AM Age: 58 y.o.Today's Date: 06/09/2024  Reason for Visit: Pseudomonas bacteremia  Principal Problem:   Severe sepsis [facility]) Active Problems:   Bacteremia due to Pseudomonas   Normocytic anemia   PMR (polymyalgia rheumatica) (HCC)   Liver abscess   Temporal giant cell arteritis (HCC)  Antibiotics:  Daptomycin  9/14 Ceftriaxone  9/14 Not on fidaxmicin   Lines/Hardware:   Interval Events: Remains afebrile Repeat blood culture sent No labs today   Assessment 58 year old female with prior history with recently diagnosed giant cell arteritis with PMR on prednisone  and prophylactic atovaquone , h/o intra-abdominal abscess (subhepatic/perinephric, Cx with E faecalis and Candida albicans) following ERCP in 2023 treated with Augmentin  and fluconazole  and most recentl admission 8/29-9/7 with Klebsiella pneumonia bacteremia, Enterococcus hepatic abscess, osteitis pubis ( discharged on PO amoxicillin  and cefadroxil ) who presented to the ED on 8/14 with fatigue, lightheadedness for few days.   # PsA bacteremia with CT showing with concerns for multifocal cavitary pneumonia/septic emboli as well as hepatic abscesses - Hepatic abscesses appear to be small in size for IR drainage - 9/17 TEE prelim with no vegetation, small PFO with left-to-right shunt, per cardiology Comments - likely GI source of recurrent infection     Recommendations - Continue daptomycin , meropenem  - Monitor CBC, CMP and CPK - Follow-up repeat blood cultures from 9/17.  Needs to be negative for at least 72 hours - Fu final TEE results - Universal/standard isolation precaution  Rest of the management as per the primary team. Thank you for the consult. Please page with pertinent questions or  concerns.  ______________________________________________________________________ Subjective patient seen and examined at the bedside. No complaints.  No concerns with antibiotics.  Vitals BP (!) 107/59 (BP Location: Right Leg)   Pulse 70   Temp 98 F (36.7 C)   Resp 17   Ht 5' 5 (1.651 m)   Wt 71.8 kg   SpO2 96%   BMI 26.34 kg/m     Physical Exam Constitutional: Sitting in the recliner, comfortable    Comments HEENT WNL  Cardiovascular:     Rate and Rhythm: Normal rate and regular rhythm.     Heart sounds:   Pulmonary:     Effort: Pulmonary effort is normal.     Comments:   Abdominal:     Palpations: Abdomen is soft.     Tenderness: Nondistended and nontender  Musculoskeletal:        General: No swelling or tenderness in peripheral joints.  No signs of septic joint  Skin:    Comments: No rashes  Neurological:     General: Awake, alert and oriented, grossly nonfocal  Psychiatric:        Mood and Affect: Mood normal.   Pertinent Microbiology Results for orders placed or performed during the hospital encounter of 06/06/24  Blood culture (routine x 2)     Status: Abnormal (Preliminary result)   Collection Time: 06/06/24 11:12 AM   Specimen: BLOOD  Result Value Ref Range Status   Specimen Description   Final    BLOOD LEFT ANTECUBITAL Performed at [facility], [address] [place], [place] [address]    Special Requests   Final    BOTTLES DRAWN AEROBIC AND ANAEROBIC Blood Culture results may not be optimal due to an inadequate volume of blood received in culture bottles Performed at [facility], [address] [place], [place]  [address]    Culture  Setup Time   Final    GRAM NEGATIVE RODS IN BOTH AEROBIC AND ANAEROBIC BOTTLES CRITICAL RESULT CALLED TO, READ BACK BY AND VERIFIED WITH: RONAL HENCH PHARMD, AT 9188 06/07/24 D. VANHOOK CRITICAL VALUE NOTED.  VALUE IS CONSISTENT WITH PREVIOUSLY REPORTED AND CALLED  VALUE. Performed at [facility], [address] [place], [place] [address]    Culture PSEUDOMONAS AERUGINOSA (A)  Final   Report Status PENDING  Incomplete  Blood culture (routine x 2)     Status: Abnormal (Preliminary result)   Collection Time: 06/06/24 12:03 PM   Specimen: BLOOD  Result Value Ref Range Status   Specimen Description   Final    BLOOD RIGHT ANTECUBITAL Performed at [facility], [address], [place], [place] [address]    Special Requests   Final    BOTTLES DRAWN AEROBIC AND ANAEROBIC Blood Culture adequate volume Performed at [facility], [address] [place], [place] [address]    Culture  Setup Time   Final    GRAM NEGATIVE RODS IN BOTH AEROBIC AND ANAEROBIC BOTTLES CRITICAL RESULT CALLED TO, READ BACK BY AND VERIFIED WITHBETHA RONAL HENCH PHARMD, AT 9188 06/07/24 D. VANHOOK    Culture (A)  Final    PSEUDOMONAS AERUGINOSA SUSCEPTIBILITIES TO FOLLOW Performed at [facility], [address] [place], [place] [address]    Report Status PENDING  Incomplete  Blood Culture ID Panel (Reflexed)     Status: Abnormal   Collection Time: 06/06/24 12:03 PM  Result Value Ref Range Status   Enterococcus faecalis NOT DETECTED NOT DETECTED Final   Enterococcus Faecium NOT DETECTED NOT DETECTED Final   Listeria monocytogenes NOT DETECTED NOT DETECTED Final   Staphylococcus species NOT DETECTED NOT DETECTED Final   Staphylococcus aureus (BCID) NOT DETECTED NOT DETECTED Final   Staphylococcus epidermidis NOT DETECTED NOT DETECTED Final   Staphylococcus lugdunensis NOT DETECTED NOT DETECTED Final   Streptococcus species NOT DETECTED NOT DETECTED Final   Streptococcus agalactiae NOT DETECTED NOT DETECTED Final   Streptococcus pneumoniae NOT DETECTED NOT DETECTED Final   Streptococcus pyogenes NOT DETECTED NOT DETECTED Final   A.calcoaceticus-baumannii NOT DETECTED NOT DETECTED Final   Bacteroides fragilis NOT DETECTED NOT  DETECTED Final   Enterobacterales NOT DETECTED NOT DETECTED Final   Enterobacter cloacae complex NOT DETECTED NOT DETECTED Final   Escherichia coli NOT DETECTED NOT DETECTED Final   Klebsiella aerogenes NOT DETECTED NOT DETECTED Final   Klebsiella oxytoca NOT DETECTED NOT DETECTED Final   Klebsiella pneumoniae NOT DETECTED NOT DETECTED Final   Proteus species NOT DETECTED NOT DETECTED Final   Salmonella species NOT DETECTED NOT DETECTED Final   Serratia marcescens NOT DETECTED NOT DETECTED Final   Haemophilus influenzae NOT DETECTED NOT DETECTED Final   Neisseria meningitidis NOT DETECTED NOT DETECTED Final   Pseudomonas aeruginosa DETECTED (A) NOT DETECTED Final    Comment: CRITICAL RESULT CALLED TO, READ BACK BY AND VERIFIED WITHBETHA RONAL HENCH PHARMD, AT 9188 06/07/24 D. VANHOOK    Stenotrophomonas maltophilia NOT DETECTED NOT DETECTED Final   Candida albicans NOT DETECTED NOT DETECTED Final   Candida auris NOT DETECTED NOT DETECTED Final   Candida glabrata NOT DETECTED NOT DETECTED Final   Candida krusei NOT DETECTED NOT DETECTED Final   Candida parapsilosis NOT DETECTED NOT DETECTED Final   Candida tropicalis NOT DETECTED NOT DETECTED Final   Cryptococcus neoformans/gattii NOT DETECTED NOT DETECTED Final   CTX-M ESBL NOT DETECTED  NOT DETECTED Final   Carbapenem resistance IMP NOT DETECTED NOT DETECTED Final   Carbapenem resistance KPC NOT DETECTED NOT DETECTED Final   Carbapenem resistance NDM NOT DETECTED NOT DETECTED Final   Carbapenem resistance VIM NOT DETECTED NOT DETECTED Final    Comment: Performed at [facility], [address], [place], [place] [address]    Pertinent Lab.    Latest Ref Rng & Units 06/07/2024    4:19 AM 06/06/2024   11:12 AM 05/29/2024    7:06 AM  CBC  WBC 4.0 - 10.5 K/uL 8.5  17.5  11.0   Hemoglobin 12.0 - 15.0 g/dL 9.0  88.7  9.6   Hematocrit 36.0 - 46.0 % 28.7  33.7  31.5   Platelets 150 - 400 K/uL 199  298  136       Latest Ref Rng & Units  06/07/2024    4:19 AM 06/06/2024   11:12 AM 05/29/2024    7:06 AM  CMP  Glucose 70 - 99 mg/dL 897  807  97   BUN 6 - 20 mg/dL 11  15  12    Creatinine 0.44 - 1.00 mg/dL 9.50  9.19  9.40   Sodium 135 - 145 mmol/L 142  136  141   Potassium 3.5 - 5.1 mmol/L 3.3  3.5  3.7   Chloride 98 - 111 mmol/L 106  96  104   CO2 22 - 32 mmol/L 25  24  28    Calcium 8.9 - 10.3 mg/dL 8.0  9.0  8.1   Total Protein 6.5 - 8.1 g/dL  7.1  5.5   Total Bilirubin 0.0 - 1.2 mg/dL  0.7  0.7   Alkaline Phos 38 - 126 U/L  144  93   AST 15 - 41 U/L  25  17   ALT 0 - 44 U/L  44  29      Pertinent Imaging today Plain films and CT images have been personally visualized and interpreted; radiology reports have been reviewed. Decision making incorporated into the Impression /   No results found.  I spent 50 minutes involved in face-to-face and non-face-to-face activities for this patient on the day of the visit. Professional time spent includes the following activities: Preparing to see the patient (review of tests), Obtaining and reviewing separately obtained history (hospitalist progress note), Performing a medically appropriate examination and evaluation , Ordering medications/labs, referring and communicating with other health care professionals including ID pharmacy, Documenting clinical information in the EMR, Independently interpreting results (not separately reported), Communicating results to the patient, Counseling and educating the patient and Care coordination (not separately reported).   Plan d/w requesting provider as well as ID pharm D  Of note, portions of this note may have been created with voice recognition software. While this note has been edited for accuracy, occasional wrong-word or 'sound-a-like' substitutions may have occurred due to the inherent limitations of voice recognition software.   Electronically signed by:   Annalee Orem, MD Infectious Disease Physician [facility]  for Infectious Disease Pager: [phone]

## 2024-06-09 NOTE — Interval H&P Note (Signed)
 History and Physical Interval Note:  06/09/2024 11:46 AM  Laurie Terry  has presented today for surgery, with the diagnosis of endocarditis.  The various methods of treatment have been discussed with the patient and family. After consideration of risks, benefits and other options for treatment, the patient has consented to  Procedure(s): TRANSESOPHAGEAL ECHOCARDIOGRAM (N/A) as a surgical intervention.  The patient's history has been reviewed, patient examined, no change in status, stable for surgery.  I have reviewed the patient's chart and labs.  Questions were answered to the patient's satisfaction.     Amadi Yoshino

## 2024-06-09 NOTE — Anesthesia Preprocedure Evaluation (Signed)
 Anesthesia Evaluation  Patient identified by MRN, date of birth, ID band Patient awake    Reviewed: Allergy & Precautions, H&P , NPO status , Patient's Chart, lab work & pertinent test results  History of Anesthesia Complications (+) Family history of anesthesia reaction  Airway Mallampati: II  TM Distance: >3 FB Neck ROM: Full    Dental no notable dental hx. (+) Teeth Intact, Dental Advisory Given   Pulmonary neg pulmonary ROS, former smoker   Pulmonary exam normal breath sounds clear to auscultation       Cardiovascular negative cardio ROS  Rhythm:Regular Rate:Normal     Neuro/Psych  Headaches  negative psych ROS   GI/Hepatic Neg liver ROS,GERD  Medicated,,  Endo/Other  negative endocrine ROS    Renal/GU negative Renal ROS  negative genitourinary   Musculoskeletal   Abdominal   Peds  Hematology  (+) Blood dyscrasia, anemia   Anesthesia Other Findings   Reproductive/Obstetrics negative OB ROS                              Anesthesia Physical Anesthesia Plan  ASA: 2  Anesthesia Plan: MAC   Post-op Pain Management: Minimal or no pain anticipated   Induction: Intravenous  PONV Risk Score and Plan: 2 and Propofol  infusion  Airway Management Planned: Natural Airway and Simple Face Mask  Additional Equipment:   Intra-op Plan:   Post-operative Plan:   Informed Consent: I have reviewed the patients History and Physical, chart, labs and discussed the procedure including the risks, benefits and alternatives for the proposed anesthesia with the patient or authorized representative who has indicated his/her understanding and acceptance.     Dental advisory given  Plan Discussed with: CRNA  Anesthesia Plan Comments:         Anesthesia Quick Evaluation

## 2024-06-09 NOTE — Anesthesia Postprocedure Evaluation (Deleted)
 Anesthesia Post Note  Patient: Laurie Terry  Procedure(s) Performed: TRANSESOPHAGEAL ECHOCARDIOGRAM     Patient location during evaluation: PACU Anesthesia Type: General Level of consciousness: awake and alert Pain management: pain level controlled Vital Signs Assessment: post-procedure vital signs reviewed and stable Respiratory status: spontaneous breathing, nonlabored ventilation and respiratory function stable Cardiovascular status: blood pressure returned to baseline and stable Postop Assessment: no apparent nausea or vomiting Anesthetic complications: no   No notable events documented.  Last Vitals:  Vitals:   06/09/24 1230 06/09/24 1236  BP: (!) 107/57   Pulse: 72 66  Resp: 12 13  Temp:  (!) 36.4 C  SpO2: 98% 99%    Last Pain:  Vitals:   06/09/24 1236  TempSrc: Axillary  PainSc:                  Cheyenne Schumm,W. EDMOND

## 2024-06-10 ENCOUNTER — Encounter (HOSPITAL_COMMUNITY): Payer: Self-pay | Admitting: Cardiovascular Disease

## 2024-06-10 DIAGNOSIS — R6520 Severe sepsis without septic shock: Secondary | ICD-10-CM | POA: Diagnosis not present

## 2024-06-10 DIAGNOSIS — A419 Sepsis, unspecified organism: Secondary | ICD-10-CM | POA: Diagnosis not present

## 2024-06-10 LAB — CBC WITH DIFFERENTIAL/PLATELET
Abs Immature Granulocytes: 0.26 K/uL — ABNORMAL HIGH (ref 0.00–0.07)
Basophils Absolute: 0 K/uL (ref 0.0–0.1)
Basophils Relative: 1 %
Eosinophils Absolute: 0.2 K/uL (ref 0.0–0.5)
Eosinophils Relative: 2 %
HCT: 32.6 % — ABNORMAL LOW (ref 36.0–46.0)
Hemoglobin: 10.4 g/dL — ABNORMAL LOW (ref 12.0–15.0)
Immature Granulocytes: 4 %
Lymphocytes Relative: 24 %
Lymphs Abs: 1.8 K/uL (ref 0.7–4.0)
MCH: 29.7 pg (ref 26.0–34.0)
MCHC: 31.9 g/dL (ref 30.0–36.0)
MCV: 93.1 fL (ref 80.0–100.0)
Monocytes Absolute: 0.5 K/uL (ref 0.1–1.0)
Monocytes Relative: 7 %
Neutro Abs: 4.7 K/uL (ref 1.7–7.7)
Neutrophils Relative %: 62 %
Platelets: 203 K/uL (ref 150–400)
RBC: 3.5 MIL/uL — ABNORMAL LOW (ref 3.87–5.11)
RDW: 17.2 % — ABNORMAL HIGH (ref 11.5–15.5)
WBC: 7.5 K/uL (ref 4.0–10.5)
nRBC: 0 % (ref 0.0–0.2)

## 2024-06-10 LAB — BASIC METABOLIC PANEL WITH GFR
Anion gap: 11 (ref 5–15)
BUN: 10 mg/dL (ref 6–20)
CO2: 27 mmol/L (ref 22–32)
Calcium: 8.7 mg/dL — ABNORMAL LOW (ref 8.9–10.3)
Chloride: 104 mmol/L (ref 98–111)
Creatinine, Ser: 0.51 mg/dL (ref 0.44–1.00)
GFR, Estimated: >60 mL/min (ref >60)
Glucose, Bld: 73 mg/dL (ref 70–99)
Potassium: 3.6 mmol/L (ref 3.5–5.1)
Sodium: 142 mmol/L (ref 135–145)

## 2024-06-10 MED ORDER — LINEZOLID 600 MG/300ML IV SOLN
600.0000 mg | Freq: Two times a day (BID) | INTRAVENOUS | Status: DC
  Administered 2024-06-10 (×2): 600 mg via INTRAVENOUS
  Filled 2024-06-10 (×3): qty 300

## 2024-06-10 NOTE — Progress Notes (Signed)
 Terry NOTE  EVALIN Laurie FMW:[id number] DOB: 1966/02/24 DOA: 06/06/2024 PCP: Laurie Harvey, MD   LOS: 4 days   Brief narrative:  Patient is a 58 years old female with past medical history of giant cell arteritis and PMR on prednisone , recent Klebsiella bacteremia and Enterococcus liver abscess, presented to the hospital on 06/06/2024 with lightheadedness and low blood pressure.  Of note patient was admitted between 829 25-8 9 and 725 for acute thrombocytopenia which was felt secondary to infection and received 2 days of IVIG.  Further workup revealed that patient had Klebsiella bacteremia and liver abscess which grew Enterococcus faecalis.  ID was consulted and was initially treated with daptomycin  Rocephin  and Flagyl  and was discharged home on amoxicillin  and cefadroxil  for total of 4-week course.  On 06/02/2024 patient underwent EUS for evaluation of pneumobilia without any gallstones.  Subsequently patient started feeling lightheaded with some shortness of breath intermittent dry cough and went to GI clinic.  In the clinic patient was noted to be hypotensive and was sent to the ED for further evaluation and treatment.  In the ED patient had mild tachycardia, tachypnea and border pressure was 100/67.  Labs show WBC at 17.5 potassium low at 3.5.  Lactate was initially elevated to 3.8.  CT scan of the abdomen and pelvis showed several scattered lung nodules favoring multifocal cavitary pneumonia with scattered hypodense hepatic lesions.  Patient was then considered for admission to the hospital for further evaluation and treatment.    Assessment/Plan: Principal Problem:   Severe sepsis (HCC) Active Problems:   Bacteremia due to Pseudomonas   Normocytic anemia   PMR (polymyalgia rheumatica) (HCC)   Liver abscess   Temporal giant cell arteritis (HCC)   Acute bacterial endocarditis   Severe sepsis secondary to Pseudomonas bacteremia Septic emboli/multifocal cavitary pneumonia Ongoing  hepatic abscesses Recent Klebsiella bacteremia/Enterococcus liver abscess  CT chest/abd/pelvis changes suspicious for pulmonary septic emboli as well as persistent hepatic lesions concerning for abscesses.  On antibiotics as per ID.  Patient underwent TEE on 06/09/2024 without any vegetation but small PFO.  Currently on daptomycin  and meropenem .  Repeat blood cultures negative in less than 24 hours.Follow ID recommendations.    Allergic reaction to cefepime  Antibiotics has been changed.   Giant cell arteritis/PMR Diagnosed by rheumatologist [name] with elevated inflammatory markers.  Patient was on prednisone  50 mg and atovaquone .  Previous provider had spoken with  [name] and, risk/benefit of ongoing prednisone  at high dose discussed, decision making conducted with patient, all concur that decrease prednisone  is in the patient's best interest.  [name] recommended dropping dose to 15 mg daily (PMR dose)   Normocytic anemia Continue to monitor CBC.  DVT prophylaxis: enoxaparin  (LOVENOX ) injection 40 mg Start: 06/07/24 1000   Disposition: Home likely  Status is: Inpatient Remains inpatient appropriate because: IV antibiotics, pending clinical improvement    Code Status:     Code Status: Full Code  Family Communication: None at bedside  Consultants: Infectious disease Cardiology  Procedures: TEE  Anti-infectives:  Daptomycin  and meropenem   Anti-infectives (From admission, onward)    Start     Dose/Rate Route Frequency Ordered Stop   06/08/24 1400  DAPTOmycin  (CUBICIN ) IVPB 700 mg/128mL premix        700 mg 200 mL/hr over 30 Minutes Intravenous Daily 06/08/24 0747     06/07/24 2000  meropenem  (MERREM ) 1 g in sodium chloride  0.9 % 100 mL IVPB        1 g 200 mL/hr  over 30 Minutes Intravenous Every 8 hours 06/07/24 1132     06/07/24 1400  DAPTOmycin  (CUBICIN ) IVPB 500 mg/50mL premix  Status:  Discontinued        500 mg 100 mL/hr over 30 Minutes Intravenous Daily  06/06/24 2057 06/07/24 0832   06/07/24 1400  DAPTOmycin  (CUBICIN ) IVPB 700 mg/100mL premix  Status:  Discontinued        700 mg 200 mL/hr over 30 Minutes Intravenous Daily 06/07/24 0832 06/07/24 1015   06/07/24 1400  DAPTOmycin  (CUBICIN ) IVPB 700 mg/158mL premix        700 mg 200 mL/hr over 30 Minutes Intravenous Once 06/07/24 1130 06/07/24 1815   06/07/24 1115  piperacillin -tazobactam (ZOSYN ) IVPB 3.375 g  Status:  Discontinued        3.375 g 12.5 mL/hr over 240 Minutes Intravenous Every 8 hours 06/07/24 1015 06/07/24 1132   06/07/24 1000  cefTRIAXone  (ROCEPHIN ) 2 g in sodium chloride  0.9 % 100 mL IVPB  Status:  Discontinued        2 g 200 mL/hr over 30 Minutes Intravenous Every 24 hours 06/06/24 2042 06/07/24 0832   06/07/24 0930  ceFEPIme  (MAXIPIME ) 2 g in sodium chloride  0.9 % 100 mL IVPB  Status:  Discontinued        2 g 200 mL/hr over 30 Minutes Intravenous Every 8 hours 06/07/24 0831 06/07/24 1015   06/07/24 0915  piperacillin -tazobactam (ZOSYN ) IVPB 3.375 g  Status:  Discontinued        3.375 g 12.5 mL/hr over 240 Minutes Intravenous Every 8 hours 06/07/24 0829 06/07/24 0831   06/06/24 2300  atovaquone  (MEPRON ) 750 MG/5ML suspension 750 mg        750 mg Oral 2 times daily 06/06/24 2113     06/06/24 2200  DAPTOmycin  (CUBICIN ) IVPB 700 mg/100mL premix        700 mg 200 mL/hr over 30 Minutes Intravenous Once 06/06/24 2112 06/07/24 0036   06/06/24 1315  DAPTOmycin  (CUBICIN ) IVPB 700 mg/100mL premix  Status:  Discontinued        700 mg 200 mL/hr over 30 Minutes Intravenous Once 06/06/24 1303 06/06/24 2112   06/06/24 1315  fidaxomicin  (DIFICID ) tablet 200 mg  Status:  Discontinued        200 mg Oral 2 times daily 06/06/24 1307 06/06/24 1312   06/06/24 1315  fidaxomicin  (DIFICID ) tablet 200 mg  Status:  Discontinued        200 mg Oral 2 times daily 06/06/24 1307 06/06/24 2107   06/06/24 1300  cefTRIAXone  (ROCEPHIN ) 2 g in sodium chloride  0.9 % 100 mL IVPB        2 g 200 mL/hr over  30 Minutes Intravenous  Once 06/06/24 1252 06/06/24 1408        Subjective: Today, patient was seen and examined at bedside.  Patient states that she feels better today no nausea vomiting fever chills or shortness of breath.  Mild right upper quadrant discomfort only on pressing the abdomen.  Has some loose stools.  Objective: Vitals:   06/09/24 2017 06/10/24 0510  BP: 95/64 118/80  Pulse: 85 70  Resp: 16 18  Temp: 98.4 F (36.9 C) 98.3 F (36.8 C)  SpO2: 97% 96%    Intake/Output Summary (Last 24 hours) at 06/10/2024 1042 Last data filed at 06/10/2024 0830 Gross per 24 hour  Intake 273 ml  Output --  Net 273 ml   Filed Weights   06/06/24 1825  Weight: 71.8 kg   Body mass index  is 26.34 kg/m.   Physical Exam: GENERAL: Patient is alert awake and oriented. Not in obvious distress. HENT: No scleral pallor or icterus. Pupils equally reactive to light. Oral mucosa is moist NECK: is supple, no gross swelling noted. CHEST: Clear to auscultation. No crackles or wheezes.  Diminished breath sounds bilaterally. CVS: S1 and S2 heard, no murmur. Regular rate and rhythm.  ABDOMEN: Soft, mild tenderness on palpation of the right upper quadrant, bowel sounds are present. EXTREMITIES: No edema. CNS: Cranial nerves are intact. No focal motor deficits. SKIN: warm and dry without rashes.  Data Review: I have personally reviewed the following laboratory data and studies,  CBC: Recent Labs  Lab 06/06/24 1112 06/07/24 0419 06/10/24 0504  WBC 17.5* 8.5 7.5  NEUTROABS  --   --  4.7  HGB 11.2* 9.0* 10.4*  HCT 33.7* 28.7* 32.6*  MCV 90.3 94.7 93.1  PLT 298 199 203   Basic Metabolic Panel: Recent Labs  Lab 06/06/24 1112 06/07/24 0419 06/10/24 0504  NA 136 142 142  K 3.5 3.3* 3.6  CL 96* 106 104  CO2 24 25 27   GLUCOSE 192* 102* 73  BUN 15 11 10   CREATININE 0.80 0.49 0.51  CALCIUM 9.0 8.0* 8.7*   Liver Function Tests: Recent Labs  Lab 06/06/24 1112  AST 25  ALT 44   ALKPHOS 144*  BILITOT 0.7  PROT 7.1  ALBUMIN 3.3*   Recent Labs  Lab 06/06/24 1112  LIPASE 28   No results for input(s): AMMONIA in the last 168 hours. Cardiac Enzymes: Recent Labs  Lab 06/07/24 0419  CKTOTAL 26*   BNP (last 3 results) No results for input(s): BNP in the last 8760 hours.  ProBNP (last 3 results) No results for input(s): PROBNP in the last 8760 hours.  CBG: No results for input(s): GLUCAP in the last 168 hours. Recent Results (from the past 240 hours)  Blood culture (routine x 2)     Status: Abnormal   Collection Time: 06/06/24 11:12 AM   Specimen: BLOOD  Result Value Ref Range Status   Specimen Description   Final    BLOOD LEFT ANTECUBITAL Performed at [facility], [address], [place], [place] [address]    Special Requests   Final    BOTTLES DRAWN AEROBIC AND ANAEROBIC Blood Culture results may not be optimal due to an inadequate volume of blood received in culture bottles Performed at [facility], [address], [place], [place] [address]    Culture  Setup Time   Final    GRAM NEGATIVE RODS IN BOTH AEROBIC AND ANAEROBIC BOTTLES CRITICAL RESULT CALLED TO, READ BACK BY AND VERIFIED WITHBETHA RONAL HENCH PHARMD, AT 9188 06/07/24 D. VANHOOK CRITICAL VALUE NOTED.  VALUE IS CONSISTENT WITH PREVIOUSLY REPORTED AND CALLED VALUE.    Culture (A)  Final    PSEUDOMONAS AERUGINOSA SUSCEPTIBILITIES PERFORMED ON PREVIOUS CULTURE WITHIN THE LAST 5 DAYS. Performed at [facility], [address] [place], [place] [address]    Report Status 06/09/2024 FINAL  Final  Blood culture (routine x 2)     Status: Abnormal   Collection Time: 06/06/24 12:03 PM   Specimen: BLOOD  Result Value Ref Range Status   Specimen Description   Final    BLOOD RIGHT ANTECUBITAL Performed at [facility], [address], [place], [place] [address]    Special Requests   Final    BOTTLES DRAWN AEROBIC AND  ANAEROBIC Blood Culture adequate volume Performed at  [facility], [address], [place], [place] [address]    Culture  Setup Time   Final    GRAM NEGATIVE RODS IN BOTH AEROBIC AND ANAEROBIC BOTTLES CRITICAL RESULT CALLED TO, READ BACK BY AND VERIFIED WITHBETHA RONAL HENCH PHARMD, AT 9188 06/07/24 D. VANHOOK Performed at [facility], [address] [place], [place] [address]    Culture PSEUDOMONAS AERUGINOSA (A)  Final   Report Status 06/09/2024 FINAL  Final   Organism ID, Bacteria PSEUDOMONAS AERUGINOSA  Final      Susceptibility   Pseudomonas aeruginosa - MIC*    MEROPENEM  1 SENSITIVE Sensitive     CIPROFLOXACIN 0.5 SENSITIVE Sensitive     IMIPENEM  2 SENSITIVE Sensitive     CEFEPIME  2 SENSITIVE Sensitive     CEFTAZIDIME/AVIBACTAM 2 SENSITIVE Sensitive ug/mL    CEFTOLOZANE/TAZOBACTAM 0.5 SENSITIVE Sensitive ug/mL    TOBRAMYCIN <=1 SENSITIVE Sensitive     CEFTAZIDIME 4 SENSITIVE Sensitive     * PSEUDOMONAS AERUGINOSA  Blood Culture ID Panel (Reflexed)     Status: Abnormal   Collection Time: 06/06/24 12:03 PM  Result Value Ref Range Status   Enterococcus faecalis NOT DETECTED NOT DETECTED Final   Enterococcus Faecium NOT DETECTED NOT DETECTED Final   Listeria monocytogenes NOT DETECTED NOT DETECTED Final   Staphylococcus species NOT DETECTED NOT DETECTED Final   Staphylococcus aureus (BCID) NOT DETECTED NOT DETECTED Final   Staphylococcus epidermidis NOT DETECTED NOT DETECTED Final   Staphylococcus lugdunensis NOT DETECTED NOT DETECTED Final   Streptococcus species NOT DETECTED NOT DETECTED Final   Streptococcus agalactiae NOT DETECTED NOT DETECTED Final   Streptococcus pneumoniae NOT DETECTED NOT DETECTED Final   Streptococcus pyogenes NOT DETECTED NOT DETECTED Final   A.calcoaceticus-baumannii NOT DETECTED NOT DETECTED Final   Bacteroides fragilis NOT DETECTED NOT DETECTED Final   Enterobacterales NOT DETECTED NOT DETECTED Final   Enterobacter cloacae  complex NOT DETECTED NOT DETECTED Final   Escherichia coli NOT DETECTED NOT DETECTED Final   Klebsiella aerogenes NOT DETECTED NOT DETECTED Final   Klebsiella oxytoca NOT DETECTED NOT DETECTED Final   Klebsiella pneumoniae NOT DETECTED NOT DETECTED Final   Proteus species NOT DETECTED NOT DETECTED Final   Salmonella species NOT DETECTED NOT DETECTED Final   Serratia marcescens NOT DETECTED NOT DETECTED Final   Haemophilus influenzae NOT DETECTED NOT DETECTED Final   Neisseria meningitidis NOT DETECTED NOT DETECTED Final   Pseudomonas aeruginosa DETECTED (A) NOT DETECTED Final    Comment: CRITICAL RESULT CALLED TO, READ BACK BY AND VERIFIED WITHBETHA RONAL HENCH PHARMD, AT 9188 06/07/24 D. VANHOOK    Stenotrophomonas maltophilia NOT DETECTED NOT DETECTED Final   Candida albicans NOT DETECTED NOT DETECTED Final   Candida auris NOT DETECTED NOT DETECTED Final   Candida glabrata NOT DETECTED NOT DETECTED Final   Candida krusei NOT DETECTED NOT DETECTED Final   Candida parapsilosis NOT DETECTED NOT DETECTED Final   Candida tropicalis NOT DETECTED NOT DETECTED Final   Cryptococcus neoformans/gattii NOT DETECTED NOT DETECTED Final   CTX-M ESBL NOT DETECTED NOT DETECTED Final   Carbapenem resistance IMP NOT DETECTED NOT DETECTED Final   Carbapenem resistance KPC NOT DETECTED NOT DETECTED Final   Carbapenem resistance NDM NOT DETECTED NOT DETECTED Final   Carbapenem resistance VIM NOT DETECTED NOT DETECTED Final    Comment: Performed at [facility], [address], [place], [place] [address]  Culture, blood (Routine X 2) w Reflex to ID Panel     Status: None (  Preliminary result)   Collection Time: 06/09/24  5:02 AM   Specimen: BLOOD LEFT HAND  Result Value Ref Range Status   Specimen Description   Final    BLOOD LEFT HAND Performed at [facility], [address] [place], [place] [address]    Special Requests   Final    Blood Culture adequate volume BOTTLES DRAWN AEROBIC AND  ANAEROBIC Performed at [facility], [address] [place], [place] [address]    Culture   Final    NO GROWTH < 24 HOURS Performed at [facility], [address], [place], [place] [address]    Report Status PENDING  Incomplete  Culture, blood (Routine X 2) w Reflex to ID Panel     Status: None (Preliminary result)   Collection Time: 06/09/24  5:09 AM   Specimen: BLOOD LEFT HAND  Result Value Ref Range Status   Specimen Description   Final    BLOOD LEFT HAND Performed at [facility], [address] [place], [place] [address]    Special Requests   Final    Blood Culture results may not be optimal due to an inadequate volume of blood received in culture bottles BOTTLES DRAWN AEROBIC AND ANAEROBIC Performed at [facility], [address], [place], [place] [address]    Culture   Final    NO GROWTH < 24 HOURS Performed at [facility], [address] [place], [place] [address]    Report Status PENDING  Incomplete     Studies: ECHO TEE Result Date: 06/09/2024    TRANSESOPHOGEAL ECHO REPORT   Patient Name:   Laurie Terry [facility] Date of Exam: 06/09/2024 Medical Rec #:  [id number]           Height:       65.0 in Accession #:    [id number]          Weight:       158.3 lb Date of Birth:  1966/06/21           BSA:          1.791 m Patient Age:    58 years            BP:           108/73 mmHg Patient Gender: F                   HR:           62 bpm. Exam Location:  Inpatient Procedure: Transesophageal Echo, Cardiac Doppler and Color Doppler (Both            Spectral and Color Flow Doppler were utilized during procedure). Indications:     Endocarditis  History:         Patient has prior history of Echocardiogram examinations, most                  recent 05/25/2024. Arrythmias:PAC, Tachycardia and Bradycardia;                  Risk Factors:Dyslipidemia.  Sonographer:     Thea Norlander RCS Referring Phys:  [id number] WADDELL A PARCELLS Diagnosing  Phys: Jerel Balding MD PROCEDURE: After discussion of the risks and benefits of a TEE, an informed consent was obtained from the patient. The transesophogeal probe was passed without difficulty through the esophogus of the patient. Imaged were obtained with the patient in a left lateral decubitus position.  Sedation performed by different physician. The patient was monitored while under deep sedation. Anesthestetic sedation was provided intravenously by Anesthesiology: 207.96mg  of Propofol , 100mg  of Lidocaine . The patient developed no complications during the procedure.  IMPRESSIONS  1. Left ventricular ejection fraction, by estimation, is 60 to 65%. The left ventricle has normal function.  2. Right ventricular systolic function is normal. The right ventricular size is normal.  3. No left atrial/left atrial appendage thrombus was detected.  4. The mitral valve is normal in structure. Mild mitral valve regurgitation.  5. The aortic valve is normal in structure. Aortic valve regurgitation is trivial.  6. Aortic dilatation noted. There is mild dilatation of the aortic root, measuring 42 mm. There is borderline dilatation of the ascending aorta, measuring 40 mm.  7. Evidence of atrial level shunting detected by color flow Doppler. There is a small patent foramen ovale with predominantly left to right shunting across the atrial septum. FINDINGS  Left Ventricle: Left ventricular ejection fraction, by estimation, is 60 to 65%. The left ventricle has normal function. The left ventricular internal cavity size was normal in size. Right Ventricle: The right ventricular size is normal. No increase in right ventricular wall thickness. Right ventricular systolic function is normal. Left Atrium: Left atrial size was normal in size. No left atrial/left atrial appendage thrombus was detected. Right Atrium: Right atrial size was normal in size. Pericardium: There is no evidence of pericardial effusion. Mitral Valve: The mitral valve  is normal in structure. Mild mitral valve regurgitation. Tricuspid Valve: The tricuspid valve is normal in structure. Tricuspid valve regurgitation is not demonstrated. Aortic Valve: The aortic valve is normal in structure. Aortic valve regurgitation is trivial. Pulmonic Valve: The pulmonic valve was grossly normal. Pulmonic valve regurgitation is not visualized. Aorta: Aortic dilatation noted. There is mild dilatation of the aortic root, measuring 42 mm. There is borderline dilatation of the ascending aorta, measuring 40 mm. There is minimal (Grade I) plaque. IAS/Shunts: Evidence of atrial level shunting detected by color flow Doppler. A small patent foramen ovale is detected with predominantly left to right shunting across the atrial septum. Additional Comments: Spectral Doppler performed. LEFT VENTRICLE PLAX 2D LVOT diam:     2.00 cm LVOT Area:     3.14 cm   AORTA Ao Root diam: 4.20 cm Ao Asc diam:  4.00 cm  SHUNTS Systemic Diam: 2.00 cm Jerel Balding MD Electronically signed by Jerel Balding MD Signature Date/Time: 06/09/2024/6:01:22 PM    Final    EP STUDY Result Date: 06/09/2024 See surgical note for result.     Zahava Quant, MD  [facility] 06/10/2024  If 7PM-7AM, please contact night-coverage

## 2024-06-11 DIAGNOSIS — M315 Giant cell arteritis with polymyalgia rheumatica: Secondary | ICD-10-CM | POA: Diagnosis not present

## 2024-06-11 DIAGNOSIS — B965 Pseudomonas (aeruginosa) (mallei) (pseudomallei) as the cause of diseases classified elsewhere: Secondary | ICD-10-CM | POA: Diagnosis not present

## 2024-06-11 DIAGNOSIS — R7881 Bacteremia: Secondary | ICD-10-CM | POA: Diagnosis not present

## 2024-06-11 DIAGNOSIS — K750 Abscess of liver: Secondary | ICD-10-CM | POA: Diagnosis not present

## 2024-06-11 DIAGNOSIS — R6520 Severe sepsis without septic shock: Secondary | ICD-10-CM | POA: Diagnosis not present

## 2024-06-11 DIAGNOSIS — A419 Sepsis, unspecified organism: Secondary | ICD-10-CM | POA: Diagnosis not present

## 2024-06-11 LAB — CBC WITH DIFFERENTIAL/PLATELET
Abs Immature Granulocytes: 0.4 K/uL — ABNORMAL HIGH (ref 0.00–0.07)
Basophils Absolute: 0.1 K/uL (ref 0.0–0.1)
Basophils Relative: 1 %
Eosinophils Absolute: 0.2 K/uL (ref 0.0–0.5)
Eosinophils Relative: 2 %
HCT: 34.3 % — ABNORMAL LOW (ref 36.0–46.0)
Hemoglobin: 10.4 g/dL — ABNORMAL LOW (ref 12.0–15.0)
Immature Granulocytes: 5 %
Lymphocytes Relative: 25 %
Lymphs Abs: 2.2 K/uL (ref 0.7–4.0)
MCH: 28.6 pg (ref 26.0–34.0)
MCHC: 30.3 g/dL (ref 30.0–36.0)
MCV: 94.2 fL (ref 80.0–100.0)
Monocytes Absolute: 0.7 K/uL (ref 0.1–1.0)
Monocytes Relative: 8 %
Neutro Abs: 5.2 K/uL (ref 1.7–7.7)
Neutrophils Relative %: 59 %
Platelets: 227 K/uL (ref 150–400)
RBC: 3.64 MIL/uL — ABNORMAL LOW (ref 3.87–5.11)
RDW: 17.5 % — ABNORMAL HIGH (ref 11.5–15.5)
WBC: 8.8 K/uL (ref 4.0–10.5)
nRBC: 0.2 % (ref 0.0–0.2)

## 2024-06-11 LAB — BASIC METABOLIC PANEL WITH GFR
Anion gap: 12 (ref 5–15)
BUN: 12 mg/dL (ref 6–20)
CO2: 25 mmol/L (ref 22–32)
Calcium: 8.2 mg/dL — ABNORMAL LOW (ref 8.9–10.3)
Chloride: 104 mmol/L (ref 98–111)
Creatinine, Ser: 0.55 mg/dL (ref 0.44–1.00)
GFR, Estimated: >60 mL/min (ref >60)
Glucose, Bld: 67 mg/dL — ABNORMAL LOW (ref 70–99)
Potassium: 3.7 mmol/L (ref 3.5–5.1)
Sodium: 140 mmol/L (ref 135–145)

## 2024-06-11 MED ORDER — SODIUM CHLORIDE 0.9 % IV SOLN
2.0000 g | INTRAVENOUS | Status: DC
  Administered 2024-06-11 – 2024-06-14 (×19): 2 g via INTRAVENOUS
  Filled 2024-06-11 (×21): qty 2000

## 2024-06-11 MED ORDER — ALUM & MAG HYDROXIDE-SIMETH 200-200-20 MG/5ML PO SUSP
30.0000 mL | Freq: Four times a day (QID) | ORAL | Status: DC | PRN
  Administered 2024-06-11: 30 mL via ORAL
  Filled 2024-06-11: qty 30

## 2024-06-11 NOTE — Progress Notes (Addendum)
 PHARMACY CONSULT NOTE FOR:  OUTPATIENT  PARENTERAL ANTIBIOTIC THERAPY (OPAT)  Indication: polymicrobial infection - liver abscess, pulm cavitations Regimen: Meropenem  IVBP 1g every 8 hours and Ampicillin  12g daily as a continuous infusion End date: 07/21/2024  IV antibiotic discharge orders are pended. To discharging provider:  please sign these orders via discharge navigator,  Select New Orders & click on the button choice - Manage This Unsigned Work.     Thank you for allowing pharmacy to be a part of this patient's care.  Dionicia Canavan, PharmD, RPh PGY1 Acute Care Pharmacy Resident [facility]  06/11/2024 10:38 AM

## 2024-06-11 NOTE — Progress Notes (Signed)
 PROGRESS NOTE  Laurie Terry FMW:[id number] DOB: 03/25/1966 DOA: 06/06/2024 PCP: [name], MD   LOS: 5 days   Brief narrative:  Patient is a 58 years old female with past medical history of giant cell arteritis and PMR on prednisone , recent Klebsiella bacteremia and Enterococcus liver abscess, presented to the hospital on 06/06/2024 with lightheadedness and low blood pressure.  Of note patient was admitted between 829 25-8 9 and 725 for acute thrombocytopenia which was felt secondary to infection and received 2 days of IVIG.  Further workup revealed that patient had Klebsiella bacteremia and liver abscess which grew Enterococcus faecalis.  ID was consulted and was initially treated with daptomycin  Rocephin  and Flagyl  and was discharged home on amoxicillin  and cefadroxil  for total of 4-week course.  On 06/02/2024 patient underwent EUS for evaluation of pneumobilia without any gallstones.  Subsequently patient started feeling lightheaded with some shortness of breath intermittent dry cough and went to GI clinic.  In the clinic patient was noted to be hypotensive and was sent to the ED for further evaluation and treatment.  In the ED patient had mild tachycardia, tachypnea and border pressure was 100/67.  Labs show WBC at 17.5 potassium low at 3.5.  Lactate was initially elevated to 3.8.  CT scan of the abdomen and pelvis showed several scattered lung nodules favoring multifocal cavitary pneumonia with scattered hypodense hepatic lesions.  Patient was then considered for admission to the hospital for further evaluation and treatment.    Assessment/Plan: Principal Problem:   Severe sepsis (HCC) Active Problems:   Bacteremia due to Pseudomonas   Normocytic anemia   PMR (polymyalgia rheumatica) (HCC)   Liver abscess   Temporal giant cell arteritis (HCC)   Acute bacterial endocarditis   Severe sepsis secondary to Pseudomonas bacteremia Septic emboli/multifocal cavitary pneumonia Ongoing  hepatic abscesses Recent Klebsiella bacteremia/Enterococcus liver abscess CT chest/abd/pelvis changes suspicious for pulmonary septic emboli as well as persistent hepatic lesions concerning for abscesses.   Patient underwent TEE on 06/09/2024 without any vegetation but small PFO.  Currently on ampicillin , linezolid  meropenem .  Repeat blood cultures negative in 2 days.  Folow ID recommendations.    Mild epigastric tenderness.  History of gastritis.  Unable to tolerate PPI.  Will add Mylanta today.  Allergic reaction to cefepime  Antibiotics has been changed.   Giant cell arteritis/PMR Diagnosed by rheumatologist [name] with elevated inflammatory markers.  Patient was on prednisone  50 mg and atovaquone .  Previous provider had spoken with  [name] and, risk/benefit of ongoing prednisone  at high dose discussed, decision making conducted with patient, all concur that decrease prednisone  is in the patient's best interest.  [name] recommended dropping dose to 15 mg daily (PMR dose)   Normocytic anemia Latest hemoglobin of 10.4.  DVT prophylaxis: enoxaparin  (LOVENOX ) injection 40 mg Start: 06/07/24 1000   Disposition: Home likely in 1 to 2 days,  Status is: Inpatient Remains inpatient appropriate because: IV antibiotics, pending clinical improvement, ID input    Code Status:     Code Status: Full Code  Family Communication: None at bedside  Consultants: Infectious disease Cardiology  Procedures: TEE  Anti-infectives:  Ampicillin , linezolid  meropenem   Anti-infectives (From admission, onward)    Start     Dose/Rate Route Frequency Ordered Stop   06/11/24 1200  ampicillin  (OMNIPEN) 2 g in sodium chloride  0.9 % 100 mL IVPB        2 g 300 mL/hr over 20 Minutes Intravenous Every 4 hours 06/11/24 0956  06/10/24 1345  linezolid  (ZYVOX ) IVPB 600 mg  Status:  Discontinued        600 mg 300 mL/hr over 60 Minutes Intravenous Every 12 hours 06/10/24 1259 06/11/24 0956    06/08/24 1400  DAPTOmycin  (CUBICIN ) IVPB 700 mg/100mL premix  Status:  Discontinued        700 mg 200 mL/hr over 30 Minutes Intravenous Daily 06/08/24 0747 06/10/24 1259   06/07/24 2000  meropenem  (MERREM ) 1 g in sodium chloride  0.9 % 100 mL IVPB        1 g 200 mL/hr over 30 Minutes Intravenous Every 8 hours 06/07/24 1132     06/07/24 1400  DAPTOmycin  (CUBICIN ) IVPB 500 mg/69mL premix  Status:  Discontinued        500 mg 100 mL/hr over 30 Minutes Intravenous Daily 06/06/24 2057 06/07/24 0832   06/07/24 1400  DAPTOmycin  (CUBICIN ) IVPB 700 mg/137mL premix  Status:  Discontinued        700 mg 200 mL/hr over 30 Minutes Intravenous Daily 06/07/24 0832 06/07/24 1015   06/07/24 1400  DAPTOmycin  (CUBICIN ) IVPB 700 mg/136mL premix        700 mg 200 mL/hr over 30 Minutes Intravenous Once 06/07/24 1130 06/07/24 1815   06/07/24 1115  piperacillin -tazobactam (ZOSYN ) IVPB 3.375 g  Status:  Discontinued        3.375 g 12.5 mL/hr over 240 Minutes Intravenous Every 8 hours 06/07/24 1015 06/07/24 1132   06/07/24 1000  cefTRIAXone  (ROCEPHIN ) 2 g in sodium chloride  0.9 % 100 mL IVPB  Status:  Discontinued        2 g 200 mL/hr over 30 Minutes Intravenous Every 24 hours 06/06/24 2042 06/07/24 0832   06/07/24 0930  ceFEPIme  (MAXIPIME ) 2 g in sodium chloride  0.9 % 100 mL IVPB  Status:  Discontinued        2 g 200 mL/hr over 30 Minutes Intravenous Every 8 hours 06/07/24 0831 06/07/24 1015   06/07/24 0915  piperacillin -tazobactam (ZOSYN ) IVPB 3.375 g  Status:  Discontinued        3.375 g 12.5 mL/hr over 240 Minutes Intravenous Every 8 hours 06/07/24 0829 06/07/24 0831   06/06/24 2300  atovaquone  (MEPRON ) 750 MG/5ML suspension 750 mg        750 mg Oral 2 times daily 06/06/24 2113     06/06/24 2200  DAPTOmycin  (CUBICIN ) IVPB 700 mg/100mL premix        700 mg 200 mL/hr over 30 Minutes Intravenous Once 06/06/24 2112 06/07/24 0036   06/06/24 1315  DAPTOmycin  (CUBICIN ) IVPB 700 mg/100mL premix  Status:  Discontinued         700 mg 200 mL/hr over 30 Minutes Intravenous Once 06/06/24 1303 06/06/24 2112   06/06/24 1315  fidaxomicin  (DIFICID ) tablet 200 mg  Status:  Discontinued        200 mg Oral 2 times daily 06/06/24 1307 06/06/24 1312   06/06/24 1315  fidaxomicin  (DIFICID ) tablet 200 mg  Status:  Discontinued        200 mg Oral 2 times daily 06/06/24 1307 06/06/24 2107   06/06/24 1300  cefTRIAXone  (ROCEPHIN ) 2 g in sodium chloride  0.9 % 100 mL IVPB        2 g 200 mL/hr over 30 Minutes Intravenous  Once 06/06/24 1252 06/06/24 1408       Subjective: Today, patient was seen and examined at bedside.  Patient complains of mild epigastric discomfort like heartburn.  Inquiring about Mylanta.  Denies any nausea vomiting fever chills or  rigor.   Objective: Vitals:   06/11/24 0503 06/11/24 0858  BP: 112/63 111/72  Pulse: 75 (!) 105  Resp: 16 18  Temp: 98.6 F (37 C) 98.3 F (36.8 C)  SpO2: 97% 98%    Intake/Output Summary (Last 24 hours) at 06/11/2024 1114 Last data filed at 06/11/2024 0606 Gross per 24 hour  Intake 1537.72 ml  Output --  Net 1537.72 ml   Filed Weights   06/06/24 1825  Weight: 71.8 kg   Body mass index is 26.34 kg/m.   Physical Exam: GENERAL: Patient is alert awake and oriented. Not in obvious distress. HENT: No scleral pallor or icterus. Pupils equally reactive to light. Oral mucosa is moist NECK: is supple, no gross swelling noted. CHEST: Clear to auscultation. No crackles or wheezes.  CVS: S1 and S2 heard, no murmur. Regular rate and rhythm.  ABDOMEN: Soft, mild tenderness over the epigastric region., bowel sounds are present. EXTREMITIES: No edema. CNS: Cranial nerves are intact. No focal motor deficits. SKIN: warm and dry without rashes.  Data Review: I have personally reviewed the following laboratory data and studies,  CBC: Recent Labs  Lab 06/06/24 1112 06/07/24 0419 06/10/24 0504 06/11/24 0444  WBC 17.5* 8.5 7.5 8.8  NEUTROABS  --   --  4.7 5.2  HGB  11.2* 9.0* 10.4* 10.4*  HCT 33.7* 28.7* 32.6* 34.3*  MCV 90.3 94.7 93.1 94.2  PLT 298 199 203 227   Basic Metabolic Panel: Recent Labs  Lab 06/06/24 1112 06/07/24 0419 06/10/24 0504 06/11/24 0444  NA 136 142 142 140  K 3.5 3.3* 3.6 3.7  CL 96* 106 104 104  CO2 24 25 27 25   GLUCOSE 192* 102* 73 67*  BUN 15 11 10 12   CREATININE 0.80 0.49 0.51 0.55  CALCIUM 9.0 8.0* 8.7* 8.2*   Liver Function Tests: Recent Labs  Lab 06/06/24 1112  AST 25  ALT 44  ALKPHOS 144*  BILITOT 0.7  PROT 7.1  ALBUMIN 3.3*   Recent Labs  Lab 06/06/24 1112  LIPASE 28   No results for input(s): AMMONIA in the last 168 hours. Cardiac Enzymes: Recent Labs  Lab 06/07/24 0419  CKTOTAL 26*   BNP (last 3 results) No results for input(s): BNP in the last 8760 hours.  ProBNP (last 3 results) No results for input(s): PROBNP in the last 8760 hours.  CBG: No results for input(s): GLUCAP in the last 168 hours. Recent Results (from the past 240 hours)  Blood culture (routine x 2)     Status: Abnormal   Collection Time: 06/06/24 11:12 AM   Specimen: BLOOD  Result Value Ref Range Status   Specimen Description   Final    BLOOD LEFT ANTECUBITAL Performed at [facility], [address] [place], [place] [address]    Special Requests   Final    BOTTLES DRAWN AEROBIC AND ANAEROBIC Blood Culture results may not be optimal due to an inadequate volume of blood received in culture bottles Performed at [facility], [address] [place], [place] [address]    Culture  Setup Time   Final    GRAM NEGATIVE RODS IN BOTH AEROBIC AND ANAEROBIC BOTTLES CRITICAL RESULT CALLED TO, READ BACK BY AND VERIFIED WITHBETHA RONAL HENCH PHARMD, AT 9188 06/07/24 D. VANHOOK CRITICAL VALUE NOTED.  VALUE IS CONSISTENT WITH PREVIOUSLY REPORTED AND CALLED VALUE.    Culture (A)  Final    PSEUDOMONAS AERUGINOSA SUSCEPTIBILITIES PERFORMED ON PREVIOUS CULTURE WITHIN THE LAST 5  DAYS.  Performed at [facility], [address] [place], [place] [address]    Report Status 06/09/2024 FINAL  Final  Blood culture (routine x 2)     Status: Abnormal   Collection Time: 06/06/24 12:03 PM   Specimen: BLOOD  Result Value Ref Range Status   Specimen Description   Final    BLOOD RIGHT ANTECUBITAL Performed at [facility], [address] [place], [place] [address]    Special Requests   Final    BOTTLES DRAWN AEROBIC AND ANAEROBIC Blood Culture adequate volume Performed at [facility], [address], [place], [place] [address]    Culture  Setup Time   Final    GRAM NEGATIVE RODS IN BOTH AEROBIC AND ANAEROBIC BOTTLES CRITICAL RESULT CALLED TO, READ BACK BY AND VERIFIED WITHBETHA RONAL HENCH PHARMD, AT 9188 06/07/24 D. VANHOOK Performed at [facility], [address] [place], [place] [address]    Culture PSEUDOMONAS AERUGINOSA (A)  Final   Report Status 06/09/2024 FINAL  Final   Organism ID, Bacteria PSEUDOMONAS AERUGINOSA  Final      Susceptibility   Pseudomonas aeruginosa - MIC*    MEROPENEM  1 SENSITIVE Sensitive     CIPROFLOXACIN 0.5 SENSITIVE Sensitive     IMIPENEM  2 SENSITIVE Sensitive     CEFEPIME  2 SENSITIVE Sensitive     CEFTAZIDIME/AVIBACTAM 2 SENSITIVE Sensitive ug/mL    CEFTOLOZANE/TAZOBACTAM 0.5 SENSITIVE Sensitive ug/mL    TOBRAMYCIN <=1 SENSITIVE Sensitive     CEFTAZIDIME 4 SENSITIVE Sensitive     * PSEUDOMONAS AERUGINOSA  Blood Culture ID Panel (Reflexed)     Status: Abnormal   Collection Time: 06/06/24 12:03 PM  Result Value Ref Range Status   Enterococcus faecalis NOT DETECTED NOT DETECTED Final   Enterococcus Faecium NOT DETECTED NOT DETECTED Final   Listeria monocytogenes NOT DETECTED NOT DETECTED Final   Staphylococcus species NOT DETECTED NOT DETECTED Final   Staphylococcus aureus (BCID) NOT DETECTED NOT DETECTED Final   Staphylococcus epidermidis NOT DETECTED NOT DETECTED Final    Staphylococcus lugdunensis NOT DETECTED NOT DETECTED Final   Streptococcus species NOT DETECTED NOT DETECTED Final   Streptococcus agalactiae NOT DETECTED NOT DETECTED Final   Streptococcus pneumoniae NOT DETECTED NOT DETECTED Final   Streptococcus pyogenes NOT DETECTED NOT DETECTED Final   A.calcoaceticus-baumannii NOT DETECTED NOT DETECTED Final   Bacteroides fragilis NOT DETECTED NOT DETECTED Final   Enterobacterales NOT DETECTED NOT DETECTED Final   Enterobacter cloacae complex NOT DETECTED NOT DETECTED Final   Escherichia coli NOT DETECTED NOT DETECTED Final   Klebsiella aerogenes NOT DETECTED NOT DETECTED Final   Klebsiella oxytoca NOT DETECTED NOT DETECTED Final   Klebsiella pneumoniae NOT DETECTED NOT DETECTED Final   Proteus species NOT DETECTED NOT DETECTED Final   Salmonella species NOT DETECTED NOT DETECTED Final   Serratia marcescens NOT DETECTED NOT DETECTED Final   Haemophilus influenzae NOT DETECTED NOT DETECTED Final   Neisseria meningitidis NOT DETECTED NOT DETECTED Final   Pseudomonas aeruginosa DETECTED (A) NOT DETECTED Final    Comment: CRITICAL RESULT CALLED TO, READ BACK BY AND VERIFIED WITHBETHA RONAL HENCH PHARMD, AT 9188 06/07/24 D. VANHOOK    Stenotrophomonas maltophilia NOT DETECTED NOT DETECTED Final   Candida albicans NOT DETECTED NOT DETECTED Final   Candida auris NOT DETECTED NOT DETECTED Final   Candida glabrata NOT DETECTED NOT DETECTED Final   Candida krusei NOT DETECTED NOT DETECTED Final   Candida parapsilosis NOT DETECTED NOT DETECTED Final   Candida  tropicalis NOT DETECTED NOT DETECTED Final   Cryptococcus neoformans/gattii NOT DETECTED NOT DETECTED Final   CTX-M ESBL NOT DETECTED NOT DETECTED Final   Carbapenem resistance IMP NOT DETECTED NOT DETECTED Final   Carbapenem resistance KPC NOT DETECTED NOT DETECTED Final   Carbapenem resistance NDM NOT DETECTED NOT DETECTED Final   Carbapenem resistance VIM NOT DETECTED NOT DETECTED Final    Comment:  Performed at [facility], [address] [place], [place] [address]  Culture, blood (Routine X 2) w Reflex to ID Panel     Status: None (Preliminary result)   Collection Time: 06/09/24  5:02 AM   Specimen: BLOOD LEFT HAND  Result Value Ref Range Status   Specimen Description   Final    BLOOD LEFT HAND Performed at [facility], [address], [place], [place] [address]    Special Requests   Final    Blood Culture adequate volume BOTTLES DRAWN AEROBIC AND ANAEROBIC Performed at [facility], [address] [place], [place] [address]    Culture   Final    NO GROWTH 2 DAYS Performed at [facility], [address] [place] [address] [place], [place] [address]    Report Status PENDING  Incomplete  Culture, blood (Routine X 2) w Reflex to ID Panel     Status: None (Preliminary result)   Collection Time: 06/09/24  5:09 AM   Specimen: BLOOD LEFT HAND  Result Value Ref Range Status   Specimen Description   Final    BLOOD LEFT HAND Performed at [facility], [address], [place], [place] [address]    Special Requests   Final    Blood Culture results may not be optimal due to an inadequate volume of blood received in culture bottles BOTTLES DRAWN AEROBIC AND ANAEROBIC Performed at [facility], [address] [place], [place] [address]    Culture   Final    NO GROWTH 2 DAYS Performed at [facility], [address] [place], [place] [address]    Report Status PENDING  Incomplete     Studies: ECHO TEE Result Date: 06/09/2024    TRANSESOPHOGEAL ECHO REPORT   Patient Name:   Laurie Terry [facility] Date of Exam: 06/09/2024 Medical Rec #:  [id number]           Height:       65.0 in Accession #:    [id number]          Weight:       158.3 lb Date of Birth:  15-Aug-1966           BSA:          1.791 m Patient Age:    58 years            BP:           108/73 mmHg Patient Gender: F                   HR:           62 bpm. Exam Location:  Inpatient  Procedure: Transesophageal Echo, Cardiac Doppler and Color Doppler (Both            Spectral and Color Flow Doppler were utilized during procedure). Indications:     Endocarditis  History:         Patient has prior history of Echocardiogram examinations, most  recent 05/25/2024. Arrythmias:PAC, Tachycardia and Bradycardia;                  Risk Factors:Dyslipidemia.  Sonographer:     Thea Norlander RCS Referring Phys:  [id number] WADDELL A PARCELLS Diagnosing Phys: Jerel Balding MD PROCEDURE: After discussion of the risks and benefits of a TEE, an informed consent was obtained from the patient. The transesophogeal probe was passed without difficulty through the esophogus of the patient. Imaged were obtained with the patient in a left lateral decubitus position. Sedation performed by different physician. The patient was monitored while under deep sedation. Anesthestetic sedation was provided intravenously by Anesthesiology: 207.96mg  of Propofol , 100mg  of Lidocaine . The patient developed no complications during the procedure.  IMPRESSIONS  1. Left ventricular ejection fraction, by estimation, is 60 to 65%. The left ventricle has normal function.  2. Right ventricular systolic function is normal. The right ventricular size is normal.  3. No left atrial/left atrial appendage thrombus was detected.  4. The mitral valve is normal in structure. Mild mitral valve regurgitation.  5. The aortic valve is normal in structure. Aortic valve regurgitation is trivial.  6. Aortic dilatation noted. There is mild dilatation of the aortic root, measuring 42 mm. There is borderline dilatation of the ascending aorta, measuring 40 mm.  7. Evidence of atrial level shunting detected by color flow Doppler. There is a small patent foramen ovale with predominantly left to right shunting across the atrial septum. FINDINGS  Left Ventricle: Left ventricular ejection fraction, by estimation, is 60 to 65%. The left ventricle has normal  function. The left ventricular internal cavity size was normal in size. Right Ventricle: The right ventricular size is normal. No increase in right ventricular wall thickness. Right ventricular systolic function is normal. Left Atrium: Left atrial size was normal in size. No left atrial/left atrial appendage thrombus was detected. Right Atrium: Right atrial size was normal in size. Pericardium: There is no evidence of pericardial effusion. Mitral Valve: The mitral valve is normal in structure. Mild mitral valve regurgitation. Tricuspid Valve: The tricuspid valve is normal in structure. Tricuspid valve regurgitation is not demonstrated. Aortic Valve: The aortic valve is normal in structure. Aortic valve regurgitation is trivial. Pulmonic Valve: The pulmonic valve was grossly normal. Pulmonic valve regurgitation is not visualized. Aorta: Aortic dilatation noted. There is mild dilatation of the aortic root, measuring 42 mm. There is borderline dilatation of the ascending aorta, measuring 40 mm. There is minimal (Grade I) plaque. IAS/Shunts: Evidence of atrial level shunting detected by color flow Doppler. A small patent foramen ovale is detected with predominantly left to right shunting across the atrial septum. Additional Comments: Spectral Doppler performed. LEFT VENTRICLE PLAX 2D LVOT diam:     2.00 cm LVOT Area:     3.14 cm   AORTA Ao Root diam: 4.20 cm Ao Asc diam:  4.00 cm  SHUNTS Systemic Diam: 2.00 cm Jerel Balding MD Electronically signed by Jerel Balding MD Signature Date/Time: 06/09/2024/6:01:22 PM    Final    EP STUDY Result Date: 06/09/2024 See surgical note for result.     Tanji Storrs, MD  [facility] 06/11/2024  If 7PM-7AM, please contact night-coverage

## 2024-06-11 NOTE — TOC Initial Note (Signed)
 Transition of Care [facility]) - Initial/Assessment Note    Patient Details  Name: Laurie Terry MRN: [id number] Date of Birth: 06-18-1966  Transition of Care [facility]) CM/SW Contact:    Bascom Service, RN Phone Number: 06/11/2024, 3:59 PM  Clinical Narrative: Spoke to patient aobut d/c plans-d/c home w/Iv abx-Pam w/[facility] already following for home iv initial instruction;[facility] rep Amy for HHRN-iv abx home instruction. Has support,& own transport home. Await HHC orders. & PICC line.                  Expected Discharge Plan: Home w Home Health Services Barriers to Discharge: Continued Medical Work up   Patient Goals and CMS Choice Patient states their goals for this hospitalization and ongoing recovery are:: Home CMS Medicare.gov Compare Post Acute Care list provided to:: Patient Choice offered to / list presented to : Patient [place] ownership interest in [facility]provided to:: Patient    Expected Discharge Plan and Services   Discharge Planning Services: CM Consult Post Acute Care Choice: Home Health Living arrangements for the past 2 months: Single Family Home                           HH Arranged: RN, IV Antibiotics HH Agency: [facility] ([facility]-IV abx infusion) Date HH Agency Contacted: 06/11/24 Time HH Agency Contacted: 1559 Representative spoke with at [facility] Agency: Amy-[facility] rep Pam  Prior Living Arrangements/Services Living arrangements for the past 2 months: Single Family Home Lives with:: Spouse   Do you feel safe going back to the place where you live?: Yes               Activities of Daily Living   ADL Screening (condition at time of admission) Independently performs ADLs?: Yes (appropriate for developmental age) Does the patient have a NEW difficulty with bathing/dressing/toileting/self-feeding that is expected to last >3 days?: No Does the patient have a NEW difficulty with getting in/out of bed, walking, or  climbing stairs that is expected to last >3 days?: No Does the patient have a NEW difficulty with communication that is expected to last >3 days?: No Is the patient deaf or have difficulty hearing?: No Does the patient have difficulty seeing, even when wearing glasses/contacts?: No Does the patient have difficulty concentrating, remembering, or making decisions?: No  Permission Sought/Granted                  Emotional Assessment              Admission diagnosis:  Sepsis (HCC) [A41.9] Patient Active Problem List   Diagnosis Date Noted   Acute bacterial endocarditis 06/09/2024   Liver abscess 06/08/2024   Temporal giant cell arteritis (HCC) 06/08/2024   Severe sepsis (HCC) 06/06/2024   PMR (polymyalgia rheumatica) (HCC) 06/06/2024   Pubic bone pain 05/27/2024   Sinus bradycardia 05/26/2024   Bacteremia due to Pseudomonas 05/24/2024   Normocytic anemia 05/24/2024   Dilatation of aorta (HCC) 01/24/2023   Perinephric abscess 03/03/2022   Norovirus 02/15/2022   Dry cough 02/02/2022   Iatrogenic subdiaphragmatic abscess (HCC) 02/02/2022   Pleural effusion on right    Post-ERCP acute pancreatitis 01/16/2022   GERD without esophagitis 01/16/2022   Choledocholithiasis 01/16/2022   Adverse effect of other drugs, medicaments and biological substances, subsequent encounter 05/23/2021   Other allergic rhinitis 05/23/2021   Allergic conjunctivitis of both eyes 05/23/2021   History of asthma 05/23/2021   Oral allergy syndrome,  subsequent encounter 05/23/2021   Family history of bicuspid aortic valve 11/28/2020   Other hyperlipidemia 11/28/2020   PAC (premature atrial contraction) 10/30/2020   Postoperative bile leak 04/12/2016   Acute hypokalemia 04/12/2016   Paroxysmal SVT (supraventricular tachycardia) (HCC) 04/12/2016   Bile leak, postoperative 04/12/2016   S/P laparoscopic cholecystectomy    History of Gallstone pancreatitis 04/07/2016   Pancreatitis due to biliary  obstruction 04/07/2016   Cancer (HCC)    Osteopenia    PCP:  [name], MD Pharmacy:   [facility] [phone] [address], [place] - [address] AT [facility] [address] [facility] [address] [place] [address] Phone: [phone] Fax: [phone]     Social Drivers of Health (SDOH) Social History: SDOH Screenings   Food Insecurity: No Food Insecurity (06/06/2024)  Housing: Low Risk  (06/06/2024)  Transportation Needs: No Transportation Needs (06/06/2024)  Utilities: Not At Risk (06/06/2024)  Depression (PHQ2-9): Low Risk  (10/13/2023)  Social Connections: Moderately Integrated (05/22/2024)  Tobacco Use: Medium Risk (06/06/2024)   SDOH Interventions:     Readmission Risk Interventions    06/07/2024    2:25 PM  Readmission Risk Prevention Plan  Transportation Screening Complete  PCP or Specialist Appt within 3-5 Days Complete  HRI or Home Care Consult Complete  Social Work Consult for Recovery Care Planning/Counseling Complete  Palliative Care Screening Not Applicable  Medication Review Oceanographer) Complete

## 2024-06-11 NOTE — Progress Notes (Signed)
 [facility] Infectious Diseases Follow Up Note  Patient Identification: Patient Name: Laurie Terry MRN: [id number] Admit Date: 06/06/2024 11:00 AM Age: 58 y.o.Today's Date: 06/11/2024  Reason for Visit: Pseudomonas bacteremia, liver abscess, septic pulmonary emboli  Principal Problem:   Severe sepsis (HCC) Active Problems:   Bacteremia due to Pseudomonas   Normocytic anemia   PMR (polymyalgia rheumatica) (HCC)   Liver abscess   Temporal giant cell arteritis (HCC)   Acute bacterial endocarditis  Antibiotics:  Linezolid  9/18 Meropenem  9/15- Total days of antibiotics 6  Atovaquone  ppx   Lines/Hardware:   Interval Events: Remains afebrile CBC BMP unremarkable Daptomycin  switched to linezolid  yesterday for pulmonary coverage   Assessment 58 year old female with prior history with recently diagnosed giant cell arteritis with PMR on prednisone  and prophylactic atovaquone , h/o intra-abdominal abscess (subhepatic/perinephric, Cx with E faecalis and Candida albicans) following ERCP in 2023 treated with Augmentin  and fluconazole  and most recentl admission 8/29-9/7 with Klebsiella pneumonia bacteremia, Enterococcus hepatic abscess, osteitis pubis ( discharged on PO amoxicillin  and cefadroxil ) who presented to the ED on 8/14 with fatigue, lightheadedness for few days.   # PsA bacteremia with CT showing with concerns for multifocal cavitary pneumonia/septic emboli as well as hepatic abscesses - Hepatic abscesses appear to be small in size for IR drainage - 9/17 TEE with no vegetation, small PFO with left-to-right shunt, per cardiology Comments - likely GI source of recurrent infection   # Giant cell arteritis with PMR - Prednisone  dosing decreased to 15 mg daily - on prophylactic atovaquone     Recommendations - DC linezolid  and start ampicillin , continue meropenem ,dual B lactam treatment as no other feasible options to cover  PsA, Kleb pneumoniae as well as E faecium and better lung coverage given h/o allergies.  - Follow-up repeat blood cultures from 9/17 to be negative for at least 72 hrs and needs to have double lumen PICC placed for 6 weeks IV antibiotics.  - Monitor CBC and CMP - Universal/standard isolation precaution Follow peripherally over the weekend.   Rest of the management as per the primary team. Thank you for the consult. Please page with pertinent questions or concerns.  ______________________________________________________________________ Subjective patient seen and examined at the bedside.  Occasional right upper quadrant, epigastric pain.  Denies nausea, vomiting or diarrhea.  Denies chest pain, cough or shortness of breath.   Vitals BP 112/63 (BP Location: Left Arm)   Pulse 75   Temp 98.6 F (37 C) (Oral)   Resp 16   Ht 5' 5 (1.651 m)   Wt 71.8 kg   SpO2 97%   BMI 26.34 kg/m     Physical Exam Constitutional: Sitting in the recliner, comfortable    Comments HEENT WNL  Cardiovascular:     Rate and Rhythm: Normal rate and regular rhythm.     Heart sounds:   Pulmonary:     Effort: Pulmonary effort is normal.     Comments:   Abdominal:     Palpations: Abdomen is soft.     Tenderness: Nondistended and nontender  Musculoskeletal:        General: No swelling or tenderness in peripheral joints.  No signs of septic joint  Skin:    Comments: No rashes  Neurological:     General: Awake, alert and oriented, grossly nonfocal  Psychiatric:        Mood and Affect: Mood normal.   Pertinent Microbiology Results for orders placed or performed during the hospital encounter of 06/06/24  Blood culture (routine x 2)  Status: Abnormal   Collection Time: 06/06/24 11:12 AM   Specimen: BLOOD  Result Value Ref Range Status   Specimen Description   Final    BLOOD LEFT ANTECUBITAL Performed at [facility], [address] [place], [place] [address]    Special  Requests   Final    BOTTLES DRAWN AEROBIC AND ANAEROBIC Blood Culture results may not be optimal due to an inadequate volume of blood received in culture bottles Performed at [facility], [address] [place], [place] [address]    Culture  Setup Time   Final    GRAM NEGATIVE RODS IN BOTH AEROBIC AND ANAEROBIC BOTTLES CRITICAL RESULT CALLED TO, READ BACK BY AND VERIFIED WITHBETHA RONAL HENCH PHARMD, AT 9188 06/07/24 D. VANHOOK CRITICAL VALUE NOTED.  VALUE IS CONSISTENT WITH PREVIOUSLY REPORTED AND CALLED VALUE.    Culture (A)  Final    PSEUDOMONAS AERUGINOSA SUSCEPTIBILITIES PERFORMED ON PREVIOUS CULTURE WITHIN THE LAST 5 DAYS. Performed at [facility], [address], [place], [place] [address]    Report Status 06/09/2024 FINAL  Final  Blood culture (routine x 2)     Status: Abnormal   Collection Time: 06/06/24 12:03 PM   Specimen: BLOOD  Result Value Ref Range Status   Specimen Description   Final    BLOOD RIGHT ANTECUBITAL Performed at [facility], [address], [place], [place] [address]    Special Requests   Final    BOTTLES DRAWN AEROBIC AND ANAEROBIC Blood Culture adequate volume Performed at [facility], [address] [place], [place] [address]    Culture  Setup Time   Final    GRAM NEGATIVE RODS IN BOTH AEROBIC AND ANAEROBIC BOTTLES CRITICAL RESULT CALLED TO, READ BACK BY AND VERIFIED WITHBETHA RONAL HENCH PHARMD, AT 9188 06/07/24 D. VANHOOK Performed at [facility], [address], [place], [place] [address]    Culture PSEUDOMONAS AERUGINOSA (A)  Final   Report Status 06/09/2024 FINAL  Final   Organism ID, Bacteria PSEUDOMONAS AERUGINOSA  Final      Susceptibility   Pseudomonas aeruginosa - MIC*    MEROPENEM  1 SENSITIVE Sensitive     CIPROFLOXACIN 0.5 SENSITIVE Sensitive     IMIPENEM  2 SENSITIVE Sensitive     CEFEPIME  2 SENSITIVE Sensitive     CEFTAZIDIME/AVIBACTAM 2 SENSITIVE Sensitive ug/mL     CEFTOLOZANE/TAZOBACTAM 0.5 SENSITIVE Sensitive ug/mL    TOBRAMYCIN <=1 SENSITIVE Sensitive     CEFTAZIDIME 4 SENSITIVE Sensitive     * PSEUDOMONAS AERUGINOSA  Blood Culture ID Panel (Reflexed)     Status: Abnormal   Collection Time: 06/06/24 12:03 PM  Result Value Ref Range Status   Enterococcus faecalis NOT DETECTED NOT DETECTED Final   Enterococcus Faecium NOT DETECTED NOT DETECTED Final   Listeria monocytogenes NOT DETECTED NOT DETECTED Final   Staphylococcus species NOT DETECTED NOT DETECTED Final   Staphylococcus aureus (BCID) NOT DETECTED NOT DETECTED Final   Staphylococcus epidermidis NOT DETECTED NOT DETECTED Final   Staphylococcus lugdunensis NOT DETECTED NOT DETECTED Final   Streptococcus species NOT DETECTED NOT DETECTED Final   Streptococcus agalactiae NOT DETECTED NOT DETECTED Final   Streptococcus pneumoniae NOT DETECTED NOT DETECTED Final   Streptococcus pyogenes NOT DETECTED NOT DETECTED Final   A.calcoaceticus-baumannii NOT DETECTED NOT DETECTED Final   Bacteroides fragilis NOT DETECTED NOT DETECTED Final   Enterobacterales NOT DETECTED NOT DETECTED Final   Enterobacter cloacae complex NOT DETECTED NOT DETECTED Final   Escherichia coli  NOT DETECTED NOT DETECTED Final   Klebsiella aerogenes NOT DETECTED NOT DETECTED Final   Klebsiella oxytoca NOT DETECTED NOT DETECTED Final   Klebsiella pneumoniae NOT DETECTED NOT DETECTED Final   Proteus species NOT DETECTED NOT DETECTED Final   Salmonella species NOT DETECTED NOT DETECTED Final   Serratia marcescens NOT DETECTED NOT DETECTED Final   Haemophilus influenzae NOT DETECTED NOT DETECTED Final   Neisseria meningitidis NOT DETECTED NOT DETECTED Final   Pseudomonas aeruginosa DETECTED (A) NOT DETECTED Final    Comment: CRITICAL RESULT CALLED TO, READ BACK BY AND VERIFIED WITHBETHA RONAL HENCH PHARMD, AT 9188 06/07/24 D. VANHOOK    Stenotrophomonas maltophilia NOT DETECTED NOT DETECTED Final   Candida albicans NOT DETECTED  NOT DETECTED Final   Candida auris NOT DETECTED NOT DETECTED Final   Candida glabrata NOT DETECTED NOT DETECTED Final   Candida krusei NOT DETECTED NOT DETECTED Final   Candida parapsilosis NOT DETECTED NOT DETECTED Final   Candida tropicalis NOT DETECTED NOT DETECTED Final   Cryptococcus neoformans/gattii NOT DETECTED NOT DETECTED Final   CTX-M ESBL NOT DETECTED NOT DETECTED Final   Carbapenem resistance IMP NOT DETECTED NOT DETECTED Final   Carbapenem resistance KPC NOT DETECTED NOT DETECTED Final   Carbapenem resistance NDM NOT DETECTED NOT DETECTED Final   Carbapenem resistance VIM NOT DETECTED NOT DETECTED Final    Comment: Performed at [facility], [address], [place], [place] [address]  Culture, blood (Routine X 2) w Reflex to ID Panel     Status: None (Preliminary result)   Collection Time: 06/09/24  5:02 AM   Specimen: BLOOD LEFT HAND  Result Value Ref Range Status   Specimen Description   Final    BLOOD LEFT HAND Performed at [facility], [address], [place], [place] [address]    Special Requests   Final    Blood Culture adequate volume BOTTLES DRAWN AEROBIC AND ANAEROBIC Performed at [facility], [address], [place], [place] [address]    Culture   Final    NO GROWTH 1 DAY Performed at [facility], [address], [place], [place] [address]    Report Status PENDING  Incomplete  Culture, blood (Routine X 2) w Reflex to ID Panel     Status: None (Preliminary result)   Collection Time: 06/09/24  5:09 AM   Specimen: BLOOD LEFT HAND  Result Value Ref Range Status   Specimen Description   Final    BLOOD LEFT HAND Performed at [facility], [address] [place], [place] [address]    Special Requests   Final    Blood Culture results may not be optimal due to an inadequate volume of blood received in culture bottles BOTTLES DRAWN AEROBIC AND ANAEROBIC Performed at [facility], [address] [place], [place] [address]    Culture   Final    NO GROWTH 1 DAY Performed at [facility], [address], [place], [place] [address]    Report Status PENDING  Incomplete    Pertinent Lab.    Latest Ref Rng & Units 06/11/2024    4:44 AM 06/10/2024    5:04 AM 06/07/2024    4:19 AM  CBC  WBC 4.0 - 10.5 K/uL 8.8  7.5  8.5   Hemoglobin 12.0 - 15.0 g/dL 89.5  89.5  9.0   Hematocrit 36.0 - 46.0 % 34.3  32.6  28.7   Platelets  150 - 400 K/uL 227  203  199       Latest Ref Rng & Units 06/11/2024    4:44 AM 06/10/2024    5:04 AM 06/07/2024    4:19 AM  CMP  Glucose 70 - 99 mg/dL 67  73  897   BUN 6 - 20 mg/dL 12  10  11    Creatinine 0.44 - 1.00 mg/dL 9.44  9.48  9.50   Sodium 135 - 145 mmol/L 140  142  142   Potassium 3.5 - 5.1 mmol/L 3.7  3.6  3.3   Chloride 98 - 111 mmol/L 104  104  106   CO2 22 - 32 mmol/L 25  27  25    Calcium 8.9 - 10.3 mg/dL 8.2  8.7  8.0      Pertinent Imaging today Plain films and CT images have been personally visualized and interpreted; radiology reports have been reviewed. Decision making incorporated into the Impression /   ECHO TEE Result Date: 06/09/2024    TRANSESOPHOGEAL ECHO REPORT   Patient Name:   DALAINA TATES [facility] Date of Exam: 06/09/2024 Medical Rec #:  [id number]           Height:       65.0 in Accession #:    [id number]          Weight:       158.3 lb Date of Birth:  March 14, 1966           BSA:          1.791 m Patient Age:    58 years            BP:           108/73 mmHg Patient Gender: F                   HR:           62 bpm. Exam Location:  Inpatient Procedure: Transesophageal Echo, Cardiac Doppler and Color Doppler (Both            Spectral and Color Flow Doppler were utilized during procedure). Indications:     Endocarditis  History:         Patient has prior history of Echocardiogram examinations, most                  recent 05/25/2024. Arrythmias:PAC, Tachycardia and Bradycardia;                  Risk Factors:Dyslipidemia.  Sonographer:      Thea Norlander RCS Referring Phys:  [id number] WADDELL A PARCELLS Diagnosing Phys: Jerel Balding MD PROCEDURE: After discussion of the risks and benefits of a TEE, an informed consent was obtained from the patient. The transesophogeal probe was passed without difficulty through the esophogus of the patient. Imaged were obtained with the patient in a left lateral decubitus position. Sedation performed by different physician. The patient was monitored while under deep sedation. Anesthestetic sedation was provided intravenously by Anesthesiology: 207.96mg  of Propofol , 100mg  of Lidocaine . The patient developed no complications during the procedure.  IMPRESSIONS  1. Left ventricular ejection fraction, by estimation, is 60 to 65%. The left ventricle has normal function.  2. Right ventricular systolic function is normal. The right ventricular size is normal.  3. No left atrial/left atrial appendage thrombus was detected.  4. The mitral valve is normal in structure. Mild mitral valve regurgitation.  5. The aortic valve is normal in structure. Aortic valve regurgitation  is trivial.  6. Aortic dilatation noted. There is mild dilatation of the aortic root, measuring 42 mm. There is borderline dilatation of the ascending aorta, measuring 40 mm.  7. Evidence of atrial level shunting detected by color flow Doppler. There is a small patent foramen ovale with predominantly left to right shunting across the atrial septum. FINDINGS  Left Ventricle: Left ventricular ejection fraction, by estimation, is 60 to 65%. The left ventricle has normal function. The left ventricular internal cavity size was normal in size. Right Ventricle: The right ventricular size is normal. No increase in right ventricular wall thickness. Right ventricular systolic function is normal. Left Atrium: Left atrial size was normal in size. No left atrial/left atrial appendage thrombus was detected. Right Atrium: Right atrial size was normal in size. Pericardium:  There is no evidence of pericardial effusion. Mitral Valve: The mitral valve is normal in structure. Mild mitral valve regurgitation. Tricuspid Valve: The tricuspid valve is normal in structure. Tricuspid valve regurgitation is not demonstrated. Aortic Valve: The aortic valve is normal in structure. Aortic valve regurgitation is trivial. Pulmonic Valve: The pulmonic valve was grossly normal. Pulmonic valve regurgitation is not visualized. Aorta: Aortic dilatation noted. There is mild dilatation of the aortic root, measuring 42 mm. There is borderline dilatation of the ascending aorta, measuring 40 mm. There is minimal (Grade I) plaque. IAS/Shunts: Evidence of atrial level shunting detected by color flow Doppler. A small patent foramen ovale is detected with predominantly left to right shunting across the atrial septum. Additional Comments: Spectral Doppler performed. LEFT VENTRICLE PLAX 2D LVOT diam:     2.00 cm LVOT Area:     3.14 cm   AORTA Ao Root diam: 4.20 cm Ao Asc diam:  4.00 cm  SHUNTS Systemic Diam: 2.00 cm Jerel Balding MD Electronically signed by Jerel Balding MD Signature Date/Time: 06/09/2024/6:01:22 PM    Final    EP STUDY Result Date: 06/09/2024 See surgical note for result.  I spent 50 minutes involved in face-to-face and non-face-to-face activities for this patient on the day of the visit. Professional time spent includes the following activities: Preparing to see the patient (review of tests), Obtaining and reviewing separately obtained history (hospitalist progress note), Performing a medically appropriate examination and evaluation , Ordering medications/labs, referring and communicating with other health care professionals including ID pharmacy, Documenting clinical information in the EMR, Independently interpreting results (not separately reported), Communicating results to the patient, Counseling and educating the patient and Care coordination (not separately reported).   Plan d/w  requesting provider as well as ID pharm D  Of note, portions of this note may have been created with voice recognition software. While this note has been edited for accuracy, occasional wrong-word or 'sound-a-like' substitutions may have occurred due to the inherent limitations of voice recognition software.   Electronically signed by:   Annalee Orem, MD Infectious Disease Physician [facility] for Infectious Disease Pager: [phone]

## 2024-06-12 DIAGNOSIS — R6520 Severe sepsis without septic shock: Secondary | ICD-10-CM | POA: Diagnosis not present

## 2024-06-12 DIAGNOSIS — A419 Sepsis, unspecified organism: Secondary | ICD-10-CM | POA: Diagnosis not present

## 2024-06-12 LAB — BASIC METABOLIC PANEL WITH GFR
Anion gap: 10 (ref 5–15)
BUN: 9 mg/dL (ref 6–20)
CO2: 27 mmol/L (ref 22–32)
Calcium: 8.6 mg/dL — ABNORMAL LOW (ref 8.9–10.3)
Chloride: 102 mmol/L (ref 98–111)
Creatinine, Ser: 0.64 mg/dL (ref 0.44–1.00)
GFR, Estimated: >60 mL/min (ref >60)
Glucose, Bld: 76 mg/dL (ref 70–99)
Potassium: 4 mmol/L (ref 3.5–5.1)
Sodium: 139 mmol/L (ref 135–145)

## 2024-06-12 LAB — CBC WITH DIFFERENTIAL/PLATELET
Abs Immature Granulocytes: 0.44 K/uL — ABNORMAL HIGH (ref 0.00–0.07)
Basophils Absolute: 0.1 K/uL (ref 0.0–0.1)
Basophils Relative: 1 %
Eosinophils Absolute: 0.3 K/uL (ref 0.0–0.5)
Eosinophils Relative: 3 %
HCT: 35.6 % — ABNORMAL LOW (ref 36.0–46.0)
Hemoglobin: 10.8 g/dL — ABNORMAL LOW (ref 12.0–15.0)
Immature Granulocytes: 4 %
Lymphocytes Relative: 22 %
Lymphs Abs: 2.3 K/uL (ref 0.7–4.0)
MCH: 28.4 pg (ref 26.0–34.0)
MCHC: 30.3 g/dL (ref 30.0–36.0)
MCV: 93.7 fL (ref 80.0–100.0)
Monocytes Absolute: 0.7 K/uL (ref 0.1–1.0)
Monocytes Relative: 7 %
Neutro Abs: 6.5 K/uL (ref 1.7–7.7)
Neutrophils Relative %: 63 %
Platelets: 260 K/uL (ref 150–400)
RBC: 3.8 MIL/uL — ABNORMAL LOW (ref 3.87–5.11)
RDW: 17.7 % — ABNORMAL HIGH (ref 11.5–15.5)
WBC: 10.3 K/uL (ref 4.0–10.5)
nRBC: 0.2 % (ref 0.0–0.2)

## 2024-06-12 NOTE — Progress Notes (Signed)
 PROGRESS NOTE  Laurie Terry FMW:[id number] DOB: Feb 13, 1966 DOA: 06/06/2024 PCP: [name], MD   LOS: 6 days   Brief narrative:  Patient is a 58 years old female with past medical history of giant cell arteritis and PMR on prednisone , recent Klebsiella bacteremia and Enterococcus liver abscess, presented to the hospital on 06/06/2024 with lightheadedness and low blood pressure.  Of note patient was admitted between 829 25-8 9 and 725 for acute thrombocytopenia which was felt secondary to infection and received 2 days of IVIG.  Further workup revealed that patient had Klebsiella bacteremia and liver abscess which grew Enterococcus faecalis.  ID was consulted and was initially treated with daptomycin  Rocephin  and Flagyl  and was discharged home on amoxicillin  and cefadroxil  for total of 4-week course.  On 06/02/2024, patient underwent EUS for evaluation of pneumobilia without any gallstones.  Subsequently patient started feeling lightheaded with some shortness of breath intermittent dry cough and went to GI clinic.  In the clinic patient was noted to be hypotensive and was sent to the ED for further evaluation and treatment.  In the ED patient had mild tachycardia, tachypnea and border pressure was 100/67.  Labs show WBC at 17.5 potassium low at 3.5.  Lactate was initially elevated to 3.8.  CT scan of the abdomen and pelvis showed several scattered lung nodules favoring multifocal cavitary pneumonia with scattered hypodense hepatic lesions.  Patient was then considered for admission to the hospital for further evaluation and treatment.    Assessment/Plan: Principal Problem:   Severe sepsis (HCC) Active Problems:   Bacteremia due to Pseudomonas   Normocytic anemia   PMR (polymyalgia rheumatica) (HCC)   Liver abscess   Temporal giant cell arteritis (HCC)   Acute bacterial endocarditis   Severe sepsis secondary to Pseudomonas bacteremia Septic emboli/multifocal cavitary pneumonia Ongoing  hepatic abscesses Recent Klebsiella bacteremia/Enterococcus liver abscess CT chest/abd/pelvis changes suspicious for pulmonary septic emboli as well as persistent hepatic lesions concerning for abscesses.   Patient underwent TEE on 06/09/2024 without any vegetation but small PFO.  Currently on ampicillin , meropenem .  Repeat blood cultures negative in 2 days.  Folow ID recommendations.  Might need PICC line placement and prolonged antibiotics on discharge clear for the weekend..  Mild epigastric tenderness.  History of gastritis.  Unable to tolerate PPI.  On Mylanta and feels better today.  Allergic reaction to cefepime  Antibiotics has been changed.   Giant cell arteritis/PMR Diagnosed by rheumatologist [name] with elevated inflammatory markers.  Patient was on prednisone  50 mg and atovaquone .  Previous provider had spoken with  [name] and, risk/benefit of ongoing prednisone  at high dose discussed, decision making conducted with patient, all concur that decrease prednisone  is in the patient's best interest.  [name] recommended dropping dose to 15 mg daily (PMR dose)   Normocytic anemia Latest hemoglobin of 10.4.  DVT prophylaxis: enoxaparin  (LOVENOX ) injection 40 mg Start: 06/07/24 1000   Disposition: Home likely in 1 to 2 days, ID input on antibiotics  Status is: Inpatient Remains inpatient appropriate because: IV antibiotics, follow blood cultures, ID input on discharge.    Code Status:     Code Status: Full Code  Family Communication: None at bedside  Consultants: Infectious disease Cardiology  Procedures: TEE  Anti-infectives:  Ampicillin ,meropenem   Anti-infectives (From admission, onward)    Start     Dose/Rate Route Frequency Ordered Stop   06/11/24 1200  ampicillin  (OMNIPEN) 2 g in sodium chloride  0.9 % 100 mL IVPB  2 g 300 mL/hr over 20 Minutes Intravenous Every 4 hours 06/11/24 0956     06/10/24 1345  linezolid  (ZYVOX ) IVPB 600 mg  Status:   Discontinued        600 mg 300 mL/hr over 60 Minutes Intravenous Every 12 hours 06/10/24 1259 06/11/24 0956   06/08/24 1400  DAPTOmycin  (CUBICIN ) IVPB 700 mg/100mL premix  Status:  Discontinued        700 mg 200 mL/hr over 30 Minutes Intravenous Daily 06/08/24 0747 06/10/24 1259   06/07/24 2000  meropenem  (MERREM ) 1 g in sodium chloride  0.9 % 100 mL IVPB        1 g 200 mL/hr over 30 Minutes Intravenous Every 8 hours 06/07/24 1132     06/07/24 1400  DAPTOmycin  (CUBICIN ) IVPB 500 mg/50mL premix  Status:  Discontinued        500 mg 100 mL/hr over 30 Minutes Intravenous Daily 06/06/24 2057 06/07/24 0832   06/07/24 1400  DAPTOmycin  (CUBICIN ) IVPB 700 mg/100mL premix  Status:  Discontinued        700 mg 200 mL/hr over 30 Minutes Intravenous Daily 06/07/24 0832 06/07/24 1015   06/07/24 1400  DAPTOmycin  (CUBICIN ) IVPB 700 mg/173mL premix        700 mg 200 mL/hr over 30 Minutes Intravenous Once 06/07/24 1130 06/07/24 1815   06/07/24 1115  piperacillin -tazobactam (ZOSYN ) IVPB 3.375 g  Status:  Discontinued        3.375 g 12.5 mL/hr over 240 Minutes Intravenous Every 8 hours 06/07/24 1015 06/07/24 1132   06/07/24 1000  cefTRIAXone  (ROCEPHIN ) 2 g in sodium chloride  0.9 % 100 mL IVPB  Status:  Discontinued        2 g 200 mL/hr over 30 Minutes Intravenous Every 24 hours 06/06/24 2042 06/07/24 0832   06/07/24 0930  ceFEPIme  (MAXIPIME ) 2 g in sodium chloride  0.9 % 100 mL IVPB  Status:  Discontinued        2 g 200 mL/hr over 30 Minutes Intravenous Every 8 hours 06/07/24 0831 06/07/24 1015   06/07/24 0915  piperacillin -tazobactam (ZOSYN ) IVPB 3.375 g  Status:  Discontinued        3.375 g 12.5 mL/hr over 240 Minutes Intravenous Every 8 hours 06/07/24 0829 06/07/24 0831   06/06/24 2300  atovaquone  (MEPRON ) 750 MG/5ML suspension 750 mg        750 mg Oral 2 times daily 06/06/24 2113     06/06/24 2200  DAPTOmycin  (CUBICIN ) IVPB 700 mg/100mL premix        700 mg 200 mL/hr over 30 Minutes Intravenous Once  06/06/24 2112 06/07/24 0036   06/06/24 1315  DAPTOmycin  (CUBICIN ) IVPB 700 mg/100mL premix  Status:  Discontinued        700 mg 200 mL/hr over 30 Minutes Intravenous Once 06/06/24 1303 06/06/24 2112   06/06/24 1315  fidaxomicin  (DIFICID ) tablet 200 mg  Status:  Discontinued        200 mg Oral 2 times daily 06/06/24 1307 06/06/24 1312   06/06/24 1315  fidaxomicin  (DIFICID ) tablet 200 mg  Status:  Discontinued        200 mg Oral 2 times daily 06/06/24 1307 06/06/24 2107   06/06/24 1300  cefTRIAXone  (ROCEPHIN ) 2 g in sodium chloride  0.9 % 100 mL IVPB        2 g 200 mL/hr over 30 Minutes Intravenous  Once 06/06/24 1252 06/06/24 1408       Subjective: Today, patient was seen and examined at bedside.  Patient feels better  today.  No epigastric discomfort nausea vomiting fever chills or rigor.   Objective: Vitals:   06/11/24 2006 06/12/24 0457  BP: 107/65 104/64  Pulse: 87 77  Resp: 16 18  Temp: 98.3 F (36.8 C) 98.3 F (36.8 C)  SpO2: 99% 98%    Intake/Output Summary (Last 24 hours) at 06/12/2024 1044 Last data filed at 06/12/2024 0525 Gross per 24 hour  Intake 762.49 ml  Output --  Net 762.49 ml   Filed Weights   06/06/24 1825  Weight: 71.8 kg   Body mass index is 26.34 kg/m.   Physical Exam: General:  Average built, not in obvious distress HENT:   No scleral pallor or icterus noted. Oral mucosa is moist.  Chest:  Clear breath sounds.  Diminished breath sounds bilaterally. No crackles or wheezes.  CVS: S1 &S2 heard. No murmur.  Regular rate and rhythm. Abdomen: Soft, nontender, nondistended.  Bowel sounds are heard.   Extremities: No cyanosis, clubbing or edema.  Peripheral pulses are palpable. Psych: Alert, awake and oriented, normal mood CNS:  No cranial nerve deficits.  Power equal in all extremities.   Skin: Warm and dry.  No rashes noted.  Data Review: I have personally reviewed the following laboratory data and studies,  CBC: Recent Labs  Lab 06/06/24 1112  06/07/24 0419 06/10/24 0504 06/11/24 0444 06/12/24 0538  WBC 17.5* 8.5 7.5 8.8 10.3  NEUTROABS  --   --  4.7 5.2 6.5  HGB 11.2* 9.0* 10.4* 10.4* 10.8*  HCT 33.7* 28.7* 32.6* 34.3* 35.6*  MCV 90.3 94.7 93.1 94.2 93.7  PLT 298 199 203 227 260   Basic Metabolic Panel: Recent Labs  Lab 06/06/24 1112 06/07/24 0419 06/10/24 0504 06/11/24 0444 06/12/24 0538  NA 136 142 142 140 139  K 3.5 3.3* 3.6 3.7 4.0  CL 96* 106 104 104 102  CO2 24 25 27 25 27   GLUCOSE 192* 102* 73 67* 76  BUN 15 11 10 12 9   CREATININE 0.80 0.49 0.51 0.55 0.64  CALCIUM 9.0 8.0* 8.7* 8.2* 8.6*   Liver Function Tests: Recent Labs  Lab 06/06/24 1112  AST 25  ALT 44  ALKPHOS 144*  BILITOT 0.7  PROT 7.1  ALBUMIN 3.3*   Recent Labs  Lab 06/06/24 1112  LIPASE 28   No results for input(s): AMMONIA in the last 168 hours. Cardiac Enzymes: Recent Labs  Lab 06/07/24 0419  CKTOTAL 26*   BNP (last 3 results) No results for input(s): BNP in the last 8760 hours.  ProBNP (last 3 results) No results for input(s): PROBNP in the last 8760 hours.  CBG: No results for input(s): GLUCAP in the last 168 hours. Recent Results (from the past 240 hours)  Blood culture (routine x 2)     Status: Abnormal   Collection Time: 06/06/24 11:12 AM   Specimen: BLOOD  Result Value Ref Range Status   Specimen Description   Final    BLOOD LEFT ANTECUBITAL Performed at [facility], [address] [place], [place] [address]    Special Requests   Final    BOTTLES DRAWN AEROBIC AND ANAEROBIC Blood Culture results may not be optimal due to an inadequate volume of blood received in culture bottles Performed at [facility], [address], [place], [place] [address]    Culture  Setup Time   Final    GRAM NEGATIVE RODS IN BOTH AEROBIC AND ANAEROBIC BOTTLES CRITICAL RESULT CALLED TO, READ BACK BY AND VERIFIED WITH: A. LOUANN  PHARMD, AT 9188 06/07/24 D. VANHOOK CRITICAL VALUE  NOTED.  VALUE IS CONSISTENT WITH PREVIOUSLY REPORTED AND CALLED VALUE.    Culture (A)  Final    PSEUDOMONAS AERUGINOSA SUSCEPTIBILITIES PERFORMED ON PREVIOUS CULTURE WITHIN THE LAST 5 DAYS. Performed at [facility], [address] [place], [place] [address]    Report Status 06/09/2024 FINAL  Final  Blood culture (routine x 2)     Status: Abnormal   Collection Time: 06/06/24 12:03 PM   Specimen: BLOOD  Result Value Ref Range Status   Specimen Description   Final    BLOOD RIGHT ANTECUBITAL Performed at [facility], [address], [place], [place] [address]    Special Requests   Final    BOTTLES DRAWN AEROBIC AND ANAEROBIC Blood Culture adequate volume Performed at [facility], [address], [place], [place] [address]    Culture  Setup Time   Final    GRAM NEGATIVE RODS IN BOTH AEROBIC AND ANAEROBIC BOTTLES CRITICAL RESULT CALLED TO, READ BACK BY AND VERIFIED WITHBETHA RONAL HENCH PHARMD, AT 9188 06/07/24 D. VANHOOK Performed at [facility], [address] [place], [place] [address]    Culture PSEUDOMONAS AERUGINOSA (A)  Final   Report Status 06/09/2024 FINAL  Final   Organism ID, Bacteria PSEUDOMONAS AERUGINOSA  Final      Susceptibility   Pseudomonas aeruginosa - MIC*    MEROPENEM  1 SENSITIVE Sensitive     CIPROFLOXACIN 0.5 SENSITIVE Sensitive     IMIPENEM  2 SENSITIVE Sensitive     CEFEPIME  2 SENSITIVE Sensitive     CEFTAZIDIME/AVIBACTAM 2 SENSITIVE Sensitive ug/mL    CEFTOLOZANE/TAZOBACTAM 0.5 SENSITIVE Sensitive ug/mL    TOBRAMYCIN <=1 SENSITIVE Sensitive     CEFTAZIDIME 4 SENSITIVE Sensitive     * PSEUDOMONAS AERUGINOSA  Blood Culture ID Panel (Reflexed)     Status: Abnormal   Collection Time: 06/06/24 12:03 PM  Result Value Ref Range Status   Enterococcus faecalis NOT DETECTED NOT DETECTED Final   Enterococcus Faecium NOT DETECTED NOT DETECTED Final   Listeria monocytogenes NOT DETECTED NOT DETECTED Final    Staphylococcus species NOT DETECTED NOT DETECTED Final   Staphylococcus aureus (BCID) NOT DETECTED NOT DETECTED Final   Staphylococcus epidermidis NOT DETECTED NOT DETECTED Final   Staphylococcus lugdunensis NOT DETECTED NOT DETECTED Final   Streptococcus species NOT DETECTED NOT DETECTED Final   Streptococcus agalactiae NOT DETECTED NOT DETECTED Final   Streptococcus pneumoniae NOT DETECTED NOT DETECTED Final   Streptococcus pyogenes NOT DETECTED NOT DETECTED Final   A.calcoaceticus-baumannii NOT DETECTED NOT DETECTED Final   Bacteroides fragilis NOT DETECTED NOT DETECTED Final   Enterobacterales NOT DETECTED NOT DETECTED Final   Enterobacter cloacae complex NOT DETECTED NOT DETECTED Final   Escherichia coli NOT DETECTED NOT DETECTED Final   Klebsiella aerogenes NOT DETECTED NOT DETECTED Final   Klebsiella oxytoca NOT DETECTED NOT DETECTED Final   Klebsiella pneumoniae NOT DETECTED NOT DETECTED Final   Proteus species NOT DETECTED NOT DETECTED Final   Salmonella species NOT DETECTED NOT DETECTED Final   Serratia marcescens NOT DETECTED NOT DETECTED Final   Haemophilus influenzae NOT DETECTED NOT DETECTED Final   Neisseria meningitidis NOT DETECTED NOT DETECTED Final   Pseudomonas aeruginosa DETECTED (A) NOT DETECTED Final    Comment: CRITICAL RESULT CALLED TO, READ BACK BY AND VERIFIED WITHBETHA RONAL HENCH PHARMD, AT 9188 06/07/24 D. VANHOOK    Stenotrophomonas maltophilia NOT DETECTED NOT DETECTED Final   Candida albicans NOT DETECTED  NOT DETECTED Final   Candida auris NOT DETECTED NOT DETECTED Final   Candida glabrata NOT DETECTED NOT DETECTED Final   Candida krusei NOT DETECTED NOT DETECTED Final   Candida parapsilosis NOT DETECTED NOT DETECTED Final   Candida tropicalis NOT DETECTED NOT DETECTED Final   Cryptococcus neoformans/gattii NOT DETECTED NOT DETECTED Final   CTX-M ESBL NOT DETECTED NOT DETECTED Final   Carbapenem resistance IMP NOT DETECTED NOT DETECTED Final   Carbapenem  resistance KPC NOT DETECTED NOT DETECTED Final   Carbapenem resistance NDM NOT DETECTED NOT DETECTED Final   Carbapenem resistance VIM NOT DETECTED NOT DETECTED Final    Comment: Performed at [facility], [address] [place], [place] [address]  Culture, blood (Routine X 2) w Reflex to ID Panel     Status: None (Preliminary result)   Collection Time: 06/09/24  5:02 AM   Specimen: BLOOD LEFT HAND  Result Value Ref Range Status   Specimen Description   Final    BLOOD LEFT HAND Performed at [facility], [address] [place], [place] [address]    Special Requests   Final    Blood Culture adequate volume BOTTLES DRAWN AEROBIC AND ANAEROBIC Performed at [facility], [address], [place], [place] [address]    Culture   Final    NO GROWTH 3 DAYS Performed at [facility], [address] [place], [place] [address]    Report Status PENDING  Incomplete  Culture, blood (Routine X 2) w Reflex to ID Panel     Status: None (Preliminary result)   Collection Time: 06/09/24  5:09 AM   Specimen: BLOOD LEFT HAND  Result Value Ref Range Status   Specimen Description   Final    BLOOD LEFT HAND Performed at [facility], [address], [place], [place] [address]    Special Requests   Final    Blood Culture results may not be optimal due to an inadequate volume of blood received in culture bottles BOTTLES DRAWN AEROBIC AND ANAEROBIC Performed at [facility], [address], [place], [place] [address]    Culture   Final    NO GROWTH 3 DAYS Performed at [facility], [address] [place], [place] [address]    Report Status PENDING  Incomplete     Studies: No results found.     Maleeka Sabatino, MD  [facility] 06/12/2024  If 7PM-7AM, please contact night-coverage

## 2024-06-13 ENCOUNTER — Other Ambulatory Visit: Payer: Self-pay

## 2024-06-13 DIAGNOSIS — A419 Sepsis, unspecified organism: Secondary | ICD-10-CM | POA: Diagnosis not present

## 2024-06-13 DIAGNOSIS — B965 Pseudomonas (aeruginosa) (mallei) (pseudomallei) as the cause of diseases classified elsewhere: Secondary | ICD-10-CM | POA: Diagnosis not present

## 2024-06-13 DIAGNOSIS — R7881 Bacteremia: Secondary | ICD-10-CM | POA: Diagnosis not present

## 2024-06-13 DIAGNOSIS — M315 Giant cell arteritis with polymyalgia rheumatica: Secondary | ICD-10-CM | POA: Diagnosis not present

## 2024-06-13 DIAGNOSIS — R6520 Severe sepsis without septic shock: Secondary | ICD-10-CM | POA: Diagnosis not present

## 2024-06-13 NOTE — Plan of Care (Signed)

## 2024-06-13 NOTE — Progress Notes (Signed)
 PROGRESS NOTE  Laurie Terry FMW:[id number] DOB: 1966/02/23 DOA: 06/06/2024 PCP: [name], MD   LOS: 7 days   Brief narrative:  Patient is a 57 years old female with past medical history of giant cell arteritis and PMR on prednisone , recent Klebsiella bacteremia and Enterococcus liver abscess, presented to the hospital on 06/06/2024 with lightheadedness and low blood pressure.  Of note patient was admitted between 829 25-8 9 and 725 for acute thrombocytopenia which was felt secondary to infection and received 2 days of IVIG.  Further workup revealed that patient had Klebsiella bacteremia and liver abscess which grew Enterococcus faecalis.  ID was consulted and was initially treated with daptomycin  Rocephin  and Flagyl  and was discharged home on amoxicillin  and cefadroxil  for total of 4-week course.  On 06/02/2024, patient underwent EUS for evaluation of pneumobilia without any gallstones.  Subsequently patient started feeling lightheaded with some shortness of breath intermittent dry cough and went to GI clinic.  In the clinic, patient was noted to be hypotensive and was sent to the ED for further evaluation and treatment.  In the ED patient had mild tachycardia, tachypnea and border pressure was 100/67.  Labs show WBC at 17.5 potassium low at 3.5.  Lactate was initially elevated to 3.8.  CT scan of the abdomen and pelvis showed several scattered lung nodules favoring multifocal cavitary pneumonia with scattered hypodense hepatic lesions.  Patient was then considered for admission to the hospital for further evaluation and treatment.    Assessment/Plan: Principal Problem:   Severe sepsis (HCC) Active Problems:   Bacteremia due to Pseudomonas   Normocytic anemia   PMR (polymyalgia rheumatica) (HCC)   Liver abscess   Temporal giant cell arteritis (HCC)   Acute bacterial endocarditis   Severe sepsis secondary to Pseudomonas bacteremia Septic emboli/multifocal cavitary pneumonia Ongoing  hepatic abscesses Recent Klebsiella bacteremia/Enterococcus liver abscess CT chest/abd/pelvis changes suspicious for pulmonary septic emboli as well as persistent hepatic lesions concerning for abscesses.   Patient underwent TEE on 06/09/2024 without any vegetation but small PFO.  Currently on ampicillin , meropenem .  Repeat blood cultures negative in 4 days.  [facility]ow ID recommendations.  Might need PICC line placement and prolonged antibiotics on discharge clear for the weekend..  Allergic reaction to cefepime  Antibiotics has been changed.   Giant cell arteritis/PMR Diagnosed by rheumatologist [name] with elevated inflammatory markers.  Patient was on prednisone  50 mg and atovaquone .  Previous provider had spoken with  [name] and, risk/benefit of ongoing prednisone  at high dose discussed, decision making conducted with patient, all concur that decrease prednisone  is in the patient's best interest.  [name] recommended dropping dose to 15 mg daily (PMR dose)   Normocytic anemia Latest hemoglobin of 10.8.  DVT prophylaxis: enoxaparin  (LOVENOX ) injection 40 mg Start: 06/07/24 1000   Disposition: Home likely in 1 to 2 days, ID input on antibiotics  Status is: Inpatient Remains inpatient appropriate because: IV antibiotics,     Code Status:     Code Status: Full Code  Family Communication: None at bedside  Consultants: Infectious disease Cardiology  Procedures: TEE  Anti-infectives:  Ampicillin ,meropenem   Anti-infectives (From admission, onward)    Start     Dose/Rate Route Frequency Ordered Stop   06/11/24 1200  ampicillin  (OMNIPEN) 2 g in sodium chloride  0.9 % 100 mL IVPB        2 g 300 mL/hr over 20 Minutes Intravenous Every 4 hours 06/11/24 0956     06/10/24 1345  linezolid  (ZYVOX ) IVPB 600  mg  Status:  Discontinued        600 mg 300 mL/hr over 60 Minutes Intravenous Every 12 hours 06/10/24 1259 06/11/24 0956   06/08/24 1400  DAPTOmycin  (CUBICIN ) IVPB 700  mg/150mL premix  Status:  Discontinued        700 mg 200 mL/hr over 30 Minutes Intravenous Daily 06/08/24 0747 06/10/24 1259   06/07/24 2000  meropenem  (MERREM ) 1 g in sodium chloride  0.9 % 100 mL IVPB        1 g 200 mL/hr over 30 Minutes Intravenous Every 8 hours 06/07/24 1132     06/07/24 1400  DAPTOmycin  (CUBICIN ) IVPB 500 mg/50mL premix  Status:  Discontinued        500 mg 100 mL/hr over 30 Minutes Intravenous Daily 06/06/24 2057 06/07/24 0832   06/07/24 1400  DAPTOmycin  (CUBICIN ) IVPB 700 mg/100mL premix  Status:  Discontinued        700 mg 200 mL/hr over 30 Minutes Intravenous Daily 06/07/24 0832 06/07/24 1015   06/07/24 1400  DAPTOmycin  (CUBICIN ) IVPB 700 mg/185mL premix        700 mg 200 mL/hr over 30 Minutes Intravenous Once 06/07/24 1130 06/07/24 1815   06/07/24 1115  piperacillin -tazobactam (ZOSYN ) IVPB 3.375 g  Status:  Discontinued        3.375 g 12.5 mL/hr over 240 Minutes Intravenous Every 8 hours 06/07/24 1015 06/07/24 1132   06/07/24 1000  cefTRIAXone  (ROCEPHIN ) 2 g in sodium chloride  0.9 % 100 mL IVPB  Status:  Discontinued        2 g 200 mL/hr over 30 Minutes Intravenous Every 24 hours 06/06/24 2042 06/07/24 0832   06/07/24 0930  ceFEPIme  (MAXIPIME ) 2 g in sodium chloride  0.9 % 100 mL IVPB  Status:  Discontinued        2 g 200 mL/hr over 30 Minutes Intravenous Every 8 hours 06/07/24 0831 06/07/24 1015   06/07/24 0915  piperacillin -tazobactam (ZOSYN ) IVPB 3.375 g  Status:  Discontinued        3.375 g 12.5 mL/hr over 240 Minutes Intravenous Every 8 hours 06/07/24 0829 06/07/24 0831   06/06/24 2300  atovaquone  (MEPRON ) 750 MG/5ML suspension 750 mg        750 mg Oral 2 times daily 06/06/24 2113     06/06/24 2200  DAPTOmycin  (CUBICIN ) IVPB 700 mg/100mL premix        700 mg 200 mL/hr over 30 Minutes Intravenous Once 06/06/24 2112 06/07/24 0036   06/06/24 1315  DAPTOmycin  (CUBICIN ) IVPB 700 mg/100mL premix  Status:  Discontinued        700 mg 200 mL/hr over 30 Minutes  Intravenous Once 06/06/24 1303 06/06/24 2112   06/06/24 1315  fidaxomicin  (DIFICID ) tablet 200 mg  Status:  Discontinued        200 mg Oral 2 times daily 06/06/24 1307 06/06/24 1312   06/06/24 1315  fidaxomicin  (DIFICID ) tablet 200 mg  Status:  Discontinued        200 mg Oral 2 times daily 06/06/24 1307 06/06/24 2107   06/06/24 1300  cefTRIAXone  (ROCEPHIN ) 2 g in sodium chloride  0.9 % 100 mL IVPB        2 g 200 mL/hr over 30 Minutes Intravenous  Once 06/06/24 1252 06/06/24 1408       Subjective: Today, patient was seen and examined at bedside.  Patient feels better today.  No epigastric discomfort nausea vomiting fever chills or rigor.   Objective: Vitals:   06/12/24 2123 06/13/24 0530  BP: 101/68  99/69  Pulse: 89 78  Resp: 18 19  Temp: 98.1 F (36.7 C) 98.2 F (36.8 C)  SpO2: 98% 97%    Intake/Output Summary (Last 24 hours) at 06/13/2024 1010 Last data filed at 06/12/2024 2300 Gross per 24 hour  Intake 840.97 ml  Output --  Net 840.97 ml   Filed Weights   06/06/24 1825  Weight: 71.8 kg   Body mass index is 26.34 kg/m.   Physical Exam: General:  Average built, not in obvious distress HENT:   No scleral pallor or icterus noted. Oral mucosa is moist.  Chest:  Clear breath sounds.  Diminished breath sounds bilaterally. No crackles or wheezes.  CVS: S1 &S2 heard. No murmur.  Regular rate and rhythm. Abdomen: Soft, nontender, nondistended.  Bowel sounds are heard.   Extremities: No cyanosis, clubbing or edema.  Peripheral pulses are palpable. Psych: Alert, awake and oriented, normal mood CNS:  No cranial nerve deficits.  Power equal in all extremities.   Skin: Warm and dry.  No rashes noted.  Data Review: I have personally reviewed the following laboratory data and studies,  CBC: Recent Labs  Lab 06/06/24 1112 06/07/24 0419 06/10/24 0504 06/11/24 0444 06/12/24 0538  WBC 17.5* 8.5 7.5 8.8 10.3  NEUTROABS  --   --  4.7 5.2 6.5  HGB 11.2* 9.0* 10.4* 10.4* 10.8*   HCT 33.7* 28.7* 32.6* 34.3* 35.6*  MCV 90.3 94.7 93.1 94.2 93.7  PLT 298 199 203 227 260   Basic Metabolic Panel: Recent Labs  Lab 06/06/24 1112 06/07/24 0419 06/10/24 0504 06/11/24 0444 06/12/24 0538  NA 136 142 142 140 139  K 3.5 3.3* 3.6 3.7 4.0  CL 96* 106 104 104 102  CO2 24 25 27 25 27   GLUCOSE 192* 102* 73 67* 76  BUN 15 11 10 12 9   CREATININE 0.80 0.49 0.51 0.55 0.64  CALCIUM 9.0 8.0* 8.7* 8.2* 8.6*   Liver Function Tests: Recent Labs  Lab 06/06/24 1112  AST 25  ALT 44  ALKPHOS 144*  BILITOT 0.7  PROT 7.1  ALBUMIN 3.3*   Recent Labs  Lab 06/06/24 1112  LIPASE 28   No results for input(s): AMMONIA in the last 168 hours. Cardiac Enzymes: Recent Labs  Lab 06/07/24 0419  CKTOTAL 26*   BNP (last 3 results) No results for input(s): BNP in the last 8760 hours.  ProBNP (last 3 results) No results for input(s): PROBNP in the last 8760 hours.  CBG: No results for input(s): GLUCAP in the last 168 hours. Recent Results (from the past 240 hours)  Blood culture (routine x 2)     Status: Abnormal   Collection Time: 06/06/24 11:12 AM   Specimen: BLOOD  Result Value Ref Range Status   Specimen Description   Final    BLOOD LEFT ANTECUBITAL Performed at [facility], [address] [place], [place] [address]    Special Requests   Final    BOTTLES DRAWN AEROBIC AND ANAEROBIC Blood Culture results may not be optimal due to an inadequate volume of blood received in culture bottles Performed at [facility], [address] [place], [place] [address]    Culture  Setup Time   Final    GRAM NEGATIVE RODS IN BOTH AEROBIC AND ANAEROBIC BOTTLES CRITICAL RESULT CALLED TO, READ BACK BY AND VERIFIED WITHBETHA RONAL HENCH PHARMD, AT 9188 06/07/24 D. VANHOOK CRITICAL VALUE NOTED.  VALUE IS CONSISTENT WITH PREVIOUSLY REPORTED AND CALLED VALUE.    Culture (A)  Final    PSEUDOMONAS AERUGINOSA SUSCEPTIBILITIES PERFORMED ON  PREVIOUS CULTURE WITHIN THE LAST 5 DAYS. Performed at [facility], [address] [place], [place] [address]    Report Status 06/09/2024 FINAL  Final  Blood culture (routine x 2)     Status: Abnormal   Collection Time: 06/06/24 12:03 PM   Specimen: BLOOD  Result Value Ref Range Status   Specimen Description   Final    BLOOD RIGHT ANTECUBITAL Performed at [facility], [address], [place], [place] [address]    Special Requests   Final    BOTTLES DRAWN AEROBIC AND ANAEROBIC Blood Culture adequate volume Performed at [facility], [address], [place], [place] [address]    Culture  Setup Time   Final    GRAM NEGATIVE RODS IN BOTH AEROBIC AND ANAEROBIC BOTTLES CRITICAL RESULT CALLED TO, READ BACK BY AND VERIFIED WITHBETHA RONAL HENCH PHARMD, AT 9188 06/07/24 D. VANHOOK Performed at [facility], [address], [place], [place] [address]    Culture PSEUDOMONAS AERUGINOSA (A)  Final   Report Status 06/09/2024 FINAL  Final   Organism ID, Bacteria PSEUDOMONAS AERUGINOSA  Final      Susceptibility   Pseudomonas aeruginosa - MIC*    MEROPENEM  1 SENSITIVE Sensitive     CIPROFLOXACIN 0.5 SENSITIVE Sensitive     IMIPENEM  2 SENSITIVE Sensitive     CEFEPIME  2 SENSITIVE Sensitive     CEFTAZIDIME/AVIBACTAM 2 SENSITIVE Sensitive ug/mL    CEFTOLOZANE/TAZOBACTAM 0.5 SENSITIVE Sensitive ug/mL    TOBRAMYCIN <=1 SENSITIVE Sensitive     CEFTAZIDIME 4 SENSITIVE Sensitive     * PSEUDOMONAS AERUGINOSA  Blood Culture ID Panel (Reflexed)     Status: Abnormal   Collection Time: 06/06/24 12:03 PM  Result Value Ref Range Status   Enterococcus faecalis NOT DETECTED NOT DETECTED Final   Enterococcus Faecium NOT DETECTED NOT DETECTED Final   Listeria monocytogenes NOT DETECTED NOT DETECTED Final   Staphylococcus species NOT DETECTED NOT DETECTED Final   Staphylococcus aureus (BCID) NOT DETECTED NOT DETECTED Final   Staphylococcus epidermidis NOT  DETECTED NOT DETECTED Final   Staphylococcus lugdunensis NOT DETECTED NOT DETECTED Final   Streptococcus species NOT DETECTED NOT DETECTED Final   Streptococcus agalactiae NOT DETECTED NOT DETECTED Final   Streptococcus pneumoniae NOT DETECTED NOT DETECTED Final   Streptococcus pyogenes NOT DETECTED NOT DETECTED Final   A.calcoaceticus-baumannii NOT DETECTED NOT DETECTED Final   Bacteroides fragilis NOT DETECTED NOT DETECTED Final   Enterobacterales NOT DETECTED NOT DETECTED Final   Enterobacter cloacae complex NOT DETECTED NOT DETECTED Final   Escherichia coli NOT DETECTED NOT DETECTED Final   Klebsiella aerogenes NOT DETECTED NOT DETECTED Final   Klebsiella oxytoca NOT DETECTED NOT DETECTED Final   Klebsiella pneumoniae NOT DETECTED NOT DETECTED Final   Proteus species NOT DETECTED NOT DETECTED Final   Salmonella species NOT DETECTED NOT DETECTED Final   Serratia marcescens NOT DETECTED NOT DETECTED Final   Haemophilus influenzae NOT DETECTED NOT DETECTED Final   Neisseria meningitidis NOT DETECTED NOT DETECTED Final   Pseudomonas aeruginosa DETECTED (A) NOT DETECTED Final    Comment: CRITICAL RESULT CALLED TO, READ BACK BY AND VERIFIED WITHBETHA RONAL HENCH PHARMD, AT 9188 06/07/24 D. VANHOOK    Stenotrophomonas maltophilia NOT DETECTED NOT DETECTED Final   Candida albicans NOT DETECTED NOT DETECTED Final   Candida auris NOT DETECTED NOT DETECTED Final   Candida glabrata NOT DETECTED NOT DETECTED Final   Candida krusei  NOT DETECTED NOT DETECTED Final   Candida parapsilosis NOT DETECTED NOT DETECTED Final   Candida tropicalis NOT DETECTED NOT DETECTED Final   Cryptococcus neoformans/gattii NOT DETECTED NOT DETECTED Final   CTX-M ESBL NOT DETECTED NOT DETECTED Final   Carbapenem resistance IMP NOT DETECTED NOT DETECTED Final   Carbapenem resistance KPC NOT DETECTED NOT DETECTED Final   Carbapenem resistance NDM NOT DETECTED NOT DETECTED Final   Carbapenem resistance VIM NOT DETECTED NOT  DETECTED Final    Comment: Performed at [facility], [address], [place], [place] [address]  Culture, blood (Routine X 2) w Reflex to ID Panel     Status: None (Preliminary result)   Collection Time: 06/09/24  5:02 AM   Specimen: BLOOD LEFT HAND  Result Value Ref Range Status   Specimen Description   Final    BLOOD LEFT HAND Performed at [facility], [address], [place], [place] [address]    Special Requests   Final    Blood Culture adequate volume BOTTLES DRAWN AEROBIC AND ANAEROBIC Performed at [facility], [address], [place], [place] [address]    Culture   Final    NO GROWTH 4 DAYS Performed at [facility], [address], [place], [place] [address]    Report Status PENDING  Incomplete  Culture, blood (Routine X 2) w Reflex to ID Panel     Status: None (Preliminary result)   Collection Time: 06/09/24  5:09 AM   Specimen: BLOOD LEFT HAND  Result Value Ref Range Status   Specimen Description   Final    BLOOD LEFT HAND Performed at [facility], [address] [place], [place] [address]    Special Requests   Final    Blood Culture results may not be optimal due to an inadequate volume of blood received in culture bottles BOTTLES DRAWN AEROBIC AND ANAEROBIC Performed at [facility], [address], [place], [place] [address]    Culture   Final    NO GROWTH 4 DAYS Performed at [facility], [address] [place], [place] [address]    Report Status PENDING  Incomplete     Studies: No results found.     Remmi Armenteros, MD  [facility] 06/13/2024  If 7PM-7AM, please contact night-coverage

## 2024-06-13 NOTE — Progress Notes (Addendum)
 [facility] Infectious Diseases Follow Up Note  Patient Identification: Patient Name: Laurie Terry MRN: [id number] Admit Date: 06/06/2024 11:00 AM Age: 58 y.o.Today's Date: 06/13/2024  Reason for Visit: Pseudomonas bacteremia, liver abscess, septic pulmonary emboli  Principal Problem:   Severe sepsis (HCC) Active Problems:   Bacteremia due to Pseudomonas   Normocytic anemia   PMR (polymyalgia rheumatica) (HCC)   Liver abscess   Temporal giant cell arteritis (HCC)   Acute bacterial endocarditis  Antibiotics:  Ampicillin  9/19- Meropenem  9/15- Total days of antibiotics 8  Atovaquone  ppx   Lines/Hardware:   Interval Events: Remains afebrile No labs today 9/17 blood cx NG in 4 days     Assessment 58 year old female with prior history with recently diagnosed giant cell arteritis with PMR on prednisone  and prophylactic atovaquone , h/o intra-abdominal abscess (subhepatic/perinephric, Cx with E faecalis and Candida albicans) following ERCP in 2023 treated with Augmentin  and fluconazole  and most recentl admission 8/29-9/7 with Klebsiella pneumonia bacteremia, Enterococcus hepatic abscess, osteitis pubis ( discharged on PO amoxicillin  and cefadroxil ) who presented to the ED on 8/14 with fatigue, lightheadedness for few days.   # PsA bacteremia with CT showing with concerns for multifocal cavitary pneumonia/septic emboli as well as hepatic abscesses - Hepatic abscesses appear to be small in size for IR drainage - 9/17 TEE with no vegetation, small PFO with left-to-right shunt, per cardiology Comments - likely GI source of recurrent infection   # Giant cell arteritis with PMR - Prednisone  dosing decreased to 15 mg daily - on prophylactic atovaquone     Recommendations - Plan for 6 weeks of IV ampicillin  + meropenem  from 9/17 - Needs double-lumen PICC - Monitor CBC and CMP - OPAT as below  - Universal/standard isolation  precaution D/w primary team ID will sign off, recall back with questions or concerns  OPAT Diagnosis: Pseudomonas bacteremia, liver abscess, septic pulmonary emboli  Culture Result: PsA, E faecium and Kleb pneumo  Allergies  Allergen Reactions   Cefepime  Cough    Patient develops cough, rash in her ears. 06/07/24 developed extreme itching with dose   Contrast Media [Iodinated Contrast Media] Shortness Of Breath and Other (See Comments)    Low blood pressure    Midodrine  Shortness Of Breath   Avocado Itching and Other (See Comments)    Tongue and mouth.    Fish Allergy Hives and Swelling   Levofloxacin Hives and Other (See Comments)    Pt already has aortic aneurysm.   Pantoprazole  Other (See Comments)    Joint aches, fever, peeling of skin   Quinolones Other (See Comments)    Contraindicated due to history of Aortic Aneurysm    Shellfish Allergy Hives and Swelling   Sulfa Antibiotics Hives   Vancomycin  Rash, Other (See Comments) and Cough    Patient develops cough, rash ears, tingling lips after receiving Cefepime  and vancomycin  01/23/2022 - Patient had an allergy test and the MD stated that it is not a real allergy and that it was just an infusion reaction    Augmentin  [Amoxicillin -Pot Clavulanate] Nausea And Vomiting    OPAT Orders Discharge antibiotics to be given via PICC line Discharge antibiotics: ampicillin  and meropenem  per pharmacy dosing  Per pharmacy protocol  Duration: 6 weeks  End Date: 10/29  [facility] Care Per Protocol:  Home health RN for IV administration and teaching; PICC line care and labs.    Labs weekly while on IV antibiotics: X__ CBC with differential __ BMP X__ CMP X__ CRP X__ ESR __ Vancomycin  trough  __ CK  __ Please pull PIC at completion of IV antibiotics X__ Please leave PIC in place until doctor has seen patient or been notified  Fax weekly labs to [phone]  Clinic Follow Up Appt: 9/24  @1  pm    Rest of the management as per  the primary team. Thank you for the consult. Please page with pertinent questions or concerns.  ______________________________________________________________________ Subjective patient seen and examined at the bedside.  Husband present.  Discussed plan to place PICC.  No other concerns.  Vitals BP 99/69 (BP Location: Right Arm)   Pulse 78   Temp 98.2 F (36.8 C) (Oral)   Resp 19   Ht 5' 5 (1.651 m)   Wt 71.8 kg   SpO2 97%   BMI 26.34 kg/m     Physical Exam Constitutional: Sitting in the recliner, comfortable    Comments HEENT WNL  Cardiovascular:     Rate and Rhythm: Normal rate and regular rhythm.     Heart sounds:   Pulmonary:     Effort: Pulmonary effort is normal.     Comments:   Abdominal:     Palpations:      Tenderness: Nondistended and nontender  Musculoskeletal:        General: No swelling or tenderness in peripheral joints.   Skin:    Comments: No rashes  Neurological:     General: Awake, alert and oriented, grossly nonfocal  Psychiatric:        Mood and Affect: Mood normal.   Pertinent Microbiology Results for orders placed or performed during the hospital encounter of 06/06/24  Blood culture (routine x 2)     Status: Abnormal   Collection Time: 06/06/24 11:12 AM   Specimen: BLOOD  Result Value Ref Range Status   Specimen Description   Final    BLOOD LEFT ANTECUBITAL Performed at [facility], [address], [place], [place] [address]    Special Requests   Final    BOTTLES DRAWN AEROBIC AND ANAEROBIC Blood Culture results may not be optimal due to an inadequate volume of blood received in culture bottles Performed at [facility], [address] [place], [place] [address]    Culture  Setup Time   Final    GRAM NEGATIVE RODS IN BOTH AEROBIC AND ANAEROBIC BOTTLES CRITICAL RESULT CALLED TO, READ BACK BY AND VERIFIED WITHBETHA RONAL HENCH PHARMD, AT 9188 06/07/24 D. VANHOOK CRITICAL VALUE NOTED.  VALUE IS  CONSISTENT WITH PREVIOUSLY REPORTED AND CALLED VALUE.    Culture (A)  Final    PSEUDOMONAS AERUGINOSA SUSCEPTIBILITIES PERFORMED ON PREVIOUS CULTURE WITHIN THE LAST 5 DAYS. Performed at [facility], [address], [place], [place] [address]    Report Status 06/09/2024 FINAL  Final  Blood culture (routine x 2)     Status: Abnormal   Collection Time: 06/06/24 12:03 PM   Specimen: BLOOD  Result Value Ref Range Status   Specimen Description   Final    BLOOD RIGHT ANTECUBITAL Performed at [facility], [address], [place], [place] [address]    Special Requests   Final    BOTTLES DRAWN AEROBIC AND ANAEROBIC Blood Culture adequate volume Performed at [facility], [address], [place], [place] [address]    Culture  Setup Time   Final    GRAM NEGATIVE RODS IN BOTH AEROBIC AND ANAEROBIC BOTTLES CRITICAL RESULT CALLED TO, READ BACK BY AND VERIFIED WITHBETHA RONAL HENCH PHARMD, AT 9188 06/07/24 D. VANHOOK  Performed at [facility], [address] [place], [place] [address]    Culture PSEUDOMONAS AERUGINOSA (A)  Final   Report Status 06/09/2024 FINAL  Final   Organism ID, Bacteria PSEUDOMONAS AERUGINOSA  Final      Susceptibility   Pseudomonas aeruginosa - MIC*    MEROPENEM  1 SENSITIVE Sensitive     CIPROFLOXACIN 0.5 SENSITIVE Sensitive     IMIPENEM  2 SENSITIVE Sensitive     CEFEPIME  2 SENSITIVE Sensitive     CEFTAZIDIME/AVIBACTAM 2 SENSITIVE Sensitive ug/mL    CEFTOLOZANE/TAZOBACTAM 0.5 SENSITIVE Sensitive ug/mL    TOBRAMYCIN <=1 SENSITIVE Sensitive     CEFTAZIDIME 4 SENSITIVE Sensitive     * PSEUDOMONAS AERUGINOSA  Blood Culture ID Panel (Reflexed)     Status: Abnormal   Collection Time: 06/06/24 12:03 PM  Result Value Ref Range Status   Enterococcus faecalis NOT DETECTED NOT DETECTED Final   Enterococcus Faecium NOT DETECTED NOT DETECTED Final   Listeria monocytogenes NOT DETECTED NOT DETECTED Final   Staphylococcus species  NOT DETECTED NOT DETECTED Final   Staphylococcus aureus (BCID) NOT DETECTED NOT DETECTED Final   Staphylococcus epidermidis NOT DETECTED NOT DETECTED Final   Staphylococcus lugdunensis NOT DETECTED NOT DETECTED Final   Streptococcus species NOT DETECTED NOT DETECTED Final   Streptococcus agalactiae NOT DETECTED NOT DETECTED Final   Streptococcus pneumoniae NOT DETECTED NOT DETECTED Final   Streptococcus pyogenes NOT DETECTED NOT DETECTED Final   A.calcoaceticus-baumannii NOT DETECTED NOT DETECTED Final   Bacteroides fragilis NOT DETECTED NOT DETECTED Final   Enterobacterales NOT DETECTED NOT DETECTED Final   Enterobacter cloacae complex NOT DETECTED NOT DETECTED Final   Escherichia coli NOT DETECTED NOT DETECTED Final   Klebsiella aerogenes NOT DETECTED NOT DETECTED Final   Klebsiella oxytoca NOT DETECTED NOT DETECTED Final   Klebsiella pneumoniae NOT DETECTED NOT DETECTED Final   Proteus species NOT DETECTED NOT DETECTED Final   Salmonella species NOT DETECTED NOT DETECTED Final   Serratia marcescens NOT DETECTED NOT DETECTED Final   Haemophilus influenzae NOT DETECTED NOT DETECTED Final   Neisseria meningitidis NOT DETECTED NOT DETECTED Final   Pseudomonas aeruginosa DETECTED (A) NOT DETECTED Final    Comment: CRITICAL RESULT CALLED TO, READ BACK BY AND VERIFIED WITHBETHA RONAL HENCH PHARMD, AT 9188 06/07/24 D. VANHOOK    Stenotrophomonas maltophilia NOT DETECTED NOT DETECTED Final   Candida albicans NOT DETECTED NOT DETECTED Final   Candida auris NOT DETECTED NOT DETECTED Final   Candida glabrata NOT DETECTED NOT DETECTED Final   Candida krusei NOT DETECTED NOT DETECTED Final   Candida parapsilosis NOT DETECTED NOT DETECTED Final   Candida tropicalis NOT DETECTED NOT DETECTED Final   Cryptococcus neoformans/gattii NOT DETECTED NOT DETECTED Final   CTX-M ESBL NOT DETECTED NOT DETECTED Final   Carbapenem resistance IMP NOT DETECTED NOT DETECTED Final   Carbapenem resistance KPC NOT  DETECTED NOT DETECTED Final   Carbapenem resistance NDM NOT DETECTED NOT DETECTED Final   Carbapenem resistance VIM NOT DETECTED NOT DETECTED Final    Comment: Performed at [facility], [address], [place], [place] [address]  Culture, blood (Routine X 2) w Reflex to ID Panel     Status: None (Preliminary result)   Collection Time: 06/09/24  5:02 AM   Specimen: BLOOD LEFT HAND  Result Value Ref Range Status   Specimen Description   Final    BLOOD LEFT HAND Performed at [facility], [address], [place], [place] [address]  Special Requests   Final    Blood Culture adequate volume BOTTLES DRAWN AEROBIC AND ANAEROBIC Performed at [facility], [address], [place], [place] [address]    Culture   Final    NO GROWTH 4 DAYS Performed at [facility], [address], [place], [place] [address]    Report Status PENDING  Incomplete  Culture, blood (Routine X 2) w Reflex to ID Panel     Status: None (Preliminary result)   Collection Time: 06/09/24  5:09 AM   Specimen: BLOOD LEFT HAND  Result Value Ref Range Status   Specimen Description   Final    BLOOD LEFT HAND Performed at [facility], [address] [place], [place] [address]    Special Requests   Final    Blood Culture results may not be optimal due to an inadequate volume of blood received in culture bottles BOTTLES DRAWN AEROBIC AND ANAEROBIC Performed at [facility], [address] [place], [place] [address]    Culture   Final    NO GROWTH 4 DAYS Performed at [facility], [address] [place], [place] [address]    Report Status PENDING  Incomplete    Pertinent Lab.    Latest Ref Rng & Units 06/12/2024    5:38 AM 06/11/2024    4:44 AM 06/10/2024    5:04 AM  CBC  WBC 4.0 - 10.5 K/uL 10.3  8.8  7.5   Hemoglobin 12.0 - 15.0 g/dL 89.1  89.5  89.5   Hematocrit 36.0 - 46.0 % 35.6  34.3  32.6   Platelets 150 - 400 K/uL 260  227  203        Latest Ref Rng & Units 06/12/2024    5:38 AM 06/11/2024    4:44 AM 06/10/2024    5:04 AM  CMP  Glucose 70 - 99 mg/dL 76  67  73   BUN 6 - 20 mg/dL 9  12  10    Creatinine 0.44 - 1.00 mg/dL 9.35  9.44  9.48   Sodium 135 - 145 mmol/L 139  140  142   Potassium 3.5 - 5.1 mmol/L 4.0  3.7  3.6   Chloride 98 - 111 mmol/L 102  104  104   CO2 22 - 32 mmol/L 27  25  27    Calcium 8.9 - 10.3 mg/dL 8.6  8.2  8.7      Pertinent Imaging today Plain films and CT images have been personally visualized and interpreted; radiology reports have been reviewed. Decision making incorporated into the Impression /   US  EKG SITE RITE Result Date: 06/13/2024 If Site Rite image not attached, placement could not be confirmed due to current cardiac rhythm.  I spent 50 minutes involved in face-to-face and non-face-to-face activities for this patient on the day of the visit. Professional time spent includes the following activities: Preparing to see the patient (review of tests), Obtaining and reviewing separately obtained history (hospitalist progress note), Performing a medically appropriate examination and evaluation , Ordering medications/labs, referring and communicating with other health care professionals including ID pharmacy, Documenting clinical information in the EMR, Independently interpreting results (not separately reported), Communicating results to the patient/spouse, Counseling and educating the patient/spouse and Care coordination (not separately reported).   Plan d/w requesting provider as well as ID pharm D  Of note, portions of this note may have been created with voice recognition software. While this note has been  edited for accuracy, occasional wrong-word or 'sound-a-like' substitutions may have occurred due to the inherent limitations of voice recognition software.   Electronically signed by:   Annalee Orem, MD Infectious Disease Physician [facility]  [facility] Pager: [phone]

## 2024-06-14 DIAGNOSIS — A419 Sepsis, unspecified organism: Secondary | ICD-10-CM | POA: Diagnosis not present

## 2024-06-14 DIAGNOSIS — R6520 Severe sepsis without septic shock: Secondary | ICD-10-CM | POA: Diagnosis not present

## 2024-06-14 LAB — CULTURE, BLOOD (ROUTINE X 2)
Culture: NO GROWTH
Culture: NO GROWTH
Special Requests: ADEQUATE

## 2024-06-14 MED ORDER — MEROPENEM IV (FOR PTA / DISCHARGE USE ONLY): 1.0000 g | Freq: Three times a day (TID) | INTRAVENOUS | 0 refills | Status: AC

## 2024-06-14 MED ORDER — SODIUM CHLORIDE 0.9% FLUSH: 10.0000 mL | INTRAVENOUS | Status: DC | PRN

## 2024-06-14 MED ORDER — PREDNISONE 50 MG PO TABS: ORAL_TABLET | ORAL | Status: DC

## 2024-06-14 MED ORDER — PREDNISONE 5 MG PO TABS: 15.0000 mg | ORAL_TABLET | Freq: Every day | ORAL | 0 refills | Status: AC

## 2024-06-14 MED ORDER — CHLORHEXIDINE GLUCONATE CLOTH 2 % EX PADS
6.0000 | MEDICATED_PAD | Freq: Every day | CUTANEOUS | Status: DC
  Administered 2024-06-14: 6 via TOPICAL

## 2024-06-14 MED ORDER — AMPICILLIN IV (FOR PTA / DISCHARGE USE ONLY): 12.0000 g | INTRAVENOUS | 0 refills | Status: AC

## 2024-06-14 NOTE — TOC Transition Note (Signed)
 Transition of Care [facility]) - Discharge Note   Patient Details  Name: Laurie Terry MRN: [id number] Date of Birth: [birth year]  Transition of Care [facility]) CM/SW Contact:  Bascom Service, RN Phone Number: 06/14/2024, 9:31 AM   Clinical Narrative: d/c home w/[employer] rep Pam-iv abx initial instruction;[facility] rep Amy for HHRN-further instruction;picc line flushes,labs per protocal.No further CM needs.      Final next level of care: Home w Home Health Services Barriers to Discharge: No Barriers Identified   Patient Goals and CMS Choice Patient states their goals for this hospitalization and ongoing recovery are:: Home CMS Medicare.gov Compare Post Acute Care list provided to:: Patient Choice offered to / list presented to : Patient [place] ownership interest in [facility].provided to:: Patient    Discharge Placement                       Discharge Plan and Services Additional resources added to the After Visit Summary for     Discharge Planning Services: CM Consult Post Acute Care Choice: Home Health                    HH Arranged: RN, IV Antibiotics HH Agency: [facility], [facility] Date [facility] Agency Contacted: 06/14/24 Time HH Agency Contacted: 0930 Representative spoke with at [facility] Agency: Amy;iv abx instruction/infusion  Social Drivers of Health (SDOH) Interventions SDOH Screenings   Food Insecurity: No Food Insecurity (06/06/2024)  Housing: Low Risk  (06/06/2024)  Transportation Needs: No Transportation Needs (06/06/2024)  Utilities: Not At Risk (06/06/2024)  Depression (PHQ2-9): Low Risk  (10/13/2023)  Social Connections: Moderately Integrated (05/22/2024)  Tobacco Use: Medium Risk (06/06/2024)     Readmission Risk Interventions    06/07/2024    2:25 PM  Readmission Risk Prevention Plan  Transportation Screening Complete  PCP or Specialist Appt within 3-5 Days Complete  HRI or Home Care Consult Complete  Social Work Consult for Recovery  Care Planning/Counseling Complete  Palliative Care Screening Not Applicable  Medication Review Oceanographer) Complete

## 2024-06-14 NOTE — Progress Notes (Signed)
 Pt d/c'd home w/ RUA PICC line intact per order.

## 2024-06-14 NOTE — Discharge Summary (Signed)
 Physician Discharge Summary  Laurie Terry FMW:[id number] DOB: [birth year] DOA: 06/06/2024  PCP: Laurie Harvey, MD  Admit date: 06/06/2024 Discharge date: 06/14/2024  Admitted From: Home  Discharge disposition: Home   Recommendations for Outpatient Follow-Up:   Follow up with your primary care provider in one week.  Check CBC, BMP, magnesium  in the next visit Follow-up with infectious disease clinic in the clinic as scheduled for infection follow-up. Follow-up with Laurie Terry rheumatology next week as has been scheduled.  Discuss about dose of prednisone  at that time.  Discharge Diagnosis:   Principal Problem:   Severe sepsis (HCC) Active Problems:   Bacteremia due to Pseudomonas   Normocytic anemia   PMR (polymyalgia rheumatica) (HCC)   Liver abscess   Temporal giant cell arteritis (HCC)   Acute bacterial endocarditis   Discharge Condition: Improved.  Diet recommendation:  Regular.  Wound care: None.  Code status: Full.   History of Present Illness:   Patient is a 58 years old female with past medical history of giant cell arteritis and PMR on prednisone , recent Klebsiella bacteremia and Enterococcus liver abscess, presented to the hospital on 06/06/2024 with lightheadedness and low blood pressure.  Of note patient was admitted between [phone] and 725 for acute thrombocytopenia which was felt secondary to infection and received 2 days of IVIG.  Further workup revealed that patient had Klebsiella bacteremia and liver abscess which grew Enterococcus faecalis.  ID was consulted and was initially treated with daptomycin  Rocephin  and Flagyl  and was discharged home on amoxicillin  and cefadroxil  for total of 4-week course.  On 06/02/2024, patient underwent EUS for evaluation of pneumobilia without any gallstones.  Subsequently patient started feeling lightheaded with some shortness of breath intermittent dry cough and went to GI clinic.  In the clinic, patient was noted to  be hypotensive and was sent to the ED for further evaluation and treatment.  In the ED patient had mild tachycardia, tachypnea and border pressure was 100/67.  Labs show WBC at 17.5 potassium low at 3.5.  Lactate was initially elevated to 3.8.  CT scan of the abdomen and pelvis showed several scattered lung nodules favoring multifocal cavitary pneumonia with scattered hypodense hepatic lesions.  Patient was then considered for admission to the hospital for further evaluation and treatment.    Hospital Course:   Following conditions were addressed during hospitalization as listed below,  Severe sepsis secondary to Pseudomonas bacteremia Septic emboli/multifocal cavitary pneumonia/ hepatic abscesses Recent Klebsiella bacteremia/Enterococcus liver abscess CT chest/abd/pelvis changes suspicious for pulmonary septic emboli as well as persistent hepatic lesions concerning for abscesses.   Patient underwent TEE on 06/09/2024 without any vegetation but small PFO.  Currently on ampicillin , meropenem .  Repeat blood cultures negative in 4 days.  ID has seen the patient and patient will undergo PICC line placement and total of 6 weeks of antibiotics on discharge.  End date of antibiotic 07/21/2024, patient will follow-up with Laurie Terry, ID on 06/16/2024.  Allergic reaction to cefepime  Antibiotics has been changed.   Giant cell arteritis/PMR Diagnosed by rheumatologist Laurie Terry with elevated inflammatory markers.  Patient was on prednisone  50 mg and atovaquone .  Previous provider had spoken with  Laurie Terry and, risk/benefit of ongoing prednisone  at high dose discussed, decision making conducted with patient, all concur that decrease prednisone  is in the patient's best interest.  Laurie Terry recommended dropping dose to 15 mg daily (PMR dose).  Will prescribe a 15 mg dose until patient sees Laurie Terry in the  next week.   Normocytic anemia Latest hemoglobin of 10.8.  Disposition.  At this time, patient is  stable for disposition home with outpatient PCP, ID and rheumatology follow-up  Medical Consultants:   None.  Procedures:    PICC line placement Subjective:   Today, patient was seen and examined at bedside.  Denies any nausea, vomiting, fever, chills or rigor.  Wants to go home.  Discharge Exam:   Vitals:   06/13/24 2037 06/14/24 0524  BP: 105/69 110/70  Pulse: 89 72  Resp: 18 16  Temp: 98.4 F (36.9 C) 98.2 F (36.8 C)  SpO2: 100% 98%   Vitals:   06/13/24 0530 06/13/24 1352 06/13/24 2037 06/14/24 0524  BP: 99/69 102/70 105/69 110/70  Pulse: 78 (!) 102 89 72  Resp: 19 20 18 16   Temp: 98.2 F (36.8 C) 98.5 F (36.9 C) 98.4 F (36.9 C) 98.2 F (36.8 C)  TempSrc: Oral Oral  Oral  SpO2: 97% 97% 100% 98%  Weight:      Height:        General: Alert awake, not in obvious distress HENT: pupils equally reacting to light,  No scleral pallor or icterus noted. Oral mucosa is moist.  Chest:  Clear breath sounds.  No crackles or wheezes.  CVS: S1 &S2 heard. No murmur.  Regular rate and rhythm. Abdomen: Soft, nontender, nondistended.  Bowel sounds are heard.   Extremities: No cyanosis, clubbing or edema.  Peripheral pulses are palpable. Right upper extremity PICC in place Psych: Alert, awake and oriented, normal mood CNS:  No cranial nerve deficits.  Moves all extremities Skin: Warm and dry.  No rashes noted.  The results of significant diagnostics from this hospitalization (including imaging, microbiology, ancillary and laboratory) are listed below for reference.     Diagnostic Studies:   CT CHEST ABDOMEN PELVIS WO CONTRAST Result Date: 06/06/2024 CLINICAL DATA:  Sepsis EXAM: CT CHEST, ABDOMEN AND PELVIS WITHOUT CONTRAST TECHNIQUE: Multidetector CT imaging of the chest, abdomen and pelvis was performed following the standard protocol without IV contrast. RADIATION DOSE REDUCTION: This exam was performed according to the departmental dose-optimization program which  includes automated exposure control, adjustment of the mA and/or kV according to patient size and/or use of iterative reconstruction technique. COMPARISON:  Multiple exams, including 05/07/2024 FINDINGS: CT CHEST FINDINGS Cardiovascular: Unremarkable Mediastinum/Nodes: Unremarkable Lungs/Pleura: Several scattered lung nodules are new compared to prior 05/07/2024 exam with the larger nodules being cavitary, favoring multifocal cavitary pneumonia or septic emboli. Index nodule anteriorly in the right upper lobe measures 1.4 by 1.0 cm on image 64 series 3. Mild atelectasis in both lower lobes. Musculoskeletal: Unremarkable CT ABDOMEN PELVIS FINDINGS Hepatobiliary: Several scattered hypodense hepatic lesions are observed including an index 1.9 by 1.7 cm lesion in the right hepatic lobe on image 51 series 2 which formerly measured 1.6 by 1.4 cm on the MRI from 05/26/2024. These lesions previously demonstrated mild rim enhancement concerning for abscesses. Pneumobilia is observed, as shown on 05/07/2024. Prior cholecystectomy. Pancreas: Unremarkable Spleen: Unremarkable Adrenals/Urinary Tract: Unremarkable Stomach/Bowel: Prominent stool throughout the colon favors constipation. Vascular/Lymphatic: Unremarkable Reproductive: Unremarkable Other: No supplemental non-categorized findings. Previous pelvic ascites has resolved. Musculoskeletal: Left eccentric sacral Tarlov cyst. IMPRESSION: 1. Several scattered lung nodules are new compared to prior 05/07/2024 exam with the larger nodules being cavitary, favoring multifocal cavitary pneumonia or septic emboli. 2. Several scattered hypodense hepatic lesions are observed including an index 1.9 by 1.7 cm lesion in the right hepatic lobe which formerly measured 1.6 by  1.4 cm on the MRI from 05/26/2024. These lesions previously demonstrated mild rim enhancement concerning for abscesses. 3. Prominent stool throughout the colon favors constipation. 4. Pneumobilia, as shown on 05/07/2024. 5.  Previous pelvic ascites has resolved. Electronically Signed   By: Ryan Salvage M.D.   On: 06/06/2024 14:13     Labs:   Basic Metabolic Panel: Recent Labs  Lab 06/10/24 0504 06/11/24 0444 06/12/24 0538  NA 142 140 139  K 3.6 3.7 4.0  CL 104 104 102  CO2 27 25 27   GLUCOSE 73 67* 76  BUN 10 12 9   CREATININE 0.51 0.55 0.64  CALCIUM 8.7* 8.2* 8.6*   GFR Estimated Creatinine Clearance: 75.2 mL/min (by C-G formula based on SCr of 0.64 mg/dL). Liver Function Tests: No results for input(s): AST, ALT, ALKPHOS, BILITOT, PROT, ALBUMIN in the last 168 hours. No results for input(s): LIPASE, AMYLASE in the last 168 hours. No results for input(s): AMMONIA in the last 168 hours. Coagulation profile No results for input(s): INR, PROTIME in the last 168 hours.  CBC: Recent Labs  Lab 06/10/24 0504 06/11/24 0444 06/12/24 0538  WBC 7.5 8.8 10.3  NEUTROABS 4.7 5.2 6.5  HGB 10.4* 10.4* 10.8*  HCT 32.6* 34.3* 35.6*  MCV 93.1 94.2 93.7  PLT 203 227 260   Cardiac Enzymes: No results for input(s): CKTOTAL, CKMB, CKMBINDEX, TROPONINI in the last 168 hours. BNP: Invalid input(s): POCBNP CBG: No results for input(s): GLUCAP in the last 168 hours. D-Dimer No results for input(s): DDIMER in the last 72 hours. Hgb A1c No results for input(s): HGBA1C in the last 72 hours. Lipid Profile No results for input(s): CHOL, HDL, LDLCALC, TRIG, CHOLHDL, LDLDIRECT in the last 72 hours. Thyroid  function studies No results for input(s): TSH, T4TOTAL, T3FREE, THYROIDAB in the last 72 hours.  Invalid input(s): FREET3 Anemia work up No results for input(s): VITAMINB12, FOLATE, FERRITIN, TIBC, IRON, RETICCTPCT in the last 72 hours. Microbiology Recent Results (from the past 240 hours)  Blood culture (routine x 2)     Status: Abnormal   Collection Time: 06/06/24 11:12 AM   Specimen: BLOOD  Result Value Ref Range Status    Specimen Description   Final    BLOOD LEFT ANTECUBITAL Performed at [facility], [address], [place], [place] [address]    Special Requests   Final    BOTTLES DRAWN AEROBIC AND ANAEROBIC Blood Culture results may not be optimal due to an inadequate volume of blood received in culture bottles Performed at [facility], [address], [place], [place] [address]    Culture  Setup Time   Final    GRAM NEGATIVE RODS IN BOTH AEROBIC AND ANAEROBIC BOTTLES CRITICAL RESULT CALLED TO, READ BACK BY AND VERIFIED WITHBETHA RONAL HENCH PHARMD, AT 9188 06/07/24 D. VANHOOK CRITICAL VALUE NOTED.  VALUE IS CONSISTENT WITH PREVIOUSLY REPORTED AND CALLED VALUE.    Culture (A)  Final    PSEUDOMONAS AERUGINOSA SUSCEPTIBILITIES PERFORMED ON PREVIOUS CULTURE WITHIN THE LAST 5 DAYS. Performed at [facility], [address], [place], [place] [address]    Report Status 06/09/2024 FINAL  Final  Blood culture (routine x 2)     Status: Abnormal   Collection Time: 06/06/24 12:03 PM   Specimen: BLOOD  Result Value Ref Range Status   Specimen Description   Final    BLOOD RIGHT ANTECUBITAL Performed at [facility], [address] [place], [place] [address]    Special Requests  Final    BOTTLES DRAWN AEROBIC AND ANAEROBIC Blood Culture adequate volume Performed at [facility], [address] [place], [place] [address]    Culture  Setup Time   Final    GRAM NEGATIVE RODS IN BOTH AEROBIC AND ANAEROBIC BOTTLES CRITICAL RESULT CALLED TO, READ BACK BY AND VERIFIED WITHBETHA RONAL HENCH PHARMD, AT 9188 06/07/24 D. VANHOOK Performed at [facility], [address], [place], [place] [address]    Culture PSEUDOMONAS AERUGINOSA (A)  Final   Report Status 06/09/2024 FINAL  Final   Organism ID, Bacteria PSEUDOMONAS AERUGINOSA  Final      Susceptibility   Pseudomonas aeruginosa - MIC*    MEROPENEM  1 SENSITIVE Sensitive      CIPROFLOXACIN 0.5 SENSITIVE Sensitive     IMIPENEM  2 SENSITIVE Sensitive     CEFEPIME  2 SENSITIVE Sensitive     CEFTAZIDIME/AVIBACTAM 2 SENSITIVE Sensitive ug/mL    CEFTOLOZANE/TAZOBACTAM 0.5 SENSITIVE Sensitive ug/mL    TOBRAMYCIN <=1 SENSITIVE Sensitive     CEFTAZIDIME 4 SENSITIVE Sensitive     * PSEUDOMONAS AERUGINOSA  Blood Culture ID Panel (Reflexed)     Status: Abnormal   Collection Time: 06/06/24 12:03 PM  Result Value Ref Range Status   Enterococcus faecalis NOT DETECTED NOT DETECTED Final   Enterococcus Faecium NOT DETECTED NOT DETECTED Final   Listeria monocytogenes NOT DETECTED NOT DETECTED Final   Staphylococcus species NOT DETECTED NOT DETECTED Final   Staphylococcus aureus (BCID) NOT DETECTED NOT DETECTED Final   Staphylococcus epidermidis NOT DETECTED NOT DETECTED Final   Staphylococcus lugdunensis NOT DETECTED NOT DETECTED Final   Streptococcus species NOT DETECTED NOT DETECTED Final   Streptococcus agalactiae NOT DETECTED NOT DETECTED Final   Streptococcus pneumoniae NOT DETECTED NOT DETECTED Final   Streptococcus pyogenes NOT DETECTED NOT DETECTED Final   A.calcoaceticus-baumannii NOT DETECTED NOT DETECTED Final   Bacteroides fragilis NOT DETECTED NOT DETECTED Final   Enterobacterales NOT DETECTED NOT DETECTED Final   Enterobacter cloacae complex NOT DETECTED NOT DETECTED Final   Escherichia coli NOT DETECTED NOT DETECTED Final   Klebsiella aerogenes NOT DETECTED NOT DETECTED Final   Klebsiella oxytoca NOT DETECTED NOT DETECTED Final   Klebsiella pneumoniae NOT DETECTED NOT DETECTED Final   Proteus species NOT DETECTED NOT DETECTED Final   Salmonella species NOT DETECTED NOT DETECTED Final   Serratia marcescens NOT DETECTED NOT DETECTED Final   Haemophilus influenzae NOT DETECTED NOT DETECTED Final   Neisseria meningitidis NOT DETECTED NOT DETECTED Final   Pseudomonas aeruginosa DETECTED (A) NOT DETECTED Final    Comment: CRITICAL RESULT CALLED TO, READ BACK BY  AND VERIFIED WITHBETHA RONAL HENCH PHARMD, AT 9188 06/07/24 D. VANHOOK    Stenotrophomonas maltophilia NOT DETECTED NOT DETECTED Final   Candida albicans NOT DETECTED NOT DETECTED Final   Candida auris NOT DETECTED NOT DETECTED Final   Candida glabrata NOT DETECTED NOT DETECTED Final   Candida krusei NOT DETECTED NOT DETECTED Final   Candida parapsilosis NOT DETECTED NOT DETECTED Final   Candida tropicalis NOT DETECTED NOT DETECTED Final   Cryptococcus neoformans/gattii NOT DETECTED NOT DETECTED Final   CTX-M ESBL NOT DETECTED NOT DETECTED Final   Carbapenem resistance IMP NOT DETECTED NOT DETECTED Final   Carbapenem resistance KPC NOT DETECTED NOT DETECTED Final   Carbapenem resistance NDM NOT DETECTED NOT DETECTED Final   Carbapenem resistance VIM NOT DETECTED NOT DETECTED Final    Comment: Performed at [facility], [address] [place], [place] [address]  Culture, blood (Routine X 2) w Reflex to ID Panel     Status: None   Collection Time: 06/09/24  5:02 AM   Specimen: BLOOD LEFT HAND  Result Value Ref Range Status   Specimen Description   Final    BLOOD LEFT HAND Performed at [facility], [address], [place], [place] [address]    Special Requests   Final    Blood Culture adequate volume BOTTLES DRAWN AEROBIC AND ANAEROBIC Performed at [facility], [address] [place], [place] [address]    Culture   Final    NO GROWTH 5 DAYS Performed at [facility], [address], [place], [place] [address]    Report Status 06/14/2024 FINAL  Final  Culture, blood (Routine X 2) w Reflex to ID Panel     Status: None   Collection Time: 06/09/24  5:09 AM   Specimen: BLOOD LEFT HAND  Result Value Ref Range Status   Specimen Description   Final    BLOOD LEFT HAND Performed at [facility], [address], [place], [place] [address]    Special Requests   Final    Blood Culture results may not be optimal due to an inadequate volume of blood  received in culture bottles BOTTLES DRAWN AEROBIC AND ANAEROBIC Performed at [facility], [address], [place], [place] [address]    Culture   Final    NO GROWTH 5 DAYS Performed at [facility], [address] [place], [place] [address]    Report Status 06/14/2024 FINAL  Final     Discharge Instructions:   Discharge Instructions     [facility] pharmacist to adjust dose for Vancomycin , Aminoglycosides and other anti-infective therapies as requested by physician.   Complete by: As directed    [facility] to provide Cath Flo 2mg    Complete by: As directed    Administer for PICC line occlusion and as ordered by physician for other access device issues.   Anaphylaxis Kit: Provided to treat any anaphylactic reaction to the medication being provided to the patient if First Dose or when requested by physician   Complete by: As directed    Epinephrine  1mg /ml vial / amp: Administer 0.3mg  (0.29ml) subcutaneously once for moderate to severe anaphylaxis, nurse to call physician and pharmacy when reaction occurs and call 911 if needed for immediate care   Diphenhydramine  50mg /ml IV vial: Administer 25-50mg  IV/IM PRN for first dose reaction, rash, itching, mild reaction, nurse to call physician and pharmacy when reaction occurs   Sodium Chloride  0.9% NS 500ml IV: Administer if needed for hypovolemic blood pressure drop or as ordered by physician after call to physician with anaphylactic reaction   Change dressing on IV access line weekly and PRN   Complete by: As directed    Diet - low sodium heart healthy   Complete by: As directed    Discharge instructions   Complete by: As directed    Follow up with your primary care provider in one week or so. Follow up with Dr Terry Infectious disease as has been scheduled. Follow up with Dr Terry, rheumatology and discuss about prednisone  in the scheduled visit.  Seek medical attention for worsening  symptoms.   Flush IV access with Sodium Chloride  0.9% and Heparin 10 units/ml or 100 units/ml   Complete by: As directed    Home infusion instructions - [facility]   Complete by:  As directed    Instructions: Flush IV access with Sodium Chloride  0.9% and Heparin 10units/ml or 100units/ml   Change dressing on IV access line: Weekly and PRN   Instructions Cath Flo 2mg : Administer for PICC Line occlusion and as ordered by physician for other access device   [facility] pharmacist to adjust dose for: Vancomycin , Aminoglycosides and other anti-infective therapies as requested by physician   Increase activity slowly   Complete by: As directed    Method of administration may be changed at the discretion of home infusion pharmacist based upon assessment of the patient and/or caregiver's ability to self-administer the medication ordered   Complete by: As directed    No wound care   Complete by: As directed       Allergies as of 06/14/2024       Reactions   Cefepime  Cough   Patient develops cough, rash in her ears. 06/07/24 developed extreme itching with dose   Contrast Media [iodinated Contrast Media] Shortness Of Breath, Other (See Comments)   Low blood pressure    Midodrine  Shortness Of Breath   Avocado Itching, Other (See Comments)   Tongue and mouth.    Fish Allergy Hives, Swelling   Levofloxacin Hives, Other (See Comments)   Pt already has aortic aneurysm.   Pantoprazole  Other (See Comments)   Joint aches, fever, peeling of skin   Quinolones Other (See Comments)   Contraindicated due to history of Aortic Aneurysm    Shellfish Allergy Hives, Swelling   Sulfa Antibiotics Hives   Vancomycin  Rash, Other (See Comments), Cough   Patient develops cough, rash ears, tingling lips after receiving Cefepime  and vancomycin  01/23/2022 - Patient had an allergy test and the MD stated that it is not a real allergy and that it was just an infusion reaction    Augmentin   [amoxicillin -pot Clavulanate] Nausea And Vomiting        Medication List     STOP taking these medications    amoxicillin  500 MG capsule Commonly known as: AMOXIL    cefadroxil  500 MG capsule Commonly known as: DURICEF   nystatin  100000 UNIT/ML suspension Commonly known as: MYCOSTATIN    polyethylene glycol 17 g packet Commonly known as: MIRALAX  / GLYCOLAX        TAKE these medications    acetaminophen  500 MG tablet Commonly known as: TYLENOL  Take 500 mg by mouth every 6 (six) hours as needed.   Adapalene 0.3 % gel Apply 1 application  topically daily as needed (breakout).   alum & mag hydroxide-simeth 400-400-40 MG/5ML suspension Commonly known as: MAALOX PLUS Take 15 mLs by mouth every 6 (six) hours as needed for indigestion.   ampicillin  IVPB Inject 12 g into the vein daily. Indication:  polymicrobial infection - liver abscess, pulmonary cavitations First Dose: Yes Last Day of Therapy:  07/21/2024 Labs - Once weekly:  CBC/D and BMP, Labs - Once weekly: ESR and CRP Method of administration: Ambulatory Pump (Continuous Infusion) Method of administration may be changed at the discretion of home infusion pharmacist based upon assessment of the patient and/or caregiver's ability to self-administer the medication ordered.   atovaquone  750 MG/5ML suspension Commonly known as: MEPRON  Take 750 mg by mouth 2 (two) times daily.   Colestid  1 g tablet Generic drug: colestipol  Take 1-2 g by mouth 2 (two) times daily. 1 TO 2 TABLETS BY MOUTH NOT WITHIN AN HOUR OF OTHER MEDICINES TWICE DAILY.   diclofenac  Sodium 1 % Gel Commonly known as: VOLTAREN  Apply 2 g topically  4 (four) times daily. What changed:  when to take this reasons to take this   dicyclomine  20 MG tablet Commonly known as: BENTYL  Take 1 tablet (20 mg total) by mouth 2 (two) times daily. What changed:  when to take this reasons to take this   EPINEPHrine  0.3 mg/0.3 mL Soaj injection Commonly known  as: EPI-PEN Inject 0.3 mg into the muscle as needed for anaphylaxis.   famotidine  40 MG tablet Commonly known as: PEPCID  Take 40 mg by mouth 2 (two) times daily.   fluticasone  50 MCG/ACT nasal spray Commonly known as: FLONASE  Place 2 sprays into both nostrils daily. What changed:  when to take this reasons to take this   loperamide  2 MG capsule Commonly known as: IMODIUM  Take 1 capsule (2 mg total) by mouth every 4 (four) hours as needed for diarrhea or loose stools.   meropenem  IVPB Commonly known as: MERREM  Inject 1 g into the vein every 8 (eight) hours. Indication: polymicrobial infection - liver abscess, pulmonary cavitations First Dose: Yes Last Day of Therapy:  07/21/2024 Labs - Once weekly:  CBC/D and BMP, Labs - Once weekly: ESR and CRP Method of administration: Mini-Bag Plus / Gravity Method of administration may be changed at the discretion of home infusion pharmacist based upon assessment of the patient and/or caregiver's ability to self-administer the medication ordered.   predniSONE  5 MG tablet Commonly known as: DELTASONE  Take 3 tablets (15 mg total) by mouth daily with breakfast for 10 days. Start taking on: June 15, 2024 What changed:  medication strength how much to take how to take this when to take this additional instructions   predniSONE  50 MG tablet Commonly known as: DELTASONE  1 PO daily-- follow up with rheum/PCP For titration down Start taking on: June 24, 2024 What changed: You were already taking a medication with the same name, and this prescription was added. Make sure you understand how and when to take each.   saccharomyces boulardii 250 MG capsule Commonly known as: FLORASTOR Take 1 capsule (250 mg total) by mouth 2 (two) times daily.               Discharge Care Instructions  (From admission, onward)           Start     Ordered   06/14/24 0000  Change dressing on IV access line weekly and PRN  (Home infusion  instructions - [facility] )        06/14/24 0828            Follow-up Information     [facility] Follow up.   Why: [facility] Contact information: [address] [place] [address] [phone]         [facility] Follow up.   WhyBETHA Reesa Lango [id number] [phone]        Laurie Harvey, MD Follow up in 1 week(s).   Specialty: Family Medicine Contact information: [address] [place] [address] [phone]         Terry Kingsley, MD Follow up on 06/16/2024.   Specialty: Infectious Diseases Contact information: [address], [address] [place] [address] [phone]                  Time coordinating discharge: 39 minutes  Signed:  Ariyan Brisendine  [facility] 06/14/2024, 1:10 PM

## 2024-06-14 NOTE — Progress Notes (Signed)
 Peripherally Inserted Central Catheter Placement  The IV Nurse has discussed with the patient and/or persons authorized to consent for the patient, the purpose of this procedure and the potential benefits and risks involved with this procedure.  The benefits include less needle sticks, lab draws from the catheter, and the patient may be discharged home with the catheter. Risks include, but not limited to, infection, bleeding, blood clot (thrombus formation), and puncture of an artery; nerve damage and irregular heartbeat and possibility to perform a PICC exchange if needed/ordered by physician.  Alternatives to this procedure were also discussed.  Bard Power PICC patient education guide, fact sheet on infection prevention and patient information card has been provided to patient /or left at bedside.  PICC inserted by Maryalice Nephew, RN  PICC Placement Documentation  PICC Double Lumen 06/14/24 Right Basilic 38 cm 0 cm (Active)  Indication for Insertion or Continuance of Line Prolonged intravenous therapies 06/14/24 1100  Exposed Catheter (cm) 0 cm 06/14/24 1100  Site Assessment Clean, Dry, Intact 06/14/24 1100  Lumen #1 Status Flushed;Saline locked;Blood return noted 06/14/24 1100  Lumen #2 Status Flushed;Saline locked;Blood return noted 06/14/24 1100  Dressing Type Transparent;Securing device 06/14/24 1100  Dressing Status Antimicrobial disc/dressing in place;Clean, Dry, Intact 06/14/24 1100  Line Care Connections checked and tightened 06/14/24 1100  Line Adjustment (NICU/IV Team Only) No 06/14/24 1100  Dressing Intervention New dressing;Adhesive placed at insertion site (IV team only) 06/14/24 1100  Dressing Change Due 06/21/24 06/14/24 1100       [name] 06/14/2024, 11:01 AM

## 2024-06-16 ENCOUNTER — Other Ambulatory Visit: Payer: Self-pay

## 2024-06-16 ENCOUNTER — Ambulatory Visit (INDEPENDENT_AMBULATORY_CARE_PROVIDER_SITE_OTHER): Admitting: Internal Medicine

## 2024-06-16 VITALS — BP 101/70 | HR 95 | Temp 98.4°F | Resp 16

## 2024-06-16 DIAGNOSIS — K750 Abscess of liver: Secondary | ICD-10-CM

## 2024-06-16 NOTE — Progress Notes (Signed)
 Patient: Laurie Terry  DOB: Jun 17, 1966 MRN: [id number] PCP: [name], MD   Patient Active Problem List   Diagnosis Date Noted   Acute bacterial endocarditis 06/09/2024   Liver abscess 06/08/2024   Temporal giant cell arteritis (HCC) 06/08/2024   Severe sepsis (HCC) 06/06/2024   PMR (polymyalgia rheumatica) 06/06/2024   Pubic bone pain 05/27/2024   Sinus bradycardia 05/26/2024   Bacteremia due to Pseudomonas 05/24/2024   Normocytic anemia 05/24/2024   Dilatation of aorta 01/24/2023   Perinephric abscess 03/03/2022   Norovirus 02/15/2022   Dry cough 02/02/2022   Iatrogenic subdiaphragmatic abscess (HCC) 02/02/2022   Pleural effusion on right    Post-ERCP acute pancreatitis 01/16/2022   GERD without esophagitis 01/16/2022   Choledocholithiasis 01/16/2022   Adverse effect of other drugs, medicaments and biological substances, subsequent encounter 05/23/2021   Other allergic rhinitis 05/23/2021   Allergic conjunctivitis of both eyes 05/23/2021   History of asthma 05/23/2021   Oral allergy syndrome, subsequent encounter 05/23/2021   Family history of bicuspid aortic valve 11/28/2020   Other hyperlipidemia 11/28/2020   PAC (premature atrial contraction) 10/30/2020   Postoperative bile leak 04/12/2016   Acute hypokalemia 04/12/2016   Paroxysmal SVT (supraventricular tachycardia) 04/12/2016   Bile leak, postoperative 04/12/2016   S/P laparoscopic cholecystectomy    History of Gallstone pancreatitis 04/07/2016   Pancreatitis due to biliary obstruction 04/07/2016   Cancer (HCC)    Osteopenia      Subjective:  Laurie Terry is a 58 year old female with past medical history of giant cell arteritis with PMR on prednisone  started on prophylactic atovaquone , history of intra-abdominal abscesses subhepatic/perinephric cultures growing E faecalis Candida albicans following ERCP in 2023 she was treated with Augmentin  and fluconazole , recent admission 8/27 - 9/9 with  Klebsiella pneumonia bacteremia suspected secondary to GI source found to have hepatic abscess status post aspiration growing E faecium, otitis pubis discharged on amoxicillin  and cefadroxil  presents for hospital follow-up of Pseudomonas bacteremia.  She was has been 9/14 - 9/22 after presented from GI clinic with light headedness and low blood pressure.  Blood cultures grew Pseudomonas.  CT showed concern for multifocal cavitary pneumonia/septic emboli as well as hepatic abscesses.  TEE was without vegetation.  Small PFO with left-to-right shunt noted.  Hepatic abscesses appear too small in size for IR to drain at that time.  She was discharged on meropenem  and ampicillin  x 6 weeks from 9/17 EOT 10/29  Review of Systems  All other systems reviewed and are negative.   Past Medical History:  Diagnosis Date   Allergic rhinitis    Aortic aneurysm 09/23/2020   Cancer (HCC)    Melanoma   Cervical dysplasia 2007   low-grade SIL, normal Pap smears since then   Family history of adverse reaction to anesthesia    mom has h/o post op nausea/vomiting   Intermittent palpitations    IUD    inserted 03/2002.04/06/2007, 06/03/2012   Ocular migraine    Osteomalacia    Pancreatitis    Seafood allergy     Outpatient Medications Prior to Visit  Medication Sig Dispense Refill   acetaminophen  (TYLENOL ) 500 MG tablet Take 500 mg by mouth every 6 (six) hours as needed.     Adapalene 0.3 % gel Apply 1 application  topically daily as needed (breakout).     alum & mag hydroxide-simeth (MAALOX PLUS) 400-400-40 MG/5ML suspension Take 15 mLs by mouth every 6 (six) hours as needed for indigestion. 355 mL 0  ampicillin  IVPB Inject 12 g into the vein daily. Indication:  polymicrobial infection - liver abscess, pulmonary cavitations First Dose: Yes Last Day of Therapy:  07/21/2024 Labs - Once weekly:  CBC/D and BMP, Labs - Once weekly: ESR and CRP Method of administration: Ambulatory Pump (Continuous  Infusion) Method of administration may be changed at the discretion of home infusion pharmacist based upon assessment of the patient and/or caregiver's ability to self-administer the medication ordered. 40 Units 0   atovaquone  (MEPRON ) 750 MG/5ML suspension Take 750 mg by mouth 2 (two) times daily.     COLESTID  1 g tablet Take 1-2 g by mouth 2 (two) times daily. 1 TO 2 TABLETS BY MOUTH NOT WITHIN AN HOUR OF OTHER MEDICINES TWICE DAILY.     diclofenac  Sodium (VOLTAREN ) 1 % GEL Apply 2 g topically 4 (four) times daily. (Patient taking differently: Apply 2 g topically 2 (two) times daily as needed (arthritis pain).)     dicyclomine  (BENTYL ) 20 MG tablet Take 1 tablet (20 mg total) by mouth 2 (two) times daily. (Patient taking differently: Take 20 mg by mouth 2 (two) times daily as needed.) 20 tablet 0   EPINEPHrine  0.3 mg/0.3 mL IJ SOAJ injection Inject 0.3 mg into the muscle as needed for anaphylaxis. 2 each 1   famotidine  (PEPCID ) 40 MG tablet Take 40 mg by mouth 2 (two) times daily.     fluticasone  (FLONASE ) 50 MCG/ACT nasal spray Place 2 sprays into both nostrils daily. (Patient taking differently: Place 2 sprays into both nostrils daily as needed.) 16 g 5   loperamide  (IMODIUM ) 2 MG capsule Take 1 capsule (2 mg total) by mouth every 4 (four) hours as needed for diarrhea or loose stools.     meropenem  (MERREM ) IVPB Inject 1 g into the vein every 8 (eight) hours. Indication: polymicrobial infection - liver abscess, pulmonary cavitations First Dose: Yes Last Day of Therapy:  07/21/2024 Labs - Once weekly:  CBC/D and BMP, Labs - Once weekly: ESR and CRP Method of administration: Mini-Bag Plus / Gravity Method of administration may be changed at the discretion of home infusion pharmacist based upon assessment of the patient and/or caregiver's ability to self-administer the medication ordered. 120 Units 0   predniSONE  (DELTASONE ) 5 MG tablet Take 3 tablets (15 mg total) by mouth daily with breakfast for  10 days. 30 tablet 0   [START ON 06/24/2024] predniSONE  (DELTASONE ) 50 MG tablet 1 PO daily-- follow up with rheum/PCP For titration down     saccharomyces boulardii (FLORASTOR) 250 MG capsule Take 1 capsule (250 mg total) by mouth 2 (two) times daily. 60 capsule 0   No facility-administered medications prior to visit.     Allergies  Allergen Reactions   Cefepime  Cough    Patient develops cough, rash in her ears. 06/07/24 developed extreme itching with dose   Contrast Media [Iodinated Contrast Media] Shortness Of Breath and Other (See Comments)    Low blood pressure    Midodrine  Shortness Of Breath   Avocado Itching and Other (See Comments)    Tongue and mouth.    Fish Allergy Hives and Swelling   Levofloxacin Hives and Other (See Comments)    Pt already has aortic aneurysm.   Pantoprazole  Other (See Comments)    Joint aches, fever, peeling of skin   Quinolones Other (See Comments)    Contraindicated due to history of Aortic Aneurysm    Shellfish Allergy Hives and Swelling   Sulfa Antibiotics Hives   Vancomycin  Rash,  Other (See Comments) and Cough    Patient develops cough, rash ears, tingling lips after receiving Cefepime  and vancomycin  01/23/2022 - Patient had an allergy test and the MD stated that it is not a real allergy and that it was just an infusion reaction    Augmentin  [Amoxicillin -Pot Clavulanate] Nausea And Vomiting    Social History   Tobacco Use   Smoking status: Former    Passive exposure: Past   Smokeless tobacco: Never  Vaping Use   Vaping status: Never Used  Substance Use Topics   Alcohol use: Yes   Drug use: No    Family History  Problem Relation Age of Onset   Tremor Mother    Stroke Father    Autoimmune disease Father    CVA Father    Breast cancer Sister 25       3X   Arthritis Sister        Psoriatic   Thyroid  disease Sister    Breast cancer Maternal Grandmother        Age 31   Thyroid  disease Daughter        Hypo   Uterine cancer Neg Hx     Bladder Cancer Neg Hx    Renal cancer Neg Hx     Objective:  There were no vitals filed for this visit. There is no height or weight on file to calculate BMI.  Physical Exam Constitutional:      Appearance: Normal appearance.  HENT:     Head: Normocephalic and atraumatic.     Right Ear: Tympanic membrane normal.     Left Ear: Tympanic membrane normal.     Nose: Nose normal.     Mouth/Throat:     Mouth: Mucous membranes are moist.  Eyes:     Extraocular Movements: Extraocular movements intact.     Conjunctiva/sclera: Conjunctivae normal.     Pupils: Pupils are equal, round, and reactive to light.  Cardiovascular:     Rate and Rhythm: Normal rate and regular rhythm.     Heart sounds: No murmur heard.    No friction rub. No gallop.  Pulmonary:     Effort: Pulmonary effort is normal.     Breath sounds: Normal breath sounds.  Abdominal:     General: Abdomen is flat.     Palpations: Abdomen is soft.  Musculoskeletal:        General: Normal range of motion.  Skin:    General: Skin is warm and dry.  Neurological:     General: No focal deficit present.     Mental Status: She is alert and oriented to person, place, and time.  Psychiatric:        Mood and Affect: Mood normal.     Lab Results: Lab Results  Component Value Date   WBC 10.3 06/12/2024   HGB 10.8 (L) 06/12/2024   HCT 35.6 (L) 06/12/2024   MCV 93.7 06/12/2024   PLT 260 06/12/2024    Lab Results  Component Value Date   CREATININE 0.64 06/12/2024   BUN 9 06/12/2024   NA 139 06/12/2024   K 4.0 06/12/2024   CL 102 06/12/2024   CO2 27 06/12/2024    Lab Results  Component Value Date   ALT 44 06/06/2024   AST 25 06/06/2024   ALKPHOS 144 (H) 06/06/2024   BILITOT 0.7 06/06/2024     Assessment & Plan:   #Pseudomonas bacteremia secondary to intra-abdominal source #History of Klebsiella bacteremia and E faecium hepatic abscesses secondary  to intra-abdominal source - Patient admitted initially admitted  8/27 - 9/9 for Klebsiella bacteremia suspect secondary GI source.  She was found to have hepatic abscesses, underwent IR aspiration with cultures growing E faecium and she was discharged Amoxident cefadroxil  - She returned from GI clinic due to low blood pressure on 9/14 found to have Pseudomonas bacteremia - CT chest abdomen pelvis showed several scattered lung nodules which were new compared to CT on 8/15.  Several scattered hypodense hepatic lesions observed, noted to have increased in size from 1.6 X1 0.4-1.9 X1 0.7 cm compared to previous MRI 9/3 previous pelvic ascites resolved - TEE without vegetation, small PFO - She was discharged on 9/22 on meropenem  ampicillin  x 6 weeks EOT 10/29  #picc ine -recenly discharged -No drains - pain resolved. Tolerating abx. Follow labs  #cefepime   -allergy on 06/07/24-> rash on legs, face erythema per pt   Loney Stank, MD [facility]   06/16/24  9:47 AM I have personally spent 52 minutes involved in face-to-face and non-face-to-face activities for this patient on the day of the visit. Professional time spent includes the following activities: Preparing to see the patient (review of tests), Obtaining and/or reviewing separately obtained history (admission/discharge record), Performing a medically appropriate examination and/or evaluation , Ordering medications/tests/procedures, referring and communicating with other health care professionals, Documenting clinical information in the EMR, Independently interpreting results (not separately reported), Communicating results to the patient/family/caregiver, Counseling and educating the patient/family/caregiver and Care coordination (not separately reported).

## 2024-06-17 ENCOUNTER — Other Ambulatory Visit

## 2024-06-22 ENCOUNTER — Telehealth: Payer: Self-pay

## 2024-06-22 NOTE — Telephone Encounter (Signed)
 Received voicemail from Jenna, RN with [facility] requesting call back.   Spoke with Jenna, she states that the Statlock that was provided with patient's dressing kit does not fit. Advised her to reach out to [facility] since they are responsible for supplies. Jenna reports there is not currently a securement device, only the transparent dressing.   Message sent to Holley Herring, RN with [facility] to follow up.   Jenna [phone]  Cosandra Plouffe, BSN, RN

## 2024-06-22 NOTE — Telephone Encounter (Signed)
 Pam is reaching out to [facility], Shasta Cahill to further troubleshoot.  Mora Pedraza, BSN, RN

## 2024-06-25 ENCOUNTER — Other Ambulatory Visit (HOSPITAL_COMMUNITY): Payer: Self-pay | Admitting: Family Medicine

## 2024-06-25 DIAGNOSIS — G4452 New daily persistent headache (NDPH): Secondary | ICD-10-CM

## 2024-06-26 ENCOUNTER — Ambulatory Visit (HOSPITAL_COMMUNITY)
Admission: RE | Admit: 2024-06-26 | Discharge: 2024-06-26 | Disposition: A | Source: Ambulatory Visit | Attending: Family Medicine | Admitting: Family Medicine

## 2024-06-26 DIAGNOSIS — G4452 New daily persistent headache (NDPH): Secondary | ICD-10-CM | POA: Diagnosis present

## 2024-06-26 MED ORDER — GADOBUTROL 1 MMOL/ML IV SOLN
7.0000 mL | Freq: Once | INTRAVENOUS | Status: AC | PRN
  Administered 2024-06-26: 7 mL via INTRAVENOUS

## 2024-06-28 ENCOUNTER — Telehealth: Payer: Self-pay

## 2024-06-28 NOTE — Telephone Encounter (Signed)
 Voicemail from [facility] wanted to confirm office is receiving labs needs from last week. Nurse can be reached at  [phone].  Routing to Pharmacy to confirm las are being received.   Enis Kleine, LPN

## 2024-06-29 LAB — FUNGAL ORGANISM REFLEX

## 2024-06-29 LAB — ACID FAST SMEAR (AFB, MYCOBACTERIA)

## 2024-06-29 LAB — FUNGUS CULTURE WITH STAIN

## 2024-06-29 LAB — FUNGUS CULTURE RESULT

## 2024-06-29 NOTE — Telephone Encounter (Signed)
 We did receive labs from 06/22/24. No remarkable or significant labs. Will continue monitoring - thank you!

## 2024-06-30 ENCOUNTER — Encounter: Payer: Self-pay | Admitting: Internal Medicine

## 2024-06-30 ENCOUNTER — Telehealth: Payer: Self-pay

## 2024-06-30 NOTE — Telephone Encounter (Signed)
 No, I only received the 9/30 labs and then just received the 10/6 labs today. Thanks for the clarification with them though.

## 2024-06-30 NOTE — Telephone Encounter (Signed)
 Received call from Jenna with [facility]. She states for some reason they have orders that they were supposed to go out to the patient's home a second time last week for labs. She needs verbal orders to cancel this.   She wanted to make sure our office received last week's labs. Notified her that pharmacy team reviewed labs from 9/30  She spoke with [facility] this morning and requested that they fax labs to our office. She requests that pharmacy team review and give her a call if they need anything additional [phone]).  Elma Limas, BSN, RN

## 2024-06-30 NOTE — Telephone Encounter (Signed)
 Spoke with Jenna, notified her that pharmacy did not need additional labs last week and okay to cancel that order for second draw. Relayed that pharmacy team did receive 10/6 labs.   Nykiah Ma, BSN, RN

## 2024-07-01 NOTE — Telephone Encounter (Signed)
 HH nurse made

## 2024-07-09 ENCOUNTER — Telehealth: Payer: Self-pay | Admitting: Internal Medicine

## 2024-07-09 DIAGNOSIS — K750 Abscess of liver: Secondary | ICD-10-CM

## 2024-07-09 NOTE — Telephone Encounter (Signed)
 Spoke to pt,Loose stool is on and off since discharge from hospital.she has had negative c diff testing in the interim(GI office). Imodium  ok, continue probiotics(she had not taken of a day).  She denies any abdominal pain. If worsening symptoms then go to ed. I will place ct orders in for repeat testing given history of abscess. No change in abx plan. Informed pt that we do not have 10/13 lab results yet.

## 2024-07-10 NOTE — Progress Notes (Unsigned)
 [facility]  PATIENT: Laurie Terry DOB: 13-Apr-1966  REFERRING DOCTOR OR PCP: Jon Jacob MD; Sari Pay, MD SOURCE: Patient, notes from Dr. Jacob, notes from hospitalization, imaging and lab reports, MRI images personally reviewed  _________________________________   HISTORICAL  CHIEF COMPLAINT:  Chief Complaint  Patient presents with   New Patient (Initial Visit)    Pt in room 10. Alone. Paper referral for headaches.    HISTORY OF PRESENT ILLNESS:  I had the pleasure of seeing your patient, Laurie Terry, at [facility] for neurologic consultation regarding her headaches and possible giant cell arteritis   She is a 58 year who started experiencing pain in the right temple near the eye in June 2025.   She saw her primary care provider and had a single steroid shot without benefit.   Headaches persisted daily with an ice pick quality and often awakening her at night.   Pain was very localized to the right temple near the eye.   She was not experiencing visual changes.   No N/V.   She did not notice jaw claudication.   The headaches persisted and then she began to experience total body pain by July 2025.  Based on clinical presentation, she was diagnosed with polymyalgia rheumatica.  ESR was mildly elevated at 22.  She was started on low dose steroid.  She saw Rheumatology and the prednisone  was increased to 60 mg daily.   Pain improved shortly after starting prednisone  20 mg.  The body pain completely resolved.  She continued to experience some of the right sided headache though the intensity was improved.  Further increase to 60 mg dd not help the headache further.      She continues on the 60 mg of prednisone .  She began to feel tired in mid August 2025.  She was admitted to the hospital 05/21/2024 with thrombocytopenia (23,000).  She also had fever and was reporting severe lethargy and noted to have hypotension.  Blood work at her  primary care provider showed thrombocytopenia and she was sent to the emergency room.  In the hospital she was found to have bacteremia due to Klebsiella pneumoniae.  She also had changes in the liver concerning for infection.  She was placed on antibiotics.  She also has had osteitis pubis helped by anti-inflammatories.  Headache in the hospital was improved with IV Toradol .  While in hospital, she did have a temporal artery ultrasound which was normal (had been on a couple months of high-dose steroids prior to the ultrasound) there was some discussion about whether her headaches were due to temporal arteritis or migraine/other.  Currently, due to her infections, prednisone  is being tapered and currently she is on 12.5 mg daily.    Over the last couple weeks, she has noted a very low level intensity HA in the right temple -same location but less intensity than the headache she had in June.    She only woke up once with a HA   She has some insomnia in general but has slept more since the fatigue associated with her infection.   She has had some LBP.  She was placed on tizanidine but had hypotension on her frst dose and stopped.     She has a h/o cluster headache many years. She gets classic migraine once or twice a year.    She did experience 2-3 of these right as the sepsis began .     Imaging: MRI of the brain  06/26/2024 showed a few scattered punctate T2/FLAIR hyperintense foci consistent with minimal age-appropriate chronic microvascular ischemic changes.  There were no acute findings  Temporal artery ultrasound 05/27/2024 Summary: Absence of a halo sign in the bilateral temporal artery, although not definitive, makes a diagnosis of temporal arteritis unlikely   MRI of the lumbar spine 07/25/2023 showed mild reduced disc height and endplate degenerative changes.  Specifically, there was disc bulging and facet hypertrophy at L3-L4.  Mild left foraminal narrowing but no nerve root compression or  spinal stenosis.  For L5, there was facet hypertrophy but no significant foraminal narrowing, lateral recess stenosis.  No spinal stenosis or nerve root compression.  At L5-S1, there was mild disc bulging towards the left causing mild left foraminal narrowing but no spinal stenosis or nerve root compression.  Laboratory: ESR was 22 on 04/07/2024, RF ws negative.     ESR was 2 on 11/28/2020 and 31 on 02/15/2022.  CRP was 1.09 (normal) on 11/28/2020 and 11.0 (less than 8 normal) on 02/15/2022  REVIEW OF SYSTEMS: Constitutional: No fevers, chills, sweats, or change in appetite Eyes: No visual changes, double vision, eye pain Ear, nose and throat: No hearing loss, ear pain, nasal congestion, sore throat Cardiovascular: No chest pain, palpitations Respiratory:  No shortness of breath at rest or with exertion.   No wheezes GastrointestinaI: No nausea, vomiting, diarrhea, abdominal pain, fecal incontinence Genitourinary:  No dysuria, urinary retention or frequency.  No nocturia. Musculoskeletal:  No neck pain, back pain Integumentary: No rash, pruritus, skin lesions Neurological: as above Psychiatric: No depression at this time.  No anxiety Endocrine: No palpitations, diaphoresis, change in appetite, change in weigh or increased thirst Hematologic/Lymphatic:  No anemia, purpura, petechiae. Allergic/Immunologic: No itchy/runny eyes, nasal congestion, recent allergic reactions, rashes  ALLERGIES: Allergies  Allergen Reactions   Cefepime  Cough    Patient develops cough, rash in her ears. 06/07/24 developed extreme itching with dose, tongue swelling   Contrast Media [Iodinated Contrast Media] Shortness Of Breath and Other (See Comments)    Low blood pressure    Midodrine  Shortness Of Breath   Avocado Itching and Other (See Comments)    Tongue and mouth.    Fish Allergy Hives and Swelling   Levofloxacin Hives and Other (See Comments)    Pt already has aortic aneurysm.   Pantoprazole  Other (See  Comments)    Joint aches, fever, peeling of skin   Quinolones Other (See Comments)    Contraindicated due to history of Aortic Aneurysm    Shellfish Allergy Hives and Swelling   Sulfa Antibiotics Hives   Vancomycin  Rash, Other (See Comments) and Cough    Patient develops cough, rash ears, tingling lips after receiving Cefepime  and vancomycin  01/23/2022 - Patient had an allergy test and the MD stated that it is not a real allergy and that it was just an infusion reaction    Augmentin  [Amoxicillin -Pot Clavulanate] Nausea And Vomiting    HOME MEDICATIONS:  Current Outpatient Medications:    acetaminophen  (TYLENOL ) 500 MG tablet, Take 500 mg by mouth every 6 (six) hours as needed., Disp: , Rfl:    ampicillin  IVPB, Inject 12 g into the vein daily. Indication:  polymicrobial infection - liver abscess, pulmonary cavitations First Dose: Yes Last Day of Therapy:  07/21/2024 Labs - Once weekly:  CBC/D and BMP, Labs - Once weekly: ESR and CRP Method of administration: Ambulatory Pump (Continuous Infusion) Method of administration may be changed at the discretion of home infusion pharmacist based upon assessment  of the patient and/or caregiver's ability to self-administer the medication ordered., Disp: 40 Units, Rfl: 0   famotidine  (PEPCID ) 40 MG tablet, Take 40 mg by mouth 2 (two) times daily., Disp: , Rfl:    meropenem  (MERREM ) IVPB, Inject 1 g into the vein every 8 (eight) hours. Indication: polymicrobial infection - liver abscess, pulmonary cavitations First Dose: Yes Last Day of Therapy:  07/21/2024 Labs - Once weekly:  CBC/D and BMP, Labs - Once weekly: ESR and CRP Method of administration: Mini-Bag Plus / Gravity Method of administration may be changed at the discretion of home infusion pharmacist based upon assessment of the patient and/or caregiver's ability to self-administer the medication ordered., Disp: 120 Units, Rfl: 0   PREDNISONE  PO, Take 12.5 mg by mouth daily., Disp: , Rfl:    saccharomyces  boulardii (FLORASTOR) 250 MG capsule, Take 1 capsule (250 mg total) by mouth 2 (two) times daily., Disp: 60 capsule, Rfl: 0   atovaquone  (MEPRON ) 750 MG/5ML suspension, Take 750 mg by mouth 2 (two) times daily. (Patient not taking: Reported on 07/12/2024), Disp: , Rfl:    COLESTID  1 g tablet, Take 1-2 g by mouth 2 (two) times daily. 1 TO 2 TABLETS BY MOUTH NOT WITHIN AN HOUR OF OTHER MEDICINES TWICE DAILY. (Patient not taking: Reported on 07/12/2024), Disp: , Rfl:    diclofenac  Sodium (VOLTAREN ) 1 % GEL, Apply 2 g topically 4 (four) times daily. (Patient not taking: Reported on 07/12/2024), Disp: , Rfl:    dicyclomine  (BENTYL ) 20 MG tablet, Take 1 tablet (20 mg total) by mouth 2 (two) times daily. (Patient not taking: Reported on 07/12/2024), Disp: 20 tablet, Rfl: 0   EPINEPHrine  0.3 mg/0.3 mL IJ SOAJ injection, Inject 0.3 mg into the muscle as needed for anaphylaxis. (Patient not taking: Reported on 07/12/2024), Disp: 2 each, Rfl: 1   fluticasone  (FLONASE ) 50 MCG/ACT nasal spray, Place 2 sprays into both nostrils daily. (Patient not taking: Reported on 07/12/2024), Disp: 16 g, Rfl: 5   loperamide  (IMODIUM ) 2 MG capsule, Take 1 capsule (2 mg total) by mouth every 4 (four) hours as needed for diarrhea or loose stools. (Patient not taking: Reported on 07/12/2024), Disp: , Rfl:   PAST MEDICAL HISTORY: Past Medical History:  Diagnosis Date   Allergic rhinitis    Aortic aneurysm 09/23/2020   Cancer (HCC)    Melanoma   Cervical dysplasia 2007   low-grade SIL, normal Pap smears since then   Family history of adverse reaction to anesthesia    mom has h/o post op nausea/vomiting   Intermittent palpitations    IUD    inserted 03/2002.04/06/2007, 06/03/2012   Ocular migraine    Osteomalacia    Pancreatitis    Seafood allergy     PAST SURGICAL HISTORY: Past Surgical History:  Procedure Laterality Date   BIOPSY  01/21/2022   Procedure: BIOPSY;  Surgeon: Dianna Specking, MD;  Location: [facility];   Service: Gastroenterology;;   ROMAYNE  2018   CHOLECYSTECTOMY N/A 04/09/2016   Procedure: LAPAROSCOPIC CHOLECYSTECTOMY ;  Surgeon: Donnice Bury, MD;  Location: [facility] OR;  Service: General;  Laterality: N/A;   ERCP  04/11/2016   ERCP with pancreatic stent and failed CBD stent after needle knife and conventional sphincterotomy   ERCP N/A 04/12/2016   Procedure: ENDOSCOPIC RETROGRADE CHOLANGIOPANCREATOGRAPHY (ERCP);  Surgeon: Oliva Boots, MD;  Location: [facility];  Service: Endoscopy;  Laterality: N/A;   ERCP N/A 01/15/2022   Procedure: ENDOSCOPIC RETROGRADE CHOLANGIOPANCREATOGRAPHY (ERCP);  Surgeon: Boots Oliva, MD;  Location: [facility];  Service: Gastroenterology;  Laterality: N/A;   ESOPHAGOGASTRODUODENOSCOPY N/A 06/02/2024   Procedure: EGD (ESOPHAGOGASTRODUODENOSCOPY);  Surgeon: Burnette Fallow, MD;  Location: [facility];  Service: Gastroenterology;  Laterality: N/A;   EUS N/A 06/02/2024   Procedure: ULTRASOUND, UPPER GI TRACT, ENDOSCOPIC;  Surgeon: Burnette Fallow, MD;  Location: [facility];  Service: Gastroenterology;  Laterality: N/A;   FLEXIBLE SIGMOIDOSCOPY N/A 01/21/2022   Procedure: FLEXIBLE SIGMOIDOSCOPY;  Surgeon: Dianna Specking, MD;  Location: [facility];  Service: Gastroenterology;  Laterality: N/A;   FOOT SURGERY     toenail   INTRAUTERINE DEVICE INSERTION  08/07/2017   Mirena    IR EXCHANGE BILIARY DRAIN  02/11/2022   IR RADIOLOGIST EVAL & MGMT  02/08/2022   IR RADIOLOGIST EVAL & MGMT  02/20/2022   IR RADIOLOGIST EVAL & MGMT  02/28/2022   removal of melanoma     REMOVAL OF STONES  01/15/2022   Procedure: REMOVAL OF STONES;  Surgeon: Rosalie Kitchens, MD;  Location: [facility];  Service: Gastroenterology;;   ANNETT  01/15/2022   Procedure: ANNETT;  Surgeon: Rosalie Kitchens, MD;  Location: [facility];  Service: Gastroenterology;;   TRANSESOPHAGEAL ECHOCARDIOGRAM (CATH LAB) N/A 06/09/2024   Procedure: TRANSESOPHAGEAL ECHOCARDIOGRAM;  Surgeon: Francyne Headland,  MD;  Location: [facility];  Service: Cardiovascular;  Laterality: N/A;    FAMILY HISTORY: Family History  Problem Relation Age of Onset   Tremor Mother    Stroke Father    Autoimmune disease Father    CVA Father    Breast cancer Sister 59       3X   Arthritis Sister        Psoriatic   Thyroid  disease Sister    Breast cancer Maternal Grandmother        Age 60   Thyroid  disease Daughter        Hypo   Uterine cancer Neg Hx    Bladder Cancer Neg Hx    Renal cancer Neg Hx     SOCIAL HISTORY: Social History   Socioeconomic History   Marital status: Married    Spouse name: Marcey Blumenthal   Number of children: 4   Years of education: Not on file   Highest education level: Not on file  Occupational History   Not on file  Tobacco Use   Smoking status: Former    Passive exposure: Past   Smokeless tobacco: Never  Vaping Use   Vaping status: Never Used  Substance and Sexual Activity   Alcohol use: Not Currently   Drug use: No   Sexual activity: Yes    Partners: Male    Birth control/protection: Post-menopausal    Comment: des neg  Other Topics Concern   Not on file  Social History Narrative   Not on file   Social Drivers of Health   Financial Resource Strain: Not on file  Food Insecurity: No Food Insecurity (06/06/2024)   Hunger Vital Sign    Worried About Running Out of Food in the Last Year: Never true    Ran Out of Food in the Last Year: Never true  Transportation Needs: No Transportation Needs (06/06/2024)   PRAPARE - Administrator, Civil Service (Medical): No    Lack of Transportation (Non-Medical): No  Physical Activity: Not on file  Stress: Not on file  Social Connections: Moderately Integrated (05/22/2024)   Social Connection and Isolation Panel    Frequency of Communication with Friends and Family: More than three times a week  Frequency of Social Gatherings with Friends and Family: More than three times a week    Attends Religious  Services: Never    Database administrator or Organizations: Yes    Attends Engineer, structural: More than 4 times per year    Marital Status: Married  Catering manager Violence: Not At Risk (06/06/2024)   Humiliation, Afraid, Rape, and Kick questionnaire    Fear of Current or Ex-Partner: No    Emotionally Abused: No    Physically Abused: No    Sexually Abused: No       PHYSICAL EXAM  Vitals:   07/12/24 1440  BP: 98/64  Pulse: 81  Height: 5' 5 (1.651 m)    Body mass index is 26.34 kg/m.   General: The patient is well-developed and well-nourished and in no acute distress  HEENT:  Head is [place]/AT.  Sclera are anicteric.  Funduscopic exam shows normal optic discs and retinal vessels.  Neck: No carotid bruits are noted.  The neck is nontender.  Cardiovascular: The heart has a regular rate and rhythm with a normal S1 and S2. There were no murmurs, gallops or rubs.    Skin: Extremities are without rash or  edema.  Musculoskeletal:  Back is nontender  Neurologic Exam  Mental status: The patient is alert and oriented x 3 at the time of the examination. The patient has apparent normal recent and remote memory, with an apparently normal attention span and concentration ability.   Speech is normal.  Cranial nerves: Extraocular movements are full. Pupils are equal, round, and reactive to light and accomodation.  There is good facial sensation to soft touch bilaterally.Facial strength is normal.  Trapezius and sternocleidomastoid strength is normal. No dysarthria is noted.  The tongue is midline, and the patient has symmetric elevation of the soft palate. No obvious hearing deficits are noted.  Motor:  Muscle bulk is normal.   Tone is normal. Strength is  5 / 5 in all 4 extremities.   Sensory: Sensory testing is intact to pinprick, soft touch and vibration sensation in all 4 extremities.  Coordination: Cerebellar testing reveals good finger-nose-finger and heel-to-shin  bilaterally.  Gait and station: Station is normal.   Gait is normal. Tandem gait is minimally wide but probably normal for age. Romberg is negative.   Reflexes: Deep tendon reflexes are symmetric and normal bilaterally.        DIAGNOSTIC DATA (LABS, IMAGING, TESTING) - I reviewed patient records, labs, notes, testing and imaging myself where available.  Lab Results  Component Value Date   WBC 10.3 06/12/2024   HGB 10.8 (L) 06/12/2024   HCT 35.6 (L) 06/12/2024   MCV 93.7 06/12/2024   PLT 260 06/12/2024      Component Value Date/Time   NA 139 06/12/2024 0538   NA 143 11/20/2020 1319   K 4.0 06/12/2024 0538   CL 102 06/12/2024 0538   CO2 27 06/12/2024 0538   GLUCOSE 76 06/12/2024 0538   BUN 9 06/12/2024 0538   BUN 14 11/20/2020 1319   CREATININE 0.64 06/12/2024 0538   CREATININE 0.64 02/15/2022 1541   CALCIUM 8.6 (L) 06/12/2024 0538   PROT 7.1 06/06/2024 1112   ALBUMIN 3.3 (L) 06/06/2024 1112   AST 25 06/06/2024 1112   ALT 44 06/06/2024 1112   ALKPHOS 144 (H) 06/06/2024 1112   BILITOT 0.7 06/06/2024 1112   GFRNONAA >60 06/12/2024 0538   GFRAA >60 04/17/2016 0517   Lab Results  Component Value Date  CHOL 183 04/07/2024   HDL 58 04/07/2024   LDLCALC 103 (H) 04/07/2024   LDLDIRECT 119 (H) 01/24/2023   TRIG 124 04/07/2024   CHOLHDL 3.2 04/07/2024   Lab Results  Component Value Date   HGBA1C 6.4 (H) 05/24/2024   Lab Results  Component Value Date   VITAMINB12 618 05/22/2024   Lab Results  Component Value Date   TSH 0.311 (L) 05/24/2024       ASSESSMENT AND PLAN  Right sided temporal headache  Temporal giant cell arteritis (HCC)  Sepsis, due to unspecified organism, unspecified whether acute organ dysfunction present (HCC)  Ocular migraine   In summary, Ms. Daily is a 58 year old woman with a 37-month history of right sided headache near the temple.  Due to myalgias and the headache, she was clinically diagnosed with polymyalgia rheumatica and  started on steroids.  Symptoms did not improve after she started steroids.  Due to infection the steroids are now being tapered off.  I discussed with her that at this point, several months after onset of symptoms, it is difficult to know for certain whether or not she has giant cell arteritis.  On 1 hand symptoms would be consistent but on the other hand ESR was near normal at 22 (0-20 normal).  Regardless, I agree with the current steroid titration to lower her risk of infection and other systemic side effects from steroid.  Hopefully her symptoms will continue to be minimal.  I had considered initiating a headache prophylactic medication such as a TCA like imipramine, however, because the current headache intensity is fairly low we will hold off on any treatment at this time.  I reassured her that the MRI looked fairly normal for age.  She will return to see me as needed if she has new or worsening neurologic symptoms.  If headaches intensify I would consider a tricyclic or another agent initially and further testing is indicated.  Thank you for asking me to see this patient.  Please let me know if I can be of further assistance with her or other patients in the future.   Milarose Savich A. Vear, MD, [facility] 07/12/2024, 8:04 PM Certified in Neurology, Clinical Neurophysiology, Sleep Medicine and Neuroimaging  [facility] [address] [place], [place] [address] [phone]

## 2024-07-12 ENCOUNTER — Ambulatory Visit (INDEPENDENT_AMBULATORY_CARE_PROVIDER_SITE_OTHER): Admitting: Neurology

## 2024-07-12 ENCOUNTER — Encounter: Payer: Self-pay | Admitting: Neurology

## 2024-07-12 VITALS — BP 98/64 | HR 81 | Ht 65.0 in

## 2024-07-12 DIAGNOSIS — G43109 Migraine with aura, not intractable, without status migrainosus: Secondary | ICD-10-CM

## 2024-07-12 DIAGNOSIS — R519 Headache, unspecified: Secondary | ICD-10-CM

## 2024-07-12 DIAGNOSIS — A419 Sepsis, unspecified organism: Secondary | ICD-10-CM

## 2024-07-12 DIAGNOSIS — M316 Other giant cell arteritis: Secondary | ICD-10-CM | POA: Diagnosis not present

## 2024-07-12 NOTE — Telephone Encounter (Signed)
 Await til  apt for picc line removal measures

## 2024-07-14 ENCOUNTER — Telehealth: Payer: Self-pay

## 2024-07-14 NOTE — Telephone Encounter (Signed)
 Received call from Jenna, [facility] RN with [facility], wanting to make sure provider had been able to review patient's labs as ALT and AST are trending upward. Labs available in [facility].   Duwaine Lowe, BSN, RN

## 2024-07-14 NOTE — Telephone Encounter (Signed)
 Apologies for not seeing these labs until now. The 10/13 ones took a long time to result and recently received the 10/20 labs.  9/14   - AST 25, ALT 44, Alk Phos 144 (while admitted)  10/6   - AST 43, ALT 54, Alk phos 256  10/13 - AST 67, ALT 83, Alk Phos 338  10/20 - AST 94, ALT 112, Alk phos 456  Patient is receiving treatment for liver abscess. No other notable changes to labs. Please advise, Dr. Dennise.  Alan Geralds, PharmD, CPP, BCIDP, AAHIVP Clinical Pharmacist Practitioner Infectious Diseases Clinical Pharmacist [facility]

## 2024-07-15 ENCOUNTER — Ambulatory Visit (HOSPITAL_COMMUNITY)
Admission: RE | Admit: 2024-07-15 | Discharge: 2024-07-15 | Disposition: A | Discharge status: Home / Self Care | Source: Ambulatory Visit | Attending: Internal Medicine | Admitting: Internal Medicine

## 2024-07-15 ENCOUNTER — Encounter (HOSPITAL_COMMUNITY): Payer: Self-pay

## 2024-07-15 DIAGNOSIS — K750 Abscess of liver: Secondary | ICD-10-CM | POA: Insufficient documentation

## 2024-07-16 ENCOUNTER — Other Ambulatory Visit: Payer: Self-pay | Admitting: Medical Genetics

## 2024-07-16 DIAGNOSIS — Z006 Encounter for examination for normal comparison and control in clinical research program: Secondary | ICD-10-CM

## 2024-07-19 ENCOUNTER — Encounter: Payer: Self-pay | Admitting: Internal Medicine

## 2024-07-19 ENCOUNTER — Ambulatory Visit (INDEPENDENT_AMBULATORY_CARE_PROVIDER_SITE_OTHER): Admitting: Internal Medicine

## 2024-07-19 ENCOUNTER — Ambulatory Visit (INDEPENDENT_AMBULATORY_CARE_PROVIDER_SITE_OTHER)

## 2024-07-19 ENCOUNTER — Telehealth: Payer: Self-pay | Admitting: Internal Medicine

## 2024-07-19 ENCOUNTER — Other Ambulatory Visit: Payer: Self-pay

## 2024-07-19 VITALS — BP 111/78 | HR 85 | Temp 97.9°F | Resp 16 | Wt 155.0 lb

## 2024-07-19 VITALS — BP 104/73 | HR 96 | Temp 98.3°F | Ht 65.0 in | Wt 155.0 lb

## 2024-07-19 DIAGNOSIS — Z7952 Long term (current) use of systemic steroids: Secondary | ICD-10-CM | POA: Diagnosis not present

## 2024-07-19 DIAGNOSIS — B965 Pseudomonas (aeruginosa) (mallei) (pseudomallei) as the cause of diseases classified elsewhere: Secondary | ICD-10-CM | POA: Diagnosis not present

## 2024-07-19 DIAGNOSIS — M316 Other giant cell arteritis: Secondary | ICD-10-CM

## 2024-07-19 DIAGNOSIS — J984 Other disorders of lung: Secondary | ICD-10-CM

## 2024-07-19 DIAGNOSIS — R911 Solitary pulmonary nodule: Secondary | ICD-10-CM | POA: Diagnosis not present

## 2024-07-19 DIAGNOSIS — R053 Chronic cough: Secondary | ICD-10-CM | POA: Diagnosis not present

## 2024-07-19 DIAGNOSIS — K750 Abscess of liver: Secondary | ICD-10-CM

## 2024-07-19 DIAGNOSIS — R7881 Bacteremia: Secondary | ICD-10-CM

## 2024-07-19 DIAGNOSIS — B370 Candidal stomatitis: Secondary | ICD-10-CM

## 2024-07-19 MED ORDER — AMOXICILLIN 500 MG PO CAPS: 1000.0000 mg | ORAL_CAPSULE | Freq: Three times a day (TID) | ORAL | 2 refills | Status: DC

## 2024-07-19 NOTE — Progress Notes (Signed)
 Patient: Laurie Terry  DOB: [birth year] MRN: [id number] PCP: [name], MD    Chief Complaint  Patient presents with   Follow-up    Liver abscess      Patient Active Problem List   Diagnosis Date Noted   Acute bacterial endocarditis 06/09/2024   Liver abscess 06/08/2024   Temporal giant cell arteritis (HCC) 06/08/2024   Severe sepsis (HCC) 06/06/2024   PMR (polymyalgia rheumatica) 06/06/2024   Pubic bone pain 05/27/2024   Sinus bradycardia 05/26/2024   Bacteremia due to Pseudomonas 05/24/2024   Normocytic anemia 05/24/2024   Dilatation of aorta 01/24/2023   Perinephric abscess 03/03/2022   Norovirus 02/15/2022   Dry cough 02/02/2022   Iatrogenic subdiaphragmatic abscess (HCC) 02/02/2022   Pleural effusion on right    Post-ERCP acute pancreatitis 01/16/2022   GERD without esophagitis 01/16/2022   Choledocholithiasis 01/16/2022   Adverse effect of other drugs, medicaments and biological substances, subsequent encounter 05/23/2021   Other allergic rhinitis 05/23/2021   Allergic conjunctivitis of both eyes 05/23/2021   History of asthma 05/23/2021   Oral allergy syndrome, subsequent encounter 05/23/2021   Family history of bicuspid aortic valve 11/28/2020   Other hyperlipidemia 11/28/2020   PAC (premature atrial contraction) 10/30/2020   Postoperative bile leak 04/12/2016   Acute hypokalemia 04/12/2016   Paroxysmal SVT (supraventricular tachycardia) 04/12/2016   Bile leak, postoperative 04/12/2016   S/P laparoscopic cholecystectomy    History of Gallstone pancreatitis 04/07/2016   Pancreatitis due to biliary obstruction (HCC) 04/07/2016   Cancer (HCC)    Osteopenia      Subjective:  Laurie Terry is a 58 year old female with past medical history of giant cell arteritis with PMR on prednisone  started on prophylactic atovaquone , history of intra-abdominal abscesses subhepatic/perinephric cultures growing E faecalis Candida albicans following ERCP  in 2023 she was treated with Augmentin  and fluconazole , recent admission 8/27 - 9/9 with Klebsiella pneumonia bacteremia suspected secondary to GI source found to have hepatic abscess status post aspiration growing E faecium, otitis pubis discharged on amoxicillin  and cefadroxil  presents for hospital follow-up of Pseudomonas bacteremia.  She was has been 9/14 - 9/22 after presented from GI clinic with light headedness and low blood pressure.  Blood cultures grew Pseudomonas.  CT showed concern for multifocal cavitary pneumonia/septic emboli as well as hepatic abscesses.  TEE was without vegetation.  Small PFO with left-to-right shunt noted.  Hepatic abscesses appear too small in size for IR to drain at that time.  She was discharged on meropenem  and ampicillin  x 6 weeks from 9/17 EOT 10/29  Today: notes she had some issues with abx. ,Loose stool is on and off since discharge from hospital.she has had negative c diff testing in the interim(GI office). No abd pain. She has a non productive cougt as such ct chest, abdomen, pelvis ordered.   Review of Systems  All other systems reviewed and are negative.   Past Medical History:  Diagnosis Date   Allergic rhinitis    Aortic aneurysm 09/23/2020   Cancer (HCC)    Melanoma   Cervical dysplasia 2007   low-grade SIL, normal Pap smears since then   Family history of adverse reaction to anesthesia    mom has h/o post op nausea/vomiting   Intermittent palpitations    IUD    inserted 03/2002.04/06/2007, 06/03/2012   Ocular migraine    Osteomalacia    Pancreatitis    Seafood allergy     Outpatient Medications Prior to Visit  Medication Sig  Dispense Refill   acetaminophen  (TYLENOL ) 500 MG tablet Take 500 mg by mouth every 6 (six) hours as needed.     ampicillin  IVPB Inject 12 g into the vein daily. Indication:  polymicrobial infection - liver abscess, pulmonary cavitations First Dose: Yes Last Day of Therapy:  07/21/2024 Labs - Once weekly:  CBC/D and  BMP, Labs - Once weekly: ESR and CRP Method of administration: Ambulatory Pump (Continuous Infusion) Method of administration may be changed at the discretion of home infusion pharmacist based upon assessment of the patient and/or caregiver's ability to self-administer the medication ordered. 40 Units 0   famotidine  (PEPCID ) 40 MG tablet Take 40 mg by mouth 2 (two) times daily.     meropenem  (MERREM ) IVPB Inject 1 g into the vein every 8 (eight) hours. Indication: polymicrobial infection - liver abscess, pulmonary cavitations First Dose: Yes Last Day of Therapy:  07/21/2024 Labs - Once weekly:  CBC/D and BMP, Labs - Once weekly: ESR and CRP Method of administration: Mini-Bag Plus / Gravity Method of administration may be changed at the discretion of home infusion pharmacist based upon assessment of the patient and/or caregiver's ability to self-administer the medication ordered. 120 Units 0   PREDNISONE  PO Take 12.5 mg by mouth daily.     saccharomyces boulardii (FLORASTOR) 250 MG capsule Take 1 capsule (250 mg total) by mouth 2 (two) times daily. 60 capsule 0   atovaquone  (MEPRON ) 750 MG/5ML suspension Take 750 mg by mouth 2 (two) times daily. (Patient not taking: Reported on 07/12/2024)     COLESTID  1 g tablet Take 1-2 g by mouth 2 (two) times daily. 1 TO 2 TABLETS BY MOUTH NOT WITHIN AN HOUR OF OTHER MEDICINES TWICE DAILY. (Patient not taking: Reported on 07/12/2024)     diclofenac  Sodium (VOLTAREN ) 1 % GEL Apply 2 g topically 4 (four) times daily. (Patient not taking: Reported on 07/12/2024)     dicyclomine  (BENTYL ) 20 MG tablet Take 1 tablet (20 mg total) by mouth 2 (two) times daily. (Patient not taking: Reported on 07/12/2024) 20 tablet 0   EPINEPHrine  0.3 mg/0.3 mL IJ SOAJ injection Inject 0.3 mg into the muscle as needed for anaphylaxis. (Patient not taking: Reported on 07/12/2024) 2 each 1   fluticasone  (FLONASE ) 50 MCG/ACT nasal spray Place 2 sprays into both nostrils daily. (Patient  not taking: Reported on 07/12/2024) 16 g 5   loperamide  (IMODIUM ) 2 MG capsule Take 1 capsule (2 mg total) by mouth every 4 (four) hours as needed for diarrhea or loose stools. (Patient not taking: Reported on 07/12/2024)     No facility-administered medications prior to visit.     Allergies  Allergen Reactions   Cefepime  Cough    Patient develops cough, rash in her ears. 06/07/24 developed extreme itching with dose, tongue swelling   Contrast Media [Iodinated Contrast Media] Shortness Of Breath and Other (See Comments)    Low blood pressure    Midodrine  Shortness Of Breath   Avocado Itching and Other (See Comments)    Tongue and mouth.    Fish Allergy Hives and Swelling   Levofloxacin Hives and Other (See Comments)    Pt already has aortic aneurysm.   Pantoprazole  Other (See Comments)    Joint aches, fever, peeling of skin   Quinolones Other (See Comments)    Contraindicated due to history of Aortic Aneurysm    Shellfish Allergy Hives and Swelling   Sulfa Antibiotics Hives   Vancomycin  Rash, Other (See Comments) and Cough  Patient develops cough, rash ears, tingling lips after receiving Cefepime  and vancomycin  01/23/2022 - Patient had an allergy test and the MD stated that it is not a real allergy and that it was just an infusion reaction    Augmentin  [Amoxicillin -Pot Clavulanate] Nausea And Vomiting    Social History   Tobacco Use   Smoking status: Former    Passive exposure: Past   Smokeless tobacco: Never  Vaping Use   Vaping status: Never Used  Substance Use Topics   Alcohol use: Not Currently   Drug use: No    Family History  Problem Relation Age of Onset   Tremor Mother    Stroke Father    Autoimmune disease Father    CVA Father    Breast cancer Sister 16       3X   Arthritis Sister        Psoriatic   Thyroid  disease Sister    Breast cancer Maternal Grandmother        Age 31   Thyroid  disease Daughter        Hypo   Uterine cancer Neg Hx    Bladder  Cancer Neg Hx    Renal cancer Neg Hx     Objective:   Vitals:   07/19/24 1107 07/19/24 1110  BP:  111/78  Pulse:  85  Resp:  16  Temp:  97.9 F (36.6 C)  TempSrc:  Oral  SpO2:  97%  Weight: 155 lb (70.3 kg)    Body mass index is 25.79 kg/m.  Physical Exam Constitutional:      Appearance: Normal appearance.  HENT:     Head: Normocephalic and atraumatic.     Right Ear: Tympanic membrane normal.     Left Ear: Tympanic membrane normal.     Nose: Nose normal.     Mouth/Throat:     Mouth: Mucous membranes are moist.  Eyes:     Extraocular Movements: Extraocular movements intact.     Conjunctiva/sclera: Conjunctivae normal.     Pupils: Pupils are equal, round, and reactive to light.  Cardiovascular:     Rate and Rhythm: Normal rate and regular rhythm.     Heart sounds: No murmur heard.    No friction rub. No gallop.  Pulmonary:     Effort: Pulmonary effort is normal.     Breath sounds: Normal breath sounds.  Abdominal:     General: Abdomen is flat.     Palpations: Abdomen is soft.  Musculoskeletal:        General: Normal range of motion.  Skin:    General: Skin is warm and dry.  Neurological:     General: No focal deficit present.     Mental Status: She is alert and oriented to person, place, and time.  Psychiatric:        Mood and Affect: Mood normal.     Lab Results: Lab Results  Component Value Date   WBC 10.3 06/12/2024   HGB 10.8 (L) 06/12/2024   HCT 35.6 (L) 06/12/2024   MCV 93.7 06/12/2024   PLT 260 06/12/2024    Lab Results  Component Value Date   CREATININE 0.64 06/12/2024   BUN 9 06/12/2024   NA 139 06/12/2024   K 4.0 06/12/2024   CL 102 06/12/2024   CO2 27 06/12/2024    Lab Results  Component Value Date   ALT 44 06/06/2024   AST 25 06/06/2024   ALKPHOS 144 (H) 06/06/2024   BILITOT 0.7 06/06/2024  Assessment & Plan:   Problem List Items Addressed This Visit       Digestive   Liver abscess - Primary   Relevant Orders    Ambulatory referral to Interventional Radiology   MR ABDOMEN W CONTRAST   #Pseudomonas bacteremia secondary to intra-abdominal source #History of Klebsiella bacteremia and E faecium hepatic abscesses secondary to intra-abdominal source - Patient admitted initially admitted 8/27 - 9/9 for Klebsiella bacteremia suspect secondary GI source.  She was found to have hepatic abscesses, underwent IR aspiration with cultures growing E faecium and she was discharged Amoxident cefadroxil  - She returned from GI clinic due to low blood pressure on 9/14 found to have Pseudomonas bacteremia - CT chest abdomen pelvis showed several scattered lung nodules which were new compared to CT on 8/15.  Several scattered hypodense hepatic lesions observed, noted to have increased in size from 1.6 X1 0.4-1.9 X1 0.7 cm compared to previous MRI 9/3 previous pelvic ascites resolved - TEE without vegetation, small PFO - She was discharged on 9/22 on meropenem  ampicillin  x 6 weeks EOT 10/29. CT chest abdomen pelvis noted multiple hypodense liver lesions some of which are enlarged compared to prior examination.  Others are diminished in size.  Enlarged lesion concerning for persistent/progressive hepatic abscesses.  Consider MR enhanced contrast for further evaluation.  Previously seen bilateral cavitary pulmonary nodules are almost completely resolved. Plan: - Will continue meropenem  till MR with contrast.  OPAT orders in separate note - Referral to interventional radiology to see if any of the liver lesions are able to be aspirated.  If it is possible to aspirate please obtain's cultures and pathology - Given loose stools on and off..  Will switch ampicillin  to amoxicillin .  Continue probiotics. - Follow-up with ID in 1 month, 1 week after imaging.  #Medication management #PICC line #Elevated LFTs - On 10/20 AST 94, ALT 112, alkaline phosphatase 456, prior to this 67/83/338.  I suspect some of this is due to hepatic lesions.   Plan as above.  Rate of rise of LFTs is decreasing.  #flaking skin(cerostomia) -no signs of fungal skine infection -If continues to be concerned will refer to derm  #oral thursh Not seen  to day on exam. Pt states that her thrush returns if she stops nystatin . Recommended holding nystatin  and send image if concern for thrush  #cough -new since sance last visit. Non productive -ct improved. Had included ct chest given reported cough and lesion seen on ct.  previously -referall to pulmonolgy  Loney Stank, MD [facility]   07/19/24  11:29 AM I have personally spent 75 minutes involved in face-to-face and non-face-to-face activities for this patient on the day of the visit. Professional time spent includes the following activities: Preparing to see the patient (review of tests), Obtaining and/or reviewing separately obtained history (admission/discharge record), Performing a medically appropriate examination and/or evaluation , Ordering medications/tests/procedures, referring and communicating with other health care professionals, Documenting clinical information in the EMR, Independently interpreting results (not separately reported), Communicating results to the patient/family/caregiver, Counseling and educating the patient/family/caregiver and Care coordination (not separately reported).

## 2024-07-19 NOTE — Telephone Encounter (Signed)
 Per Dr.Singh - Stop IV ampicillin  and Extend IV meropenem  until 08/25/2024.   Message sent to [employer].  Romie Tay SHAUNNA Letters, CMA

## 2024-07-19 NOTE — Assessment & Plan Note (Addendum)
 On chronic prednisone  12.5mg  daily

## 2024-07-19 NOTE — Progress Notes (Signed)
 New Patient Pulmonology Office Visit   Subjective:  Patient ID: Laurie Terry, female    DOB: 10-Oct-1965  MRN: [id number]  Referred by: Dennise Kingsley, MD  CC:  Chief Complaint  Patient presents with   Consult    Patient had lung infection and developed cough. Seen by infectious diease. Ct result    Cough    Dry cough. Started last week.    HPI Laurie Terry is a 58 y.o. female with PMH significant for bilateral pleural effusion, lap cholecystectomy, pancreatitis due to biliary obstruction, paroxysmal SVT, thoracic aortic aneurysm without rupture,  PMR on chronic systemic steroids. In April/2023 she underwent ERCP which was complicated by micro perforation and fluid collection in the gallbladder fossa. She was also noted to have reactive pleural effusion. And required thoracentesis for that Used to follow up with Dr Geronimo  She was admitted to the hospital in 05/2024 for pseudomonas bacteremia. CT showed concern for multifocal cavitary pneumonia/septic emboli as well as hepatic abscesses. Small PFO with left-to-right shunt noted. Hepatic abscesses appear too small in size for IR to drain at that time. She was discharged on meropenem  and ampicillin  x 6 weeks from 9/17  She recently underwent CT abd pelvis/chest in 06/2024. It was concerning for  persistent/progressing hepatic abscesses. Lung cavities have resolved completely. She follows with ID. IR guided drainage has been ordered. She also complained of cough- hence was referred to this clinic.  Pt reports childhood hx of asthma. But this doesn't feel like allergies or asthma to her She denies sinus congestion and post nasal drip She does have hx of acid pepcid  reflux and has recently cut down on her pepcid  use. 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    ROS Review of symptoms negative except mentioned above   Allergies: Cefepime , Contrast media [iodinated contrast media], Midodrine ,  Avocado, Fish allergy, Levofloxacin, Pantoprazole , Quinolones, Shellfish allergy, Sulfa antibiotics, Vancomycin , and Augmentin  [amoxicillin -pot clavulanate]  Current Outpatient Medications:    acetaminophen  (TYLENOL ) 500 MG tablet, Take 500 mg by mouth every 6 (six) hours as needed., Disp: , Rfl:    amoxicillin  (AMOXIL ) 500 MG capsule, Take 2 capsules (1,000 mg total) by mouth 3 (three) times daily., Disp: 180 capsule, Rfl: 2   ampicillin  IVPB, Inject 12 g into the vein daily. Indication:  polymicrobial infection - liver abscess, pulmonary cavitations First Dose: Yes Last Day of Therapy:  07/21/2024 Labs - Once weekly:  CBC/D and BMP, Labs - Once weekly: ESR and CRP Method of administration: Ambulatory Pump (Continuous Infusion) Method of administration may be changed at the discretion of home infusion pharmacist based upon assessment of the patient and/or caregiver's ability to self-administer the medication ordered., Disp: 40 Units, Rfl: 0   EPINEPHrine  0.3 mg/0.3 mL IJ SOAJ injection, Inject 0.3 mg into the muscle as needed for anaphylaxis., Disp: 2 each, Rfl: 1   famotidine  (PEPCID ) 40 MG tablet, Take 40 mg by mouth 2 (two) times daily. (Patient taking differently: Take 40 mg by mouth 2 (two) times daily. Taking as needed), Disp: , Rfl:    meropenem  (MERREM ) IVPB, Inject 1 g into the vein every 8 (eight) hours. Indication: polymicrobial infection - liver abscess, pulmonary cavitations First Dose: Yes Last Day of Therapy:  07/21/2024 Labs - Once weekly:  CBC/D and BMP, Labs - Once weekly: ESR and CRP Method of administration: Mini-Bag Plus / Gravity Method of administration may be changed at the discretion of home infusion pharmacist based  upon assessment of the patient and/or caregiver's ability to self-administer the medication ordered., Disp: 120 Units, Rfl: 0   PREDNISONE  PO, Take 12.5 mg by mouth daily., Disp: , Rfl:    saccharomyces boulardii (FLORASTOR) 250 MG capsule, Take 1 capsule (250 mg  total) by mouth 2 (two) times daily., Disp: 60 capsule, Rfl: 0   atovaquone  (MEPRON ) 750 MG/5ML suspension, Take 750 mg by mouth 2 (two) times daily. (Patient not taking: Reported on 07/19/2024), Disp: , Rfl:    COLESTID  1 g tablet, Take 1-2 g by mouth 2 (two) times daily. 1 TO 2 TABLETS BY MOUTH NOT WITHIN AN HOUR OF OTHER MEDICINES TWICE DAILY. (Patient not taking: Reported on 07/19/2024), Disp: , Rfl:    diclofenac  Sodium (VOLTAREN ) 1 % GEL, Apply 2 g topically 4 (four) times daily. (Patient not taking: Reported on 07/12/2024), Disp: , Rfl:    dicyclomine  (BENTYL ) 20 MG tablet, Take 1 tablet (20 mg total) by mouth 2 (two) times daily. (Patient not taking: Reported on 07/19/2024), Disp: 20 tablet, Rfl: 0   fluticasone  (FLONASE ) 50 MCG/ACT nasal spray, Place 2 sprays into both nostrils daily. (Patient not taking: Reported on 07/19/2024), Disp: 16 g, Rfl: 5   loperamide  (IMODIUM ) 2 MG capsule, Take 1 capsule (2 mg total) by mouth every 4 (four) hours as needed for diarrhea or loose stools. (Patient not taking: Reported on 07/19/2024), Disp: , Rfl:  Past Medical History:  Diagnosis Date   Allergic rhinitis    Aortic aneurysm 09/23/2020   Cancer (HCC)    Melanoma   Cervical dysplasia 2007   low-grade SIL, normal Pap smears since then   Family history of adverse reaction to anesthesia    mom has h/o post op nausea/vomiting   Intermittent palpitations    IUD    inserted 03/2002.04/06/2007, 06/03/2012   Ocular migraine    Osteomalacia    Pancreatitis    Seafood allergy    Past Surgical History:  Procedure Laterality Date   BIOPSY  01/21/2022   Procedure: BIOPSY;  Surgeon: Dianna Specking, MD;  Location: [facility];  Service: Gastroenterology;;   ROMAYNE  2018   CHOLECYSTECTOMY N/A 04/09/2016   Procedure: LAPAROSCOPIC CHOLECYSTECTOMY ;  Surgeon: Donnice Bury, MD;  Location: [facility] OR;  Service: General;  Laterality: N/A;   ERCP  04/11/2016   ERCP with pancreatic stent and failed CBD  stent after needle knife and conventional sphincterotomy   ERCP N/A 04/12/2016   Procedure: ENDOSCOPIC RETROGRADE CHOLANGIOPANCREATOGRAPHY (ERCP);  Surgeon: Oliva Boots, MD;  Location: [facility];  Service: Endoscopy;  Laterality: N/A;   ERCP N/A 01/15/2022   Procedure: ENDOSCOPIC RETROGRADE CHOLANGIOPANCREATOGRAPHY (ERCP);  Surgeon: Boots Oliva, MD;  Location: [facility];  Service: Gastroenterology;  Laterality: N/A;   ESOPHAGOGASTRODUODENOSCOPY N/A 06/02/2024   Procedure: EGD (ESOPHAGOGASTRODUODENOSCOPY);  Surgeon: Burnette Fallow, MD;  Location: [facility];  Service: Gastroenterology;  Laterality: N/A;   EUS N/A 06/02/2024   Procedure: ULTRASOUND, UPPER GI TRACT, ENDOSCOPIC;  Surgeon: Burnette Fallow, MD;  Location: [facility];  Service: Gastroenterology;  Laterality: N/A;   FLEXIBLE SIGMOIDOSCOPY N/A 01/21/2022   Procedure: FLEXIBLE SIGMOIDOSCOPY;  Surgeon: Dianna Specking, MD;  Location: [facility];  Service: Gastroenterology;  Laterality: N/A;   FOOT SURGERY     toenail   INTRAUTERINE DEVICE INSERTION  08/07/2017   Mirena    IR EXCHANGE BILIARY DRAIN  02/11/2022   IR RADIOLOGIST EVAL & MGMT  02/08/2022   IR RADIOLOGIST EVAL & MGMT  02/20/2022   IR RADIOLOGIST EVAL & MGMT  02/28/2022  removal of melanoma     REMOVAL OF STONES  01/15/2022   Procedure: REMOVAL OF STONES;  Surgeon: Rosalie Kitchens, MD;  Location: [facility];  Service: Gastroenterology;;   ANNETT  01/15/2022   Procedure: ANNETT;  Surgeon: Rosalie Kitchens, MD;  Location: [facility];  Service: Gastroenterology;;   TRANSESOPHAGEAL ECHOCARDIOGRAM (CATH LAB) N/A 06/09/2024   Procedure: TRANSESOPHAGEAL ECHOCARDIOGRAM;  Surgeon: Francyne Headland, MD;  Location: [facility];  Service: Cardiovascular;  Laterality: N/A;   Family History  Problem Relation Age of Onset   Tremor Mother    Stroke Father    Autoimmune disease Father    CVA Father    Breast cancer Sister 60       3X   Arthritis Sister         Psoriatic   Thyroid  disease Sister    Breast cancer Maternal Grandmother        Age 59   Thyroid  disease Daughter        Hypo   Uterine cancer Neg Hx    Bladder Cancer Neg Hx    Renal cancer Neg Hx    Social History   Socioeconomic History   Marital status: Married    Spouse name: Marcey Blumenthal   Number of children: 4   Years of education: Not on file   Highest education level: Not on file  Occupational History   Not on file  Tobacco Use   Smoking status: Former    Passive exposure: Past   Smokeless tobacco: Never   Tobacco comments:    Patient said a long time ago. 07/19/2024  Vaping Use   Vaping status: Never Used  Substance and Sexual Activity   Alcohol use: Not Currently   Drug use: No   Sexual activity: Yes    Partners: Male    Birth control/protection: Post-menopausal    Comment: des neg  Other Topics Concern   Not on file  Social History Narrative   Not on file   Social Drivers of Health   Financial Resource Strain: Not on file  Food Insecurity: No Food Insecurity (06/06/2024)   Hunger Vital Sign    Worried About Running Out of Food in the Last Year: Never true    Ran Out of Food in the Last Year: Never true  Transportation Needs: No Transportation Needs (06/06/2024)   PRAPARE - Administrator, Civil Service (Medical): No    Lack of Transportation (Non-Medical): No  Physical Activity: Not on file  Stress: Not on file  Social Connections: Moderately Integrated (05/22/2024)   Social Connection and Isolation Panel    Frequency of Communication with Friends and Family: More than three times a week    Frequency of Social Gatherings with Friends and Family: More than three times a week    Attends Religious Services: Never    Database Administrator or Organizations: Yes    Attends Engineer, Structural: More than 4 times per year    Marital Status: Married  Catering Manager Violence: Not At Risk (06/06/2024)   Humiliation, Afraid, Rape, and  Kick questionnaire    Fear of Current or Ex-Partner: No    Emotionally Abused: No    Physically Abused: No    Sexually Abused: No         Objective:  BP 104/73   Pulse 96   Temp 98.3 F (36.8 C) (Oral)   Ht 5' 5 (1.651 m)   Wt 155 lb (70.3 kg)  SpO2 96%   BMI 25.79 kg/m    Physical Exam Constitutional:      General: She is not in acute distress.    Appearance: Normal appearance.  HENT:     Mouth/Throat:     Mouth: Mucous membranes are moist.  Cardiovascular:     Rate and Rhythm: Normal rate.  Pulmonary:     Effort: No respiratory distress.     Breath sounds: No wheezing or rales.  Musculoskeletal:     Right lower leg: No edema.     Left lower leg: No edema.  Skin:    General: Skin is warm.  Neurological:     Mental Status: She is alert and oriented to person, place, and time.  Psychiatric:        Mood and Affect: Mood normal.     Diagnostic Review:    Pft     No data to display             CT chest 07/17/2024 Lungs/Pleura: Bandlike scarring of the bilateral lung bases. Previously seen bilateral cavitary pulmonary nodules are almost completely resolved, small residual in the medial anterior left upper lobe measuring 0.6 cm (series 6, image 73). No pleural effusion or pneumothorax. Multiple hypodense liver lesions, some of which are enlarged compared to prior examination, others are diminished in size. Enlarged lesions are concerning for persistent/progressing hepatic abscesses.    Reviewed prior to current ID notes Reviewed prior pulmonary notes  Assessment & Plan:   Assessment & Plan Chronic cough I explained to the patient in detail that chronic cough could be a manifestation of chronic sinusitis with post nasal drip, cough variant asthma, Nonasthmatic eosinophilic bronchitis, gastroesophageal reflux, laryngopharyngeal reflux, parenchymal lung disease like fibrosis, pulmonary edema from congestive heart failure, habitual cough or lung  cancer. No ILD on CT chest. Rare scarring on areas on prior cavities Pt reports childhood hx of asthma. But this doesn't feel like allergies or asthma to her She denies sinus congestion and post nasal drip She does have hx of acid pepcid  reflux and has recently cut down on her pepcid  use. She will start taking it regularly Patient was instructed not to lay on bed earlier than 2 hours after last meal, was advised to avoid late night meals/snacks and to avoid coffee and caffeine as much as possible. Advised her to take pepcid  BID regularly Orders:   Pulmonary function test; Future  Bacteremia due to Pseudomonas As per ID    Cavitary lesion of lung Resolved Reviewed prior and current CT chest with the patient No evidence of residual cavities in lung. small residual in the medial anterior left upper lobe  This will be followed up in future scans she will get for hepatic scans    Temporal giant cell arteritis (HCC) On chronic prednisone  12.5mg  daily    Current chronic use of systemic steroids Pt reports it is being weaned down Orders:   Pulmonary function test; Future  Lung nodule       Thank you for the opportunity to take part in the care of Laurie Terry   Return in about 3 months (around 10/19/2024).   Eleaner Dibartolo Pleas, MD [facility] Office: [phone]   I spent 45 minutes in the care of AMANIE MCCULLEY today including reviewing labs (reviewing other providers notes), reviewing studies (CT scans), face to face time discussing treatment options (chronic cough),  , and documenting in the encounter.

## 2024-07-19 NOTE — Telephone Encounter (Signed)
 MRI abdomen in 4 weeks Referred to IR for aspiration of hepatic abscess Merrem  extended x 5 weeks D/c ampicillin , start amoxicillin  1gm tid.  Just fyi to pharmacy  OPAT Orders Discharge antibiotics to be given via PICC line Discharge antibiotics: ampicillin  and meropenem  per pharmacy dosing  Per pharmacy protocol  Duration: 5 weeks  End Date: 12/3   [facility] Care Per Protocol:   Home health RN for IV administration and teaching; PICC line care and labs.     Labs weekly while on IV antibiotics: X__ CBC with differential __ BMP X__ CMP X__ CRP X__ ESR __ Vancomycin  trough __ CK   __ Please pull PIC at completion of IV antibiotics X__ Please leave PIC in place until doctor has seen patient or been notified   Fax weekly labs to [phone]   Clinic Follow Up Appt: 5 weeks

## 2024-07-19 NOTE — Patient Instructions (Signed)
 Follow-up with GI Referral to radiology

## 2024-07-19 NOTE — Assessment & Plan Note (Addendum)
 As per ID

## 2024-07-19 NOTE — Patient Instructions (Addendum)
 It was a pleasure to see you today. Your pulmonary function test will be scheduled at check out Take pepcid  twice a day regularly

## 2024-07-20 ENCOUNTER — Telehealth: Payer: Self-pay

## 2024-07-20 NOTE — Telephone Encounter (Signed)
 Spoke with patient regarding transition from IV ampicillin  to oral amoxicillin . Patient reports having one more IV ampicillin  bag remaining and expects to finish the dose on 07/21/2024 by 4:00 pm and was asking when to start oral amoxicillin . I have instructed patient that they can start the oral amoxicillin  that evening prior to bedtime (on 07/21/2024). Patient verbally confirmed understanding and knows to take amoxicillin  3 times daily.   Thank you,   Feliciano Close, PharmD PGY2 Infectious Diseases Pharmacy Resident  07/20/2024 2:45 PM

## 2024-07-20 NOTE — Telephone Encounter (Signed)
 Per Dr. Dennise pt can d/c ampicillin  and start amoxicillin . Community message sent to [facility] to follow up on pt questions with picc line. Lorenda CHRISTELLA Code, RMA

## 2024-07-20 NOTE — Telephone Encounter (Signed)
 Pharmacy will defer all responses to Dr. Dennise on this. Thank you!

## 2024-07-21 ENCOUNTER — Telehealth: Payer: Self-pay

## 2024-07-21 NOTE — Telephone Encounter (Signed)
 Per Pam with [facility] reached out to patient to provide education on picc line after completing Ampicillin . Will instruct pt how to flush second lumen daily with saline and heparin. HH will ensure extra supplies are sent to pt.  Lorenda CHRISTELLA Code, RMA

## 2024-07-21 NOTE — Telephone Encounter (Signed)
 Leita at [facility] called wanting to know if Dr.Singh could add a MRCP to patient MRI on 11/21. If any questions Dr.Magod call back is [phone]

## 2024-07-23 ENCOUNTER — Other Ambulatory Visit (HOSPITAL_COMMUNITY): Payer: Self-pay | Admitting: Internal Medicine

## 2024-07-23 DIAGNOSIS — K750 Abscess of liver: Secondary | ICD-10-CM

## 2024-07-25 ENCOUNTER — Other Ambulatory Visit: Payer: Self-pay | Admitting: Student

## 2024-07-25 ENCOUNTER — Other Ambulatory Visit: Payer: Self-pay | Admitting: Radiology

## 2024-07-25 DIAGNOSIS — K750 Abscess of liver: Secondary | ICD-10-CM

## 2024-07-25 NOTE — H&P (Signed)
 Chief Complaint: Liver Abscesses. Request is for abscess aspiration   Referring Physician(s): Singh,Mayanka  Supervising Physician: Johann Sieving  Patient Status: [facility] - Out-pt  History of Present Illness: Laurie Terry is a 58 y.o. female  female. Outpatient. History of a fib, SVT, giant cell arteritis, polymyalgia rheumatica, biliary obstruction acute pancreatitis s/p ERCP with micro perforation  complicated by post op bile leak and bilateral pleural effusions. Thoracic aortic aneurysm. Admitted to the hospital in September 2025 for psuedomonas bacteremia. Found to have multifocal cavitary pneumonia, septic emboli, PFO and liver abscesses. At the time the abscesses were too small for percutaneous access. CT CAP from 10.23.25 shows enlarging liver abscesses. CT CT reads Multiple hypodense liver lesions, some of which are enlarged compared to prior examination, for example in the central right lobe of the liver measuring 2.3 x 1.9 cm (series 2, image 69) and in the posterior right lobe of the liver measuring 2.6 x 2.0 cm (series 2, image 62). Others are diminished in size, for example in the anterior right lobe of the liver measuring 0.9 x 0.8 cm (series 2, image 63). Team is requesting a liver abscess aspiration for further evaluation of bacteral or fungal infection.     Allergies include contrast dye. Reaction SHOB. Patient has been NPO since midnight. Patient currenlty on meropenem  until 152.3.25  Past Medical History:  Diagnosis Date   Allergic rhinitis    Aortic aneurysm 09/23/2020   Cancer (HCC)    Melanoma   Cervical dysplasia 2007   low-grade SIL, normal Pap smears since then   Family history of adverse reaction to anesthesia    mom has h/o post op nausea/vomiting   Intermittent palpitations    IUD    inserted 03/2002.04/06/2007, 06/03/2012   Ocular migraine    Osteomalacia    Pancreatitis    Seafood allergy     Past Surgical History:  Procedure Laterality Date    BIOPSY  01/21/2022   Procedure: BIOPSY;  Surgeon: Dianna Specking, MD;  Location: [facility];  Service: Gastroenterology;;   ROMAYNE  2018   CHOLECYSTECTOMY N/A 04/09/2016   Procedure: LAPAROSCOPIC CHOLECYSTECTOMY ;  Surgeon: Donnice Bury, MD;  Location[facility];  Service: General;  Laterality: N/A;   ERCP  04/11/2016   ERCP with pancreatic stent and failed CBD stent after needle knife and conventional sphincterotomy   ERCP N/A 04/12/2016   Procedure: ENDOSCOPIC RETROGRADE CHOLANGIOPANCREATOGRAPHY (ERCP);  Surgeon: Oliva Boots, MD;  Location: [facility] END[facility]COPY;  Service: Endoscopy;  Laterality: N/A;   ERCP N/A 01/15/2022   Procedure: ENDOSCOPIC RETROGRADE CHOLANGIOPANCREATOGRAPHY (ERCP);  Surgeon: Boots Oliva, MD;  Location: [facility];  Service: Gastroenterology;  Laterality: N/A;   ESOPHAGOGASTRODUODENOSCOPY N/A 06/02/2024   Procedure: EGD (ESOPHAGOGASTRODUODENOSCOPY);  Surgeon: Burnette Fallow, MD;  Location: [facility];  Service: Gastroenterology;  Laterality: N/A;   EUS N/A 06/02/2024   Procedure: ULTRASOUND, UPPER GI TRACT, ENDOSCOPIC;  Surgeon: Burnette Fallow, MD;  Location: [facility];  Service: Gastroenterology;  Laterality: N/A;   FLEXIBLE SIGMOIDOSCOPY N/A 01/21/2022   Procedure: FLEXIBLE SIGMOIDOSCOPY;  Surgeon: Dianna Specking, MD;  Location: [facility];  Service: Gastroenterology;  Laterality: N/A;   FOOT SURGERY     toenail   INTRAUTERINE DEVICE INSERTION  08/07/2017   Mirena    IR EXCHANGE BILIARY DRAIN  02/11/2022   IR RADIOLOGIST EVAL & MGMT  02/08/2022   IR RADIOLOGIST EVAL & MGMT  02/20/2022   IR RADIOLOGIST EVAL & MGMT  02/28/2022   removal of melanoma     REMOVAL  OF STONES  01/15/2022   Procedure: REMOVAL OF STONES;  Surgeon: Rosalie Kitchens, MD;  Location: [facility];  Service: Gastroenterology;;   ANNETT  01/15/2022   Procedure: ANNETT;  Surgeon: Rosalie Kitchens, MD;  Location: [facility];  Service: Gastroenterology;;   TRANSESOPHAGEAL  ECHOCARDIOGRAM (CATH LAB) N/A 06/09/2024   Procedure: TRANSESOPHAGEAL ECHOCARDIOGRAM;  Surgeon: Francyne Headland, MD;  Location: [facility];  Service: Cardiovascular;  Laterality: N/A;    Allergies: Cefepime , Contrast media [iodinated contrast media], Midodrine , Avocado, Fish allergy, Levofloxacin, Pantoprazole , Quinolones, Shellfish allergy, Sulfa antibiotics, Vancomycin , and Augmentin  [amoxicillin -pot clavulanate]  Medications: Prior to Admission medications   Medication Sig Start Date End Date Taking? Authorizing Provider  acetaminophen  (TYLENOL ) 500 MG tablet Take 500 mg by mouth every 6 (six) hours as needed.    [provider]  amoxicillin  (AMOXIL ) 500 MG capsule Take 2 capsules (1,000 mg total) by mouth 3 (three) times daily. 07/19/24   Dennise Kingsley, MD  atovaquone  (MEPRON ) 750 MG/5ML suspension Take 750 mg by mouth 2 (two) times daily. Patient not taking: Reported on 07/19/2024    [provider]  COLESTID  1 g tablet Take 1-2 g by mouth 2 (two) times daily. 1 TO 2 TABLETS BY MOUTH NOT WITHIN AN HOUR OF OTHER MEDICINES TWICE DAILY. Patient not taking: Reported on 07/19/2024 06/04/24   [provider]  diclofenac  Sodium (VOLTAREN ) 1 % GEL Apply 2 g topically 4 (four) times daily. Patient not taking: Reported on 07/12/2024 05/30/24   Vann, Jessica U, DO  dicyclomine  (BENTYL ) 20 MG tablet Take 1 tablet (20 mg total) by mouth 2 (two) times daily. Patient not taking: Reported on 07/19/2024 05/07/24   [name], MD  EPINEPHrine  0.3 mg/0.3 mL IJ SOAJ injection Inject 0.3 mg into the muscle as needed for anaphylaxis. 10/08/22   Tobie Arleta SQUIBB, MD  famotidine  (PEPCID ) 40 MG tablet Take 40 mg by mouth 2 (two) times daily. Patient taking differently: Take 40 mg by mouth 2 (two) times daily. Taking as needed    [provider]  fluticasone  (FLONASE ) 50 MCG/ACT nasal spray Place 2 sprays into both nostrils daily. Patient not taking: Reported on  07/19/2024 10/08/22   Tobie Arleta SQUIBB, MD  loperamide  (IMODIUM ) 2 MG capsule Take 1 capsule (2 mg total) by mouth every 4 (four) hours as needed for diarrhea or loose stools. Patient not taking: Reported on 07/19/2024 05/30/24   Vann, Jessica U, DO  PREDNISONE  PO Take 12.5 mg by mouth daily.    [provider]  saccharomyces boulardii (FLORASTOR) 250 MG capsule Take 1 capsule (250 mg total) by mouth 2 (two) times daily. 05/30/24   Juvenal Harlene PENNER, DO     Family History  Problem Relation Age of Onset   Tremor Mother    Stroke Father    Autoimmune disease Father    CVA Father    Breast cancer Sister 26       3X   Arthritis Sister        Psoriatic   Thyroid  disease Sister    Breast cancer Maternal Grandmother        Age 53   Thyroid  disease Daughter        Hypo   Uterine cancer Neg Hx    Bladder Cancer Neg Hx    Renal cancer Neg Hx     Social History   Socioeconomic History   Marital status: Married    Spouse name: Marcey Blumenthal   Number of children: 4  Years of education: Not on file   Highest education level: Not on file  Occupational History   Not on file  Tobacco Use   Smoking status: Former    Passive exposure: Past   Smokeless tobacco: Never   Tobacco comments:    Patient said a long time ago. 07/19/2024  Vaping Use   Vaping status: Never Used  Substance and Sexual Activity   Alcohol use: Not Currently   Drug use: No   Sexual activity: Yes    Partners: Male    Birth control/protection: Post-menopausal    Comment: des neg  Other Topics Concern   Not on file  Social History Narrative   Not on file   Social Drivers of Health   Financial Resource Strain: Not on file  Food Insecurity: No Food Insecurity (06/06/2024)   Hunger Vital Sign    Worried About Running Out of Food in the Last Year: Never true    Ran Out of Food in the Last Year: Never true  Transportation Needs: No Transportation Needs (06/06/2024)   PRAPARE - Scientist, Research (physical Sciences) (Medical): No    Lack of Transportation (Non-Medical): No  Physical Activity: Not on file  Stress: Not on file  Social Connections: Moderately Integrated (05/22/2024)   Social Connection and Isolation Panel    Frequency of Communication with Friends and Family: More than three times a week    Frequency of Social Gatherings with Friends and Family: More than three times a week    Attends Religious Services: Never    Database Administrator or Organizations: Yes    Attends Engineer, Structural: More than 4 times per year    Marital Status: Married    ECOG Status: {CHL ONC ECOG ED:[id number]}  Review of Systems: A 12 point ROS discussed and pertinent positives are indicated in the HPI above.  All other systems are negative.  Review of Systems  Vital Signs: There were no vitals taken for this visit.    Physical Exam  Imaging: CT CHEST ABDOMEN PELVIS WO CONTRAST Result Date: 07/17/2024 CLINICAL DATA:  History of hepatic abscess, pulmonary emboli * Tracking Code: BO * EXAM: CT CHEST, ABDOMEN AND PELVIS WITHOUT CONTRAST TECHNIQUE: Multidetector CT imaging of the chest, abdomen and pelvis was performed following the standard protocol without IV contrast. RADIATION DOSE REDUCTION: This exam was performed according to the departmental dose-optimization program which includes automated exposure control, adjustment of the mA and/or kV according to patient size and/or use of iterative reconstruction technique. COMPARISON:  06/06/2024 FINDINGS: CT CHEST FINDINGS Cardiovascular: Right upper extremity PICC. Normal heart size. No pericardial effusion. Mediastinum/Nodes: No enlarged mediastinal, hilar, or axillary lymph nodes. Thyroid  gland, trachea, and esophagus demonstrate no significant findings. Lungs/Pleura: Bandlike scarring of the bilateral lung bases. Previously seen bilateral cavitary pulmonary nodules are almost completely resolved, small residual in the medial anterior  left upper lobe measuring 0.6 cm (series 6, image 73). No pleural effusion or pneumothorax. Musculoskeletal: No chest wall abnormality. No acute osseous findings. CT ABDOMEN PELVIS FINDINGS Hepatobiliary: Multiple hypodense liver lesions, some of which are enlarged compared to prior examination, for example in the central right lobe of the liver measuring 2.3 x 1.9 cm (series 2, image 69) and in the posterior right lobe of the liver measuring 2.6 x 2.0 cm (series 2, image 62). Others are diminished in size, for example in the anterior right lobe of the liver measuring 0.9 x 0.8 cm (series 2, image 63).  Cholecystectomy. Postoperative pneumobilia. Pancreas: Unremarkable. No pancreatic ductal dilatation or surrounding inflammatory changes. Spleen: Normal in size without significant abnormality. Adrenals/Urinary Tract: Adrenal glands are unremarkable. Punctuate nonobstructive right renal calculus. No left-sided calculi, ureteral calculi, or hydronephrosis. Bladder is unremarkable. Stomach/Bowel: Stomach is within normal limits. Appendix appears normal. No evidence of bowel wall thickening, distention, or inflammatory changes. Vascular/Lymphatic: No significant vascular findings are present. No enlarged abdominal or pelvic lymph nodes. Reproductive: No mass or other abnormality. Other: No abdominal wall hernia or abnormality. No ascites. Musculoskeletal: No acute osseous findings. IMPRESSION: 1. Multiple hypodense liver lesions, some of which are enlarged compared to prior examination, others are diminished in size. Enlarged lesions are concerning for persistent/progressing hepatic abscesses. Consider contrast enhanced MRI to further evaluate. 2. Previously seen bilateral cavitary pulmonary nodules are almost completely resolved. 3. Nonobstructive right nephrolithiasis. Electronically Signed   By: Marolyn JONETTA Jaksch M.D.   On: 07/17/2024 21:50   MR BRAIN W WO CONTRAST Result Date: 06/26/2024 EXAM: MRI BRAIN WITH AND  WITHOUT CONTRAST 06/26/2024 10:44:38 AM TECHNIQUE: Multiplanar multisequence MRI of the head/brain was performed with and without the administration of 7 mL of gadobutrol  (GADAVIST ) 1 MMOL/ML injection. COMPARISON: Prior 11/04/2001. CLINICAL HISTORY: New daily persistent headache. FINDINGS: BRAIN AND VENTRICLES: Age-related atrophy and mild periventricular white matter disease. No acute infarct. No acute intracranial hemorrhage. No mass effect or midline shift. No hydrocephalus. The sella is unremarkable. Normal flow voids. No mass or abnormal enhancement. ORBITS: No acute abnormality. SINUSES: There is a polypoid mucosal density within the floor of the right maxillary sinus. BONES AND SOFT TISSUES: Normal bone marrow signal and enhancement. No acute soft tissue abnormality. IMPRESSION: 1. No acute intracranial abnormality. 2. Age-related atrophy and mild periventricular white matter disease. Electronically signed by: Evalene Coho MD 06/26/2024 11:11 AM EDT RP Workstation: [id number]    Labs:  CBC: Recent Labs    06/07/24 0419 06/10/24 0504 06/11/24 0444 06/12/24 0538  WBC 8.5 7.5 8.8 10.3  HGB 9.0* 10.4* 10.4* 10.8*  HCT 28.7* 32.6* 34.3* 35.6*  PLT 199 203 227 260    COAGS: Recent Labs    05/27/24 0543  INR 1.0    BMP: Recent Labs    06/07/24 0419 06/10/24 0504 06/11/24 0444 06/12/24 0538  NA 142 142 140 139  K 3.3* 3.6 3.7 4.0  CL 106 104 104 102  CO2 25 27 25 27   GLUCOSE 102* 73 67* 76  BUN 11 10 12 9   CALCIUM 8.0* 8.7* 8.2* 8.6*  CREATININE 0.49 0.51 0.55 0.64  GFRNONAA >60 >60 >60 >60    LIVER FUNCTION TESTS: Recent Labs    05/27/24 0543 05/28/24 0501 05/29/24 0706 06/06/24 1112  BILITOT 1.0 1.1 0.7 0.7  AST 14* 18 17 25   ALT 22 26 29  44  ALKPHOS 82 100 93 144*  PROT 5.6* 5.8* 5.5* 7.1  ALBUMIN 2.0* 2.2* 2.2* 3.3*    TUMOR MARKERS: No results for input(s): AFPTM, CEA, CA199, CHROMGRNA in the last 8760 hours.  Assessment and Plan:  58  y.o. female outpatient. History of a fib, SVT, giant cell arteritis, polymyalgia rheumatica, biliary obstruction acute pancreatitis s/p ERCP with micro perforation  complicated by post op bile leak and bilateral pleural effusions. Thoracic aortic aneurysm. Admitted to the hospital in September 2025 for psuedomonas bacteremia. Found to have multifocal cavitary pneumonia, septic emboli, PFO and liver abscesses. At the time the abscesses were too small for percutaneous access. CT CAP from 10.23.25 shows enlarging liver abscesses. CT reads Multiple  hypodense liver lesions, some of which are enlarged compared to prior examination, for example in the central right lobe of the liver measuring 2.3 x 1.9 cm (series 2, image 69) and in the posterior right lobe of the liver measuring 2.6 x 2.0 cm (series 2, image 62). Others are diminished in size, for example in the anterior right lobe of the liver measuring 0.9 x 0.8 cm (series 2, image 63). Team is requesting a liver abscess aspiration for further evaluation of bacteral or fungal infection.   PLAN: IR Image Guided Liver Abscess Aspiration.  Risks and benefits discussed with the patient including bleeding, infection, damage to adjacent structures, bowel perforation/fistula connection, and sepsis.  All of the patient's questions were answered, patient is agreeable to proceed. Consent signed and in chart.       Thank you for this interesting consult.  I greatly enjoyed meeting CHELSE MATAS and look forward to participating in their care.  A copy of this report was sent to the requesting provider on this date.  Electronically Signed: Delon JAYSON Beagle, NP 07/25/2024, 9:32 PM   I spent a total of {New PWEU:[id number]} {New Out-Pt:[id number]}  {Established Out-Pt:[id number]} in face to face in clinical consultation, greater than 50% of which was counseling/coordinating care for ***

## 2024-07-26 ENCOUNTER — Other Ambulatory Visit: Payer: Self-pay

## 2024-07-26 ENCOUNTER — Ambulatory Visit (HOSPITAL_COMMUNITY)
Admission: RE | Admit: 2024-07-26 | Discharge: 2024-07-26 | Disposition: A | Discharge status: Home / Self Care | Source: Ambulatory Visit | Attending: Internal Medicine | Admitting: Internal Medicine

## 2024-07-26 DIAGNOSIS — K750 Abscess of liver: Secondary | ICD-10-CM | POA: Diagnosis present

## 2024-07-26 LAB — CBC
HCT: 39 % (ref 36.0–46.0)
Hemoglobin: 12.4 g/dL (ref 12.0–15.0)
MCH: 29.8 pg (ref 26.0–34.0)
MCHC: 31.8 g/dL (ref 30.0–36.0)
MCV: 93.8 fL (ref 80.0–100.0)
Platelets: 383 K/uL (ref 150–400)
RBC: 4.16 MIL/uL (ref 3.87–5.11)
RDW: 15.2 % (ref 11.5–15.5)
WBC: 7.9 K/uL (ref 4.0–10.5)
nRBC: 0 % (ref 0.0–0.2)

## 2024-07-26 LAB — PROTIME-INR
INR: 0.9 (ref 0.8–1.2)
Prothrombin Time: 12.9 s (ref 11.4–15.2)

## 2024-07-26 MED ORDER — FENTANYL CITRATE (PF) 100 MCG/2ML IJ SOLN
INTRAMUSCULAR | Status: AC
  Filled 2024-07-26: qty 2

## 2024-07-26 MED ORDER — HYDROCODONE-ACETAMINOPHEN 5-325 MG PO TABS: 1.0000 | ORAL_TABLET | ORAL | Status: DC | PRN

## 2024-07-26 MED ORDER — MIDAZOLAM HCL (PF) 2 MG/2ML IJ SOLN
INTRAMUSCULAR | Status: AC | PRN
  Administered 2024-07-26: .5 mg via INTRAVENOUS

## 2024-07-26 MED ORDER — MIDAZOLAM HCL 2 MG/2ML IJ SOLN
INTRAMUSCULAR | Status: AC
  Filled 2024-07-26: qty 2

## 2024-07-26 MED ORDER — FENTANYL CITRATE (PF) 100 MCG/2ML IJ SOLN
INTRAMUSCULAR | Status: AC | PRN
  Administered 2024-07-26: 25 ug via INTRAVENOUS

## 2024-07-26 MED ORDER — SODIUM CHLORIDE 0.9 % IV SOLN: INTRAVENOUS | Status: DC

## 2024-07-26 MED ORDER — LIDOCAINE HCL (PF) 1 % IJ SOLN: 10.0000 mL | Freq: Once | INTRAMUSCULAR | Status: DC

## 2024-07-26 NOTE — Progress Notes (Signed)
 Discharge instructions reviewed with patient. Denies questions or concerns. PT tolerated PO intake. Pt was able to void without difficulty. Incision site remains clean dry and intact. No s/s of complications. PT escorted from the unit via wheel chair to personal vehicle.

## 2024-07-26 NOTE — Telephone Encounter (Signed)
 Merrem  3 times per day. Extend picc care while on abx

## 2024-07-26 NOTE — Procedures (Signed)
  Procedure:  US  liver aspiration 1.55ml bloody purulent R lobe lesion Preprocedure diagnosis: Diagnoses of Hepatic abscess and Liver abscess were pertinent to this visit. Postprocedure diagnosis: same EBL:    minimal Complications:   none immediate  See full dictation in [employer].  CHARM Toribio Faes MD Main # [phone] Pager  [phone] Mobile [phone]

## 2024-07-30 ENCOUNTER — Ambulatory Visit: Admitting: Neurology

## 2024-07-31 LAB — AEROBIC/ANAEROBIC CULTURE W GRAM STAIN (SURGICAL/DEEP WOUND): Culture: NO GROWTH

## 2024-08-02 NOTE — Telephone Encounter (Signed)
 Follow up message sent to [facility]

## 2024-08-03 ENCOUNTER — Other Ambulatory Visit: Payer: Self-pay

## 2024-08-03 ENCOUNTER — Encounter (HOSPITAL_BASED_OUTPATIENT_CLINIC_OR_DEPARTMENT_OTHER): Payer: Self-pay

## 2024-08-03 ENCOUNTER — Inpatient Hospital Stay (HOSPITAL_BASED_OUTPATIENT_CLINIC_OR_DEPARTMENT_OTHER)
Admission: EM | Admit: 2024-08-03 | Discharge: 2024-08-06 | DRG: 314 | Disposition: A | Discharge status: Home / Self Care | Attending: Family Medicine | Admitting: Family Medicine

## 2024-08-03 ENCOUNTER — Telehealth: Payer: Self-pay

## 2024-08-03 ENCOUNTER — Encounter: Payer: Self-pay | Admitting: Internal Medicine

## 2024-08-03 DIAGNOSIS — Z87891 Personal history of nicotine dependence: Secondary | ICD-10-CM | POA: Diagnosis not present

## 2024-08-03 DIAGNOSIS — K750 Abscess of liver: Secondary | ICD-10-CM | POA: Diagnosis present

## 2024-08-03 DIAGNOSIS — E785 Hyperlipidemia, unspecified: Secondary | ICD-10-CM | POA: Diagnosis present

## 2024-08-03 DIAGNOSIS — D649 Anemia, unspecified: Secondary | ICD-10-CM | POA: Diagnosis present

## 2024-08-03 DIAGNOSIS — Z91013 Allergy to seafood: Secondary | ICD-10-CM | POA: Diagnosis not present

## 2024-08-03 DIAGNOSIS — Z888 Allergy status to other drugs, medicaments and biological substances status: Secondary | ICD-10-CM | POA: Diagnosis not present

## 2024-08-03 DIAGNOSIS — Z882 Allergy status to sulfonamides status: Secondary | ICD-10-CM

## 2024-08-03 DIAGNOSIS — Z91041 Radiographic dye allergy status: Secondary | ICD-10-CM

## 2024-08-03 DIAGNOSIS — M353 Polymyalgia rheumatica: Secondary | ICD-10-CM | POA: Diagnosis not present

## 2024-08-03 DIAGNOSIS — Z881 Allergy status to other antibiotic agents status: Secondary | ICD-10-CM

## 2024-08-03 DIAGNOSIS — B952 Enterococcus as the cause of diseases classified elsewhere: Secondary | ICD-10-CM | POA: Diagnosis present

## 2024-08-03 DIAGNOSIS — J4489 Other specified chronic obstructive pulmonary disease: Secondary | ICD-10-CM | POA: Diagnosis present

## 2024-08-03 DIAGNOSIS — Z823 Family history of stroke: Secondary | ICD-10-CM | POA: Diagnosis not present

## 2024-08-03 DIAGNOSIS — B965 Pseudomonas (aeruginosa) (mallei) (pseudomallei) as the cause of diseases classified elsewhere: Secondary | ICD-10-CM

## 2024-08-03 DIAGNOSIS — Z8679 Personal history of other diseases of the circulatory system: Secondary | ICD-10-CM

## 2024-08-03 DIAGNOSIS — T80211A Bloodstream infection due to central venous catheter, initial encounter: Secondary | ICD-10-CM | POA: Diagnosis present

## 2024-08-03 DIAGNOSIS — Z7952 Long term (current) use of systemic steroids: Secondary | ICD-10-CM | POA: Diagnosis not present

## 2024-08-03 DIAGNOSIS — L03113 Cellulitis of right upper limb: Secondary | ICD-10-CM | POA: Diagnosis present

## 2024-08-03 DIAGNOSIS — M315 Giant cell arteritis with polymyalgia rheumatica: Secondary | ICD-10-CM | POA: Diagnosis present

## 2024-08-03 DIAGNOSIS — Z87410 Personal history of cervical dysplasia: Secondary | ICD-10-CM

## 2024-08-03 DIAGNOSIS — Z792 Long term (current) use of antibiotics: Secondary | ICD-10-CM

## 2024-08-03 DIAGNOSIS — E663 Overweight: Secondary | ICD-10-CM | POA: Diagnosis present

## 2024-08-03 DIAGNOSIS — R7881 Bacteremia: Secondary | ICD-10-CM | POA: Diagnosis present

## 2024-08-03 DIAGNOSIS — T80212A Local infection due to central venous catheter, initial encounter: Secondary | ICD-10-CM | POA: Diagnosis present

## 2024-08-03 DIAGNOSIS — M316 Other giant cell arteritis: Secondary | ICD-10-CM | POA: Diagnosis present

## 2024-08-03 DIAGNOSIS — K219 Gastro-esophageal reflux disease without esophagitis: Secondary | ICD-10-CM | POA: Diagnosis present

## 2024-08-03 DIAGNOSIS — Y848 Other medical procedures as the cause of abnormal reaction of the patient, or of later complication, without mention of misadventure at the time of the procedure: Secondary | ICD-10-CM | POA: Diagnosis present

## 2024-08-03 DIAGNOSIS — Z803 Family history of malignant neoplasm of breast: Secondary | ICD-10-CM

## 2024-08-03 DIAGNOSIS — Z6827 Body mass index (BMI) 27.0-27.9, adult: Secondary | ICD-10-CM | POA: Diagnosis not present

## 2024-08-03 DIAGNOSIS — Z8261 Family history of arthritis: Secondary | ICD-10-CM

## 2024-08-03 DIAGNOSIS — T80219A Unspecified infection due to central venous catheter, initial encounter: Principal | ICD-10-CM | POA: Diagnosis present

## 2024-08-03 DIAGNOSIS — Z85820 Personal history of malignant melanoma of skin: Secondary | ICD-10-CM

## 2024-08-03 DIAGNOSIS — Z91018 Allergy to other foods: Secondary | ICD-10-CM

## 2024-08-03 DIAGNOSIS — B961 Klebsiella pneumoniae [K. pneumoniae] as the cause of diseases classified elsewhere: Secondary | ICD-10-CM | POA: Diagnosis present

## 2024-08-03 DIAGNOSIS — Z8349 Family history of other endocrine, nutritional and metabolic diseases: Secondary | ICD-10-CM

## 2024-08-03 DIAGNOSIS — B370 Candidal stomatitis: Secondary | ICD-10-CM | POA: Diagnosis present

## 2024-08-03 LAB — CBC WITH DIFFERENTIAL/PLATELET
Abs Immature Granulocytes: 0.08 K/uL — ABNORMAL HIGH (ref 0.00–0.07)
Basophils Absolute: 0.1 K/uL (ref 0.0–0.1)
Basophils Relative: 1 %
Eosinophils Absolute: 0.1 K/uL (ref 0.0–0.5)
Eosinophils Relative: 1 %
HCT: 38.6 % (ref 36.0–46.0)
Hemoglobin: 12.4 g/dL (ref 12.0–15.0)
Immature Granulocytes: 1 %
Lymphocytes Relative: 9 %
Lymphs Abs: 0.8 K/uL (ref 0.7–4.0)
MCH: 29.9 pg (ref 26.0–34.0)
MCHC: 32.1 g/dL (ref 30.0–36.0)
MCV: 93 fL (ref 80.0–100.0)
Monocytes Absolute: 0.4 K/uL (ref 0.1–1.0)
Monocytes Relative: 4 %
Neutro Abs: 7.2 K/uL (ref 1.7–7.7)
Neutrophils Relative %: 84 %
Platelets: 371 K/uL (ref 150–400)
RBC: 4.15 MIL/uL (ref 3.87–5.11)
RDW: 14.6 % (ref 11.5–15.5)
WBC: 8.6 K/uL (ref 4.0–10.5)
nRBC: 0 % (ref 0.0–0.2)

## 2024-08-03 LAB — COMPREHENSIVE METABOLIC PANEL WITH GFR
ALT: 186 U/L — ABNORMAL HIGH (ref 0–44)
AST: 118 U/L — ABNORMAL HIGH (ref 15–41)
Albumin: 4.6 g/dL (ref 3.5–5.0)
Alkaline Phosphatase: 557 U/L — ABNORMAL HIGH (ref 38–126)
Anion gap: 12 (ref 5–15)
BUN: 18 mg/dL (ref 6–20)
CO2: 28 mmol/L (ref 22–32)
Calcium: 10.2 mg/dL (ref 8.9–10.3)
Chloride: 101 mmol/L (ref 98–111)
Creatinine, Ser: 0.56 mg/dL (ref 0.44–1.00)
GFR, Estimated: >60 mL/min (ref >60)
Glucose, Bld: 103 mg/dL — ABNORMAL HIGH (ref 70–99)
Potassium: 4.2 mmol/L (ref 3.5–5.1)
Sodium: 140 mmol/L (ref 135–145)
Total Bilirubin: 0.4 mg/dL (ref 0.0–1.2)
Total Protein: 7.5 g/dL (ref 6.5–8.1)

## 2024-08-03 LAB — LACTIC ACID, PLASMA: Lactic Acid, Venous: 1.7 mmol/L (ref 0.5–1.9)

## 2024-08-03 LAB — SEDIMENTATION RATE: Sed Rate: 16 mm/h (ref 0–22)

## 2024-08-03 LAB — C-REACTIVE PROTEIN: CRP: 0.8 mg/dL (ref <1.0)

## 2024-08-03 MED ORDER — RISAQUAD PO CAPS
1.0000 | ORAL_CAPSULE | Freq: Two times a day (BID) | ORAL | Status: DC
  Administered 2024-08-04 – 2024-08-06 (×5): 1 via ORAL
  Filled 2024-08-03 (×7): qty 1

## 2024-08-03 MED ORDER — AMOXICILLIN 500 MG PO CAPS
1000.0000 mg | ORAL_CAPSULE | Freq: Once | ORAL | Status: AC
  Administered 2024-08-03: 1000 mg via ORAL
  Filled 2024-08-03: qty 2

## 2024-08-03 MED ORDER — SODIUM CHLORIDE 0.9 % IV SOLN: 250.0000 mL | INTRAVENOUS | Status: AC | PRN

## 2024-08-03 MED ORDER — AMOXICILLIN 500 MG PO CAPS
1000.0000 mg | ORAL_CAPSULE | Freq: Three times a day (TID) | ORAL | Status: DC
  Administered 2024-08-04 – 2024-08-06 (×8): 1000 mg via ORAL
  Filled 2024-08-03 (×12): qty 2

## 2024-08-03 MED ORDER — PREDNISONE 10 MG PO TABS
10.0000 mg | ORAL_TABLET | Freq: Every day | ORAL | Status: DC
  Administered 2024-08-04 – 2024-08-06 (×3): 10 mg via ORAL
  Filled 2024-08-03 (×3): qty 1

## 2024-08-03 MED ORDER — ONDANSETRON HCL 4 MG/2ML IJ SOLN
4.0000 mg | Freq: Four times a day (QID) | INTRAMUSCULAR | Status: DC | PRN
Start: 2024-08-03 — End: 2024-08-06

## 2024-08-03 MED ORDER — ACETAMINOPHEN 325 MG PO TABS: 650.0000 mg | ORAL_TABLET | Freq: Four times a day (QID) | ORAL | Status: DC | PRN

## 2024-08-03 MED ORDER — ACETAMINOPHEN 650 MG RE SUPP: 650.0000 mg | Freq: Four times a day (QID) | RECTAL | Status: DC | PRN

## 2024-08-03 MED ORDER — HYDROCODONE-ACETAMINOPHEN 5-325 MG PO TABS: 1.0000 | ORAL_TABLET | ORAL | Status: DC | PRN

## 2024-08-03 MED ORDER — SODIUM CHLORIDE 0.9 % IV SOLN
1.0000 g | Freq: Three times a day (TID) | INTRAVENOUS | Status: DC
  Administered 2024-08-03 – 2024-08-06 (×10): 1 g via INTRAVENOUS
  Filled 2024-08-03 (×9): qty 20

## 2024-08-03 MED ORDER — SODIUM CHLORIDE 0.9% FLUSH
3.0000 mL | INTRAVENOUS | Status: DC | PRN
  Administered 2024-08-03: 3 mL via INTRAVENOUS

## 2024-08-03 MED ORDER — SODIUM CHLORIDE 0.9% FLUSH
3.0000 mL | Freq: Two times a day (BID) | INTRAVENOUS | Status: DC
  Administered 2024-08-03 – 2024-08-06 (×4): 3 mL via INTRAVENOUS

## 2024-08-03 MED ORDER — ONDANSETRON HCL 4 MG PO TABS: 4.0000 mg | ORAL_TABLET | Freq: Four times a day (QID) | ORAL | Status: DC | PRN

## 2024-08-03 NOTE — Telephone Encounter (Signed)
 MRCP added to MR(11/21) Triage: please inform patient.

## 2024-08-03 NOTE — H&P (Signed)
 Laurie Terry FMW:[id number] DOB: May 23, 1966 DOA: 08/03/2024     PCP: [name], MD   Outpatient Specialists:  CARDS:   Dr. Stanly DELENA Leavens, MD  ID [facility] GI Dr.  [facility]god([place]   )    Patient arrived to ER on 08/03/24 at 1210 Referred by Attending Silvester Ales, MD   Patient coming from:    home Lives   With family     Chief Complaint:   Chief Complaint  Patient presents with   Vascular Access Problem    Redness around PICC    HPI: Laurie Terry is a 58 y.o. female with medical history significant of bacterial endocarditis, liver abscess temporal jealous arthritis, PMR on chronic steroids, Pseudomonas bacteremia, chronic anemia asthma, hyperlipidemia, paroxysmal SVT, liver abscess    Presented with   PICC line site redness Patient with complex medical history including giant cell arteritis and PMR on prednisone  comes in because of redness and swelling around PICC line site was told by IR to come get this pulled out  In August patient had an admission for thrombocytopenia secondary to infection and received 2 days of IVIG she was found to have Klebsiella bacteremia and liver abscess grew Enterococcus faecalis ID consulted and she was treated with daptomycin  and Rocephin  and Flagyl  and then discharged home on amoxicillin  and cefadroxil  for total 4 weeks  On 10 September patient had ultrasound for pneumobilia no gallstones There after she developed hypotension and had to be admitted   for Klebsiella bacteremia and Enterococcus liver abscess in September from September 14 to 22 resulting in severe sepsis CT scan of abdomen and pelvis shows several scattered lung nodules favoring multifocal cavitary pneumonia and scattered hypodense hepatic lesions She had an allergic reaction to cefepime   Underwent TEE on 17 September which showed small PFO she was treated with ampicillin  and meropenem  PICC line placed at that time for 6 weeks of antibiotics Plan  was to end on 29 October  Her daptomycin  and ampicillin  was stopped on 27 October and she was started on amoxicillin  1 g 3 times daily  In October patient followed up with pulmonology CT scan showed no evidence of residual cavitary disease  Regarding Liver abscess  CT CAP from 10.23.25 shows enlarging liver abscesses. CT reads Multiple hypodense liver lesions, some of which are enlarged compared to prior examination, for example in the central right lobe of the liver measuring 2.3 x 1.9 cm (series 2, image 69) and in the posterior right lobe of the liver measuring 2.6 x 2.0 cm (series 2, image 62). Others are diminished in size, for example in the anterior right lobe of the liver measuring 0.9 x 0.8 cm (series 2, image 63)   On November 3 patient undergone ultrasound aspiration of liver abscess So far no growth but abundant WBC present Patient was changed to meropenem   Today she called her ID doctor to let them know that the PICC line appears to have redness surrounded  ID recommended patient to come to emergency department to have this pulled. She had not had any fevers or chills PICC line is functioning properly Patient took her meropenem  today and has not missed any antibiotics     She was supposed to have her MRCP done next week but is wondering if can get it done while here  Denies significant ETOH intake   Does not smoke      Regarding pertinent Chronic problems:    PMR on prednisone     -  last echo  Recent Results (from the past 56199 hours)  ECHO TEE   Collection Time: 06/09/24 11:58 AM  Result Value   Est EF 60 - 65%   Narrative      TRANSESOPHOGEAL ECHO REPORT     IMPRESSIONS    1. Left ventricular ejection fraction, by estimation, is 60 to 65%. The left ventricle has normal function.  2. Right ventricular systolic function is normal. The right ventricular size is normal.  3. No left atrial/left atrial appendage thrombus was detected.  4. The mitral valve is  normal in structure. Mild mitral valve regurgitation.  5. The aortic valve is normal in structure. Aortic valve regurgitation is trivial.  6. Aortic dilatation noted. There is mild dilatation of the aortic root, measuring 42 mm. There is borderline dilatation of the ascending aorta, measuring 40 mm.  7. Evidence of atrial level shunting detected by color flow Doppler. There is a small patent foramen ovale with predominantly left to right shunting across the atrial septum.            Hyperthyroidism:   Lab Results  Component Value Date   TSH 0.311 (L) 05/24/2024       Asthma -well   controlled on home inhalers/ nebs                   Chronic anemia - baseline hg Hemoglobin & Hematocrit  Recent Labs    06/12/24 0538 07/26/24 1024 08/03/24 1420  HGB 10.8* 12.4 12.4   Iron/TIBC/Ferritin/ %Sat No results found for: IRON, TIBC, FERRITIN, IRONPCTSAT    Cancer - melanoma sp left great toe amputation  While in ER: Clinical Course as of 08/03/24 1918  Tue Aug 03, 2024  1454 Patient with complicated medical history including multi-species bacteremia, endocarditis, hepatic abscesses, GCA, PMR, multi-focal PNA. She is overall well appearing. Sent today by home health nurse for concern for infection related to PICC line.   VSS. Labs reassuring. There is alk phos elevation of 557, and transaminitis that are not new but slightly worse than when checked yesterday (comparable labs provided by patient). No leukocytosis, normal lactic acid. Cultures obtained.   Discussed with ID (both Drs. Manandhar and [place]). The plan will be to send the patient to [facility] ED to be seen by ID. Considerations will be IR replacement of PICC; admission and PICC replacement; antibiotic considerations.   Patient will go by transport. Discussed with [facility] ED providers and Charge RN, Camie Searle. Patient updated on plan and is agreeable to same.  [SU]    Clinical Course User Index [SU] Odell Balls, PA-C        Lab Orders         Culture, blood (routine x 2)         Cath Tip Culture         CBC with Differential         Comprehensive metabolic panel         C-reactive protein         Sedimentation rate       Following Medications were ordered in ER: Medications  meropenem  (MERREM ) 1 g in sodium chloride  0.9 % 100 mL IVPB (0 g Intravenous Stopped 08/03/24 1537)  amoxicillin  (AMOXIL ) capsule 1,000 mg (1,000 mg Oral Given 08/03/24 1457)    _______________________________________________________ ER Provider Called:     ID   Dr. Evelynn They Recommend admit to medicine   Will see in AM recommended PICC line removal  ED Triage Vitals  Encounter Vitals Group     BP 08/03/24 1228 117/73     Girls Systolic BP Percentile --      Girls Diastolic BP Percentile --      Boys Systolic BP Percentile --      Boys Diastolic BP Percentile --      Pulse Rate 08/03/24 1228 90     Resp 08/03/24 1228 19     Temp 08/03/24 1228 98.3 F (36.8 C)     Temp Source 08/03/24 1228 Oral     SpO2 08/03/24 1228 98 %     Weight 08/03/24 1232 163 lb (73.9 kg)     Height 08/03/24 1232 5' 5 (1.651 m)     Head Circumference --      Peak Flow --      Pain Score 08/03/24 1231 0     Pain Loc --      Pain Education --      Exclude from Growth Chart --   UFJK(75)@     _________________________________________ Significant initial  Findings: Abnormal Labs Reviewed  CBC WITH DIFFERENTIAL/PLATELET - Abnormal; Notable for the following components:      Result Value   Abs Immature Granulocytes 0.08 (*)    All other components within normal limits  COMPREHENSIVE METABOLIC PANEL WITH GFR - Abnormal; Notable for the following components:   Glucose, Bld 103 (*)    AST 118 (*)    ALT 186 (*)    Alkaline Phosphatase 557 (*)    All other components within normal limits      ECG: Ordered  BNP (last 3 results) No results for input(s): BNP in the last 8760 hours.    Recent Labs    08/03/24 1508  CRP  0.8        The recent clinical data is shown below. Vitals:   08/03/24 1232 08/03/24 1548 08/03/24 1629 08/03/24 1638  BP:  110/69 106/66   Pulse:  76 84   Resp:  18 11   Temp:   98.1 F (36.7 C) 98 F (36.7 C)  TempSrc:   Oral Oral  SpO2:  100% 99%   Weight: 73.9 kg     Height: 5' 5 (1.651 m)         WBC     Component Value Date/Time   WBC 8.6 08/03/2024 1420   LYMPHSABS 0.8 08/03/2024 1420   MONOABS 0.4 08/03/2024 1420   EOSABS 0.1 08/03/2024 1420   BASOSABS 0.1 08/03/2024 1420      Results for orders placed or performed during the hospital encounter of 07/26/24  Aerobic/Anaerobic Culture w Gram Stain (surgical/deep wound)     Status: None   Collection Time: 07/26/24 12:37 PM   Specimen: Abscess  Result Value Ref Range Status   Specimen Description ABSCESS  Final   Special Requests LIVER  Final   Gram Stain   Final    ABUNDANT WBC PRESENT, PREDOMINANTLY PMN NO ORGANISMS SEEN    Culture   Final    No growth aerobically or anaerobically. Performed at [facility], [address], [place], [place] [address]    Report Status 07/31/2024 FINAL  Final    ABX started Antibiotics Given (last 72 hours)     Date/Time Action Medication Dose Rate   08/03/24 1457 Given   amoxicillin  (AMOXIL ) capsule 1,000 mg 1,000 mg    08/03/24 1506 New Bag/Given   meropenem  (MERREM ) 1 g in sodium chloride  0.9 % 100  mL IVPB 1 g 200 mL/hr        Susceptibility data from last 90 days. Collected Specimen Info Organism AMPICILLIN  AMPICILLIN /SULBACTAM CEFAZOLIN  (NON-URINE) CEFEPIME  Ceftazidime CEFTAZIDIME/AVIBACTAM CEFTOLOZANE/TAZOBACTAM CEFTRIAXONE  Ciprofloxacin Ertapenem Gentamicin Susc lslt  06/06/24 Blood from BLOOD Pseudomonas aeruginosa     S  S  S  S   S    05/27/24 Abscess Enterococcus faecium  S            05/22/24 Blood from BLOOD Klebsiella pneumoniae  R  S  S  S     S  S  S  S   Collected Specimen Info Organism GENTAMICIN SYNERGY Imipenem  Meropenem  Piperacillin  +  Tazobactam TELAVANCIN Tobramycin Susc lslt Trimethoprim/Sulfa  06/06/24 Blood from BLOOD Pseudomonas aeruginosa   S  S    S   05/27/24 Abscess Enterococcus faecium  S     S    05/22/24 Blood from BLOOD Klebsiella pneumoniae    S S    S      __________________________________________________________ Recent Labs  Lab 08/03/24 1420  NA 140  K 4.2  CO2 28  GLUCOSE 103*  BUN 18  CREATININE 0.56  CALCIUM 10.2    Cr   stable,  Lab Results  Component Value Date   CREATININE 0.56 08/03/2024   CREATININE 0.64 06/12/2024   CREATININE 0.55 06/11/2024    Recent Labs  Lab 08/03/24 1420  AST 118*  ALT 186*  ALKPHOS 557*  BILITOT 0.4  PROT 7.5  ALBUMIN 4.6   Lab Results  Component Value Date   CALCIUM 10.2 08/03/2024   PHOS 3.2 04/12/2016        Plt: Lab Results  Component Value Date   PLT 371 08/03/2024       Recent Labs  Lab 08/03/24 1420  WBC 8.6  NEUTROABS 7.2  HGB 12.4  HCT 38.6  MCV 93.0  PLT 371    HG/HCT   stable,     Component Value Date/Time   HGB 12.4 08/03/2024 1420   HCT 38.6 08/03/2024 1420   MCV 93.0 08/03/2024 1420    _______________________________________________ Hospitalist was called for admission for   PICC line infection, initial encounter      The following Work up has been ordered so far:  Orders Placed This Encounter  Procedures   Culture, blood (routine x 2)   Cath Tip Culture   CBC with Differential   Comprehensive metabolic panel   C-reactive protein   Sedimentation rate   Cardiac Monitoring Continuous x 24 hours Indications for use: Other; Other indications for use: bacteremia   Consult to infectious diseases   Consult to hospitalist   EKG   Admit to Inpatient (patient's expected length of stay will be greater than 2 midnights or inpatient only procedure)     OTHER Significant initial  Findings:  labs showing:     DM  labs:  HbA1C: Recent Labs    05/24/24 0505  HGBA1C 6.4*       CBG (last 3)  No  results for input(s): GLUCAP in the last 72 hours.        Cultures:    Component Value Date/Time   SDES ABSCESS 07/26/2024 1237   SPECREQUEST LIVER 07/26/2024 1237   CULT  07/26/2024 1237    No growth aerobically or anaerobically. Performed at [facility], [address] [place], [place] [address]    REPTSTATUS 07/31/2024 FINAL 07/26/2024 1237     Radiological Exams on Admission: No results found.  _______________________________________________________________________________________________________ Latest  Blood pressure 106/66, pulse 84, temperature 98 F (36.7 C), temperature source Oral, resp. rate 11, height 5' 5 (1.651 m), weight 73.9 kg, SpO2 99%.   Vitals  labs and radiology finding personally reviewed  Review of Systems:    Pertinent positives include: redness around picc line  Constitutional:  No weight loss, night sweats, Fevers, chills, fatigue, weight loss  HEENT:  No headaches, Difficulty swallowing,Tooth/dental problems,Sore throat,  No sneezing, itching, ear ache, nasal congestion, post nasal drip,  Cardio-vascular:  No chest pain, Orthopnea, PND, anasarca, dizziness, palpitations.no Bilateral lower extremity swelling  GI:  No heartburn, indigestion, abdominal pain, nausea, vomiting, diarrhea, change in bowel habits, loss of appetite, melena, blood in stool, hematemesis Resp:  no shortness of breath at rest. No dyspnea on exertion, No excess mucus, no productive cough, No non-productive cough, No coughing up of blood.No change in color of mucus.No wheezing. Skin:  no rash or lesions. No jaundice GU:  no dysuria, change in color of urine, no urgency or frequency. No straining to urinate.  No flank pain.  Musculoskeletal:  No joint pain or no joint swelling. No decreased range of motion. No back pain.  Psych:  No change in mood or affect. No depression or anxiety. No memory loss.  Neuro: no localizing neurological complaints, no tingling, no  weakness, no double vision, no gait abnormality, no slurred speech, no confusion  All systems reviewed and apart from HOPI all are negative _______________________________________________________________________________________________ Past Medical History:   Past Medical History:  Diagnosis Date   Allergic rhinitis    Aortic aneurysm 09/23/2020   Cancer (HCC)    Melanoma   Cervical dysplasia 2007   low-grade SIL, normal Pap smears since then   Family history of adverse reaction to anesthesia    mom has h/o post op nausea/vomiting   Intermittent palpitations    IUD    inserted 03/2002.04/06/2007, 06/03/2012   Ocular migraine    Osteomalacia    Pancreatitis    Seafood allergy       Past Surgical History:  Procedure Laterality Date   BIOPSY  01/21/2022   Procedure: BIOPSY;  Surgeon: Dianna Specking, MD;  Location: [facility];  Service: Gastroenterology;;   ROMAYNE  2018   CHOLECYSTECTOMY N/A 04/09/2016   Procedure: LAPAROSCOPIC CHOLECYSTECTOMY ;  Surgeon: Donnice Bury, MD;  Location: [facility];  Service: General;  Laterality: N/A;   ERCP  04/11/2016   ERCP with pancreatic stent and failed CBD stent after needle knife and conventional sphincterotomy   ERCP N/A 04/12/2016   Procedure: ENDOSCOPIC RETROGRADE CHOLANGIOPANCREATOGRAPHY (ERCP);  Surgeon: Oliva Boots, MD;  Location: [facility] ENDOSCOPY;  Service: Endoscopy;  Laterality: N/A;   ERCP N/A 01/15/2022   Procedure: ENDOSCOPIC RETROGRADE CHOLANGIOPANCREATOGRAPHY (ERCP);  Surgeon: Boots Oliva, MD;  Location: [facility];  Service: Gastroenterology;  Laterality: N/A;   ESOPHAGOGASTRODUODENOSCOPY N/A 06/02/2024   Procedure: EGD (ESOPHAGOGASTRODUODENOSCOPY);  Surgeon: Burnette Fallow, MD;  Location: [facility];  Service: Gastroenterology;  Laterality: N/A;   EUS N/A 06/02/2024   Procedure: ULTRASOUND, UPPER GI TRACT, ENDOSCOPIC;  Surgeon: Burnette Fallow, MD;  Location: [facility];  Service: Gastroenterology;  Laterality: N/A;    FLEXIBLE SIGMOIDOSCOPY N/A 01/21/2022   Procedure: FLEXIBLE SIGMOIDOSCOPY;  Surgeon: Dianna Specking, MD;  Location: [facility];  Service: Gastroenterology;  Laterality: N/A;   FOOT SURGERY     toenail   INTRAUTERINE DEVICE INSERTION  08/07/2017   Mirena    IR EXCHANGE BILIARY DRAIN  02/11/2022   IR RADIOLOGIST EVAL & MGMT  02/08/2022   IR RADIOLOGIST EVAL & MGMT  02/20/2022   IR RADIOLOGIST EVAL & MGMT  02/28/2022   removal of melanoma     REMOVAL OF STONES  01/15/2022   Procedure: REMOVAL OF STONES;  Surgeon: Rosalie Kitchens, MD;  Location: [facility];  Service: Gastroenterology;;   ANNETT  01/15/2022   Procedure: ANNETT;  Surgeon: Rosalie Kitchens, MD;  Location: [facility];  Service: Gastroenterology;;   TRANSESOPHAGEAL ECHOCARDIOGRAM (CATH LAB) N/A 06/09/2024   Procedure: TRANSESOPHAGEAL ECHOCARDIOGRAM;  Surgeon: Francyne Headland, MD;  Location: [facility];  Service: Cardiovascular;  Laterality: N/A;    Social History:  Ambulatory   independently       reports that she has quit smoking. She has been exposed to tobacco smoke. She has never used smokeless tobacco. She reports that she does not currently use alcohol. She reports that she does not use drugs.     Family History:   Family History  Problem Relation Age of Onset   Tremor Mother    Stroke Father    Autoimmune disease Father    CVA Father    Breast cancer Sister 80       3X   Arthritis Sister        Psoriatic   Thyroid  disease Sister    Breast cancer Maternal Grandmother        Age 68   Thyroid  disease Daughter        Hypo   Uterine cancer Neg Hx    Bladder Cancer Neg Hx    Renal cancer Neg Hx    ______________________________________________________________________________________________ Allergies: Allergies  Allergen Reactions   Cefepime  Cough    Patient develops cough, rash in her ears. 06/07/24 developed extreme itching with dose, tongue swelling   Contrast Media [Iodinated Contrast  Media] Shortness Of Breath and Other (See Comments)    Low blood pressure    Midodrine  Shortness Of Breath   Avocado Itching and Other (See Comments)    Tongue and mouth.    Fish Allergy Hives and Swelling   Levofloxacin Hives and Other (See Comments)    Pt already has aortic aneurysm.   Pantoprazole  Other (See Comments)    Joint aches, fever, peeling of skin   Quinolones Other (See Comments)    Contraindicated due to history of Aortic Aneurysm    Shellfish Allergy Hives and Swelling   Sulfa Antibiotics Hives   Vancomycin  Rash, Other (See Comments) and Cough    Patient develops cough, rash ears, tingling lips after receiving Cefepime  and vancomycin  01/23/2022 - Patient had an allergy test and the MD stated that it is not a real allergy and that it was just an infusion reaction    Augmentin  [Amoxicillin -Pot Clavulanate] Nausea And Vomiting     Prior to Admission medications   Medication Sig Start Date End Date Taking? Authorizing Provider  acetaminophen  (TYLENOL ) 500 MG tablet Take 500 mg by mouth every 6 (six) hours as needed.    [provider]  amoxicillin  (AMOXIL ) 500 MG capsule Take 2 capsules (1,000 mg total) by mouth 3 (three) times daily. 07/19/24   Dennise Kingsley, MD  atovaquone  (MEPRON ) 750 MG/5ML suspension Take 750 mg by mouth 2 (two) times daily.    [provider]  COLESTID  1 g tablet Take 1-2 g by mouth 2 (two) times daily. 1 TO 2 TABLETS BY MOUTH NOT WITHIN AN HOUR OF OTHER MEDICINES TWICE DAILY. Patient not taking: Reported on 07/19/2024 06/04/24   [provider]  diclofenac   Sodium (VOLTAREN ) 1 % GEL Apply 2 g topically 4 (four) times daily. Patient not taking: Reported on 07/12/2024 05/30/24   Vann, Jessica U, DO  dicyclomine  (BENTYL ) 20 MG tablet Take 1 tablet (20 mg total) by mouth 2 (two) times daily. Patient not taking: Reported on 07/19/2024 05/07/24   [name], MD  EPINEPHrine  0.3 mg/0.3 mL IJ SOAJ injection Inject 0.3 mg into the  muscle as needed for anaphylaxis. 10/08/22   Tobie Arleta SQUIBB, MD  famotidine  (PEPCID ) 40 MG tablet Take 40 mg by mouth 2 (two) times daily. Patient taking differently: Take 40 mg by mouth 2 (two) times daily. Taking as needed    [provider]  fluticasone  (FLONASE ) 50 MCG/ACT nasal spray Place 2 sprays into both nostrils daily. Patient not taking: No sig reported 10/08/22   Tobie Arleta SQUIBB, MD  loperamide  (IMODIUM ) 2 MG capsule Take 1 capsule (2 mg total) by mouth every 4 (four) hours as needed for diarrhea or loose stools. Patient not taking: Reported on 07/19/2024 05/30/24   Vann, Jessica U, DO  PREDNISONE  PO Take 12.5 mg by mouth daily.    [provider]  saccharomyces boulardii (FLORASTOR) 250 MG capsule Take 1 capsule (250 mg total) by mouth 2 (two) times daily. 05/30/24   Juvenal Harlene PENNER, DO    ___________________________________________________________________________________________________ Physical Exam:    08/03/2024    4:29 PM 08/03/2024    3:48 PM 08/03/2024   12:32 PM  Vitals with BMI  Height   5' 5  Weight   163 lbs  BMI   27.12  Systolic 106 110   Diastolic 66 69   Pulse 84 76     1. General:  in No  Acute distress   Well  -appearing 2. Psychological: Alert and  Oriented 3. Head/ENT:   Dry Mucous Membranes                          Head Non traumatic, neck supple                         Poor Dentition 4. SKIN: decreased Skin turgor,  Skin clean Dry and intact no rash    5. Heart: Regular rate and rhythm no  Murmur, no Rub or gallop 6. Lungs: no wheezes or crackles   7. Abdomen: Soft, mildly tender, Non distended   obese   8. Lower extremities: no clubbing, cyanosis, no  edema 9. Neurologically Grossly intact, moving all 4 extremities equally   10. MSK: Normal range of motion    Chart has been reviewed  ______________________________________________________________________________________________  Assessment/Plan  58 y.o. female with medical  history significant of bacterial endocarditis, liver abscess temporal jealous arthritis, PMR on chronic steroids, Pseudomonas bacteremia, chronic anemia asthma, hyperlipidemia, paroxysmal SVT, liver abscess  Admitted for   PICC line infection, initial encounter     Present on Admission:  Bacteremia  GERD without esophagitis  Liver abscess  PMR (polymyalgia rheumatica)  Temporal giant cell arteritis (HCC)  PICC line infection     GERD without esophagitis Continue PPI  Liver abscess Continue meropenem  Will need discussed with ID if need repeat MRCP. Liver MRI  PMR (polymyalgia rheumatica) Continue prednisone  at 10 mg po q day  Temporal giant cell arteritis (HCC) Continue prednisone    PICC line infection Possible picc line infection will pull picc line  As per ID rec Send tip for culture  Repeat blood culture  Continue PO amoxicillin  and IV meropenem    Other plan as per orders.  DVT prophylaxis:  SCD      Code Status:    Code Status: Prior FULL CODE   as per patient   I had personally discussed CODE STATUS with patient    ACP   none    Family Communication:   Family not at  Bedside    Diet  heart healthy   Disposition Plan:    To home once workup is complete and patient is stable   Following barriers for discharge:                                                       Will need consultants to evaluate patient prior to discharge                                Consult Orders  (From admission, onward)           Start     Ordered   08/03/24 1743  Consult to hospitalist  Paged By Nichole  Once       Provider:  (Not yet assigned)  Question Answer Comment  Place call to: [facility]   Reason for Consult Admit      08/03/24 1742                       Consults called: ID consulted  Admission status:  ED Disposition     ED Disposition  Admit   Condition  --   Comment  Hospital Area: [facility] [[id number]]  Level of Care:  Telemetry [5]  May admit patient to [facility] or [facility] if equivalent level of care is available:: No  Diagnosis: Bacteremia [790.7.ICD-9-CM]  Admitting Physician: Avonte Sensabaugh [[id number]]  Attending Physician: Vondell Babers [[id number]]  Certification:: I certify this patient will need inpatient services for at least 2 midnights  Expected Medical Readiness: 08/05/2024             inpatient     I Expect 2 midnight stay secondary to severity of patient's current illness need for inpatient interventions justified by the following:    Severe lab/radiological/exam abnormalities including:    PICC line infection, initial encounter    and extensive comorbidities including:  COPD/asthma  liver disease   That are currently affecting medical management.   I expect  patient to be hospitalized for 2 midnights requiring inpatient medical care.  Patient is at high risk for adverse outcome (such as loss of life or disability) if not treated.  Indication for inpatient stay as follows:   Need for operative/procedural  intervention Bacteremia  Need for IV antibiotics, IV fluids,     Level of care     tele  For   24H     Lyllie Cobbins 08/03/2024, 8:31 PM     [facility]     after 2 AM please page floor coverage   If 7AM-7PM, please contact the day team taking care of the patient using [email or web address]

## 2024-08-03 NOTE — ED Triage Notes (Signed)
 Presents to ED with concern for redness and PICC insertion site. No other associated symptoms. States redness has spread for past few weeks.

## 2024-08-03 NOTE — ED Provider Notes (Signed)
 Patient was previously evaluated at [facility] and presents to the emergency department for ID evaluation.  See their note  In short, patient presents Emergency Department for evaluation of mild erythema, tenderness to PICC line.  Uses PICC line for meropenem  and ampicillin  administration for hepatic abscesses, bacteremia secondary to Pseudomonas. Was just DC on 9/22 for sepsis, bacteremia, septic emboli, cavitary PNA.  PICC line has been functioning properly with no missed doses of antibiotics.  ED workup today negative for severe sepsis.  No leukocytosis.  No fever nor tachycardia.  Lactic acid WNL.  Mild elevation in LFTs from baseline  Was provided meropenum and amox PTA  Physical Exam  BP 106/66 (BP Location: Left Arm)   Pulse 84   Temp 98 F (36.7 C) (Oral)   Resp 11   Ht 5' 5 (1.651 m)   Wt 73.9 kg   SpO2 99%   BMI 27.12 kg/m   Physical Exam Cardiovascular:     Pulses:          Radial pulses are 2+ on the right side.  Skin:    Comments: PICC line in RUE notable for mild erythema and tenderness at insertion site. No streaking, cellulitis, nor drainage. Sensation 2/2 of RUE     ED Course / MDM   Clinical Course as of 08/03/24 1722  Tue Aug 03, 2024  1454 Patient with complicated medical history including multi-species bacteremia, endocarditis, hepatic abscesses, GCA, PMR, multi-focal PNA. She is overall well appearing. Sent today by home health nurse for concern for infection related to PICC line.   VSS. Labs reassuring. There is alk phos elevation of 557, and transaminitis that are not new but slightly worse than when checked yesterday (comparable labs provided by patient). No leukocytosis, normal lactic acid. Cultures obtained.   Discussed with ID (both Drs. Manandhar and [place]). The plan will be to send the patient to [facility] ED to be seen by ID. Considerations will be IR replacement of PICC; admission and PICC replacement; antibiotic considerations.   Patient will  go by transport. Discussed with [facility] ED providers and Charge RN, Camie Searle. Patient updated on plan and is agreeable to same.  [SU]    Clinical Course User Index [SU] Odell Balls, PA-C   Medical Decision Making Amount and/or Complexity of Data Reviewed Labs: ordered.  Risk Prescription drug management. Decision regarding hospitalization.   Reached out to Dr. Holmes with ID vis secure chat who recommended PICC replacement, admission for infectious workup  Consulted hospitalist Dr. Silvester, and discussed ED workup, disposition and who accepts patient for admission   Laurie Tinnie BRAVO, PA 08/03/24 2330    Laurie Lamar BROCKS, MD 08/04/24 1050

## 2024-08-03 NOTE — Subjective & Objective (Addendum)
 Patient with complex medical history including giant cell arteritis and PMR on prednisone  comes in because of redness and swelling around PICC line site was told by IR to come get this pulled out  In August patient had an admission for thrombocytopenia secondary to infection and received 2 days of IVIG she was found to have Klebsiella bacteremia and liver abscess grew Enterococcus faecalis ID consulted and she was treated with daptomycin  and Rocephin  and Flagyl  and then discharged home on amoxicillin  and cefadroxil  for total 4 weeks  On 10 September patient had ultrasound for pneumobilia no gallstones There after she developed hypotension and had to be admitted   for Klebsiella bacteremia and Enterococcus liver abscess in September from September 14 to 22 resulting in severe sepsis CT scan of abdomen and pelvis shows several scattered lung nodules favoring multifocal cavitary pneumonia and scattered hypodense hepatic lesions She had an allergic reaction to cefepime   Underwent TEE on 17 September which showed small PFO she was treated with ampicillin  and meropenem  PICC line placed at that time for 6 weeks of antibiotics Plan was to end on 29 October  Her daptomycin  and ampicillin  was stopped on 27 October and she was started on amoxicillin  1 g 3 times daily  In October patient followed up with pulmonology CT scan showed no evidence of residual cavitary disease  Regarding Liver abscess  CT CAP from 10.23.25 shows enlarging liver abscesses. CT reads Multiple hypodense liver lesions, some of which are enlarged compared to prior examination, for example in the central right lobe of the liver measuring 2.3 x 1.9 cm (series 2, image 69) and in the posterior right lobe of the liver measuring 2.6 x 2.0 cm (series 2, image 62). Others are diminished in size, for example in the anterior right lobe of the liver measuring 0.9 x 0.8 cm (series 2, image 63)   On November 3 patient undergone ultrasound  aspiration of liver abscess So far no growth but abundant WBC present Patient was changed to meropenem   Today she called her ID doctor to let them know that the PICC line appears to have redness surrounded  ID recommended patient to come to emergency department to have this pulled. She had not had any fevers or chills PICC line is functioning properly Patient took her meropenem  today and has not missed any antibiotics

## 2024-08-03 NOTE — ED Notes (Signed)
 [facility] arrived to transport pt to [facility] ED. Dr. Ruthe receiving MD. VSS.

## 2024-08-03 NOTE — Assessment & Plan Note (Signed)
 Continue PPI.

## 2024-08-03 NOTE — Telephone Encounter (Signed)
 Spoke to Ad[place] with [facility] and informed her the test had been added.   [phone]

## 2024-08-03 NOTE — ED Triage Notes (Signed)
 PT BIB [facility] from [facility] with reddened and swollen, possibly infected PICC line.Received ABX @ [facility] through peripheral IV.

## 2024-08-03 NOTE — Assessment & Plan Note (Signed)
 Possible picc line infection will pull picc line  As per ID rec Send tip for culture  Repeat blood culture Continue PO amoxicillin  and IV meropenem 

## 2024-08-03 NOTE — Assessment & Plan Note (Signed)
 Continue meropenem  Will need discussed with ID if need repeat MRCP. Liver MRI

## 2024-08-03 NOTE — ED Notes (Signed)
 Called Kim at [facility] for transport: 15:05, TC

## 2024-08-03 NOTE — ED Provider Notes (Signed)
 [facility]   CSN: [id number] Arrival date & time: 08/03/24  1210     Patient presents with: Vascular Access Problem (Redness around PICC)   Laurie Terry is a 58 y.o. female.   Patient with complicated recent medical history that includes diagnosis of GCA and polymyalgia rheumatica in August started on high dose steroids which have now been decreased, admission 8/29 for Pseudomonas bacteremia, Hepatic abscesses, multifocal cavitary PNA, bacterial endocarditis,ultimately going home with PICC line in place for continuation of meropenum and ampicillin . Repeat CT CAP 10/23 showed improvement in pulmonary infection and worsening hepatic abscesses. She presents today with redness and soreness at the PICC line site in the upper right arm. No fever. She feels well. The PICC if functioning properly and she has not missed any doses of her antibiotics.   The history is provided by the patient and medical records. No language interpreter was used.       Prior to Admission medications   Medication Sig Start Date End Date Taking? Authorizing Provider  acetaminophen  (TYLENOL ) 500 MG tablet Take 500 mg by mouth every 6 (six) hours as needed.    [provider]  amoxicillin  (AMOXIL ) 500 MG capsule Take 2 capsules (1,000 mg total) by mouth 3 (three) times daily. 07/19/24   Laurie Kingsley, MD  atovaquone  (MEPRON ) 750 MG/5ML suspension Take 750 mg by mouth 2 (two) times daily.    [provider]  COLESTID  1 g tablet Take 1-2 g by mouth 2 (two) times daily. 1 TO 2 TABLETS BY MOUTH NOT WITHIN AN HOUR OF OTHER MEDICINES TWICE DAILY. Patient not taking: Reported on 07/19/2024 06/04/24   [provider]  diclofenac  Sodium (VOLTAREN ) 1 % GEL Apply 2 g topically 4 (four) times daily. Patient not taking: Reported on 07/12/2024 05/30/24   Vann, Jessica U, DO  dicyclomine  (BENTYL ) 20 MG tablet Take 1 tablet (20 mg total) by  mouth 2 (two) times daily. Patient not taking: Reported on 07/19/2024 05/07/24   [name], MD  EPINEPHrine  0.3 mg/0.3 mL IJ SOAJ injection Inject 0.3 mg into the muscle as needed for anaphylaxis. 10/08/22   Tobie Arleta SQUIBB, MD  famotidine  (PEPCID ) 40 MG tablet Take 40 mg by mouth 2 (two) times daily. Patient taking differently: Take 40 mg by mouth 2 (two) times daily. Taking as needed    [provider]  fluticasone  (FLONASE ) 50 MCG/ACT nasal spray Place 2 sprays into both nostrils daily. Patient not taking: No sig reported 10/08/22   Tobie Arleta SQUIBB, MD  loperamide  (IMODIUM ) 2 MG capsule Take 1 capsule (2 mg total) by mouth every 4 (four) hours as needed for diarrhea or loose stools. Patient not taking: Reported on 07/19/2024 05/30/24   Vann, Jessica U, DO  PREDNISONE  PO Take 12.5 mg by mouth daily.    [provider]  saccharomyces boulardii (FLORASTOR) 250 MG capsule Take 1 capsule (250 mg total) by mouth 2 (two) times daily. 05/30/24   Vann, Jessica U, DO    Allergies: Cefepime , Contrast media [iodinated contrast media], Midodrine , Avocado, Fish allergy, Levofloxacin, Pantoprazole , Quinolones, Shellfish allergy, Sulfa antibiotics, Vancomycin , and Augmentin  [amoxicillin -pot clavulanate]    Review of Systems  Updated Vital Signs BP 117/73 (BP Location: Left Arm)   Pulse 90   Temp 98.3 F (36.8 C) (Oral)   Resp 19   Ht 5' 5 (1.651 m)   Wt 73.9 kg   SpO2 98%   BMI 27.12 kg/m  Physical Exam Constitutional:      Appearance: She is well-developed.  HENT:     Head: Normocephalic.  Cardiovascular:     Rate and Rhythm: Normal rate.     Pulses: Normal pulses.  Pulmonary:     Effort: Pulmonary effort is normal.     Breath sounds: No wheezing.  Abdominal:     General: Bowel sounds are normal.     Palpations: Abdomen is soft.     Tenderness: There is no abdominal tenderness. There is no guarding or rebound.  Musculoskeletal:        General: No swelling. Normal  range of motion.     Cervical back: Normal range of motion and neck supple.  Skin:    General: Skin is warm and dry.     Findings: Erythema present.     Comments: Erythema at PICC insertion site of the upper right arm. No swelling. There is focal tenderness at the site but no proximal or distal tenderness.   Neurological:     General: No focal deficit present.     Mental Status: She is alert and oriented to person, place, and time.     (all labs ordered are listed, but only abnormal results are displayed) Labs Reviewed  CBC WITH DIFFERENTIAL/PLATELET - Abnormal; Notable for the following components:      Result Value   Abs Immature Granulocytes 0.08 (*)    All other components within normal limits  COMPREHENSIVE METABOLIC PANEL WITH GFR - Abnormal; Notable for the following components:   Glucose, Bld 103 (*)    AST 118 (*)    ALT 186 (*)    Alkaline Phosphatase 557 (*)    All other components within normal limits  CULTURE, BLOOD (ROUTINE X 2)  CULTURE, BLOOD (ROUTINE X 2)  LACTIC ACID, PLASMA  LACTIC ACID, PLASMA  C-REACTIVE PROTEIN  SEDIMENTATION RATE    EKG: None  Radiology: No results found.   Procedures   Medications Ordered in the ED  meropenem  (MERREM ) 1 g in sodium chloride  0.9 % 100 mL IVPB (has no administration in time range)  amoxicillin  (AMOXIL ) capsule 1,000 mg (1,000 mg Oral Given 08/03/24 1457)    Clinical Course as of 08/03/24 1459  Tue Aug 03, 2024  1454 Patient with complicated medical history including multi-species bacteremia, endocarditis, hepatic abscesses, GCA, PMR, multi-focal PNA. She is overall well appearing. Sent today by home health nurse for concern for infection related to PICC line.   VSS. Labs reassuring. There is alk phos elevation of 557, and transaminitis that are not new but slightly worse than when checked yesterday (comparable labs provided by patient). No leukocytosis, normal lactic acid. Cultures obtained.   Discussed with  ID (both Drs. Manandhar and [place]). The plan will be to send the patient to [facility] ED to be seen by ID. Considerations will be IR replacement of PICC; admission and PICC replacement; antibiotic considerations.   Patient will go by transport. Discussed with [facility] ED providers and Charge RN, Camie Searle. Patient updated on plan and is agreeable to same.  [SU]    Clinical Course User Index [SU] Odell Balls, PA-C                                 Medical Decision Making Amount and/or Complexity of Data Reviewed Labs: ordered.  Risk Prescription drug management.        Final diagnoses:  PICC line infection,  initial encounter    ED Discharge Orders     None          Odell Balls, PA-C 08/03/24 1500    Jerrol Agent, MD 08/03/24 [phone]

## 2024-08-03 NOTE — Assessment & Plan Note (Signed)
 -  Continue prednisone

## 2024-08-03 NOTE — Assessment & Plan Note (Signed)
 Continue prednisone  at 10 mg po q day

## 2024-08-03 NOTE — ED Notes (Signed)
 Dietary services called to place a meal tray for the pt.

## 2024-08-03 NOTE — Telephone Encounter (Signed)
 Patient called to inform provider that there is some increase redness at picc insertion site. Area is sensitive when touched. Denies any swelling or persistent pain.  Advised pt to monitor area for now. If she develops swelling, fever, or drainage at site to go to ED for evaluation. Will send picture on mychart for provider to review.  Did note she had dressing change yesterday by [facility] and Rn noted redness as well. Lorenda CHRISTELLA Code, RMA

## 2024-08-04 DIAGNOSIS — M316 Other giant cell arteritis: Secondary | ICD-10-CM | POA: Diagnosis not present

## 2024-08-04 DIAGNOSIS — T80219A Unspecified infection due to central venous catheter, initial encounter: Secondary | ICD-10-CM | POA: Diagnosis not present

## 2024-08-04 DIAGNOSIS — M353 Polymyalgia rheumatica: Secondary | ICD-10-CM | POA: Diagnosis not present

## 2024-08-04 DIAGNOSIS — K219 Gastro-esophageal reflux disease without esophagitis: Secondary | ICD-10-CM | POA: Diagnosis not present

## 2024-08-04 LAB — CBC
HCT: 36.4 % (ref 36.0–46.0)
Hemoglobin: 11.9 g/dL — ABNORMAL LOW (ref 12.0–15.0)
MCH: 30.3 pg (ref 26.0–34.0)
MCHC: 32.7 g/dL (ref 30.0–36.0)
MCV: 92.6 fL (ref 80.0–100.0)
Platelets: 347 K/uL (ref 150–400)
RBC: 3.93 MIL/uL (ref 3.87–5.11)
RDW: 14.6 % (ref 11.5–15.5)
WBC: 8 K/uL (ref 4.0–10.5)
nRBC: 0 % (ref 0.0–0.2)

## 2024-08-04 LAB — COMPREHENSIVE METABOLIC PANEL WITH GFR
ALT: 131 U/L — ABNORMAL HIGH (ref 0–44)
AST: 88 U/L — ABNORMAL HIGH (ref 15–41)
Albumin: 2.9 g/dL — ABNORMAL LOW (ref 3.5–5.0)
Alkaline Phosphatase: 367 U/L — ABNORMAL HIGH (ref 38–126)
Anion gap: 13 (ref 5–15)
BUN: 15 mg/dL (ref 6–20)
CO2: 25 mmol/L (ref 22–32)
Calcium: 8.8 mg/dL — ABNORMAL LOW (ref 8.9–10.3)
Chloride: 103 mmol/L (ref 98–111)
Creatinine, Ser: 0.61 mg/dL (ref 0.44–1.00)
GFR, Estimated: >60 mL/min (ref >60)
Glucose, Bld: 84 mg/dL (ref 70–99)
Potassium: 3.6 mmol/L (ref 3.5–5.1)
Sodium: 141 mmol/L (ref 135–145)
Total Bilirubin: 0.6 mg/dL (ref 0.0–1.2)
Total Protein: 5.9 g/dL — ABNORMAL LOW (ref 6.5–8.1)

## 2024-08-04 LAB — MAGNESIUM: Magnesium: 2.3 mg/dL (ref 1.7–2.4)

## 2024-08-04 LAB — PHOSPHORUS: Phosphorus: 3.2 mg/dL (ref 2.5–4.6)

## 2024-08-04 MED ORDER — FAMOTIDINE 20 MG PO TABS
40.0000 mg | ORAL_TABLET | Freq: Two times a day (BID) | ORAL | Status: DC
  Administered 2024-08-05 – 2024-08-06 (×2): 40 mg via ORAL
  Filled 2024-08-04 (×4): qty 2

## 2024-08-04 MED ORDER — FAMOTIDINE 20 MG PO TABS: 20.0000 mg | ORAL_TABLET | Freq: Every day | ORAL | Status: DC | PRN

## 2024-08-04 MED ORDER — NYSTATIN 100000 UNIT/ML MT SUSP
5.0000 mL | Freq: Four times a day (QID) | OROMUCOSAL | Status: DC
  Administered 2024-08-04 – 2024-08-06 (×9): 500000 [IU] via ORAL
  Filled 2024-08-04 (×9): qty 5

## 2024-08-04 NOTE — Progress Notes (Signed)
 SATURATION QUALIFICATIONS: (This note is used to comply with regulatory documentation for home oxygen)  Patient Saturations on Room Air at Rest = 100%  Patient Saturations on Room Air while Ambulating = 100%  Patient Saturations on 0 Liters of oxygen while Ambulating = 100%  Please briefly explain why patient needs home oxygen:  Patient does not have any needs for O2 at this time

## 2024-08-04 NOTE — Consult Note (Signed)
 [facility]    Date of Admission:  08/03/2024    Reason for Consult: Possible PICC Line Infection. On Prolonged Antibiotics for Hepatic Abscesses    Referring Provider: Blease Quiver, MD  Assessment and Plan: Possible PICC Line Infection Hepatic Abscesses  Thrush - Nystatin  swish and swallow   - PICC line has been removed. Blood cultures are pending - Recommend continuing IV Meropenem  and PO Amoxicillin  - Started Nystatin  swish and swallow for her thrush  - Repeat MRI and MRCP on August 13, 2024 with ID follow up afterwards - Further recommendations to follow pending culture results   Principal Problem:   Bacteremia Active Problems:   GERD without esophagitis   PMR (polymyalgia rheumatica)   Liver abscess   Temporal giant cell arteritis (HCC)   PICC line infection   acidophilus  1 capsule Oral BID   amoxicillin   1,000 mg Oral TID   nystatin   5 mL Oral QID   predniSONE   10 mg Oral Q breakfast   sodium chloride  flush  3 mL Intravenous Q12H   HPI: Laurie [facility] is a 58 year old female with past medical history of giant cell arteritis and PMR that was diagnosed in June 2025 and she was started on high dose Prednisone . She was then admitted to the hospital August 27th - September 9th 2025 with Klebsiella bacteremia that was thought to be secondary to a GI source. At that time, she was found to have hepatic abscesses and underwent IR aspiration with cultures growing E. faecium, otitis pubis so she was discharged on Amoxicillin  and Cefadroxil . She then followed up in the GI clinic on September 14th and was found to be hypotensive. Work up at that time showed that the patient now had Pseudomonas bacteremia. CT chest, abdomen and pelvis showed several scattered lung nodules which were new compared to CT on 8/15.  Several scattered hypodense hepatic lesions observed, noted to have increased in size from 1.6 X1 0.4-1.9 X1 0.7 cm compared to previous  MRI September 3rd previous pelvic ascites resolved. TEE without vegetation, small PFO. She was then discharged on September 22nd on Meropenem  and Ampicillin  for 6 weeks with a stop date of October 29th. The patient was then seen in the ID clinic in follow up on October 27th and was complaining of loose stools on and off. She was negative for Cdiff at her GI follow up. Denied any abdominal pain but said she had a productive cough. Repeat CT chest, abdomen and pelvis on October 23rd showed multiple hypodense liver lesions, some of which are enlarged in comparison to prior exams and others are diminished in size. Enlarged lesions are concerning for persistent/progressing hepatic abscesses. Previously seen bilateral cavitary pulmonary nodules are almost completely resolved. Plan during her ID follow up was to order a MRI of her abdomen and MRCP with extension of her antibiotics until results from her MRIs were obtained. Her Ampicillin  was switched to Amoxicillin  given her loose stools.   The patient now presents with a possible PICC line infection. PICC line was removed November 11th. Blood cultures were obtained and are pending. The patient is currently on Meropenem  and PO Amoxicillin     Review of Systems: Review of Systems  Constitutional:  Negative for chills and fever.  HENT:  Negative for sore throat.   Eyes:  Negative for blurred vision.  Respiratory:  Negative for cough and shortness of breath.   Cardiovascular:  Positive for chest pain. Negative for  leg swelling.  Gastrointestinal:  Positive for abdominal pain and diarrhea. Negative for constipation, nausea and vomiting.  Genitourinary:  Negative for dysuria.  Musculoskeletal:  Negative for joint pain.  Skin:  Negative for itching and rash.  Neurological:  Negative for focal weakness and weakness.   Past Medical History:  Diagnosis Date   Allergic rhinitis    Aortic aneurysm 09/23/2020   Cancer (HCC)    Melanoma   Cervical dysplasia 2007    low-grade SIL, normal Pap smears since then   Family history of adverse reaction to anesthesia    mom has h/o post op nausea/vomiting   Intermittent palpitations    IUD    inserted 03/2002.04/06/2007, 06/03/2012   Ocular migraine    Osteomalacia    Pancreatitis    Seafood allergy     Social History   Tobacco Use   Smoking status: Former    Passive exposure: Past   Smokeless tobacco: Never   Tobacco comments:    Patient said a long time ago. 07/19/2024  Vaping Use   Vaping status: Never Used  Substance Use Topics   Alcohol use: Not Currently   Drug use: No   Family History  Problem Relation Age of Onset   Tremor Mother    Stroke Father    Autoimmune disease Father    CVA Father    Breast cancer Sister 25       3X   Arthritis Sister        Psoriatic   Thyroid  disease Sister    Breast cancer Maternal Grandmother        Age 8   Thyroid  disease Daughter        Hypo   Uterine cancer Neg Hx    Bladder Cancer Neg Hx    Renal cancer Neg Hx    Allergies  Allergen Reactions   Cefepime  Cough    Patient develops cough, rash in her ears. 06/07/24 developed extreme itching with dose, tongue swelling   Contrast Media [Iodinated Contrast Media] Shortness Of Breath and Other (See Comments)    Low blood pressure    Midodrine  Shortness Of Breath   Avocado Itching and Other (See Comments)    Tongue and mouth.    Fish Allergy Hives and Swelling   Levofloxacin Hives and Other (See Comments)    Pt already has aortic aneurysm.   Pantoprazole  Other (See Comments)    Joint aches, fever, peeling of skin   Quinolones Other (See Comments)    Contraindicated due to history of Aortic Aneurysm    Shellfish Allergy Hives and Swelling   Sulfa Antibiotics Hives   Vancomycin  Rash, Other (See Comments) and Cough    Patient develops cough, rash ears, tingling lips after receiving Cefepime  and vancomycin  01/23/2022 - Patient had an allergy test and the MD stated that it is not a real allergy  and that it was just an infusion reaction    Augmentin  [Amoxicillin -Pot Clavulanate] Nausea And Vomiting   OBJECTIVE: Blood pressure 93/71, pulse 92, temperature 98.6 F (37 C), resp. rate 16, height 5' 5 (1.651 m), weight 73.9 kg, SpO2 96%.  General: Well developed, well nourished female in no apparent distress HENT: Moist mucous membranes, normal nose, normal external ears, and normocephalic Neck: Supple, trachea midline, and normal cervical range of motion Eyes: PERRL, EOMI, non-icteric, and normal conjunctivae and lids Lungs: Clear to auscultation bilaterally. No wheezing, rales or rhonchi Cardiac: Regular rate and rhythm. No murmurs, rubs or gallops. No peripheral edema Abdomen:  Soft, Non distended, Non tender, active bowel sounds Skin: Intact, no focal erythema or rash, and warm and dry. Right upper arm PICC line insertion site is without erythema, signs of infection or drainage  GU: Defered genital exam Musculoskeletal: No obvious skeletal abnormalities Neuro: Alert, no focal neurologic deficits, moves all extremities Psych: Oriented x 3, cooperative  Lab Results Lab Results  Component Value Date   WBC 8.0 08/04/2024   HGB 11.9 (L) 08/04/2024   HCT 36.4 08/04/2024   MCV 92.6 08/04/2024   PLT 347 08/04/2024    Lab Results  Component Value Date   CREATININE 0.61 08/04/2024   BUN 15 08/04/2024   NA 141 08/04/2024   K 3.6 08/04/2024   CL 103 08/04/2024   CO2 25 08/04/2024    Lab Results  Component Value Date   ALT 131 (H) 08/04/2024   AST 88 (H) 08/04/2024   ALKPHOS 367 (H) 08/04/2024   BILITOT 0.6 08/04/2024    Microbiology: Recent Results (from the past 240 hours)  Aerobic/Anaerobic Culture w Gram Stain (surgical/deep wound)     Status: None   Collection Time: 07/26/24 12:37 PM   Specimen: Abscess  Result Value Ref Range Status   Specimen Description ABSCESS  Final   Special Requests LIVER  Final   Gram Stain   Final    ABUNDANT WBC PRESENT, PREDOMINANTLY  PMN NO ORGANISMS SEEN    Culture   Final    No growth aerobically or anaerobically. Performed at [facility], [address] [place], [place] [address]    Report Status 07/31/2024 FINAL  Final  Culture, blood (routine x 2)     Status: None (Preliminary result)   Collection Time: 08/03/24  2:09 PM   Specimen: BLOOD  Result Value Ref Range Status   Specimen Description   Final    BLOOD LEFT ANTECUBITAL Performed at [facility], [address] [place], [place] [address]    Special Requests   Final    BOTTLES DRAWN AEROBIC AND ANAEROBIC Blood Culture adequate volume Performed at [facility], [address], [place], [place] [address]    Culture   Final    NO GROWTH < 12 HOURS Performed at [facility], [address], [place], [place] [address]    Report Status PENDING  Incomplete  Culture, blood (routine x 2)     Status: None (Preliminary result)   Collection Time: 08/03/24  6:09 PM   Specimen: BLOOD  Result Value Ref Range Status   Specimen Description BLOOD SITE NOT SPECIFIED  Final   Special Requests   Final    BOTTLES DRAWN AEROBIC AND ANAEROBIC Blood Culture results may not be optimal due to an inadequate volume of blood received in culture bottles   Culture   Final    NO GROWTH < 12 HOURS Performed at [facility], [address], [place], [place] [address]    Report Status PENDING  Incomplete   Evalene Munch, MD [facility]  08/04/2024 5:07 PM

## 2024-08-04 NOTE — Progress Notes (Signed)
 PROGRESS NOTE    Laurie Terry  FMW:[id number] DOB: [birth year] DOA: 08/03/2024 PCP: [name], MD   Brief Narrative:  The patient is a 58 year old overweight Caucasian female with past medical history significant for but not limited to recent diagnosis of giant cell arteritis and PMR was placed on high-dose steroids.  Found to have a Klebsiella bacteremia subsequently after her hospitalization from August 27 through September ninth and then also found to have hepatic abscesses.  Has a PICC line and getting long-term antibiotics with IV meropenem  and amoxicillin  p.o..  Called her ID doctor yesterday and was found of increased redness at her PICC insertion site.  The site continue to worsen and she had redness so she came to the ED and transferred to [facility] for possible PICC line infection.  ID is consulted recommending discontinuing the PICC line and monitoring cultures.  Assessment and Plan:  PICC line infection w/ ? Bacteremia: Possible picc line infection and PICC line discontinued per ID ID rec; Send tip for culture and Repeated Blood Cx which are pending. Continue PO amoxicillin  and IV meropenem . ID consulted and   Thrush: C/w Nystatin  500,000 Swish and Swallow  Liver/Hepatic abscesses: Continue IV Meropenem ; Repeat MRI/MRCP as an outpatient   Hx of Klebsiella Bacteremia: ID consulted and was placed on amoxicillin  and cefadroxil  and subsequently changed to meropenem  and ampicillin  for 6 weeks as the hepatic abscesses were found.  She had repeat CT of the chest abdomen pelvis on October 23 which showed multiple hypodense liver lesions some of which were enlarged in comparison to the prior exam and these no large liver lesions were concerning for persistent progressing hepatic abscesses.  Current plan is for outpatient follow-up with MRI of the abdomen and MRCP and this is scheduled for 08/13/2024   PMR (polymyalgia rheumatica): Continue prednisone  at 10 mg po q  day; Recently diagnosed in June   Temporal giant cell arteritis (HCC): Continue Prednisone  10 mg po Daily and is on a prolonged Taper; sees Dr. Jon Jacob as rheumatologist   Abnormal LFTs: In the setting of Liver Abscesses and Steroids; LFT Trend:  Recent Labs  Lab 08/03/24 1420 08/04/24 0422  AST 118* 88*  ALT 186* 131*  BILITOT 0.4 0.6  ALKPHOS 557* 367*  -CTM and if worsens considering RUQ U/S and Acute Hepatitis Panel. Patient has outpatient MRI/MRCP scheduled for 08/13/24. Follow closely and repeat CMP w/in 1 week  Normocytic Anemia: Hgb/Hct Trend:  Recent Labs  Lab 07/26/24 1024 08/03/24 1420 08/04/24 0422  HGB 12.4 12.4 11.9*  HCT 39.0 38.6 36.4  MCV 93.8 93.0 92.6  -Check Anemia Panel in the AM. CTM for S/Sx of Bleeding; No overt bleeding noted. Repeat CBC in the AM  GERD without esophagitis: Continue Famotidine  given Allergy to PPI   Overweight: Complicates overall prognosis and care. Estimated body mass index is 27.12 kg/m as calculated from the following:   Height as of this encounter: 5' 5 (1.651 m).   Weight as of this encounter: 73.9 kg. Weight Loss and Dietary Counseling given   DVT prophylaxis: SCDs Start: 08/03/24 2028    Code Status: Full Code Family Communication: No family at bedside  Disposition Plan:  Level of care: Telemetry Status is: Inpatient Remains inpatient appropriate because: His further clinical improvement and clearance by the specialist   Consultants:  Infectious diseases  Procedures:  As delineated as above  Antimicrobials:  Anti-infectives (From admission, onward)    Start     Dose/Rate Route  Frequency Ordered Stop   08/03/24 2215  amoxicillin  (AMOXIL ) capsule 1,000 mg        1,000 mg Oral 3 times daily 08/03/24 2119     08/03/24 1430  meropenem  (MERREM ) 1 g in sodium chloride  0.9 % 100 mL IVPB        1 g 200 mL/hr over 30 Minutes Intravenous Every 8 hours 08/03/24 1428     08/03/24 1430  amoxicillin  (AMOXIL ) capsule  1,000 mg        1,000 mg Oral  Once 08/03/24 1428 08/03/24 1457       Subjective: Seen and examined at bedside and is complaining a little bit of soreness where her PICC was.  Also complaining of some mild chest discomfort and feels like she has sharp stabbing pains but attributes it to possible esophageal thrush.  No lightheadedness or dizziness.  Feels okay otherwise and happy that her LFTs are slightly improved.  No other concerns or complaints at this time.  Objective: Vitals:   08/04/24 0414 08/04/24 0731 08/04/24 1541 08/04/24 2019  BP: 104/64 99/66 93/71  103/64  Pulse: 69 80 92 76  Resp: 18 19 16 18   Temp: 98.1 F (36.7 C) 97.8 F (36.6 C) 98.6 F (37 C)   TempSrc:      SpO2: 97% 99% 96%   Weight:      Height:        Intake/Output Summary (Last 24 hours) at 08/04/2024 2037 Last data filed at 08/04/2024 1300 Gross per 24 hour  Intake 126 ml  Output --  Net 126 ml   Filed Weights   08/03/24 1232  Weight: 73.9 kg   Examination: Physical Exam:  Constitutional: WN/WD overweight Caucasian female in no acute distress Respiratory: Diminished to auscultation bilaterally, no wheezing, rales, rhonchi or crackles. Normal respiratory effort and patient is not tachypenic. No accessory muscle use.  Unlabored breathing Cardiovascular: RRR, no murmurs / rubs / gallops. S1 and S2 auscultated. No extremity edema.  Abdomen: Soft, non-tender, distended secondary to body habitus bowel sounds positive.  GU: Deferred. Musculoskeletal: No clubbing / cyanosis of digits/nails. No joint deformity upper and lower extremities.  Skin: No rashes, lesions, ulcers on a limited skin evaluation and PICC line has been removed.  Mild erythema where her site was Neurologic: CN 2-12 grossly intact with no focal deficits.  Romberg sign and cerebellar reflexes not assessed.  Psychiatric: Normal judgment and insight. Alert and oriented x 3. Normal mood and appropriate affect.   Data Reviewed: I have  personally reviewed following labs and imaging studies  CBC: Recent Labs  Lab 08/03/24 1420 08/04/24 0422  WBC 8.6 8.0  NEUTROABS 7.2  --   HGB 12.4 11.9*  HCT 38.6 36.4  MCV 93.0 92.6  PLT 371 347   Basic Metabolic Panel: Recent Labs  Lab 08/03/24 1420 08/04/24 0422  NA 140 141  K 4.2 3.6  CL 101 103  CO2 28 25  GLUCOSE 103* 84  BUN 18 15  CREATININE 0.56 0.61  CALCIUM 10.2 8.8*  MG  --  2.3  PHOS  --  3.2   GFR: Estimated Creatinine Clearance: 76.3 mL/min (by C-G formula based on SCr of 0.61 mg/dL). Liver Function Tests: Recent Labs  Lab 08/03/24 1420 08/04/24 0422  AST 118* 88*  ALT 186* 131*  ALKPHOS 557* 367*  BILITOT 0.4 0.6  PROT 7.5 5.9*  ALBUMIN 4.6 2.9*   No results for input(s): LIPASE, AMYLASE in the last 168 hours. No results for  input(s): AMMONIA in the last 168 hours. Coagulation Profile: No results for input(s): INR, PROTIME in the last 168 hours. Cardiac Enzymes: No results for input(s): CKTOTAL, CKMB, CKMBINDEX, TROPONINI in the last 168 hours. BNP (last 3 results) No results for input(s): PROBNP in the last 8760 hours. HbA1C: No results for input(s): HGBA1C in the last 72 hours. CBG: No results for input(s): GLUCAP in the last 168 hours. Lipid Profile: No results for input(s): CHOL, HDL, LDLCALC, TRIG, CHOLHDL, LDLDIRECT in the last 72 hours. Thyroid  Function Tests: No results for input(s): TSH, T4TOTAL, FREET4, T3FREE, THYROIDAB in the last 72 hours. Anemia Panel: No results for input(s): VITAMINB12, FOLATE, FERRITIN, TIBC, IRON, RETICCTPCT in the last 72 hours. Sepsis Labs: Recent Labs  Lab 08/03/24 1420  LATICACIDVEN 1.7   Recent Results (from the past 240 hours)  Aerobic/Anaerobic Culture w Gram Stain (surgical/deep wound)     Status: None   Collection Time: 07/26/24 12:37 PM   Specimen: Abscess  Result Value Ref Range Status   Specimen Description ABSCESS  Final    Special Requests LIVER  Final   Gram Stain   Final    ABUNDANT WBC PRESENT, PREDOMINANTLY PMN NO ORGANISMS SEEN    Culture   Final    No growth aerobically or anaerobically. Performed at [facility], [address], [place], [place] [address]    Report Status 07/31/2024 FINAL  Final  Culture, blood (routine x 2)     Status: None (Preliminary result)   Collection Time: 08/03/24  2:09 PM   Specimen: BLOOD  Result Value Ref Range Status   Specimen Description   Final    BLOOD LEFT ANTECUBITAL Performed at [facility], [address] [place], [place] [address]    Special Requests   Final    BOTTLES DRAWN AEROBIC AND ANAEROBIC Blood Culture adequate volume Performed at [facility], [address] [place], [place] [address]    Culture   Final    NO GROWTH < 12 HOURS Performed at [facility], [address] [place], [place] [address]    Report Status PENDING  Incomplete  Culture, blood (routine x 2)     Status: None (Preliminary result)   Collection Time: 08/03/24  6:09 PM   Specimen: BLOOD  Result Value Ref Range Status   Specimen Description BLOOD SITE NOT SPECIFIED  Final   Special Requests   Final    BOTTLES DRAWN AEROBIC AND ANAEROBIC Blood Culture results may not be optimal due to an inadequate volume of blood received in culture bottles   Culture   Final    NO GROWTH < 12 HOURS Performed at [facility], [address] [place], [place] [address]    Report Status PENDING  Incomplete    Radiology Studies: No results found.  Scheduled Meds:  acidophilus  1 capsule Oral BID   amoxicillin   1,000 mg Oral TID   famotidine   40 mg Oral BID   nystatin   5 mL Oral QID   predniSONE   10 mg Oral Q breakfast   sodium chloride  flush  3 mL Intravenous Q12H   Continuous Infusions:  meropenem  (MERREM ) IV 1 g (08/04/24 1511)    LOS: 1 day   Alejandro Marker, DO [facility] Available via Epic secure chat  7am-7pm After these hours, please refer to coverage provider listed on [email or web address] 08/04/2024, 8:37 PM

## 2024-08-04 NOTE — Hospital Course (Addendum)
 Laurie Terry is a 58 y.o. female with a history of giant cell arteritis, PMR, Klebsiella bacteremia, hepatic abscesses.  Patient presented secondary to erythema of her PICC site concerning for an infected line.  Patient had her PICC removed and was started empirically on antibiotics.  Blood cultures were obtained and ID was consulted as well.  Workup is negative for new infection to date.

## 2024-08-05 ENCOUNTER — Encounter: Payer: Self-pay | Admitting: Internal Medicine

## 2024-08-05 DIAGNOSIS — R7881 Bacteremia: Secondary | ICD-10-CM | POA: Diagnosis not present

## 2024-08-05 NOTE — Plan of Care (Signed)
   Problem: Clinical Measurements: Goal: Will remain free from infection Outcome: Progressing   Problem: Safety: Goal: Ability to remain free from injury will improve Outcome: Progressing   Problem: Skin Integrity: Goal: Risk for impaired skin integrity will decrease Outcome: Progressing

## 2024-08-05 NOTE — Progress Notes (Signed)
 PROGRESS NOTE    Laurie Terry  FMW:[id number] DOB: 1966/09/15 DOA: 08/03/2024 PCP: [name], MD   Brief Narrative: Laurie Terry is a 58 y.o. female with a history of giant cell arteritis, PMR, Klebsiella bacteremia, hepatic abscesses.  Patient presented secondary to erythema of her PICC site concerning for an infected line.  Patient had her PICC removed and was started empirically on antibiotics.  Blood cultures were obtained and ID was consulted as well.  Workup is negative for new infection to date.   Assessment and Plan:  Cellulitis Possible PICC infection Erythema around PICC line with concern for possible PICC line infection. ID consulted. PICC removed and patient covered with antibiotics, for which she was already on for treatment of prior infection.  Blood cultures obtained and are no growth to date. -ID recommendations: Continue meropenem  IV and amoxicillin  p.o.  History of Klebsiella bacteremia History of liver abscesses Diagnosed on prior admission.  Patient managed on IV antibiotic regimen as an outpatient for which she had a PICC line in place. - Continue meropenem  IV and amoxicillin  p.o. - ID recommendations: Plan for repeat PICC placement prior to discharge this admission  History of temporal arteritis - Continue prednisone  milligrams daily  History of polymyalgia rheumatica - Continue prednisone  10 mg daily  Elevated AST/ALT Likely related to underlying infection with liver abscesses.  AST and ALT trended down.  Normocytic anemia Mild.  GERD - Continue famotidine   Overweight Estimated body mass index is 27.12 kg/m as calculated from the following:   Height as of this encounter: 5' 5 (1.651 m).   Weight as of this encounter: 73.9 kg.   DVT prophylaxis: SCDs Code Status:   Code Status: Full Code Family Communication: None at bedside Disposition Plan: Discharge home likely in 24 hours pending ID recommendations and after PICC line  placed   Consultants:  Infectious disease  Procedures:  None  Antimicrobials: Meropenem  Amoxicillin    Subjective: Patient reports no issues this morning.  Her arm feels much better today.  She is noted that there is no redness of the site and no tenderness today.  Objective: BP 99/61 (BP Location: Left Arm)   Pulse 82   Temp 98.3 F (36.8 C)   Resp 16   Ht 5' 5 (1.651 m)   Wt 73.9 kg   SpO2 99%   BMI 27.12 kg/m   Examination:  General exam: Appears calm and comfortable. Respiratory system: Clear to auscultation. Respiratory effort normal. Cardiovascular system: S1 & S2 heard, RRR. No murmur. Gastrointestinal system: Abdomen is nondistended, soft and nontender. Normal bowel sounds heard. Central nervous system: Alert and oriented. No focal neurological deficits. Musculoskeletal: No edema. No calf tenderness Skin: No cyanosis. No rashes. Right arm prior PICC site appears without erythema Psychiatry: Judgement and insight appear normal. Mood & affect appropriate.    Data Reviewed: I have personally reviewed following labs and imaging studies  CBC Lab Results  Component Value Date   WBC 8.0 08/04/2024   RBC 3.93 08/04/2024   HGB 11.9 (L) 08/04/2024   HCT 36.4 08/04/2024   MCV 92.6 08/04/2024   MCH 30.3 08/04/2024   PLT 347 08/04/2024   MCHC 32.7 08/04/2024   RDW 14.6 08/04/2024   LYMPHSABS 0.8 08/03/2024   MONOABS 0.4 08/03/2024   EOSABS 0.1 08/03/2024   BASOSABS 0.1 08/03/2024     Last metabolic panel Lab Results  Component Value Date   NA 141 08/04/2024   K 3.6 08/04/2024   CL 103  08/04/2024   CO2 25 08/04/2024   BUN 15 08/04/2024   CREATININE 0.61 08/04/2024   GLUCOSE 84 08/04/2024   GFRNONAA >60 08/04/2024   GFRAA >60 04/17/2016   CALCIUM 8.8 (L) 08/04/2024   PHOS 3.2 08/04/2024   PROT 5.9 (L) 08/04/2024   ALBUMIN 2.9 (L) 08/04/2024   BILITOT 0.6 08/04/2024   ALKPHOS 367 (H) 08/04/2024   AST 88 (H) 08/04/2024   ALT 131 (H) 08/04/2024    ANIONGAP 13 08/04/2024    GFR: Estimated Creatinine Clearance: 76.3 mL/min (by C-G formula based on SCr of 0.61 mg/dL).  Recent Results (from the past 240 hours)  Culture, blood (routine x 2)     Status: None (Preliminary result)   Collection Time: 08/03/24  2:09 PM   Specimen: BLOOD  Result Value Ref Range Status   Specimen Description   Final    BLOOD LEFT ANTECUBITAL Performed at [facility], [address] [place], [place] [address]    Special Requests   Final    BOTTLES DRAWN AEROBIC AND ANAEROBIC Blood Culture adequate volume Performed at [facility], [address] [place], [place] [address]    Culture   Final    NO GROWTH 2 DAYS Performed at [facility], [address] [place], [place] [address]    Report Status PENDING  Incomplete  Culture, blood (routine x 2)     Status: None (Preliminary result)   Collection Time: 08/03/24  6:09 PM   Specimen: BLOOD  Result Value Ref Range Status   Specimen Description BLOOD SITE NOT SPECIFIED  Final   Special Requests   Final    BOTTLES DRAWN AEROBIC AND ANAEROBIC Blood Culture results may not be optimal due to an inadequate volume of blood received in culture bottles   Culture   Final    NO GROWTH 2 DAYS Performed at [facility], [address] [place], [place] [address]    Report Status PENDING  Incomplete  Cath Tip Culture     Status: None (Preliminary result)   Collection Time: 08/03/24  8:46 PM   Specimen: Catheter Tip; Other  Result Value Ref Range Status   Specimen Description CATH TIP PICC LINE  Final   Special Requests Normal  Final   Culture   Final    NO GROWTH 1 DAY Performed at [facility], [address] [place], [place] [address]    Report Status PENDING  Incomplete      Radiology Studies: No results found.    LOS: 2 days    Elgin Lam, MD [facility] 08/05/2024, 5:06 PM   If 7PM-7AM, please contact  night-coverage [email or web address]

## 2024-08-05 NOTE — TOC CM/SW Note (Signed)
 Patient from with [facility] for IV ABX. Pam with [facility] and Amy with [facility] notified of patient's admission

## 2024-08-06 ENCOUNTER — Other Ambulatory Visit: Payer: Self-pay

## 2024-08-06 DIAGNOSIS — K750 Abscess of liver: Secondary | ICD-10-CM | POA: Diagnosis not present

## 2024-08-06 DIAGNOSIS — B370 Candidal stomatitis: Secondary | ICD-10-CM

## 2024-08-06 DIAGNOSIS — R7881 Bacteremia: Secondary | ICD-10-CM | POA: Diagnosis not present

## 2024-08-06 MED ORDER — SODIUM CHLORIDE 0.9% FLUSH
10.0000 mL | Freq: Two times a day (BID) | INTRAVENOUS | Status: DC
  Administered 2024-08-06: 10 mL

## 2024-08-06 MED ORDER — MEROPENEM IV (FOR PTA / DISCHARGE USE ONLY): 1.0000 g | Freq: Three times a day (TID) | INTRAVENOUS | 0 refills | Status: AC

## 2024-08-06 MED ORDER — CHLORHEXIDINE GLUCONATE CLOTH 2 % EX PADS: 6.0000 | MEDICATED_PAD | Freq: Every day | CUTANEOUS | Status: DC

## 2024-08-06 MED ORDER — NYSTATIN 100000 UNIT/ML MT SUSP: 5.0000 mL | Freq: Four times a day (QID) | OROMUCOSAL | Status: AC

## 2024-08-06 MED ORDER — HEPARIN SOD (PORK) LOCK FLUSH 100 UNIT/ML IV SOLN
250.0000 [IU] | INTRAVENOUS | Status: AC | PRN
  Administered 2024-08-06: 250 [IU]

## 2024-08-06 MED ORDER — SODIUM CHLORIDE 0.9% FLUSH: 10.0000 mL | INTRAVENOUS | Status: DC | PRN

## 2024-08-06 NOTE — Plan of Care (Signed)
  Problem: Clinical Measurements: Goal: Will remain free from infection Outcome: Progressing   Problem: Safety: Goal: Ability to remain free from injury will improve Outcome: Progressing   

## 2024-08-06 NOTE — Progress Notes (Signed)
 [facility] Date of Admission:  08/03/2024                             Reason for Consult: Possible PICC Line Infection. On Prolonged Antibiotics for Hepatic Abscesses                      Referring Provider: Blease Quiver, MD   Assessment and Plan: Possible PICC Line Infection Hepatic Abscesses  Thrush - Nystatin  swish and swallow    - PICC line has been removed. Blood cultures and PICC line tip cultures are NGTD - A new PICC line can be placed  - Recommend continuing IV Meropenem  and PO Amoxicillin  - Patient is to continue Nystatin  swish and swallow for her thrush  - Repeat MRI and MRCP scheduled for August 13, 2024 with ID follow up afterwards   Patient Active Problem List   Diagnosis Date Noted   Bacteremia 08/03/2024   PICC line infection 08/03/2024   Acute bacterial endocarditis 06/09/2024   Liver abscess 06/08/2024   Temporal giant cell arteritis (HCC) 06/08/2024   Severe sepsis (HCC) 06/06/2024   PMR (polymyalgia rheumatica) 06/06/2024   Pubic bone pain 05/27/2024   Sinus bradycardia 05/26/2024   Bacteremia due to Pseudomonas 05/24/2024   Normocytic anemia 05/24/2024   Dilatation of aorta 01/24/2023   Perinephric abscess 03/03/2022   Norovirus 02/15/2022   Dry cough 02/02/2022   Iatrogenic subdiaphragmatic abscess (HCC) 02/02/2022   Pleural effusion on right    Post-ERCP acute pancreatitis 01/16/2022   GERD without esophagitis 01/16/2022   Choledocholithiasis 01/16/2022   Adverse effect of other drugs, medicaments and biological substances, subsequent encounter 05/23/2021   Other allergic rhinitis 05/23/2021   Allergic conjunctivitis of both eyes 05/23/2021   History of asthma 05/23/2021   Oral allergy syndrome, subsequent encounter 05/23/2021   Family history of bicuspid aortic valve 11/28/2020   Other hyperlipidemia 11/28/2020   PAC (premature atrial contraction) 10/30/2020   Postoperative bile leak 04/12/2016   Acute  hypokalemia 04/12/2016   Paroxysmal SVT (supraventricular tachycardia) 04/12/2016   Bile leak, postoperative 04/12/2016   S/P laparoscopic cholecystectomy    History of Gallstone pancreatitis 04/07/2016   Pancreatitis due to biliary obstruction (HCC) 04/07/2016   Cancer (HCC)    Osteopenia    Current Discharge Medication List     CONTINUE these medications which have NOT CHANGED   Details  acetaminophen  (TYLENOL ) 500 MG tablet Take 1,000 mg by mouth daily as needed for mild pain (pain score 1-3) or moderate pain (pain score 4-6).    amoxicillin  (AMOXIL ) 500 MG capsule Take 2 capsules (1,000 mg total) by mouth 3 (three) times daily. Qty: 180 capsule, Refills: 2    atovaquone  (MEPRON ) 750 MG/5ML suspension Take 750 mg by mouth in the morning, at noon, and at bedtime.    COLESTID  1 g tablet Take 1-2 g by mouth as needed (diarrhea).    diclofenac  Sodium (VOLTAREN ) 1 % GEL Apply 2 g topically 4 (four) times daily.    dicyclomine  (BENTYL ) 20 MG tablet Take 1 tablet (20 mg total) by mouth 2 (two) times daily. Qty: 20 tablet, Refills: 0    EPINEPHrine  0.3 mg/0.3 mL IJ SOAJ injection Inject 0.3 mg into the muscle as needed for anaphylaxis. Qty: 2 each, Refills: 1    !! famotidine  (PEPCID ) 20 MG tablet Take 20 mg by mouth daily as needed for heartburn or indigestion.  fluticasone  (FLONASE ) 50 MCG/ACT nasal spray Place 2 sprays into both nostrils daily. Qty: 16 g, Refills: 5    nystatin  (MYCOSTATIN ) 100000 UNIT/ML suspension Take 5 mLs by mouth daily as needed (thrush).    predniSONE  (DELTASONE ) 5 MG tablet Take 10 mg by mouth daily with breakfast.    saccharomyces boulardii (FLORASTOR) 250 MG capsule Take 1 capsule (250 mg total) by mouth 2 (two) times daily. Qty: 60 capsule, Refills: 0    !! famotidine  (PEPCID ) 40 MG tablet Take 40 mg by mouth 2 (two) times daily.    methocarbamol (ROBAXIN) 500 MG tablet Take 500 mg by mouth 3 (three) times daily.    tiZANidine (ZANAFLEX) 4 MG  tablet 1 tablet as needed for muscle relaxation Orally 3 times a day; Duration: 10 days    traMADol  (ULTRAM ) 50 MG tablet 1 tablet as needed for severe pain Orally every 8 hrs; Duration: 5 days     !! - Potential duplicate medications found. Please discuss with provider.     Subjective: The patient was sitting in her bedside chair in no apparent distress. She denies any complaints at this time. Soreness in her right arm here her PICC line was removed has removed. No erythema. Afebrile. No acute events overnight   Review of Systems: Review of Systems  Constitutional:  Negative for chills and fever.  HENT:  Negative for sore throat.   Eyes:  Negative for blurred vision.  Respiratory:  Negative for cough and shortness of breath.   Cardiovascular:  Negative for chest pain.  Gastrointestinal:  Negative for abdominal pain, constipation, diarrhea, nausea and vomiting.  Genitourinary:  Negative for dysuria.  Musculoskeletal:  Negative for joint pain.  Skin:  Negative for itching and rash.  Neurological:  Negative for focal weakness, weakness and headaches.   Past Medical History:  Diagnosis Date   Allergic rhinitis    Aortic aneurysm 09/23/2020   Cancer (HCC)    Melanoma   Cervical dysplasia 2007   low-grade SIL, normal Pap smears since then   Family history of adverse reaction to anesthesia    mom has h/o post op nausea/vomiting   Intermittent palpitations    IUD    inserted 03/2002.04/06/2007, 06/03/2012   Ocular migraine    Osteomalacia    Pancreatitis    Seafood allergy    Social History   Tobacco Use   Smoking status: Former    Passive exposure: Past   Smokeless tobacco: Never   Tobacco comments:    Patient said a long time ago. 07/19/2024  Vaping Use   Vaping status: Never Used  Substance Use Topics   Alcohol use: Not Currently   Drug use: No   Family History  Problem Relation Age of Onset   Tremor Mother    Stroke Father    Autoimmune disease Father    CVA  Father    Breast cancer Sister 55       3X   Arthritis Sister        Psoriatic   Thyroid  disease Sister    Breast cancer Maternal Grandmother        Age 72   Thyroid  disease Daughter        Hypo   Uterine cancer Neg Hx    Bladder Cancer Neg Hx    Renal cancer Neg Hx    Allergies  Allergen Reactions   Cefepime  Cough    Patient develops cough, rash in her ears. 06/07/24 developed extreme itching with dose, tongue swelling  Contrast Media [Iodinated Contrast Media] Shortness Of Breath and Other (See Comments)    Low blood pressure    Midodrine  Shortness Of Breath   Avocado Itching and Other (See Comments)    Tongue and mouth.    Fish Allergy Hives and Swelling   Levofloxacin Hives and Other (See Comments)    Pt already has aortic aneurysm.   Pantoprazole  Other (See Comments)    Joint aches, fever, peeling of skin   Quinolones Other (See Comments)    Contraindicated due to history of Aortic Aneurysm    Shellfish Allergy Hives and Swelling   Sulfa Antibiotics Hives   Vancomycin  Rash, Other (See Comments) and Cough    Patient develops cough, rash ears, tingling lips after receiving Cefepime  and vancomycin  01/23/2022 - Patient had an allergy test and the MD stated that it is not a real allergy and that it was just an infusion reaction    Augmentin  [Amoxicillin -Pot Clavulanate] Nausea And Vomiting   Objective: Vitals:   08/05/24 0812 08/05/24 1931 08/06/24 0446 08/06/24 0834  BP: 99/61 106/66 (!) 104/58 105/77  Pulse: 82 85 67 93  Resp: 16 17 18 16   Temp: 98.3 F (36.8 C) 98 F (36.7 C) (!) 97.5 F (36.4 C) 97.8 F (36.6 C)  TempSrc:    Oral  SpO2: 99% 97% 97% 98%  Weight:      Height:       Body mass index is 27.12 kg/m. General: Well developed, well nourished female in no apparent distress HENT: Moist mucous membranes, normal nose, normal external ears, and normocephalic Neck: Supple, trachea midline, and normal cervical range of motion Eyes: PERRL, EOMI,  non-icteric, and normal conjunctivae and lids Lungs: Clear to auscultation bilaterally. No wheezing, rales or rhonchi Cardiac: Regular rate and rhythm. No murmurs, rubs or gallops. No peripheral edema Abdomen: Soft, Non distended, Non tender, active bowel sounds Skin: Intact, no focal erythema or rash, and warm and dry. Right upper arm PICC line insertion site is without erythema, signs of infection or drainage  GU: Defered genital exam Musculoskeletal: No obvious skeletal abnormalities Neuro: Alert, no focal neurologic deficits, moves all extremities Psych: Oriented x 3, cooperative   Problem List Items Addressed This Visit   None Visit Diagnoses       PICC line infection, initial encounter    -  Primary   Relevant Medications   meropenem  (MERREM ) 1 g in sodium chloride  0.9 % 100 mL IVPB   nystatin  (MYCOSTATIN ) 100000 UNIT/ML suspension   nystatin  (MYCOSTATIN ) 100000 UNIT/ML suspension 500,000 Units      Evalene CHRISTELLA Munch, MD [facility] 08/06/2024, 10:24 AM

## 2024-08-06 NOTE — Progress Notes (Signed)
 Patient discharged to home, AVS reviewed. IV team placed PICC< flushed and capped for home. No new medications, patient to schedule follow up with PCP.

## 2024-08-06 NOTE — Discharge Summary (Signed)
 Physician Discharge Summary   Patient: Laurie Terry MRN: [id number] DOB: September 25, 1965  Admit date:     08/03/2024  Discharge date: 08/06/24  Discharge Physician: Elgin Lam, MD   PCP: [name], MD   Recommendations at discharge:  PCP visit for hospital follow-up ID visit for hospital follow-up  Discharge Diagnoses: Principal Problem:   Bacteremia Active Problems:   GERD without esophagitis   PMR (polymyalgia rheumatica)   Liver abscess   Temporal giant cell arteritis (HCC)   PICC line infection  Resolved Problems:   * No resolved hospital problems. *  Hospital Course: [name] is a 58 y.o. female with a history of giant cell arteritis, PMR, Klebsiella bacteremia, hepatic abscesses.  Patient presented secondary to erythema of her PICC site concerning for an infected line.  Patient had her PICC removed and was started empirically on antibiotics.  Blood cultures were obtained and ID was consulted as well.  Workup is negative for new infection to date.  Assessment and Plan:  Cellulitis Possible PICC infection Erythema around PICC line with concern for possible PICC line infection. ID consulted. PICC removed and patient covered with antibiotics, for which she was already on for treatment of prior infection.  Blood cultures obtained and are no growth to date.    History of Klebsiella bacteremia History of liver abscesses Diagnosed on prior admission.  Patient managed on IV antibiotic regimen as an outpatient for which she had a PICC line in place. PICC was removed on admission for concern of a PICC infection. Meropenem  and amoxicillin  continued while inpatient. PICC replaced prior to discharge. Patient to follow-up with ID as an outpatient with plan for repeat MRI/MRCP as an outpatient.   History of temporal arteritis Continue prednisone  milligrams daily.   History of polymyalgia rheumatica Continue prednisone  10 mg daily.   Elevated AST/ALT Likely  related to underlying infection with liver abscesses.  AST and ALT trended down.   Normocytic anemia Mild.  Thrush Recurrent. Patient started on nystatin  with improvement of symptoms. Continue on discharge.   GERD Continue famotidine .   Overweight Estimated body mass index is 27.12 kg/m as calculated from the following:   Height as of this encounter: 5' 5 (1.651 m).   Weight as of this encounter: 73.9 kg.   Consultants:  Infectious disease   Procedures:  None  Disposition: Home health Diet recommendation: Regular diet   DISCHARGE MEDICATION: Allergies as of 08/06/2024       Reactions   Cefepime  Cough   Patient develops cough, rash in her ears. 06/07/24 developed extreme itching with dose, tongue swelling   Contrast Media [iodinated Contrast Media] Shortness Of Breath, Other (See Comments)   Low blood pressure    Midodrine  Shortness Of Breath   Avocado Itching, Other (See Comments)   Tongue and mouth.    Fish Allergy Hives, Swelling   Levofloxacin Hives, Other (See Comments)   Pt already has aortic aneurysm.   Pantoprazole  Other (See Comments)   Joint aches, fever, peeling of skin   Quinolones Other (See Comments)   Contraindicated due to history of Aortic Aneurysm    Shellfish Allergy Hives, Swelling   Sulfa Antibiotics Hives   Vancomycin  Rash, Other (See Comments), Cough   Patient develops cough, rash ears, tingling lips after receiving Cefepime  and vancomycin  01/23/2022 - Patient had an allergy test and the MD stated that it is not a real allergy and that it was just an infusion reaction    Augmentin  [amoxicillin -pot Clavulanate] Nausea  And Vomiting        Medication List     STOP taking these medications    methocarbamol 500 MG tablet Commonly known as: ROBAXIN   tiZANidine 4 MG tablet Commonly known as: ZANAFLEX   traMADol  50 MG tablet Commonly known as: ULTRAM        TAKE these medications    acetaminophen  500 MG tablet Commonly known as:  TYLENOL  Take 1,000 mg by mouth daily as needed for mild pain (pain score 1-3) or moderate pain (pain score 4-6).   amoxicillin  500 MG capsule Commonly known as: AMOXIL  Take 2 capsules (1,000 mg total) by mouth 3 (three) times daily.   atovaquone  750 MG/5ML suspension Commonly known as: MEPRON  Take 750 mg by mouth in the morning, at noon, and at bedtime.   Colestid  1 g tablet Generic drug: colestipol  Take 1-2 g by mouth as needed (diarrhea).   diclofenac  Sodium 1 % Gel Commonly known as: VOLTAREN  Apply 2 g topically 4 (four) times daily. What changed:  how much to take when to take this reasons to take this   dicyclomine  20 MG tablet Commonly known as: BENTYL  Take 1 tablet (20 mg total) by mouth 2 (two) times daily. What changed:  when to take this reasons to take this   EPINEPHrine  0.3 mg/0.3 mL Soaj injection Commonly known as: EPI-PEN Inject 0.3 mg into the muscle as needed for anaphylaxis.   famotidine  20 MG tablet Commonly known as: PEPCID  Take 20 mg by mouth daily as needed for heartburn or indigestion. What changed: Another medication with the same name was removed. Continue taking this medication, and follow the directions you see here.   fluticasone  50 MCG/ACT nasal spray Commonly known as: FLONASE  Place 2 sprays into both nostrils daily. What changed:  when to take this reasons to take this   meropenem  IVPB Commonly known as: MERREM  Inject 1 g into the vein every 8 (eight) hours for 19 days. Indication:  liver abscess First Dose: Yes Last Day of Therapy:  08/25/2024 Labs - Once weekly:  CBC/D and BMP, Labs - Once weekly: ESR and CRP Method of administration: Mini-Bag Plus / Gravity Method of administration may be changed at the discretion of home infusion pharmacist based upon assessment of the patient and/or caregiver's ability to self-administer the medication ordered.   nystatin  100000 UNIT/ML suspension Commonly known as: MYCOSTATIN  Take 5 mLs  (500,000 Units total) by mouth 4 (four) times daily for 8 days. What changed:  when to take this reasons to take this   predniSONE  5 MG tablet Commonly known as: DELTASONE  Take 10 mg by mouth daily with breakfast.   saccharomyces boulardii 250 MG capsule Commonly known as: FLORASTOR Take 1 capsule (250 mg total) by mouth 2 (two) times daily.               Discharge Care Instructions  (From admission, onward)           Start     Ordered   08/06/24 0000  Change dressing on IV access line weekly and PRN  (Home infusion instructions - [facility] )        08/06/24 1330            Contact information for follow-up providers     [name], MD Follow up.   Specialty: Family Medicine Contact information: [address] [place] [address] [phone]         Dennise Kingsley, MD Follow up on 08/24/2024.  Specialty: Infectious Diseases Why: 1:45 PM Contact information: [address], [address] [place] [address] [phone]              Contact information for after-discharge care     Home Medical Care     [facility]) .   Service: Home Health Services Contact information: [address] [place]  [phone]                    Discharge Exam: BP 108/69 (BP Location: Left Arm)   Pulse 83   Temp 98.7 F (37.1 C) (Oral)   Resp 18   Ht 5' 5 (1.651 m)   Wt 73.9 kg   SpO2 97%   BMI 27.12 kg/m   General exam: Appears calm and comfortable. Respiratory system: Clear to auscultation. Respiratory effort normal. Cardiovascular system: S1 & S2 heard, RRR. No murmur. Gastrointestinal system: Abdomen is nondistended, soft and nontender. Normal bowel sounds heard. Central nervous system: Alert and oriented. No focal neurological deficits. Musculoskeletal: No edema. No calf tenderness Skin: No cyanosis. No rashes Psychiatry: Judgement and insight appear normal.  Mood & affect appropriate.   Condition at discharge: stable  The results of significant diagnostics from this hospitalization (including imaging, microbiology, ancillary and laboratory) are listed below for reference.   Imaging Studies: US  EKG SITE RITE Result Date: 08/06/2024 If Site Rite image not attached, placement could not be confirmed due to current cardiac rhythm.  US  IMAGE GUIDED DRAINAGE BY PERCUTANEOUS CATHETER Result Date: 07/26/2024 INDICATION: Multiple liver lesions presumed P[place] abscesses. Recent CT demonstrated enlargement of some of the lesions, and decrease in size of others. EXAM: ULTRASOUND-GUIDED ASPIRATION OF HEPATIC ABSCESS MEDICATIONS: Lidocaine  1% subcutaneous ANESTHESIA/SEDATION: Intravenous Fentanyl  25mcg and Versed  0.5mg  were administered by RN during a total moderate (conscious) sedation time of 12 minutes; the patient's level of consciousness and physiological / cardiorespiratory status were monitored continuously by radiology RN under my direct supervision. COMPLICATIONS: None immediate. PROCEDURE: Informed written consent was obtained from the patient after a thorough discussion of the procedural risks, benefits and alternatives. All questions were addressed. Maximal Sterile Barrier Technique was utilized including caps, mask, sterile gowns, sterile gloves, sterile drape, hand hygiene and skin antiseptic. A timeout was performed prior to the initiation of the procedure. A representative right lobe lesion was localized. After the region was prepped and draped in the usual sterile fashion and moderate sedation initiated, 18 gauge trocar needle advanced into the collection. Approximately 1.5 mL purulent material were aspirated, sent for Gram stain and culture. Follow-up ultrasound demonstrated near complete resolution of the lesion with a only small punctate hyperechoic residual. No other significant lesions were identified. The patient tolerated the procedure well.  IMPRESSION: Technically successful aspiration and decompression of right hepatic lesion returning 1.5 mL purulent material, sent for Gram stain and culture. Electronically Signed   By: JONETTA Faes M.D.   On: 07/26/2024 16:49   CT CHEST ABDOMEN PELVIS WO CONTRAST Result Date: 07/17/2024 CLINICAL DATA:  History of hepatic abscess, pulmonary emboli * Tracking Code: BO * EXAM: CT CHEST, ABDOMEN AND PELVIS WITHOUT CONTRAST TECHNIQUE: Multidetector CT imaging of the chest, abdomen and pelvis was performed following the standard protocol without IV contrast. RADIATION DOSE REDUCTION: This exam was performed according to the departmental dose-optimization program which includes automated exposure control, adjustment of the mA and/or kV according to patient size and/or use of iterative reconstruction technique. COMPARISON:  06/06/2024 FINDINGS: CT CHEST FINDINGS  Cardiovascular: Right upper extremity PICC. Normal heart size. No pericardial effusion. Mediastinum/Nodes: No enlarged mediastinal, hilar, or axillary lymph nodes. Thyroid  gland, trachea, and esophagus demonstrate no significant findings. Lungs/Pleura: Bandlike scarring of the bilateral lung bases. Previously seen bilateral cavitary pulmonary nodules are almost completely resolved, small residual in the medial anterior left upper lobe measuring 0.6 cm (series 6, image 73). No pleural effusion or pneumothorax. Musculoskeletal: No chest wall abnormality. No acute osseous findings. CT ABDOMEN PELVIS FINDINGS Hepatobiliary: Multiple hypodense liver lesions, some of which are enlarged compared to prior examination, for example in the central right lobe of the liver measuring 2.3 x 1.9 cm (series 2, image 69) and in the posterior right lobe of the liver measuring 2.6 x 2.0 cm (series 2, image 62). Others are diminished in size, for example in the anterior right lobe of the liver measuring 0.9 x 0.8 cm (series 2, image 63). Cholecystectomy. Postoperative pneumobilia.  Pancreas: Unremarkable. No pancreatic ductal dilatation or surrounding inflammatory changes. Spleen: Normal in size without significant abnormality. Adrenals/Urinary Tract: Adrenal glands are unremarkable. Punctuate nonobstructive right renal calculus. No left-sided calculi, ureteral calculi, or hydronephrosis. Bladder is unremarkable. Stomach/Bowel: Stomach is within normal limits. Appendix appears normal. No evidence of bowel wall thickening, distention, or inflammatory changes. Vascular/Lymphatic: No significant vascular findings are present. No enlarged abdominal or pelvic lymph nodes. Reproductive: No mass or other abnormality. Other: No abdominal wall hernia or abnormality. No ascites. Musculoskeletal: No acute osseous findings. IMPRESSION: 1. Multiple hypodense liver lesions, some of which are enlarged compared to prior examination, others are diminished in size. Enlarged lesions are concerning for persistent/progressing hepatic abscesses. Consider contrast enhanced MRI to further evaluate. 2. Previously seen bilateral cavitary pulmonary nodules are almost completely resolved. 3. Nonobstructive right nephrolithiasis. Electronically Signed   By: Marolyn JONETTA Jaksch M.D.   On: 07/17/2024 21:50    Microbiology: Results for orders placed or performed during the hospital encounter of 08/03/24  Culture, blood (routine x 2)     Status: None (Preliminary result)   Collection Time: 08/03/24  2:09 PM   Specimen: BLOOD  Result Value Ref Range Status   Specimen Description   Final    BLOOD LEFT ANTECUBITAL Performed at [facility], [address], [place], [place] [address]    Special Requests   Final    BOTTLES DRAWN AEROBIC AND ANAEROBIC Blood Culture adequate volume Performed at [facility], [address] [place], [place] [address]    Culture   Final    NO GROWTH 3 DAYS Performed at [facility], [address], [place], [place] [address]    Report  Status PENDING  Incomplete  Culture, blood (routine x 2)     Status: None (Preliminary result)   Collection Time: 08/03/24  6:09 PM   Specimen: BLOOD  Result Value Ref Range Status   Specimen Description BLOOD SITE NOT SPECIFIED  Final   Special Requests   Final    BOTTLES DRAWN AEROBIC AND ANAEROBIC Blood Culture results may not be optimal due to an inadequate volume of blood received in culture bottles   Culture   Final    NO GROWTH 3 DAYS Performed at [facility], [address] [place], [place] [address]    Report Status PENDING  Incomplete  Cath Tip Culture     Status: None (Preliminary result)   Collection Time: 08/03/24  8:46 PM   Specimen: Catheter Tip; Other  Result Value Ref Range Status   Specimen  Description CATH TIP PICC LINE  Final   Special Requests Normal  Final   Culture   Final    NO GROWTH 3 DAYS Performed at [facility], [address] [place], [place] [address]    Report Status PENDING  Incomplete    Labs: CBC: Recent Labs  Lab 08/03/24 1420 08/04/24 0422  WBC 8.6 8.0  NEUTROABS 7.2  --   HGB 12.4 11.9*  HCT 38.6 36.4  MCV 93.0 92.6  PLT 371 347   Basic Metabolic Panel: Recent Labs  Lab 08/03/24 1420 08/04/24 0422  NA 140 141  K 4.2 3.6  CL 101 103  CO2 28 25  GLUCOSE 103* 84  BUN 18 15  CREATININE 0.56 0.61  CALCIUM 10.2 8.8*  MG  --  2.3  PHOS  --  3.2   Liver Function Tests: Recent Labs  Lab 08/03/24 1420 08/04/24 0422  AST 118* 88*  ALT 186* 131*  ALKPHOS 557* 367*  BILITOT 0.4 0.6  PROT 7.5 5.9*  ALBUMIN 4.6 2.9*    Discharge time spent: 35 minutes.  Signed: Elgin Lam, MD [facility] 08/06/2024

## 2024-08-06 NOTE — Progress Notes (Signed)
 Peripherally Inserted Central Catheter Placement  The IV Nurse has discussed with the patient and/or persons authorized to consent for the patient, the purpose of this procedure and the potential benefits and risks involved with this procedure.  The benefits include less needle sticks, lab draws from the catheter, and the patient may be discharged home with the catheter. Risks include, but not limited to, infection, bleeding, blood clot (thrombus formation), and puncture of an artery; nerve damage and irregular heartbeat and possibility to perform a PICC exchange if needed/ordered by physician.  Alternatives to this procedure were also discussed.  Bard Power PICC patient education guide, fact sheet on infection prevention and patient information card has been provided to patient /or left at bedside.    PICC Placement Documentation  PICC Single Lumen 08/06/24 Right Basilic 37 cm 0 cm (Active)  Indication for Insertion or Continuance of Line Prolonged intravenous therapies 08/06/24 1305  Exposed Catheter (cm) 0 cm 08/06/24 1305  Site Assessment Clean, Dry, Intact 08/06/24 1305  Line Status Flushed;Saline locked;Blood return noted 08/06/24 1305  Dressing Type Transparent;Securing device 08/06/24 1305  Dressing Status Antimicrobial disc/dressing in place 08/06/24 1305  Line Care Connections checked and tightened 08/06/24 1305  Line Adjustment (NICU/IV Team Only) No 08/06/24 1305  Dressing Intervention New dressing;Adhesive placed at insertion site (IV team only) 08/06/24 1305  Dressing Change Due 08/13/24 08/06/24 1305       Leita  Javionna Leder 08/06/2024, 1:07 PM

## 2024-08-06 NOTE — TOC Transition Note (Signed)
 Transition of Care [facility]) - Discharge Note   Patient Details  Name: Laurie Terry MRN: [id number] Date of Birth: August 03, 1966  Transition of [facility]) CM/SW Contact:  Rosalva Jon Bloch, RN Phone Number: 08/06/2024, 1:51 PM   Clinical Narrative:    Patient will DC un:ynfz Anticipated DC date: 08/06/2024 Family notified: yes Transport by: car       Cellulitis, ? PICC infection Per MD patient ready for DC today. Pt  agreeable to resume home health services. RN, patient, [facility] and [facility] notified of DC.  Pt is from home with spouse. Family to assist with needed care once home. Post hospital f/u noted on AVS. Pt without RX med concerns or transportation issues.  RNCM will sign off for now as intervention is no longer needed. Please consult us  again if new needs arise.    Final next level of care: Home w Home Health Services Barriers to Discharge: No Barriers Identified   Patient Goals and CMS Choice            Discharge Placement                       Discharge Plan and Services Additional resources added to the After Visit Summary for                                       Social Drivers of Health (SDOH) Interventions SDOH Screenings   Food Insecurity: No Food Insecurity (08/03/2024)  Housing: Low Risk  (08/03/2024)  Transportation Needs: No Transportation Needs (08/03/2024)  Utilities: Not At Risk (08/03/2024)  Depression (PHQ2-9): Low Risk  (10/13/2023)  Social Connections: Moderately Integrated (05/22/2024)  Tobacco Use: Medium Risk (08/03/2024)     Readmission Risk Interventions    06/07/2024    2:25 PM  Readmission Risk Prevention Plan  Transportation Screening Complete  PCP or Specialist Appt within 3-5 Days Complete  HRI or Home Care Consult Complete  Social Work Consult for Recovery Care Planning/Counseling Complete  Palliative Care Screening Not Applicable  Medication Review Ocean[employer])  Complete

## 2024-08-07 LAB — CATH TIP CULTURE
Culture: NO GROWTH
Special Requests: NORMAL

## 2024-08-08 LAB — CULTURE, BLOOD (ROUTINE X 2)
Culture: NO GROWTH
Culture: NO GROWTH
Special Requests: ADEQUATE

## 2024-08-10 ENCOUNTER — Telehealth: Payer: Self-pay

## 2024-08-10 NOTE — Telephone Encounter (Signed)
 Received call from Nike Southwell Rn with [facility] requesting clarification on antbx end date. Order shows end date for IV antbx 10/31- updated opat shows end date 12/3. Relayed correct end date to RN. Will reach out to [facility] for orders. Lorenda CHRISTELLA Code, RMA

## 2024-08-13 ENCOUNTER — Other Ambulatory Visit: Payer: Self-pay | Admitting: Internal Medicine

## 2024-08-13 ENCOUNTER — Ambulatory Visit (HOSPITAL_COMMUNITY)
Admission: RE | Admit: 2024-08-13 | Discharge: 2024-08-13 | Disposition: A | Discharge status: Home / Self Care | Source: Ambulatory Visit | Attending: Internal Medicine | Admitting: Internal Medicine

## 2024-08-13 DIAGNOSIS — K750 Abscess of liver: Secondary | ICD-10-CM | POA: Diagnosis present

## 2024-08-13 MED ORDER — GADOBUTROL 1 MMOL/ML IV SOLN
7.0000 mL | Freq: Once | INTRAVENOUS | Status: AC | PRN
  Administered 2024-08-13: 7 mL via INTRAVENOUS

## 2024-08-15 ENCOUNTER — Encounter: Payer: Self-pay | Admitting: Internal Medicine

## 2024-08-16 NOTE — Telephone Encounter (Signed)
 Nothing faxed yet!

## 2024-08-17 NOTE — Telephone Encounter (Signed)
 AST/ALT elevated but stable. ALP slihgly elevated from past. Will await mri results. As previously noted: follow up imaging at appt, it can take a week to results.

## 2024-08-22 ENCOUNTER — Ambulatory Visit: Payer: Self-pay | Admitting: Internal Medicine

## 2024-08-24 ENCOUNTER — Telehealth: Payer: Self-pay

## 2024-08-24 ENCOUNTER — Encounter: Payer: Self-pay | Admitting: Internal Medicine

## 2024-08-24 ENCOUNTER — Ambulatory Visit: Admitting: Internal Medicine

## 2024-08-24 ENCOUNTER — Other Ambulatory Visit: Payer: Self-pay

## 2024-08-24 ENCOUNTER — Other Ambulatory Visit: Payer: Self-pay | Admitting: Gastroenterology

## 2024-08-24 VITALS — BP 111/77 | HR 89 | Temp 97.7°F | Wt 170.0 lb

## 2024-08-24 DIAGNOSIS — R748 Abnormal levels of other serum enzymes: Secondary | ICD-10-CM

## 2024-08-24 DIAGNOSIS — B965 Pseudomonas (aeruginosa) (mallei) (pseudomallei) as the cause of diseases classified elsewhere: Secondary | ICD-10-CM

## 2024-08-24 DIAGNOSIS — R300 Dysuria: Secondary | ICD-10-CM

## 2024-08-24 DIAGNOSIS — K750 Abscess of liver: Secondary | ICD-10-CM

## 2024-08-24 DIAGNOSIS — B370 Candidal stomatitis: Secondary | ICD-10-CM

## 2024-08-24 DIAGNOSIS — R7881 Bacteremia: Secondary | ICD-10-CM | POA: Diagnosis not present

## 2024-08-24 NOTE — Progress Notes (Addendum)
 Patient: Laurie Terry  DOB: 1965/11/01 MRN: [id number] PCP: [name], MD    Patient Active Problem List   Diagnosis Date Noted   Bacteremia 08/03/2024   PICC line infection 08/03/2024   Acute bacterial endocarditis 06/09/2024   Liver abscess 06/08/2024   Temporal giant cell arteritis (HCC) 06/08/2024   Severe sepsis (HCC) 06/06/2024   PMR (polymyalgia rheumatica) 06/06/2024   Pubic bone pain 05/27/2024   Sinus bradycardia 05/26/2024   Bacteremia due to Pseudomonas 05/24/2024   Normocytic anemia 05/24/2024   Dilatation of aorta 01/24/2023   Perinephric abscess 03/03/2022   Norovirus 02/15/2022   Dry cough 02/02/2022   Iatrogenic subdiaphragmatic abscess (HCC) 02/02/2022   Pleural effusion on right    Post-ERCP acute pancreatitis 01/16/2022   GERD without esophagitis 01/16/2022   Choledocholithiasis 01/16/2022   Adverse effect of other drugs, medicaments and biological substances, subsequent encounter 05/23/2021   Other allergic rhinitis 05/23/2021   Allergic conjunctivitis of both eyes 05/23/2021   History of asthma 05/23/2021   Oral allergy syndrome, subsequent encounter 05/23/2021   Family history of bicuspid aortic valve 11/28/2020   Other hyperlipidemia 11/28/2020   PAC (premature atrial contraction) 10/30/2020   Postoperative bile leak 04/12/2016   Acute hypokalemia 04/12/2016   Paroxysmal SVT (supraventricular tachycardia) 04/12/2016   Bile leak, postoperative 04/12/2016   S/P laparoscopic cholecystectomy    History of Gallstone pancreatitis 04/07/2016   Pancreatitis due to biliary obstruction (HCC) 04/07/2016   Cancer (HCC)    Osteopenia      Subjective:  CHELISE HANGER is a 58 year old female with past medical history of giant cell arteritis with PMR on prednisone  started on prophylactic atovaquone , history of intra-abdominal abscesses subhepatic/perinephric cultures growing E faecalis Candida albicans following ERCP in 2023 she was  treated with Augmentin  and fluconazole , recent admission 8/27 - 9/9 with Klebsiella pneumonia bacteremia suspected secondary to GI source found to have hepatic abscess status post aspiration growing E faecium, otitis pubis discharged on amoxicillin  and cefadroxil  presents for hospital follow-up of Pseudomonas bacteremia.  She was has been 9/14 - 9/22 after presented from GI clinic with light headedness and low blood pressure.  Blood cultures grew Pseudomonas.  CT showed concern for multifocal cavitary pneumonia/septic emboli as well as hepatic abscesses.  TEE was without vegetation.  Small PFO with left-to-right shunt noted.  Hepatic abscesses appear too small in size for IR to drain at that time.  She was discharged on meropenem  and ampicillin  x 6 weeks from 9/17 EOT 10/29  L:V: notes she had some issues with abx. ,Loose stool is on and off since discharge from hospital.she has had negative c diff testing in the interim(GI office). No abd pain. She has a non productive cougt as such ct chest, abdomen, pelvis ordered.  Today: Notes the redness still [persists at picc site (about the same asa at discharge),  Some ruq pain.  Review of Systems  All other systems reviewed and are negative.   Past Medical History:  Diagnosis Date   Allergic rhinitis    Aortic aneurysm 09/23/2020   Cancer (HCC)    Melanoma   Cervical dysplasia 2007   low-grade SIL, normal Pap smears since then   Family history of adverse reaction to anesthesia    mom has h/o post op nausea/vomiting   Intermittent palpitations    IUD    inserted 03/2002.04/06/2007, 06/03/2012   Ocular migraine    Osteomalacia    Pancreatitis    Seafood allergy  Outpatient Medications Prior to Visit  Medication Sig Dispense Refill   acetaminophen  (TYLENOL ) 500 MG tablet Take 1,000 mg by mouth daily as needed for mild pain (pain score 1-3) or moderate pain (pain score 4-6).     amoxicillin  (AMOXIL ) 500 MG capsule Take 2 capsules (1,000 mg total)  by mouth 3 (three) times daily. 180 capsule 2   atovaquone  (MEPRON ) 750 MG/5ML suspension Take 750 mg by mouth in the morning, at noon, and at bedtime.     COLESTID  1 g tablet Take 1-2 g by mouth as needed (diarrhea).     diclofenac  Sodium (VOLTAREN ) 1 % GEL Apply 2 g topically 4 (four) times daily. (Patient taking differently: Apply 1 Application topically daily as needed (pain).)     dicyclomine  (BENTYL ) 20 MG tablet Take 1 tablet (20 mg total) by mouth 2 (two) times daily. (Patient taking differently: Take 20 mg by mouth daily as needed for spasms.) 20 tablet 0   EPINEPHrine  0.3 mg/0.3 mL IJ SOAJ injection Inject 0.3 mg into the muscle as needed for anaphylaxis. 2 each 1   famotidine  (PEPCID ) 20 MG tablet Take 20 mg by mouth daily as needed for heartburn or indigestion.     fluticasone  (FLONASE ) 50 MCG/ACT nasal spray Place 2 sprays into both nostrils daily. (Patient taking differently: Place 2 sprays into both nostrils daily as needed for allergies.) 16 g 5   meropenem  (MERREM ) IVPB Inject 1 g into the vein every 8 (eight) hours for 19 days. Indication:  liver abscess First Dose: Yes Last Day of Therapy:  08/25/2024 Labs - Once weekly:  CBC/D and BMP, Labs - Once weekly: ESR and CRP Method of administration: Mini-Bag Plus / Gravity Method of administration may be changed at the discretion of home infusion pharmacist based upon assessment of the patient and/or caregiver's ability to self-administer the medication ordered. 56 Units 0   predniSONE  (DELTASONE ) 5 MG tablet Take 10 mg by mouth daily with breakfast.     saccharomyces boulardii (FLORASTOR) 250 MG capsule Take 1 capsule (250 mg total) by mouth 2 (two) times daily. 60 capsule 0   No facility-administered medications prior to visit.     Allergies  Allergen Reactions   Cefepime  Cough    Patient develops cough, rash in her ears. 06/07/24 developed extreme itching with dose, tongue swelling   Contrast Media [Iodinated Contrast Media]  Shortness Of Breath and Other (See Comments)    Low blood pressure    Midodrine  Shortness Of Breath   Avocado Itching and Other (See Comments)    Tongue and mouth.    Fish Allergy Hives and Swelling   Levofloxacin Hives and Other (See Comments)    Pt already has aortic aneurysm.   Pantoprazole  Other (See Comments)    Joint aches, fever, peeling of skin   Quinolones Other (See Comments)    Contraindicated due to history of Aortic Aneurysm    Shellfish Allergy Hives and Swelling   Sulfa Antibiotics Hives   Vancomycin  Rash, Other (See Comments) and Cough    Patient develops cough, rash ears, tingling lips after receiving Cefepime  and vancomycin  01/23/2022 - Patient had an allergy test and the MD stated that it is not a real allergy and that it was just an infusion reaction    Augmentin  [Amoxicillin -Pot Clavulanate] Nausea And Vomiting    Social History   Tobacco Use   Smoking status: Former    Passive exposure: Past   Smokeless tobacco: Never   Tobacco comments:  Patient said a long time ago. 07/19/2024  Vaping Use   Vaping status: Never Used  Substance Use Topics   Alcohol use: Not Currently   Drug use: No    Family History  Problem Relation Age of Onset   Tremor Mother    Stroke Father    Autoimmune disease Father    CVA Father    Breast cancer Sister 70       3X   Arthritis Sister        Psoriatic   Thyroid  disease Sister    Breast cancer Maternal Grandmother        Age 51   Thyroid  disease Daughter        Hypo   Uterine cancer Neg Hx    Bladder Cancer Neg Hx    Renal cancer Neg Hx     Objective:  There were no vitals filed for this visit. There is no height or weight on file to calculate BMI.  Physical Exam Constitutional:      Appearance: Normal appearance.  HENT:     Head: Normocephalic and atraumatic.     Right Ear: Tympanic membrane normal.     Left Ear: Tympanic membrane normal.     Nose: Nose normal.     Mouth/Throat:     Mouth: Mucous  membranes are moist.  Eyes:     Extraocular Movements: Extraocular movements intact.     Conjunctiva/sclera: Conjunctivae normal.     Pupils: Pupils are equal, round, and reactive to light.  Cardiovascular:     Rate and Rhythm: Normal rate and regular rhythm.     Heart sounds: No murmur heard.    No friction rub. No gallop.  Pulmonary:     Effort: Pulmonary effort is normal.     Breath sounds: Normal breath sounds.  Musculoskeletal:        General: Normal range of motion.  Skin:    General: Skin is warm and dry.  Neurological:     General: No focal deficit present.     Mental Status: She is alert and oriented to person, place, and time.  Psychiatric:        Mood and Affect: Mood normal.     Lab Results: Lab Results  Component Value Date   WBC 8.0 08/04/2024   HGB 11.9 (L) 08/04/2024   HCT 36.4 08/04/2024   MCV 92.6 08/04/2024   PLT 347 08/04/2024    Lab Results  Component Value Date   CREATININE 0.61 08/04/2024   BUN 15 08/04/2024   NA 141 08/04/2024   K 3.6 08/04/2024   CL 103 08/04/2024   CO2 25 08/04/2024    Lab Results  Component Value Date   ALT 131 (H) 08/04/2024   AST 88 (H) 08/04/2024   ALKPHOS 367 (H) 08/04/2024   BILITOT 0.6 08/04/2024     Assessment & Plan:  #Pseudomonas bacteremia secondary to intra-abdominal source #History of Klebsiella bacteremia and E faecium hepatic abscesses secondary to intra-abdominal source - Patient admitted initially admitted 8/27 - 9/9 for Klebsiella bacteremia suspect secondary GI source.  She was found to have hepatic abscesses, underwent IR aspiration with cultures growing E faecium and she was discharged Amoxident cefadroxil  - She returned from GI clinic due to low blood pressure on 9/14 found to have Pseudomonas bacteremia - CT chest abdomen pelvis showed several scattered lung nodules which were new compared to CT on 8/15.  Several scattered hypodense hepatic lesions observed, noted to have increased in size from  1.6 X1 0.4-1.9 X1 0.7 cm compared to previous MRI 9/3 previous pelvic ascites resolved - TEE without vegetation, small PFO - She was discharged on 9/22 on meropenem  ampicillin  x 6 weeks EOT 10/29. CT chest abdomen pelvis noted multiple hypodense liver lesions some of which are enlarged compared to prior examination.  Others are diminished in size.  Enlarged lesion concerning for persistent/progressive hepatic abscesses.  Consider MR enhanced contrast for further evaluation.  Previously seen bilateral cavitary pulmonary nodules are almost completely resolved. Following last visit patient underwent ultrasound-guided aspiration of right hepatic lesion, 1.5 mL fluid was aspirated that was purulent.  Cultures no growth on 11/3 - Patient also had concern for PICC line infection which was removed and replaced.  Cultures no growth - Underwent MRI with MRCP on 11/21 as planned.  MRI showed resolving liver lesions.  Noted interval resolution of decreased size of many of the previously noted rim-enhancing fluid collections.  New scattered mild peripheral intrahepatic bile duct dilatation with a solid peribiliary fat Hanssen could be infectious versus inflammatory.  I spoke with Dr. Rosalie today.  In regards to neck steps.  Recommending ERCP.  Noted that we will continue antibiotics if ERCP is the near future.  Noted that patient has been on antibiotics x 10 weeks. Plan: - Continue antibiotics till nv on 12/11. She states she is amenable to ERCP - Patient has appointment with Dr. Rosalie tomorrow will follow-up on plan.  #Medication management #PICC line #Elevated LFTs 11/24 WBC 10, SCr 0.66, ESR 18, CRP 3, AST 82, ALT 124, alk phos 492 AST and ALT trending down, ALP stable but elevated which would point towards a biliary etiology. This was discussed during visit  #PICC line -states picc line was mnaipulated. She states its sore. She notes its just as red as during discharge. Counseled to continue to monitor if  erythema worsens to go to ed.   #thrush -not used it for 4-5 days. She thinks she sees thrush under tongue, did not appreciate during exam.   #mild dysuria -ua with cx  OPAT Orders Discharge antibiotics to be given via PICC line Discharge antibiotics: ampicillin  and meropenem  per pharmacy dosing  Per pharmacy protocol   End Date: 12/11   [facility] Care Per Protocol:   Home health RN for IV administration and teaching; PICC line care and labs.     Labs weekly while on IV antibiotics: X__ CBC with differential __ BMP X__ CMP X__ CRP X__ ESR __ Vancomycin  trough __ CK   __ Please pull PIC at completion of IV antibiotics X__ Please leave PIC in place until doctor has seen patient or been notified   Fax weekly labs to [phone]   Clinic Follow Up Appt: 12/11 Loney Stank, MD [facility]   08/24/24  1:06 PM I personally spent a total of 104 minutes in the care of the patient today including preparing to see the patient, getting/reviewing separately obtained history, performing a medically appropriate exam/evaluation, counseling and educating, placing orders, referring and communicating with other health care professionals, documenting clinical information in the EHR, independently interpreting results, and communicating results.

## 2024-08-24 NOTE — Telephone Encounter (Signed)
 Per MD extend OPAT through 12/11- community message sent to [employer]. Lorenda CHRISTELLA Code, RMA

## 2024-08-25 ENCOUNTER — Ambulatory Visit: Payer: Self-pay | Admitting: Internal Medicine

## 2024-08-25 LAB — URINALYSIS, ROUTINE W REFLEX MICROSCOPIC
Bilirubin Urine: NEGATIVE
Hgb urine dipstick: NEGATIVE
Ketones, ur: NEGATIVE
Leukocytes,Ua: NEGATIVE
Nitrite: NEGATIVE
Protein, ur: NEGATIVE
Specific Gravity, Urine: 1.009 (ref 1.001–1.035)
pH: 7 (ref 5.0–8.0)

## 2024-08-26 ENCOUNTER — Encounter (HOSPITAL_COMMUNITY): Payer: Self-pay | Admitting: Gastroenterology

## 2024-08-26 ENCOUNTER — Ambulatory Visit

## 2024-08-26 ENCOUNTER — Ambulatory Visit: Admitting: Primary Care

## 2024-08-26 DIAGNOSIS — R053 Chronic cough: Secondary | ICD-10-CM | POA: Diagnosis not present

## 2024-08-26 DIAGNOSIS — Z7952 Long term (current) use of systemic steroids: Secondary | ICD-10-CM

## 2024-08-26 LAB — PULMONARY FUNCTION TEST
DL/VA % pred: 89 %
DL/VA: 3.76 ml/min/mmHg/L
DLCO cor % pred: 86 %
DLCO cor: 17.5 ml/min/mmHg
DLCO unc % pred: 81 %
DLCO unc: 16.64 ml/min/mmHg
FEF 25-75 Post: 3.17 L/s
FEF 25-75 Pre: 2.56 L/s
FEF2575-%Change-Post: 23 %
FEF2575-%Pred-Post: 132 %
FEF2575-%Pred-Pre: 106 %
FEV1-%Change-Post: 6 %
FEV1-%Pred-Post: 104 %
FEV1-%Pred-Pre: 97 %
FEV1-Post: 2.69 L
FEV1-Pre: 2.53 L
FEV1FVC-%Change-Post: 4 %
FEV1FVC-%Pred-Pre: 102 %
FEV6-%Change-Post: 1 %
FEV6-%Pred-Post: 99 %
FEV6-%Pred-Pre: 98 %
FEV6-Post: 3.22 L
FEV6-Pre: 3.16 L
FEV6FVC-%Pred-Post: 103 %
FEV6FVC-%Pred-Pre: 103 %
FVC-%Change-Post: 1 %
FVC-%Pred-Post: 96 %
FVC-%Pred-Pre: 94 %
FVC-Post: 3.22 L
FVC-Pre: 3.16 L
Post FEV1/FVC ratio: 84 %
Post FEV6/FVC ratio: 100 %
Pre FEV1/FVC ratio: 80 %
Pre FEV6/FVC Ratio: 100 %
RV % pred: 85 %
RV: 1.68 L
TLC % pred: 98 %
TLC: 4.97 L

## 2024-08-26 NOTE — Telephone Encounter (Signed)
 We can extend till 12/15, picc pull per protocol. Would not recommend further abx at this point,, as noted liver lesions improved. For any further concerns please follow up clinic.

## 2024-08-26 NOTE — Progress Notes (Signed)
 Full PFT performed today.

## 2024-08-26 NOTE — Patient Instructions (Signed)
 Full PFT performed today.

## 2024-08-26 NOTE — Telephone Encounter (Signed)
 Community message sent to [facility] with [employer] regarding updated end date.

## 2024-08-26 NOTE — Telephone Encounter (Signed)
 Copied from CRM [id number]. Topic: Appointments - Appointment Cancel/Reschedule >> Aug 26, 2024 10:59 AM Ismael A wrote: Patient/patient representative is calling to reschedule an appointment. Patient received a message in mychart to reschedule her OV for 12/05 to a virtual appt. Due to weather - I was unable to reschedule on my end. She is requesting a call back to confirm change and instruction on how to attend virtual appt.   Called and spoke to pt - advised on how to join virtual visit. Sending message to front staff to reschedule her visit as virtual. NFN.

## 2024-08-27 ENCOUNTER — Telehealth: Payer: Self-pay | Admitting: Primary Care

## 2024-08-27 ENCOUNTER — Telehealth: Admitting: Primary Care

## 2024-08-27 ENCOUNTER — Ambulatory Visit: Admitting: Primary Care

## 2024-08-27 DIAGNOSIS — Z8709 Personal history of other diseases of the respiratory system: Secondary | ICD-10-CM | POA: Diagnosis not present

## 2024-08-27 DIAGNOSIS — M316 Other giant cell arteritis: Secondary | ICD-10-CM | POA: Diagnosis not present

## 2024-08-27 DIAGNOSIS — R053 Chronic cough: Secondary | ICD-10-CM | POA: Diagnosis not present

## 2024-08-27 NOTE — Patient Instructions (Signed)
  VISIT SUMMARY: Today, you came in for a follow-up appointment after a serious lung infection caused by pseudomonas bacteria. You have a persistent, slight cough that has been ongoing since September. Your recent pulmonary function tests show normal lung function, and your last CT scan did not show any residual pneumonia.  YOUR PLAN: -CHRONIC COUGH FOLLOWING CAVITARY LUNG LESION DUE TO PRIOR PSEUDOMONAS PNEUMONIA: Your chronic cough is a result of a previous lung infection caused by pseudomonas bacteria. Your recent tests show normal lung function, and there is no evidence of remaining pneumonia. You should continue your current medications, including prednisone , amoxicillin , IV meropenem , and a probiotic. There is no need for a daily inhaler as you do not have COPD or asthma. Improvement is being observed week to week.  INSTRUCTIONS: Please follow up with Dr. Pleas in early to mid-February for further evaluation.

## 2024-08-27 NOTE — Progress Notes (Signed)
 Virtual Visit via Video Note  I connected with Laurie Terry on 08/27/24 at  8:30 AM EST by a video enabled telemedicine application and verified that I am speaking with the correct person using two identifiers.  Location: Patient: Home Provider: Office    I discussed the limitations of evaluation and management by telemedicine and the availability of in person appointments. The patient expressed understanding and agreed to proceed.  History of Present Illness: Laurie Terry is a 58 y.o. female with PMH significant for bilateral pleural effusion, lap cholecystectomy, pancreatitis due to biliary obstruction, paroxysmal SVT, thoracic aortic aneurysm without rupture,  PMR on chronic systemic steroids. In April/2023 she underwent ERCP which was complicated by micro perforation and fluid collection in the gallbladder fossa. She was also noted to have reactive pleural effusion. And required thoracentesis for that Used to follow up with Dr Geronimo  She was admitted to the hospital in 05/2024 for pseudomonas bacteremia. CT showed concern for multifocal cavitary pneumonia/septic emboli as well as hepatic abscesses. Small PFO with left-to-right shunt noted. Hepatic abscesses appear too small in size for IR to drain at that time. She was discharged on meropenem  and ampicillin  x 6 weeks from 9/17  She recently underwent CT abd pelvis/chest in 06/2024. It was concerning for  persistent/progressing hepatic abscesses. Lung cavities have resolved completely. She follows with ID. IR guided drainage has been ordered. She also complained of cough- hence was referred to this clinic.  Pt reports childhood hx of asthma. But this doesn't feel like allergies or asthma to her She denies sinus congestion and post nasal drip She does have hx of acid pepcid  reflux and has recently cut down on her pepcid  use. 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 No ILD on CT chest. Rare scarring on areas on prior cavities Pt reports childhood hx of asthma. But this doesn't feel like allergies or asthma to her She denies sinus congestion and post nasal drip She does have hx of acid pepcid  reflux and has recently cut down on her pepcid  use. She will start taking it regularly Patient was instructed not to lay on bed earlier than 2 hours after last meal, was advised to avoid late night meals/snacks and to avoid coffee and caffeine as much as possible. Advised her to take pepcid  BID regularly Orders:   Pulmonary function test; Future   Bacteremia due to Pseudomonas As per ID   Cavitary lesion of lung Resolved Reviewed prior and current CT chest with the patient No evidence of residual cavities in lung. small residual in the medial anterior left upper lobe  This will be followed up in future scans she will get for hepatic scans  08/27/2024- FU PFT results  Discussed the use of AI scribe software for clinical note transcription with the patient, who gave verbal consent to proceed.  History of Present Illness Laurie Terry is a 58 year old female who presents for follow-up after a serious lung infection. She was referred by Dr. Pleas for a pulmonary function test follow-up.  She has a persistent, slight cough that is not congested but feels irritated. This cough has persisted since her lung infection in September, which was  caused by pseudomonas bacteria. She describes her chest as feeling 'weird' and is uncertain if she experiences shortness of breath, noting that it 'doesn't feel quite normal'.  Her last CT scan was on October 23rd. Recent pulmonary  function tests showed a lung capacity of 104%.  She is currently taking prednisone , with a dosage of nine milligrams daily, consisting of one five-milligram tablet and four one-milligram tablets, as prescribed by her rheumatologist. Additionally, she is on amoxicillin , IV meropenem , and a probiotic.  Observations/Objective:  Appears well without overt respiratory symptoms  Assessment and Plan:  Assessment and Plan Assessment & Plan Chronic cough following cavitary lung lesion due to prior Pseudomonas pneumonia Chronic cough persists following a cavitary lung lesion due to Pseudomonas pneumonia in September. No evidence of residual pneumonia on the last CT scan from October 23rd. Pulmonary function tests show normal lung function with no obstructive or restrictive lung disease. Spirometry and diffusion capacity are normal. Total lung capacity is 98%. The cough is described as a slight, persistent, irritated cough without congestion. No interstitial lung disease noted. Improvement is observed week to week. No need for a daily inhaler as tests do not indicate COPD or asthma. - Continue Augmentin  and mepron  treatment per infectious disease   Temporal giant cell arteritis - Currently on 9mg  prednisone  taper - weaning as directed   Follow Up Instructions:  - Follow up with Dr. Pleas in early to mid-February.    I discussed the assessment and treatment plan with the patient. The patient was provided an opportunity to ask questions and all were answered. The patient agreed with the plan and demonstrated an understanding of the instructions.   The patient was advised to call back or seek an in-person evaluation if the symptoms worsen or if the condition fails to improve as anticipated.  I provided 22 minutes of non-face-to-face time during this encounter.   Almarie LELON Ferrari, NP

## 2024-08-27 NOTE — Telephone Encounter (Signed)
 Needs follow up with Dr. Pleas in [facility] February

## 2024-08-27 NOTE — Telephone Encounter (Signed)
 ATC 1x left VM sent LTR

## 2024-08-30 ENCOUNTER — Other Ambulatory Visit: Payer: Self-pay

## 2024-08-30 ENCOUNTER — Ambulatory Visit

## 2024-08-30 ENCOUNTER — Telehealth: Payer: Self-pay

## 2024-08-30 DIAGNOSIS — R7881 Bacteremia: Secondary | ICD-10-CM

## 2024-08-30 NOTE — Telephone Encounter (Signed)
 Thanks for the update

## 2024-08-30 NOTE — Telephone Encounter (Signed)
 Patient will come in this afternoon for labs and picc dressing change. She will bring a sterile dressing kit provided to her by [facility].   Keyontay Stolz, BSN, RN

## 2024-08-30 NOTE — Progress Notes (Signed)
 Patient came back to clinic as meropenem  was not infusing when she got home.   Flushed line with 10 mL NS, blood return noted. Exchanged neutral pressure cap and extension tubing. Connected meropenem  infusion. Infusing without difficulty at this time.   Deboraha Goar, BSN, RN

## 2024-08-30 NOTE — Telephone Encounter (Signed)
 Received call from [facility], Cecelia with [facility]. She is unable to get to the patient's home today for labs and PICC dressing change due to the weather. Advised her it would be okay if she went tomorrow.   She states patient may insist on having labs and dressing change today. Relayed to Cecelia that we could do labs in the clinic but that we do not have PICC dressing change kits today.   Dilia Alemany, BSN, RN

## 2024-08-30 NOTE — Progress Notes (Signed)
 Labs drawn via PICC line per Dr. Dennise. Line flushed with 10 mL normal saline and clamped. Patient tolerated procedure well.   PICC dressing changed. Mild redness around insertion site (see Media tab). Approximately 4 cm exposed catheter. Replaced extension tubing.   Lannie Heaps, BSN, RN

## 2024-08-31 ENCOUNTER — Ambulatory Visit (HOSPITAL_COMMUNITY)

## 2024-08-31 ENCOUNTER — Ambulatory Visit (HOSPITAL_COMMUNITY): Payer: Self-pay | Admitting: Anesthesiology

## 2024-08-31 ENCOUNTER — Other Ambulatory Visit: Payer: Self-pay

## 2024-08-31 ENCOUNTER — Encounter (HOSPITAL_COMMUNITY): Payer: Self-pay | Admitting: Gastroenterology

## 2024-08-31 DIAGNOSIS — I77819 Aortic ectasia, unspecified site: Secondary | ICD-10-CM | POA: Diagnosis not present

## 2024-08-31 DIAGNOSIS — D509 Iron deficiency anemia, unspecified: Secondary | ICD-10-CM | POA: Diagnosis not present

## 2024-08-31 DIAGNOSIS — K828 Other specified diseases of gallbladder: Secondary | ICD-10-CM | POA: Diagnosis not present

## 2024-08-31 DIAGNOSIS — Z09 Encounter for follow-up examination after completed treatment for conditions other than malignant neoplasm: Secondary | ICD-10-CM | POA: Diagnosis present

## 2024-08-31 DIAGNOSIS — R933 Abnormal findings on diagnostic imaging of other parts of digestive tract: Secondary | ICD-10-CM | POA: Diagnosis not present

## 2024-08-31 DIAGNOSIS — E7849 Other hyperlipidemia: Secondary | ICD-10-CM | POA: Diagnosis not present

## 2024-08-31 DIAGNOSIS — K219 Gastro-esophageal reflux disease without esophagitis: Secondary | ICD-10-CM | POA: Diagnosis not present

## 2024-08-31 DIAGNOSIS — Z8719 Personal history of other diseases of the digestive system: Secondary | ICD-10-CM | POA: Diagnosis not present

## 2024-08-31 DIAGNOSIS — Z7952 Long term (current) use of systemic steroids: Secondary | ICD-10-CM | POA: Diagnosis not present

## 2024-08-31 DIAGNOSIS — Z87891 Personal history of nicotine dependence: Secondary | ICD-10-CM | POA: Diagnosis not present

## 2024-08-31 HISTORY — PX: ERCP: SHX5425

## 2024-08-31 LAB — COMPLETE METABOLIC PANEL WITHOUT GFR
AG Ratio: 1.6 (calc) (ref 1.0–2.5)
ALT: 110 U/L — ABNORMAL HIGH (ref 6–29)
AST: 72 U/L — ABNORMAL HIGH (ref 10–35)
Albumin: 4.2 g/dL (ref 3.6–5.1)
Alkaline phosphatase (APISO): 456 U/L — ABNORMAL HIGH (ref 37–153)
BUN: 19 mg/dL (ref 7–25)
CO2: 27 mmol/L (ref 20–32)
Calcium: 9.6 mg/dL (ref 8.6–10.4)
Chloride: 102 mmol/L (ref 98–110)
Creat: 0.65 mg/dL (ref 0.50–1.03)
Globulin: 2.6 g/dL (ref 1.9–3.7)
Glucose, Bld: 122 mg/dL — ABNORMAL HIGH (ref 65–99)
Potassium: 4.3 mmol/L (ref 3.5–5.3)
Sodium: 139 mmol/L (ref 135–146)
Total Bilirubin: 0.3 mg/dL (ref 0.2–1.2)
Total Protein: 6.8 g/dL (ref 6.1–8.1)

## 2024-08-31 LAB — CBC WITH DIFFERENTIAL/PLATELET
Absolute Lymphocytes: 992 {cells}/uL (ref 850–3900)
Absolute Monocytes: 464 {cells}/uL (ref 200–950)
Basophils Absolute: 82 {cells}/uL (ref 0–200)
Basophils Relative: 0.9 %
Eosinophils Absolute: 82 {cells}/uL (ref 15–500)
Eosinophils Relative: 0.9 %
HCT: 38.6 % (ref 35.9–46.0)
Hemoglobin: 12.7 g/dL (ref 11.7–15.5)
MCH: 29.3 pg (ref 27.0–33.0)
MCHC: 32.9 g/dL (ref 31.6–35.4)
MCV: 89.1 fL (ref 81.4–101.7)
MPV: 10.5 fL (ref 7.5–12.5)
Monocytes Relative: 5.1 %
Neutro Abs: 7480 {cells}/uL (ref 1500–7800)
Neutrophils Relative %: 82.2 %
Platelets: 400 Thousand/uL (ref 140–400)
RBC: 4.33 Million/uL (ref 3.80–5.10)
RDW: 13.1 % (ref 11.0–15.0)
Total Lymphocyte: 10.9 %
WBC: 9.1 Thousand/uL (ref 3.8–10.8)

## 2024-08-31 LAB — C-REACTIVE PROTEIN: CRP: 12.6 mg/L — ABNORMAL HIGH (ref <8.0)

## 2024-08-31 LAB — SEDIMENTATION RATE: Sed Rate: 19 mm/h (ref 0–30)

## 2024-08-31 MED ORDER — MIDAZOLAM HCL 2 MG/2ML IJ SOLN
INTRAMUSCULAR | Status: AC
  Filled 2024-08-31: qty 2

## 2024-08-31 MED ORDER — DICLOFENAC SUPPOSITORY 100 MG
RECTAL | Status: AC
  Filled 2024-08-31: qty 1

## 2024-08-31 MED ORDER — SUGAMMADEX SODIUM 200 MG/2ML IV SOLN
INTRAVENOUS | Status: DC | PRN
  Administered 2024-08-31: 150 mg via INTRAVENOUS

## 2024-08-31 MED ORDER — MIDAZOLAM HCL 5 MG/5ML IJ SOLN
INTRAMUSCULAR | Status: DC | PRN
  Administered 2024-08-31: 2 mg via INTRAVENOUS

## 2024-08-31 MED ORDER — ROCURONIUM BROMIDE 10 MG/ML (PF) SYRINGE
PREFILLED_SYRINGE | INTRAVENOUS | Status: DC | PRN
  Administered 2024-08-31: 60 mg via INTRAVENOUS

## 2024-08-31 MED ORDER — PROPOFOL 10 MG/ML IV BOLUS
INTRAVENOUS | Status: DC | PRN
  Administered 2024-08-31: 50 mg via INTRAVENOUS
  Administered 2024-08-31: 30 mg via INTRAVENOUS
  Administered 2024-08-31: 20 mg via INTRAVENOUS
  Administered 2024-08-31: 100 mg via INTRAVENOUS

## 2024-08-31 MED ORDER — LIDOCAINE 2% (20 MG/ML) 5 ML SYRINGE
INTRAMUSCULAR | Status: DC | PRN
  Administered 2024-08-31: 100 mg via INTRAVENOUS

## 2024-08-31 MED ORDER — DICLOFENAC SUPPOSITORY 100 MG
RECTAL | Status: DC | PRN
  Administered 2024-08-31: 100 mg via RECTAL

## 2024-08-31 MED ORDER — DEXAMETHASONE SOD PHOSPHATE PF 10 MG/ML IJ SOLN
INTRAMUSCULAR | Status: DC | PRN
  Administered 2024-08-31: 10 mg via INTRAVENOUS

## 2024-08-31 MED ORDER — FENTANYL CITRATE (PF) 100 MCG/2ML IJ SOLN
INTRAMUSCULAR | Status: AC
  Filled 2024-08-31: qty 2

## 2024-08-31 MED ORDER — FENTANYL CITRATE (PF) 100 MCG/2ML IJ SOLN
INTRAMUSCULAR | Status: DC | PRN
  Administered 2024-08-31 (×2): 50 ug via INTRAVENOUS

## 2024-08-31 MED ORDER — GLUCAGON HCL RDNA (DIAGNOSTIC) 1 MG IJ SOLR
INTRAMUSCULAR | Status: AC
  Filled 2024-08-31: qty 1

## 2024-08-31 MED ORDER — PROPOFOL 10 MG/ML IV BOLUS
INTRAVENOUS | Status: AC
  Filled 2024-08-31: qty 20

## 2024-08-31 MED ORDER — SODIUM CHLORIDE 0.9 % IV SOLN: INTRAVENOUS | Status: DC

## 2024-08-31 MED ORDER — ONDANSETRON HCL 4 MG/2ML IJ SOLN
INTRAMUSCULAR | Status: DC | PRN
  Administered 2024-08-31: 4 mg via INTRAVENOUS

## 2024-08-31 NOTE — Progress Notes (Signed)
 Laurie Terry 12:09 PM  Subjective: Patient's pain was worse on Friday but a little better since then but it is worse with a deep breath and we rediscussed the procedure her antibiotics and her heparin  flushes and answered all of her questions Objective: Vital signs stable afebrile no acute distress in good spirits exam please see preassessment evaluation  Assessment: Abnormal MRI concerns over cholangitis  Plan: Okay to proceed with repeat ERCP with anesthesia assistance  [facility]  office [phone] After 5PM or if no answer call [phone]

## 2024-08-31 NOTE — Transfer of Care (Signed)
 Immediate Anesthesia Transfer of Care Note  Patient: Laurie Terry  Procedure(s) Performed: ERCP, WITH INTERVENTION IF INDICATED  Patient Location: PACU  Anesthesia Type:General  Level of Consciousness: awake, alert , and oriented  Airway & Oxygen Therapy: Patient Spontanous Breathing and Patient connected to face mask oxygen  Post-op Assessment: Report given to RN and Post -op Vital signs reviewed and stable  Post vital signs: Reviewed and stable  Last Vitals:  Vitals Value Taken Time  BP    Temp    Pulse 90 08/31/24 13:25  Resp 10 08/31/24 13:25  SpO2 100 % 08/31/24 13:25  Vitals shown include unfiled device data.  Last Pain:  Vitals:   08/31/24 1035  TempSrc: Temporal  PainSc: 3          Complications: No notable events documented.

## 2024-08-31 NOTE — Anesthesia Preprocedure Evaluation (Addendum)
 Anesthesia Evaluation  Patient identified by MRN, date of birth, ID band Patient awake    Reviewed: Allergy & Precautions, NPO status , Patient's Chart, lab work & pertinent test results  History of Anesthesia Complications Negative for: history of anesthetic complications  Airway Mallampati: II  TM Distance: <3 FB Neck ROM: Full    Dental  (+) Teeth Intact, Dental Advisory Given   Pulmonary former smoker Recent admission 05/2024 with Pseudomonas bacteremia/pneumonia and cavitary lesions   breath sounds clear to auscultation       Cardiovascular + Peripheral Vascular Disease (Mildly dilated aorta)  + dysrhythmias Supra Ventricular Tachycardia  Rhythm:Regular Rate:Normal  TEE (2025): 1. Left ventricular ejection fraction, by estimation, is 60 to 65%. The  left ventricle has normal function.   2. Right ventricular systolic function is normal. The right ventricular  size is normal.   3. No left atrial/left atrial appendage thrombus was detected.   4. The mitral valve is normal in structure. Mild mitral valve  regurgitation.   5. The aortic valve is normal in structure. Aortic valve regurgitation is  trivial.   6. Aortic dilatation noted. There is mild dilatation of the aortic root,  measuring 42 mm. There is borderline dilatation of the ascending aorta,  measuring 40 mm.   7. Evidence of atrial level shunting detected by color flow Doppler.  There is a small patent foramen ovale with predominantly left to right  shunting across the atrial septum.     Neuro/Psych  Headaches    GI/Hepatic ,GERD  Medicated and Controlled,,Hepatic Abscesses 2/2 Bacteremia (05/2024) for  Hx of Pancreatitis 2/2 Biliary Obstruction    Endo/Other  neg diabetes    Renal/GU Renal disease     Musculoskeletal   Abdominal   Peds  Hematology Hgb 12.7, Plts 400K (08/30/24)   Anesthesia Other Findings PMR on chronic steroids   Reproductive/Obstetrics                              Anesthesia Physical Anesthesia Plan  ASA: 3  Anesthesia Plan: General   Post-op Pain Management:    Induction: Intravenous  PONV Risk Score and Plan: 3 and Ondansetron , Dexamethasone  and Treatment may vary due to age or medical condition  Airway Management Planned: Oral ETT  Additional Equipment: None  Intra-op Plan:   Post-operative Plan: Extubation in OR  Informed Consent:      Dental advisory given  Plan Discussed with: CRNA  Anesthesia Plan Comments:         Anesthesia Quick Evaluation

## 2024-08-31 NOTE — Anesthesia Postprocedure Evaluation (Signed)
 Anesthesia Post Note  Patient: Laurie Terry  Procedure(s) Performed: ERCP, WITH INTERVENTION IF INDICATED     Patient location during evaluation: Endoscopy Anesthesia Type: General Level of consciousness: awake Pain management: pain level controlled Vital Signs Assessment: post-procedure vital signs reviewed and stable Respiratory status: spontaneous breathing Cardiovascular status: blood pressure returned to baseline Postop Assessment: no apparent nausea or vomiting Anesthetic complications: no   No notable events documented.  Last Vitals:  Vitals:   08/31/24 1340 08/31/24 1345  BP: 104/66   Pulse: 75 74  Resp: 14 14  Temp:    SpO2: 95% 96%    Last Pain:  Vitals:   08/31/24 1345  TempSrc:   PainSc: 2                  [name] T Colhoun

## 2024-08-31 NOTE — Op Note (Signed)
 [facility] Patient Name: Laurie Terry Procedure Date: 08/31/2024 MRN: [id number] Attending MD: Oliva Boots , MD, [id number] Date of Birth: April 18, 1966 CSN: [id number] Age: 58 Admit Type: Outpatient Procedure:                ERCP Indications:              Abnormal MRCP, Follow-up of ascending cholangitis Providers:                Oliva Boots, MD, Hoy Penner, RN, Farris Southgate,                            Technician Referring MD:              Medicines:                General Anesthesia Complications:            No immediate complications. Estimated Blood Loss:     Estimated blood loss: none. Procedure:                Pre-Anesthesia Assessment:                           - Prior to the procedure, a History and Physical                            was performed, and patient medications and                            allergies were reviewed. The patient's tolerance of                            previous anesthesia was also reviewed. The risks                            and benefits of the procedure and the sedation                            options and risks were discussed with the patient.                            All questions were answered, and informed consent                            was obtained. Prior Anticoagulants: The patient has                            taken heparin , last dose was 1 day prior to                            procedure. ASA Grade Assessment: II - A patient                            with mild systemic disease. After reviewing the  risks and benefits, the patient was deemed in                            satisfactory condition to undergo the procedure.                           After obtaining informed consent, the scope was                            passed under direct vision. Throughout the                            procedure, the patient's blood pressure, pulse, and                            oxygen  saturations were monitored continuously. The                            [id number]-Q190V ([id number]) Olympus duodenoscope was                            introduced through the mouth, and used to inject                            contrast into and used to cannulate the bile duct.                            The ERCP was accomplished without difficulty. The                            patient tolerated the procedure well. Scope In: Scope Out: Findings:      The major papilla was normal. A biliary sphincterotomy had been       performed. The sphincterotomy appeared widely open. Deep selective       cannulation with the sphincterotome and wire were readily obtained       unfortunately the wire did go towards the cystic duct clips a few times       and the sphincterotomy was repositioned and deep selective cannulation       of the CBD was obtained and to discover objects, the biliary tree was       swept with an adjustable 12- 15 mm balloon starting at the bifurcation,       left main hepatic duct and right main hepatic duct. Sludge was swept       from the duct on the initial balloon pull through. Subsequent balloon       pull-through's did not reveal any additional abnormalities and we       reposition the wire and swept both the left and the right main duct and       the main duct was swept using a 15 but had to be lowered to the 12 to       readily pass through the pain sphincterotomy site and both left and       right were swept with a 12 and there was adequate biliary drainage and       on  occlusion cholangiogram at the end of the procedure there was no       obvious filling defects and the balloon and wire were removed and the       scope was removed and the patient tolerated the procedure well there was       no obvious immediate complications and there was no pancreatic duct       injection or wire advancement throughout the procedure Impression:               - The major papilla appeared  normal.                           - Prior biliary sphincterotomy appeared widely open.                           - The biliary tree was swept and sludge was found                            on initial balloon pull-through. But none was seen                            on subsequent pull-through's and there was adequate                            biliary drainage Moderate Sedation:      Not Applicable - Patient had care per Anesthesia. Recommendation:           - Clear liquid diet for 6 hours. If doing well at 7                            PM may have soft solid                           - Continue present medications. Okay with me to                            stop antibiotics on Thursday when you see the                            infectious disease team                           - Return to GI clinic in 6 weeks.                           - Telephone GI clinic if symptomatic PRN. Procedure Code(s):        --- Professional ---                           [phone], Endoscopic retrograde                            cholangiopancreatography (ERCP); with removal of  calculi/debris from biliary/pancreatic duct(s) Diagnosis Code(s):        --- Professional ---                           K83.09, Other cholangitis                           R93.2, Abnormal findings on diagnostic imaging of                            liver and biliary tract CPT copyright 2022 [employer]. All rights reserved. The codes documented in this report are preliminary and upon coder review may  be revised to meet current compliance requirements. Oliva Boots, MD 08/31/2024 1:24:45 PM This report has been signed electronically. Number of Addenda: 0

## 2024-08-31 NOTE — Anesthesia Procedure Notes (Signed)
 Procedure Name: Intubation Date/Time: 08/31/2024 12:24 PM  Performed by: Leona Alen D, CRNAPre-anesthesia Checklist: Patient identified, Emergency Drugs available, Suction available and Patient being monitored Patient Re-evaluated:Patient Re-evaluated prior to induction Oxygen Delivery Method: Circle system utilized Preoxygenation: Pre-oxygenation with 100% oxygen Induction Type: IV induction Ventilation: Mask ventilation without difficulty Laryngoscope Size: Mac and 3 Grade View: Grade III Tube type: Oral Number of attempts: 2 Airway Equipment and Method: Stylet, Oral airway and Bougie stylet Placement Confirmation: ETT inserted through vocal cords under direct vision, positive ETCO2 and breath sounds checked- equal and bilateral Secured at: 21 cm Tube secured with: Tape Dental Injury: Teeth and Oropharynx as per pre-operative assessment

## 2024-08-31 NOTE — Discharge Instructions (Addendum)
 May have clear liquids until 7 PM and if doing well at 7 PM may have soft solids and call if GI question or problem otherwise okay with me to stop antibiotics on Thursday when you see the ID people and follow-up with me as needed or in 6 weeks and if diarrhea continues after stopping antibiotics next week please call for prescription or resume your ColestidYOU HAD AN ENDOSCOPIC PROCEDURE TODAY: Refer to the procedure report and other information in the discharge instructions given to you for any specific questions about what was found during the examination. If this information does not answer your questions, please call [facility] office at [phone] to clarify.   YOU SHOULD EXPECT: Some feelings of bloating in the abdomen. Passage of more gas than usual. Walking can help get rid of the air that was put into your GI tract during the procedure and reduce the bloating. If you had a lower endoscopy (such as a colonoscopy or flexible sigmoidoscopy) you may notice spotting of blood in your stool or on the toilet paper. Some abdominal soreness may be present for a day or two, also.  DIET: Your first meal following the procedure should be a light meal and then it is ok to progress to your normal diet. A half-sandwich or bowl of soup is an example of a good first meal. Heavy or fried foods are harder to digest and may make you feel nauseous or bloated. Drink plenty of fluids but you should avoid alcoholic beverages for 24 hours. If you had a esophageal dilation, please see attached instructions for diet.    ACTIVITY: Your care partner should take you home directly after the procedure. You should plan to take it easy, moving slowly for the rest of the day. You can resume normal activity the day after the procedure however YOU SHOULD NOT DRIVE, use power tools, machinery or perform tasks that involve climbing or major physical exertion for 24 hours (because of the sedation medicines used during the test).   SYMPTOMS  TO REPORT IMMEDIATELY: A gastroenterologist can be reached at any hour. Please call [phone]  for any of the following symptoms:  Following lower endoscopy (colonoscopy, flexible sigmoidoscopy) Excessive amounts of blood in the stool  Significant tenderness, worsening of abdominal pains  Swelling of the abdomen that is new, acute  Fever of 100 or higher  Following upper endoscopy (EGD, EUS, ERCP, esophageal dilation) Vomiting of blood or coffee ground material  New, significant abdominal pain  New, significant chest pain or pain under the shoulder blades  Painful or persistently difficult swallowing  New shortness of breath  Black, tarry-looking or red, bloody stools  FOLLOW UP:  If any biopsies were taken you will be contacted by phone or by letter within the next 1-3 weeks. Call [phone]  if you have not heard about the biopsies in 3 weeks.  Please also call with any specific questions about appointments or follow up tests.

## 2024-09-02 ENCOUNTER — Ambulatory Visit (HOSPITAL_COMMUNITY)
Admission: RE | Admit: 2024-09-02 | Discharge: 2024-08-31 | Disposition: A | Attending: Gastroenterology | Admitting: Gastroenterology

## 2024-09-02 ENCOUNTER — Encounter: Payer: Self-pay | Admitting: Internal Medicine

## 2024-09-02 ENCOUNTER — Ambulatory Visit: Payer: Self-pay | Admitting: Internal Medicine

## 2024-09-02 ENCOUNTER — Other Ambulatory Visit: Payer: Self-pay

## 2024-09-02 ENCOUNTER — Encounter (HOSPITAL_COMMUNITY): Admission: RE | Disposition: A | Payer: Self-pay | Attending: Gastroenterology

## 2024-09-02 VITALS — BP 108/66 | HR 77 | Temp 98.0°F | Ht 65.0 in | Wt 169.0 lb

## 2024-09-02 DIAGNOSIS — K750 Abscess of liver: Secondary | ICD-10-CM

## 2024-09-02 DIAGNOSIS — R7881 Bacteremia: Secondary | ICD-10-CM

## 2024-09-02 DIAGNOSIS — R7989 Other specified abnormal findings of blood chemistry: Secondary | ICD-10-CM

## 2024-09-02 SURGERY — ERCP, WITH INTERVENTION IF INDICATED
Anesthesia: General

## 2024-09-02 NOTE — Progress Notes (Unsigned)
 PICC Removal    PICC length & location: right basilic 37 cm Removed per verbal order from: Dr. Dennise  Blood thinners: none per chart review Platelet count: 400 (08/30/24)  Site assessment: Dressing clean and dry. Extremity warm and dry. No redness, drainage, or swelling present at insertion site.   Pre-removal vital signs:  BP: 108/70 HR: 84 SpO2: 99%  Insertion site positioned below level of heart. No sutures present. Insertion site cleaned with CHG, catheter removed and petroleum dressing applied. Tip intact. Pressure held until hemostasis achieved.    Length of catheter removed: 37 cm   Provided patient with after care instructions and precautions print out (via [employer] Key). Reviewed this information with patient.   Patient verbalized understanding and agreement, all questions answered. Patient tolerated procedure well and remained in clinic under the care of RN 30 minutes post removal.  Post-observation vital signs:  BP: 108/66 HR: 77 SpO2: 98%  Notified Holley Herring, RN with [facility] and [facility] pharmacy team of removal.  Duwaine Lowe, BSN, RN

## 2024-09-02 NOTE — Patient Instructions (Signed)
 Stop antibiotics.

## 2024-09-02 NOTE — Progress Notes (Unsigned)
 Patient: Laurie Terry  DOB: January 15, 1966 MRN: [id number] PCP: [name], MD  Referring Provider: ***  No chief complaint on file.    Patient Active Problem List   Diagnosis Date Noted   Bacteremia 08/03/2024   PICC line infection 08/03/2024   Acute bacterial endocarditis 06/09/2024   Liver abscess 06/08/2024   Temporal giant cell arteritis (HCC) 06/08/2024   Severe sepsis (HCC) 06/06/2024   PMR (polymyalgia rheumatica) 06/06/2024   Pubic bone pain 05/27/2024   Sinus bradycardia 05/26/2024   Bacteremia due to Pseudomonas 05/24/2024   Normocytic anemia 05/24/2024   Dilatation of aorta 01/24/2023   Perinephric abscess 03/03/2022   Norovirus 02/15/2022   Dry cough 02/02/2022   Iatrogenic subdiaphragmatic abscess (HCC) 02/02/2022   Pleural effusion on right    Post-ERCP acute pancreatitis 01/16/2022   GERD without esophagitis 01/16/2022   Choledocholithiasis 01/16/2022   Adverse effect of other drugs, medicaments and biological substances, subsequent encounter 05/23/2021   Other allergic rhinitis 05/23/2021   Allergic conjunctivitis of both eyes 05/23/2021   History of asthma 05/23/2021   Oral allergy syndrome, subsequent encounter 05/23/2021   Family history of bicuspid aortic valve 11/28/2020   Other hyperlipidemia 11/28/2020   PAC (premature atrial contraction) 10/30/2020   Postoperative bile leak 04/12/2016   Acute hypokalemia 04/12/2016   Paroxysmal SVT (supraventricular tachycardia) 04/12/2016   Bile leak, postoperative 04/12/2016   S/P laparoscopic cholecystectomy    History of Gallstone pancreatitis 04/07/2016   Pancreatitis due to biliary obstruction (HCC) 04/07/2016   Cancer (HCC)    Osteopenia      Subjective:  Laurie Terry is a 58 year old female with past medical history of giant cell arteritis with PMR on prednisone  started on prophylactic atovaquone , history of intra-abdominal abscesses subhepatic/perinephric cultures growing E  faecalis Candida albicans following ERCP in 2023 she was treated with Augmentin  and fluconazole , recent admission 8/27 - 9/9 with Klebsiella pneumonia bacteremia suspected secondary to GI source found to have hepatic abscess status post aspiration growing E faecium, otitis pubis discharged on amoxicillin  and cefadroxil  presents for hospital follow-up of Pseudomonas bacteremia.  She was has been 9/14 - 9/22 after presented from GI clinic with light headedness and low blood pressure.  Blood cultures grew Pseudomonas.  CT showed concern for multifocal cavitary pneumonia/septic emboli as well as hepatic abscesses.  TEE was without vegetation.  Small PFO with left-to-right shunt noted.  Hepatic abscesses appear too small in size for IR to drain at that time.  She was discharged on meropenem  and ampicillin  x 6 weeks from 9/17 EOT 10/29  L:V: notes she had some issues with abx. ,Loose stool is on and off since discharge from hospital.she has had negative c diff testing in the interim(GI office). No abd pain. She has a non productive cougt as such ct chest, abdomen, pelvis ordered.  Today: Notes the redness still [persists at picc site (about the same asa at discharge),  Some ruq pain.    ROS  Past Medical History:  Diagnosis Date   Allergic rhinitis    Aortic aneurysm 09/23/2020   Cancer (HCC)    Melanoma   Cervical dysplasia 2007   low-grade SIL, normal Pap smears since then   Family history of adverse reaction to anesthesia    mom has h/o post op nausea/vomiting   Intermittent palpitations    IUD    inserted 03/2002.04/06/2007, 06/03/2012   Ocular migraine    Osteomalacia    Pancreatitis    Seafood allergy  Outpatient Medications Prior to Visit  Medication Sig Dispense Refill   acetaminophen  (TYLENOL ) 500 MG tablet Take 1,000 mg by mouth daily as needed for mild pain (pain score 1-3) or moderate pain (pain score 4-6).     amoxicillin  (AMOXIL ) 500 MG capsule Take 2 capsules (1,000 mg total) by  mouth 3 (three) times daily. 180 capsule 2   atovaquone  (MEPRON ) 750 MG/5ML suspension Take 750 mg by mouth in the morning, at noon, and at bedtime.     COLESTID  1 g tablet Take 1-2 g by mouth as needed (diarrhea).     diclofenac  Sodium (VOLTAREN ) 1 % GEL Apply 2 g topically 4 (four) times daily. (Patient taking differently: Apply 1 Application topically daily as needed (pain).)     dicyclomine  (BENTYL ) 20 MG tablet Take 1 tablet (20 mg total) by mouth 2 (two) times daily. (Patient taking differently: Take 20 mg by mouth daily as needed for spasms.) 20 tablet 0   EPINEPHrine  0.3 mg/0.3 mL IJ SOAJ injection Inject 0.3 mg into the muscle as needed for anaphylaxis. 2 each 1   famotidine  (PEPCID ) 20 MG tablet Take 20 mg by mouth daily as needed for heartburn or indigestion.     fluticasone  (FLONASE ) 50 MCG/ACT nasal spray Place 2 sprays into both nostrils daily. (Patient taking differently: Place 2 sprays into both nostrils daily as needed for allergies.) 16 g 5   meropenem  (MERREM ) IVPB Inject 1 g into the vein every 8 (eight) hours.     predniSONE  (DELTASONE ) 5 MG tablet Take 9 mg by mouth daily with breakfast.     saccharomyces boulardii (FLORASTOR) 250 MG capsule Take 1 capsule (250 mg total) by mouth 2 (two) times daily. 60 capsule 0   No facility-administered medications prior to visit.     Allergies[1]  Social History[2]  Family History  Problem Relation Age of Onset   Tremor Mother    Stroke Father    Autoimmune disease Father    CVA Father    Breast cancer Sister 33       3X   Arthritis Sister        Psoriatic   Thyroid  disease Sister    Breast cancer Maternal Grandmother        Age 20   Thyroid  disease Daughter        Hypo   Uterine cancer Neg Hx    Bladder Cancer Neg Hx    Renal cancer Neg Hx     Objective:  There were no vitals filed for this visit. There is no height or weight on file to calculate BMI.  Physical Exam  Lab Results: Lab Results  Component Value  Date   WBC 9.1 08/30/2024   HGB 12.7 08/30/2024   HCT 38.6 08/30/2024   MCV 89.1 08/30/2024   PLT 400 08/30/2024    Lab Results  Component Value Date   CREATININE 0.65 08/30/2024   BUN 19 08/30/2024   NA 139 08/30/2024   K 4.3 08/30/2024   CL 102 08/30/2024   CO2 27 08/30/2024    Lab Results  Component Value Date   ALT 110 (H) 08/30/2024   AST 72 (H) 08/30/2024   ALKPHOS 367 (H) 08/04/2024   BILITOT 0.3 08/30/2024     Assessment & Plan:  #Pseudomonas bacteremia secondary to intra-abdominal source #History of Klebsiella bacteremia and E faecium hepatic abscesses secondary to intra-abdominal source - Patient admitted initially admitted 8/27 - 9/9 for Klebsiella bacteremia suspect secondary GI source.  She was  found to have hepatic abscesses, underwent IR aspiration with cultures growing E faecium and she was discharged Amoxident cefadroxil  - She returned from GI clinic due to low blood pressure on 9/14 found to have Pseudomonas bacteremia - CT chest abdomen pelvis showed several scattered lung nodules which were new compared to CT on 8/15.  Several scattered hypodense hepatic lesions observed, noted to have increased in size from 1.6 X1 0.4-1.9 X1 0.7 cm compared to previous MRI 9/3 previous pelvic ascites resolved - TEE without vegetation, small PFO - She was discharged on 9/22 on meropenem  ampicillin  x 6 weeks EOT 10/29. CT chest abdomen pelvis noted multiple hypodense liver lesions some of which are enlarged compared to prior examination.  Others are diminished in size.  Enlarged lesion concerning for persistent/progressive hepatic abscesses.  Consider MR enhanced contrast for further evaluation.  Previously seen bilateral cavitary pulmonary nodules are almost completely resolved. Following last visit patient underwent ultrasound-guided aspiration of right hepatic lesion, 1.5 mL fluid was aspirated that was purulent.  Cultures no growth on 11/3 - Patient also had concern for PICC  line infection which was removed and replaced.  Cultures no growth - Underwent MRI with MRCP on 11/21 as planned.  MRI showed resolving liver lesions.  Noted interval resolution of decreased size of many of the previously noted rim-enhancing fluid collections.  New scattered mild peripheral intrahepatic bile duct dilatation with a solid peribiliary fat Hanssen could be infectious versus inflammatory.  I spoke with Dr. Rosalie today.  In regards to neck steps.  Recommending ERCP.  Noted that we will continue antibiotics if ERCP is the near future.  Noted that patient has been on antibiotics x 10 weeks. Plan: - Continue antibiotics till nv on 12/11. She states she is amenable to ERCP - Patient has appointment with Dr. Rosalie tomorrow will follow-up on plan. -Notes pain gone after procudre  -f/u in 2 months  #Medication management #PICC line #Elevated LFTs 12/8 labs show AST 72, ALT 110, ALP 456 which are elevated but stable   #mild dysuria -ua with cx  #COVID+ Flu shot -discuss with rheumotlogy  Loney Stank, MD [facility]   09/02/2024  2:41 PM     [1]  Allergies Allergen Reactions   Cefepime  Cough    Patient develops cough, rash in her ears. 06/07/24 developed extreme itching with dose, tongue swelling   Contrast Media [Iodinated Contrast Media] Shortness Of Breath and Other (See Comments)    Low blood pressure    Midodrine  Shortness Of Breath   Avocado Itching and Other (See Comments)    Tongue and mouth.    Fish Allergy Hives and Swelling   Levofloxacin Hives and Other (See Comments)    Pt already has aortic aneurysm.   Pantoprazole  Other (See Comments)    Joint aches, fever, peeling of skin   Quinolones Other (See Comments)    Contraindicated due to history of Aortic Aneurysm    Shellfish Allergy Hives and Swelling   Sulfa Antibiotics Hives   Vancomycin  Rash, Other (See Comments) and Cough    Patient develops cough,  rash ears, tingling lips after receiving Cefepime  and vancomycin  01/23/2022 - Patient had an allergy test and the MD stated that it is not a real allergy and that it was just an infusion reaction    Augmentin  [Amoxicillin -Pot Clavulanate] Nausea And Vomiting  [2]  Social History Tobacco Use   Smoking status: Former    Passive exposure: Past  Smokeless tobacco: Never   Tobacco comments:    Patient said a long time ago. 07/19/2024  Vaping Use   Vaping status: Never Used  Substance Use Topics   Alcohol use: Not Currently   Drug use: No

## 2024-09-03 ENCOUNTER — Other Ambulatory Visit: Payer: Self-pay

## 2024-09-03 ENCOUNTER — Encounter (HOSPITAL_COMMUNITY): Payer: Self-pay

## 2024-09-03 ENCOUNTER — Telehealth: Payer: Self-pay

## 2024-09-03 ENCOUNTER — Emergency Department (HOSPITAL_COMMUNITY)
Admission: EM | Admit: 2024-09-03 | Discharge: 2024-09-03 | Disposition: A | Discharge status: Home / Self Care | Attending: Emergency Medicine | Admitting: Emergency Medicine

## 2024-09-03 ENCOUNTER — Emergency Department (HOSPITAL_COMMUNITY)

## 2024-09-03 DIAGNOSIS — I82A11 Acute embolism and thrombosis of right axillary vein: Secondary | ICD-10-CM | POA: Insufficient documentation

## 2024-09-03 DIAGNOSIS — Z7901 Long term (current) use of anticoagulants: Secondary | ICD-10-CM | POA: Insufficient documentation

## 2024-09-03 DIAGNOSIS — M7989 Other specified soft tissue disorders: Secondary | ICD-10-CM

## 2024-09-03 DIAGNOSIS — R079 Chest pain, unspecified: Secondary | ICD-10-CM | POA: Insufficient documentation

## 2024-09-03 LAB — BASIC METABOLIC PANEL WITH GFR
Anion gap: 9 (ref 5–15)
BUN: 13 mg/dL (ref 6–20)
CO2: 30 mmol/L (ref 22–32)
Calcium: 8.9 mg/dL (ref 8.9–10.3)
Chloride: 100 mmol/L (ref 98–111)
Creatinine, Ser: 0.73 mg/dL (ref 0.44–1.00)
GFR, Estimated: >60 mL/min (ref >60)
Glucose, Bld: 88 mg/dL (ref 70–99)
Potassium: 3.4 mmol/L — ABNORMAL LOW (ref 3.5–5.1)
Sodium: 139 mmol/L (ref 135–145)

## 2024-09-03 LAB — CBC
HCT: 39.3 % (ref 36.0–46.0)
Hemoglobin: 12.6 g/dL (ref 12.0–15.0)
MCH: 29.4 pg (ref 26.0–34.0)
MCHC: 32.1 g/dL (ref 30.0–36.0)
MCV: 91.8 fL (ref 80.0–100.0)
Platelets: 390 K/uL (ref 150–400)
RBC: 4.28 MIL/uL (ref 3.87–5.11)
RDW: 13.6 % (ref 11.5–15.5)
WBC: 7.9 K/uL (ref 4.0–10.5)
nRBC: 0 % (ref 0.0–0.2)

## 2024-09-03 LAB — TROPONIN I (HIGH SENSITIVITY): Troponin I (High Sensitivity): 4 ng/L (ref <18)

## 2024-09-03 MED ORDER — APIXABAN 5 MG PO TABS: ORAL_TABLET | ORAL | 1 refills | Status: DC

## 2024-09-03 NOTE — Discharge Instructions (Addendum)
 I have sent the medication to the pharmacy for you, it is called Eliquis, it is a blood thinner that needs to start to be taken twice a day, please use the exact instructions as given.  You will need to follow-up with the DVT clinic on Monday.  I have given a referral for this to happen.  They should contact you for follow-up.  Please continue to use the right arm but do not do any aggressive activities with the arm.  If you show develop worsening chest pain or shortness of breath return to the ER immediately  You will be called on Monday or Tuesday -

## 2024-09-03 NOTE — ED Triage Notes (Signed)
 Pt states she had a PICC line in her right upper arm removed yesterday and she woke up this morning and noticed swelling to the area and down to her hand, the swelling in her hand has improved but pt c.o tenderness to right upper arm.  Pt also c.o central chest pain that goes through to her back between her shoulder blades that started this morning, denies sob

## 2024-09-03 NOTE — ED Provider Triage Note (Signed)
 Emergency Medicine Provider Triage Evaluation Note  Laurie Terry , a 58 y.o. female  was evaluated in triage.  Pt complains of right upper extremity swelling, she has recently had a PICC line in the right upper extremity, she fell asleep last night with an Ace bandage on the right arm and when she woke this morning she felt like her right arm was more swollen than the left arm.  She took the Ace bandage off and now the swelling has gone down back to almost normal.  She was told to come in for evaluation of possible DVT.  Review of Systems  Positive: Arm swelling, fleeting chest pain which is now gone away Negative: No history of DVT, no swelling in the legs, no shortness of breath  Physical Exam  BP 129/80   Pulse 80   Temp 98 F (36.7 C)   Resp 17   Ht 1.651 m (5' 5)   Wt 74.8 kg   SpO2 99%   BMI 27.46 kg/m  Gen:   Awake, no distress   Resp:  Normal effort  MSK:   Moves extremities without difficulty, right upper extremity with possible mild asymmetry but good pulses good strength good sensation good blood flow good color Other:  Lower extremities normal, cardiac exam unremarkable  Medical Decision Making  Medically screening exam initiated at 9:48 AM.  Appropriate orders placed.  Laurie Terry was informed that the remainder of the evaluation will be completed by another provider, this initial triage assessment does not replace that evaluation, and the importance of remaining in the ED until their evaluation is complete.  EKG is totally normal, the patient has had some right upper extremity swelling after having a PICC line, I suspect the swelling is just related to the Ace bandage overnight but will get an ultrasound to make sure there is no DVT, patient agreeable, anticipate discharge if negative   [name], MD 09/03/24 [phone]

## 2024-09-03 NOTE — Telephone Encounter (Signed)
 Patient called, reports she woke up this morning with swelling below PICC site. States the insertion site was sore to touch, but that the soreness was improved this morning. Reports swelling extends down to thumb and that thumb feels numb. Per Dr. Dennise, patient will need to go to ED to rule out DVT. Relayed this to [place], she verbalized understanding.   Alexiah Koroma, BSN, RN

## 2024-09-03 NOTE — ED Provider Notes (Signed)
 [place] EMERGENCY DEPARTMENT AT [facility] Provider Note   CSN: [id number] Arrival date & time: 09/03/24  9142     Patient presents with: Arm Pain and Chest Pain   Laurie Terry is a 57 y.o. female.    Arm Pain  Chest Pain    This patient is a 58 year old female, she has a history of polymyalgia rheumatica as well as giant cell arteritis, she unfortunately had a series of infections which left her hospitalized for several weeks, she then had a PICC line placed in the right upper extremity which was finally removed yesterday after she finished her antibiotic therapy for a liver abscess.  She presents to the hospital today because she had some increasing swelling of her arm and it was recommended that she come to be checked out, again 24 hours ago was when the PICC line was removed.  She has no shortness of breath, no fever no tachycardia, oxygen is 100% on room air, fleeting intermittent mild chest pain.  No swelling of the legs, swelling of the arm has improved significantly and is now back to baseline without any visible swelling or discomfort in the arm.  Prior to Admission medications  Medication Sig Start Date End Date Taking? Authorizing Provider  apixaban (ELIQUIS) 5 MG TABS tablet Take 2 tablets (10mg ) twice daily for 7 days, then 1 tablet (5mg ) twice daily 09/03/24  Yes [name], MD  acetaminophen  (TYLENOL ) 500 MG tablet Take 1,000 mg by mouth daily as needed for mild pain (pain score 1-3) or moderate pain (pain score 4-6).    [provider]  amoxicillin  (AMOXIL ) 500 MG capsule Take 2 capsules (1,000 mg total) by mouth 3 (three) times daily. 07/19/24   Dennise Kingsley, MD  atovaquone  (MEPRON ) 750 MG/5ML suspension Take 750 mg by mouth in the morning, at noon, and at bedtime. Patient not taking: Reported on 09/02/2024    [provider]  COLESTID  1 g tablet Take 1-2 g by mouth as needed (diarrhea). 06/04/24   [provider]   diclofenac  Sodium (VOLTAREN ) 1 % GEL Apply 2 g topically 4 (four) times daily. Patient not taking: Reported on 09/02/2024 05/30/24   Vann, Jessica U, DO  dicyclomine  (BENTYL ) 20 MG tablet Take 1 tablet (20 mg total) by mouth 2 (two) times daily. Patient not taking: Reported on 09/02/2024 05/07/24   [name], MD  EPINEPHrine  0.3 mg/0.3 mL IJ SOAJ injection Inject 0.3 mg into the muscle as needed for anaphylaxis. 10/08/22   Tobie Arleta SQUIBB, MD  famotidine  (PEPCID ) 20 MG tablet Take 20 mg by mouth daily as needed for heartburn or indigestion.    [provider]  fluticasone  (FLONASE ) 50 MCG/ACT nasal spray Place 2 sprays into both nostrils daily. Patient not taking: Reported on 09/02/2024 10/08/22   Tobie Arleta SQUIBB, MD  meropenem  (MERREM ) IVPB Inject 1 g into the vein every 8 (eight) hours.    [provider]  predniSONE  (DELTASONE ) 5 MG tablet Take 9 mg by mouth daily with breakfast.    [provider]  saccharomyces boulardii (FLORASTOR) 250 MG capsule Take 1 capsule (250 mg total) by mouth 2 (two) times daily. 05/30/24   Vann, Jessica U, DO    Allergies: Cefepime , Contrast media [iodinated contrast media], Midodrine , Avocado, Fish allergy, Levofloxacin, Pantoprazole , Quinolones, Shellfish allergy, Sulfa antibiotics, Vancomycin , and Augmentin  [amoxicillin -pot clavulanate]    Review of Systems  Musculoskeletal:        Arm swelling  All other systems  reviewed and are negative.   Updated Vital Signs BP 121/82 (BP Location: Left Arm)   Pulse 78   Temp 98 F (36.7 C)   Resp 16   Ht 1.651 m (5' 5)   Wt 74.8 kg   SpO2 100%   BMI 27.46 kg/m   Physical Exam Vitals and nursing note reviewed.  Constitutional:      General: She is not in acute distress.    Appearance: She is well-developed.  HENT:     Head: Normocephalic and atraumatic.     Mouth/Throat:     Pharynx: No oropharyngeal exudate.  Eyes:     General: No scleral icterus.       Right eye: No  discharge.        Left eye: No discharge.     Conjunctiva/sclera: Conjunctivae normal.     Pupils: Pupils are equal, round, and reactive to light.  Neck:     Thyroid : No thyromegaly.     Vascular: No JVD.  Cardiovascular:     Rate and Rhythm: Normal rate and regular rhythm.     Heart sounds: Normal heart sounds. No murmur heard.    No friction rub. No gallop.  Pulmonary:     Effort: Pulmonary effort is normal. No respiratory distress.     Breath sounds: Normal breath sounds. No wheezing or rales.  Abdominal:     General: Bowel sounds are normal. There is no distension.     Palpations: Abdomen is soft. There is no mass.     Tenderness: There is no abdominal tenderness.  Musculoskeletal:        General: No tenderness. Normal range of motion.     Cervical back: Normal range of motion and neck supple.  Lymphadenopathy:     Cervical: No cervical adenopathy.  Skin:    General: Skin is warm and dry.     Findings: No erythema or rash.  Neurological:     Mental Status: She is alert.     Coordination: Coordination normal.  Psychiatric:        Behavior: Behavior normal.     (all labs ordered are listed, but only abnormal results are displayed) Labs Reviewed  BASIC METABOLIC PANEL WITH GFR - Abnormal; Notable for the following components:      Result Value   Potassium 3.4 (*)    All other components within normal limits  CBC  TROPONIN I (HIGH SENSITIVITY)  TROPONIN I (HIGH SENSITIVITY)    EKG: EKG Interpretation Date/Time:  Friday September 03 2024 09:34:33 EST Ventricular Rate:  76 PR Interval:  142 QRS Duration:  80 QT Interval:  362 QTC Calculation: 407 R Axis:   49  Text Interpretation: Normal sinus rhythm Low voltage QRS Borderline ECG When compared with ECG of 06-Jun-2024 11:14, PREVIOUS ECG IS PRESENT Confirmed by [name] ([id number]) on 09/03/2024 9:47:53 AM  Radiology: UE Venous Duplex ([facility] and [facility] ONLY) Result Date: 09/03/2024 UPPER VENOUS STUDY  Patient Name:   Laurie Terry [facility]  Date of Exam:   09/03/2024 Medical Rec #: [id number]            Accession #:    [id number] Date of Birth: [birth year]            Patient Gender: F Patient Age:   16 years Exam Location:  [facility] Procedure:      VAS US  UPPER EXTREMITY VENOUS DUPLEX Referring Phys: Ashyah Quizon --------------------------------------------------------------------------------  Indications: Swelling, and recent PICC line. Comparison Study: No prior  exam. Performing Technologist: Edilia Elden Appl  Examination Guidelines: A complete evaluation includes B-mode imaging, spectral Doppler, color Doppler, and power Doppler as needed of all accessible portions of each vessel. Bilateral testing is considered an integral part of a complete examination. Limited examinations for reoccurring indications may be performed as noted.  Right Findings: +----------+------------+---------+-----------+----------+-------+ RIGHT     CompressiblePhasicitySpontaneousPropertiesSummary +----------+------------+---------+-----------+----------+-------+ IJV           Full       Yes       Yes                      +----------+------------+---------+-----------+----------+-------+ Subclavian  Partial      Yes       Yes                      +----------+------------+---------+-----------+----------+-------+ Axillary      None       No        No                       +----------+------------+---------+-----------+----------+-------+ Brachial      Full       Yes       Yes                      +----------+------------+---------+-----------+----------+-------+ Radial        Full                                          +----------+------------+---------+-----------+----------+-------+ Ulnar         Full                                          +----------+------------+---------+-----------+----------+-------+ Cephalic      Full       Yes       Yes                       +----------+------------+---------+-----------+----------+-------+ Basilic     Partial      Yes       Yes                      +----------+------------+---------+-----------+----------+-------+ Deep vein thrombosis noted in the right axillary vein and distal subclavian vein. Superficial vein thrombosis noted in the proximal basilic vein.  Left Findings: +----------+------------+---------+-----------+----------+-------+ LEFT      CompressiblePhasicitySpontaneousPropertiesSummary +----------+------------+---------+-----------+----------+-------+ IJV           Full       Yes       Yes                      +----------+------------+---------+-----------+----------+-------+ Subclavian    Full       Yes       Yes                      +----------+------------+---------+-----------+----------+-------+  Summary:  Right: Findings consistent with age indeterminate deep vein thrombosis involving the right subclavian vein and right axillary vein.  Left: No evidence of thrombosis in the subclavian.  *See table(s) above for measurements and observations.    Preliminary      Procedures   Medications Ordered  in the ED - No data to display                                  Medical Decision Making Amount and/or Complexity of Data Reviewed Labs: ordered.  Risk Prescription drug management.   The patient has totally normal pulses in the right upper extremity, she has normal capillary refill normal sensation normal motor function,  Labs:  I  personally viewed and interpreted the labs which show  no significant abnormalities, her troponin is 4, CBC shows normal blood counts, metabolic panel shows normal renal function   Radiology Imaging: I personally viewed the images of the ordered radiographic studies and find positive findings of DVT in the right axillary and subclavian veins I agree with the radiologist interpretation as well  Consultations: I discussed the case with Dr. Serene of  the vascular service, they recommend Eliquis, DVT clinic on Monday, ambulatory referral to the DVT clinic was made  I informed the patient about all the results, she is hemodynamically stable with no chest pain at this time, no shortness of breath at this time and states that her arm is back to normal in appearance  I have discussed with the patient at the bedside the results, and the meaning of these results.  They have had opportunity to ask questions,  expressed their understanding to the need for follow-up with primary care physician      Final diagnoses:  Acute deep vein thrombosis (DVT) of axillary vein of right upper extremity [facility])    ED Discharge Orders          Ordered    apixaban (ELIQUIS) 5 MG TABS tablet        09/03/24 1508    AMB Referral to Deep Vein Thrombosis Clinic        09/03/24 1510               [name], MD 09/03/24 1527

## 2024-09-06 ENCOUNTER — Other Ambulatory Visit (HOSPITAL_COMMUNITY): Payer: Self-pay

## 2024-09-06 ENCOUNTER — Ambulatory Visit: Attending: Surgery | Admitting: Student-PharmD

## 2024-09-06 ENCOUNTER — Encounter: Payer: Self-pay | Admitting: Student-PharmD

## 2024-09-06 VITALS — BP 121/78 | HR 85

## 2024-09-06 DIAGNOSIS — I82A11 Acute embolism and thrombosis of right axillary vein: Secondary | ICD-10-CM | POA: Diagnosis not present

## 2024-09-06 MED ORDER — APIXABAN 5 MG PO TABS: 5.0000 mg | ORAL_TABLET | Freq: Two times a day (BID) | ORAL | 1 refills | Status: AC

## 2024-09-06 NOTE — Patient Instructions (Signed)
-  Continue apixaban  (Eliquis ) 10 mg twice daily for 7 days followed by 5 mg twice daily. -Your refills have been sent to your [facility]. You may need to call the pharmacy to ask them to fill this when you start to run low on your current supply.  -It is important to take your medication around the same time every day.  -Avoid NSAIDs like ibuprofen (Advil, Motrin) and naproxen (Aleve) as well as aspirin doses over 100 mg daily. -Tylenol  (acetaminophen ) is the preferred over the counter pain medication to lower the risk of bleeding. -Be sure to alert all of your health care providers that you are taking an anticoagulant prior to starting a new medication or having a procedure. -Monitor for signs and symptoms of bleeding (abnormal bruising, prolonged bleeding, nose bleeds, bleeding from gums, discolored urine, black tarry stools). If you have fallen and hit your head OR if your bleeding is severe or not stopping, seek emergency care.  -Go to the emergency room if emergent signs and symptoms of new clot occur (new or worse swelling and pain in an arm or leg, shortness of breath, chest pain, fast or irregular heartbeats, lightheadedness, dizziness, fainting, coughing up blood) or if you experience a significant color change (pale or blue) in the extremity that has the DVT.  -We recommend you wear compression stockings (20-30 mmHg) as long as you are having swelling or pain. Be sure to purchase the correct size and take them off at night.   If you have any questions or need to reschedule an appointment, please call [phone]. If you are having an emergency, call 911 or present to the nearest emergency room.   What is a DVT?  -Deep vein thrombosis (DVT) is a condition in which a blood clot forms in a vein of the deep venous system which can occur in the lower leg, thigh, pelvis, arm, or neck. This condition is serious and can be life-threatening if the clot travels to the arteries of the lungs and causing a  blockage (pulmonary embolism, PE). A DVT can also damage veins in the leg, which can lead to long-term venous disease, leg pain, swelling, discoloration, and ulcers or sores (post-thrombotic syndrome).  -Treatment may include taking an anticoagulant medication to prevent more clots from forming and the current clot from growing, wearing compression stockings, and/or surgical procedures to remove or dissolve the clot.

## 2024-09-06 NOTE — Progress Notes (Cosign Needed)
 DVT Clinic Note  Name: Laurie Terry     MRN: [id number]     DOB: 1966-07-14     Sex: female  PCP: [name], MD  Today's Visit: Visit Information: Initial Visit  Referred to DVT Clinic by: Emergency Department - Dr. Cleotilde Referred to CPP by: Dr. Serene Reason for referral:  Chief Complaint  Patient presents with   Med Management - DVT   HISTORY OF PRESENT ILLNESS: Laurie Terry is a 58 y.o. female with PMH polymyalgia rheumatica, giant cell arteritis, who presents after diagnosis of DVT for medication management. Patient recently had series of infections including bacteremia that left her hospitalized for several weeks In September and November 2025 and had a PICC line placed in the right upper extremity which was removed 09/02/24 by ID after completing antibiotic therapy for a liver abscess. She presented to the ED 09/03/24 for increased swelling of her arm. Ultrasound showed age-indeterminate DVT in the right axillary and subclavian veins. The ED consulted with Dr. Serene who recommended starting Eliquis  and seeing the patient in DVT Clinic this week. Patient endorses adherence with Eliquis  and denies any adverse effects including abnormal bleeding or bruising. Endorses symptom improvement since her ED visits. Reports mild swelling that day that has nearly resolved.   Positive Thrombotic Risk Factors: Recent admission to hospital with acute illness (within 3 months), Central venous catheterization Bleeding Risk Factors: Anticoagulant therapy  Negative Thrombotic Risk Factors: Previous VTE, Paralysis, paresis, or recent plaster cast immobilization of lower extremity, Sedentary journey lasting >8 hours within 4 weeks, Pregnancy, Within 6 weeks postpartum, Recent cesarean section (within 3 months), Estrogen therapy, Testosterone therapy, Erythropoiesis-stimulating agent, Recent COVID diagnosis (within 3 months), Active cancer, Non-malignant, chronic inflammatory condition,  Known thrombophilic condition, Smoking, Obesity, Older age  Rx Insurance Coverage: Commercial Rx Affordability: Unable to check copay since Eliquis  was just filled, but provided patient with copay card to use if needed.  Rx Assistance Provided: Co-pay card Preferred Pharmacy: Refills sent to patient's [facility].   Past Medical History:  Diagnosis Date   Allergic rhinitis    Aortic aneurysm 09/23/2020   Cancer (HCC)    Melanoma   Cervical dysplasia 2007   low-grade SIL, normal Pap smears since then   Family history of adverse reaction to anesthesia    mom has h/o post op nausea/vomiting   Intermittent palpitations    IUD    inserted 03/2002.04/06/2007, 06/03/2012   Ocular migraine    Osteomalacia    Pancreatitis    Seafood allergy     Past Surgical History:  Procedure Laterality Date   BIOPSY  01/21/2022   Procedure: BIOPSY;  Surgeon: Dianna Specking, MD;  Location: [facility];  Service: Gastroenterology;;   ROMAYNE  2018   CHOLECYSTECTOMY N/A 04/09/2016   Procedure: LAPAROSCOPIC CHOLECYSTECTOMY ;  Surgeon: Donnice Bury, MD;  Location[facility] OR;  Service: General;  Laterality: N/A;   ERCP  04/11/2016   ERCP with pancreatic stent and failed CBD stent after needle knife and conventional sphincterotomy   ERCP N/A 04/12/2016   Procedure: ENDOSCOPIC RETROGRADE CHOLANGIOPANCREATOGRAPHY (ERCP);  Surgeon: Oliva Boots, MD;  Location: [facility] END[facility]COPY;  Service: Endoscopy;  Laterality: N/A;   ERCP N/A 01/15/2022   Procedure: ENDOSCOPIC RETROGRADE CHOLANGIOPANCREATOGRAPHY (ERCP);  Surgeon: Boots Oliva, MD;  Location: [facility];  Service: Gastroenterology;  Laterality: N/A;   ERCP N/A 08/31/2024   Procedure: ERCP, WITH INTERVENTION IF INDICATED;  Surgeon: Boots Oliva, MD;  Location: [facility];  Service: Gastroenterology;  Laterality: N/A;  ESOPHAGOGASTRODUODENOSCOPY N/A 06/02/2024   Procedure: EGD (ESOPHAGOGASTRODUODENOSCOPY);  Surgeon: Burnette Fallow, MD;  Location: [facility];   Service: Gastroenterology;  Laterality: N/A;   EUS N/A 06/02/2024   Procedure: ULTRASOUND, UPPER GI TRACT, ENDOSCOPIC;  Surgeon: Burnette Fallow, MD;  Location: [facility];  Service: Gastroenterology;  Laterality: N/A;   FLEXIBLE SIGMOIDOSCOPY N/A 01/21/2022   Procedure: FLEXIBLE SIGMOIDOSCOPY;  Surgeon: Dianna Specking, MD;  Location: [facility];  Service: Gastroenterology;  Laterality: N/A;   FOOT SURGERY     toenail   INTRAUTERINE DEVICE INSERTION  08/07/2017   Mirena    IR EXCHANGE BILIARY DRAIN  02/11/2022   IR RADIOLOGIST EVAL & MGMT  02/08/2022   IR RADIOLOGIST EVAL & MGMT  02/20/2022   IR RADIOLOGIST EVAL & MGMT  02/28/2022   removal of melanoma     REMOVAL OF STONES  01/15/2022   Procedure: REMOVAL OF STONES;  Surgeon: Rosalie Kitchens, MD;  Location: [facility];  Service: Gastroenterology;;   ANNETT  01/15/2022   Procedure: ANNETT;  Surgeon: Rosalie Kitchens, MD;  Location: [facility];  Service: Gastroenterology;;   TRANSESOPHAGEAL ECHOCARDIOGRAM (CATH LAB) N/A 06/09/2024   Procedure: TRANSESOPHAGEAL ECHOCARDIOGRAM;  Surgeon: Francyne Headland, MD;  Location: [facility];  Service: Cardiovascular;  Laterality: N/A;    Social History   Socioeconomic History   Marital status: Married    Spouse name: Marcey Blumenthal   Number of children: 4   Years of education: Not on file   Highest education level: Not on file  Occupational History   Not on file  Tobacco Use   Smoking status: Former    Passive exposure: Past   Smokeless tobacco: Never   Tobacco comments:    Patient said a long time ago. 07/19/2024  Vaping Use   Vaping status: Never Used  Substance and Sexual Activity   Alcohol use: Not Currently   Drug use: No   Sexual activity: Yes    Partners: Male    Birth control/protection: Post-menopausal    Comment: des neg  Other Topics Concern   Not on file  Social History Narrative   Not on file   Social Drivers of Health   Tobacco Use: Medium Risk  (09/06/2024)   Patient History    Smoking Tobacco Use: Former    Smokeless Tobacco Use: Never    Passive Exposure: Past  Physicist, Medical Strain: Not on file  Food Insecurity: No Food Insecurity (08/03/2024)   Epic    Worried About Programme Researcher, Broadcasting/film/video in the Last Year: Never true    Ran Out of Food in the Last Year: Never true  Transportation Needs: No Transportation Needs (08/03/2024)   Epic    Lack of Transportation (Medical): No    Lack of Transportation (Non-Medical): No  Physical Activity: Not on file  Stress: Not on file  Social Connections: Moderately Integrated (05/22/2024)   Social Connection and Isolation Panel    Frequency of Communication with Friends and Family: More than three times a week    Frequency of Social Gatherings with Friends and Family: More than three times a week    Attends Religious Services: Never    Database Administrator or Organizations: Yes    Attends Engineer, Structural: More than 4 times per year    Marital Status: Married  Catering Manager Violence: Not At Risk (08/03/2024)   Epic    Fear of Current or Ex-Partner: No    Emotionally Abused: No    Physically Abused: No  Sexually Abused: No  Depression (PHQ2-9): Low Risk (10/13/2023)   Depression (PHQ2-9)    PHQ-2 Score: 0  Alcohol Screen: Not on file  Housing: Unknown (08/22/2024)   Received from [facility]   Epic    Unable to Pay for Housing in the Last Year: Not on file    Number of Times Moved in the Last Year: Not on file    At any time in the past 12 months, were you homeless or living in a shelter (including now)?: No  Utilities: Not At Risk (08/03/2024)   Epic    Threatened with loss of utilities: No  Health Literacy: Not on file    Family History  Problem Relation Age of Onset   Tremor Mother    Stroke Father    Autoimmune disease Father    CVA Father    Breast cancer Sister 31       3X   Arthritis Sister        Psoriatic   Thyroid  disease  Sister    Breast cancer Maternal Grandmother        Age 32   Thyroid  disease Daughter        Hypo   Uterine cancer Neg Hx    Bladder Cancer Neg Hx    Renal cancer Neg Hx     Allergies as of 09/06/2024 - Review Complete 09/06/2024  Allergen Reaction Noted   Cefepime  Cough 01/24/2022   Contrast media [iodinated contrast media] Shortness Of Breath and Other (See Comments) 04/12/2016   Midodrine  Shortness Of Breath 05/26/2024   Avocado Itching and Other (See Comments) 09/28/2020   Fish allergy Hives and Swelling 06/07/2024   Levofloxacin Hives and Other (See Comments) 03/06/2011   Pantoprazole  Other (See Comments) 05/17/2021   Quinolones Other (See Comments) 06/07/2024   Shellfish allergy Hives and Swelling 06/07/2024   Sulfa antibiotics Hives 05/30/2024   Vancomycin  Rash, Other (See Comments), and Cough 01/24/2022   Augmentin  [amoxicillin -pot clavulanate] Nausea And Vomiting 12/18/2022    Medications Ordered Prior to Encounter[1]  REVIEW OF SYSTEMS:  Review of Systems  Respiratory:  Negative for shortness of breath.   Cardiovascular:  Negative for chest pain and palpitations.  Neurological:  Negative for dizziness and tingling.   PHYSICAL EXAMINATION:  Vitals:   09/06/24 0931  BP: 121/78  Pulse: 85  SpO2: 99%   Physical Exam Vitals reviewed.  Cardiovascular:     Rate and Rhythm: Normal rate.  Pulmonary:     Effort: Pulmonary effort is normal.  Musculoskeletal:        General: No swelling or tenderness.  Skin:    Findings: No bruising or erythema.  Psychiatric:        Mood and Affect: Mood normal.        Behavior: Behavior normal.        Thought Content: Thought content normal.   LABS:  CBC     Component Value Date/Time   WBC 7.9 09/03/2024 0922   RBC 4.28 09/03/2024 0922   HGB 12.6 09/03/2024 0922   HCT 39.3 09/03/2024 0922   PLT 390 09/03/2024 0922   MCV 91.8 09/03/2024 0922   MCH 29.4 09/03/2024 0922   MCHC 32.1 09/03/2024 0922   RDW 13.6 09/03/2024  0922   LYMPHSABS 0.8 08/03/2024 1420   MONOABS 0.4 08/03/2024 1420   EOSABS 82 08/30/2024 1501   BASOSABS 82 08/30/2024 1501    Hepatic Function      Component Value Date/Time   PROT 6.8  08/30/2024 1501   ALBUMIN 2.9 (L) 08/04/2024 0422   AST 72 (H) 08/30/2024 1501   ALT 110 (H) 08/30/2024 1501   ALKPHOS 367 (H) 08/04/2024 0422   BILITOT 0.3 08/30/2024 1501   BILIDIR 0.1 01/24/2022 0405   IBILI 0.3 01/24/2022 0405    Renal Function   Lab Results  Component Value Date   CREATININE 0.73 09/03/2024   CREATININE 0.65 08/30/2024   CREATININE 0.61 08/04/2024    Estimated Creatinine Clearance: 76.6 mL/min (by C-G formula based on SCr of 0.73 mg/dL).   [facility] Vascular Lab Studies:  09/03/24 VAS US  UPPER EXTREMITY VENOUS DUPLEX RIGHT Summary:  Right:  Findings consistent with age indeterminate deep vein thrombosis involving  the right subclavian vein and right axillary vein.    Left:  No evidence of thrombosis in the subclavian.   ASSESSMENT: Location of DVT: Right upper extremity Cause of DVT: provoked by a transient risk factor  Patient without prior history of DVT diagnosed with age-indeterminate DVT in the right subclavian vein and axillary vein. DVT provoked by recent PICC line, which was removed 09/02/24 at the time of antibiotic completion. Symptoms of swelling have already improved. No visible RUE swelling on exam. She is tolerating Eliquis  well. Discussed with Dr. Serene in clinic today, and no role for intervention at this time. Continue Eliquis  for 3 months. No role for repeat imaging. Counseled patient extensively on Eliquis , and all questions have been answered at this time.   PLAN: -Continue apixaban  (Eliquis ) 10 mg twice daily for 7 days followed by 5 mg twice daily. -Expected duration of therapy: 3 months. Therapy started on 09/06/24. -Patient educated on purpose, proper use and potential adverse effects of apixaban  (Eliquis ). -Discussed importance of taking  medication around the same time every day. -Advised patient of medications to avoid (NSAIDs, aspirin doses >100 mg daily). -Educated that Tylenol  (acetaminophen ) is the preferred analgesic to lower the risk of bleeding. -Advised patient to alert all providers of anticoagulation therapy prior to starting a new medication or having a procedure. -Emphasized importance of monitoring for signs and symptoms of bleeding (abnormal bruising, prolonged bleeding, nose bleeds, bleeding from gums, discolored urine, black tarry stools). -Educated patient to present to the ED if emergent signs and symptoms of new thrombosis occur.  Follow up: DVT Clinic as needed  Lum Herald, PharmD, [place], CPP [facility] [phone]  I have evaluated the patient's chart/imaging and refer this patient to the Clinical Pharmacist Practitioner for medication management. I have reviewed the CPP's documentation and agree with her assessment and plan. I was immediately available during the visit for questions and collaboration.   Malvina Serene, MD     [1]  Current Outpatient Medications on File Prior to Visit  Medication Sig Dispense Refill   acetaminophen  (TYLENOL ) 500 MG tablet Take 1,000 mg by mouth daily as needed for mild pain (pain score 1-3) or moderate pain (pain score 4-6).     apixaban  (ELIQUIS ) 5 MG TABS tablet Take 2 tablets (10mg ) twice daily for 7 days, then 1 tablet (5mg ) twice daily 60 tablet 1   COLESTID  1 g tablet Take 1-2 g by mouth as needed (diarrhea).     famotidine  (PEPCID ) 20 MG tablet Take 20 mg by mouth daily as needed for heartburn or indigestion.     predniSONE  (DELTASONE ) 5 MG tablet Take 9 mg by mouth daily with breakfast.     saccharomyces boulardii (FLORASTOR) 250 MG capsule Take 1 capsule (250  mg total) by mouth 2 (two) times daily. 60 capsule 0   EPINEPHrine  0.3 mg/0.3 mL IJ SOAJ injection Inject 0.3 mg into the muscle as needed for  anaphylaxis. 2 each 1   No current facility-administered medications on file prior to visit.

## 2024-09-07 ENCOUNTER — Telehealth: Payer: Self-pay | Admitting: Student-PharmD

## 2024-09-07 NOTE — Telephone Encounter (Signed)
 Patient called reporting feeling a fluttering feeling in her arm around the site of her DVT and is wondering if this is something to be concerned about. Explained that many patients report this feeling and would not consider this to be a problem unless accompanied by new symptoms (new swelling or pain). Denies any changes in symptoms, and the fluttering resolved after a few seconds. Continue current plan.

## 2024-09-10 ENCOUNTER — Telehealth: Payer: Self-pay

## 2024-09-10 NOTE — Telephone Encounter (Signed)
 Received voicemail from nurse with [facility], was not able to hear her name and no call back number left.   She wanted to know if she needed to draw labs on [place] before discharging her. Patient's OPAT ended 12/11 when PICC line was pulled in office, no additional lab orders past end date.   Called [facility], Tinelle states [name] is the nurse working with patient (cell [phone])  Spoke with Jenna, relayed no additional labs needed as patient ended OPAT 12/11.   Virgilio Broadhead, BSN, RN

## 2024-09-27 ENCOUNTER — Encounter: Payer: Self-pay | Admitting: Internal Medicine

## 2024-09-27 DIAGNOSIS — I82A11 Acute embolism and thrombosis of right axillary vein: Secondary | ICD-10-CM

## 2024-09-27 NOTE — Telephone Encounter (Signed)
 Rollene, can you tell if [facility] was notified of referral?

## 2024-09-27 NOTE — Telephone Encounter (Signed)
 Patient called reporting worsening/new UE symptoms in the arm that has the DVT. Patient denies missed doses of Eliquis . Discussed with Dr. Serene. Will order repeat ultrasound to confirm no clot propagation. If no propagation, will continue current treatment. This will be scheduled this week and I'll follow the results.

## 2024-09-28 ENCOUNTER — Ambulatory Visit (HOSPITAL_COMMUNITY)
Admission: RE | Admit: 2024-09-28 | Discharge: 2024-09-28 | Disposition: A | Discharge status: Home / Self Care | Source: Ambulatory Visit | Attending: Surgery | Admitting: Surgery

## 2024-09-28 DIAGNOSIS — I82A11 Acute embolism and thrombosis of right axillary vein: Secondary | ICD-10-CM | POA: Insufficient documentation

## 2024-10-04 ENCOUNTER — Telehealth: Payer: Self-pay | Admitting: Student-PharmD

## 2024-10-04 ENCOUNTER — Encounter: Payer: Self-pay | Admitting: *Deleted

## 2024-10-04 NOTE — Telephone Encounter (Signed)
 Called patient to answer all questions from her MyChart message from today. No further questions at this time.

## 2024-10-19 NOTE — Progress Notes (Unsigned)
 "  [name] with increased high for breast cancer (24%) here for annual exam. Married. Works in [employer]. PCP: [name], MD   No LMP recorded. Patient is postmenopausal.  She wants to discuss MRI breast screening due to family hx of BC. She was diagnosed with PMR and GCA over the past year and is on steroids. She had had several hospitalizations due to sepsis and DVT. She is being followed by rheumatology.  Fecal incontinence and OAB- symptoms are stable.  Urine sample provided: No  Abnormal bleeding: none Pelvic discharge or pain: none Breast mass, nipple discharge or skin changes : none  Sexually active: Yes  Last PAP:     Component Value Date/Time   DIAGPAP  10/13/2023 0834    - Negative for intraepithelial lesion or malignancy (NILM)   HPVHIGH Negative 10/13/2023 0834   ADEQPAP  10/13/2023 0834    Satisfactory for evaluation. The presence or absence of an   ADEQPAP  10/13/2023 0834    endocervical/transformation zone component cannot be determined because   ADEQPAP of atrophy. 10/13/2023 0834   Last mammogram: 04/30/24 BIRADS 1, density B Last colonoscopy:  9 years ago per pt  Exercising: No Smoker: No  Flowsheet Row Office Visit from 10/21/2024 in [facility]  PHQ-2 Total Score 0        OB History  Gravida Para Term Preterm AB Living  4 4 4   4   SAB IAB Ectopic Multiple Live Births          # Outcome Date GA Lbr Len/2nd Weight Sex Type Anes PTL Lv  4 Term      Vag-Spont     3 Term      Vag-Spont     2 Term      Vag-Spont     1 Term      Vag-Spont      Past Medical History:  Diagnosis Date   Allergic rhinitis    Aortic aneurysm 09/23/2020   Cancer (HCC)    Melanoma   Cervical dysplasia 2007   low-grade SIL, normal Pap smears since then   Family history of adverse reaction to anesthesia    mom has h/o post op nausea/vomiting   Intermittent palpitations    IUD    inserted 03/2002.04/06/2007, 06/03/2012    Ocular migraine    Osteomalacia    Pancreatitis    Seafood allergy    Past Surgical History:  Procedure Laterality Date   BIOPSY  01/21/2022   Procedure: BIOPSY;  Surgeon: Dianna Specking, MD;  Location: [facility];  Service: Gastroenterology;;   ROMAYNE  2018   CHOLECYSTECTOMY N/A 04/09/2016   Procedure: LAPAROSCOPIC CHOLECYSTECTOMY ;  Surgeon: Donnice Bury, MD;  Location: [facility] OR;  Service: General;  Laterality: N/A;   ERCP  04/11/2016   ERCP with pancreatic stent and failed CBD stent after needle knife and conventional sphincterotomy   ERCP N/A 04/12/2016   Procedure: ENDOSCOPIC RETROGRADE CHOLANGIOPANCREATOGRAPHY (ERCP);  Surgeon: Oliva Boots, MD;  Location: [facility] ENDOSCOPY;  Service: Endoscopy;  Laterality: N/A;   ERCP N/A 01/15/2022   Procedure: ENDOSCOPIC RETROGRADE CHOLANGIOPANCREATOGRAPHY (ERCP);  Surgeon: Boots Oliva, MD;  Location: [facility];  Service: Gastroenterology;  Laterality: N/A;   ERCP N/A 08/31/2024   Procedure: ERCP, WITH INTERVENTION IF INDICATED;  Surgeon: Boots Oliva, MD;  Location: [facility];  Service: Gastroenterology;  Laterality: N/A;   ESOPHAGOGASTRODUODENOSCOPY N/A 06/02/2024   Procedure: EGD (ESOPHAGOGASTRODUODENOSCOPY);  Surgeon: Burnette Fallow, MD;  Location: [facility];  Service: Gastroenterology;  Laterality: N/A;   EUS N/A 06/02/2024   Procedure: ULTRASOUND, UPPER GI TRACT, ENDOSCOPIC;  Surgeon: Burnette Fallow, MD;  Location: [facility];  Service: Gastroenterology;  Laterality: N/A;   FLEXIBLE SIGMOIDOSCOPY N/A 01/21/2022   Procedure: FLEXIBLE SIGMOIDOSCOPY;  Surgeon: Dianna Specking, MD;  Location: [facility];  Service: Gastroenterology;  Laterality: N/A;   FOOT SURGERY     toenail   INTRAUTERINE DEVICE INSERTION  08/07/2017   Mirena    IR EXCHANGE BILIARY DRAIN  02/11/2022   IR RADIOLOGIST EVAL & MGMT  02/08/2022   IR RADIOLOGIST EVAL & MGMT  02/20/2022   IR RADIOLOGIST EVAL & MGMT  02/28/2022   removal of melanoma     REMOVAL OF  STONES  01/15/2022   Procedure: REMOVAL OF STONES;  Surgeon: Rosalie Kitchens, MD;  Location: [facility];  Service: Gastroenterology;;   ANNETT  01/15/2022   Procedure: ANNETT;  Surgeon: Rosalie Kitchens, MD;  Location: [facility];  Service: Gastroenterology;;   TRANSESOPHAGEAL ECHOCARDIOGRAM (CATH LAB) N/A 06/09/2024   Procedure: TRANSESOPHAGEAL ECHOCARDIOGRAM;  Surgeon: Francyne Headland, MD;  Location: [facility];  Service: Cardiovascular;  Laterality: N/A;   Medications Ordered Prior to Encounter[1] Social History   Socioeconomic History   Marital status: Married    Spouse name: Marcey Blumenthal   Number of children: 4   Years of education: Not on file   Highest education level: Not on file  Occupational History   Not on file  Tobacco Use   Smoking status: Former    Passive exposure: Past   Smokeless tobacco: Never   Tobacco comments:    Patient said a long time ago. 07/19/2024  Vaping Use   Vaping status: Never Used  Substance and Sexual Activity   Alcohol use: Not Currently   Drug use: No   Sexual activity: Yes    Partners: Male    Birth control/protection: Post-menopausal    Comment: des neg  Other Topics Concern   Not on file  Social History Narrative   Not on file   Social Drivers of Health   Tobacco Use: Medium Risk (10/21/2024)   Patient History    Smoking Tobacco Use: Former    Smokeless Tobacco Use: Never    Passive Exposure: Past  Physicist, Medical Strain: Not on file  Food Insecurity: No Food Insecurity (08/03/2024)   Epic    Worried About Programme Researcher, Broadcasting/film/video in the Last Year: Never true    Ran Out of Food in the Last Year: Never true  Transportation Needs: No Transportation Needs (08/03/2024)   Epic    Lack of Transportation (Medical): No    Lack of Transportation (Non-Medical): No  Physical Activity: Not on file  Stress: Not on file  Social Connections: Moderately Integrated (05/22/2024)   Social Connection and Isolation Panel     Frequency of Communication with Friends and Family: More than three times a week    Frequency of Social Gatherings with Friends and Family: More than three times a week    Attends Religious Services: Never    Database Administrator or Organizations: Yes    Attends Banker Meetings: More than 4 times per year    Marital Status: Married  Catering Manager Violence: Not At Risk (08/03/2024)   Epic    Fear of Current or Ex-Partner: No    Emotionally Abused: No    Physically Abused: No    Sexually Abused: No  Depression (PHQ2-9): Low Risk (10/21/2024)  Depression (PHQ2-9)    PHQ-2 Score: 0  Alcohol Screen: Not on file  Housing: Unknown (08/22/2024)   Received from [facility]   Epic    Unable to Pay for Housing in the Last Year: Not on file    Number of Times Moved in the Last Year: Not on file    At any time in the past 12 months, were you homeless or living in a shelter (including now)?: No  Utilities: Not At Risk (08/03/2024)   Epic    Threatened with loss of utilities: No  Health Literacy: Not on file   Family History  Problem Relation Age of Onset   Tremor Mother    Stroke Father    Autoimmune disease Father    CVA Father    Breast cancer Sister 32       3X   Arthritis Sister        Psoriatic   Thyroid  disease Sister    Breast cancer Maternal Grandmother        Age 42   Thyroid  disease Daughter        Hypo   Uterine cancer Neg Hx    Bladder Cancer Neg Hx    Renal cancer Neg Hx    Allergies[2]   PE Today's Vitals   10/21/24 1540  BP: 106/74  Pulse: 78  Temp: 97.9 F (36.6 C)  TempSrc: Oral  SpO2: 98%  Weight: 165 lb (74.8 kg)  Height: 5' 5.25 (1.657 m)   Body mass index is 27.25 kg/m.  Physical Exam Vitals reviewed. Exam conducted with a chaperone present.  Constitutional:      General: She is not in acute distress.    Appearance: Normal appearance.  HENT:     Head: Normocephalic and atraumatic.     Nose: Nose normal.   Eyes:     Extraocular Movements: Extraocular movements intact.     Conjunctiva/sclera: Conjunctivae normal.  Pulmonary:     Effort: Pulmonary effort is normal.  Chest:     Chest wall: No mass or tenderness.  Breasts:    Right: Normal. No swelling, mass, nipple discharge, skin change or tenderness.     Left: Normal. No swelling, mass, nipple discharge, skin change or tenderness.  Abdominal:     General: There is no distension.     Palpations: Abdomen is soft.     Tenderness: There is no abdominal tenderness.  Genitourinary:    General: Normal vulva.     Exam position: Lithotomy position.     Urethra: No prolapse.     Vagina: Normal. No vaginal discharge or bleeding.     Cervix: Normal. No lesion.     Uterus: Normal. Not enlarged and not tender.      Adnexa: Right adnexa normal and left adnexa normal.  Musculoskeletal:        General: Normal range of motion.     Cervical back: Normal range of motion.  Lymphadenopathy:     Upper Body:     Right upper body: No axillary adenopathy.     Left upper body: No axillary adenopathy.     Lower Body: No right inguinal adenopathy. No left inguinal adenopathy.  Skin:    General: Skin is warm and dry.  Neurological:     General: No focal deficit present.     Mental Status: She is alert.  Psychiatric:        Mood and Affect: Mood normal.        Behavior:  Behavior normal.       Assessment and Plan:        Well woman exam with routine gynecological exam Cervical cancer screening performed according to ASCCP guidelines. Encouraged annual mammogram screening, MRI ordered Colonoscopy UTD  Labs and immunizations with her primary Encouraged safe sexual practices as indicated Encouraged healthy lifestyle practices with diet and exercise For patients under 50-70yo, I recommend 1200mg  calcium daily and 600IU of vitamin D  daily.   Increased risk of breast cancer -     MR BREAST BILATERAL W WO CONTRAST INC CAD; Future  Negative  depression screening   Laurie LULLA Pa, MD       [1]  Current Outpatient Medications on File Prior to Visit  Medication Sig Dispense Refill   acetaminophen  (TYLENOL ) 500 MG tablet Take 1,000 mg by mouth daily as needed for mild pain (pain score 1-3) or moderate pain (pain score 4-6).     apixaban  (ELIQUIS ) 5 MG TABS tablet Take 1 tablet (5 mg total) by mouth 2 (two) times daily. Start taking after completion of starter pack. 60 tablet 1   EPINEPHrine  0.3 mg/0.3 mL IJ SOAJ injection Inject 0.3 mg into the muscle as needed for anaphylaxis. 2 each 1   estradiol  (ESTRACE ) 0.01 % CREA vaginal cream Place vaginally.     famotidine  (PEPCID ) 20 MG tablet Take 20 mg by mouth daily as needed for heartburn or indigestion.     KLAYESTA  powder Apply 1 Application topically 2 (two) times daily.     predniSONE  (DELTASONE ) 5 MG tablet Take 9 mg by mouth daily with breakfast.     saccharomyces boulardii (FLORASTOR) 250 MG capsule Take 1 capsule (250 mg total) by mouth 2 (two) times daily. 60 capsule 0   No current facility-administered medications on file prior to visit.  [2]  Allergies Allergen Reactions   Cefepime  Cough    Patient develops cough, rash in her ears. 06/07/24 developed extreme itching with dose, tongue swelling   Contrast Media [Iodinated Contrast Media] Shortness Of Breath and Other (See Comments)    Low blood pressure    Midodrine  Shortness Of Breath   Avocado Itching and Other (See Comments)    Tongue and mouth.    Fish Allergy Hives and Swelling   Levofloxacin Hives and Other (See Comments)    Pt already has aortic aneurysm.   Pantoprazole  Other (See Comments)    Joint aches, fever, peeling of skin   Quinolones Other (See Comments)    Contraindicated due to history of Aortic Aneurysm    Shellfish Allergy Hives and Swelling   Sulfa Antibiotics Hives   Vancomycin  Rash, Other (See Comments) and Cough    Patient develops cough, rash ears, tingling lips after receiving Cefepime   and vancomycin  01/23/2022 - Patient had an allergy test and the MD stated that it is not a real allergy and that it was just an infusion reaction    Augmentin  [Amoxicillin -Pot Clavulanate] Nausea And Vomiting   "

## 2024-10-21 ENCOUNTER — Ambulatory Visit: Admitting: Obstetrics and Gynecology

## 2024-10-21 ENCOUNTER — Encounter: Payer: Self-pay | Admitting: Obstetrics and Gynecology

## 2024-10-21 VITALS — BP 106/74 | HR 78 | Temp 97.9°F | Ht 65.25 in | Wt 165.0 lb

## 2024-10-21 DIAGNOSIS — Z01419 Encounter for gynecological examination (general) (routine) without abnormal findings: Secondary | ICD-10-CM | POA: Diagnosis not present

## 2024-10-21 DIAGNOSIS — Z1331 Encounter for screening for depression: Secondary | ICD-10-CM

## 2024-10-21 DIAGNOSIS — Z9189 Other specified personal risk factors, not elsewhere classified: Secondary | ICD-10-CM

## 2024-10-21 NOTE — Patient Instructions (Signed)
 For patients under 50-60yo, I recommend 1200mg  calcium  daily and 600IU of vitamin D daily. For patients over 60yo, I recommend 1200mg  calcium  daily and 800IU of vitamin D daily.  Health Maintenance, Female Adopting a healthy lifestyle and getting preventive care are important in promoting health and wellness. Ask your health care provider about: The right schedule for you to have regular tests and exams. Things you can do on your own to prevent diseases and keep yourself healthy. What should I know about diet, weight, and exercise? Eat a healthy diet  Eat a diet that includes plenty of vegetables, fruits, low-fat dairy products, and lean protein. Do not eat a lot of foods that are high in solid fats, added sugars, or sodium. Maintain a healthy weight Body mass index (BMI) is used to identify weight problems. It estimates body fat based on height and weight. Your health care provider can help determine your BMI and help you achieve or maintain a healthy weight. Get regular exercise Get regular exercise. This is one of the most important things you can do for your health. Most adults should: Exercise for at least 150 minutes each week. The exercise should increase your heart rate and make you sweat (moderate-intensity exercise). Do strengthening exercises at least twice a week. This is in addition to the moderate-intensity exercise. Spend less time sitting. Even light physical activity can be beneficial. Watch cholesterol and blood lipids Have your blood tested for lipids and cholesterol at 60 years of age, then have this test every 5 years. Have your cholesterol levels checked more often if: Your lipid or cholesterol levels are high. You are older than 60 years of age. You are at high risk for heart disease. What should I know about cancer screening? Depending on your health history and family history, you may need to have cancer screening at various ages. This may include screening  for: Breast cancer. Cervical cancer. Colorectal cancer. Skin cancer. Lung cancer. What should I know about heart disease, diabetes, and high blood pressure? Blood pressure and heart disease High blood pressure causes heart disease and increases the risk of stroke. This is more likely to develop in people who have high blood pressure readings or are overweight. Have your blood pressure checked: Every 3-5 years if you are 25-57 years of age. Every year if you are 24 years old or older. Diabetes Have regular diabetes screenings. This checks your fasting blood sugar level. Have the screening done: Once every three years after age 62 if you are at a normal weight and have a low risk for diabetes. More often and at a younger age if you are overweight or have a high risk for diabetes. What should I know about preventing infection? Hepatitis B If you have a higher risk for hepatitis B, you should be screened for this virus. Talk with your health care provider to find out if you are at risk for hepatitis B infection. Hepatitis C Testing is recommended for: Everyone born from [birth year] through 1965. Anyone with known risk factors for hepatitis C. Sexually transmitted infections (STIs) Get screened for STIs, including gonorrhea and chlamydia, if: You are sexually active and are younger than 60 years of age. You are older than 60 years of age and your health care provider tells you that you are at risk for this type of infection. Your sexual activity has changed since you were last screened, and you are at increased risk for chlamydia or gonorrhea. Ask your health care provider if  you are at risk. Ask your health care provider about whether you are at high risk for HIV. Your health care provider may recommend a prescription medicine to help prevent HIV infection. If you choose to take medicine to prevent HIV, you should first get tested for HIV. You should then be tested every 3 months for as long as you  are taking the medicine. Osteoporosis and menopause Osteoporosis is a disease in which the bones lose minerals and strength with aging. This can result in bone fractures. If you are 72 years old or older, or if you are at risk for osteoporosis and fractures, ask your health care provider if you should: Be screened for bone loss. Take a calcium  or vitamin D supplement to lower your risk of fractures. Be given hormone replacement therapy (HRT) to treat symptoms of menopause. Follow these instructions at home: Alcohol use Do not drink alcohol if: Your health care provider tells you not to drink. You are pregnant, may be pregnant, or are planning to become pregnant. If you drink alcohol: Limit how much you have to: 0-1 drink a day. Know how much alcohol is in your drink. In the [place] one drink equals one 12 oz bottle of beer (355 mL), one 5 oz glass of wine (148 mL), or one 1 oz glass of hard liquor (44 mL). Lifestyle Do not use any products that contain nicotine or tobacco. These products include cigarettes, chewing tobacco, and vaping devices, such as e-cigarettes. If you need help quitting, ask your health care provider. Do not use street drugs. Do not share needles. Ask your health care provider for help if you need support or information about quitting drugs. General instructions Schedule regular health, dental, and eye exams. Stay current with your vaccines. Tell your health care provider if: You often feel depressed. You have ever been abused or do not feel safe at home. Summary Adopting a healthy lifestyle and getting preventive care are important in promoting health and wellness. Follow your health care provider's instructions about healthy diet, exercising, and getting tested or screened for diseases. Follow your health care provider's instructions on monitoring your cholesterol and blood pressure. This information is not intended to replace advice given to you by your health  care provider. Make sure you discuss any questions you have with your health care provider. Document Revised: 01/29/2021 Document Reviewed: 01/29/2021 Elsevier Patient Education  2024 [employer].

## 2024-10-25 ENCOUNTER — Telehealth: Payer: Self-pay | Admitting: Internal Medicine

## 2024-10-25 ENCOUNTER — Ambulatory Visit: Payer: Self-pay | Admitting: Internal Medicine

## 2024-10-25 NOTE — Telephone Encounter (Addendum)
 Reviewed labs from 10/05/24: ALP 339, AST 55, alt 76 which are trending down. Plan:  continue to follow with GI. Communicated with Dr. Rosalie, plan to do repeat imaging in February. Please move ID appt to March. I think she would benefit from ID care at tertiary center given complexity of medical conditions. Will follow up on referral to tertiary center in the interim.

## 2024-10-26 ENCOUNTER — Other Ambulatory Visit: Payer: Self-pay

## 2024-10-26 DIAGNOSIS — K750 Abscess of liver: Secondary | ICD-10-CM

## 2024-10-28 ENCOUNTER — Other Ambulatory Visit (HOSPITAL_COMMUNITY): Payer: Self-pay | Admitting: Family Medicine

## 2024-10-28 ENCOUNTER — Ambulatory Visit (HOSPITAL_COMMUNITY): Admission: RE | Admit: 2024-10-28 | Discharge: 2024-10-28 | Attending: Vascular Surgery

## 2024-10-28 DIAGNOSIS — R52 Pain, unspecified: Secondary | ICD-10-CM | POA: Diagnosis not present

## 2024-10-29 ENCOUNTER — Ambulatory Visit: Admitting: Internal Medicine

## 2024-11-02 ENCOUNTER — Ambulatory Visit

## 2024-11-08 ENCOUNTER — Other Ambulatory Visit

## 2024-11-17 ENCOUNTER — Ambulatory Visit

## 2024-11-23 ENCOUNTER — Other Ambulatory Visit (HOSPITAL_COMMUNITY)

## 2024-11-29 ENCOUNTER — Ambulatory Visit: Admitting: Student-PharmD

## 2024-12-02 ENCOUNTER — Ambulatory Visit: Payer: Self-pay | Admitting: Internal Medicine

## 2024-12-02 ENCOUNTER — Other Ambulatory Visit

## 2024-12-06 ENCOUNTER — Ambulatory Visit: Admitting: Student-PharmD

## 2024-12-31 ENCOUNTER — Ambulatory Visit

## 2025-01-05 ENCOUNTER — Ambulatory Visit: Admitting: Internal Medicine

## 2025-01-07 ENCOUNTER — Ambulatory Visit: Admitting: Internal Medicine

## 2025-10-31 ENCOUNTER — Ambulatory Visit: Admitting: Obstetrics and Gynecology
